# Patient Record
Sex: Female | Born: 1965 | Race: Black or African American | Hispanic: No | Marital: Married | Smoking: Never smoker
Health system: Southern US, Community
[De-identification: ages and names within clinical notes are randomized; demographics above are authoritative.]

## PROBLEM LIST (undated history)

## (undated) DIAGNOSIS — I10 Essential (primary) hypertension: Secondary | ICD-10-CM

## (undated) DIAGNOSIS — Z973 Presence of spectacles and contact lenses: Secondary | ICD-10-CM

## (undated) DIAGNOSIS — Z9289 Personal history of other medical treatment: Secondary | ICD-10-CM

## (undated) DIAGNOSIS — D649 Anemia, unspecified: Secondary | ICD-10-CM

## (undated) DIAGNOSIS — Z860100 Personal history of colon polyps, unspecified: Secondary | ICD-10-CM

## (undated) DIAGNOSIS — G709 Myoneural disorder, unspecified: Secondary | ICD-10-CM

## (undated) DIAGNOSIS — C50912 Malignant neoplasm of unspecified site of left female breast: Secondary | ICD-10-CM

## (undated) DIAGNOSIS — I701 Atherosclerosis of renal artery: Secondary | ICD-10-CM

## (undated) HISTORY — PX: TUBAL LIGATION: SHX77

## (undated) HISTORY — DX: Myoneural disorder, unspecified: G70.9

## (undated) HISTORY — PX: BREAST SURGERY: SHX581

## (undated) HISTORY — PX: COLONOSCOPY: SHX5424

## (undated) HISTORY — PX: COLONOSCOPY: SHX174

---
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
## 1999-06-10 ENCOUNTER — Other Ambulatory Visit: Admission: RE | Admit: 1999-06-10 | Discharge: 1999-06-10 | Payer: Self-pay | Admitting: Gynecology

## 1999-10-21 ENCOUNTER — Inpatient Hospital Stay (HOSPITAL_COMMUNITY): Admission: AD | Admit: 1999-10-21 | Discharge: 1999-10-24 | Payer: Self-pay | Admitting: Gynecology

## 1999-10-21 ENCOUNTER — Encounter (INDEPENDENT_AMBULATORY_CARE_PROVIDER_SITE_OTHER): Payer: Self-pay

## 1999-10-25 ENCOUNTER — Encounter: Admission: RE | Admit: 1999-10-25 | Discharge: 2000-01-23 | Payer: Self-pay | Admitting: Gynecology

## 1999-12-09 ENCOUNTER — Other Ambulatory Visit: Admission: RE | Admit: 1999-12-09 | Discharge: 1999-12-09 | Payer: Self-pay | Admitting: Gynecology

## 2000-01-24 ENCOUNTER — Encounter: Admission: RE | Admit: 2000-01-24 | Discharge: 2000-02-05 | Payer: Self-pay | Admitting: Gynecology

## 2000-05-19 ENCOUNTER — Encounter: Payer: Self-pay | Admitting: Emergency Medicine

## 2000-05-19 ENCOUNTER — Emergency Department (HOSPITAL_COMMUNITY): Admission: EM | Admit: 2000-05-19 | Discharge: 2000-05-19 | Payer: Self-pay | Admitting: Emergency Medicine

## 2000-09-24 ENCOUNTER — Emergency Department (HOSPITAL_COMMUNITY): Admission: EM | Admit: 2000-09-24 | Discharge: 2000-09-24 | Payer: Self-pay | Admitting: Emergency Medicine

## 2000-10-22 ENCOUNTER — Emergency Department (HOSPITAL_COMMUNITY): Admission: EM | Admit: 2000-10-22 | Discharge: 2000-10-22 | Payer: Self-pay | Admitting: Emergency Medicine

## 2000-11-30 ENCOUNTER — Other Ambulatory Visit: Admission: RE | Admit: 2000-11-30 | Discharge: 2000-11-30 | Payer: Self-pay | Admitting: Family Medicine

## 2000-12-22 ENCOUNTER — Emergency Department (HOSPITAL_COMMUNITY): Admission: EM | Admit: 2000-12-22 | Discharge: 2000-12-22 | Payer: Self-pay | Admitting: Emergency Medicine

## 2002-06-20 ENCOUNTER — Encounter: Payer: Self-pay | Admitting: Family Medicine

## 2002-06-20 ENCOUNTER — Encounter: Admission: RE | Admit: 2002-06-20 | Discharge: 2002-06-20 | Payer: Self-pay | Admitting: Family Medicine

## 2002-07-09 ENCOUNTER — Encounter: Payer: Self-pay | Admitting: Family Medicine

## 2002-07-09 ENCOUNTER — Encounter: Admission: RE | Admit: 2002-07-09 | Discharge: 2002-07-09 | Payer: Self-pay | Admitting: Family Medicine

## 2002-07-26 DIAGNOSIS — Z853 Personal history of malignant neoplasm of breast: Secondary | ICD-10-CM

## 2002-07-26 DIAGNOSIS — Z9289 Personal history of other medical treatment: Secondary | ICD-10-CM

## 2002-07-26 HISTORY — PX: MASTECTOMY: SHX3

## 2002-07-26 HISTORY — DX: Personal history of other medical treatment: Z92.89

## 2002-10-05 ENCOUNTER — Encounter (INDEPENDENT_AMBULATORY_CARE_PROVIDER_SITE_OTHER): Payer: Self-pay | Admitting: *Deleted

## 2002-10-05 ENCOUNTER — Ambulatory Visit (HOSPITAL_BASED_OUTPATIENT_CLINIC_OR_DEPARTMENT_OTHER): Admission: RE | Admit: 2002-10-05 | Discharge: 2002-10-05 | Payer: Self-pay | Admitting: Surgery

## 2002-10-17 ENCOUNTER — Encounter (HOSPITAL_COMMUNITY): Admission: RE | Admit: 2002-10-17 | Discharge: 2003-01-15 | Payer: Self-pay | Admitting: Surgery

## 2002-10-17 ENCOUNTER — Encounter: Payer: Self-pay | Admitting: Surgery

## 2002-10-18 ENCOUNTER — Encounter: Payer: Self-pay | Admitting: Surgery

## 2002-11-27 ENCOUNTER — Encounter: Payer: Self-pay | Admitting: Surgery

## 2002-11-30 ENCOUNTER — Encounter (INDEPENDENT_AMBULATORY_CARE_PROVIDER_SITE_OTHER): Payer: Self-pay | Admitting: *Deleted

## 2002-11-30 ENCOUNTER — Encounter: Payer: Self-pay | Admitting: Surgery

## 2002-11-30 ENCOUNTER — Inpatient Hospital Stay (HOSPITAL_COMMUNITY): Admission: RE | Admit: 2002-11-30 | Discharge: 2002-12-04 | Payer: Self-pay | Admitting: Surgery

## 2002-12-04 ENCOUNTER — Encounter: Payer: Self-pay | Admitting: Surgery

## 2002-12-28 ENCOUNTER — Encounter: Payer: Self-pay | Admitting: Oncology

## 2002-12-28 ENCOUNTER — Ambulatory Visit (HOSPITAL_COMMUNITY): Admission: RE | Admit: 2002-12-28 | Discharge: 2002-12-28 | Payer: Self-pay | Admitting: Oncology

## 2003-12-06 ENCOUNTER — Emergency Department (HOSPITAL_COMMUNITY): Admission: EM | Admit: 2003-12-06 | Discharge: 2003-12-06 | Payer: Self-pay | Admitting: Family Medicine

## 2004-02-10 ENCOUNTER — Ambulatory Visit (HOSPITAL_COMMUNITY): Admission: RE | Admit: 2004-02-10 | Discharge: 2004-02-10 | Payer: Self-pay | Admitting: Gastroenterology

## 2004-02-10 ENCOUNTER — Encounter (INDEPENDENT_AMBULATORY_CARE_PROVIDER_SITE_OTHER): Payer: Self-pay | Admitting: *Deleted

## 2004-02-10 ENCOUNTER — Encounter: Payer: Self-pay | Admitting: Internal Medicine

## 2004-05-30 ENCOUNTER — Ambulatory Visit: Payer: Self-pay | Admitting: Oncology

## 2004-10-09 ENCOUNTER — Ambulatory Visit: Payer: Self-pay | Admitting: Oncology

## 2004-12-04 ENCOUNTER — Ambulatory Visit: Payer: Self-pay | Admitting: Oncology

## 2005-04-09 ENCOUNTER — Ambulatory Visit: Payer: Self-pay | Admitting: Oncology

## 2005-11-21 ENCOUNTER — Ambulatory Visit: Payer: Self-pay | Admitting: Oncology

## 2006-01-13 ENCOUNTER — Ambulatory Visit: Payer: Self-pay | Admitting: Oncology

## 2007-08-30 ENCOUNTER — Ambulatory Visit: Payer: Self-pay | Admitting: Oncology

## 2007-08-30 LAB — CBC WITH DIFFERENTIAL/PLATELET
MCH: 29.7 pg (ref 26.0–34.0)
MCV: 90.2 fL (ref 81.0–101.0)
RBC: 4.28 10*6/uL (ref 3.70–5.32)
RDW: 12.4 % (ref 11.3–14.5)
WBC: 5.1 10*3/uL (ref 3.9–10.0)
lymph#: 2.2 10*3/uL (ref 0.9–3.3)

## 2007-08-30 LAB — COMPREHENSIVE METABOLIC PANEL
ALT: 13 U/L (ref 0–35)
AST: 15 U/L (ref 0–37)
BUN: 11 mg/dL (ref 6–23)
CO2: 25 mEq/L (ref 19–32)
Chloride: 106 mEq/L (ref 96–112)
Glucose, Bld: 93 mg/dL (ref 70–99)
Potassium: 3.9 mEq/L (ref 3.5–5.3)
Total Bilirubin: 0.2 mg/dL — ABNORMAL LOW (ref 0.3–1.2)
Total Protein: 7.5 g/dL (ref 6.0–8.3)

## 2007-08-30 LAB — FERRITIN: Ferritin: 19 ng/mL (ref 10–291)

## 2007-08-30 LAB — IRON AND TIBC: TIBC: 424 ug/dL (ref 250–470)

## 2007-08-30 LAB — CANCER ANTIGEN 27.29: CA 27.29: <4 U/mL (ref 0–39)

## 2007-08-30 LAB — LACTATE DEHYDROGENASE: LDH: 169 U/L (ref 94–250)

## 2007-12-11 ENCOUNTER — Ambulatory Visit: Payer: Self-pay | Admitting: Oncology

## 2008-01-08 ENCOUNTER — Encounter: Payer: Self-pay | Admitting: Internal Medicine

## 2008-01-08 ENCOUNTER — Ambulatory Visit: Payer: Self-pay | Admitting: Internal Medicine

## 2008-01-08 ENCOUNTER — Other Ambulatory Visit: Admission: RE | Admit: 2008-01-08 | Discharge: 2008-01-08 | Payer: Self-pay | Admitting: Internal Medicine

## 2008-01-08 LAB — CONVERTED CEMR LAB: Pap Smear: NORMAL

## 2008-01-09 LAB — CONVERTED CEMR LAB
ALT: 17 units/L (ref 0–35)
AST: 18 units/L (ref 0–37)
Alkaline Phosphatase: 85 units/L (ref 39–117)
BUN: 9 mg/dL (ref 6–23)
Basophils Absolute: 0 10*3/uL (ref 0.0–0.1)
Basophils Relative: 0.3 % (ref 0.0–1.0)
Bilirubin, Direct: 0.1 mg/dL (ref 0.0–0.3)
CO2: 29 meq/L (ref 19–32)
Calcium: 9.5 mg/dL (ref 8.4–10.5)
Cholesterol: 245 mg/dL (ref 0–200)
Eosinophils Absolute: 0.1 10*3/uL (ref 0.0–0.7)
Glucose, Bld: 88 mg/dL (ref 70–99)
HDL: 38.5 mg/dL — ABNORMAL LOW (ref >39.0)
Lymphocytes Relative: 28.9 % (ref 12.0–46.0)
MCHC: 34.5 g/dL (ref 30.0–36.0)
Neutro Abs: 4.6 10*3/uL (ref 1.4–7.7)
RDW: 11.9 % (ref 11.5–14.6)
Total Bilirubin: 0.5 mg/dL (ref 0.3–1.2)
Triglycerides: 178 mg/dL — ABNORMAL HIGH (ref 0–149)
VLDL: 36 mg/dL (ref 0–40)

## 2008-01-10 ENCOUNTER — Encounter (INDEPENDENT_AMBULATORY_CARE_PROVIDER_SITE_OTHER): Payer: Self-pay | Admitting: *Deleted

## 2008-01-15 ENCOUNTER — Encounter: Payer: Self-pay | Admitting: Internal Medicine

## 2008-01-16 ENCOUNTER — Encounter (INDEPENDENT_AMBULATORY_CARE_PROVIDER_SITE_OTHER): Payer: Self-pay | Admitting: *Deleted

## 2008-04-09 ENCOUNTER — Ambulatory Visit: Payer: Self-pay | Admitting: Internal Medicine

## 2008-04-09 LAB — CONVERTED CEMR LAB
Ketones, urine, test strip: NEGATIVE
Protein, U semiquant: NEGATIVE
Specific Gravity, Urine: 1.015
WBC Urine, dipstick: NEGATIVE

## 2008-04-10 ENCOUNTER — Encounter: Payer: Self-pay | Admitting: Internal Medicine

## 2008-04-15 ENCOUNTER — Telehealth (INDEPENDENT_AMBULATORY_CARE_PROVIDER_SITE_OTHER): Payer: Self-pay | Admitting: *Deleted

## 2008-05-02 ENCOUNTER — Telehealth (INDEPENDENT_AMBULATORY_CARE_PROVIDER_SITE_OTHER): Payer: Self-pay | Admitting: *Deleted

## 2008-05-02 ENCOUNTER — Ambulatory Visit: Payer: Self-pay | Admitting: Internal Medicine

## 2008-05-02 DIAGNOSIS — T8131XA Disruption of external operation (surgical) wound, not elsewhere classified, initial encounter: Secondary | ICD-10-CM | POA: Insufficient documentation

## 2008-05-07 ENCOUNTER — Encounter (INDEPENDENT_AMBULATORY_CARE_PROVIDER_SITE_OTHER): Payer: Self-pay | Admitting: *Deleted

## 2009-01-01 ENCOUNTER — Ambulatory Visit: Payer: Self-pay | Admitting: Internal Medicine

## 2009-01-01 DIAGNOSIS — K3189 Other diseases of stomach and duodenum: Secondary | ICD-10-CM

## 2009-01-01 DIAGNOSIS — R1013 Epigastric pain: Secondary | ICD-10-CM

## 2009-01-02 LAB — CONVERTED CEMR LAB
ALT: 16 units/L (ref 0–35)
Albumin: 3.7 g/dL (ref 3.5–5.2)
Alkaline Phosphatase: 99 units/L (ref 39–117)
Basophils Absolute: 0 10*3/uL (ref 0.0–0.1)
Basophils Relative: 0.5 % (ref 0.0–3.0)
Bilirubin, Direct: 0 mg/dL (ref 0.0–0.3)
Eosinophils Absolute: 0.1 10*3/uL (ref 0.0–0.7)
Iron: 78 ug/dL (ref 42–145)
Lipase: 11 units/L (ref 11.0–59.0)
Lymphocytes Relative: 31.7 % (ref 12.0–46.0)
Lymphs Abs: 2 10*3/uL (ref 0.7–4.0)
Monocytes Absolute: 0.4 10*3/uL (ref 0.1–1.0)
Neutro Abs: 3.8 10*3/uL (ref 1.4–7.7)
Neutrophils Relative %: 60 % (ref 43.0–77.0)
Saturation Ratios: 17.9 % — ABNORMAL LOW (ref 20.0–50.0)
Total Protein: 7.6 g/dL (ref 6.0–8.3)
Transferrin: 311.6 mg/dL (ref 212.0–360.0)

## 2009-01-06 ENCOUNTER — Telehealth (INDEPENDENT_AMBULATORY_CARE_PROVIDER_SITE_OTHER): Payer: Self-pay | Admitting: *Deleted

## 2009-02-01 ENCOUNTER — Encounter: Payer: Self-pay | Admitting: Internal Medicine

## 2009-02-07 ENCOUNTER — Encounter (INDEPENDENT_AMBULATORY_CARE_PROVIDER_SITE_OTHER): Payer: Self-pay | Admitting: *Deleted

## 2009-03-12 ENCOUNTER — Telehealth (INDEPENDENT_AMBULATORY_CARE_PROVIDER_SITE_OTHER): Payer: Self-pay | Admitting: *Deleted

## 2009-03-12 ENCOUNTER — Ambulatory Visit: Payer: Self-pay | Admitting: Internal Medicine

## 2009-03-12 DIAGNOSIS — K645 Perianal venous thrombosis: Secondary | ICD-10-CM | POA: Insufficient documentation

## 2009-03-21 ENCOUNTER — Encounter: Payer: Self-pay | Admitting: Internal Medicine

## 2009-03-21 ENCOUNTER — Telehealth: Payer: Self-pay | Admitting: Internal Medicine

## 2009-04-02 ENCOUNTER — Encounter (INDEPENDENT_AMBULATORY_CARE_PROVIDER_SITE_OTHER): Payer: Self-pay | Admitting: *Deleted

## 2009-04-23 ENCOUNTER — Ambulatory Visit: Payer: Self-pay | Admitting: Internal Medicine

## 2009-04-23 DIAGNOSIS — Z860100 Personal history of colon polyps, unspecified: Secondary | ICD-10-CM | POA: Insufficient documentation

## 2009-04-23 DIAGNOSIS — K648 Other hemorrhoids: Secondary | ICD-10-CM | POA: Insufficient documentation

## 2009-04-23 DIAGNOSIS — K5900 Constipation, unspecified: Secondary | ICD-10-CM | POA: Insufficient documentation

## 2010-05-28 ENCOUNTER — Telehealth (INDEPENDENT_AMBULATORY_CARE_PROVIDER_SITE_OTHER): Payer: Self-pay | Admitting: *Deleted

## 2010-08-17 ENCOUNTER — Encounter: Payer: Self-pay | Admitting: Internal Medicine

## 2010-08-27 NOTE — Progress Notes (Signed)
Summary: Bone density.CPX overdue  Phone Note Call from Patient Call back at [phone]   Summary of Call: Patient called the office requesting referral for bone density. I made patient aware that she is over due for CPX and patient declined to schedule appt, she would just like to do her bone check. Please advise.  Initial call taken by: Lucious Groves CMA,  May 28, 2010 9:51 AM  Follow-up for Phone Call        unfortunately, I haven't seen the patient for a checkup since 2009. She could ask for gynecologist (if she has one)  to order a bone density test or  schedule a physical with me Follow-up by: Jose E. Paz MD,  May 29, 2010 10:11 AM  Additional Follow-up for Phone Call Additional follow up Details #1::        Spoke w/ patient says she will have to call and set up cpx w/ Dr. Drue Novel when she checks her work schedule.......Marland KitchenDoristine Devoid CMA  May 29, 2010 2:58 PM

## 2010-12-11 NOTE — Op Note (Signed)
Paige Foster, Paige Foster                         ACCOUNT NO.:  [id number]   MEDICAL RECORD NO.:  [id number]                   PATIENT TYPE:  INP   LOCATION:  5742                                 FACILITY:  [facility]   PHYSICIAN:  Currie Paris, M.D.           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   OPERATION PERFORMED:  Left total mastectomy with blue dye injection and  sentinel node biopsy.   SURGEON:  Currie Paris, M.D.   ANESTHESIA:  General.   INDICATIONS FOR PROCEDURE:  The patient is a 45 year old woman with a  recently biopsied (incisional biopsy) large mass involving the upper outer  quadrant and extending over the upper inner quadrant of the left breast.  Mammogram and ultrasound evaluation had been previously thought to be  nonmalignant.  However, pathology showed diffuse DCIS.  After lengthy  discussion with the patient, we elected to proceed with total mastectomy  with reconstruction and sentinel node biopsy.   DESCRIPTION OF PROCEDURE:  The patient was seen in the holding area and had  no further questions.  She had already been marked by Dr. Shon Hough for  reconstruction.  The patient was taken to the operating room and after  satisfactory general anesthesia had been obtained, the marks were reinforced  and then she was prepped and draped with a Foley catheter and PAS hose being  utilized.  I made an elliptical incision trying to spare as much skin as  possible although she has a very large diameter nipple areolar complex.  Once the secure incision was made, I raised a skin flap medially to the edge  of the sternum, superiorly to the  clavicle and out into the axilla.  There  were numerous large vessels that had to be either cauterized extensively,  clamped, tied with suture ligatures or clipped.  Once I had this freed up  into the axilla.  I used the Neoprobe and identified a hot area and with a  little dissection revealed about a 1 cm soft node with a slight bluish tint.  At the very onset of anesthesia I injected 5mL of methylene blue mixture  subareolarly.  This node was excised and had counts up to about 500.  Once  it was gone, axillary counts dropped down to background range.  No other  blue nodes noted nor any palpable adenopathy noted other than this one  sentinel node.  It subsequently returned negative.   While waiting for this to come back, I made the inferior incision, raised  the inferior flap to the rectus then laterally to the latissimus.  We then  removed the breast starting medially superiorly and working laterally and  inferiorly.  Pealing the breast off of the muscle and once it got lateral to  the pectoralis freed it off of the lateral chest wall as well.  Bleeders  again were coagulated, tied or clipped.  Once this was out, we made sure  everything was dry.  The  estimated blood loss was  about .  I placed a moist pack and we then  waited for Dr. Shon Hough to come in to do his planned TRAM flap  reconstruction with left side reduction.   The patient tolerated the procedure well thus far.                                                Currie Paris, M.D.    CJS/MEDQ  D:  11/30/2002  T:  12/03/2002  Job:  [id number]

## 2010-12-11 NOTE — Op Note (Signed)
Paige Foster, Paige Foster                         ACCOUNT NO.:  [id number]   MEDICAL RECORD NO.:  [id number]                   PATIENT TYPE:  INP   LOCATION:  5742                                 FACILITY:  [facility]   PHYSICIAN:  Yaakov Guthrie. Shon Hough, M.D.           DATE OF BIRTH:  07/20/1966   DATE OF PROCEDURE:  11/30/2002  DATE OF DISCHARGE:                                 OPERATIVE REPORT   PROCEDURE PERFORMED:  Right breast reduction with left TRAM reconstruction.   SURGEON:  Yaakov Guthrie. Shon Hough, M.D.   ASSISTANT:  Halford Decamp, C.F.A. and P.A.C.   INDICATIONS FOR PROCEDURE:  The patient with history of left breast cancer,  biopsy proven, now being prepared for a left TRAM reconstruction and she has  severe macromastia of the right breast and is prepared now for a right  breast reduction.  All the procedures in detail have been explained to the  patient preoperatively.  The patient understands and consents to surgery  with no 100% guarantee of results.  All questions were answered  preoperatively.   DESCRIPTION OF PROCEDURE:  The patient, while in the operating room,  underwent left mastectomy by Dr. Jamey Ripa.  Previous prep had been done as  well as outlines for the sterile towels and drapes.  The patient comes to  the office the day before surgery for preoperative drawings, marking the  midline of the abdomen and chest to actually align the inframammary folds as  well as demarcation of the pedicles around the umbilicus.  The  reconstruction for the right breast was also outlined preoperatively for the  reduction using the inferior pedicle technique.  The right breast reduction  was done first.  The edges were injected with 0.25% Xylocaine with  epinephrine 1:400,000 concentration, a total of 150 mL.  The wounds were  scored with #15 blades and the skin over the inferior pedicle was de  epithelialized with #20 blades.  The medial and lateral fatty dermal  pedicles were incised to  underlying fascia.  Hemostasis was maintained again  with the Bovie unit on coagulation.  Next the keyhole area was also debulked  out laterally and more breast tissue was removed including some accessory  breast tissue for symmetry.  After proper hemostasis, the flaps were  transposed and stayed with 3-0 Prolene.  Subcutaneous closure was done with  3-0 Vicryl x2 layers then a running subcuticular stitch of 3-0 Vicryl, 5-0  Vicryl throughout the inverted T.  The wounds were cleansed.  They were  drained with #10 fully fluted Blake drain which was placed in the depths of  the wound and brought out to the lateral most portion of the incision and  secured with 3-0 Prolene.  Steri-Strips were applied.   Next, attention was drawn to the abdomen.  Incision was made above the belly  button in a flap panel.  Dissection was carried down to underlying fascia,  out laterally.  A dissection was carried out just lateral to the pedicles.  Incision was made in the lower part of the pedicle and taken out through the  skin and subcutaneous tissue, Scarpa's fascia to underlying inguinal  vessels.  They were controlled with Bovie unit on coagulation.  Next, the  dissection was carried out to just above the arcuate line and dissection was  then carried down to both rectus abdominal muscles.  Next, dissection was  carried from the area above the umbilicus up to the xiphoid process and  right and left upper coordinates.  Using a Doppler, we outlined the  auscultation of the superior rectus artery and we left fascia over the area  and took approximately one half of the muscle on each side of the bipedicle  flap.  We were able to dissect around the pedicles from the umbilicus down  to the rectus muscles.  They were clamped off, vessels clamped off with the  Bovie unit on coagulation.  We were then able to lift a flap with this blood  supply.  The defect was closed anteriorly and superiorly with a running   locking #1 Prolene.  The lower defect was unable to be closed completely,  therefore, a piece of Prolene mesh was placed over the defect and the sheet  was secured doubly with a running #1 Prolene.  It was soaked in bug juice,  and bug juice irrigated over the area for a proper setting of antibiotic  against infection.  An opening was made into the left inframammary area.  The flap was then brought through the flap gently, being careful not to kink  the vessels and the muscle.  Intraoperative Dopplers had been done prior to  that procedure showing auscultation all the way down through the flap. The  flap was then set and trimmed appropriately.  We de-epithelialized some part  of the flap superiorly and laterally since there was a good amount of skin  still left from the surgery.  This fitted nicely and I left a flap  approximately 25% larger than the right side to warrant for shrinkage.  Subcutaneous closure was done with 3-0 Vicryl x2 layers and then a running  subcuticular stitch of 3-0 Vicryl.  Skin staples have been placed to just  align the flap and we will keep those in about three or four days.  The  wounds were cleansed and the breast was drained with a #10 Blake drain which  was placed in the depths of the wound and brought out to the lateral most  portion of the flap as well.  Flap had excellent coloration and temperature  warmth.   Next, the patient was placed in a jackknife position. The skin flaps were  then brought together with 2-0 Monocryl x2 layers and a running subcuticular  tissue of 3-0 Monocryl. Wounds were drained with a large Hemovac drain which  was placed in the depths of the wound through the right and left lower  quadrant regions.  It was brought out through the pubic area and secured  with 3-0 Prolene.  After Steri-Strips were applied to all the areas, fluffy dressing was also applied, 4 x 4s, ABDs, Hypafix tape, light dressings to  each side.  She withstood the  procedures very well and estimated blood loss  250 mL.  There were no complications.  We will watch the patient carefully  postoperatively.  She presented with anemia of 9.4 preoperatively and may  need  to have blood.  We have discussed that with her preoperatively.  The  patient, after dressing had been placed and abdominal support, was then  taken to recovery in excellent condition.                                               Yaakov Guthrie. Shon Hough, M.D.    [name]  D:  12/03/2002  T:  12/04/2002  Job:  [id number]

## 2010-12-11 NOTE — Discharge Summary (Signed)
[facility]  Patient:    Paige Foster, Paige Foster                      MRN: [id number] Adm. Date:  [id number] Disc. Date: [id number] Attending:  Merrily Pew Dictator:   Antony Contras, N.P.                           Discharge Summary  DISCHARGE DIAGNOSES: 1. Intrauterine pregnancy at term. 2. History of prior cesarean section. 3. History of herpes simplex virus. 4. History of leiomyomata. 5. Delivery of viable infant.  PROCEDURES:  Low cervical transverse cesarean section with bilateral tubal sterilization.  HISTORY OF PRESENT ILLNESS:  The patient is a 45 year old gravida 4, para 1-0-2-1 with an LMP of July 2000, [facility] of October 25, 1999. Her prenatal course was complicated by the following risk factors. She had a previous low cervical transverse cesarean section, a history of HSV, positive GBS of previous pregnancy and leiomyomata.  PRENATAL LABORATORY DATA:  Blood type A-positive.  Rubella immune. RPR, hepatitis B, ______, HIV nonreactive.  MSAFP within normal limits.  GBS positive.  HOSPITAL COURSE:  The patient was admitted on October 22, 1999 for repeat low transverse cervical cesarean section and bilateral tubal sterilization.  This was performed under spinal anesthesia by Dr. Audie Box.  She was delivered of an Apgar 8 and 35 female infant weighing 7 pounds and 10 ounces.  Her postpartum/postoperative course was uncomplicated.  The patient remained afebrile with no difficulty voiding and was able to be discharged on her third postoperative day in satisfactory condition.  Postoperative CBC showed hemoglobin 9.5, hematocrit 28.3, platelets 249, WBC 8.0.  DISPOSITION:  The patient is to follow up at [facility] in six weeks.  She is to continue with her prenatal vitamins and iron.  Motrin and Tylox for pain as needed. DD:  11/24/99 TD:  11/26/99 Job: [id number]

## 2010-12-11 NOTE — Op Note (Signed)
NAME:  Paige Foster, Paige Foster                         ACCOUNT NO.:  [id number]   MEDICAL RECORD NO.:  [id number]                   PATIENT TYPE:  AMB   LOCATION:  ENDO                                 FACILITY:  [facility]   PHYSICIAN:  Anselmo Rod, M.D.               DATE OF BIRTH:  1966/05/07   DATE OF PROCEDURE:  02/10/2004  DATE OF DISCHARGE:                                 OPERATIVE REPORT   PROCEDURE PERFORMED:  Colonoscopy with snare polypectomy times one.   ENDOSCOPIST:  Charna Elizabeth, M.D.   INSTRUMENT USED:  Olympus video colonoscope.   INDICATIONS FOR PROCEDURE:  The patient is a 46 year old African-American  female with a personal history of breast cancer status post left mastectomy  undergoing a colonoscopy for rectal bleeding.  Rule out colonic polyps,  masses, etc.   PREPROCEDURE PREPARATION:  Informed consent was procured from the patient.  The patient was fasted for eight hours prior to the procedure and prepped  with a bottle of magnesium citrate and a gallon of GoLYTELY the night prior  to the procedure.   PREPROCEDURE PHYSICAL:  The patient had stable vital signs.  Neck supple.  Chest clear to auscultation.  S1 and S2 regular.  Abdomen soft with normal  bowel sounds.   DESCRIPTION OF PROCEDURE:  The patient was placed in left lateral decubitus  position and sedated with 90 mg of Demerol and 9 mg of Versed intravenously.  Once the patient was adequately sedated and maintained on low flow oxygen  and continuous cardiac monitoring, the Olympus video colonoscope was  advanced from the rectum to the cecum.  There was a large amount of solid  stool in the right colon.  Therefore visualization was inadequate.  There  was solid stool in the cecum as well and therefore the appendicular orifice  was not visualized.  This stool could not be suctioned.  Therefore no  attempt was made to do so.  A small sessile polyp was snared from the  proximal transverse colon.  There was a  small internal hemorrhoid seen on  retroflexion.  The patient tolerated the procedure well without immediate  complications.   IMPRESSION:  1. Small nonbleeding internal hemorrhoids.  2. Small polyp snared from proximal transverse colon, question lipoma.  3. Incomplete visualization of the right colon because of a large amount of     solid stool.   RECOMMENDATIONS:  1. Await pathology results.  2. Increase fluid and fiber in the diet.  3. Use stool softeners as needed.  4. Outpatient followup in the next two weeks for further recommendations.                                               Anselmo Rod, M.D.    JNM/MEDQ  D:  02/10/2004  T:  02/10/2004  Job:  [id number]   cc:   Gabriel Earing, M.D.  [address]  [place]  [place] [address]  Fax: [phone]   Pierce Crane, M.D.  [address] [facility]  [place]  [place] [address]  Fax: [phone]   Currie Paris, M.D.  [address]  [place]  [place] [address]  Fax: [phone]

## 2010-12-11 NOTE — Op Note (Signed)
[facility]  Patient:    Paige Foster, Paige Foster                      MRN: [id number] Proc. Date: 10/21/99 Adm. Date:  [id number] Attending:  Merrily Pew                           Operative Report  PREOPERATIVE DIAGNOSES:       1. Pregnancy at term.                               2. Prior cesarean section.  Desires repeat cesarean                                  section.                               3. Multiparous.  Desires permanent sterilization.                               4. History of HSV type 2.                               5. Age 45 at delivery.                               6. History of leiomyomata.  POSTOPERATIVE DIAGNOSES:      Same.  PROCEDURE:                    Repeat low transverse cervical cesarean section, bilateral tubal sterilization, modified Pomeroy technique.  ANESTHESIA:                   Spinal.  ESTIMATED BLOOD LOSS:         500 cc.  COMPLICATIONS:                None.  SPECIMEN:                     Samples of cord blood, portions of right and portions of left fallopian tube.  FINDINGS:                     At 12:53 normal female infant.  Apgars 8 and 9. Weight 7 pounds 10 ounces.  Small 2 cm anterior mid intramural myoma noted left undisturbed.  PROCEDURE:                    Patient was taken to the operating room. Underwent spinal anesthesia.  Was placed in the left tilt supine position.  Received an abdominal preparation with Betadine scrub and Betadine solution.  A Foley catheter was placed in a sterile technique and the patient was draped in the usual fashion. After assuring adequate anesthesia, the abdomen was sharply entered through a repeat Pfannenstiel incision achieving adequate hemostasis of all levels.  The bladder flap was then sharply and bluntly developed without difficulty.  The uterus was sharply entered in the lower uterine segment and bluntly extended laterally. The bulging membranes  were ruptured.  The fluid noted to be clear.  The infants  head delivered through the incision with the assistance of the vacuum extractor. The nares were mild suctioned.  The rest of the infant delivered.  The cord doubly clamped and cut and infant handed to pediatrics in attendance.  Samples of cord  blood were then obtained.  The placenta was then spontaneously extruded, noted o be intact.  The uterus was exteriorized and the endometrial cavity was explored  with a sponge to remove all placental and membrane fragments.  The uterus incision was then closed in one layer using 0 Vicryl suture in a running interlocking stitch.  The left fallopian tube was then identified, traced from its insertion to its fimbriated end and a mid tubal segment was doubly ligated with 0 plain suture and sharply excised.  Tubal lumen as well as adequate hemostasis was grossly visualized.  A similar procedure was then carried out on the other side.  The uterus was then returned to the abdomen which was copiously irrigated showing adequate hemostasis.  The right and left tubal sites were inspected showing adequate hemostasis and the anterior fascia was then reapproximated using 0 Vicryl suture in a running stitch.  The subcutaneous tissues were irrigated and hemostasis was achieved using electrocautery.  The incision line copiously irrigated.  The  skin was reapproximated with staples.  A sterile dressing was applied.  Patient was taken to the recovery room in good condition having tolerated procedure well. DD:  10/21/99 TD:  10/21/99 Job: [id number]

## 2010-12-11 NOTE — H&P (Signed)
[facility]  Patient:    Paige Foster                         MRN: [id number] Adm. Date:  [id number] Disc. Date: [id number] Attending:  Merrily Pew                         History and Physical  CHIEF COMPLAINT:              1. Pregnancy at term.                               2. History of prior cesarean section, desires  repeat cesarean section and tubal sterilization.                               3. History of HSV type 2 age 53 at delivery.                               4. History of positive beta strep with prior                                  pregnancy.                               5. History of leiomyomata.  HISTORY OF PRESENT ILLNESS:   A 45 year old G72, P78 female at term gestation with history of prior cesarean section for failure to progress who desires repeat cesarean section, tubal sterilization after counseling for trial of labor.  PAST MEDICAL HISTORY:         Uncomplicated.  PAST SURGICAL HISTORY:        Cesarean section.  ALLERGIES:                    None.  MEDICATIONS:                  Vitamins.  FAMILY HISTORY:               Noncontributory.  SOCIAL HISTORY:               No cigarettes or alcohol use.  REVIEW OF SYSTEMS:            Noncontributory.  PHYSICAL EXAMINATION:  HEENT:                        Normal.  LUNGS:                        Clear.  CARDIAC:                      Regular rate.  No rubs, murmurs, or gallops.  ABDOMEN:                      Benign with gravid vertex presentation.  Positive  fetal heart tones.  PELVIC:                       Deferred.  ASSESSMENT:  A 45 year old G64, P49 female.  History of prior c-section for failure to progress who desires repeat cesarean section, bilateral tubal sterilization.  Risks, benefits, indications, and alternatives were discussed with her to include trial of labor of which the patient declines and wants to proceed with repeat  cesarean section.  What is involved with repeat c-section was discussed and the risks were reviewed to include the risks of wound complications such as infection, hematoma, seromas leading to opening and draining of incisions, and prolonged antibiotics with closure by secondary intention.  Risks of major vessel injury leading to hemorrhage and necessitating transfusion and the risks of transfusion up to and including HIV were all discussed, understood, and accepted. The risks of inadvertent injury to internal organs including bowel, bladder, ureters, vessels, and nerves necessitating major exploratory repairative surgery and future repairative surgeries including ostomy formation was discussed, understood, and accepted.  The permanency as well as the potential for failure or tubal sterilization was discussed with her.  She clearly understands this and wants to proceed with tubal sterilization, accepting the possibility of failure if indeed it occurs.  The patients questions were all answered to her satisfaction and she is ready to proceed with surgery. DD:  10/19/99 TD:  10/19/99 Job: [id number]

## 2010-12-11 NOTE — Discharge Summary (Signed)
   [name], Foster                         ACCOUNT NO.:  [id number]   MEDICAL RECORD NO.:  [id number]                   PATIENT TYPE:  INP   LOCATION:  5742                                 FACILITY:  [facility]   PHYSICIAN:  Yaakov Guthrie. Shon Hough, M.D.           DATE OF BIRTH:  28-Sep-1965   DATE OF ADMISSION:  11/30/2002  DATE OF DISCHARGE:  12/04/2002                                 DISCHARGE SUMMARY   HISTORY OF PRESENT ILLNESS:  The patient is a 45 year old lady admitted to  the hospital for reconstruction of her left breast and also right breast  reduction.  The patient has breast cancer and is now being prepared for  reconstruction and symmetry of balance.   HOSPITAL COURSE:  She underwent right breast reduction as well as left TRAM  flap on Nov 30, 2002, and has done well postoperatively.  The patient should  be discharged from the hospital on Dec 04, 2002.   DISCHARGE DIAGNOSIS:  As above.   CONDITION ON DISCHARGE:  Improved.   COMPLICATIONS:  None.   MEDICATIONS:  1. Lortab one to two tablets by mouth every four hours as needed for pain,     #30.  2. Duricef 500 mg twice a day for five days.  3. Phenergan 25 mg p.o. q.6h. p.r.n. for nausea.   FOLLOW UP:  The patient will follow up in my office in approximately one  week.   DISCHARGE INSTRUCTIONS:  Discharge instructions were given and the patient  will be instructed on how to empty drains and measure that for me on a daily  basis.  All questions were answered postoperatively.  Medications given or  prescriptions.  She is to call for any medical problems.                                               Yaakov Guthrie. Shon Hough, M.D.    [name]  D:  12/03/2002  T:  12/04/2002  Job:  [id number]

## 2010-12-11 NOTE — Op Note (Signed)
Paige Foster, Paige Foster                         ACCOUNT NO.:  [id number]   MEDICAL RECORD NO.:  [id number]                   PATIENT TYPE:  AMB   LOCATION:  [facility]SC                                  FACILITY:  [facility]   PHYSICIAN:  Currie Paris, M.D.           DATE OF BIRTH:  Oct 14, 1965   DATE OF PROCEDURE:  10/05/2002  DATE OF DISCHARGE:                                 OPERATIVE REPORT   MEDICAL RECORD NUMBER:  [id number]   PREOPERATIVE DIAGNOSIS:  Mass, left breast.   POSTOPERATIVE DIAGNOSIS:  Mass, left breast.   OPERATION:  Left breast biopsy.   SURGEON:  Currie Paris, M.D.   ANESTHESIA:  General.   INDICATIONS:  The patient is a 45 year old with an asymmetric thickening  across the entire 12 o'clock to the 3 o'clock position of  her left breast  which was firm, did not feel stony hard. It was nodular with the remaining  of the breast being fairly soft. Mammograms and ultrasound did not show a  mass, although I thought there was some increased density of the left side  compared to the right. However, because of the asymmetric examination, we  elected to proceed to an incisional biopsy. I thought that taking this  whole area out would essentially be a partial mastectomy, and if this was  malignant we would be unlikely to get negative margins.   DESCRIPTION OF PROCEDURE:  The patient was in the holding area and had no  further questions. She was taken to the operating room and the area in  question was identified and marked. She then underwent general anesthesia.  The breast was prepped and draped.   I elected to make a circumareolar incision at the areolar margin because  this area extended down to the areola, although it was all above the areola;  this was the inferior margin of it. I thought that this would reduce  scarring. I raised the skin flap superiorly and could well palpate this area  of very hard breast tissue.   Once we got closer to it, it was clear  that this was very hard, irregular  tissue, and using the cutting current of the cautery I excised it. There  were multiple cysts in it, but it was also very vascular and so the fairly  large blood vessels had to be coagulated.   I took out a several centimeter section which I thought would be adequate  for diagnostic purposes without causing any breast deformity. I used some  Marcaine to help with postoperative analgesia and then closed with some 3-0  Vicryl followed by 4-0 Monocryl  subcuticular and Steri-Strips.   The patient tolerated the procedure well. There were no intraoperative  complications and all counts were correct.  Currie Paris, M.D.    CJS/MEDQ  D:  10/05/2002  T:  10/05/2002  Job:  [id number]   cc:   [facility], M.D[facility]  [address] [place]  [place] [address]  Fax: [phone]

## 2012-05-26 ENCOUNTER — Encounter: Payer: Self-pay | Admitting: Internal Medicine

## 2012-05-26 ENCOUNTER — Ambulatory Visit (INDEPENDENT_AMBULATORY_CARE_PROVIDER_SITE_OTHER): Payer: Managed Care, Other (non HMO) | Admitting: Internal Medicine

## 2012-05-26 ENCOUNTER — Other Ambulatory Visit (HOSPITAL_COMMUNITY)
Admission: RE | Admit: 2012-05-26 | Discharge: 2012-05-26 | Disposition: A | Discharge status: Home / Self Care | Payer: Managed Care, Other (non HMO) | Source: Ambulatory Visit | Attending: Internal Medicine | Admitting: Internal Medicine

## 2012-05-26 VITALS — BP 128/84 | HR 51 | Temp 98.4°F | Ht 64.0 in | Wt 167.0 lb

## 2012-05-26 DIAGNOSIS — Z01419 Encounter for gynecological examination (general) (routine) without abnormal findings: Secondary | ICD-10-CM | POA: Insufficient documentation

## 2012-05-26 DIAGNOSIS — E28319 Asymptomatic premature menopause: Secondary | ICD-10-CM

## 2012-05-26 DIAGNOSIS — Z1231 Encounter for screening mammogram for malignant neoplasm of breast: Secondary | ICD-10-CM

## 2012-05-26 DIAGNOSIS — Z853 Personal history of malignant neoplasm of breast: Secondary | ICD-10-CM

## 2012-05-26 DIAGNOSIS — Z0000 Encounter for general adult medical examination without abnormal findings: Secondary | ICD-10-CM

## 2012-05-26 NOTE — Assessment & Plan Note (Signed)
Diagnosed with breast cancer in 2000, loss oncology followup 3 years ago. Will refer to oncology for evaluation and see if she needs anything else

## 2012-05-26 NOTE — Progress Notes (Signed)
  Subjective:    Patient ID: Paige Foster, female    DOB: Dec 08, 1965, 46 y.o.   MRN: [id number]  HPI CPX, new pt, last visit >  3 years ago   Past Medical History: Breast cancer, hx of (07/26/2002)--s/p surgery , had Tamoxifen up until 2007 per pt (no XRT-Chemo); oncologist  Dr Donnie Coffin G2 P2 Dr Loreta Ave -- incomplete Cscope 2005, hyperplastic polyp x 1  Past Surgical History: Mastectomy (2004) (left) and  a flap procedure (per patient had an abdominoplasty at the same time to do the flap) Right breast reduction tubal ligation  Social History: Married, 2 children Tobacco--no ETOH-- no Occupation-- works at a [employer] Diet-- trying to eat better lately  Exercise-- no frequently, active at work  Family History: CAD - M (CABG age ~ late 93s) HTN - M DM - M stroke - no colon Ca - no Breast Ca - no ovarian/uterine Ca - no M deceased blood clot   Review of Systems Chest pain or shortness of breath No nausea, vomiting, diarrhea. No vaginal discharge or vaginal bleeding. Last menstrual period 5 years ago, she does have some hot flashes.     Objective:   Physical Exam General -- alert, well-developed .   Neck --no thyromegaly Breasts--  No LADs on either axillar area Right breast, status post reduction, no dominant mass. Left breast, status post reconstruction, no masses that  I can tell Lungs -- normal respiratory effort, no intercostal retractions, no accessory muscle use, and normal breath sounds.   Heart-- normal rate, regular rhythm, no murmur, and no gallop.   Abdomen--soft, non-tender, no distention, no masses, no HSM, no guarding, and no rigidity.  Well-healed surgical scar GU-- External examination normal, vagina without discharge or lesions. Cervix seems normal. Bilateral exam without mass or discomfort. Neurologic-- alert & oriented X3 and strength normal in all extremities. Psych-- Cognition and judgment appear intact. Alert and cooperative with  normal attention span and concentration.  not anxious appearing and not depressed appearing.       Assessment & Plan:

## 2012-05-26 NOTE — Assessment & Plan Note (Addendum)
Td 2006 Flu shot--  Declined today Mammogram:last documented B MMG 2010, (-) Breast exam today unremarkable, schedule a mammogram Colonoscopy: 01-2004, hyperplastic polyp, Dr. Loreta Ave; [facility] notes reviewed--they recommended a colonoscopy, referral done Diet and exercise discussed Scheduled DEXA,  Menopausal x 5 years

## 2012-05-26 NOTE — Patient Instructions (Addendum)
Please come back fasting: FLP, CMP, TSH, CBC, vitamin D --- dx V70

## 2012-05-29 ENCOUNTER — Other Ambulatory Visit (INDEPENDENT_AMBULATORY_CARE_PROVIDER_SITE_OTHER): Payer: Managed Care, Other (non HMO)

## 2012-05-29 DIAGNOSIS — Z0000 Encounter for general adult medical examination without abnormal findings: Secondary | ICD-10-CM

## 2012-05-29 LAB — COMPREHENSIVE METABOLIC PANEL
AST: 16 U/L (ref 0–37)
Alkaline Phosphatase: 82 U/L (ref 39–117)
GFR: 120.93 mL/min (ref >60.00)
Potassium: 3.4 mEq/L — ABNORMAL LOW (ref 3.5–5.1)
Total Bilirubin: 0.3 mg/dL (ref 0.3–1.2)

## 2012-05-29 LAB — CBC WITH DIFFERENTIAL/PLATELET
Basophils Absolute: 0 10*3/uL (ref 0.0–0.1)
Eosinophils Absolute: 0 10*3/uL (ref 0.0–0.7)
HCT: 38.9 % (ref 36.0–46.0)
Hemoglobin: 12.9 g/dL (ref 12.0–15.0)
Lymphs Abs: 1.7 10*3/uL (ref 0.7–4.0)
MCHC: 33.2 g/dL (ref 30.0–36.0)
MCV: 92.2 fl (ref 78.0–100.0)
Monocytes Relative: 6.7 % (ref 3.0–12.0)
Neutrophils Relative %: 61.1 % (ref 43.0–77.0)
RBC: 4.22 Mil/uL (ref 3.87–5.11)
RDW: 12.7 % (ref 11.5–14.6)

## 2012-05-29 LAB — LIPID PANEL
Cholesterol: 225 mg/dL — ABNORMAL HIGH (ref 0–200)
Total CHOL/HDL Ratio: 6
Triglycerides: 127 mg/dL (ref 0.0–149.0)

## 2012-05-29 LAB — TSH: TSH: 0.52 u[IU]/mL (ref 0.35–5.50)

## 2012-05-30 ENCOUNTER — Telehealth: Payer: Self-pay

## 2012-05-30 NOTE — Telephone Encounter (Signed)
Pt called left message on triage line stating want results. I called pt back to advise labs haven't been resulted yet, no answer/no machine couldn't leave message.      MW

## 2012-05-31 NOTE — Telephone Encounter (Signed)
Pt called again and left message on triage line asking about lab results. I called pt back again to advise labs have not been resulted yet and we will either call or mail results.  Pt stated understanding.       MW

## 2012-06-01 ENCOUNTER — Encounter: Payer: Self-pay | Admitting: *Deleted

## 2012-06-01 ENCOUNTER — Encounter: Payer: Self-pay | Admitting: Internal Medicine

## 2012-06-02 ENCOUNTER — Other Ambulatory Visit: Payer: Self-pay | Admitting: *Deleted

## 2012-06-02 DIAGNOSIS — Z853 Personal history of malignant neoplasm of breast: Secondary | ICD-10-CM

## 2012-06-03 LAB — VITAMIN D 1,25 DIHYDROXY: Vitamin D2 1, 25 (OH)2: <8 pg/mL

## 2012-06-16 ENCOUNTER — Encounter: Payer: Self-pay | Admitting: Internal Medicine

## 2012-06-23 ENCOUNTER — Telehealth: Payer: Self-pay | Admitting: *Deleted

## 2012-06-23 NOTE — Telephone Encounter (Signed)
Left message for pt to return my call so I can see if she is able to see Dr. Donnie Coffin on 12/4.

## 2012-06-26 ENCOUNTER — Encounter: Payer: Self-pay | Admitting: Internal Medicine

## 2012-06-27 ENCOUNTER — Telehealth: Payer: Self-pay | Admitting: *Deleted

## 2012-06-27 ENCOUNTER — Ambulatory Visit: Payer: Managed Care, Other (non HMO) | Admitting: Oncology

## 2012-06-27 ENCOUNTER — Ambulatory Visit: Payer: Managed Care, Other (non HMO)

## 2012-06-27 ENCOUNTER — Other Ambulatory Visit: Payer: Managed Care, Other (non HMO) | Admitting: Lab

## 2012-06-27 NOTE — Telephone Encounter (Signed)
Patient is off today and I left another message at home for her to please call me so I can reschedule her appt for today.

## 2012-06-27 NOTE — Telephone Encounter (Signed)
Patient returned my call and wants to wait until the 1st of the year due to work and I confirmed 08/17/12 appt w/ pt.  Mailed new appt letter to pt.

## 2012-07-04 ENCOUNTER — Telehealth: Payer: Self-pay | Admitting: Internal Medicine

## 2012-07-04 NOTE — Telephone Encounter (Signed)
In reference to Gastroenterology referral entered on 05/26/12, as of today patient is not scheduled.  [facility], and I have left messages for patient, I also mailed patient a letter.  No response from patient.

## 2012-07-04 NOTE — Telephone Encounter (Signed)
Noted I'll have to discuss this w/ the pt on RTC

## 2012-07-21 ENCOUNTER — Telehealth: Payer: Self-pay | Admitting: *Deleted

## 2012-07-21 NOTE — Telephone Encounter (Signed)
patient confirmed over the phone the appointment has been cancelled

## 2012-08-10 ENCOUNTER — Encounter: Payer: Self-pay | Admitting: *Deleted

## 2012-08-10 NOTE — Progress Notes (Signed)
Spoke to Dr. Drue Novel nurse a  to inform Dr. Drue Novel that we have been unable to reach the pt to inform her that Dr. Donnie Coffin is no longer at the [facility].  Judeth Cornfield stated that she would try to reach the pt.  Tawni Carnes my contact information to give the pt to call me back to r/s appt with different med onc.

## 2012-08-17 ENCOUNTER — Ambulatory Visit: Payer: Managed Care, Other (non HMO) | Admitting: Oncology

## 2012-08-17 ENCOUNTER — Telehealth: Payer: Self-pay | Admitting: *Deleted

## 2012-08-17 ENCOUNTER — Other Ambulatory Visit: Payer: Managed Care, Other (non HMO) | Admitting: Lab

## 2012-08-17 ENCOUNTER — Ambulatory Visit: Payer: Managed Care, Other (non HMO)

## 2012-08-17 NOTE — Telephone Encounter (Signed)
Left message for pt to return my call so I can schedule a Med Onc appt. 

## 2012-09-02 ENCOUNTER — Telehealth: Payer: Self-pay | Admitting: *Deleted

## 2012-09-02 NOTE — Telephone Encounter (Signed)
Per patient reassignment I have contact the patient. I have explained that Dr. Donnie Coffin is no longer with the practice, but reviewed her chart and wants Dr. Welton Flakes to follow her care. Patient stated that she will have to call back when she knows her days off. I have told her to ask for [facility]. JMW

## 2013-01-09 ENCOUNTER — Telehealth: Payer: Self-pay | Admitting: *Deleted

## 2013-01-09 NOTE — Telephone Encounter (Signed)
Left message for pt to return my call so I can get her scheduled w/ a new provider since Dr. Donnie Coffin is no longer here.

## 2013-06-07 ENCOUNTER — Encounter: Payer: Self-pay | Admitting: Nurse Practitioner

## 2013-06-07 ENCOUNTER — Ambulatory Visit (INDEPENDENT_AMBULATORY_CARE_PROVIDER_SITE_OTHER): Payer: Managed Care, Other (non HMO) | Admitting: Nurse Practitioner

## 2013-06-07 VITALS — BP 170/110 | HR 82 | Temp 98.5°F | Ht 64.0 in | Wt 161.2 lb

## 2013-06-07 DIAGNOSIS — R030 Elevated blood-pressure reading, without diagnosis of hypertension: Secondary | ICD-10-CM

## 2013-06-07 DIAGNOSIS — R059 Cough, unspecified: Secondary | ICD-10-CM

## 2013-06-07 DIAGNOSIS — IMO0001 Reserved for inherently not codable concepts without codable children: Secondary | ICD-10-CM

## 2013-06-07 DIAGNOSIS — J019 Acute sinusitis, unspecified: Secondary | ICD-10-CM

## 2013-06-07 DIAGNOSIS — R05 Cough: Secondary | ICD-10-CM

## 2013-06-07 MED ORDER — AMOXICILLIN-POT CLAVULANATE 875-125 MG PO TABS: 1.0000 | ORAL_TABLET | Freq: Two times a day (BID) | ORAL | Status: DC

## 2013-06-07 MED ORDER — BENZONATATE 100 MG PO CAPS: ORAL_CAPSULE | ORAL | Status: DC

## 2013-06-07 MED ORDER — AMOXICILLIN-POT CLAVULANATE 875-125 MG PO TABS
1.0000 | ORAL_TABLET | Freq: Two times a day (BID) | ORAL | Status: DC
Start: 2013-06-07 — End: 2013-06-07

## 2013-06-07 NOTE — Patient Instructions (Addendum)
I am treating you for bacterial sinusitis. Start antibiotic. Eat yogurt daily while taking antibiotic. Rest. Sip fluids throughout the day. Take benzonatate capsules to help you cough less. Also, a spoonful of honey will help you cough less. Start using Neilmed Sinus Rinse daily to clear sinuses-this will help you cough less. Your blood pressure is high today. I think it is due to taking cold medicines. Stop taking cough & cold medicines. Please come back next week to re-evaluate blood pressure.  Sinusitis Sinusitis is redness, soreness, and swelling (inflammation) of the paranasal sinuses. Paranasal sinuses are air pockets within the bones of your face (beneath the eyes, the middle of the forehead, or above the eyes). In healthy paranasal sinuses, mucus is able to drain out, and air is able to circulate through them by way of your nose. However, when your paranasal sinuses are inflamed, mucus and air can become trapped. This can allow bacteria and other germs to grow and cause infection. Sinusitis can develop quickly and last only a short time (acute) or continue over a long period (chronic). Sinusitis that lasts for more than 12 weeks is considered chronic.  CAUSES  Causes of sinusitis include:  Allergies.  Structural abnormalities, such as displacement of the cartilage that separates your nostrils (deviated septum), which can decrease the air flow through your nose and sinuses and affect sinus drainage.  Functional abnormalities, such as when the small hairs (cilia) that line your sinuses and help remove mucus do not work properly or are not present. SYMPTOMS  Symptoms of acute and chronic sinusitis are the same. The primary symptoms are pain and pressure around the affected sinuses. Other symptoms include:  Upper toothache.  Earache.  Headache.  Bad breath.  Decreased sense of smell and taste.  A cough, which worsens when you are lying flat.  Fatigue.  Fever.  Thick drainage from  your nose, which often is green and may contain pus (purulent).  Swelling and warmth over the affected sinuses. DIAGNOSIS  Your caregiver will perform a physical exam. During the exam, your caregiver may:  Look in your nose for signs of abnormal growths in your nostrils (nasal polyps).  Tap over the affected sinus to check for signs of infection.  View the inside of your sinuses (endoscopy) with a special imaging device with a light attached (endoscope), which is inserted into your sinuses. If your caregiver suspects that you have chronic sinusitis, one or more of the following tests may be recommended:  Allergy tests.  Nasal culture A sample of mucus is taken from your nose and sent to a lab and screened for bacteria.  Nasal cytology A sample of mucus is taken from your nose and examined by your caregiver to determine if your sinusitis is related to an allergy. TREATMENT  Most cases of acute sinusitis are related to a viral infection and will resolve on their own within 10 days. Sometimes medicines are prescribed to help relieve symptoms (pain medicine, decongestants, nasal steroid sprays, or saline sprays).  However, for sinusitis related to a bacterial infection, your caregiver will prescribe antibiotic medicines. These are medicines that will help kill the bacteria causing the infection.  Rarely, sinusitis is caused by a fungal infection. In theses cases, your caregiver will prescribe antifungal medicine. For some cases of chronic sinusitis, surgery is needed. Generally, these are cases in which sinusitis recurs more than 3 times per year, despite other treatments. HOME CARE INSTRUCTIONS   Drink plenty of water. Water helps thin the  mucus so your sinuses can drain more easily.  Use a humidifier.  Inhale steam 3 to 4 times a day (for example, sit in the bathroom with the shower running).  Apply a warm, moist washcloth to your face 3 to 4 times a day, or as directed by your  caregiver.  Use saline nasal sprays to help moisten and clean your sinuses.  Take over-the-counter or prescription medicines for pain, discomfort, or fever only as directed by your caregiver. SEEK IMMEDIATE MEDICAL CARE IF:  You have increasing pain or severe headaches.  You have nausea, vomiting, or drowsiness.  You have swelling around your face.  You have vision problems.  You have a stiff neck.  You have difficulty breathing. MAKE SURE YOU:   Understand these instructions.  Will watch your condition.  Will get help right away if you are not doing well or get worse. Document Released: 07/12/2005 Document Revised: 10/04/2011 Document Reviewed: 07/27/2011 [facility] Patient Information 2014 [place], [place].

## 2013-06-07 NOTE — Progress Notes (Signed)
Pre-visit discussion using our clinic review tool. No additional management support is needed unless otherwise documented below in the visit note.  

## 2013-06-07 NOTE — Progress Notes (Signed)
  Subjective:    Patient ID: Paige Foster, female    DOB: [birth year], 47 y.o.   MRN: [id number]  Cough This is a chronic problem. The current episode started 1 to 4 weeks ago (2w). The problem has been gradually worsening. The problem occurs hourly (keeping awake). The cough is productive of sputum. Associated symptoms include ear congestion, ear pain, nasal congestion, postnasal drip and a sore throat. Pertinent negatives include no chest pain, chills, eye redness, fever, headaches, rash, shortness of breath or wheezing. The symptoms are aggravated by lying down. She has tried OTC cough suppressant for the symptoms. The treatment provided no relief. There is no history of asthma, bronchitis or COPD.      Review of Systems  Constitutional: Positive for fatigue. Negative for fever, chills, activity change and appetite change.  HENT: Positive for congestion, ear pain, postnasal drip and sore throat.   Eyes: Negative for redness.  Respiratory: Positive for cough. Negative for shortness of breath and wheezing.   Cardiovascular: Negative for chest pain, palpitations and leg swelling.  Gastrointestinal: Negative for abdominal pain and abdominal distention.  Musculoskeletal: Negative for back pain.  Skin: Negative for color change and rash.  Neurological: Negative for dizziness, numbness and headaches.  Hematological: Negative for adenopathy.       Objective:   Physical Exam  Vitals reviewed. Constitutional: She is oriented to person, place, and time. She appears well-developed and well-nourished. No distress.  Elevated bp X 2 ofc readings. Pt has been taking robitussin DM. 1 prior elevated bp reading in ofc, several yrs ago-diastolic 100. Denies HA, facial tingling/numbness, nose bleeds, LE edema.  HENT:  Head: Normocephalic and atraumatic.  Right Ear: External ear normal. Tympanic membrane is not injected, not retracted and not bulging. A middle ear effusion is present.  Left Ear:  External ear normal. Tympanic membrane is not injected, not retracted and not bulging. A middle ear effusion is present.  Nose: Mucosal edema and rhinorrhea present.    Mouth/Throat: No oropharyngeal exudate.  Eyes: Conjunctivae are normal. Right eye exhibits no discharge. Left eye exhibits no discharge.  Neck: Normal range of motion. Neck supple. No thyromegaly present.  Cardiovascular: Normal rate, regular rhythm and normal heart sounds.   No murmur heard. Pulmonary/Chest: Breath sounds normal. No respiratory distress. She has no wheezes.  Lymphadenopathy:    She has no cervical adenopathy.  Neurological: She is alert and oriented to person, place, and time.  Skin: Skin is warm and dry.  Psychiatric: She has a normal mood and affect. Her behavior is normal.          Assessment & Plan:  1. Sinusitis, acute See pt instructions. - amoxicillin-clavulanate (AUGMENTIN) 875-125 MG per tablet; Take 1 tablet by mouth 2 (two) times daily.  Dispense: 10 tablet; Refill: 0  2. Cough Worse at night. - benzonatate (TESSALON) 100 MG capsule; Take 1-2 capsules po up to 3 times daily PRN cough  Dispense: 60 capsule; Refill: 0  3. Elevated blood pressure Stop OTC cold & cough products. F/u next week for bp eval. Needs CPE.

## 2013-07-10 ENCOUNTER — Encounter: Payer: Self-pay | Admitting: Internal Medicine

## 2013-07-10 ENCOUNTER — Ambulatory Visit (INDEPENDENT_AMBULATORY_CARE_PROVIDER_SITE_OTHER): Payer: Managed Care, Other (non HMO) | Admitting: Internal Medicine

## 2013-07-10 VITALS — BP 186/115 | HR 97 | Temp 98.2°F | Wt 163.0 lb

## 2013-07-10 DIAGNOSIS — R0989 Other specified symptoms and signs involving the circulatory and respiratory systems: Secondary | ICD-10-CM

## 2013-07-10 DIAGNOSIS — Z23 Encounter for immunization: Secondary | ICD-10-CM

## 2013-07-10 DIAGNOSIS — I10 Essential (primary) hypertension: Secondary | ICD-10-CM

## 2013-07-10 LAB — CBC WITH DIFFERENTIAL/PLATELET
Basophils Relative: 0.4 % (ref 0.0–3.0)
Eosinophils Absolute: 0 10*3/uL (ref 0.0–0.7)
Eosinophils Relative: 0.5 % (ref 0.0–5.0)
HCT: 40 % (ref 36.0–46.0)
Hemoglobin: 13.1 g/dL (ref 12.0–15.0)
MCHC: 32.9 g/dL (ref 30.0–36.0)
MCV: 92.6 fl (ref 78.0–100.0)
Monocytes Absolute: 0.4 10*3/uL (ref 0.1–1.0)
Neutro Abs: 2.8 10*3/uL (ref 1.4–7.7)
Neutrophils Relative %: 47.6 % (ref 43.0–77.0)
Platelets: 330 10*3/uL (ref 150.0–400.0)
RBC: 4.32 Mil/uL (ref 3.87–5.11)
WBC: 5.9 10*3/uL (ref 4.5–10.5)

## 2013-07-10 LAB — COMPREHENSIVE METABOLIC PANEL
AST: 23 U/L (ref 0–37)
Albumin: 4.5 g/dL (ref 3.5–5.2)
Alkaline Phosphatase: 83 U/L (ref 39–117)
BUN: 9 mg/dL (ref 6–23)
Calcium: 9.8 mg/dL (ref 8.4–10.5)
Chloride: 104 mEq/L (ref 96–112)
Creatinine, Ser: 0.6 mg/dL (ref 0.4–1.2)
Glucose, Bld: 99 mg/dL (ref 70–99)
Potassium: 3.5 mEq/L (ref 3.5–5.1)
Sodium: 140 mEq/L (ref 135–145)

## 2013-07-10 LAB — TSH: TSH: 0.48 u[IU]/mL (ref 0.35–5.50)

## 2013-07-10 MED ORDER — AMLODIPINE BESYLATE 5 MG PO TABS: 5.0000 mg | ORAL_TABLET | Freq: Every day | ORAL | Status: DC

## 2013-07-10 MED ORDER — LOSARTAN POTASSIUM 50 MG PO TABS: 50.0000 mg | ORAL_TABLET | Freq: Every day | ORAL | Status: DC

## 2013-07-10 NOTE — Progress Notes (Signed)
   Subjective:    Patient ID: Paige Foster, female    DOB: 1965/11/18, 47 y.o.   MRN: [id number]  HPI Here to discuss high blood pressure. Was recently seen for a URI and was noted to have elevated blood pressure. On further questioning, she was at a health fair at work in October and her BP was 164. Today the BP is elevated and this is the third time we have a documented elevated blood pressure. She has a very healthy diet, does not eat excessive salt, is not taking any decongestant. As far as the URI she is doing better.  Past Medical History  Diagnosis Date  . Breast CA     (07/26/2002)--s/p surgery , had Tamoxifen up until 2007 per pt (no XRT-Chemo)   Past Surgical History  Procedure Laterality Date  . Mastectomy Left     Mastectomy (2004) (left) and  a flap procedure   . Abdominoplasty      at time of mastectomy per pt  . Breast reduction surgery Right   . Tubal ligation        Social History: Married, 2 children Tobacco--no ETOH-- no Occupation-- works at a [employer]  Family History: CAD - M (CABG age ~ late 63s) HTN - M (dx age in her 56s) F , sister  DM - M stroke - no colon Ca - no Breast Ca - no ovarian/uterine Ca - no M deceased blood clot   Review of Systems Diet-- healthy for the most part, no excessive salt Exercise-- very little No  CP, SOB, lower extremity edema Denies  nausea, vomiting diarrhea (-) HAs  + stress, lost father, son is starting to drive, job, etc Sleeps ok     Objective:   Physical Exam BP 186/115  Pulse 97  Temp(Src) 98.2 F (36.8 C)  Wt 163 lb (73.936 kg)  SpO2 99% General -- alert, well-developed, NAD.  Neck --no thyromegaly  Lungs -- normal respiratory effort, no intercostal retractions, no accessory muscle use, and normal breath sounds.  Heart-- normal rate, regular rhythm, no murmur.  Abdomen-- Not distended, good bowel sounds,soft, non-tender. Palpable not tender Ao at the epigastrium, ?bruit  extremities-- no pretibial edema bilaterally  Neurologic--  alert & oriented X3. Speech normal, gait normal, strength normal in all extremities.  Psych-- Cognition and judgment appear intact. Cooperative with normal attention span and concentration. No anxious appearing , no depressed appearing.      Assessment & Plan:

## 2013-07-10 NOTE — Assessment & Plan Note (Addendum)
47 year old lady with family history of hypertension with documented elevated BPs the last 2 months, exam is benign except for a nontender palpable aorta and a question of abdominal bruit This is likely primary hypertension however will do abdominal ultrasound to rule out  RAS and AAA Plan: Labs EKG nsr  Amlodipine, losartan. See instructions

## 2013-07-10 NOTE — Progress Notes (Signed)
Pre visit review using our clinic review tool, if applicable. No additional management support is needed unless otherwise documented below in the visit note. 

## 2013-07-10 NOTE — Patient Instructions (Signed)
Get your blood work before you leave  Next visit for a physical exam   fasting, in 3 weeks Please make an appointment    Check the  blood pressure 2 or 3 times a   week be sure it is between 110/60 and 140/85. Ideal blood pressure is 120/80. If it is consistently higher or lower, let me know   To learn more about hypertension, please visit the American Heart Association, it  is a great resource online at:  M[employer]on101.pl

## 2013-07-11 NOTE — Addendum Note (Signed)
Addended by: Eustace Quail on: 07/11/2013 10:53 AM   Modules accepted: Orders

## 2013-07-12 ENCOUNTER — Encounter: Payer: Self-pay | Admitting: *Deleted

## 2013-07-18 ENCOUNTER — Encounter (HOSPITAL_COMMUNITY): Payer: Managed Care, Other (non HMO)

## 2013-08-02 ENCOUNTER — Encounter (HOSPITAL_COMMUNITY): Payer: Managed Care, Other (non HMO)

## 2013-08-09 ENCOUNTER — Ambulatory Visit (HOSPITAL_COMMUNITY): Payer: BC Managed Care – PPO | Attending: Internal Medicine

## 2013-08-09 DIAGNOSIS — R0989 Other specified symptoms and signs involving the circulatory and respiratory systems: Secondary | ICD-10-CM

## 2013-08-09 DIAGNOSIS — I10 Essential (primary) hypertension: Secondary | ICD-10-CM

## 2013-08-09 DIAGNOSIS — E785 Hyperlipidemia, unspecified: Secondary | ICD-10-CM | POA: Insufficient documentation

## 2013-10-10 ENCOUNTER — Other Ambulatory Visit: Payer: Self-pay | Admitting: Internal Medicine

## 2013-10-31 ENCOUNTER — Encounter: Payer: Self-pay | Admitting: Internal Medicine

## 2013-11-14 ENCOUNTER — Telehealth: Payer: Self-pay | Admitting: Internal Medicine

## 2013-11-14 ENCOUNTER — Encounter: Payer: BC Managed Care – PPO | Admitting: Internal Medicine

## 2013-11-14 NOTE — Telephone Encounter (Signed)
Paige Foster from Dr Ronita Hipps called and requested we send recent lab results/pap smear to there office.  Labs/pap smear were faxed after receiving a medical records request via fax.

## 2013-12-16 ENCOUNTER — Other Ambulatory Visit: Payer: Self-pay | Admitting: Internal Medicine

## 2014-01-12 ENCOUNTER — Other Ambulatory Visit: Payer: Self-pay | Admitting: Internal Medicine

## 2014-03-29 ENCOUNTER — Other Ambulatory Visit: Payer: Self-pay | Admitting: Internal Medicine

## 2014-04-05 ENCOUNTER — Other Ambulatory Visit: Payer: Self-pay | Admitting: Internal Medicine

## 2014-07-15 ENCOUNTER — Other Ambulatory Visit: Payer: Self-pay | Admitting: Internal Medicine

## 2014-07-19 ENCOUNTER — Encounter (HOSPITAL_COMMUNITY): Payer: Self-pay | Admitting: Emergency Medicine

## 2014-07-19 ENCOUNTER — Emergency Department (HOSPITAL_COMMUNITY)
Admission: EM | Admit: 2014-07-19 | Discharge: 2014-07-19 | Disposition: A | Discharge status: Home / Self Care | Payer: BC Managed Care – PPO | Attending: Emergency Medicine | Admitting: Emergency Medicine

## 2014-07-19 DIAGNOSIS — Z79899 Other long term (current) drug therapy: Secondary | ICD-10-CM | POA: Insufficient documentation

## 2014-07-19 DIAGNOSIS — X58XXXA Exposure to other specified factors, initial encounter: Secondary | ICD-10-CM | POA: Diagnosis not present

## 2014-07-19 DIAGNOSIS — Y9289 Other specified places as the place of occurrence of the external cause: Secondary | ICD-10-CM | POA: Insufficient documentation

## 2014-07-19 DIAGNOSIS — I10 Essential (primary) hypertension: Secondary | ICD-10-CM | POA: Insufficient documentation

## 2014-07-19 DIAGNOSIS — Y998 Other external cause status: Secondary | ICD-10-CM | POA: Diagnosis not present

## 2014-07-19 DIAGNOSIS — Y9389 Activity, other specified: Secondary | ICD-10-CM | POA: Diagnosis not present

## 2014-07-19 DIAGNOSIS — Z853 Personal history of malignant neoplasm of breast: Secondary | ICD-10-CM | POA: Diagnosis not present

## 2014-07-19 DIAGNOSIS — H5711 Ocular pain, right eye: Secondary | ICD-10-CM | POA: Diagnosis present

## 2014-07-19 DIAGNOSIS — S0501XA Injury of conjunctiva and corneal abrasion without foreign body, right eye, initial encounter: Secondary | ICD-10-CM | POA: Diagnosis not present

## 2014-07-19 HISTORY — DX: Essential (primary) hypertension: I10

## 2014-07-19 MED ORDER — ERYTHROMYCIN 5 MG/GM OP OINT: TOPICAL_OINTMENT | OPHTHALMIC | Status: DC

## 2014-07-19 MED ORDER — FLUORESCEIN SODIUM 1 MG OP STRP
1.0000 | ORAL_STRIP | Freq: Once | OPHTHALMIC | Status: AC
  Administered 2014-07-19: 1 via OPHTHALMIC
  Filled 2014-07-19: qty 1

## 2014-07-19 MED ORDER — TETRACAINE HCL 0.5 % OP SOLN
1.0000 [drp] | Freq: Once | OPHTHALMIC | Status: AC
  Administered 2014-07-19: 1 [drp] via OPHTHALMIC
  Filled 2014-07-19: qty 2

## 2014-07-19 NOTE — ED Notes (Signed)
Pt comfortable with discharge and follow up instructions. Pt declines wheelchair, escorted to waiting area by this RN. Prescriptions x1. 

## 2014-07-19 NOTE — Discharge Instructions (Signed)
Corneal Abrasion °The cornea is the clear covering at the front and center of the eye. When looking at the colored portion of the eye (iris), you are looking through the cornea. This very thin tissue is made up of many layers. The surface layer is a single layer of cells (corneal epithelium) and is one of the most sensitive tissues in the body. If a scratch or injury causes the corneal epithelium to come off, it is called a corneal abrasion. If the injury extends to the tissues below the epithelium, the condition is called a corneal ulcer. °CAUSES  °· Scratches. °· Trauma. °· Foreign body in the eye. °Some people have recurrences of abrasions in the area of the original injury even after it has healed (recurrent erosion syndrome). Recurrent erosion syndrome generally improves and goes away with time. °SYMPTOMS  °· Eye pain. °· Difficulty or inability to keep the injured eye open. °· The eye becomes very sensitive to light. °· Recurrent erosions tend to happen suddenly, first thing in the morning, usually after waking up and opening the eye. °DIAGNOSIS  °Your health care provider can diagnose a corneal abrasion during an eye exam. Dye is usually placed in the eye using a drop or a small paper strip moistened by your tears. When the eye is examined with a special light, the abrasion shows up clearly because of the dye. °TREATMENT  °· Small abrasions may be treated with antibiotic drops or ointment alone. °· A pressure patch may be put over the eye. If this is done, follow your doctor's instructions for when to remove the patch. Do not drive or use machines while the eye patch is on. Judging distances is hard to do with a patch on. °If the abrasion becomes infected and spreads to the deeper tissues of the cornea, a corneal ulcer can result. This is serious because it can cause corneal scarring. Corneal scars interfere with light passing through the cornea and cause a loss of vision in the involved eye. °HOME CARE  INSTRUCTIONS °· Use medicine or ointment as directed. Only take over-the-counter or prescription medicines for pain, discomfort, or fever as directed by your health care provider. °· Do not drive or operate machinery if your eye is patched. Your ability to judge distances is impaired. °· If your health care provider has given you a follow-up appointment, it is very important to keep that appointment. Not keeping the appointment could result in a severe eye infection or permanent loss of vision. If there is any problem keeping the appointment, let your health care provider know. °SEEK MEDICAL CARE IF:  °· You have pain, light sensitivity, and a scratchy feeling in one eye or both eyes. °· Your pressure patch keeps loosening up, and you can blink your eye under the patch after treatment. °· Any kind of discharge develops from the eye after treatment or if the lids stick together in the morning. °· You have the same symptoms in the morning as you did with the original abrasion days, weeks, or months after the abrasion healed. °MAKE SURE YOU:  °· Understand these instructions. °· Will watch your condition. °· Will get help right away if you are not doing well or get worse. °Document Released: 07/09/2000 Document Revised: 07/17/2013 Document Reviewed: 03/19/2013 °Exit[employer]® Patient Information ©2015 [employer]. This information is not intended to replace advice given to you by your health care provider. Make sure you discuss any questions you have with your health care provider. ° °

## 2014-07-19 NOTE — ED Notes (Signed)
Pt reports last night that she may have scratched her right eye. Pt reports that she already had a scratch in her right eye. Pt presents with redness to right eye. Denies change in vision.

## 2014-07-19 NOTE — ED Notes (Signed)
PA at the bedside.

## 2014-07-19 NOTE — ED Notes (Signed)
Equipment at the bedside for PA.

## 2014-07-19 NOTE — ED Provider Notes (Signed)
CSN: [id number]     Arrival date & time 07/19/14  1259 History   First MD Initiated Contact with Patient 07/19/14 1305    This chart was scribed for non-physician practitioner, Margarita Mail, working with No att. providers found by Terressa Koyanagi, ED Scribe. This patient was seen in room TR04C/TR04C and the patient's care was started at 1:43 PM.  Chief Complaint  Patient presents with  . Eye Problem   HPI PCP: Kathlene November, MD HPI Comments: Paige Foster is a 48 y.o. female, with Hx of HTN, breast cancer (07/26/02) and mastectomy (L breast 2004), who presents to the Emergency Department complaining of traumatic, acute redness with associated mild photophobia, watering of eye and eye pain to her right eye onset last night. Pt believes she may have scratched her right eye last night when she accidentally stuck her nail in her eye. Pt reports one prior scratch to her right eye from several years ago, but denies any other recent trauma to her eyes. Pt denies any visual disturbances. Pt reports she has a regular ophthalmologist.  Past Medical History  Diagnosis Date  . Breast CA     (07/26/2002)--s/p surgery , had Tamoxifen up until 2007 per pt (no XRT-Chemo)  . Hypertension    Past Surgical History  Procedure Laterality Date  . Mastectomy Left     Mastectomy (2004) (left) and  a flap procedure   . Abdominoplasty      at time of mastectomy per pt  . Breast reduction surgery Right   . Tubal ligation     No family history on file. History  Substance Use Topics  . Smoking status: Never Smoker   . Smokeless tobacco: Never Used  . Alcohol Use: No   OB History    No data available     Review of Systems  Constitutional: Negative for fever and chills.  Eyes: Positive for photophobia, pain, discharge (watering of eye ) and redness. Negative for visual disturbance.  Psychiatric/Behavioral: Negative for confusion.   Allergies  Review of patient's allergies indicates no known  allergies.  Home Medications   Prior to Admission medications   Medication Sig Start Date End Date Taking? Authorizing Provider  amLODipine (NORVASC) 5 MG tablet Take 1 tablet daily. DUE FOR APPT  WITH DR PAZ. NO FURTHER REFILLS. [phone]. 07/15/14  Yes Colon Branch, MD  losartan (COZAAR) 50 MG tablet Take 1 tablet daily. DUE FOR APPT WITH DR PAZ. NO FURTHER REFILLS. [phone]. 07/15/14  Yes Colon Branch, MD  amoxicillin-clavulanate (AUGMENTIN) 875-125 MG per tablet Take 1 tablet by mouth 2 (two) times daily. Patient not taking: Reported on 07/19/2014 06/07/13   Shiann Pap, NP  benzonatate (TESSALON) 100 MG capsule Take 1-2 capsules po up to 3 times daily PRN cough Patient not taking: Reported on 07/19/2014 06/07/13   Lorieann Pap, NP  erythromycin ophthalmic ointment Place a 1/2 inch ribbon of ointment into the lower eyelid every 4 hours for 5 days 07/19/14   Margarita Mail, PA-C   Triage Vitals: BP 160/90 mmHg  Pulse 77  Temp(Src) 98.1 F (36.7 C) (Oral)  Resp 18  Ht 5\' 4"  (1.626 m)  Wt 160 lb (72.576 kg)  BMI 27.45 kg/m2  SpO2 100% Physical Exam  Constitutional: She is oriented to person, place, and time. She appears well-developed and well-nourished. No distress.  HENT:  Head: Normocephalic and atraumatic.  Eyes: EOM are normal. Right conjunctiva is injected.  Slit lamp exam:  The right eye shows corneal abrasion and fluorescein uptake.  Eye Pressure: Left Eye: 17; Right Eye:15   Neck: Neck supple.  Pulmonary/Chest: Effort normal. No respiratory distress.  Musculoskeletal: Normal range of motion.  Neurological: She is alert and oriented to person, place, and time.  Skin: Skin is warm and dry.  Psychiatric: She has a normal mood and affect. Her behavior is normal.  Nursing note and vitals reviewed.   ED Course  Procedures (including critical care time) DIAGNOSTIC STUDIES: Oxygen Saturation is 100% on RA, nl by my interpretation.    COORDINATION OF CARE: 1:52  PM-Discussed treatment plan which includes meds, f/u with ophthalmologist, with pt at bedside and pt agreed to plan.   Labs Review Labs Reviewed - No data to display  Imaging Review No results found.   EKG Interpretation None      MDM   Final diagnoses:  Corneal abrasion, right, initial encounter    Corneal abrasion  Pt with corneal abrasion on PE. No evidence of FB.  No change in vision, acuity equal bilaterally.  Pt is not a contact lens wearer.  Exam non-concerning for orbital cellulitis, hyphema, corneal ulcers. Patient will be discharged home with erythromycin.   Patient understands to follow up with ophthalmology, & to return to ER if new symptoms develop including change in vision, purulent drainage, or entrapment.    I personally performed the services described in this documentation, which was scribed in my presence. The recorded information has been reviewed and is accurate.     Margarita Mail, PA-C 08/01/14 8325  Pamella Pert, MD 08/01/14 [phone]

## 2014-09-07 ENCOUNTER — Other Ambulatory Visit: Payer: Self-pay | Admitting: Internal Medicine

## 2014-11-11 ENCOUNTER — Encounter: Payer: Self-pay | Admitting: Internal Medicine

## 2014-11-18 ENCOUNTER — Ambulatory Visit: Payer: BLUE CROSS/BLUE SHIELD | Admitting: Family

## 2014-11-18 DIAGNOSIS — Z0289 Encounter for other administrative examinations: Secondary | ICD-10-CM

## 2014-11-22 ENCOUNTER — Telehealth: Payer: Self-pay | Admitting: Internal Medicine

## 2014-11-22 ENCOUNTER — Other Ambulatory Visit: Payer: Self-pay

## 2014-11-22 NOTE — Telephone Encounter (Signed)
Received VM 11/18/14 4:52pm on cancel/reschedule line. Pt states she has to cancel same day appt for 5:15pm. No reason given.

## 2014-11-25 ENCOUNTER — Encounter: Payer: Self-pay | Admitting: Internal Medicine

## 2014-11-25 ENCOUNTER — Ambulatory Visit (INDEPENDENT_AMBULATORY_CARE_PROVIDER_SITE_OTHER): Payer: BLUE CROSS/BLUE SHIELD | Admitting: Internal Medicine

## 2014-11-25 VITALS — BP 128/64 | HR 82 | Temp 98.0°F | Ht 64.0 in | Wt 153.5 lb

## 2014-11-25 DIAGNOSIS — I10 Essential (primary) hypertension: Secondary | ICD-10-CM | POA: Diagnosis not present

## 2014-11-25 DIAGNOSIS — R1013 Epigastric pain: Secondary | ICD-10-CM | POA: Insufficient documentation

## 2014-11-25 MED ORDER — PANTOPRAZOLE SODIUM 40 MG PO TBEC: 40.0000 mg | DELAYED_RELEASE_TABLET | Freq: Every day | ORAL | Status: DC

## 2014-11-25 MED ORDER — LOSARTAN POTASSIUM 50 MG PO TABS: 50.0000 mg | ORAL_TABLET | Freq: Every day | ORAL | Status: DC

## 2014-11-25 MED ORDER — AMLODIPINE BESYLATE 5 MG PO TABS: 5.0000 mg | ORAL_TABLET | Freq: Every day | ORAL | Status: DC

## 2014-11-25 NOTE — Patient Instructions (Addendum)
Get your blood work before you leave   Take Protonix 1 tablet before breakfast every day Call back on your blood pressure medications   Come back to the office in 1 months   for a routine check up

## 2014-11-25 NOTE — Assessment & Plan Note (Signed)
Run out of medication a couple of days ago, BP today is satisfactory, refill medicines.

## 2014-11-25 NOTE — Assessment & Plan Note (Addendum)
2 weeks history of change in her bowel habits, feeling constipated, having abdominal bloating, symptoms somewhat worse after she eats. Denies blood in the stools, abdominal exam is benign, no weight loss. Plan:  Labs trial with Protonix (bloated, PUD?) She already call GI has an appointment to see a nurse and subsequently a colonoscopy.  Depending on the labs she may need EGD or an abdominal ultrasound; will let GI  know that she was seen today.

## 2014-11-25 NOTE — Progress Notes (Signed)
Pre visit review using our clinic review tool, if applicable. No additional management support is needed unless otherwise documented below in the visit note. 

## 2014-11-25 NOTE — Progress Notes (Signed)
Subjective:    Patient ID: Paige Foster, female    DOB: 1965/10/23, 49 y.o.   MRN: [id number]  DOS:  11/25/2014 Type of visit - description : acute Interval history: Symptoms started about 2 weeks ago with constipation and diarrhea. States that her normal is to have a bowel movement twice a day but here lately BMs have been less frequent (how infrequent? Can't tell) and she is feeling bloated. Then she takes a laxative and that is follow-up by diarrhea. Patient has a hard time describing the situation any better. She feels more bloated after she eats. She already called GI and has an appointment to see the nurse and subsequently a colonoscopy.  High blood pressure, run out of medications a couple of days ago, BP today satisfactory   Review of Systems  Denies fever chills, no unexpected weight loss No classic heartburn, dysphagia or odynophagia Not taking Motrin or similar medications No dysuria, gross hematuria difficulty urinating      Family History: CAD - M (CABG age ~ late 75s) HTN - M DM - M stroke - no colon Ca - no Breast Ca - no ovarian/uterine Ca - no M deceased blood clot  Past Medical History  Diagnosis Date  . Breast CA     (07/26/2002)--s/p surgery , had Tamoxifen up until 2007 per pt (no XRT-Chemo)  . Hypertension     Past Surgical History  Procedure Laterality Date  . Mastectomy Left     Mastectomy (2004) (left) and  a flap procedure   . Abdominoplasty      at time of mastectomy per pt  . Breast reduction surgery Right   . Tubal ligation      History   Social History  . Marital Status: Married    Spouse Name: N/A  . Number of Children: 2  . Years of Education: N/A   Occupational History  . works at a [employer], applications    Social History Main Topics  . Smoking status: Never Smoker   . Smokeless tobacco: Never Used  . Alcohol Use: No  . Drug Use: No  . Sexual Activity: Not on file   Other Topics Concern  . Not on file   Social History  Narrative        Medication List       This list is accurate as of: 11/25/14 11:59 PM.  Always use your most recent med list.               amLODipine 5 MG tablet  Commonly known as:  NORVASC  Take 1 tablet (5 mg total) by mouth daily.     losartan 50 MG tablet  Commonly known as:  COZAAR  Take 1 tablet (50 mg total) by mouth daily.     pantoprazole 40 MG tablet  Commonly known as:  PROTONIX  Take 1 tablet (40 mg total) by mouth daily.           Objective:   Physical Exam BP 128/64 mmHg  Pulse 82  Temp(Src) 98 F (36.7 C) (Oral)  Ht 5\' 4"  (1.626 m)  Wt 153 lb 8 oz (69.627 kg)  BMI 26.34 kg/m2  SpO2 97% General:   Well developed, well nourished . NAD.  HEENT:  Normocephalic . Face symmetric, atraumatic Lungs:  CTA B Normal respiratory effort, no intercostal retractions, no accessory muscle use. Heart: RRR,  no murmur.  Abdomen:  Not distended, soft, non-tender. No rebound or rigidity. No mass,organomegaly Muscle skeletal:  no pretibial edema bilaterally  Skin: Not pale. Not jaundice Neurologic:  alert & oriented X3.  Speech normal, gait appropriate for age and unassisted Psych--  Cognition and judgment appear intact.  Cooperative with normal attention span and concentration.  Behavior appropriate. No anxious or depressed appearing.       Assessment & Plan:

## 2014-11-26 LAB — CBC WITH DIFFERENTIAL/PLATELET
Basophils Absolute: 0 10*3/uL (ref 0.0–0.1)
Basophils Relative: 0.6 % (ref 0.0–3.0)
Eosinophils Absolute: 0 10*3/uL (ref 0.0–0.7)
Eosinophils Relative: 0.3 % (ref 0.0–5.0)
HEMATOCRIT: 37.1 % (ref 36.0–46.0)
Hemoglobin: 12.7 g/dL (ref 12.0–15.0)
LYMPHS ABS: 1.9 10*3/uL (ref 0.7–4.0)
Lymphocytes Relative: 31.9 % (ref 12.0–46.0)
MCHC: 34.3 g/dL (ref 30.0–36.0)
MCV: 90.4 fl (ref 78.0–100.0)
MONOS PCT: 6.3 % (ref 3.0–12.0)
Monocytes Absolute: 0.4 10*3/uL (ref 0.1–1.0)
Neutro Abs: 3.7 10*3/uL (ref 1.4–7.7)
Neutrophils Relative %: 60.9 % (ref 43.0–77.0)
Platelets: 269 10*3/uL (ref 150.0–400.0)
RBC: 4.1 Mil/uL (ref 3.87–5.11)
RDW: 12.5 % (ref 11.5–15.5)
WBC: 6.1 10*3/uL (ref 4.0–10.5)

## 2014-11-26 LAB — COMPREHENSIVE METABOLIC PANEL
ALK PHOS: 78 U/L (ref 39–117)
ALT: 13 U/L (ref 0–35)
AST: 17 U/L (ref 0–37)
Albumin: 4 g/dL (ref 3.5–5.2)
BUN: 10 mg/dL (ref 6–23)
CALCIUM: 9.7 mg/dL (ref 8.4–10.5)
CHLORIDE: 104 meq/L (ref 96–112)
CO2: 27 mEq/L (ref 19–32)
Creatinine, Ser: 0.67 mg/dL (ref 0.40–1.20)
GFR: 119.69 mL/min (ref >60.00)
Glucose, Bld: 91 mg/dL (ref 70–99)
POTASSIUM: 3.2 meq/L — AB (ref 3.5–5.1)
SODIUM: 138 meq/L (ref 135–145)
TOTAL PROTEIN: 7.5 g/dL (ref 6.0–8.3)
Total Bilirubin: 0.3 mg/dL (ref 0.2–1.2)

## 2014-11-26 LAB — LIPASE: LIPASE: 9 U/L — AB (ref 11.0–59.0)

## 2014-11-26 LAB — AMYLASE: Amylase: 42 U/L (ref 27–131)

## 2014-11-27 ENCOUNTER — Telehealth: Payer: Self-pay | Admitting: *Deleted

## 2014-11-27 NOTE — Telephone Encounter (Signed)
Spoke with Jenny Reichmann at Dr. Lorie Apley office and patient will have to sign a release here or at their office for her records.

## 2014-11-27 NOTE — Telephone Encounter (Signed)
Paige Foster, this pt was scheduled for direct colon but Dr Danise Mina suggests that we see her in the office first. She has a history with Dr Collene Mares. Thanx DB

## 2014-11-27 NOTE — Telephone Encounter (Signed)
Left a message for patient to call back. 

## 2014-11-27 NOTE — Telephone Encounter (Signed)
Dr Larose Kells patient.       Previous Messages     ----- Message -----   From: Lafayette Dragon, MD   Sent: 11/26/2014  5:22 PM    To: Hulan Saas, RN, Ria Bush, MD  Subject: Lacie Draft, this pt was scheduled for direct colon but Dr Danise Mina suggests that we see her in the office first. She has a history with Dr Collene Mares. Thanx DB

## 2014-11-28 NOTE — Telephone Encounter (Signed)
Left a message for patient to call about appointments.

## 2014-11-29 NOTE — Telephone Encounter (Signed)
Left a message for patient to call about appointments.

## 2014-12-02 NOTE — Telephone Encounter (Signed)
Spoke with patient and scheduled on 12/10/14 at 2:30 PM. Patient will get records from Dr. Lorie Apley office.

## 2014-12-10 ENCOUNTER — Encounter: Payer: Self-pay | Admitting: Internal Medicine

## 2014-12-10 ENCOUNTER — Ambulatory Visit (INDEPENDENT_AMBULATORY_CARE_PROVIDER_SITE_OTHER): Payer: BLUE CROSS/BLUE SHIELD | Admitting: Internal Medicine

## 2014-12-10 VITALS — BP 150/80 | HR 76 | Ht 62.75 in | Wt 160.2 lb

## 2014-12-10 DIAGNOSIS — Z1211 Encounter for screening for malignant neoplasm of colon: Secondary | ICD-10-CM

## 2014-12-10 MED ORDER — NA SULFATE-K SULFATE-MG SULF 17.5-3.13-1.6 GM/177ML PO SOLN: 1.0000 | Freq: Once | ORAL | Status: DC

## 2014-12-10 NOTE — Progress Notes (Signed)
HARLIE BUENING 12-Jul-1966 [id number]  Note: This dictation was prepared with Dragon digital system. Any transcriptional errors that result from this procedure are unintentional.   History of Present Illness: This is a 49 year old African-American female here to discuss screening colonoscopy. She has a history of breast cancer. She underwent  prior colonoscopy in July 2005 forrectal bleeding. She had a hyperplastic polyp removed. Her prep was suboptimal. Several  months ago she developed constipation after she discontinued fiber supplements. The bowel habits normalize  after she reintroduced fiber, she is currently taking fiber pills 3 a day and eating high fiber diet. There is no family history of colon cancer. Iron pills every day because of history of iron deficiency.    Past Medical History  Diagnosis Date  . Breast CA     (07/26/2002)--s/p surgery , had Tamoxifen up until 2007 per pt (no XRT-Chemo)  . Hypertension     Past Surgical History  Procedure Laterality Date  . Mastectomy Left     Mastectomy (2004) (left) and  a flap procedure   . Abdominoplasty      at time of mastectomy per pt  . Breast reduction surgery Right   . Tubal ligation      No Known Allergies  Family history and social history have been reviewed.  Review of Systems:   The remainder of the 10 point ROS is negative except as outlined in the H&P  Physical Exam: General Appearance Well developed, in no distress Eyes  Non icteric  HEENT  Non traumatic, normocephalic  Mouth No lesion, tongue papillated, no cheilosis Neck Supple without adenopathy, thyroid not enlarged, no carotid bruits, no JVD Lungs Clear to auscultation bilaterally COR Normal S1, normal S2, regular rhythm, no murmur, quiet precordium Abdomen status post abdominoplasty. Normoactive bowel sounds. No tenderness no palpable mass. Rectal soft Hemoccult negative stool Extremities  No pedal edema Skin No lesions Neurological Alert and  oriented x 3 Psychological Normal mood and affect  Assessment and Plan:   49 year old African-American female with change in bowel habits likely due to lack of fiber supplements. Her bowel habits have normalized since she has a reintroduced  fiber into her diet. She has a history of iron deficiency and remains on iron supplements on daily basis. She is an appropriate  candidate for screening colonoscopy. Last exam was more than 10 years ago. She will discontinue iron prior to the procedure. We have discussed sedation, prep and the procedure itself.    Delfin Edis 12/10/2014

## 2014-12-10 NOTE — Patient Instructions (Addendum)
Dr Malachy Moan have been scheduled for a colonoscopy. Please follow written instructions given to you at your visit today.  Please pick up your prep supplies at the pharmacy within the next 1-3 days. If you use inhalers (even only as needed), please bring them with you on the day of your procedure.

## 2014-12-11 ENCOUNTER — Telehealth: Payer: Self-pay | Admitting: Internal Medicine

## 2014-12-11 ENCOUNTER — Encounter: Payer: Self-pay | Admitting: Internal Medicine

## 2014-12-11 NOTE — Telephone Encounter (Signed)
Received records from [facility] forwarded to Dr. Olevia Perches

## 2014-12-18 ENCOUNTER — Ambulatory Visit (AMBULATORY_SURGERY_CENTER): Payer: BLUE CROSS/BLUE SHIELD | Admitting: Internal Medicine

## 2014-12-18 ENCOUNTER — Encounter: Payer: Self-pay | Admitting: Internal Medicine

## 2014-12-18 VITALS — BP 123/84 | HR 77 | Temp 98.6°F | Resp 16 | Ht 62.0 in | Wt 160.0 lb

## 2014-12-18 DIAGNOSIS — Z1211 Encounter for screening for malignant neoplasm of colon: Secondary | ICD-10-CM

## 2014-12-18 DIAGNOSIS — D124 Benign neoplasm of descending colon: Secondary | ICD-10-CM | POA: Diagnosis not present

## 2014-12-18 DIAGNOSIS — D122 Benign neoplasm of ascending colon: Secondary | ICD-10-CM

## 2014-12-18 MED ORDER — SODIUM CHLORIDE 0.9 % IV SOLN: 500.0000 mL | INTRAVENOUS | Status: DC

## 2014-12-18 NOTE — Progress Notes (Signed)
To Pacu Pt awake and alert, pleased with MAC. Report to RN

## 2014-12-18 NOTE — Patient Instructions (Signed)
YOU HAD AN ENDOSCOPIC PROCEDURE TODAY AT [facility]:   Refer to the procedure report that was given to you for any specific questions about what was found during the examination.  If the procedure report does not answer your questions, please call your gastroenterologist to clarify.  If you requested that your care partner not be given the details of your procedure findings, then the procedure report has been included in a sealed envelope for you to review at your convenience later.  YOU SHOULD EXPECT: Some feelings of bloating in the abdomen. Passage of more gas than usual.  Walking can help get rid of the air that was put into your GI tract during the procedure and reduce the bloating. If you had a lower endoscopy (such as a colonoscopy or flexible sigmoidoscopy) you may notice spotting of blood in your stool or on the toilet paper. If you underwent a bowel prep for your procedure, you may not have a normal bowel movement for a few days.  Please Note:  You might notice some irritation and congestion in your nose or some drainage.  This is from the oxygen used during your procedure.  There is no need for concern and it should clear up in a day or so.  SYMPTOMS TO REPORT IMMEDIATELY:   Following lower endoscopy (colonoscopy or flexible sigmoidoscopy):  Excessive amounts of blood in the stool  Significant tenderness or worsening of abdominal pains  Swelling of the abdomen that is new, acute  Fever of 100F or higher For urgent or emergent issues, a gastroenterologist can be reached at any hour by calling [phone].  DIET: Your first meal following the procedure should be a small meal and then it is ok to progress to your normal diet. Heavy or fried foods are harder to digest and may make you feel nauseous or bloated.  Likewise, meals heavy in dairy and vegetables can increase bloating.  Drink plenty of fluids but you should avoid alcoholic beverages for 24 hours.  ACTIVITY:   You should plan to take it easy for the rest of today and you should NOT DRIVE or use heavy machinery until tomorrow (because of the sedation medicines used during the test).    FOLLOW UP: Our staff will call the number listed on your records the next business day following your procedure to check on you and address any questions or concerns that you may have regarding the information given to you following your procedure. If we do not reach you, we will leave a message.  However, if you are feeling well and you are not experiencing any problems, there is no need to return our call.  We will assume that you have returned to your regular daily activities without incident.  If any biopsies were taken you will be contacted by phone or by letter within the next 1-3 weeks.  Please call us at [phone] if you have not heard about the biopsies in 3 weeks.    SIGNATURES/CONFIDENTIALITY: You and/or your care partner have signed paperwork which will be entered into your electronic medical record.  These signatures attest to the fact that that the information above on your After Visit Summary has been reviewed and is understood.  Full responsibility of the confidentiality of this discharge information lies with you and/or your care-partner  Await pathology  Continue your normal medications  Please read over handout about polyps

## 2014-12-18 NOTE — Progress Notes (Signed)
Called to room to assist during endoscopic procedure.  Patient ID and intended procedure confirmed with present staff. Received instructions for my participation in the procedure from the performing physician.  

## 2014-12-18 NOTE — Op Note (Signed)
[place]  [address] [place], [id number]   COLONOSCOPY PROCEDURE REPORT  PATIENT: Paige Foster, Paige Foster  MR#: [id number] BIRTHDATE: Jul 15, 1966 , 49  yrs. old GENDER: female ENDOSCOPIST: Lafayette Dragon, MD REFERRED BC:WUGQ Larose Kells, M.D. PROCEDURE DATE:  12/18/2014 PROCEDURE:   Colonoscopy, screening and Colonoscopy with snare polypectomy First Screening Colonoscopy - Avg.  risk and is 50 yrs.  old or older Yes.  Prior Negative Screening - Now for repeat screening. N/A  History of Adenoma - Now for follow-up colonoscopy & has been > or = to 3 yrs.  N/A  Polyps removed today? Yes ASA CLASS:   Class II INDICATIONS:Screening for colonic neoplasia and Colorectal Neoplasm Risk Assessment for this procedure is average risk. MEDICATIONS: Monitored anesthesia care and Propofol 450 mg IV  DESCRIPTION OF PROCEDURE:   After the risks benefits and alternatives of the procedure were thoroughly explained, informed consent was obtained.  The digital rectal exam revealed no abnormalities of the rectum.   The LB [id number]  endoscope was introduced through the anus and advanced to the cecum, which was identified by both the appendix and ileocecal valve. No adverse events experienced.   The quality of the prep was excellent. (MoviPrep was used)  The instrument was then slowly withdrawn as the colon was fully examined. Estimated blood loss is zero unless otherwise noted in this procedure report.      COLON FINDINGS: Two smooth semi-pedunculated polyps measuring 10 mm in size were found in the descending colon and ascending colon.  A polypectomy was performed with a cold snare.  The resection was complete, the polyp tissue was completely retrieved and sent to histology.  A polypectomy was performed using snare cautery.  The resection was complete, the polyp tissue was completely retrieved and sent to histology.   Small internal hemorrhoids were found. Retroflexed views  revealed no abnormalities. The time to cecum = 16.14 Withdrawal time = 6.14   The scope was withdrawn and the procedure completed. COMPLICATIONS: There were no immediate complications.  ENDOSCOPIC IMPRESSION: 1.   Two semi-pedunculated polyps were found in the descending colon and ascending colon; polypectomy was performed with a cold snare Descending colon) ; polypectomy was performed using snare cautery ( in the ascending colon) 2.   Small internal hemorrhoids  RECOMMENDATIONS: 1.  Await pathology results 2.  High fiber diet Recall colonoscopy pending path report  eSigned:  Lafayette Dragon, MD 12/18/2014 11:19 AM   cc:   PATIENT NAME:  Paige Foster, Paige Foster MR#: [id number]

## 2014-12-19 ENCOUNTER — Telehealth: Payer: Self-pay | Admitting: *Deleted

## 2014-12-19 NOTE — Telephone Encounter (Signed)
  Follow up Call-  Call back number 12/18/2014  Post procedure Call Back phone  # [phone]  Permission to leave phone message Yes     No answer at # given.  Left message on VM.

## 2014-12-24 ENCOUNTER — Encounter: Payer: Self-pay | Admitting: Internal Medicine

## 2014-12-31 ENCOUNTER — Ambulatory Visit: Payer: BLUE CROSS/BLUE SHIELD | Admitting: Internal Medicine

## 2015-03-31 ENCOUNTER — Other Ambulatory Visit: Payer: Self-pay | Admitting: Internal Medicine

## 2015-05-03 ENCOUNTER — Other Ambulatory Visit: Payer: Self-pay | Admitting: Internal Medicine

## 2015-12-24 LAB — HM DEXA SCAN

## 2015-12-24 LAB — HM MAMMOGRAPHY

## 2016-01-01 ENCOUNTER — Encounter: Payer: Self-pay | Admitting: Internal Medicine

## 2016-01-09 ENCOUNTER — Telehealth: Payer: Self-pay

## 2016-01-09 NOTE — Telephone Encounter (Signed)
Per Dr. Larose Kells: T-score: -1.6, Dx: osteopenia. Recommend to take OTC calcium and Vitamin D. OV if needed to discuss further. Letter printed and mailed to Pt regarding results.

## 2016-03-02 ENCOUNTER — Ambulatory Visit (INDEPENDENT_AMBULATORY_CARE_PROVIDER_SITE_OTHER): Payer: Managed Care, Other (non HMO) | Admitting: Gastroenterology

## 2016-03-02 ENCOUNTER — Encounter: Payer: Self-pay | Admitting: Gastroenterology

## 2016-03-02 VITALS — BP 130/70 | HR 84 | Ht 64.0 in | Wt 174.0 lb

## 2016-03-02 DIAGNOSIS — R140 Abdominal distension (gaseous): Secondary | ICD-10-CM | POA: Diagnosis not present

## 2016-03-02 DIAGNOSIS — Z86010 Personal history of colonic polyps: Secondary | ICD-10-CM | POA: Diagnosis not present

## 2016-03-02 NOTE — Patient Instructions (Addendum)
Food Guidelines for a sensitive stomach  Many people have difficulty digesting certain foods, causing a variety of distressing and embarrassing symptoms such as abdominal pain, bloating and gas.  These foods may need to be avoided or consumed in small amounts.  Here are some tips that might be helpful for you.  1.   Lactose intolerance is the difficulty or complete inability to digest lactose, the natural sugar in milk and anything made from milk.  This condition is harmless, common, and can begin any time during life.  Some people can digest a modest amount of lactose while others cannot tolerate any.  Also, not all dairy products contain equal amounts of lactose.  For example, hard cheeses such as parmesan have less lactose than soft cheeses such as cheddar.  Yogurt has less lactose than milk or cheese.  Many packaged foods (even many brands of bread) have milk, so read ingredient lists carefully.  It is difficult to test for lactose intolerance, so just try avoiding lactose as much as possible for a week and see what happens with your symptoms.  If you seem to be lactose intolerant, the best plan is to avoid it (but make sure you get calcium from another source).  The next best thing is to use lactase enzyme supplements, available over the counter everywhere.  Just know that many lactose intolerant people need to take several tablets with each serving of dairy to avoid symptoms.  Lastly, a lot of restaurant food is made with milk or butter.  Many are things you might not suspect, such as mashed potatoes, rice and pasta (cooked with butter) and "grilled" items.  If you are lactose intolerant, it never hurts to ask your server what has milk or butter.  2.   Fiber is an important part of your diet, but not all fiber is well-tolerated. Insoluble fiber such as bran is often consumed by normal gut bacteria and converted into gas. Soluble fiber such as oats, squash, carrots and green beans are typically tolerated  better.  3.   Some types of carbohydrates can be poorly digested.  Examples include: fructose (apples, cherries, pears, raisins and other dried fruits), fructans (onions, zucchini, large amounts of wheat), sorbitol/mannitol/xylitol and sucralose/Splenda (common artificial sweeteners), and raffinose (lentils, broccoli, cabbage, asparagus, brussel sprouts, many types of beans).  Do a Development worker, community for [employer] and you will find helpful information. Beano, a dietary supplement, will often help with raffinose-containing foods. As with lactase tablets, you may need several per serving.  4.   Whenever possible, avoid processed food&meats and chemical additives. High fructose corn syrup, a common sweetener, may be difficult to digest.  Eggs and soy (comes from the soybean, and added to many foods now) are the other most common bloating/gassy foods.  If you stop eating milk products , you must get enough calcium and vitamin D.  You can purchase calcium 500 mg tablets that have vitamin D in them. Take 2 every day. They can be purchased over the counter at any pharmacy.  We have also changed your next colonoscopy to 2021 from 2019.  If you are age 21 or older, your body mass index should be between 23-30. Your Body mass index is 29.87 kg/m. If this is out of the aforementioned range listed, please consider follow up with your Primary Care Provider.  If you are age 66 or younger, your body mass index should be between 19-25. Your Body mass index is 29.87 kg/m. If this is  out of the aformentioned range listed, please consider follow up with your Primary Care Provider.   Thank you for choosing [facility]  Dr Wilfrid Lund III

## 2016-03-02 NOTE — Progress Notes (Signed)
[facility] Progress Note  Chief Complaint: Bloating  Subjective  History:  This is a 50 year old woman last seen by Dr. Olevia Perches for screening colonoscopy in May 2016. A subcentimeter tubular adenoma and subsequent meter sessile serrated polyp were removed. Dr. Olevia Perches planned for a 3 year recall. Patient describes abdominal bloating and gas which she attributes to her transverse flap/abdominal wall mesh placement in 2004. She generally has a good bowel movement 1 or 2 times a day, though sometimes it is small pieces. She generally feels completely evacuated after BM. She is always felt that she is intolerant of dairy products, as they cause more bloating and gas. However, she has been reluctant to stop them because she needs calcium to decrease her risk of osteoporosis. Is not clear whether or not she has discussed this with her primary care physician. She denies rectal bleeding early satiety nausea vomiting or weight loss. Again, the symptoms have been going on for since 2004. ROS: Cardiovascular:  no chest pain Respiratory: no dyspnea  The patient's Past Medical, Family and Social History were reviewed and are on file in the EMR.  Objective:  Med list reviewed  Vital signs in last 24 hrs: Vitals:   03/02/16 0833  BP: 130/70  Pulse: 84    Physical Exam   HEENT: sclera anicteric, oral mucosa moist without lesions  Neck: supple, no thyromegaly, JVD or lymphadenopathy  Cardiac: RRR without murmurs, S1S2 heard, no peripheral edema  Pulm: clear to auscultation bilaterally, normal RR and effort noted  Abdomen: soft, No tenderness, with active bowel sounds. No guarding or palpable hepatosplenomegaly. trans-flap scar  Skin; warm and dry, no jaundice or rash    @ASSESSMENTPLANBEGIN @ Assessment: Encounter Diagnoses  Name Primary?  . Abdominal bloating Yes  . PERSONAL HX COLONIC POLYPS    The symptoms sound largely dietary. I gave her some written advice, and also advice on  calcium plus vitamin D supplements. She should follow up with primary care about that. I changed her colonoscopy recall to 5 years since she has no family history of cancer, a reported good bowel preparation on last exam, and 2 small subsegmental meter polyps  Total time 20 minutes, over half spent in counseling and coordination of care.  Nelida Meuse III

## 2016-06-04 ENCOUNTER — Ambulatory Visit (INDEPENDENT_AMBULATORY_CARE_PROVIDER_SITE_OTHER): Payer: Managed Care, Other (non HMO) | Admitting: Physician Assistant

## 2016-06-04 VITALS — BP 120/80 | HR 78 | Temp 98.1°F | Resp 18 | Ht 64.0 in | Wt 173.0 lb

## 2016-06-04 DIAGNOSIS — M25532 Pain in left wrist: Secondary | ICD-10-CM

## 2016-06-04 MED ORDER — MELOXICAM 15 MG PO TABS: 7.5000 mg | ORAL_TABLET | Freq: Every day | ORAL | 0 refills | Status: DC

## 2016-06-04 NOTE — Progress Notes (Signed)
06/07/2016 1:40 PM   DOB: 06-30-1966 / MRN: [id number]  SUBJECTIVE:  Paige Foster is a 50 y.o. female presenting for wrist pain that started about 1 month ago.  Report this is carpal tunnel.  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  Complain of pain that starts in the left thumb and middle finger and goes all the way to the elbow.  This is a new problem for her.  She associates weakness.  She has a history of wrist fracture but this was 20 years ago and made better with physical therapy and time.     She has No Known Allergies.   She  has a past medical history of Breast CA ([place]); Colon polyps; and Hypertension.    She  reports that she has never smoked. She has never used smokeless tobacco. She reports that she drinks alcohol. She reports that she does not use drugs. She  has no sexual activity history on file. The patient  has a past surgical history that includes Mastectomy (Left); Abdominoplasty; Breast reduction surgery (Right); Tubal ligation; Colonoscopy; and Breast surgery.  Her family history includes Diabetes in her mother; Kidney disease in her father.  Review of Systems  Constitutional: Negative for fever.  Respiratory: Negative for cough.   Musculoskeletal: Positive for joint pain. Negative for myalgias.  Neurological: Negative for dizziness.    The problem list and medications were reviewed and updated by myself where necessary and exist elsewhere in the encounter.   OBJECTIVE:  BP 120/80 (BP Location: Right Arm, Patient Position: Sitting, Cuff Size: Small)   Pulse 78   Temp 98.1 F (36.7 C) (Oral)   Resp 18   Ht 5\' 4"  (1.626 m)   Wt 173 lb (78.5 kg)   SpO2 97%   BMI 29.70 kg/m   Physical Exam  Constitutional: She is oriented to person, place, and time. She appears well-nourished. No distress.  Eyes: EOM are normal. Pupils are equal, round, and reactive to light.  Cardiovascular: Normal rate.   Pulmonary/Chest: Effort normal.  Abdominal:  She exhibits no distension.  Musculoskeletal:       Arms: Neurological: She is alert and oriented to person, place, and time. No cranial nerve deficit. Gait normal.  Skin: Skin is dry. She is not diaphoretic.  Psychiatric: She has a normal mood and affect.  Vitals reviewed.   No results found for this or any previous visit (from the past 72 hour(s)).  No results found.  ASSESSMENT AND PLAN  [name] was seen today for wrist pain.  Diagnoses and all orders for this visit:  Acute pain of left wrist: Likely overuse without a specific pathology at this point. Wrist splint and Meloxicam for now.  RTC in 2-3 weeks if not improving and will consider ortho or prep pending eval. Of note she did continue to ask me "do you think I need to be out of work," or "do you think my work caused this problem?"  I have advised that she continue to work however should wear her brace.   -     meloxicam (MOBIC) 15 MG tablet; Take 0.5-1 tablets (7.5-15 mg total) by mouth daily. Do not take with Aleve, Motrin, Aspirin. -     Splint wrist    The patient is advised to call or return to clinic if she does not see an improvement in symptoms, or to seek the care of the closest emergency department if she worsens with the above  plan.   Philis Fendt, MHS, PA-C [facility] 06/07/2016 1:40 PM

## 2016-06-04 NOTE — Progress Notes (Deleted)
wris

## 2016-06-04 NOTE — Patient Instructions (Signed)
     IF you received an x-ray today, you will receive an invoice from [facility]. Please contact [facility] at [phone] with questions or concerns regarding your invoice.   IF you received labwork today, you will receive an invoice from [facility]. Please contact [facility] at [phone] with questions or concerns regarding your invoice.   Our billing staff will not be able to assist you with questions regarding bills from these companies.  You will be contacted with the lab results as soon as they are available. The fastest way to get your results is to activate your My Chart account. Instructions are located on the last page of this paperwork. If you have not heard from us regarding the results in 2 weeks, please contact this office.      

## 2017-09-12 ENCOUNTER — Encounter: Payer: Managed Care, Other (non HMO) | Admitting: Physician Assistant

## 2017-09-15 ENCOUNTER — Other Ambulatory Visit: Payer: Self-pay

## 2017-09-15 ENCOUNTER — Encounter: Payer: Self-pay | Admitting: Physician Assistant

## 2017-09-15 ENCOUNTER — Ambulatory Visit (INDEPENDENT_AMBULATORY_CARE_PROVIDER_SITE_OTHER): Payer: BLUE CROSS/BLUE SHIELD | Admitting: Physician Assistant

## 2017-09-15 VITALS — BP 170/95 | HR 76 | Temp 98.3°F | Resp 16 | Ht 64.0 in | Wt 172.6 lb

## 2017-09-15 DIAGNOSIS — I10 Essential (primary) hypertension: Secondary | ICD-10-CM | POA: Diagnosis not present

## 2017-09-15 MED ORDER — AMLODIPINE BESYLATE 5 MG PO TABS: 5.0000 mg | ORAL_TABLET | Freq: Every day | ORAL | 0 refills | Status: DC

## 2017-09-15 MED ORDER — LOSARTAN POTASSIUM 50 MG PO TABS: 50.0000 mg | ORAL_TABLET | Freq: Every day | ORAL | 0 refills | Status: DC

## 2017-09-15 NOTE — Patient Instructions (Addendum)
DASH Eating Plan DASH stands for "Dietary Approaches to Stop Hypertension." The DASH eating plan is a healthy eating plan that has been shown to reduce high blood pressure (hypertension). It may also reduce your risk for type 2 diabetes, heart disease, and stroke. The DASH eating plan may also help with weight loss. What are tips for following this plan? General guidelines  Avoid eating more than 2,300 mg (milligrams) of salt (sodium) a day. If you have hypertension, you may need to reduce your sodium intake to 1,500 mg a day.  Limit alcohol intake to no more than 1 drink a day for nonpregnant women and 2 drinks a day for men. One drink equals 12 oz of beer, 5 oz of wine, or 1 oz of hard liquor.  Work with your health care provider to maintain a healthy body weight or to lose weight. Ask what an ideal weight is for you.  Get at least 30 minutes of exercise that causes your heart to beat faster (aerobic exercise) most days of the week. Activities may include walking, swimming, or biking.  Work with your health care provider or diet and nutrition specialist (dietitian) to adjust your eating plan to your individual calorie needs. Reading food labels  Check food labels for the amount of sodium per serving. Choose foods with less than 5 percent of the Daily Value of sodium. Generally, foods with less than 300 mg of sodium per serving fit into this eating plan.  To find whole grains, look for the word "whole" as the first word in the ingredient list. Shopping  Buy products labeled as "low-sodium" or "no salt added."  Buy fresh foods. Avoid canned foods and premade or frozen meals. Cooking  Avoid adding salt when cooking. Use salt-free seasonings or herbs instead of table salt or sea salt. Check with your health care provider or pharmacist before using salt substitutes.  Do not fry foods. Cook foods using healthy methods such as baking, boiling, grilling, and broiling instead.  Cook with  heart-healthy oils, such as olive, canola, soybean, or sunflower oil. Meal planning   Eat a balanced diet that includes: ? 5 or more servings of fruits and vegetables each day. At each meal, try to fill half of your plate with fruits and vegetables. ? Up to 6-8 servings of whole grains each day. ? Less than 6 oz of lean meat, poultry, or fish each day. A 3-oz serving of meat is about the same size as a deck of cards. One egg equals 1 oz. ? 2 servings of low-fat dairy each day. ? A serving of nuts, seeds, or beans 5 times each week. ? Heart-healthy fats. Healthy fats called Omega-3 fatty acids are found in foods such as flaxseeds and coldwater fish, like sardines, salmon, and mackerel.  Limit how much you eat of the following: ? Canned or prepackaged foods. ? Food that is high in trans fat, such as fried foods. ? Food that is high in saturated fat, such as fatty meat. ? Sweets, desserts, sugary drinks, and other foods with added sugar. ? Full-fat dairy products.  Do not salt foods before eating.  Try to eat at least 2 vegetarian meals each week.  Eat more home-cooked food and less restaurant, buffet, and fast food.  When eating at a restaurant, ask that your food be prepared with less salt or no salt, if possible. What foods are recommended? The items listed may not be a complete list. Talk with your dietitian about what   dietary choices are best for you. Grains Whole-grain or whole-wheat bread. Whole-grain or whole-wheat pasta. Brown rice. Oatmeal. Quinoa. Bulgur. Whole-grain and low-sodium cereals. Pita bread. Low-fat, low-sodium crackers. Whole-wheat flour tortillas. Vegetables Fresh or frozen vegetables (raw, steamed, roasted, or grilled). Low-sodium or reduced-sodium tomato and vegetable juice. Low-sodium or reduced-sodium tomato sauce and tomato paste. Low-sodium or reduced-sodium canned vegetables. Fruits All fresh, dried, or frozen fruit. Canned fruit in natural juice (without  added sugar). Meat and other protein foods Skinless chicken or turkey. Ground chicken or turkey. Pork with fat trimmed off. Fish and seafood. Egg whites. Dried beans, peas, or lentils. Unsalted nuts, nut butters, and seeds. Unsalted canned beans. Lean cuts of beef with fat trimmed off. Low-sodium, lean deli meat. Dairy Low-fat (1%) or fat-free (skim) milk. Fat-free, low-fat, or reduced-fat cheeses. Nonfat, low-sodium ricotta or cottage cheese. Low-fat or nonfat yogurt. Low-fat, low-sodium cheese. Fats and oils Soft margarine without trans fats. Vegetable oil. Low-fat, reduced-fat, or light mayonnaise and salad dressings (reduced-sodium). Canola, safflower, olive, soybean, and sunflower oils. Avocado. Seasoning and other foods Herbs. Spices. Seasoning mixes without salt. Unsalted popcorn and pretzels. Fat-free sweets. What foods are not recommended? The items listed may not be a complete list. Talk with your dietitian about what dietary choices are best for you. Grains Baked goods made with fat, such as croissants, muffins, or some breads. Dry pasta or rice meal packs. Vegetables Creamed or fried vegetables. Vegetables in a cheese sauce. Regular canned vegetables (not low-sodium or reduced-sodium). Regular canned tomato sauce and paste (not low-sodium or reduced-sodium). Regular tomato and vegetable juice (not low-sodium or reduced-sodium). Pickles. Olives. Fruits Canned fruit in a light or heavy syrup. Fried fruit. Fruit in cream or butter sauce. Meat and other protein foods Fatty cuts of meat. Ribs. Fried meat. Bacon. Sausage. Bologna and other processed lunch meats. Salami. Fatback. Hotdogs. Bratwurst. Salted nuts and seeds. Canned beans with added salt. Canned or smoked fish. Whole eggs or egg yolks. Chicken or turkey with skin. Dairy Whole or 2% milk, cream, and half-and-half. Whole or full-fat cream cheese. Whole-fat or sweetened yogurt. Full-fat cheese. Nondairy creamers. Whipped toppings.  Processed cheese and cheese spreads. Fats and oils Butter. Stick margarine. Lard. Shortening. Ghee. Bacon fat. Tropical oils, such as coconut, palm kernel, or palm oil. Seasoning and other foods Salted popcorn and pretzels. Onion salt, garlic salt, seasoned salt, table salt, and sea salt. Worcestershire sauce. Tartar sauce. Barbecue sauce. Teriyaki sauce. Soy sauce, including reduced-sodium. Steak sauce. Canned and packaged gravies. Fish sauce. Oyster sauce. Cocktail sauce. Horseradish that you find on the shelf. Ketchup. Mustard. Meat flavorings and tenderizers. Bouillon cubes. Hot sauce and Tabasco sauce. Premade or packaged marinades. Premade or packaged taco seasonings. Relishes. Regular salad dressings. Where to find more information:  National Heart, Lung, and Blood Institute: www.nhlbi.nih.gov  American Heart Association: www.heart.org Summary  The DASH eating plan is a healthy eating plan that has been shown to reduce high blood pressure (hypertension). It may also reduce your risk for type 2 diabetes, heart disease, and stroke.  With the DASH eating plan, you should limit salt (sodium) intake to 2,300 mg a day. If you have hypertension, you may need to reduce your sodium intake to 1,500 mg a day.  When on the DASH eating plan, aim to eat more fresh fruits and vegetables, whole grains, lean proteins, low-fat dairy, and heart-healthy fats.  Work with your health care provider or diet and nutrition specialist (dietitian) to adjust your eating plan to your individual   calorie needs. This information is not intended to replace advice given to you by your health care provider. Make sure you discuss any questions you have with your health care provider. Document Released: 07/01/2011 Document Revised: 07/05/2016 Document Reviewed: 07/05/2016 Elsevier Interactive Patient Education  2018 [employer].     IF you received an x-ray today, you will receive an invoice from [facility].  Please contact [facility] at [phone] with questions or concerns regarding your invoice.   IF you received labwork today, you will receive an invoice from [facility]. Please contact [facility] at [phone] with questions or concerns regarding your invoice.   Our billing staff will not be able to assist you with questions regarding bills from these companies.  You will be contacted with the lab results as soon as they are available. The fastest way to get your results is to activate your My Chart account. Instructions are located on the last page of this paperwork. If you have not heard from [place] regarding the results in 2 weeks, please contact this office.

## 2017-09-15 NOTE — Progress Notes (Signed)
PRIMARY CARE AT [facility] [address], [place] [address] [phone]  Date:  09/15/2017   Name:  Paige Foster   DOB:  07-25-1966   MRN:  [id number]  PCP:  Colon Branch, MD    History of Present Illness:  Paige Foster is a 52 y.o. female patient who presents to PCP with  Chief Complaint  Patient presents with  . Medication Refill    need refill on novasc, losartan potassium       Patient has not had any blood pressure medication for over 1 year.   She was at a employee wellness blood pressure check and was advised that this was extremely high,. No chest pains, palpitations, sob, leg swellings, vision changes, dizziness.  She is eating more red meats again, though she had cut back.  Eating more fried foods at this time.   Patient Active Problem List   Diagnosis Date Noted  . Dyspepsia 11/25/2014  . HTN (hypertension) 07/10/2013  . Annual physical exam 05/26/2012  . HEMORRHOIDS-INTERNAL 04/23/2009  . PERSONAL HX COLONIC POLYPS 04/23/2009  . HEMORRHOID, THROMBOSED 03/12/2009  . BREAST CANCER, HX OF 07/26/2002    Past Medical History:  Diagnosis Date  . Breast CA ([place])    (07/26/2002)--s/p surgery , had Tamoxifen up until 2007 per pt (no XRT-Chemo)  . Colon polyps   . Hypertension     Past Surgical History:  Procedure Laterality Date  . ABDOMINOPLASTY     at time of mastectomy per pt  . BREAST REDUCTION SURGERY Right   . BREAST SURGERY    . COLONOSCOPY    . MASTECTOMY Left    Mastectomy (2004) (left) and  a flap procedure   . TUBAL LIGATION      Social History   Tobacco Use  . Smoking status: Never Smoker  . Smokeless tobacco: Never Used  Substance Use Topics  . Alcohol use: Yes    Alcohol/week: 0.0 oz    Comment: Occassionally  . Drug use: No    Family History  Problem Relation Age of Onset  . Diabetes Mother   . Kidney disease Father   . Colon cancer Neg Hx   . Colon polyps Neg Hx   . Gallbladder disease Neg Hx   . Heart disease Neg Hx    . Esophageal cancer Neg Hx     No Known Allergies  Medication list has been reviewed and updated.  Current Outpatient Medications on File Prior to Visit  Medication Sig Dispense Refill  . amLODipine (NORVASC) 5 MG tablet Take 1 tablet (5 mg total) by mouth daily. 30 tablet 0  . losartan (COZAAR) 50 MG tablet Take 1 tablet (50 mg total) by mouth daily. 30 tablet 0  . meloxicam (MOBIC) 15 MG tablet Take 0.5-1 tablets (7.5-15 mg total) by mouth daily. Do not take with Aleve, Motrin, Aspirin. (Patient not taking: Reported on 09/15/2017) 60 tablet 0  . pantoprazole (PROTONIX) 40 MG tablet Take 1 tablet (40 mg total) by mouth daily. (Patient not taking: Reported on 09/15/2017) 30 tablet 3   No current facility-administered medications on file prior to visit.     ROS ROS otherwise unremarkable unless listed above.  Physical Examination: BP (!) 170/95   Pulse 76   Temp 98.3 F (36.8 C)   Resp 16   Ht _0  (1.626 m)   Wt 172 lb 9.6 oz (78.3 kg)   SpO2 99%   BMI 29.63 kg/m  Ideal Body Weight: Weight  in (lb) to have BMI = 25: 145.3  Physical Exam  Constitutional: She is oriented to person, place, and time. She appears well-developed and well-nourished. No distress.  HENT:  Head: Normocephalic and atraumatic.  Right Ear: External ear normal.  Left Ear: External ear normal.  Eyes: Conjunctivae and EOM are normal. Pupils are equal, round, and reactive to light.  Cardiovascular: Normal rate and regular rhythm.  Pulmonary/Chest: Effort normal. No respiratory distress.  Neurological: She is alert and oriented to person, place, and time.  Skin: She is not diaphoretic.  Psychiatric: She has a normal mood and affect. Her behavior is normal.     Assessment and Plan: Paige Foster is a 52 y.o. female who is here today for cc of  Chief Complaint  Patient presents with  . Medication Refill    need refill on novasc, losartan potassium   patient is here today for blood pressure  medication refill.   Advised to follow up for recheck of blood pressure. Dash diet given Advised to follow up in 3 weeks for blood pressure recheck and physical exam.  Essential hypertension - Plan: CMP14+EGFR, amLODipine (NORVASC) 5 MG tablet, losartan (COZAAR) 50 MG tablet  [name], PA-C [facility] 3/1/20194:35 PM

## 2017-09-16 LAB — CMP14+EGFR
A/G RATIO: 1.5 (ref 1.2–2.2)
ALT: 15 IU/L (ref 0–32)
AST: 16 IU/L (ref 0–40)
Albumin: 4.5 g/dL (ref 3.5–5.5)
Alkaline Phosphatase: 109 IU/L (ref 39–117)
BUN/Creatinine Ratio: 22 (ref 9–23)
BUN: 14 mg/dL (ref 6–24)
CHLORIDE: 105 mmol/L (ref 96–106)
CO2: 22 mmol/L (ref 20–29)
Calcium: 9.8 mg/dL (ref 8.7–10.2)
Creatinine, Ser: 0.64 mg/dL (ref 0.57–1.00)
GFR calc Af Amer: 118 mL/min/{1.73_m2} (ref >59)
GFR calc non Af Amer: 102 mL/min/{1.73_m2} (ref >59)
GLOBULIN, TOTAL: 3 g/dL (ref 1.5–4.5)
Glucose: 93 mg/dL (ref 65–99)
POTASSIUM: 3.8 mmol/L (ref 3.5–5.2)
SODIUM: 140 mmol/L (ref 134–144)
Total Protein: 7.5 g/dL (ref 6.0–8.5)

## 2017-09-23 ENCOUNTER — Encounter: Payer: Self-pay | Admitting: Physician Assistant

## 2017-10-06 ENCOUNTER — Encounter: Payer: Self-pay | Admitting: Radiology

## 2017-10-12 ENCOUNTER — Ambulatory Visit (INDEPENDENT_AMBULATORY_CARE_PROVIDER_SITE_OTHER): Payer: BLUE CROSS/BLUE SHIELD | Admitting: Physician Assistant

## 2017-10-12 ENCOUNTER — Encounter: Payer: Self-pay | Admitting: Physician Assistant

## 2017-10-12 VITALS — BP 150/90 | HR 75 | Temp 98.0°F | Resp 16 | Ht 63.0 in | Wt 171.4 lb

## 2017-10-12 DIAGNOSIS — Z1329 Encounter for screening for other suspected endocrine disorder: Secondary | ICD-10-CM

## 2017-10-12 DIAGNOSIS — Z0000 Encounter for general adult medical examination without abnormal findings: Secondary | ICD-10-CM | POA: Diagnosis not present

## 2017-10-12 DIAGNOSIS — Z13220 Encounter for screening for lipoid disorders: Secondary | ICD-10-CM

## 2017-10-12 DIAGNOSIS — Z130 Encounter for screening for diseases of the blood and blood-forming organs and certain disorders involving the immune mechanism: Secondary | ICD-10-CM

## 2017-10-12 DIAGNOSIS — J069 Acute upper respiratory infection, unspecified: Secondary | ICD-10-CM

## 2017-10-12 DIAGNOSIS — Z13228 Encounter for screening for other metabolic disorders: Secondary | ICD-10-CM

## 2017-10-12 MED ORDER — IPRATROPIUM BROMIDE 0.03 % NA SOLN: 2.0000 | Freq: Two times a day (BID) | NASAL | 0 refills | Status: DC

## 2017-10-12 NOTE — Patient Instructions (Addendum)
I will give you some more time to practice proper eating habits such as lowering your sodium intake.  Please refer below.  We will then recheck it, but if it is high, we will make a slight increase to the blood pressure medication.    DASH Eating Plan DASH stands for "Dietary Approaches to Stop Hypertension." The DASH eating plan is a healthy eating plan that has been shown to reduce high blood pressure (hypertension). It may also reduce your risk for type 2 diabetes, heart disease, and stroke. The DASH eating plan may also help with weight loss. What are tips for following this plan? General guidelines  Avoid eating more than 2,300 mg (milligrams) of salt (sodium) a day. If you have hypertension, you may need to reduce your sodium intake to 1,500 mg a day.  Limit alcohol intake to no more than 1 drink a day for nonpregnant women and 2 drinks a day for men. One drink equals 12 oz of beer, 5 oz of wine, or 1 oz of hard liquor.  Work with your health care provider to maintain a healthy body weight or to lose weight. Ask what an ideal weight is for you.  Get at least 30 minutes of exercise that causes your heart to beat faster (aerobic exercise) most days of the week. Activities may include walking, swimming, or biking.  Work with your health care provider or diet and nutrition specialist (dietitian) to adjust your eating plan to your individual calorie needs. Reading food labels  Check food labels for the amount of sodium per serving. Choose foods with less than 5 percent of the Daily Value of sodium. Generally, foods with less than 300 mg of sodium per serving fit into this eating plan.  To find whole grains, look for the word "whole" as the first word in the ingredient list. Shopping  Buy products labeled as "low-sodium" or "no salt added."  Buy fresh foods. Avoid canned foods and premade or frozen meals. Cooking  Avoid adding salt when cooking. Use salt-free seasonings or herbs instead  of table salt or sea salt. Check with your health care provider or pharmacist before using salt substitutes.  Do not fry foods. Cook foods using healthy methods such as baking, boiling, grilling, and broiling instead.  Cook with heart-healthy oils, such as olive, canola, soybean, or sunflower oil. Meal planning   Eat a balanced diet that includes: ? 5 or more servings of fruits and vegetables each day. At each meal, try to fill half of your plate with fruits and vegetables. ? Up to 6-8 servings of whole grains each day. ? Less than 6 oz of lean meat, poultry, or fish each day. A 3-oz serving of meat is about the same size as a deck of cards. One egg equals 1 oz. ? 2 servings of low-fat dairy each day. ? A serving of nuts, seeds, or beans 5 times each week. ? Heart-healthy fats. Healthy fats called Omega-3 fatty acids are found in foods such as flaxseeds and coldwater fish, like sardines, salmon, and mackerel.  Limit how much you eat of the following: ? Canned or prepackaged foods. ? Food that is high in trans fat, such as fried foods. ? Food that is high in saturated fat, such as fatty meat. ? Sweets, desserts, sugary drinks, and other foods with added sugar. ? Full-fat dairy products.  Do not salt foods before eating.  Try to eat at least 2 vegetarian meals each week.  Eat more home-cooked  food and less restaurant, buffet, and fast food.  When eating at a restaurant, ask that your food be prepared with less salt or no salt, if possible. What foods are recommended? The items listed may not be a complete list. Talk with your dietitian about what dietary choices are best for you. Grains Whole-grain or whole-wheat bread. Whole-grain or whole-wheat pasta. Brown rice. Oatmeal. Quinoa. Bulgur. Whole-grain and low-sodium cereals. Pita bread. Low-fat, low-sodium crackers. Whole-wheat flour tortillas. Vegetables Fresh or frozen vegetables (raw, steamed, roasted, or grilled). Low-sodium or  reduced-sodium tomato and vegetable juice. Low-sodium or reduced-sodium tomato sauce and tomato paste. Low-sodium or reduced-sodium canned vegetables. Fruits All fresh, dried, or frozen fruit. Canned fruit in natural juice (without added sugar). Meat and other protein foods Skinless chicken or turkey. Ground chicken or turkey. Pork with fat trimmed off. Fish and seafood. Egg whites. Dried beans, peas, or lentils. Unsalted nuts, nut butters, and seeds. Unsalted canned beans. Lean cuts of beef with fat trimmed off. Low-sodium, lean deli meat. Dairy Low-fat (1%) or fat-free (skim) milk. Fat-free, low-fat, or reduced-fat cheeses. Nonfat, low-sodium ricotta or cottage cheese. Low-fat or nonfat yogurt. Low-fat, low-sodium cheese. Fats and oils Soft margarine without trans fats. Vegetable oil. Low-fat, reduced-fat, or light mayonnaise and salad dressings (reduced-sodium). Canola, safflower, olive, soybean, and sunflower oils. Avocado. Seasoning and other foods Herbs. Spices. Seasoning mixes without salt. Unsalted popcorn and pretzels. Fat-free sweets. What foods are not recommended? The items listed may not be a complete list. Talk with your dietitian about what dietary choices are best for you. Grains Baked goods made with fat, such as croissants, muffins, or some breads. Dry pasta or rice meal packs. Vegetables Creamed or fried vegetables. Vegetables in a cheese sauce. Regular canned vegetables (not low-sodium or reduced-sodium). Regular canned tomato sauce and paste (not low-sodium or reduced-sodium). Regular tomato and vegetable juice (not low-sodium or reduced-sodium). Pickles. Olives. Fruits Canned fruit in a light or heavy syrup. Fried fruit. Fruit in cream or butter sauce. Meat and other protein foods Fatty cuts of meat. Ribs. Fried meat. Bacon. Sausage. Bologna and other processed lunch meats. Salami. Fatback. Hotdogs. Bratwurst. Salted nuts and seeds. Canned beans with added salt. Canned or  smoked fish. Whole eggs or egg yolks. Chicken or turkey with skin. Dairy Whole or 2% milk, cream, and half-and-half. Whole or full-fat cream cheese. Whole-fat or sweetened yogurt. Full-fat cheese. Nondairy creamers. Whipped toppings. Processed cheese and cheese spreads. Fats and oils Butter. Stick margarine. Lard. Shortening. Ghee. Bacon fat. Tropical oils, such as coconut, palm kernel, or palm oil. Seasoning and other foods Salted popcorn and pretzels. Onion salt, garlic salt, seasoned salt, table salt, and sea salt. Worcestershire sauce. Tartar sauce. Barbecue sauce. Teriyaki sauce. Soy sauce, including reduced-sodium. Steak sauce. Canned and packaged gravies. Fish sauce. Oyster sauce. Cocktail sauce. Horseradish that you find on the shelf. Ketchup. Mustard. Meat flavorings and tenderizers. Bouillon cubes. Hot sauce and Tabasco sauce. Premade or packaged marinades. Premade or packaged taco seasonings. Relishes. Regular salad dressings. Where to find more information:  National Heart, Lung, and Blood Institute: www.nhlbi.nih.gov  American Heart Association: www.heart.org Summary  The DASH eating plan is a healthy eating plan that has been shown to reduce high blood pressure (hypertension). It may also reduce your risk for type 2 diabetes, heart disease, and stroke.  With the DASH eating plan, you should limit salt (sodium) intake to 2,300 mg a day. If you have hypertension, you may need to reduce your sodium intake to 1,500 mg   a day.  When on the DASH eating plan, aim to eat more fresh fruits and vegetables, whole grains, lean proteins, low-fat dairy, and heart-healthy fats.  Work with your health care provider or diet and nutrition specialist (dietitian) to adjust your eating plan to your individual calorie needs. This information is not intended to replace advice given to you by your health care provider. Make sure you discuss any questions you have with your health care provider. Document  Released: 07/01/2011 Document Revised: 07/05/2016 Document Reviewed: 07/05/2016 Elsevier Interactive Patient Education  2018 [place] [employer]  Get These Tests  Blood Pressure- Have your blood pressure checked by your healthcare provider at least once a year.  Normal blood pressure is 120/80.  Weight- Have your body mass index (BMI) calculated to screen for obesity.  BMI is a measure of body fat based on height and weight.  You can calculate your own BMI at [employer]velBags.it  Cholesterol- Have your cholesterol checked every year.  Diabetes- Have your blood sugar checked every year if you have high blood pressure, high cholesterol, a family history of diabetes or if you are overweight.  Pap Test - Have a pap test every 1 to 5 years if you have been sexually active.  If you are older than 65 and recent pap tests have been normal you may not need additional pap tests.  In addition, if you have had a hysterectomy  for benign disease additional pap tests are not necessary.  Mammogram-Yearly mammograms are essential for early detection of breast cancer  Screening for Colon Cancer- Colonoscopy starting at age 93. Screening may begin sooner depending on your family history and other health conditions.  Follow up colonoscopy as directed by your Gastroenterologist.  Screening for Osteoporosis- Screening begins at age 42 with bone density scanning, sooner if you are at higher risk for developing Osteoporosis.  Get these medicines  Calcium with Vitamin D- Your body requires 1200-1500 mg of Calcium a day and [phone] IU of Vitamin D a day.  You can only absorb 500 mg of Calcium at a time therefore Calcium must be taken in 2 or 3 separate doses throughout the day.  Hormones- Hormone therapy has been associated with increased risk for certain cancers and heart disease.  Talk to your healthcare provider about if you need relief from menopausal symptoms.  Aspirin- Ask your  healthcare provider about taking Aspirin to prevent Heart Disease and Stroke.  Get these Immuniztions  Flu shot- Every fall  Pneumonia shot- Once after the age of 2; if you are younger ask your healthcare provider if you need a pneumonia shot.  Tetanus- Every ten years.  Zostavax- Once after the age of 41 to prevent shingles.  Take these steps  Don't smoke- Your healthcare provider can help you quit. For tips on how to quit, ask your healthcare provider or go to www.smokefree.gov or call 1-800 QUIT-NOW.  Be physically active- Exercise 5 days a week for a minimum of 30 minutes.  If you are not already physically active, start slow and gradually work up to 30 minutes of moderate physical activity.  Try walking, dancing, bike riding, swimming, etc.  Eat a healthy diet- Eat a variety of healthy foods such as fruits, vegetables, whole grains, low fat milk, low fat cheeses, yogurt, lean meats, chicken, fish, eggs, dried beans, tofu, etc.  For more information go to www.thenutritionsource.org  Dental visit- Brush and floss teeth twice daily; visit your dentist twice a year.  Eye exam- Visit your Optometrist or Ophthalmologist yearly.  Drink alcohol in moderation- Limit alcohol intake to one drink or less a day.  Never drink and drive.  Depression- Your emotional health is as important as your physical health.  If you're feeling down or losing interest in things you normally enjoy, please talk to your healthcare provider.  Seat Belts- can save your life; always wear one  Smoke/Carbon Monoxide detectors- These detectors need to be installed on the appropriate level of your home.  Replace batteries at least once a year.  Violence- If anyone is threatening or hurting you, please tell your healthcare provider.  Living Will/ Health care power of attorney- Discuss with your healthcare provider and family.    IF you received an x-ray today, you will receive an invoice from [facility]  Radiology. Please contact [facility] at [phone] with questions or concerns regarding your invoice.   IF you received labwork today, you will receive an invoice from [facility]. Please contact [facility] at [phone] with questions or concerns regarding your invoice.   Our billing staff will not be able to assist you with questions regarding bills from these companies.  You will be contacted with the lab results as soon as they are available. The fastest way to get your results is to activate your My Chart account. Instructions are located on the last page of this paperwork. If you have not heard from [place] regarding the results in 2 weeks, please contact this office.

## 2017-10-12 NOTE — Progress Notes (Signed)
PRIMARY CARE AT [facility] [address], [place] [address] [phone]  Date:  10/12/2017   Name:  Paige Foster   DOB:  [birth year]   MRN:  [id number]  PCP:  Colon Branch, MD    History of Present Illness:  Paige Foster is a 52 y.o. female patient who presents to PCP with  Chief Complaint  Patient presents with  . Annual Exam     DIET: diet consist of breads, chicken, beef.  No pork.  Eggs, vegetables not daily.  Water intake: 16 oz of water per day.  Soda intake 1 can per day  BM: no constipation or diarrhea.  No black or bloody stool.   URINATION: no dysuria, hematuria, or frequency  SLEEP: ok.  5 hours per day due to 12 hour shifts, and getting straight with home stuff  SOCIAL ACTIVITY: no exercise.  Lives with husband and 2 boys--84/27years old.    MENSES: c section.  Last mammogram 2018 [place].  [place].    She is taking the bp meds in the mroning.  She does not notice any side effects to the medication.  She ahs not been practicing the [place] given to her 4 weeks ago.   Patient has mild complaint of congestion, and runny nose with ROS.   Patient Active Problem List   Diagnosis Date Noted  . Dyspepsia 11/25/2014  . HTN (hypertension) 07/10/2013  . Annual physical exam 05/26/2012  . HEMORRHOIDS-INTERNAL 04/23/2009  . PERSONAL HX COLONIC POLYPS 04/23/2009  . HEMORRHOID, THROMBOSED 03/12/2009  . BREAST CANCER, HX OF 07/26/2002    Past Medical History:  Diagnosis Date  . Breast CA ([place])    (07/26/2002)--s/p surgery , had Tamoxifen up until 2007 per pt (no XRT-Chemo)  . Colon polyps   . Hypertension     Past Surgical History:  Procedure Laterality Date  . ABDOMINOPLASTY     at time of mastectomy per pt  . BREAST REDUCTION SURGERY Right   . BREAST SURGERY    . COLONOSCOPY    . MASTECTOMY Left    Mastectomy (2004) (left) and  a flap procedure   . TUBAL LIGATION      Social History   Tobacco Use  . Smoking status: Never Smoker  . Smokeless  tobacco: Never Used  Substance Use Topics  . Alcohol use: Yes    Alcohol/week: 0.0 oz    Comment: Occassionally  . Drug use: No    Family History  Problem Relation Age of Onset  . Diabetes Mother   . Kidney disease Father   . Colon cancer Neg Hx   . Colon polyps Neg Hx   . Gallbladder disease Neg Hx   . Heart disease Neg Hx   . Esophageal cancer Neg Hx     No Known Allergies  Medication list has been reviewed and updated.  Current Outpatient Medications on File Prior to Visit  Medication Sig Dispense Refill  . amLODipine (NORVASC) 5 MG tablet Take 1 tablet (5 mg total) by mouth daily. 90 tablet 0  . losartan (COZAAR) 50 MG tablet Take 1 tablet (50 mg total) by mouth daily. 90 tablet 0  . meloxicam (MOBIC) 15 MG tablet Take 0.5-1 tablets (7.5-15 mg total) by mouth daily. Do not take with Aleve, Motrin, Aspirin. 60 tablet 0  . pantoprazole (PROTONIX) 40 MG tablet Take 1 tablet (40 mg total) by mouth daily. 30 tablet 3   No current facility-administered medications on file prior to  visit.     Review of Systems  Constitutional: Negative for chills and fever.  HENT: Negative for ear discharge, ear pain and sore throat.   Eyes: Negative for blurred vision and double vision.  Respiratory: Negative for cough, shortness of breath and wheezing.   Cardiovascular: Negative for chest pain, palpitations and leg swelling.  Gastrointestinal: Negative for diarrhea, nausea and vomiting.  Genitourinary: Negative for dysuria, frequency and hematuria.  Skin: Negative for itching and rash.  Neurological: Negative for dizziness and headaches.   ROS otherwise unremarkable unless listed above.  Physical Examination: BP (!) 150/90 (BP Location: Right Arm, Patient Position: Sitting, Cuff Size: Large)   Pulse 75   Temp 98 F (36.7 C) (Oral)   Resp 16   Ht '5\' 3"'$  (1.6 m)   Wt 171 lb 6.4 oz (77.7 kg)   SpO2 98%   BMI 30.36 kg/m  Ideal Body Weight: Weight in (lb) to have BMI = 25:  140.8  Physical Exam  Constitutional: She is oriented to person, place, and time. She appears well-developed and well-nourished. No distress.  HENT:  Head: Normocephalic and atraumatic.  Right Ear: Tympanic membrane, external ear and ear canal normal.  Left Ear: Tympanic membrane, external ear and ear canal normal.  Nose: No mucosal edema or rhinorrhea. Right sinus exhibits no maxillary sinus tenderness and no frontal sinus tenderness. Left sinus exhibits no maxillary sinus tenderness and no frontal sinus tenderness.  Mouth/Throat: Oropharynx is clear and moist. No uvula swelling. No oropharyngeal exudate, posterior oropharyngeal edema or posterior oropharyngeal erythema.  Eyes: Conjunctivae and EOM are normal. Pupils are equal, round, and reactive to light.  Neck: Normal range of motion. Neck supple. No thyromegaly present.  Cardiovascular: Normal rate, regular rhythm, normal heart sounds and intact distal pulses. Exam reveals no gallop, no distant heart sounds and no friction rub.  No murmur heard. Pulmonary/Chest: Effort normal and breath sounds normal. No respiratory distress. She has no decreased breath sounds. She has no wheezes. She has no rhonchi.  Abdominal: Soft. Normal appearance and bowel sounds are normal. She exhibits no distension and no mass. There is no tenderness.  Musculoskeletal: Normal range of motion. She exhibits no edema or tenderness.  Lymphadenopathy:       Head (right side): No submandibular, no tonsillar, no preauricular and no posterior auricular adenopathy present.       Head (left side): No submandibular, no tonsillar, no preauricular and no posterior auricular adenopathy present.    She has no cervical adenopathy.  Neurological: She is alert and oriented to person, place, and time. No cranial nerve deficit. She exhibits normal muscle tone. Coordination normal.  Skin: Skin is warm and dry. She is not diaphoretic.  Psychiatric: She has a normal mood and affect.  Her behavior is normal.     Assessment and Plan: Paige Foster is a 52 y.o. female who is here today for cc of  Chief Complaint  Patient presents with  . Annual Exam   Annual physical exam - Plan: TSH, CBC, CMP14+EGFR, Lipid panel  Screening for thyroid disorder - Plan: TSH  Screening for deficiency anemia - Plan: CBC  Screening for metabolic disorder - Plan: CMP14+EGFR  Screening for lipid disorders - Plan: Lipid panel  Acute upper respiratory infection - Plan: ipratropium (ATROVENT) 0.03 % nasal spray  [name], PA-C [facility] 3/20/20199:02 AM

## 2017-10-13 LAB — CMP14+EGFR
ALK PHOS: 107 IU/L (ref 39–117)
ALT: 17 IU/L (ref 0–32)
AST: 17 IU/L (ref 0–40)
Albumin/Globulin Ratio: 1.3 (ref 1.2–2.2)
Albumin: 4.3 g/dL (ref 3.5–5.5)
BUN/Creatinine Ratio: 17 (ref 9–23)
BUN: 10 mg/dL (ref 6–24)
CHLORIDE: 102 mmol/L (ref 96–106)
CO2: 23 mmol/L (ref 20–29)
CREATININE: 0.58 mg/dL (ref 0.57–1.00)
Calcium: 9.9 mg/dL (ref 8.7–10.2)
GFR calc Af Amer: 122 mL/min/{1.73_m2} (ref >59)
GFR calc non Af Amer: 106 mL/min/{1.73_m2} (ref >59)
GLUCOSE: 96 mg/dL (ref 65–99)
Globulin, Total: 3.2 g/dL (ref 1.5–4.5)
Potassium: 3.7 mmol/L (ref 3.5–5.2)
SODIUM: 140 mmol/L (ref 134–144)
Total Protein: 7.5 g/dL (ref 6.0–8.5)

## 2017-10-13 LAB — CBC
Hematocrit: 40.6 % (ref 34.0–46.6)
Hemoglobin: 13.4 g/dL (ref 11.1–15.9)
MCH: 30.5 pg (ref 26.6–33.0)
MCHC: 33 g/dL (ref 31.5–35.7)
MCV: 93 fL (ref 79–97)
Platelets: 307 10*3/uL (ref 150–379)
RBC: 4.39 x10E6/uL (ref 3.77–5.28)
RDW: 13.4 % (ref 12.3–15.4)
WBC: 5.9 10*3/uL (ref 3.4–10.8)

## 2017-10-13 LAB — LIPID PANEL
Chol/HDL Ratio: 6.1 ratio — ABNORMAL HIGH (ref 0.0–4.4)
Cholesterol, Total: 291 mg/dL — ABNORMAL HIGH (ref 100–199)
HDL: 48 mg/dL (ref >39)
LDL CALC: 206 mg/dL — AB (ref 0–99)
TRIGLYCERIDES: 185 mg/dL — AB (ref 0–149)
VLDL Cholesterol Cal: 37 mg/dL (ref 5–40)

## 2017-10-13 LAB — TSH: TSH: 1.12 u[IU]/mL (ref 0.450–4.500)

## 2017-10-26 ENCOUNTER — Encounter: Payer: Self-pay | Admitting: Physician Assistant

## 2017-11-02 ENCOUNTER — Other Ambulatory Visit: Payer: Self-pay | Admitting: Physician Assistant

## 2017-11-02 DIAGNOSIS — E782 Mixed hyperlipidemia: Secondary | ICD-10-CM

## 2017-11-02 MED ORDER — ATORVASTATIN CALCIUM 40 MG PO TABS: 40.0000 mg | ORAL_TABLET | Freq: Every day | ORAL | 3 refills | Status: DC

## 2017-11-07 ENCOUNTER — Ambulatory Visit: Payer: BLUE CROSS/BLUE SHIELD | Admitting: Physician Assistant

## 2017-12-21 ENCOUNTER — Telehealth: Payer: Self-pay | Admitting: Internal Medicine

## 2017-12-21 NOTE — Telephone Encounter (Signed)
Copied from [place] [id number]. Topic: Quick Communication - Rx Refill/Question >> Dec 21, 2017  6:22 PM Tye [place] wrote: Medication: amLODipine (NORVASC) 5 MG tablet [[id number]]   Has the patient contacted their pharmacy? Yes.   (Agent: If no, request that the patient contact the pharmacy for the refill.) (Agent: If yes, when and what did the pharmacy advise?)  Preferred Pharmacy (with phone number or street name): [facility]  Agent: Please be advised that RX refills may take up to 3 business days. We ask that you follow-up with your pharmacy.

## 2017-12-22 ENCOUNTER — Other Ambulatory Visit: Payer: Self-pay

## 2017-12-22 DIAGNOSIS — I10 Essential (primary) hypertension: Secondary | ICD-10-CM

## 2017-12-22 MED ORDER — AMLODIPINE BESYLATE 5 MG PO TABS: 5.0000 mg | ORAL_TABLET | Freq: Every day | ORAL | 0 refills | Status: DC

## 2018-01-02 ENCOUNTER — Telehealth: Payer: Self-pay | Admitting: Internal Medicine

## 2018-01-02 DIAGNOSIS — R2232 Localized swelling, mass and lump, left upper limb: Secondary | ICD-10-CM

## 2018-01-02 NOTE — Telephone Encounter (Signed)
Paige Foster, please send request to new PCP, she has seen [place] PA.

## 2018-01-02 NOTE — Telephone Encounter (Signed)
Copied from [place] [id number]. Topic: General - Other >> Jan 02, 2018  3:45 PM Keene Breath wrote: Reason for CRM: Tuesday, Patient Svc. Rep. With [facility] called requesting verbal order for diagnostic on left breast with ultra sound due to a lump found under left armpit.  CB#[id number], ext. [phone],  Fax# [phone].

## 2018-01-03 ENCOUNTER — Encounter: Payer: Self-pay | Admitting: Internal Medicine

## 2018-01-03 LAB — HM MAMMOGRAPHY

## 2018-01-03 NOTE — Telephone Encounter (Signed)
I'm okay with a verbal here. Please coordinate with [place] department.

## 2018-01-03 NOTE — Telephone Encounter (Signed)
Phone call to [facility], spoke with Janett Billow. Verbal given. She is also requesting a faxed order. Order generated and faxed.

## 2018-01-06 ENCOUNTER — Encounter: Payer: Self-pay | Admitting: Internal Medicine

## 2018-01-09 ENCOUNTER — Ambulatory Visit: Payer: Self-pay | Admitting: Internal Medicine

## 2018-01-09 DIAGNOSIS — Z0289 Encounter for other administrative examinations: Secondary | ICD-10-CM

## 2018-02-28 ENCOUNTER — Other Ambulatory Visit: Payer: Self-pay | Admitting: Physician Assistant

## 2018-02-28 DIAGNOSIS — I10 Essential (primary) hypertension: Secondary | ICD-10-CM

## 2018-02-28 NOTE — Telephone Encounter (Signed)
Patient had appointment 10/12/17- was supposed to f/u for BP 11/02/17- never did. No future appointment and no follow up at other office- no showed NP appointment. Not sure what patient is doing.  Rx refill request: amlodipine 5 mg           Last filled: 12/22/17  LOV: 10/12/17  PCP: last filled [place]: verified

## 2018-03-14 ENCOUNTER — Telehealth: Payer: Self-pay | Admitting: General Practice

## 2018-03-14 NOTE — Telephone Encounter (Signed)
Please advise 

## 2018-03-14 NOTE — Telephone Encounter (Signed)
Re-schedule a new patient appointment at her convenience, until then , cannot provide medical advice

## 2018-03-14 NOTE — Telephone Encounter (Signed)
Pt walked in a 4:53 wanting to make appt. She said her provider was Dr. Larose Kells. I checked and she no showed on 01/09/18. I explained I would have to get permission to make her an appt or let her know if she would have to see someone else who is accepting new patients. Please advise if Dr. Larose Kells will still accept this new patient. I told her I will call her and let her know his response.

## 2018-03-15 NOTE — Telephone Encounter (Signed)
Called pt and LVM regarding her request to set up another appt to est care with Dr. Larose Kells. Informed pt that Dr. Larose Kells approved the request and advised pt to call back to schedule a new appt.

## 2018-04-06 ENCOUNTER — Telehealth: Payer: Self-pay

## 2018-04-06 NOTE — Telephone Encounter (Signed)
Doctor order faxed to [facility] for procedure scheduled or today. Signed by Dr. Pamella Pert

## 2018-04-07 ENCOUNTER — Telehealth: Payer: Self-pay | Admitting: *Deleted

## 2018-04-07 NOTE — Telephone Encounter (Signed)
Received Physician Orders from [employer]; forwarded to provider/SLS 09/13

## 2018-04-11 NOTE — Telephone Encounter (Signed)
Signed and faxed to [facility] at [phone]. Form sent for scanning.

## 2018-05-22 ENCOUNTER — Encounter: Payer: Self-pay | Admitting: Nurse Practitioner

## 2018-05-22 ENCOUNTER — Ambulatory Visit (INDEPENDENT_AMBULATORY_CARE_PROVIDER_SITE_OTHER): Payer: BLUE CROSS/BLUE SHIELD | Admitting: Nurse Practitioner

## 2018-05-22 VITALS — BP 180/108 | HR 105 | Temp 98.6°F | Ht 63.5 in | Wt 167.6 lb

## 2018-05-22 DIAGNOSIS — I10 Essential (primary) hypertension: Secondary | ICD-10-CM

## 2018-05-22 DIAGNOSIS — E782 Mixed hyperlipidemia: Secondary | ICD-10-CM | POA: Diagnosis not present

## 2018-05-22 MED ORDER — LOSARTAN POTASSIUM 50 MG PO TABS
50.0000 mg | ORAL_TABLET | Freq: Every day | ORAL | 0 refills | Status: DC
Start: 2018-05-22 — End: 2018-08-25

## 2018-05-22 MED ORDER — AMLODIPINE BESYLATE 5 MG PO TABS: 5.0000 mg | ORAL_TABLET | Freq: Every day | ORAL | 0 refills | Status: DC

## 2018-05-22 NOTE — Progress Notes (Signed)
Subjective:  Patient ID: Paige Foster, female    DOB: 11/28/1965  Age: 52 y.o. MRN: [id number]  CC: Establish Care (est care/meds refill : losartan and amlodipine--BP machine is too old at home. get nervous when in the doc office. FYI--pt has a cold--getting better she believed. )  HPI   HTN: Uncontrolled. Out of losartan and amlodipine doe over 68month. Took cold medication with decongestant yesterday. BP Readings from Last 3 Encounters:  05/22/18 (!) 180/108  10/12/17 (!) 150/90  09/15/17 (!) 170/95    Hyperlipidemia: Does not want to take lipitor. Will like to improve numbers through diet and exercise. Lipid Panel     Component Value Date/Time   CHOL 291 (H) 10/12/2017 0950   TRIG 185 (H) 10/12/2017 0950   HDL 48 10/12/2017 0950   CHOLHDL 6.1 (H) 10/12/2017 0950   CHOLHDL 6 05/29/2012 0856   VLDL 25.4 05/29/2012 0856   LDLCALC 206 (H) 10/12/2017 0950   LDLDIRECT 176.0 05/29/2012 0856   Reviewed past Medical, Social and Family history today.  Outpatient Medications Prior to Visit  Medication Sig Dispense Refill  . amLODipine (NORVASC) 5 MG tablet Take 1 tablet (5 mg total) by mouth daily. 30 tablet 0  . losartan (COZAAR) 50 MG tablet Take 1 tablet (50 mg total) by mouth daily. 90 tablet 0  . atorvastatin (LIPITOR) 40 MG tablet Take 1 tablet (40 mg total) by mouth daily. Start half tablet (20mg ) daily for 2 weeks.  Then increase to the full tablet daily. (Patient not taking: Reported on 05/22/2018) 90 tablet 3  . ipratropium (ATROVENT) 0.03 % nasal spray Place 2 sprays into both nostrils 2 (two) times daily. (Patient not taking: Reported on 05/22/2018) 30 mL 0  . meloxicam (MOBIC) 15 MG tablet Take 0.5-1 tablets (7.5-15 mg total) by mouth daily. Do not take with Aleve, Motrin, Aspirin. (Patient not taking: Reported on 05/22/2018) 60 tablet 0  . pantoprazole (PROTONIX) 40 MG tablet Take 1 tablet (40 mg total) by mouth daily. (Patient not taking: Reported on 05/22/2018) 30  tablet 3   No facility-administered medications prior to visit.     ROS See HPI  Objective:  BP (!) 180/108   Pulse (!) 105   Temp 98.6 F (37 C) (Oral)   Ht 5' 3.5" (1.613 m)   Wt 167 lb 9.6 oz (76 kg)   SpO2 97%   BMI 29.22 kg/m   BP Readings from Last 3 Encounters:  05/22/18 (!) 180/108  10/12/17 (!) 150/90  09/15/17 (!) 170/95    Wt Readings from Last 3 Encounters:  05/22/18 167 lb 9.6 oz (76 kg)  10/12/17 171 lb 6.4 oz (77.7 kg)  09/15/17 172 lb 9.6 oz (78.3 kg)    Physical Exam  Constitutional: She is oriented to person, place, and time.  Neck: No JVD present.  Cardiovascular: Normal rate and regular rhythm.  Pulmonary/Chest: Effort normal and breath sounds normal.  Musculoskeletal: She exhibits no edema.  Neurological: She is alert and oriented to person, place, and time.  Skin: Skin is warm and dry.  Psychiatric: She has a normal mood and affect. Her behavior is normal.  Vitals reviewed.   Lab Results  Component Value Date   WBC 5.9 10/12/2017   HGB 13.4 10/12/2017   HCT 40.6 10/12/2017   PLT 307 10/12/2017   GLUCOSE 96 10/12/2017   CHOL 291 (H) 10/12/2017   TRIG 185 (H) 10/12/2017   HDL 48 10/12/2017   LDLDIRECT 176.0 05/29/2012  LDLCALC 206 (H) 10/12/2017   ALT 17 10/12/2017   AST 17 10/12/2017   NA 140 10/12/2017   K 3.7 10/12/2017   CL 102 10/12/2017   CREATININE 0.58 10/12/2017   BUN 10 10/12/2017   CO2 23 10/12/2017   TSH 1.120 10/12/2017    No results found.  Assessment & Plan:   Paige Foster was seen today for establish care.  Diagnoses and all orders for this visit:  Essential hypertension -     Basic metabolic panel; Future -     losartan (COZAAR) 50 MG tablet; Take 1 tablet (50 mg total) by mouth daily. -     amLODipine (NORVASC) 5 MG tablet; Take 1 tablet (5 mg total) by mouth daily.  Mixed hyperlipidemia -     Lipid panel; Future   I am having Paige Foster maintain her pantoprazole, meloxicam, ipratropium,  atorvastatin, losartan, and amLODipine.  Meds ordered this encounter  Medications  . losartan (COZAAR) 50 MG tablet    Sig: Take 1 tablet (50 mg total) by mouth daily.    Dispense:  90 tablet    Refill:  0    Order Specific Question:   Supervising Provider    Answer:   Lucille Passy [[id number]]  . amLODipine (NORVASC) 5 MG tablet    Sig: Take 1 tablet (5 mg total) by mouth daily.    Dispense:  90 tablet    Refill:  0    Order Specific Question:   Supervising Provider    Answer:   Lucille Passy [[id number]]    Follow-up: Return for HTN and , hyperlipidemia.  Wilfred Lacy, NP

## 2018-05-22 NOTE — Patient Instructions (Addendum)
Please check BP at home  And record. Bring BP reading to next office visit in 54months. Return to office sooner if BP persistently greater than 150/90 x 3days.  Please return to lab fasting for blood draw in 20month (ok to drink water, no food 6hrs prior to blood draw)  URI Instructions: Flonase and Afrin use: apply 1spray of afrin in each nare, wait 77mins, then apply 2sprays of flonase in each nare. Use both nasal spray consecutively x 3days, then flonase only for at least 14days.  Encourage adequate oral hydration.   Avoid decongestants if you have high blood pressure. Use" Delsym" or" Robitussin" cough syrup varietis for cough.  You can use plain "Tylenol" or "Advil" for fever, chills and achyness.   "Common cold" symptoms are usually triggered by a virus.  The antibiotics are usually not necessary. On average, a" viral cold" illness would take 4-7 days to resolve. Please, make an appointment if you are not better or if you're worse.

## 2018-07-26 DIAGNOSIS — C50919 Malignant neoplasm of unspecified site of unspecified female breast: Secondary | ICD-10-CM

## 2018-07-26 HISTORY — DX: Malignant neoplasm of unspecified site of unspecified female breast: C50.919

## 2018-08-25 ENCOUNTER — Telehealth: Payer: Self-pay | Admitting: Nurse Practitioner

## 2018-08-25 DIAGNOSIS — I10 Essential (primary) hypertension: Secondary | ICD-10-CM

## 2018-08-25 MED ORDER — LOSARTAN POTASSIUM 50 MG PO TABS: 50.0000 mg | ORAL_TABLET | Freq: Every day | ORAL | 0 refills | Status: DC

## 2018-08-25 MED ORDER — AMLODIPINE BESYLATE 5 MG PO TABS: 5.0000 mg | ORAL_TABLET | Freq: Every day | ORAL | 0 refills | Status: DC

## 2018-08-25 NOTE — Telephone Encounter (Signed)
Ok to send 90tabs of each medication, no refill

## 2018-08-25 NOTE — Telephone Encounter (Signed)
Rx refilled, call pt left VM to call back to inform about rx being sent

## 2018-08-25 NOTE — Telephone Encounter (Signed)
Patient came into office requesting rx refill for losartan and amlodipine. I verified pharmacy is correct in system. Please let patient know when rx has been sent in.

## 2018-08-25 NOTE — Telephone Encounter (Signed)
Paige Foster, please advise. Pt came in to make [place] for 08/30/2018 and asked for refills on losartan 50 mg and  Amlodipine 5 mg tabs. Pt last refilled and last OV 05/22/18.  Both 90 tabs no refills. Is it okay to refill these?

## 2018-08-29 ENCOUNTER — Encounter: Payer: Self-pay | Admitting: Nurse Practitioner

## 2018-08-29 LAB — LIPID PANEL
Cholesterol: 297 — AB (ref 0–200)
HDL: 76 — AB (ref 35–70)
LDL Cholesterol: 190
TRIGLYCERIDES: 157 (ref 40–160)

## 2018-08-29 LAB — HEPATIC FUNCTION PANEL
ALT: 18 (ref 7–35)
AST: 29 (ref 13–35)

## 2018-08-29 LAB — BASIC METABOLIC PANEL: GLUCOSE: 107

## 2018-08-30 ENCOUNTER — Other Ambulatory Visit: Payer: Self-pay | Admitting: Radiology

## 2018-08-30 ENCOUNTER — Encounter: Payer: Self-pay | Admitting: Nurse Practitioner

## 2018-08-30 ENCOUNTER — Ambulatory Visit (INDEPENDENT_AMBULATORY_CARE_PROVIDER_SITE_OTHER): Payer: BLUE CROSS/BLUE SHIELD | Admitting: Nurse Practitioner

## 2018-08-30 VITALS — BP 160/100 | HR 88 | Temp 99.1°F | Ht 63.5 in | Wt 167.0 lb

## 2018-08-30 DIAGNOSIS — I10 Essential (primary) hypertension: Secondary | ICD-10-CM | POA: Diagnosis not present

## 2018-08-30 DIAGNOSIS — J069 Acute upper respiratory infection, unspecified: Secondary | ICD-10-CM | POA: Diagnosis not present

## 2018-08-30 LAB — HM MAMMOGRAPHY

## 2018-08-30 MED ORDER — AZITHROMYCIN 250 MG PO TABS: 250.0000 mg | ORAL_TABLET | Freq: Every day | ORAL | 0 refills | Status: DC

## 2018-08-30 MED ORDER — GUAIFENESIN ER 600 MG PO TB12: 600.0000 mg | ORAL_TABLET | Freq: Two times a day (BID) | ORAL | 0 refills | Status: DC | PRN

## 2018-08-30 MED ORDER — FLUTICASONE PROPIONATE 50 MCG/ACT NA SUSP: 2.0000 | Freq: Every day | NASAL | 0 refills | Status: DC

## 2018-08-30 MED ORDER — AMLODIPINE BESYLATE 10 MG PO TABS: 10.0000 mg | ORAL_TABLET | Freq: Every day | ORAL | 1 refills | Status: DC

## 2018-08-30 MED ORDER — LOSARTAN POTASSIUM 50 MG PO TABS: 50.0000 mg | ORAL_TABLET | Freq: Every morning | ORAL | 1 refills | Status: DC

## 2018-08-30 NOTE — Patient Instructions (Addendum)
Stable lab results with mild decrease in potassium. Consumption of juice and banana will replace potassium. F/up in 4weeks as discussed. Check BP 3times a week Return to office sooner if BP persistently >150/90 x1week.  Increase amlodipine to 10mg  at bedtime. Maintain losartan at 50mg  in morning.  maitain DASH diet and adequate oral hydration.  Only medications you can take for nasal congestion OTC : mucinex or robitussin or delsym or claritin or flonase or zyrtec or allegra.   DASH Eating Plan DASH stands for "Dietary Approaches to Stop Hypertension." The DASH eating plan is a healthy eating plan that has been shown to reduce high blood pressure (hypertension). It may also reduce your risk for type 2 diabetes, heart disease, and stroke. The DASH eating plan may also help with weight loss. What are tips for following this plan?  General guidelines  Avoid eating more than 2,300 mg (milligrams) of salt (sodium) a day. If you have hypertension, you may need to reduce your sodium intake to 1,500 mg a day.  Limit alcohol intake to no more than 1 drink a day for nonpregnant women and 2 drinks a day for men. One drink equals 12 oz of beer, 5 oz of wine, or 1 oz of hard liquor.  Work with your health care provider to maintain a healthy body weight or to lose weight. Ask what an ideal weight is for you.  Get at least 30 minutes of exercise that causes your heart to beat faster (aerobic exercise) most days of the week. Activities may include walking, swimming, or biking.  Work with your health care provider or diet and nutrition specialist (dietitian) to adjust your eating plan to your individual calorie needs. Reading food labels   Check food labels for the amount of sodium per serving. Choose foods with less than 5 percent of the Daily Value of sodium. Generally, foods with less than 300 mg of sodium per serving fit into this eating plan.  To find whole grains, look for the word "whole" as  the first word in the ingredient list. Shopping  Buy products labeled as "low-sodium" or "no salt added."  Buy fresh foods. Avoid canned foods and premade or frozen meals. Cooking  Avoid adding salt when cooking. Use salt-free seasonings or herbs instead of table salt or sea salt. Check with your health care provider or pharmacist before using salt substitutes.  Do not fry foods. Cook foods using healthy methods such as baking, boiling, grilling, and broiling instead.  Cook with heart-healthy oils, such as olive, canola, soybean, or sunflower oil. Meal planning  Eat a balanced diet that includes: ? 5 or more servings of fruits and vegetables each day. At each meal, try to fill half of your plate with fruits and vegetables. ? Up to 6-8 servings of whole grains each day. ? Less than 6 oz of lean meat, poultry, or fish each day. A 3-oz serving of meat is about the same size as a deck of cards. One egg equals 1 oz. ? 2 servings of low-fat dairy each day. ? A serving of nuts, seeds, or beans 5 times each week. ? Heart-healthy fats. Healthy fats called Omega-3 fatty acids are found in foods such as flaxseeds and coldwater fish, like sardines, salmon, and mackerel.  Limit how much you eat of the following: ? Canned or prepackaged foods. ? Food that is high in trans fat, such as fried foods. ? Food that is high in saturated fat, such as fatty meat. ?  Sweets, desserts, sugary drinks, and other foods with added sugar. ? Full-fat dairy products.  Do not salt foods before eating.  Try to eat at least 2 vegetarian meals each week.  Eat more home-cooked food and less restaurant, buffet, and fast food.  When eating at a restaurant, ask that your food be prepared with less salt or no salt, if possible. What foods are recommended? The items listed may not be a complete list. Talk with your dietitian about what dietary choices are best for you. Grains Whole-grain or whole-wheat bread.  Whole-grain or whole-wheat pasta. Brown rice. [place]. Bulgur. Whole-grain and low-sodium cereals. Pita bread. Low-fat, low-sodium crackers. Whole-wheat flour tortillas. Vegetables Fresh or frozen vegetables (raw, steamed, roasted, or grilled). Low-sodium or reduced-sodium tomato and vegetable juice. Low-sodium or reduced-sodium tomato sauce and tomato paste. Low-sodium or reduced-sodium canned vegetables. Fruits All fresh, dried, or frozen fruit. Canned fruit in natural juice (without added sugar). Meat and other protein foods Skinless chicken or [place]. Ground chicken or [place]. Pork with fat trimmed off. Fish and seafood. Egg whites. Dried beans, peas, or lentils. Unsalted nuts, nut butters, and seeds. Unsalted canned beans. Lean cuts of beef with fat trimmed off. Low-sodium, lean deli meat. Dairy Low-fat (1%) or fat-free (skim) milk. Fat-free, low-fat, or reduced-fat cheeses. Nonfat, low-sodium ricotta or cottage cheese. Low-fat or nonfat yogurt. Low-fat, low-sodium cheese. Fats and oils Soft margarine without trans fats. Vegetable oil. Low-fat, reduced-fat, or light mayonnaise and salad dressings (reduced-sodium). Canola, safflower, olive, soybean, and sunflower oils. Avocado. Seasoning and other foods Herbs. Spices. Seasoning mixes without salt. Unsalted popcorn and pretzels. Fat-free sweets. What foods are not recommended? The items listed may not be a complete list. Talk with your dietitian about what dietary choices are best for you. Grains Baked goods made with fat, such as croissants, muffins, or some breads. Dry pasta or rice meal packs. Vegetables Creamed or fried vegetables. Vegetables in a cheese sauce. Regular canned vegetables (not low-sodium or reduced-sodium). Regular canned tomato sauce and paste (not low-sodium or reduced-sodium). Regular tomato and vegetable juice (not low-sodium or reduced-sodium). Angie Fava. Olives. Fruits Canned fruit in a light or heavy syrup.  Fried fruit. Fruit in cream or butter sauce. Meat and other protein foods Fatty cuts of meat. Ribs. Fried meat. Berniece Salines. Sausage. Bologna and other processed lunch meats. Salami. Fatback. Hotdogs. Bratwurst. Salted nuts and seeds. Canned beans with added salt. Canned or smoked fish. Whole eggs or egg yolks. Chicken or [place] with skin. Dairy Whole or 2% milk, cream, and half-and-half. Whole or full-fat cream cheese. Whole-fat or sweetened yogurt. Full-fat cheese. Nondairy creamers. Whipped toppings. Processed cheese and cheese spreads. Fats and oils Butter. Stick margarine. Lard. Shortening. Ghee. Bacon fat. Tropical oils, such as coconut, palm kernel, or palm oil. Seasoning and other foods Salted popcorn and pretzels. Onion salt, garlic salt, seasoned salt, table salt, and sea salt. Worcestershire sauce. Tartar sauce. Barbecue sauce. Teriyaki sauce. Soy sauce, including reduced-sodium. Steak sauce. Canned and packaged gravies. Fish sauce. Oyster sauce. Cocktail sauce. Horseradish that you find on the shelf. Ketchup. Mustard. Meat flavorings and tenderizers. Bouillon cubes. Hot sauce and Tabasco sauce. Premade or packaged marinades. Premade or packaged taco seasonings. Relishes. Regular salad dressings. Where to find more information:  National Heart, Lung, and [place]: https://wilson-eaton.com/  American Heart Association: www.heart.org Summary  The DASH eating plan is a healthy eating plan that has been shown to reduce high blood pressure (hypertension). It may also reduce your risk for type 2 diabetes,  heart disease, and stroke.  With the DASH eating plan, you should limit salt (sodium) intake to 2,300 mg a day. If you have hypertension, you may need to reduce your sodium intake to 1,500 mg a day.  When on the DASH eating plan, aim to eat more fresh fruits and vegetables, whole grains, lean proteins, low-fat dairy, and heart-healthy fats.  Work with your health care provider or diet and  nutrition specialist (dietitian) to adjust your eating plan to your individual calorie needs. This information is not intended to replace advice given to you by your health care provider. Make sure you discuss any questions you have with your health care provider. Document Released: 07/01/2011 Document Revised: 07/05/2016 Document Reviewed: 07/05/2016 Elsevier Interactive Patient Education  2019 [employer].

## 2018-08-30 NOTE — Progress Notes (Signed)
Subjective:  Patient ID: Paige Foster, female    DOB: [birth year]  Age: 53 y.o. MRN: [id number]  CC: Follow-up ( F/U HTN--  BP reading in between 140- 160/ 100)  URI   This is a new problem. The current episode started in the past 7 days. The problem has been unchanged. Associated symptoms include congestion, coughing, ear pain, headaches, rhinorrhea, sinus pain and swollen glands. She has tried decongestant for the symptoms. The treatment provided no relief.   HTN: uncontrolled. No change in diet No exercise. BP Readings from Last 3 Encounters:  08/30/18 (!) 160/100  05/22/18 (!) 180/108  10/12/17 (!) 150/90   Reviewed past Medical, Social and Family history today.  Outpatient Medications Prior to Visit  Medication Sig Dispense Refill  . amLODipine (NORVASC) 5 MG tablet Take 1 tablet (5 mg total) by mouth daily. 90 tablet 0  . losartan (COZAAR) 50 MG tablet Take 1 tablet (50 mg total) by mouth daily. 90 tablet 0  . atorvastatin (LIPITOR) 40 MG tablet Take 1 tablet (40 mg total) by mouth daily. Start half tablet (20mg ) daily for 2 weeks.  Then increase to the full tablet daily. (Patient not taking: Reported on 05/22/2018) 90 tablet 3  . pantoprazole (PROTONIX) 40 MG tablet Take 1 tablet (40 mg total) by mouth daily. (Patient not taking: Reported on 05/22/2018) 30 tablet 3  . ipratropium (ATROVENT) 0.03 % nasal spray Place 2 sprays into both nostrils 2 (two) times daily. (Patient not taking: Reported on 05/22/2018) 30 mL 0  . meloxicam (MOBIC) 15 MG tablet Take 0.5-1 tablets (7.5-15 mg total) by mouth daily. Do not take with Aleve, Motrin, Aspirin. (Patient not taking: Reported on 05/22/2018) 60 tablet 0   No facility-administered medications prior to visit.     ROS See HPI  Objective:  BP (!) 160/100   Pulse 88   Temp 99.1 F (37.3 C) (Oral)   Ht 5' 3.5" (1.613 m)   Wt 167 lb (75.8 kg)   SpO2 98%   BMI 29.12 kg/m   BP Readings from Last 3 Encounters:  08/30/18 (!)  160/100  05/22/18 (!) 180/108  10/12/17 (!) 150/90    Wt Readings from Last 3 Encounters:  08/30/18 167 lb (75.8 kg)  05/22/18 167 lb 9.6 oz (76 kg)  10/12/17 171 lb 6.4 oz (77.7 kg)    Physical Exam HENT:     Right Ear: Tympanic membrane, ear canal and external ear normal.     Left Ear: Tympanic membrane, ear canal and external ear normal.     Nose: Mucosal edema and rhinorrhea present.     Mouth/Throat:     Pharynx: Posterior oropharyngeal erythema present.  Cardiovascular:     Rate and Rhythm: Normal rate and regular rhythm.  Pulmonary:     Effort: Pulmonary effort is normal.     Breath sounds: Normal breath sounds.  Musculoskeletal:     Right lower leg: No edema.     Left lower leg: No edema.  Neurological:     Mental Status: She is alert and oriented to person, place, and time.     Lab Results  Component Value Date   WBC 6.3 08/30/2018   HGB 12.7 08/30/2018   HCT 37.7 08/30/2018   PLT 301.0 08/30/2018   GLUCOSE 90 08/30/2018   CHOL 297 (A) 08/29/2018   TRIG 157 08/29/2018   HDL 76 (A) 08/29/2018   LDLDIRECT 176.0 05/29/2012   LDLCALC 190 08/29/2018   ALT 18 08/29/2018  AST 29 08/29/2018   NA 136 08/30/2018   K 3.4 (L) 08/30/2018   CL 103 08/30/2018   CREATININE 0.65 08/30/2018   BUN 14 08/30/2018   CO2 26 08/30/2018   TSH 0.77 08/30/2018    Assessment & Plan:   Girl was seen today for follow-up.  Diagnoses and all orders for this visit:  Upper respiratory tract infection, unspecified type -     fluticasone (FLONASE) 50 MCG/ACT nasal spray; Place 2 sprays into both nostrils daily. -     guaiFENesin (MUCINEX) 600 MG 12 hr tablet; Take 1 tablet (600 mg total) by mouth 2 (two) times daily as needed for cough or to loosen phlegm. -     azithromycin (ZITHROMAX Z-PAK) 250 MG tablet; Take 1 tablet (250 mg total) by mouth daily. Take 2tabs on first day, then 1tab once a day till complete  Essential hypertension -     Basic metabolic panel -     TSH -      CBC -     amLODipine (NORVASC) 10 MG tablet; Take 1 tablet (10 mg total) by mouth at bedtime. -     losartan (COZAAR) 50 MG tablet; Take 1 tablet (50 mg total) by mouth every morning.   I have discontinued Paige Michael. Foster's meloxicam and ipratropium. I have also changed her amLODipine and losartan. Additionally, I am having her start on fluticasone, guaiFENesin, and azithromycin. Lastly, I am having her maintain her pantoprazole and atorvastatin.  Meds ordered this encounter  Medications  . amLODipine (NORVASC) 10 MG tablet    Sig: Take 1 tablet (10 mg total) by mouth at bedtime.    Dispense:  90 tablet    Refill:  1    Order Specific Question:   Supervising Provider    Answer:   Lucille Passy [[id number]]  . losartan (COZAAR) 50 MG tablet    Sig: Take 1 tablet (50 mg total) by mouth every morning.    Dispense:  90 tablet    Refill:  1    Order Specific Question:   Supervising Provider    Answer:   Lucille Passy [[id number]]  . fluticasone (FLONASE) 50 MCG/ACT nasal spray    Sig: Place 2 sprays into both nostrils daily.    Dispense:  16 g    Refill:  0    Order Specific Question:   Supervising Provider    Answer:   Lucille Passy [[id number]]  . guaiFENesin (MUCINEX) 600 MG 12 hr tablet    Sig: Take 1 tablet (600 mg total) by mouth 2 (two) times daily as needed for cough or to loosen phlegm.    Dispense:  14 tablet    Refill:  0    Order Specific Question:   Supervising Provider    Answer:   Lucille Passy [[id number]]  . azithromycin (ZITHROMAX Z-PAK) 250 MG tablet    Sig: Take 1 tablet (250 mg total) by mouth daily. Take 2tabs on first day, then 1tab once a day till complete    Dispense:  6 tablet    Refill:  0    Order Specific Question:   Supervising Provider    Answer:   Lucille Passy [[id number]]    Problem List Items Addressed This Visit      Cardiovascular and Mediastinum   HTN (hypertension)   Relevant Medications   amLODipine (NORVASC) 10 MG tablet   losartan (COZAAR) 50 MG tablet    Other Relevant Orders  Basic metabolic panel (Completed)   TSH (Completed)   CBC (Completed)    Other Visit Diagnoses    Upper respiratory tract infection, unspecified type    -  Primary   Relevant Medications   fluticasone (FLONASE) 50 MCG/ACT nasal spray   guaiFENesin (MUCINEX) 600 MG 12 hr tablet   azithromycin (ZITHROMAX Z-PAK) 250 MG tablet       Follow-up: Return in about 4 weeks (around 09/27/2018) for CPE (fasting).  Wilfred Lacy, NP

## 2018-08-31 ENCOUNTER — Encounter: Payer: Self-pay | Admitting: Nurse Practitioner

## 2018-08-31 LAB — BASIC METABOLIC PANEL
BUN: 14 mg/dL (ref 6–23)
CALCIUM: 9.5 mg/dL (ref 8.4–10.5)
CO2: 26 mEq/L (ref 19–32)
Chloride: 103 mEq/L (ref 96–112)
Creatinine, Ser: 0.65 mg/dL (ref 0.40–1.20)
GFR: 114.92 mL/min (ref >60.00)
Glucose, Bld: 90 mg/dL (ref 70–99)
Potassium: 3.4 mEq/L — ABNORMAL LOW (ref 3.5–5.1)
Sodium: 136 mEq/L (ref 135–145)

## 2018-08-31 LAB — CBC
HCT: 37.7 % (ref 36.0–46.0)
Hemoglobin: 12.7 g/dL (ref 12.0–15.0)
MCHC: 33.8 g/dL (ref 30.0–36.0)
MCV: 91.6 fl (ref 78.0–100.0)
Platelets: 301 10*3/uL (ref 150.0–400.0)
RBC: 4.12 Mil/uL (ref 3.87–5.11)
RDW: 12.8 % (ref 11.5–15.5)
WBC: 6.3 10*3/uL (ref 4.0–10.5)

## 2018-08-31 LAB — TSH: TSH: 0.77 u[IU]/mL (ref 0.35–4.50)

## 2018-09-06 ENCOUNTER — Telehealth: Payer: Self-pay | Admitting: *Deleted

## 2018-09-06 DIAGNOSIS — C50412 Malignant neoplasm of upper-outer quadrant of left female breast: Secondary | ICD-10-CM

## 2018-09-06 DIAGNOSIS — Z170 Estrogen receptor positive status [ER+]: Principal | ICD-10-CM | POA: Insufficient documentation

## 2018-09-06 NOTE — Progress Notes (Signed)
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 At that time she underwent a left mastectomy for stage 0 (noninvasive) breast cancer, with transverse rectus abdominis (TRAM) flap construction under Dr. Towanda Malkin. She also underwent a right breast reduction. She took tamoxifen for three years.  More recently she underwent bilateral diagnostic mammography with tomography and left breast ultrasonography at [facility] on 01/03/2018 showing: Breast Density Category B. There is an oval fat containing lesion in the left breast upper outer quadrant posterior depth. No other significant masses, calcifications, or other findings are seen in either breast. Sonographically, there is a 1.5 cm lesion in the left breast upper outer quadrant posterior depth. This lesion is of mixed echogenicity. This correlates as palpated and with mammography findings. Follow up was recommended.  Close follow-up was suggested.  She then presented with a non-tender mass in the left reconstructed breast on 08/30/2018. On physical exam, there is a hard palpable lump measuring 2.0 cm in the upper outer left reconstructed breast 10 cm from the expected location of a nipple. Sonography over this area demonstrates a 1.8 cm x 1.7 cm x 1.4 cm mass in the left breast at 2 o'clock posterior depth 10 cm from the nipple. This mass is of mixed echogenicity. This  abnormality is increased in size and correlates as palpated and with prior mammography findings. Color flow imaging demonstrates that there is vascularity present. Elastography imaging assessment is intermediate. No significant abnormalities were seen sonographically in the left axilla.    Accordingly on 08/30/2018 she proceeded to biopsy of the left breast mass in question. The pathology from this procedure showed ([id number]): invasive ductal carcinoma, grade III. Prognostic indicators significant for: estrogen receptor, 100% positive with strong staining intensity and progesterone receptor, 0% negative. Proliferation marker Ki67 at 15%. HER2 positive (3+) by immunohistochemistry.  The patient's subsequent history is as detailed below.   INTERVAL HISTORY: Paige Foster was evaluated in the multidisciplinary breast cancer clinic on 09/07/2018.  Her case was also presented at the multidisciplinary breast cancer conference on 09/06/2018. At that time a preliminary plan was proposed:  chemotherapy with anti-HER-2 treatment, consideration of genetics testing, definitive surgery, adjuvant radiation and antiestrogens  REVIEW OF SYSTEMS: Paige Foster had a cold last week, with some residual symptoms at today's visit; she does not believe that she had a fever. The patient denies unusual headaches, visual changes, nausea, vomiting, stiff neck, dizziness, or gait imbalance. There has been no pleurisy, no chest pain or pressure, and no change in bowel or bladder habits. The patient denies fever, rash, bleeding, unexplained fatigue or unexplained weight loss. A detailed review of systems was otherwise entirely negative.   PAST MEDICAL HISTORY: Past Medical History:  Diagnosis Date  . Breast CA ([place])    (07/26/2002)--s/p surgery , had Tamoxifen up until 2007 per pt (no XRT-Chemo)  . Breast cancer ([place]) 2020  . Colon polyps   . Hypertension    Caesarian    PAST SURGICAL HISTORY: Past  Surgical History:  Procedure  Laterality Date  . ABDOMINOPLASTY     at time of mastectomy per pt  . BREAST REDUCTION SURGERY Right   . BREAST SURGERY    . COLONOSCOPY    . MASTECTOMY Left    Mastectomy (2004) (left) and  a flap procedure   . TUBAL LIGATION       FAMILY HISTORY: Family History  Problem Relation Age of Onset  . Diabetes Mother   . Hypertension Mother   . Kidney disease Father   . Colon cancer Neg Hx   . Colon polyps Neg Hx   . Gallbladder disease Neg Hx   . Heart disease Neg Hx   . Esophageal cancer Neg Hx    Paige Foster's father died from unknown causes in his early 68's. Patients' mother died from diabetes complications at age 80. The patient has 1 sister. Patient denies anyone in her family having breast, ovarian, prostate, or pancreatic cancer.    GYNECOLOGIC HISTORY:  No LMP recorded. (Menstrual status: Other). 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 Her husband, Paige Foster, works at [employer]. Cyanne has two children, Paige Foster and Paige Foster. Paige Foster lives with her, is 59, and it attending [employer] for a computer based degree. Paige Foster lives with her, is 8, and recently graduated from [employer] with a degree in Careers information officer. Paige Foster has no grandchildren. She attends the [employer].    ADVANCED DIRECTIVES: Her husband, Paige Foster, is automatically her healthcare power of attorney     HEALTH MAINTENANCE: Social History   Tobacco Use  . Smoking status: Never Smoker  . Smokeless tobacco: Never Used  Substance Use Topics  . Alcohol use: Not Currently    Alcohol/week: 0.0 standard drinks    Comment: Occassionally  . Drug use: No    Colonoscopy: yes  PAP:   Bone density: yes, 2017; -1.6, osteopenic   No Known Allergies  Current Outpatient Medications  Medication Sig Dispense Refill  . amLODipine  (NORVASC) 10 MG tablet Take 1 tablet (10 mg total) by mouth at bedtime. 90 tablet 1  . losartan (COZAAR) 50 MG tablet Take 1 tablet (50 mg total) by mouth every morning. 90 tablet 1   No current facility-administered medications for this visit.      OBJECTIVE: Middle-aged African-American woman who appears stated age  72:   09/07/18 1537  BP: 112/72  Pulse: (!) 58  Resp: 18  Temp: 97.9 F (36.6 C)  SpO2: 100%     Body mass index is 29.01 kg/m.   Wt Readings from Last 3 Encounters:  09/07/18 166 lb 6.4 oz (75.5 kg)  08/30/18 167 lb (75.8 kg)  05/22/18 167 lb 9.6 oz (76 kg)      ECOG FS:0 - Asymptomatic  Ocular: Sclerae unicteric, pupils round and equal, mild exophthalmos Ear-nose-throat: Oropharynx clear and moist Lymphatic: No cervical or supraclavicular adenopathy Lungs no rales or rhonchi Heart regular rate and rhythm Abd soft, nontender, positive bowel sounds MSK no focal spinal tenderness, no joint edema Neuro: non-focal, well-oriented, appropriate affect Breasts: Right breast is status post reduction mammoplasty.  Is otherwise unremarkable.  The left breast is status post mastectomy with TRAM reconstruction.  In the upper outer quadrant there is a palpable ballotable mass measuring slightly more than a centimeter.  There  is no skin involvement.  The left axilla is benign   LAB RESULTS:  CMP     Component Value Date/Time   NA 140 09/07/2018 1455   NA 140 10/12/2017 0950   K 4.1 09/07/2018 1455   CL 104 09/07/2018 1455   CO2 27 09/07/2018 1455   GLUCOSE 91 09/07/2018 1455   BUN 13 09/07/2018 1455   BUN 10 10/12/2017 0950   CREATININE 0.78 09/07/2018 1455   CALCIUM 9.7 09/07/2018 1455   PROT 7.6 09/07/2018 1455   PROT 7.5 10/12/2017 0950   ALBUMIN 4.1 09/07/2018 1455   ALBUMIN 4.3 10/12/2017 0950   AST 14 (L) 09/07/2018 1455   ALT 13 09/07/2018 1455   ALKPHOS 88 09/07/2018 1455   BILITOT 0.2 (L) 09/07/2018 1455   GFRNONAA >60 09/07/2018 1455   GFRAA  >60 09/07/2018 1455    No results found for: TOTALPROTELP, ALBUMINELP, A1GS, A2GS, BETS, BETA2SER, GAMS, MSPIKE, SPEI  No results found for: KPAFRELGTCHN, LAMBDASER, KAPLAMBRATIO  Lab Results  Component Value Date   WBC 7.7 09/07/2018   NEUTROABS 3.6 09/07/2018   HGB 12.3 09/07/2018   HCT 36.9 09/07/2018   MCV 92.7 09/07/2018   PLT 309 09/07/2018    _0 @  Lab Results  Component Value Date   LABCA2 <4 08/30/2007    No components found for: [id number]  No results for input(s): INR in the last 168 hours.  Lab Results  Component Value Date   LABCA2 <4 08/30/2007    No results found for: HUD149  No results found for: FWY637  No results found for: CHY850  No results found for: CA2729  No components found for: HGQUANT  No results found for: CEA1 / No results found for: CEA1   No results found for: AFPTUMOR  No results found for: [place]  No results found for: PSA1  Appointment on 09/07/2018  Component Date Value Ref Range Status  . Sodium 09/07/2018 140  135 - 145 mmol/L Final  . Potassium 09/07/2018 4.1  3.5 - 5.1 mmol/L Final  . Chloride 09/07/2018 104  98 - 111 mmol/L Final  . CO2 09/07/2018 27  22 - 32 mmol/L Final  . Glucose, Bld 09/07/2018 91  70 - 99 mg/dL Final  . BUN 09/07/2018 13  6 - 20 mg/dL Final  . Creatinine 09/07/2018 0.78  0.44 - 1.00 mg/dL Final  . Calcium 09/07/2018 9.7  8.9 - 10.3 mg/dL Final  . Total Protein 09/07/2018 7.6  6.5 - 8.1 g/dL Final  . Albumin 09/07/2018 4.1  3.5 - 5.0 g/dL Final  . AST 09/07/2018 14* 15 - 41 U/L Final  . ALT 09/07/2018 13  0 - 44 U/L Final  . Alkaline Phosphatase 09/07/2018 88  38 - 126 U/L Final  . Total Bilirubin 09/07/2018 0.2* 0.3 - 1.2 mg/dL Final  . GFR, Est Non Af Am 09/07/2018 >60  >60 mL/min Final  . GFR, Est AFR Am 09/07/2018 >60  >60 mL/min Final  . Anion gap 09/07/2018 9  5 - 15 Final   Performed at [facility], [place] [address], [place], [place] [address]   . WBC Count 09/07/2018 7.7  4.0 - 10.5 K/uL Final  . RBC 09/07/2018 3.98  3.87 - 5.11 MIL/uL Final  . Hemoglobin 09/07/2018 12.3  12.0 - 15.0 g/dL Final  . HCT 09/07/2018 36.9  36.0 - 46.0 % Final  . MCV 09/07/2018 92.7  80.0 - 100.0 fL Final  . MCH 09/07/2018 30.9  26.0 -  34.0 pg Final  . MCHC 09/07/2018 33.3  30.0 - 36.0 g/dL Final  . RDW 09/07/2018 12.2  11.5 - 15.5 % Final  . Platelet Count 09/07/2018 309  150 - 400 K/uL Final  . nRBC 09/07/2018 0.0  0.0 - 0.2 % Final  . Neutrophils Relative % 09/07/2018 47  % Final  . Neutro Abs 09/07/2018 3.6  1.7 - 7.7 K/uL Final  . Lymphocytes Relative 09/07/2018 43  % Final  . Lymphs Abs 09/07/2018 3.3  0.7 - 4.0 K/uL Final  . Monocytes Relative 09/07/2018 9  % Final  . Monocytes Absolute 09/07/2018 0.7  0.1 - 1.0 K/uL Final  . Eosinophils Relative 09/07/2018 1  % Final  . Eosinophils Absolute 09/07/2018 0.1  0.0 - 0.5 K/uL Final  . Basophils Relative 09/07/2018 0  % Final  . Basophils Absolute 09/07/2018 0.0  0.0 - 0.1 K/uL Final  . Immature Granulocytes 09/07/2018 0  % Final  . Abs Immature Granulocytes 09/07/2018 0.01  0.00 - 0.07 K/uL Final   Performed at [facility], [place] [address], [place], [place] [address]    (this displays the last labs from the last 3 days)  No results found for: TOTALPROTELP, ALBUMINELP, A1GS, A2GS, BETS, BETA2SER, GAMS, MSPIKE, SPEI (this displays SPEP labs)  No results found for: KPAFRELGTCHN, LAMBDASER, KAPLAMBRATIO (kappa/lambda light chains)  No results found for: HGBA, HGBA2QUANT, HGBFQUANT, HGBSQUAN (Hemoglobinopathy evaluation)   Lab Results  Component Value Date   LDH 169 08/30/2007    Lab Results  Component Value Date   IRON 78 01/01/2009   TIBC 424 08/30/2007   IRONPCTSAT 17.9 (L) 01/01/2009   (Iron and TIBC)  Lab Results  Component Value Date   FERRITIN 19 08/30/2007    Urinalysis    Component Value Date/Time   LABSPEC 1.015 04/09/2008 1548   PHURINE  7.0 04/09/2008 1548   HGBUR large 04/09/2008 1548   BILIRUBINUR negative 04/09/2008 1548   UROBILINOGEN 0.2 04/09/2008 1548   NITRITE negative 04/09/2008 1548     STUDIES:  Pathology and imaging results discussed with the patient  ELIGIBLE FOR AVAILABLE RESEARCH PROTOCOL:    ASSESSMENT: 53 y.o. [place], [place] woman  (1) history of left-sided ductal carcinoma in situ 2004  (a) s/pleft mastectomy with TRAM reconstruction  (b) status post tamoxifen x3 years  (2) left breast upper outer quadrant biopsy 08/30/2018 shows a clinical T1c N0 invasive ductal carcinoma, grade 3, estrogen receptor positive, progesterone receptor negative, with no HER-2 amplification, and and MIB-1 of 15%.  (3) neoadjuvant chemotherapy will consist of cyclophosphamide, docetaxel, trastuzumab and Pertuzumab starting 09/26/2018, repeated every 21 days x 6.  (4) definitive surgery to follow  (5) adjuvant radiation therapy as appropriate  (6) antiestrogens to follow at the completion of local treatment   PLAN: I spent approximately 60 minutes face to face with Paige Foster with more than 50% of that time spent in counseling and coordination of care. Specifically we reviewed the biology of the patient's diagnosis and the specifics of her situation.  We first reviewed the fact that cancer is not one disease but more than 100 different diseases and that it is important to keep them separate-- otherwise when friends and relatives discuss their own cancer experiences with Paige Foster confusion can result. Similarly we explained that if breast cancer spreads to the bone or liver, the patient would not have bone cancer or liver cancer, but breast cancer in the bone and breast cancer in the liver: one cancer in  three places-- not 3 different cancers which otherwise would have to be treated in 3 different ways.  We discussed the difference between local and systemic therapy. In terms of loco-regional treatment, lumpectomy plus radiation  is equivalent to mastectomy as far as survival is concerned. For this reason, and because the cosmetic results are generally superior, we recommend breast conserving surgery if cosmetically feasible.   We also noted that in terms of sequencing of treatments, whether systemic therapy or surgery is done first does not affect the ultimate outcome.  In her case we are considering neoadjuvant chemotherapy which may make the definitive surgery easier.  We then discussed the rationale for systemic therapy. There is some risk that this cancer may have already spread to other parts of her body. Scans cannot answer the question the patient really would like to know, which is whether she has microscopic disease elsewhere in her body.  But in case of recurrence they are indicated and at least they will reassure her that she does not have stage IV disease at this point  Next we went over the options for systemic therapy which are anti-estrogens, anti-HER-2 immunotherapy, and chemotherapy. Paige Foster meets criteria for all 3.  Specifically we are going to go with cyclophosphamide, docetaxel, trastuzumab and Pertuzumab.  We briefly discussed the possible toxicities side effects and complications of these agents but she will come to "chemotherapy school" for a more in-depth review.  She will need a port, an echocardiogram, a breast MRI, and a CT scan of the chest.  If she qualifies for the upbeat study we will discuss that also at that time.  Tentatively she would like to be treated on Friday's so that she only misses 1 Friday every 3 weeks.  If there is no flexibility in her work then she will need to go on disability for 6 months.  She will let us know next week  Paige Foster has a good understanding of the overall plan. She agrees with it. She knows the goal of treatment in her case is cure. She will call with any problems that may develop before her next visit here.   Magrinat, Virgie Dad, MD  09/07/18 4:13 PM Medical  Oncology and Hematology [facility] [address] [place], [place] [address] Tel. [phone]    Fax. [phone]   I, Jacqualyn Posey am acting as a Education administrator for Chauncey Cruel, MD.   I, Lurline Del MD, have reviewed the above documentation for accuracy and completeness, and I agree with the above.

## 2018-09-06 NOTE — Telephone Encounter (Signed)
Received urgent referral from Dr. Donne Hazel. Gave Dr. Cristal Generous nurse appt date and time of 2/13 at 3:30.

## 2018-09-07 ENCOUNTER — Other Ambulatory Visit: Payer: Self-pay | Admitting: General Surgery

## 2018-09-07 ENCOUNTER — Encounter: Payer: Self-pay | Admitting: Oncology

## 2018-09-07 ENCOUNTER — Inpatient Hospital Stay (HOSPITAL_BASED_OUTPATIENT_CLINIC_OR_DEPARTMENT_OTHER): Payer: BLUE CROSS/BLUE SHIELD | Admitting: Oncology

## 2018-09-07 ENCOUNTER — Inpatient Hospital Stay: Payer: BLUE CROSS/BLUE SHIELD | Attending: Internal Medicine

## 2018-09-07 ENCOUNTER — Telehealth: Payer: Self-pay | Admitting: Oncology

## 2018-09-07 VITALS — BP 112/72 | HR 58 | Temp 97.9°F | Resp 18 | Ht 63.5 in | Wt 166.4 lb

## 2018-09-07 DIAGNOSIS — Z170 Estrogen receptor positive status [ER+]: Secondary | ICD-10-CM | POA: Insufficient documentation

## 2018-09-07 DIAGNOSIS — C50412 Malignant neoplasm of upper-outer quadrant of left female breast: Secondary | ICD-10-CM | POA: Insufficient documentation

## 2018-09-07 DIAGNOSIS — Z9012 Acquired absence of left breast and nipple: Secondary | ICD-10-CM

## 2018-09-07 DIAGNOSIS — Z853 Personal history of malignant neoplasm of breast: Secondary | ICD-10-CM | POA: Diagnosis not present

## 2018-09-07 DIAGNOSIS — Z79899 Other long term (current) drug therapy: Secondary | ICD-10-CM

## 2018-09-07 DIAGNOSIS — C50912 Malignant neoplasm of unspecified site of left female breast: Secondary | ICD-10-CM | POA: Insufficient documentation

## 2018-09-07 LAB — CMP (CANCER CENTER ONLY)
ALT: 13 U/L (ref 0–44)
AST: 14 U/L — ABNORMAL LOW (ref 15–41)
Albumin: 4.1 g/dL (ref 3.5–5.0)
Alkaline Phosphatase: 88 U/L (ref 38–126)
Anion gap: 9 (ref 5–15)
BUN: 13 mg/dL (ref 6–20)
CO2: 27 mmol/L (ref 22–32)
CREATININE: 0.78 mg/dL (ref 0.44–1.00)
Calcium: 9.7 mg/dL (ref 8.9–10.3)
Chloride: 104 mmol/L (ref 98–111)
GFR, Est AFR Am: >60 mL/min (ref >60)
GFR, Estimated: >60 mL/min (ref >60)
Glucose, Bld: 91 mg/dL (ref 70–99)
Potassium: 4.1 mmol/L (ref 3.5–5.1)
Sodium: 140 mmol/L (ref 135–145)
Total Bilirubin: 0.2 mg/dL — ABNORMAL LOW (ref 0.3–1.2)
Total Protein: 7.6 g/dL (ref 6.5–8.1)

## 2018-09-07 LAB — CBC WITH DIFFERENTIAL (CANCER CENTER ONLY)
Abs Immature Granulocytes: 0.01 10*3/uL (ref 0.00–0.07)
Basophils Absolute: 0 10*3/uL (ref 0.0–0.1)
Basophils Relative: 0 %
Eosinophils Absolute: 0.1 10*3/uL (ref 0.0–0.5)
Eosinophils Relative: 1 %
HCT: 36.9 % (ref 36.0–46.0)
Hemoglobin: 12.3 g/dL (ref 12.0–15.0)
Immature Granulocytes: 0 %
Lymphocytes Relative: 43 %
Lymphs Abs: 3.3 10*3/uL (ref 0.7–4.0)
MCH: 30.9 pg (ref 26.0–34.0)
MCHC: 33.3 g/dL (ref 30.0–36.0)
MCV: 92.7 fL (ref 80.0–100.0)
MONO ABS: 0.7 10*3/uL (ref 0.1–1.0)
Monocytes Relative: 9 %
NEUTROS ABS: 3.6 10*3/uL (ref 1.7–7.7)
Neutrophils Relative %: 47 %
Platelet Count: 309 10*3/uL (ref 150–400)
RBC: 3.98 MIL/uL (ref 3.87–5.11)
RDW: 12.2 % (ref 11.5–15.5)
WBC Count: 7.7 10*3/uL (ref 4.0–10.5)
nRBC: 0 % (ref 0.0–0.2)

## 2018-09-07 NOTE — Telephone Encounter (Signed)
Gave avs and calendar ° °

## 2018-09-07 NOTE — Progress Notes (Signed)
START ON PATHWAY REGIMEN - Breast     A cycle is every 21 days:     Pertuzumab      Pertuzumab      Trastuzumab-xxxx      Trastuzumab-xxxx      Carboplatin      Docetaxel   **Always confirm dose/schedule in your pharmacy ordering system**  Patient Characteristics: Preoperative or Nonsurgical Candidate (Clinical Staging), Neoadjuvant Therapy followed by Surgery, Invasive Disease, Chemotherapy, HER2 Positive, ER Positive Therapeutic Status: Preoperative or Nonsurgical Candidate (Clinical Staging) AJCC M Category: cM0 AJCC Grade: G3 Breast Surgical Plan: Neoadjuvant Therapy followed by Surgery ER Status: Positive (+) AJCC 8 Stage Grouping: IA HER2 Status: Positive (+) AJCC T Category: cT1c AJCC N Category: cN0 PR Status: Negative (-) Intent of Therapy: Curative Intent, Discussed with Patient

## 2018-09-11 ENCOUNTER — Telehealth: Payer: Self-pay | Admitting: Oncology

## 2018-09-11 NOTE — Telephone Encounter (Signed)
Patient walk-in to schedule appts, ok with GM. Gave calendar.

## 2018-09-14 ENCOUNTER — Ambulatory Visit (HOSPITAL_COMMUNITY)
Admission: RE | Admit: 2018-09-14 | Discharge: 2018-09-14 | Disposition: A | Discharge status: Home / Self Care | Payer: BLUE CROSS/BLUE SHIELD | Source: Ambulatory Visit | Attending: Oncology | Admitting: Oncology

## 2018-09-14 ENCOUNTER — Other Ambulatory Visit: Payer: Self-pay | Admitting: Oncology

## 2018-09-14 ENCOUNTER — Encounter (HOSPITAL_COMMUNITY): Payer: Self-pay

## 2018-09-14 DIAGNOSIS — C50412 Malignant neoplasm of upper-outer quadrant of left female breast: Secondary | ICD-10-CM | POA: Insufficient documentation

## 2018-09-14 DIAGNOSIS — C50912 Malignant neoplasm of unspecified site of left female breast: Secondary | ICD-10-CM | POA: Diagnosis present

## 2018-09-14 DIAGNOSIS — Z170 Estrogen receptor positive status [ER+]: Secondary | ICD-10-CM | POA: Diagnosis present

## 2018-09-14 MED ORDER — SODIUM CHLORIDE (PF) 0.9 % IJ SOLN
INTRAMUSCULAR | Status: AC
  Filled 2018-09-14: qty 50

## 2018-09-14 MED ORDER — IOHEXOL 300 MG/ML  SOLN
75.0000 mL | Freq: Once | INTRAMUSCULAR | Status: AC | PRN
  Administered 2018-09-14: 75 mL via INTRAVENOUS

## 2018-09-15 NOTE — Progress Notes (Addendum)
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 At that time she underwent a left mastectomy for stage 0 (noninvasive) breast cancer, with transverse rectus abdominis (TRAM) flap construction under Dr. Towanda Malkin. She also underwent a right breast reduction. She took tamoxifen for three years.  More recently she underwent bilateral diagnostic mammography with tomography and left breast ultrasonography at [facility] on 01/03/2018 showing: Breast Density Category B. There is an oval fat containing lesion in the left breast upper outer quadrant posterior depth. No other significant masses, calcifications, or other findings are seen in either breast. Sonographically, there is a 1.5 cm lesion in the left breast upper outer quadrant posterior depth. This lesion is of mixed echogenicity. This correlates as palpated and with mammography findings. Follow up was recommended.  Close follow-up was suggested.  She then presented with a non-tender mass in the left reconstructed breast on 08/30/2018. On physical exam, there is a hard palpable lump measuring 2.0 cm in the upper outer left reconstructed breast 10 cm from the expected location of a nipple. Sonography over this area demonstrates a 1.8 cm x 1.7 cm x 1.4 cm mass in the left breast at 2 o'clock posterior depth 10 cm from the nipple. This  mass is of mixed echogenicity. This abnormality is increased in size and correlates as palpated and with prior mammography findings. Color flow imaging demonstrates that there is vascularity present. Elastography imaging assessment is intermediate. No significant abnormalities were seen sonographically in the left axilla.    Accordingly on 08/30/2018 she proceeded to biopsy of the left breast mass in question. The pathology from this procedure showed ([id number]): invasive ductal carcinoma, grade III. Prognostic indicators significant for: estrogen receptor, 100% positive with strong staining intensity and progesterone receptor, 0% negative. Proliferation marker Ki67 at 15%. HER2 positive (3+) by immunohistochemistry.  The patient's subsequent history is as detailed below.   INTERVAL HISTORY: Paige Foster returns today for follow-up and treatment of her estrogen and HER-2 positive breast cancer.   She will begin neoadjuvant chemotherapy will consist of carboplatin, docetaxel, trastuzumab and Pertuzumab starting 09/26/2018, repeated every 21 days x 6.   Since her last visit here, she underwent a chest CT with contrast on 09/14/2018 showing No evidence for metastatic disease in the thorax. 1.8 cm soft tissue lesion lateral left breast compatible with reported history of newly diagnosed left breast cancer.  She also underwent a bilateral breast MRI on 09/17/2018. Results were not available at the time of the appointment.    REVIEW OF SYSTEMS: Paige Foster wants to see how chemotherapy affects her before she decides whether she will go on disability or not. She says her family is handling her diagnosis generally well. The patient denies unusual headaches, visual changes, nausea, vomiting, or dizziness. There has been no unusual cough, phlegm production, or pleurisy. This been no change in bowel or bladder habits. The patient denies unexplained fatigue or unexplained weight loss, bleeding, rash, or  fever. A detailed  review of systems was otherwise noncontributory.    PAST MEDICAL HISTORY: Past Medical History:  Diagnosis Date  . Breast CA ([place])    (07/26/2002)--s/p surgery , had Tamoxifen up until 2007 per pt (no XRT-Chemo)  . Breast cancer ([place]) 2020  . Colon polyps   . Hypertension    Caesarian    PAST SURGICAL HISTORY: Past Surgical History:  Procedure Laterality Date  . ABDOMINOPLASTY     at time of mastectomy per pt  . BREAST REDUCTION SURGERY Right   . BREAST SURGERY    . COLONOSCOPY    . MASTECTOMY Left    Mastectomy (2004) (left) and  a flap procedure   . TUBAL LIGATION       FAMILY HISTORY: Family History  Problem Relation Age of Onset  . Diabetes Mother   . Hypertension Mother   . Kidney disease Foster   . Colon cancer Neg Hx   . Colon polyps Neg Hx   . Gallbladder disease Neg Hx   . Heart disease Neg Hx   . Esophageal cancer Neg Hx    Paige Foster died from unknown causes in his early 66's. Patients' mother died from diabetes complications at age 5. The patient has 1 sister. Patient denies anyone in her family having breast, ovarian, prostate, or pancreatic cancer.    GYNECOLOGIC HISTORY:  No LMP recorded. (Menstrual status: Other). 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 Her husband, Paige Foster, works at [employer]. Nare has two children, Paige Foster and Paige Foster. Paige Foster lives with her, is 39, and it attending [employer] for a computer based degree. Paige Foster lives with her, is 25, and recently graduated from [employer] with a degree in Careers information officer. Paige Foster has no grandchildren. She attends the [employer].   ADVANCED DIRECTIVES: Her husband, Paige Foster, is automatically her healthcare power of attorney     HEALTH MAINTENANCE: Social History   Tobacco Use  . Smoking status: Never  Smoker  . Smokeless tobacco: Never Used  Substance Use Topics  . Alcohol use: Not Currently    Alcohol/week: 0.0 standard drinks    Comment: Occassionally  . Drug use: No    Colonoscopy: yes  PAP:   Bone density: yes, 2017; -1.6, osteopenic   No Known Allergies  Current Outpatient Medications  Medication Sig Dispense Refill  . amLODipine (NORVASC) 10 MG tablet Take 1 tablet (10 mg total) by mouth at bedtime. 90 tablet 1  . losartan (COZAAR) 50 MG tablet Take 1 tablet (50 mg total) by mouth every morning. 90 tablet 1   No current facility-administered medications for this visit.      OBJECTIVE: Middle-aged African-American woman in no acute distress  Vitals:   09/18/18 1010  BP: (!) 165/68  Pulse: 61  Resp: 18  Temp: 98.5 F (36.9 C)  SpO2: 100%     Body mass index is 29.66 kg/m.   Wt Readings from Last 3 Encounters:  09/18/18 164 lb 12.8 oz (74.8 kg)  09/07/18 166 lb 6.4 oz (75.5 kg)  08/30/18 167 lb (75.8 kg)      ECOG FS:0 - Asymptomatic  Sclerae unicteric, EOMs intact No cervical or supraclavicular adenopathy Lungs no rales or rhonchi Heart regular rate and rhythm Abd soft, nontender, positive bowel sounds MSK no  focal spinal tenderness, no upper extremity lymphedema Neuro: nonfocal, well oriented, appropriate affect Breasts: The right breast is status post reduction mammoplasty.  The left breast is status post mastectomy with TRAM reconstruction.  The mass is in the upper outer quadrant, it is easily palpable.  There is no skin involvement.  Both axillae are benign.  LAB RESULTS:  CMP     Component Value Date/Time   NA 140 09/07/2018 1455   NA 140 10/12/2017 0950   K 4.1 09/07/2018 1455   CL 104 09/07/2018 1455   CO2 27 09/07/2018 1455   GLUCOSE 91 09/07/2018 1455   BUN 13 09/07/2018 1455   BUN 10 10/12/2017 0950   CREATININE 0.78 09/07/2018 1455   CALCIUM 9.7 09/07/2018 1455   PROT 7.6 09/07/2018 1455   PROT 7.5 10/12/2017 0950   ALBUMIN 4.1  09/07/2018 1455   ALBUMIN 4.3 10/12/2017 0950   AST 14 (L) 09/07/2018 1455   ALT 13 09/07/2018 1455   ALKPHOS 88 09/07/2018 1455   BILITOT 0.2 (L) 09/07/2018 1455   GFRNONAA >60 09/07/2018 1455   GFRAA >60 09/07/2018 1455    No results found for: TOTALPROTELP, ALBUMINELP, A1GS, A2GS, BETS, BETA2SER, GAMS, MSPIKE, SPEI  No results found for: KPAFRELGTCHN, LAMBDASER, KAPLAMBRATIO  Lab Results  Component Value Date   WBC 7.7 09/07/2018   NEUTROABS 3.6 09/07/2018   HGB 12.3 09/07/2018   HCT 36.9 09/07/2018   MCV 92.7 09/07/2018   PLT 309 09/07/2018    Lab Results  Component Value Date   LABCA2 <4 08/30/2007    No components found for: [id number]  No results for input(s): INR in the last 168 hours.  Lab Results  Component Value Date   LABCA2 <4 08/30/2007    No results found for: UXN235  No results found for: TDD220  No results found for: URK270  No results found for: CA2729  No components found for: HGQUANT  No results found for: CEA1 / No results found for: CEA1   No results found for: AFPTUMOR  No results found for: [place]  No results found for: PSA1  No visits with results within 3 Day(s) from this visit.  Latest known visit with results is:  Abstract on 09/15/2018  Component Date Value Ref Range Status  . HM Mammogram 08/30/2018 0-4 Bi-Rad  0-4 Bi-Rad, Self Reported Normal Final   Left breast, suspicious  of malignacy. Biopsy recommended    (this displays the last labs from the last 3 days)  No results found for: TOTALPROTELP, ALBUMINELP, A1GS, A2GS, BETS, BETA2SER, GAMS, MSPIKE, SPEI (this displays SPEP labs)  No results found for: KPAFRELGTCHN, LAMBDASER, KAPLAMBRATIO (kappa/lambda light chains)  No results found for: HGBA, HGBA2QUANT, HGBFQUANT, HGBSQUAN (Hemoglobinopathy evaluation)   Lab Results  Component Value Date   LDH 169 08/30/2007    Lab Results  Component Value Date   IRON 78 01/01/2009   TIBC 424 08/30/2007    IRONPCTSAT 17.9 (L) 01/01/2009   (Iron and TIBC)  Lab Results  Component Value Date   FERRITIN 19 08/30/2007    Urinalysis    Component Value Date/Time   LABSPEC 1.015 04/09/2008 1548   PHURINE 7.0 04/09/2008 1548   HGBUR large 04/09/2008 1548   BILIRUBINUR negative 04/09/2008 1548   UROBILINOGEN 0.2 04/09/2008 1548   NITRITE negative 04/09/2008 1548     STUDIES:  Ct Chest W Contrast  Result Date: 09/14/2018 CLINICAL DATA:  New diagnosis of left breast cancer. Left mastectomy and tram flap reconstruction. EXAM: CT  CHEST WITH CONTRAST TECHNIQUE: Multidetector CT imaging of the chest was performed during intravenous contrast administration. CONTRAST:  47m OMNIPAQUE IOHEXOL 300 MG/ML  SOLN COMPARISON:  None. FINDINGS: Cardiovascular: The heart size is normal. No substantial pericardial effusion. Mediastinum/Nodes: No mediastinal lymphadenopathy. There is no hilar lymphadenopathy. The esophagus has normal imaging features. There is no axillary lymphadenopathy. Lungs/Pleura: The central tracheobronchial airways are patent. No suspicious pulmonary nodule or mass. No focal airspace consolidation. No pleural effusion. Upper Abdomen: 11 mm low-density lesion upper pole left kidney incompletely visualize, but 9 mm low-density lesion seen at this location on abdomen CT of 10/15/2015 and imaging features most suggestive of benign etiology. Musculoskeletal: No worrisome lytic or sclerotic osseous abnormality. 1.8 cm soft tissue lesion noted lateral left breast. IMPRESSION: 1. No evidence for metastatic disease in the thorax. 2. 1.8 cm soft tissue lesion lateral left breast compatible with reported history of newly diagnosed left breast cancer. Electronically Signed   By: EMisty StanleyM.D.   On: 09/14/2018 19:59    ELIGIBLE FOR AVAILABLE RESEARCH PROTOCOL:    ASSESSMENT: 53y.o. Paige [place], [place]  (1) history of left-sided ductal carcinoma in situ 2004  (a) s/pleft mastectomy with TRAM  reconstruction  (b) status post tamoxifen x3 years  (2) left breast upper outer quadrant biopsy 08/30/2018 shows a clinical T1c N0 invasive ductal carcinoma, grade 3, estrogen receptor positive, progesterone receptor negative, with no HER-2 amplification, and and MIB-1 of 15%.   (3) neoadjuvant chemotherapy will consist of carboplatin, docetaxel, trastuzumab and Pertuzumab starting 09/26/2018, repeated every 21 days x 6.  (4) definitive surgery to follow  (5) adjuvant radiation therapy as appropriate  (6) antiestrogens to follow at the completion of local treatment   PLAN: IKalieghhas a very complicated schedule right now but everything will sort out by the time she starts chemotherapy.  She would like to be treated on Friday to minimize the impact on her job.  I think she will be able to do this.  As far as other visits, shots etc. she works until 2 so she prefers after 230 appointment.  I showed her the images of her MRI which shows a very well demarcated tumor.  I also gave her a copy of her CT scan which shows no evidence of stage IV disease.  This is very good news.  We again reviewed the possible toxicity side effects and complications of the various agents that she will be receiving and she received a copy of the routing map on how to take her supportive medications.  She will receive her first cycle on 09/29/2018.  She will receive her a Udenyca on Monday.  She will see u[place]approximately a week after her first cycle to make sure she tolerated it well and to troubleshoot side effects before cycle #2.    She knows to call for any other issues that may develop before her next visit     , GVirgie Dad MD  09/18/18 10:35 AM Medical On[facility] [place] [id number]. [phone]   Fax. [phone]  I, AJacqualyn Poseyam acting as a sEducation administratorfor GChauncey Cruel MD.   I, GLurline DelMD, have reviewed the  above documentation for accuracy and completeness, and I agree with the above.

## 2018-09-17 ENCOUNTER — Ambulatory Visit
Admission: RE | Admit: 2018-09-17 | Discharge: 2018-09-17 | Disposition: A | Discharge status: Home / Self Care | Payer: BLUE CROSS/BLUE SHIELD | Source: Ambulatory Visit | Attending: Oncology | Admitting: Oncology

## 2018-09-17 DIAGNOSIS — C50912 Malignant neoplasm of unspecified site of left female breast: Secondary | ICD-10-CM

## 2018-09-17 DIAGNOSIS — Z170 Estrogen receptor positive status [ER+]: Principal | ICD-10-CM

## 2018-09-17 DIAGNOSIS — C50412 Malignant neoplasm of upper-outer quadrant of left female breast: Secondary | ICD-10-CM

## 2018-09-17 MED ORDER — GADOBUTROL 1 MMOL/ML IV SOLN
8.0000 mL | Freq: Once | INTRAVENOUS | Status: AC | PRN
  Administered 2018-09-17: 8 mL via INTRAVENOUS

## 2018-09-18 ENCOUNTER — Telehealth: Payer: Self-pay | Admitting: Oncology

## 2018-09-18 ENCOUNTER — Inpatient Hospital Stay (HOSPITAL_BASED_OUTPATIENT_CLINIC_OR_DEPARTMENT_OTHER): Payer: BLUE CROSS/BLUE SHIELD | Admitting: Oncology

## 2018-09-18 ENCOUNTER — Ambulatory Visit (HOSPITAL_COMMUNITY): Payer: BLUE CROSS/BLUE SHIELD | Attending: Cardiology

## 2018-09-18 ENCOUNTER — Inpatient Hospital Stay: Payer: BLUE CROSS/BLUE SHIELD

## 2018-09-18 VITALS — BP 165/68 | HR 61 | Temp 98.5°F | Resp 18 | Ht 62.5 in | Wt 164.8 lb

## 2018-09-18 DIAGNOSIS — Z170 Estrogen receptor positive status [ER+]: Secondary | ICD-10-CM | POA: Diagnosis present

## 2018-09-18 DIAGNOSIS — Z79899 Other long term (current) drug therapy: Secondary | ICD-10-CM

## 2018-09-18 DIAGNOSIS — C50412 Malignant neoplasm of upper-outer quadrant of left female breast: Secondary | ICD-10-CM | POA: Insufficient documentation

## 2018-09-18 DIAGNOSIS — C50912 Malignant neoplasm of unspecified site of left female breast: Secondary | ICD-10-CM | POA: Diagnosis present

## 2018-09-18 DIAGNOSIS — Z853 Personal history of malignant neoplasm of breast: Secondary | ICD-10-CM | POA: Diagnosis not present

## 2018-09-18 DIAGNOSIS — Z9012 Acquired absence of left breast and nipple: Secondary | ICD-10-CM | POA: Diagnosis not present

## 2018-09-18 LAB — ECHOCARDIOGRAM COMPLETE
HEIGHTINCHES: 62.5 in
Weight: 2636.8 oz

## 2018-09-18 MED ORDER — LIDOCAINE-PRILOCAINE 2.5-2.5 % EX CREA: TOPICAL_CREAM | CUTANEOUS | 3 refills | Status: DC

## 2018-09-18 MED ORDER — DEXAMETHASONE 4 MG PO TABS: 8.0000 mg | ORAL_TABLET | Freq: Two times a day (BID) | ORAL | 1 refills | Status: DC

## 2018-09-18 MED ORDER — PROCHLORPERAZINE MALEATE 10 MG PO TABS: 10.0000 mg | ORAL_TABLET | Freq: Four times a day (QID) | ORAL | 1 refills | Status: DC | PRN

## 2018-09-18 MED ORDER — LORAZEPAM 0.5 MG PO TABS: 0.5000 mg | ORAL_TABLET | Freq: Every evening | ORAL | 0 refills | Status: DC | PRN

## 2018-09-18 NOTE — Patient Instructions (Addendum)
Paige Foster  05-17-1966     Your procedure is scheduled on:  09-25-2018   Report to [facility] Main  Entrance,  Report to admitting at  5:30 AM    Call this number if you have problems the morning of surgery [phone]        Remember: NO SOLID FOOD AFTER MIDNIGHT THE NIGHT PRIOR TO SURGERY.  NOTHING BY MOUTH EXCEPT CLEAR LIQUIDS UNTIL 3 HOURS PRIOR TO SCHEULED SURGERY 4:30 AM.   PLEASE FINISH ENSURE DRINK PER SURGEON ORDER 3 HOURS PRIOR TO SCHEDULED SURGERY TIME WHICH NEEDS TO BE COMPLETED AT  4:30 AM.  NOTHING BY MOUTH AFTER 4:30 AM   INCLUDING NO WATER, CANDY, MINTS.    BRUSH YOUR TEETH MORNING OF SURGERY AND RINSE YOUR MOUTH OUT        Take these medicines the morning of surgery with A SIP OF WATER:   NONE                                  You may not have any metal on your body including hair pins and               piercings  Do not wear jewelry, make-up, lotions, powders or perfumes, deodorant              Do not wear nail polish.  Do not shave  48 hours prior to surgery.                   Do not bring valuables to the hospital. [place].  Contacts, dentures or bridgework may not be worn into surgery.  Leave suitcase in the car. After surgery it may be brought to your room.     Patients discharged the day of surgery will not be allowed to drive home. IF YOU ARE HAVING SURGERY AND GOING HOME THE SAME DAY, YOU MUST HAVE AN ADULT TO DRIVE YOU HOME AND BE WITH YOU FOR 24 HOURS. YOU MAY GO HOME BY TAXI OR UBER OR ORTHERWISE, BUT AN ADULT MUST ACCOMPANY YOU HOME AND STAY WITH YOU FOR 24 HOURS.    Name and phone number of your driver:  Husband-- Catha Ontko  [phone]   _____________________________________________________________________    CLEAR LIQUID DIET   Foods Allowed                                                                     Foods Excluded  Coffee  and tea, regular and decaf                             liquids that you cannot  Plain Jell-O in any flavor  see through such as: No Fruit ices (not with fruit pulp)                                     Milk/ cream products, soups, orange juice  Iced Popsicles                                    All solid food Carbonated beverages, regular and diet                                    Cranberry, grape and apple juices Sports drinks like Gatorade Lightly seasoned clear broth or consume(fat free) Sugar, honey syrup   _____________________________________________________________________            [facility] - Preparing for Surgery Before surgery, you can play an important role.  Because skin is not sterile, your skin needs to be as free of germs as possible.  You can reduce the number of germs on your skin by washing with CHG (chlorahexidine gluconate) soap before surgery.  CHG is an antiseptic cleaner which kills germs and bonds with the skin to continue killing germs even after washing. Please DO NOT use if you have an allergy to CHG or antibacterial soaps.  If your skin becomes reddened/irritated stop using the CHG and inform your nurse when you arrive at Short Stay. Do not shave (including legs and underarms) for at least 48 hours prior to the first CHG shower.  You may shave your face/neck. Please follow these instructions carefully:  1.  Shower with CHG Soap the night before surgery and the  morning of Surgery.  2.  If you choose to wash your hair, wash your hair first as usual with your  normal  shampoo.  3.  After you shampoo, rinse your hair and body thoroughly to remove the  shampoo.                            4.  Use CHG as you would any other liquid soap.  You can apply chg directly  to the skin and wash                       Gently with a scrungie or clean washcloth.  5.  Apply the CHG Soap to your body ONLY FROM THE NECK DOWN.   Do not use  on face/ open                           Wound or open sores. Avoid contact with eyes, ears mouth and genitals (private parts).                       Wash face,  Genitals (private parts) with your normal soap.             6.  Wash thoroughly, paying special attention to the area where your surgery  will be performed.  7.  Thoroughly rinse your body with warm water from the neck down.  8.  DO NOT shower/wash with your normal soap after using and rinsing off  the CHG Soap.  9.  Pat yourself dry with a clean towel.            10.  Wear clean pajamas.            11.  Place clean sheets on your bed the night of your first shower and do not  sleep with pets. Day of Surgery : Do not apply any lotions/deodorants the morning of surgery.  Please wear clean clothes to the hospital/surgery center.  FAILURE TO FOLLOW THESE INSTRUCTIONS MAY RESULT IN THE CANCELLATION OF YOUR SURGERY PATIENT SIGNATURE_________________________________  NURSE SIGNATURE__________________________________  ________________________________________________________________________

## 2018-09-18 NOTE — Telephone Encounter (Signed)
Gave avs and calendar patient kept her 3/9 appointment with genectics

## 2018-09-19 ENCOUNTER — Telehealth: Payer: Self-pay | Admitting: Medical Oncology

## 2018-09-19 ENCOUNTER — Encounter (HOSPITAL_COMMUNITY)
Admission: RE | Admit: 2018-09-19 | Discharge: 2018-09-19 | Disposition: A | Discharge status: Home / Self Care | Payer: BLUE CROSS/BLUE SHIELD | Source: Ambulatory Visit | Attending: General Surgery | Admitting: General Surgery

## 2018-09-19 ENCOUNTER — Encounter (HOSPITAL_COMMUNITY): Payer: Self-pay | Admitting: *Deleted

## 2018-09-19 ENCOUNTER — Other Ambulatory Visit: Payer: Self-pay

## 2018-09-19 DIAGNOSIS — Z01818 Encounter for other preprocedural examination: Secondary | ICD-10-CM | POA: Insufficient documentation

## 2018-09-19 HISTORY — DX: Atherosclerosis of renal artery: I70.1

## 2018-09-19 HISTORY — DX: Presence of spectacles and contact lenses: Z97.3

## 2018-09-19 HISTORY — DX: Personal history of colon polyps, unspecified: Z86.0100

## 2018-09-19 HISTORY — DX: Malignant neoplasm of unspecified site of left female breast: C50.912

## 2018-09-19 HISTORY — DX: Personal history of colonic polyps: Z86.010

## 2018-09-19 LAB — BASIC METABOLIC PANEL
ANION GAP: 7 (ref 5–15)
BUN: 16 mg/dL (ref 6–20)
CO2: 26 mmol/L (ref 22–32)
Calcium: 9.5 mg/dL (ref 8.9–10.3)
Chloride: 106 mmol/L (ref 98–111)
Creatinine, Ser: 0.81 mg/dL (ref 0.44–1.00)
GFR calc non Af Amer: >60 mL/min (ref >60)
Glucose, Bld: 105 mg/dL — ABNORMAL HIGH (ref 70–99)
POTASSIUM: 3.7 mmol/L (ref 3.5–5.1)
Sodium: 139 mmol/L (ref 135–145)

## 2018-09-19 LAB — CBC
HCT: 39.6 % (ref 36.0–46.0)
Hemoglobin: 12.7 g/dL (ref 12.0–15.0)
MCH: 30.8 pg (ref 26.0–34.0)
MCHC: 32.1 g/dL (ref 30.0–36.0)
MCV: 95.9 fL (ref 80.0–100.0)
NRBC: 0 % (ref 0.0–0.2)
Platelets: 273 10*3/uL (ref 150–400)
RBC: 4.13 MIL/uL (ref 3.87–5.11)
RDW: 12.4 % (ref 11.5–15.5)
WBC: 8.3 10*3/uL (ref 4.0–10.5)

## 2018-09-19 MED ORDER — CHLORHEXIDINE GLUCONATE CLOTH 2 % EX PADS
6.0000 | MEDICATED_PAD | Freq: Once | CUTANEOUS | Status: DC
  Filled 2018-09-19: qty 6

## 2018-09-19 MED ORDER — ENSURE PRE-SURGERY PO LIQD
296.0000 mL | Freq: Once | ORAL | Status: DC
  Filled 2018-09-19: qty 296

## 2018-09-19 NOTE — Progress Notes (Addendum)
ECHO dated 09-18-2018 in epic.  ADDENDUM:  Final EKG dated 09-19-2018 in epic.

## 2018-09-19 NOTE — Telephone Encounter (Signed)
UPBEAT Dr. Jana Hakim referred patient to study and asked that I reach out to patient to see if she was interested in participation. I spoke with patient and introduced myself and explained to her that Dr. Jana Hakim had referred her to the study. I explained to patient that he had forgotten to mention the study to her when she was here to see him yesterday and asked that I give her a call regarding study. I gave patient a brief explanation about the study, what's involved with the study, such as the cardiac MRI's the neurocognitive assessments and the physical function assessments as well. I informed the patient that this is completely voluntary and the study pays for the cardiac MRI. I also informed patient that should she be interested and eligible for the study we would need to review the consent together first and that all baseline assessments would need to be completed prior to her starting chemotherapy, which is scheduled for March 6th. Patient stated she did not have time right now to answer a few of my eligibility questions, but asked that I call her back on Thursday to follow-up with her. All of patient's questions answered to her satisfaction and patient encouraged to call me in the meantime. Patient thanked for her time. Maxwell Marion, RN, BSN, [facility] Clinical Research 09/19/2018 3:49 PM

## 2018-09-21 ENCOUNTER — Telehealth: Payer: Self-pay | Admitting: Medical Oncology

## 2018-09-21 NOTE — Telephone Encounter (Signed)
UPBEAT I called patient and left a detailed VM with patient regarding study. I informed patient about the study and what the assessments we would need to complete, piror to her starting treatment on the 6th of March. Patient was informed again, that study participation is completely voluntary and before proceeding with any study activities we would need to review the consent and authorization together. I informed patient that I will be out of the office for a work conference and provided her with research Floyd Patterson's phone number for any questions while I am out. Patient was also provided with my contact information if she wished to leave me a voice message with me while I am out. Patient thanked for her time and consideration of study. Maxwell Marion, RN, BSN, [facility] Clinical Research 09/21/2018 9:31 AM

## 2018-09-22 NOTE — Progress Notes (Signed)
Notified of surgery time change. Instructed patient to report to admitting at [facility] for surgery at 1023. NPO after midnight with exception of pre surgery drink. Instructed to finish by 0715. Paige Foster verbalized understanding.

## 2018-09-25 ENCOUNTER — Encounter (HOSPITAL_COMMUNITY)
Admission: RE | Disposition: A | Discharge status: Home / Self Care | Payer: Self-pay | Source: Home / Self Care | Attending: General Surgery

## 2018-09-25 ENCOUNTER — Ambulatory Visit (HOSPITAL_COMMUNITY): Payer: BLUE CROSS/BLUE SHIELD | Admitting: Certified Registered"

## 2018-09-25 ENCOUNTER — Ambulatory Visit (HOSPITAL_COMMUNITY): Payer: BLUE CROSS/BLUE SHIELD

## 2018-09-25 ENCOUNTER — Telehealth: Payer: Self-pay | Admitting: Medical Oncology

## 2018-09-25 ENCOUNTER — Ambulatory Visit (HOSPITAL_COMMUNITY): Payer: BLUE CROSS/BLUE SHIELD | Admitting: Physician Assistant

## 2018-09-25 ENCOUNTER — Encounter (HOSPITAL_COMMUNITY): Payer: Self-pay | Admitting: *Deleted

## 2018-09-25 ENCOUNTER — Ambulatory Visit (HOSPITAL_COMMUNITY)
Admission: RE | Admit: 2018-09-25 | Discharge: 2018-09-25 | Disposition: A | Discharge status: Home / Self Care | Payer: BLUE CROSS/BLUE SHIELD | Attending: General Surgery | Admitting: General Surgery

## 2018-09-25 DIAGNOSIS — I10 Essential (primary) hypertension: Secondary | ICD-10-CM | POA: Insufficient documentation

## 2018-09-25 DIAGNOSIS — Z8249 Family history of ischemic heart disease and other diseases of the circulatory system: Secondary | ICD-10-CM | POA: Diagnosis not present

## 2018-09-25 DIAGNOSIS — C50912 Malignant neoplasm of unspecified site of left female breast: Secondary | ICD-10-CM | POA: Insufficient documentation

## 2018-09-25 DIAGNOSIS — Z79899 Other long term (current) drug therapy: Secondary | ICD-10-CM | POA: Diagnosis not present

## 2018-09-25 DIAGNOSIS — Z95828 Presence of other vascular implants and grafts: Secondary | ICD-10-CM

## 2018-09-25 DIAGNOSIS — Z833 Family history of diabetes mellitus: Secondary | ICD-10-CM | POA: Insufficient documentation

## 2018-09-25 HISTORY — PX: PORTACATH PLACEMENT: SHX2246

## 2018-09-25 SURGERY — INSERTION, TUNNELED CENTRAL VENOUS DEVICE, WITH PORT
Anesthesia: General

## 2018-09-25 MED ORDER — PROPOFOL 10 MG/ML IV BOLUS
INTRAVENOUS | Status: DC | PRN
  Administered 2018-09-25: 160 ug via INTRAVENOUS

## 2018-09-25 MED ORDER — LACTATED RINGERS IV SOLN: INTRAVENOUS | Status: DC

## 2018-09-25 MED ORDER — FENTANYL CITRATE (PF) 100 MCG/2ML IJ SOLN: 25.0000 ug | INTRAMUSCULAR | Status: DC | PRN

## 2018-09-25 MED ORDER — FENTANYL CITRATE (PF) 100 MCG/2ML IJ SOLN
INTRAMUSCULAR | Status: AC
  Filled 2018-09-25: qty 2

## 2018-09-25 MED ORDER — CEFAZOLIN SODIUM-DEXTROSE 2-4 GM/100ML-% IV SOLN
2.0000 g | INTRAVENOUS | Status: AC
  Administered 2018-09-25: 2 g via INTRAVENOUS
  Filled 2018-09-25: qty 100

## 2018-09-25 MED ORDER — MIDAZOLAM HCL 2 MG/2ML IJ SOLN
INTRAMUSCULAR | Status: DC | PRN
  Administered 2018-09-25: 2 mg via INTRAVENOUS

## 2018-09-25 MED ORDER — HEPARIN SOD (PORK) LOCK FLUSH 100 UNIT/ML IV SOLN
INTRAVENOUS | Status: DC | PRN
  Administered 2018-09-25: 500 [IU] via INTRAVENOUS

## 2018-09-25 MED ORDER — FENTANYL CITRATE (PF) 100 MCG/2ML IJ SOLN
INTRAMUSCULAR | Status: DC | PRN
  Administered 2018-09-25 (×2): 25 ug via INTRAVENOUS
  Administered 2018-09-25: 50 ug via INTRAVENOUS

## 2018-09-25 MED ORDER — BUPIVACAINE HCL (PF) 0.25 % IJ SOLN
INTRAMUSCULAR | Status: AC
  Filled 2018-09-25: qty 30

## 2018-09-25 MED ORDER — EPHEDRINE SULFATE-NACL 50-0.9 MG/10ML-% IV SOSY
PREFILLED_SYRINGE | INTRAVENOUS | Status: DC | PRN
  Administered 2018-09-25 (×2): 5 mg via INTRAVENOUS

## 2018-09-25 MED ORDER — MEPERIDINE HCL 50 MG/ML IJ SOLN: 6.2500 mg | INTRAMUSCULAR | Status: DC | PRN

## 2018-09-25 MED ORDER — TRAMADOL HCL 50 MG PO TABS: 100.0000 mg | ORAL_TABLET | Freq: Four times a day (QID) | ORAL | 0 refills | Status: DC | PRN

## 2018-09-25 MED ORDER — LACTATED RINGERS IV SOLN
INTRAVENOUS | Status: DC | PRN
  Administered 2018-09-25: 09:00:00 via INTRAVENOUS

## 2018-09-25 MED ORDER — HEPARIN SOD (PORK) LOCK FLUSH 100 UNIT/ML IV SOLN
INTRAVENOUS | Status: AC
  Filled 2018-09-25: qty 5

## 2018-09-25 MED ORDER — PROPOFOL 10 MG/ML IV BOLUS
INTRAVENOUS | Status: AC
  Filled 2018-09-25: qty 20

## 2018-09-25 MED ORDER — METOCLOPRAMIDE HCL 5 MG/ML IJ SOLN: 10.0000 mg | Freq: Once | INTRAMUSCULAR | Status: DC | PRN

## 2018-09-25 MED ORDER — SODIUM CHLORIDE 0.9 % IV SOLN
INTRAVENOUS | Status: DC | PRN
  Administered 2018-09-25: 10:00:00

## 2018-09-25 MED ORDER — MIDAZOLAM HCL 2 MG/2ML IJ SOLN
INTRAMUSCULAR | Status: AC
  Filled 2018-09-25: qty 2

## 2018-09-25 MED ORDER — ONDANSETRON HCL 4 MG/2ML IJ SOLN
INTRAMUSCULAR | Status: AC
  Filled 2018-09-25: qty 2

## 2018-09-25 MED ORDER — LIDOCAINE 2% (20 MG/ML) 5 ML SYRINGE
INTRAMUSCULAR | Status: DC | PRN
  Administered 2018-09-25: 60 mg via INTRAVENOUS

## 2018-09-25 MED ORDER — ONDANSETRON HCL 4 MG/2ML IJ SOLN
INTRAMUSCULAR | Status: DC | PRN
  Administered 2018-09-25: 4 mg via INTRAVENOUS

## 2018-09-25 MED ORDER — GABAPENTIN 100 MG PO CAPS
100.0000 mg | ORAL_CAPSULE | ORAL | Status: AC
  Administered 2018-09-25: 100 mg via ORAL
  Filled 2018-09-25: qty 1

## 2018-09-25 MED ORDER — SODIUM CHLORIDE 0.9 % IV SOLN
Freq: Once | INTRAVENOUS | Status: DC
  Filled 2018-09-25: qty 1.2

## 2018-09-25 MED ORDER — ACETAMINOPHEN 500 MG PO TABS
1000.0000 mg | ORAL_TABLET | ORAL | Status: AC
  Administered 2018-09-25: 1000 mg via ORAL
  Filled 2018-09-25: qty 2

## 2018-09-25 MED ORDER — LIDOCAINE 2% (20 MG/ML) 5 ML SYRINGE
INTRAMUSCULAR | Status: AC
  Filled 2018-09-25: qty 5

## 2018-09-25 MED ORDER — BUPIVACAINE-EPINEPHRINE 0.25% -1:200000 IJ SOLN
INTRAMUSCULAR | Status: DC | PRN
  Administered 2018-09-25: 5 mL

## 2018-09-25 MED ORDER — DEXAMETHASONE SODIUM PHOSPHATE 10 MG/ML IJ SOLN
INTRAMUSCULAR | Status: DC | PRN
  Administered 2018-09-25: 8 mg via INTRAVENOUS

## 2018-09-25 MED ORDER — DEXAMETHASONE SODIUM PHOSPHATE 10 MG/ML IJ SOLN
INTRAMUSCULAR | Status: AC
  Filled 2018-09-25: qty 1

## 2018-09-25 SURGICAL SUPPLY — 41 items
ADH SKN CLS APL DERMABOND .7 (GAUZE/BANDAGES/DRESSINGS) ×1
APL SKNCLS STERI-STRIP NONHPOA (GAUZE/BANDAGES/DRESSINGS)
BAG DECANTER FOR FLEXI CONT (MISCELLANEOUS) ×3 IMPLANT
BENZOIN TINCTURE PRP APPL 2/3 (GAUZE/BANDAGES/DRESSINGS) IMPLANT
BLADE SURG 15 STRL LF DISP TIS (BLADE) ×1 IMPLANT
BLADE SURG 15 STRL SS (BLADE) ×3
BLADE SURG SZ11 CARB STEEL (BLADE) ×3 IMPLANT
CHLORAPREP W/TINT 26ML (MISCELLANEOUS) ×3 IMPLANT
COVER SURGICAL LIGHT HANDLE (MISCELLANEOUS) ×3 IMPLANT
COVER WAND RF STERILE (DRAPES) IMPLANT
DECANTER SPIKE VIAL GLASS SM (MISCELLANEOUS) ×3 IMPLANT
DERMABOND ADVANCED (GAUZE/BANDAGES/DRESSINGS) ×2
DERMABOND ADVANCED .7 DNX12 (GAUZE/BANDAGES/DRESSINGS) ×1 IMPLANT
DRAPE C-ARM 42X120 X-RAY (DRAPES) ×3 IMPLANT
DRAPE LAPAROSCOPIC ABDOMINAL (DRAPES) ×3 IMPLANT
DRSG TEGADERM 2-3/8X2-3/4 SM (GAUZE/BANDAGES/DRESSINGS) IMPLANT
DRSG TEGADERM 4X4.75 (GAUZE/BANDAGES/DRESSINGS) IMPLANT
ELECT PENCIL ROCKER SW 15FT (MISCELLANEOUS) ×3 IMPLANT
ELECT REM PT RETURN 15FT ADLT (MISCELLANEOUS) ×3 IMPLANT
GAUZE 4X4 16PLY RFD (DISPOSABLE) ×3 IMPLANT
GAUZE SPONGE 4X4 12PLY STRL (GAUZE/BANDAGES/DRESSINGS) IMPLANT
GLOVE BIO SURGEON STRL SZ7 (GLOVE) ×3 IMPLANT
GLOVE BIOGEL PI IND STRL 7.5 (GLOVE) ×1 IMPLANT
GLOVE BIOGEL PI INDICATOR 7.5 (GLOVE) ×2
GOWN STRL REUS W/TWL LRG LVL3 (GOWN DISPOSABLE) ×3 IMPLANT
GOWN STRL REUS W/TWL XL LVL3 (GOWN DISPOSABLE) ×3 IMPLANT
KIT BASIN OR (CUSTOM PROCEDURE TRAY) ×3 IMPLANT
KIT PORT POWER 8FR ISP CVUE (Port) ×2 IMPLANT
NDL HYPO 25X1 1.5 SAFETY (NEEDLE) ×1 IMPLANT
NEEDLE HYPO 25X1 1.5 SAFETY (NEEDLE) ×3 IMPLANT
PACK BASIC VI WITH GOWN DISP (CUSTOM PROCEDURE TRAY) ×3 IMPLANT
SUT MNCRL AB 4-0 PS2 18 (SUTURE) ×3 IMPLANT
SUT PROLENE 2 0 SH DA (SUTURE) ×3 IMPLANT
SUT SILK 2 0 (SUTURE)
SUT SILK 2-0 30XBRD TIE 12 (SUTURE) IMPLANT
SUT VIC AB 3-0 SH 27 (SUTURE) ×3
SUT VIC AB 3-0 SH 27XBRD (SUTURE) ×1 IMPLANT
SYR 10ML LL (SYRINGE) ×3 IMPLANT
SYR CONTROL 10ML LL (SYRINGE) ×3 IMPLANT
TOWEL OR 17X26 10 PK STRL BLUE (TOWEL DISPOSABLE) ×3 IMPLANT
TOWEL OR NON WOVEN STRL DISP B (DISPOSABLE) ×3 IMPLANT

## 2018-09-25 NOTE — Discharge Instructions (Signed)
    PORT-A-CATH: POST OP INSTRUCTIONS  Always review your discharge instruction sheet given to you by the facility where your surgery was performed.   1. A prescription for pain medication may be given to you upon discharge. Take your pain medication as prescribed, if needed. If narcotic pain medicine is not needed, then you make take acetaminophen (Tylenol) or ibuprofen (Advil) as needed.  2. Take your usually prescribed medications unless otherwise directed. 3. If you need a refill on your pain medication, please contact our office. All narcotic pain medicine now requires a paper prescription.  Phoned in and fax refills are no longer allowed by law.  Prescriptions will not be filled after 5 pm or on weekends.  4. You should follow a light diet for the remainder of the day after your procedure. 5. Most patients will experience some mild swelling and/or bruising in the area of the incision. It may take several days to resolve. 6. It is common to experience some constipation if taking pain medication after surgery. Increasing fluid intake and taking a stool softener (such as Colace) will usually help or prevent this problem from occurring. A mild laxative (Milk of Magnesia or Miralax) should be taken according to package directions if there are no bowel movements after 48 hours.  7. Unless discharge instructions indicate otherwise, you may remove your bandages 48 hours after surgery, and you may shower at that time. You may have steri-strips (small white skin tapes) in place directly over the incision.  These strips should be left on the skin for 7-10 days.  If your surgeon used Dermabond (skin glue) on the incision, you may shower in 24 hours.  The glue will flake off over the next 2-3 weeks.  8. If your port is left accessed at the end of surgery (needle left in port), the dressing cannot get wet and should only by changed by a healthcare professional. When the port is no longer accessed (when the  needle has been removed), follow step 7.   9. ACTIVITIES:  Limit activity involving your arms for the next 72 hours. Do no strenuous exercise or activity for 1 week. You may drive when you are no longer taking prescription pain medication, you can comfortably wear a seatbelt, and you can maneuver your car. 10.You may need to see your doctor in the office for a follow-up appointment.  Please       check with your doctor.  11.When you receive a new Port-a-Cath, you will get a product guide and        ID card.  Please keep them in case you need them.  WHEN TO CALL YOUR DOCTOR ([phone]): 1. Fever over 101.0 2. Chills 3. Continued bleeding from incision 4. Increased redness and tenderness at the site 5. Shortness of breath, difficulty breathing   The clinic staff is available to answer your questions during regular business hours. Please don't hesitate to call and ask to speak to one of the nurses or medical assistants for clinical concerns. If you have a medical emergency, go to the nearest emergency room or call 911.  A surgeon from [facility] is always on call at the hospital.     For further information, please visit [email or web address]      

## 2018-09-25 NOTE — Anesthesia Procedure Notes (Signed)
Procedure Name: LMA Insertion Date/Time: 09/25/2018 9:58 AM Performed by: Eben Burow, CRNA Pre-anesthesia Checklist: Patient identified, Emergency Drugs available, Suction available, Patient being monitored and Timeout performed Patient Re-evaluated:Patient Re-evaluated prior to induction Oxygen Delivery Method: Circle system utilized Preoxygenation: Pre-oxygenation with 100% oxygen Induction Type: IV induction Ventilation: Mask ventilation without difficulty LMA: LMA inserted LMA Size: 4.0 Tube secured with: Tape Dental Injury: Teeth and Oropharynx as per pre-operative assessment

## 2018-09-25 NOTE — Transfer of Care (Signed)
Immediate Anesthesia Transfer of Care Note  Patient: Paige Foster  Procedure(s) Performed: INSERTION PORT-A-CATH WITH ULTRASOUND (N/A )  Patient Location: PACU  Anesthesia Type:General  Level of Consciousness: awake, alert  and oriented  Airway & Oxygen Therapy: Patient Spontanous Breathing and Patient connected to face mask oxygen  Post-op Assessment: Report given to RN and Post -op Vital signs reviewed and stable  Post vital signs: Reviewed and stable  Last Vitals:  Vitals Value Taken Time  BP 151/96 09/25/2018 10:42 AM  Temp    Pulse 91 09/25/2018 10:43 AM  Resp 8 09/25/2018 10:43 AM  SpO2 100 % 09/25/2018 10:43 AM  Vitals shown include unvalidated device data.  Last Pain:  Vitals:   09/25/18 0843  TempSrc:   PainSc: 0-No pain         Complications: No apparent anesthesia complications

## 2018-09-25 NOTE — Interval H&P Note (Signed)
History and Physical Interval Note:  09/25/2018 9:20 AM  Paige Foster  has presented today for surgery, with the diagnosis of breast cancer  The various methods of treatment have been discussed with the patient and family. After consideration of risks, benefits and other options for treatment, the patient has consented to  Procedure(s): INSERTION PORT-A-CATH WITH ULTRASOUND (N/A) as a surgical intervention .  The patient's history has been reviewed, patient examined, no change in status, stable for surgery.  I have reviewed the patient's chart and labs.  Questions were answered to the patient's satisfaction.     Rolm Bookbinder

## 2018-09-25 NOTE — H&P (Signed)
67 yof referred by Dr Luan Pulling for recurrent left breast cancer. she was treated in 2003 by Drs Estill Batten and [place] with left mastectomy/sn/tram flap for a 15 cm area of hg dcis that was margin negative with alh and adh in pathology. she had sn biopsy that did show isolated cytokeratin pos cell in node. she had right sided reduction. this was at least er pos. she then underwent antiestrogen therapy she thinks five years. she does not recall genetics. she has done well until recently she noted a left breast mass in high uoq. she was evaluated in June of 2019 and this was thought to be fat necrosis. she then was recommended three month follow up but wasnt able to do this until recently. she underwent US that shows a 1.8x1.7x1.4 cm mass. there are no visible nodes in her axilla by [place]. 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 she is here to discuss options  Past Surgical History (Tanisha A. Owens Shark, [place]; 09/06/2018 2:10 PM) Breast Biopsy  Left. Breast Mass; Local Excision  Left. Breast Reconstruction  Left. Mastectomy  Left.  Diagnostic Studies History (Tanisha A. Owens Shark, [place]; 09/06/2018 2:10 PM) Colonoscopy  1-5 years ago Mammogram  within last year Pap Smear  1-5 years ago  Allergies (Tanisha A. Owens Shark, [place]; 09/06/2018 2:11 PM) No Known Drug Allergies [09/06/2018]: Allergies Reconciled   Medication History (Tanisha A. Owens Shark, [place]; 09/06/2018 2:12 PM) amLODIPine-Atorvastatin (10-10MG  Tablet, Oral) Active. Losartan Potassium (50MG  Tablet, Oral) Active. Medications Reconciled  Social History (Tanisha A. Owens Shark, [place]; 09/06/2018 2:10 PM) Alcohol use  Occasional alcohol use. Caffeine use  Coffee. No drug use  Tobacco use  Never smoker.  Family History (Tanisha A. Owens Shark, [place]; 09/06/2018 2:10 PM) Diabetes Mellitus  Mother. Heart Disease  Mother. Hypertension  Mother.  Pregnancy / Birth History  (Tanisha A. Owens Shark, [place]; 09/06/2018 2:10 PM) Age at menarche  23 years. Contraceptive History  Oral contraceptives. Maternal age  11-35 Para  2  Other Problems (Tanisha A. Owens Shark, [place]; 09/06/2018 2:10 PM) Breast Cancer  High blood pressure  Lump In Breast   Review of Systems (Tanisha A. Brown RMA; 09/06/2018 2:10 PM) General Not Present- Appetite Loss, Chills, Fatigue, Fever, Night Sweats, Weight Gain and Weight Loss. Skin Not Present- Change in Wart/Mole, Dryness, Hives, Jaundice, New Lesions, Non-Healing Wounds, Rash and Ulcer. HEENT Not Present- Earache, Hearing Loss, Hoarseness, Nose Bleed, Oral Ulcers, Ringing in the Ears, Seasonal Allergies, Sinus Pain, Sore Throat, Visual Disturbances, Wears glasses/contact lenses and Yellow Eyes. Respiratory Not Present- Bloody sputum, Chronic Cough, Difficulty Breathing, Snoring and Wheezing. Breast Not Present- Breast Mass, Breast Pain, Nipple Discharge and Skin Changes. Cardiovascular Not Present- Chest Pain, Difficulty Breathing Lying Down, Leg Cramps, Palpitations, Rapid Heart Rate, Shortness of Breath and Swelling of Extremities. Gastrointestinal Not Present- Abdominal Pain, Bloating, Bloody Stool, Change in Bowel Habits, Chronic diarrhea, Constipation, Difficulty Swallowing, Excessive gas, Gets full quickly at meals, Hemorrhoids, Indigestion, Nausea, Rectal Pain and Vomiting. Female Genitourinary Not Present- Frequency, Nocturia, Painful Urination, Pelvic Pain and Urgency. Musculoskeletal Not Present- Back Pain, Joint Pain, Joint Stiffness, Muscle Pain, Muscle Weakness and Swelling of Extremities. Neurological Not Present- Decreased Memory, Fainting, Headaches, Numbness, Seizures, Tingling, Tremor, Trouble walking and Weakness. Psychiatric Not Present- Anxiety, Bipolar, Change in Sleep Pattern, Depression, Fearful and Frequent crying. Endocrine Not Present- Cold Intolerance, Excessive Hunger, Hair Changes, Heat Intolerance, Hot flashes and New  Diabetes.  Hematology Not Present- Blood Thinners, Easy Bruising, Excessive bleeding, Gland problems, HIV and Persistent Infections.  Vitals (Tanisha A. Brown RMA; 09/06/2018 2:11 PM) 09/06/2018 2:10 PM Weight: 165 lb Height: 64in Body Surface Area: 1.8 m Body Mass Index: 28.32 kg/m  Temp.: 98.49F  Pulse: 92 (Regular)  BP: 146/88 (Sitting, Left Arm, Standard) Physical Exam Rolm Bookbinder MD; 09/06/2018 10:17 PM) General Mental Status-Alert. Head and Neck Trachea-midline. Thyroid Gland Characteristics - normal size and consistency. Eye Sclera/Conjunctiva - Bilateral-No scleral icterus. Chest and Lung Exam Chest and lung exam reveals -quiet, even and easy respiratory effort with no use of accessory muscles and on auscultation, normal breath sounds, no adventitious sounds and normal vocal resonance. Breast Nipples Discharge - Right - None. Note: right breast without masses left breast reconstructed with tram flap, uoq out of flap there is a 2 cm hard mass that is mobile Cardiovascular Cardiovascular examination reveals -normal heart sounds, regular rate and rhythm with no murmurs. Abdomen Note: soft nontender no hepatomegaly s/p tram Neurologic Neurologic evaluation reveals -alert and oriented x 3 with no impairment of recent or remote memory. Lymphatic Head & Neck General Head & Neck Lymphatics: Bilateral - Description - Normal. Axillary General Axillary Region: Bilateral - Description - Normal. Note: no [place] adenopathy   Assessment & Plan Rolm Bookbinder MD; 09/06/2018 10:24 PM) RECURRENT BREAST CANCER, LEFT (C50.912) Story: oncology appt, lumpectomy/radiotherapy, genetics I think this is amenable to excision with lumpectomy folllowed by radiation. we discussed this today. She will also need systemic therapy in form of chemotherapy plus antiher2 therapy. She likely should see genetics as well for panel testing given young age at first cancer and  this recurrence. not unreasonable to do systemic therapy first and we discussed a port today.

## 2018-09-25 NOTE — Interval H&P Note (Signed)
History and Physical Interval Note:  09/25/2018 7:34 AM  Paige Foster  has presented today for surgery, with the diagnosis of breast cancer  The various methods of treatment have been discussed with the patient and family. After consideration of risks, benefits and other options for treatment, the patient has consented to  Procedure(s): INSERTION PORT-A-CATH WITH ULTRASOUND (N/A) as a surgical intervention .  The patient's history has been reviewed, patient examined, no change in status, stable for surgery.  I have reviewed the patient's chart and labs.  Questions were answered to the patient's satisfaction.     Rolm Bookbinder

## 2018-09-25 NOTE — Anesthesia Postprocedure Evaluation (Signed)
Anesthesia Post Note  Patient: Paige Foster  Procedure(s) Performed: INSERTION PORT-A-CATH WITH ULTRASOUND (N/A )     Patient location during evaluation: PACU Anesthesia Type: General Level of consciousness: awake and alert Pain management: pain level controlled Vital Signs Assessment: post-procedure vital signs reviewed and stable Respiratory status: spontaneous breathing, nonlabored ventilation, respiratory function stable and patient connected to nasal cannula oxygen Cardiovascular status: blood pressure returned to baseline and stable Postop Assessment: no apparent nausea or vomiting Anesthetic complications: no    Last Vitals:  Vitals:   09/25/18 1115 09/25/18 1130  BP: (!) 154/82   Pulse: 70   Resp: 13   Temp: 36.4 C 36.6 C  SpO2: 96% 97%    Last Pain:  Vitals:   09/25/18 1130  TempSrc:   PainSc: 0-No pain                 Montez Hageman

## 2018-09-25 NOTE — Op Note (Signed)
Preoperative diagnosis:recurrent left breast cancer Postoperative diagnosis: same as above Procedure: right ij US guided powerport insertion Surgeon: Dr Serita Grammes EBL: minimal Anes: general  Specimensnone Complications none Drains none Sponge count correct Dispo to pacu stable  Indications: This is a21 yof with recurrent left breast cancer.  We discussed all options and elected to proceed with systemic therapy.She is due to begin chemotherapy. We discussed port placement.  Procedure: After informed consent was obtained the patient was taken to the operating room. She was given antibiotics. Sequential compression devices were on her legs. She was then placed under general anesthesia. Then she was prepped and draped in the standard sterile surgical fashion. Surgical timeout was then performed.   Ithenused the ultrasound to identify the right internal jugular vein. I then accessed the vein using the ultrasound.This aspirated blood. I then placed the wire. This was confirmed by fluoroscopy and ultrasound to be in the correct position.I then infiltrated marcaine and made anincision. I created a pocket.I tunneled the line between the 2 sites.I then dilated the tract and placed the dilator assembly with the sheath. This was done under fluoroscopy. I then removed the sheath and dilator. The wire was also removed. The line was then pulled back to be in the venacava. I hooked this up to the port. I sutured this into place with 2-0 Prolene in 2 places. This aspirated blood and flushed easily.This was confirmed with a final fluoroscopy. I then closed this with 2-0 Vicryl and 4-0 Monocryl.This withdrew blood and I placed heparin in it.Dermabond was placed on both the incisions.She tolerated this well and was transferred to the recovery room in stable condition

## 2018-09-25 NOTE — Anesthesia Preprocedure Evaluation (Signed)
Anesthesia Evaluation  Patient identified by MRN, date of birth, ID band Patient awake    Reviewed: Allergy & Precautions, NPO status , Patient's Chart, lab work & pertinent test results  Airway Mallampati: II  TM Distance: >3 FB Neck ROM: Full    Dental no notable dental hx.    Pulmonary neg pulmonary ROS,    Pulmonary exam normal breath sounds clear to auscultation       Cardiovascular hypertension, Pt. on medications Normal cardiovascular exam Rhythm:Regular Rate:Normal     Neuro/Psych negative neurological ROS  negative psych ROS   GI/Hepatic negative GI ROS, Neg liver ROS,   Endo/Other  negative endocrine ROS  Renal/GU negative Renal ROS  negative genitourinary   Musculoskeletal negative musculoskeletal ROS (+)   Abdominal   Peds negative pediatric ROS (+)  Hematology negative hematology ROS (+)   Anesthesia Other Findings   Reproductive/Obstetrics negative OB ROS                             Anesthesia Physical Anesthesia Plan  ASA: II  Anesthesia Plan: General   Post-op Pain Management:    Induction: Intravenous  PONV Risk Score and Plan: 3 and Ondansetron and Treatment may vary due to age or medical condition  Airway Management Planned: LMA  Additional Equipment:   Intra-op Plan:   Post-operative Plan: Extubation in OR  Informed Consent: I have reviewed the patients History and Physical, chart, labs and discussed the procedure including the risks, benefits and alternatives for the proposed anesthesia with the patient or authorized representative who has indicated his/her understanding and acceptance.     Dental advisory given  Plan Discussed with: CRNA  Anesthesia Plan Comments:         Anesthesia Quick Evaluation

## 2018-09-25 NOTE — Telephone Encounter (Signed)
[facility] referral Follow up call to patient regarding study and to see if patient had questions or interest in participating. Patient denies having any questions. Patient states that she does not have time to participate in the study. Patient states that she will continue to work during her treatment and does not have time and feels overwhelmed with new diagnosis. I thanked patient for her time and consideration of study and encouraged her to call Dr. Jana Hakim with any questions or concerns.  Maxwell Marion, RN, BSN, [facility] Clinical Research 09/25/2018 4:26 PM

## 2018-09-26 ENCOUNTER — Ambulatory Visit: Payer: BLUE CROSS/BLUE SHIELD

## 2018-09-26 ENCOUNTER — Encounter (HOSPITAL_COMMUNITY): Payer: Self-pay | Admitting: General Surgery

## 2018-09-27 ENCOUNTER — Telehealth: Payer: Self-pay

## 2018-09-27 NOTE — Telephone Encounter (Signed)
Patient called to inform that she was told there was a confirmed case of the corona virus at her husband's [place] in [place].  Patient is asymptomatic.  Inquiring about upcoming appointment tomorrow.    Nurse reviewed with AD and Infection Control. Pt will wear mask at appointment.  Encourage to isolate from husband X 14 days.  If symptoms were to develop such as fever, aches, respiratory symptoms, to notify the health department.  Pt voiced understanding.  Per review with MD and AD, no current notifications of any + cases locally.

## 2018-09-28 ENCOUNTER — Inpatient Hospital Stay: Payer: BLUE CROSS/BLUE SHIELD

## 2018-09-28 ENCOUNTER — Inpatient Hospital Stay: Payer: BLUE CROSS/BLUE SHIELD | Attending: Oncology

## 2018-09-28 DIAGNOSIS — Z79899 Other long term (current) drug therapy: Secondary | ICD-10-CM | POA: Diagnosis not present

## 2018-09-28 DIAGNOSIS — Z9012 Acquired absence of left breast and nipple: Secondary | ICD-10-CM | POA: Diagnosis not present

## 2018-09-28 DIAGNOSIS — Z5112 Encounter for antineoplastic immunotherapy: Secondary | ICD-10-CM | POA: Diagnosis present

## 2018-09-28 DIAGNOSIS — Z95828 Presence of other vascular implants and grafts: Secondary | ICD-10-CM | POA: Insufficient documentation

## 2018-09-28 DIAGNOSIS — Z5111 Encounter for antineoplastic chemotherapy: Secondary | ICD-10-CM | POA: Diagnosis present

## 2018-09-28 DIAGNOSIS — I10 Essential (primary) hypertension: Secondary | ICD-10-CM | POA: Diagnosis not present

## 2018-09-28 DIAGNOSIS — Z5189 Encounter for other specified aftercare: Secondary | ICD-10-CM | POA: Insufficient documentation

## 2018-09-28 DIAGNOSIS — C50412 Malignant neoplasm of upper-outer quadrant of left female breast: Secondary | ICD-10-CM | POA: Diagnosis not present

## 2018-09-28 DIAGNOSIS — Z170 Estrogen receptor positive status [ER+]: Secondary | ICD-10-CM | POA: Diagnosis not present

## 2018-09-28 DIAGNOSIS — C50912 Malignant neoplasm of unspecified site of left female breast: Secondary | ICD-10-CM

## 2018-09-28 HISTORY — DX: Presence of other vascular implants and grafts: Z95.828

## 2018-09-28 LAB — COMPREHENSIVE METABOLIC PANEL
ALT: 10 U/L (ref 0–44)
ANION GAP: 8 (ref 5–15)
AST: 14 U/L — ABNORMAL LOW (ref 15–41)
Albumin: 4 g/dL (ref 3.5–5.0)
Alkaline Phosphatase: 86 U/L (ref 38–126)
BUN: 10 mg/dL (ref 6–20)
CO2: 25 mmol/L (ref 22–32)
Calcium: 9.6 mg/dL (ref 8.9–10.3)
Chloride: 104 mmol/L (ref 98–111)
Creatinine, Ser: 0.68 mg/dL (ref 0.44–1.00)
GFR calc non Af Amer: >60 mL/min (ref >60)
GLUCOSE: 98 mg/dL (ref 70–99)
Potassium: 3.7 mmol/L (ref 3.5–5.1)
Sodium: 137 mmol/L (ref 135–145)
Total Bilirubin: 0.3 mg/dL (ref 0.3–1.2)
Total Protein: 7.4 g/dL (ref 6.5–8.1)

## 2018-09-28 LAB — CBC WITH DIFFERENTIAL/PLATELET
Abs Immature Granulocytes: 0.01 10*3/uL (ref 0.00–0.07)
Basophils Absolute: 0 10*3/uL (ref 0.0–0.1)
Basophils Relative: 1 %
Eosinophils Absolute: 0.1 10*3/uL (ref 0.0–0.5)
Eosinophils Relative: 1 %
HCT: 36.1 % (ref 36.0–46.0)
Hemoglobin: 12.1 g/dL (ref 12.0–15.0)
Immature Granulocytes: 0 %
Lymphocytes Relative: 43 %
Lymphs Abs: 3.5 10*3/uL (ref 0.7–4.0)
MCH: 30.9 pg (ref 26.0–34.0)
MCHC: 33.5 g/dL (ref 30.0–36.0)
MCV: 92.3 fL (ref 80.0–100.0)
Monocytes Absolute: 0.7 10*3/uL (ref 0.1–1.0)
Monocytes Relative: 8 %
NRBC: 0 % (ref 0.0–0.2)
Neutro Abs: 3.8 10*3/uL (ref 1.7–7.7)
Neutrophils Relative %: 47 %
Platelets: 280 10*3/uL (ref 150–400)
RBC: 3.91 MIL/uL (ref 3.87–5.11)
RDW: 12.5 % (ref 11.5–15.5)
WBC: 8.1 10*3/uL (ref 4.0–10.5)

## 2018-09-28 MED ORDER — SODIUM CHLORIDE 0.9% FLUSH
10.0000 mL | INTRAVENOUS | Status: DC | PRN
  Administered 2018-09-28: 10 mL via INTRAVENOUS
  Filled 2018-09-28: qty 10

## 2018-09-28 MED ORDER — SODIUM CHLORIDE 0.9% FLUSH
10.0000 mL | Freq: Once | INTRAVENOUS | Status: DC
  Filled 2018-09-28: qty 10

## 2018-09-28 MED ORDER — HEPARIN SOD (PORK) LOCK FLUSH 100 UNIT/ML IV SOLN
500.0000 [IU] | Freq: Once | INTRAVENOUS | Status: DC
  Filled 2018-09-28: qty 5

## 2018-09-28 MED ORDER — HEPARIN SOD (PORK) LOCK FLUSH 100 UNIT/ML IV SOLN
500.0000 [IU] | Freq: Once | INTRAVENOUS | Status: AC
  Administered 2018-09-28: 500 [IU] via INTRAVENOUS
  Filled 2018-09-28: qty 5

## 2018-09-29 ENCOUNTER — Encounter: Payer: Self-pay | Admitting: Oncology

## 2018-09-29 ENCOUNTER — Other Ambulatory Visit: Payer: Self-pay | Admitting: Adult Health

## 2018-09-29 ENCOUNTER — Inpatient Hospital Stay: Payer: BLUE CROSS/BLUE SHIELD

## 2018-09-29 ENCOUNTER — Telehealth: Payer: Self-pay | Admitting: Adult Health

## 2018-09-29 VITALS — BP 133/85 | HR 67 | Temp 99.1°F | Resp 18

## 2018-09-29 DIAGNOSIS — Z170 Estrogen receptor positive status [ER+]: Secondary | ICD-10-CM

## 2018-09-29 DIAGNOSIS — C50412 Malignant neoplasm of upper-outer quadrant of left female breast: Secondary | ICD-10-CM | POA: Diagnosis not present

## 2018-09-29 DIAGNOSIS — C50912 Malignant neoplasm of unspecified site of left female breast: Secondary | ICD-10-CM

## 2018-09-29 LAB — THYROID PANEL WITH TSH
Free Thyroxine Index: 2.3 (ref 1.2–4.9)
T3 Uptake Ratio: 28 % (ref 24–39)
T4, Total: 8.3 ug/dL (ref 4.5–12.0)
TSH: 0.558 u[IU]/mL (ref 0.450–4.500)

## 2018-09-29 MED ORDER — SODIUM CHLORIDE 0.9% FLUSH
10.0000 mL | INTRAVENOUS | Status: DC | PRN
  Administered 2018-09-29: 10 mL
  Filled 2018-09-29: qty 10

## 2018-09-29 MED ORDER — ACETAMINOPHEN 325 MG PO TABS
ORAL_TABLET | ORAL | Status: AC
  Filled 2018-09-29: qty 2

## 2018-09-29 MED ORDER — HEPARIN SOD (PORK) LOCK FLUSH 100 UNIT/ML IV SOLN
500.0000 [IU] | Freq: Once | INTRAVENOUS | Status: AC | PRN
  Administered 2018-09-29: 500 [IU]
  Filled 2018-09-29: qty 5

## 2018-09-29 MED ORDER — PALONOSETRON HCL INJECTION 0.25 MG/5ML
0.2500 mg | Freq: Once | INTRAVENOUS | Status: AC
  Administered 2018-09-29: 0.25 mg via INTRAVENOUS

## 2018-09-29 MED ORDER — TRASTUZUMAB CHEMO 150 MG IV SOLR
600.0000 mg | Freq: Once | INTRAVENOUS | Status: AC
  Administered 2018-09-29: 600 mg via INTRAVENOUS
  Filled 2018-09-29: qty 28.57

## 2018-09-29 MED ORDER — SODIUM CHLORIDE 0.9 % IV SOLN
Freq: Once | INTRAVENOUS | Status: AC
  Administered 2018-09-29: 08:00:00 via INTRAVENOUS
  Filled 2018-09-29: qty 250

## 2018-09-29 MED ORDER — SODIUM CHLORIDE 0.9 % IV SOLN
Freq: Once | INTRAVENOUS | Status: AC
  Administered 2018-09-29: 14:00:00 via INTRAVENOUS
  Filled 2018-09-29: qty 5

## 2018-09-29 MED ORDER — SODIUM CHLORIDE 0.9 % IV SOLN
840.0000 mg | Freq: Once | INTRAVENOUS | Status: AC
  Administered 2018-09-29: 840 mg via INTRAVENOUS
  Filled 2018-09-29: qty 28

## 2018-09-29 MED ORDER — PALONOSETRON HCL INJECTION 0.25 MG/5ML
INTRAVENOUS | Status: AC
  Filled 2018-09-29: qty 5

## 2018-09-29 MED ORDER — SODIUM CHLORIDE 0.9 % IV SOLN
75.0000 mg/m2 | Freq: Once | INTRAVENOUS | Status: AC
  Administered 2018-09-29: 140 mg via INTRAVENOUS
  Filled 2018-09-29: qty 14

## 2018-09-29 MED ORDER — DIPHENHYDRAMINE HCL 25 MG PO CAPS
ORAL_CAPSULE | ORAL | Status: AC
  Filled 2018-09-29: qty 1

## 2018-09-29 MED ORDER — ACETAMINOPHEN 325 MG PO TABS
650.0000 mg | ORAL_TABLET | Freq: Once | ORAL | Status: AC
  Administered 2018-09-29: 650 mg via ORAL

## 2018-09-29 MED ORDER — DIPHENHYDRAMINE HCL 25 MG PO CAPS
25.0000 mg | ORAL_CAPSULE | Freq: Once | ORAL | Status: AC
  Administered 2018-09-29: 25 mg via ORAL

## 2018-09-29 MED ORDER — SODIUM CHLORIDE 0.9 % IV SOLN
604.0000 mg | Freq: Once | INTRAVENOUS | Status: AC
  Administered 2018-09-29: 600 mg via INTRAVENOUS
  Filled 2018-09-29: qty 60

## 2018-09-29 NOTE — Progress Notes (Signed)
Went to infusion to introduce myself to patient as Arboriculturist and to offer available resources.  Discussed one-time $1000 Radio broadcast assistant to assist with personal expenses while going through treatment. Advised patient what is needed to apply. She verbalized understanding.  Discussed insurance OOP which patient states she has not met for the year. Advised there is copay assistance available for some of her treatment drugs and asked permission to enroll. She consented.  Enrolled patient in [place] for Herceptin and Perjeta. Patient approved for $25,000 per 12 month eligibility period for Herceptin effective 09/29/18, leaving her with only a $5 copay after insurance pays.   Patient approved for $25,000 per 12 month eligibility period for Perjeta effective 09/29/18, leaving her with only a $5 copay after insurance pays. Provided Lenise with approval letters for billing/copay monitoring.  Enrolled patient in [place] complete copay program for Udenyca. Patient successfully enrolled. Patient will be left with a $0 copay after insurance pays for each injection.  Patient will receive a copy of the approval letters in the mail for her records only.

## 2018-09-29 NOTE — Patient Instructions (Addendum)
[facility] Discharge Instructions for Patients Receiving Chemotherapy  Today you received the following chemotherapy agents  Trastuzumab (Herceptin),Pertuzumab (Perjeta), Docetaxel (Taxotere),  Carboplatin   To help prevent nausea and vomiting after your treatment, we encourage you to take your nausea medication as directed.   If you develop nausea and vomiting that is not controlled by your nausea medication, call the clinic.   BELOW ARE SYMPTOMS THAT SHOULD BE REPORTED IMMEDIATELY:  *FEVER GREATER THAN 100.5 F  *CHILLS WITH OR WITHOUT FEVER  NAUSEA AND VOMITING THAT IS NOT CONTROLLED WITH YOUR NAUSEA MEDICATION  *UNUSUAL SHORTNESS OF BREATH  *UNUSUAL BRUISING OR BLEEDING  TENDERNESS IN MOUTH AND THROAT WITH OR WITHOUT PRESENCE OF ULCERS  *URINARY PROBLEMS  *BOWEL PROBLEMS  UNUSUAL RASH Items with * indicate a potential emergency and should be followed up as soon as possible.  Feel free to call the clinic should you have any questions or concerns. The clinic phone number is [phone].  Please show the CHEMO ALERT CARD at check-in to the Emergency Department and triage nurse.  Trastuzumab injection for infusion (Herceptin) What is this medicine? TRASTUZUMAB (tras TOO zoo mab) is a monoclonal antibody. It is used to treat breast cancer and stomach cancer. This medicine may be used for other purposes; ask your health care provider or pharmacist if you have questions. COMMON BRAND NAME(S): Herceptin, KANJINTI, OGIVRI What should I tell my health care provider before I take this medicine? They need to know if you have any of these conditions: -heart disease -heart failure -lung or breathing disease, like asthma -an unusual or allergic reaction to trastuzumab, benzyl alcohol, or other medications, foods, dyes, or preservatives -pregnant or trying to get pregnant -breast-feeding How should I use this medicine? This drug is given as an infusion into a  vein. It is administered in a hospital or clinic by a specially trained health care professional. Talk to your pediatrician regarding the use of this medicine in children. This medicine is not approved for use in children. Overdosage: If you think you have taken too much of this medicine contact a poison control center or emergency room at once. NOTE: This medicine is only for you. Do not share this medicine with others. What if I miss a dose? It is important not to miss a dose. Call your doctor or health care professional if you are unable to keep an appointment. What may interact with this medicine? This medicine may interact with the following medications: -certain types of chemotherapy, such as daunorubicin, doxorubicin, epirubicin, and idarubicin This list may not describe all possible interactions. Give your health care provider a list of all the medicines, herbs, non-prescription drugs, or dietary supplements you use. Also tell them if you smoke, drink alcohol, or use illegal drugs. Some items may interact with your medicine. What should I watch for while using this medicine? Visit your doctor for checks on your progress. Report any side effects. Continue your course of treatment even though you feel ill unless your doctor tells you to stop. Call your doctor or health care professional for advice if you get a fever, chills or sore throat, or other symptoms of a cold or flu. Do not treat yourself. Try to avoid being around people who are sick. You may experience fever, chills and shaking during your first infusion. These effects are usually mild and can be treated with other medicines. Report any side effects during the infusion to your health care professional. Fever and chills usually   do not happen with later infusions. Do not become pregnant while taking this medicine or for 7 months after stopping it. Women should inform their doctor if they wish to become pregnant or think they might be  pregnant. Women of child-bearing potential will need to have a negative pregnancy test before starting this medicine. There is a potential for serious side effects to an unborn child. Talk to your health care professional or pharmacist for more information. Do not breast-feed an infant while taking this medicine or for 7 months after stopping it. Women must use effective birth control with this medicine. What side effects may I notice from receiving this medicine? Side effects that you should report to your doctor or health care professional as soon as possible: -allergic reactions like skin rash, itching or hives, swelling of the face, lips, or tongue -chest pain or palpitations -cough -dizziness -feeling faint or lightheaded, falls -fever -general ill feeling or flu-like symptoms -signs of worsening heart failure like breathing problems; swelling in your legs and feet -unusually weak or tired Side effects that usually do not require medical attention (report to your doctor or health care professional if they continue or are bothersome): -bone pain -changes in taste -diarrhea -joint pain -nausea/vomiting -weight loss This list may not describe all possible side effects. Call your doctor for medical advice about side effects. You may report side effects to FDA at 1-800-FDA-1088. Where should I keep my medicine? This drug is given in a hospital or clinic and will not be stored at home. NOTE: This sheet is a summary. It may not cover all possible information. If you have questions about this medicine, talk to your doctor, pharmacist, or health care provider.  2019 Elsevier/Gold Standard (2016-07-06 14:37:52)  Pertuzumab injection (Perjeta) What is this medicine? PERTUZUMAB (per TOOZ ue mab) is a monoclonal antibody. It is used to treat breast cancer. This medicine may be used for other purposes; ask your health care provider or pharmacist if you have questions. COMMON BRAND NAME(S):  PERJETA What should I tell my health care provider before I take this medicine? They need to know if you have any of these conditions: -heart disease -heart failure -high blood pressure -history of irregular heart beat -recent or ongoing radiation therapy -an unusual or allergic reaction to pertuzumab, other medicines, foods, dyes, or preservatives -pregnant or trying to get pregnant -breast-feeding How should I use this medicine? This medicine is for infusion into a vein. It is given by a health care professional in a hospital or clinic setting. Talk to your pediatrician regarding the use of this medicine in children. Special care may be needed. Overdosage: If you think you have taken too much of this medicine contact a poison control center or emergency room at once. NOTE: This medicine is only for you. Do not share this medicine with others. What if I miss a dose? It is important not to miss your dose. Call your doctor or health care professional if you are unable to keep an appointment. What may interact with this medicine? Interactions are not expected. Give your health care provider a list of all the medicines, herbs, non-prescription drugs, or dietary supplements you use. Also tell them if you smoke, drink alcohol, or use illegal drugs. Some items may interact with your medicine. This list may not describe all possible interactions. Give your health care provider a list of all the medicines, herbs, non-prescription drugs, or dietary supplements you use. Also tell them if you smoke, drink   alcohol, or use illegal drugs. Some items may interact with your medicine. What should I watch for while using this medicine? Your condition will be monitored carefully while you are receiving this medicine. Report any side effects. Continue your course of treatment even though you feel ill unless your doctor tells you to stop. Do not become pregnant while taking this medicine or for 7 months after  stopping it. Women should inform their doctor if they wish to become pregnant or think they might be pregnant. Women of child-bearing potential will need to have a negative pregnancy test before starting this medicine. There is a potential for serious side effects to an unborn child. Talk to your health care professional or pharmacist for more information. Do not breast-feed an infant while taking this medicine or for 7 months after stopping it. Women must use effective birth control with this medicine. Call your doctor or health care professional for advice if you get a fever, chills or sore throat, or other symptoms of a cold or flu. Do not treat yourself. Try to avoid being around people who are sick. You may experience fever, chills, and headache during the infusion. Report any side effects during the infusion to your health care professional. What side effects may I notice from receiving this medicine? Side effects that you should report to your doctor or health care professional as soon as possible: -breathing problems -chest pain or palpitations -dizziness -feeling faint or lightheaded -fever or chills -skin rash, itching or hives -sore throat -swelling of the face, lips, or tongue -swelling of the legs or ankles -unusually weak or tired Side effects that usually do not require medical attention (report to your doctor or health care professional if they continue or are bothersome): -diarrhea -hair loss -nausea, vomiting -tiredness This list may not describe all possible side effects. Call your doctor for medical advice about side effects. You may report side effects to FDA at 1-800-FDA-1088. Where should I keep my medicine? This drug is given in a hospital or clinic and will not be stored at home. NOTE: This sheet is a summary. It may not cover all possible information. If you have questions about this medicine, talk to your doctor, pharmacist, or health care provider.  2019  Elsevier/Gold Standard (2015-08-14 12:08:50)  Docetaxel injection What is this medicine? DOCETAXEL (doe se TAX el) is a chemotherapy drug. It targets fast dividing cells, like cancer cells, and causes these cells to die. This medicine is used to treat many types of cancers like breast cancer, certain stomach cancers, head and neck cancer, lung cancer, and prostate cancer. This medicine may be used for other purposes; ask your health care provider or pharmacist if you have questions. COMMON BRAND NAME(S): Docefrez, Taxotere What should I tell my health care provider before I take this medicine? They need to know if you have any of these conditions: -infection (especially a virus infection such as chickenpox, cold sores, or herpes) -liver disease -low blood counts, like low white cell, platelet, or red cell counts -an unusual or allergic reaction to docetaxel, polysorbate 80, other chemotherapy agents, other medicines, foods, dyes, or preservatives -pregnant or trying to get pregnant -breast-feeding How should I use this medicine? This drug is given as an infusion into a vein. It is administered in a hospital or clinic by a specially trained health care professional. Talk to your pediatrician regarding the use of this medicine in children. Special care may be needed. Overdosage: If you think you have taken   too much of this medicine contact a poison control center or emergency room at once. NOTE: This medicine is only for you. Do not share this medicine with others. What if I miss a dose? It is important not to miss your dose. Call your doctor or health care professional if you are unable to keep an appointment. What may interact with this medicine? -cyclosporine -erythromycin -ketoconazole -medicines to increase blood counts like filgrastim, pegfilgrastim, sargramostim -vaccines Talk to your doctor or health care professional before taking any of these  medicines: -acetaminophen -aspirin -ibuprofen -ketoprofen -naproxen This list may not describe all possible interactions. Give your health care provider a list of all the medicines, herbs, non-prescription drugs, or dietary supplements you use. Also tell them if you smoke, drink alcohol, or use illegal drugs. Some items may interact with your medicine. What should I watch for while using this medicine? Your condition will be monitored carefully while you are receiving this medicine. You will need important blood work done while you are taking this medicine. This drug may make you feel generally unwell. This is not uncommon, as chemotherapy can affect healthy cells as well as cancer cells. Report any side effects. Continue your course of treatment even though you feel ill unless your doctor tells you to stop. In some cases, you may be given additional medicines to help with side effects. Follow all directions for their use. Call your doctor or health care professional for advice if you get a fever, chills or sore throat, or other symptoms of a cold or flu. Do not treat yourself. This drug decreases your body's ability to fight infections. Try to avoid being around people who are sick. This medicine may increase your risk to bruise or bleed. Call your doctor or health care professional if you notice any unusual bleeding. This medicine may contain alcohol in the product. You may get drowsy or dizzy. Do not drive, use machinery, or do anything that needs mental alertness until you know how this medicine affects you. Do not stand or sit up quickly, especially if you are an older patient. This reduces the risk of dizzy or fainting spells. Avoid alcoholic drinks. Do not become pregnant while taking this medicine or for 6 months after stopping it. Women should inform their doctor if they wish to become pregnant or think they might be pregnant. Men should not father a child while taking this medicine and for 3  months after stopping it. There is a potential for serious side effects to an unborn child. Talk to your health care professional or pharmacist for more information. Do not breast-feed an infant while taking this medicine or for 2 weeks after stopping it. This may interfere with the ability to father a child. You should talk to your doctor or health care professional if you are concerned about your fertility. What side effects may I notice from receiving this medicine? Side effects that you should report to your doctor or health care professional as soon as possible: -allergic reactions like skin rash, itching or hives, swelling of the face, lips, or tongue -low blood counts - This drug may decrease the number of white blood cells, red blood cells and platelets. You may be at increased risk for infections and bleeding. -signs of infection - fever or chills, cough, sore throat, pain or difficulty passing urine -signs of decreased platelets or bleeding - bruising, pinpoint red spots on the skin, black, tarry stools, nosebleeds -signs of decreased red blood cells - unusually   weak or tired, fainting spells, lightheadedness -breathing problems -fast or irregular heartbeat -low blood pressure -mouth sores -nausea and vomiting -pain, swelling, redness or irritation at the injection site -pain, tingling, numbness in the hands or feet -swelling of the ankle, feet, hands -weight gain Side effects that usually do not require medical attention (report to your doctor or health care professional if they continue or are bothersome): -bone pain -complete hair loss including hair on your head, underarms, pubic hair, eyebrows, and eyelashes -diarrhea -excessive tearing -changes in the color of fingernails -loosening of the fingernails -nausea -muscle pain -red flush to skin -sweating -weak or tired This list may not describe all possible side effects. Call your doctor for medical advice about side  effects. You may report side effects to FDA at 1-800-FDA-1088. Where should I keep my medicine? This drug is given in a hospital or clinic and will not be stored at home. NOTE: This sheet is a summary. It may not cover all possible information. If you have questions about this medicine, talk to your doctor, pharmacist, or health care provider.  2019 Elsevier/Gold Standard (2017-08-08 12:07:21)   Carboplatin injection  What is this medicine? CARBOPLATIN (KAR boe pla tin) is a chemotherapy drug. It targets fast dividing cells, like cancer cells, and causes these cells to die. This medicine is used to treat ovarian cancer and many other cancers. This medicine may be used for other purposes; ask your health care provider or pharmacist if you have questions. COMMON BRAND NAME(S): Paraplatin What should I tell my health care provider before I take this medicine? They need to know if you have any of these conditions: -blood disorders -hearing problems -kidney disease -recent or ongoing radiation therapy -an unusual or allergic reaction to carboplatin, cisplatin, other chemotherapy, other medicines, foods, dyes, or preservatives -pregnant or trying to get pregnant -breast-feeding How should I use this medicine? This drug is usually given as an infusion into a vein. It is administered in a hospital or clinic by a specially trained health care professional. Talk to your pediatrician regarding the use of this medicine in children. Special care may be needed. Overdosage: If you think you have taken too much of this medicine contact a poison control center or emergency room at once. NOTE: This medicine is only for you. Do not share this medicine with others. What if I miss a dose? It is important not to miss a dose. Call your doctor or health care professional if you are unable to keep an appointment. What may interact with this medicine? -medicines for seizures -medicines to increase blood counts  like filgrastim, pegfilgrastim, sargramostim -some antibiotics like amikacin, gentamicin, neomycin, streptomycin, tobramycin -vaccines Talk to your doctor or health care professional before taking any of these medicines: -acetaminophen -aspirin -ibuprofen -ketoprofen -naproxen This list may not describe all possible interactions. Give your health care provider a list of all the medicines, herbs, non-prescription drugs, or dietary supplements you use. Also tell them if you smoke, drink alcohol, or use illegal drugs. Some items may interact with your medicine. What should I watch for while using this medicine? Your condition will be monitored carefully while you are receiving this medicine. You will need important blood work done while you are taking this medicine. This drug may make you feel generally unwell. This is not uncommon, as chemotherapy can affect healthy cells as well as cancer cells. Report any side effects. Continue your course of treatment even though you feel ill unless your doctor tells   you to stop. In some cases, you may be given additional medicines to help with side effects. Follow all directions for their use. Call your doctor or health care professional for advice if you get a fever, chills or sore throat, or other symptoms of a cold or flu. Do not treat yourself. This drug decreases your body's ability to fight infections. Try to avoid being around people who are sick. This medicine may increase your risk to bruise or bleed. Call your doctor or health care professional if you notice any unusual bleeding. Be careful brushing and flossing your teeth or using a toothpick because you may get an infection or bleed more easily. If you have any dental work done, tell your dentist you are receiving this medicine. Avoid taking products that contain aspirin, acetaminophen, ibuprofen, naproxen, or ketoprofen unless instructed by your doctor. These medicines may hide a fever. Do not become  pregnant while taking this medicine. Women should inform their doctor if they wish to become pregnant or think they might be pregnant. There is a potential for serious side effects to an unborn child. Talk to your health care professional or pharmacist for more information. Do not breast-feed an infant while taking this medicine. What side effects may I notice from receiving this medicine? Side effects that you should report to your doctor or health care professional as soon as possible: -allergic reactions like skin rash, itching or hives, swelling of the face, lips, or tongue -signs of infection - fever or chills, cough, sore throat, pain or difficulty passing urine -signs of decreased platelets or bleeding - bruising, pinpoint red spots on the skin, black, tarry stools, nosebleeds -signs of decreased red blood cells - unusually weak or tired, fainting spells, lightheadedness -breathing problems -changes in hearing -changes in vision -chest pain -high blood pressure -low blood counts - This drug may decrease the number of white blood cells, red blood cells and platelets. You may be at increased risk for infections and bleeding. -nausea and vomiting -pain, swelling, redness or irritation at the injection site -pain, tingling, numbness in the hands or feet -problems with balance, talking, walking -trouble passing urine or change in the amount of urine Side effects that usually do not require medical attention (report to your doctor or health care professional if they continue or are bothersome): -hair loss -loss of appetite -metallic taste in the mouth or changes in taste This list may not describe all possible side effects. Call your doctor for medical advice about side effects. You may report side effects to FDA at 1-800-FDA-1088. Where should I keep my medicine? This drug is given in a hospital or clinic and will not be stored at home. NOTE: This sheet is a summary. It may not cover all  possible information. If you have questions about this medicine, talk to your doctor, pharmacist, or health care provider.  2019 Elsevier/Gold Standard (2007-10-17 14:38:05)       

## 2018-09-29 NOTE — Progress Notes (Signed)
Pt reports that there was a "false alarm" and her husband has not be exposed to the coronavirus

## 2018-09-29 NOTE — Progress Notes (Signed)
Received approval letter via fax from [employer] for Paige Foster.  Patient approved for up to $15,000 over the next 12 months.  The earliest claim date is 04/02/18.  Copy of approval letter given to [facility] for billing/copay monitoring.  Patient will receive a copy in the mail for her records as well.  She has my card for any additional financial questions or concerns.

## 2018-09-29 NOTE — Telephone Encounter (Signed)
Called and spoke with [facility], followed by Dr. Noreene Filbert about patient Paige Foster.  Was noted that treatment was denied due to the insurance company not knowing whether or not an echocardiogram showed normal heart function.  I let them know that an echocardiogram was done on 09/18/2018 and showed an ejection fraction of 60-65% (normal).  I also let Dr. 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    Treatment with Docetaxel, Carboplatin, Trastuzumab and Pertuzumab approved.  Approval # [id number] from 09/29/2018 through 02/23/2019.  Wilber Bihari, NP  Time spent on phone: 8 minutes

## 2018-10-02 ENCOUNTER — Inpatient Hospital Stay: Payer: BLUE CROSS/BLUE SHIELD

## 2018-10-02 ENCOUNTER — Encounter: Payer: BLUE CROSS/BLUE SHIELD | Admitting: Licensed Clinical Social Worker

## 2018-10-02 ENCOUNTER — Encounter: Payer: Self-pay | Admitting: Oncology

## 2018-10-02 ENCOUNTER — Other Ambulatory Visit: Payer: BLUE CROSS/BLUE SHIELD

## 2018-10-02 DIAGNOSIS — C50412 Malignant neoplasm of upper-outer quadrant of left female breast: Secondary | ICD-10-CM | POA: Diagnosis not present

## 2018-10-02 DIAGNOSIS — C50912 Malignant neoplasm of unspecified site of left female breast: Secondary | ICD-10-CM

## 2018-10-02 DIAGNOSIS — Z170 Estrogen receptor positive status [ER+]: Secondary | ICD-10-CM

## 2018-10-02 MED ORDER — PEGFILGRASTIM-CBQV 6 MG/0.6ML ~~LOC~~ SOSY
6.0000 mg | PREFILLED_SYRINGE | Freq: Once | SUBCUTANEOUS | Status: AC
  Administered 2018-10-02: 6 mg via SUBCUTANEOUS

## 2018-10-02 MED ORDER — PEGFILGRASTIM-CBQV 6 MG/0.6ML ~~LOC~~ SOSY
PREFILLED_SYRINGE | SUBCUTANEOUS | Status: AC
  Filled 2018-10-02: qty 0.6

## 2018-10-02 NOTE — Patient Instructions (Signed)
Pegfilgrastim injection  What is this medicine?  PEGFILGRASTIM (PEG fil gra stim) is a long-acting granulocyte colony-stimulating factor that stimulates the growth of neutrophils, a type of white blood cell important in the body's fight against infection. It is used to reduce the incidence of fever and infection in patients with certain types of cancer who are receiving chemotherapy that affects the bone marrow, and to increase survival after being exposed to high doses of radiation.  This medicine may be used for other purposes; ask your health care provider or pharmacist if you have questions.  COMMON BRAND NAME(S): Fulphila, Neulasta, UDENYCA  What should I tell my health care provider before I take this medicine?  They need to know if you have any of these conditions:  -kidney disease  -latex allergy  -ongoing radiation therapy  -sickle cell disease  -skin reactions to acrylic adhesives (On-Body Injector only)  -an unusual or allergic reaction to pegfilgrastim, filgrastim, other medicines, foods, dyes, or preservatives  -pregnant or trying to get pregnant  -breast-feeding  How should I use this medicine?  This medicine is for injection under the skin. If you get this medicine at home, you will be taught how to prepare and give the pre-filled syringe or how to use the On-body Injector. Refer to the patient Instructions for Use for detailed instructions. Use exactly as directed. Tell your healthcare provider immediately if you suspect that the On-body Injector may not have performed as intended or if you suspect the use of the On-body Injector resulted in a missed or partial dose.  It is important that you put your used needles and syringes in a special sharps container. Do not put them in a trash can. If you do not have a sharps container, call your pharmacist or healthcare provider to get one.  Talk to your pediatrician regarding the use of this medicine in children. While this drug may be prescribed for  selected conditions, precautions do apply.  Overdosage: If you think you have taken too much of this medicine contact a poison control center or emergency room at once.  NOTE: This medicine is only for you. Do not share this medicine with others.  What if I miss a dose?  It is important not to miss your dose. Call your doctor or health care professional if you miss your dose. If you miss a dose due to an On-body Injector failure or leakage, a new dose should be administered as soon as possible using a single prefilled syringe for manual use.  What may interact with this medicine?  Interactions have not been studied.  Give your health care provider a list of all the medicines, herbs, non-prescription drugs, or dietary supplements you use. Also tell them if you smoke, drink alcohol, or use illegal drugs. Some items may interact with your medicine.  This list may not describe all possible interactions. Give your health care provider a list of all the medicines, herbs, non-prescription drugs, or dietary supplements you use. Also tell them if you smoke, drink alcohol, or use illegal drugs. Some items may interact with your medicine.  What should I watch for while using this medicine?  You may need blood work done while you are taking this medicine.  If you are going to need a MRI, CT scan, or other procedure, tell your doctor that you are using this medicine (On-Body Injector only).  What side effects may I notice from receiving this medicine?  Side effects that you should report to   your doctor or health care professional as soon as possible:  -allergic reactions like skin rash, itching or hives, swelling of the face, lips, or tongue  -back pain  -dizziness  -fever  -pain, redness, or irritation at site where injected  -pinpoint red spots on the skin  -red or dark-brown urine  -shortness of breath or breathing problems  -stomach or side pain, or pain at the shoulder  -swelling  -tiredness  -trouble passing urine or  change in the amount of urine  Side effects that usually do not require medical attention (report to your doctor or health care professional if they continue or are bothersome):  -bone pain  -muscle pain  This list may not describe all possible side effects. Call your doctor for medical advice about side effects. You may report side effects to FDA at 1-800-FDA-1088.  Where should I keep my medicine?  Keep out of the reach of children.  If you are using this medicine at home, you will be instructed on how to store it. Throw away any unused medicine after the expiration date on the label.  NOTE: This sheet is a summary. It may not cover all possible information. If you have questions about this medicine, talk to your doctor, pharmacist, or health care provider.   2019 Elsevier/Gold Standard (2017-10-17 16:57:08)

## 2018-10-02 NOTE — Progress Notes (Signed)
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  Patient approved for the grant. She has a copy of the approval letter as well as the expense sheet. Went over expenses in detail and how they are covered.  She has my card for any additional financial questions or concerns.

## 2018-10-06 ENCOUNTER — Inpatient Hospital Stay: Payer: BLUE CROSS/BLUE SHIELD | Admitting: Adult Health

## 2018-10-06 ENCOUNTER — Telehealth: Payer: Self-pay

## 2018-10-06 ENCOUNTER — Inpatient Hospital Stay: Payer: BLUE CROSS/BLUE SHIELD

## 2018-10-06 NOTE — Telephone Encounter (Signed)
Patient scheduled for lab and to see NP.  At this time patient has not shown up for appt.  This nurse called and LVM (phone did not ring went straight to VM)  to check on her and see if she is ok.  Patient had chemo on 09/29/18 and injection on 10/02/18.  Center number LOVM for patient to call back.

## 2018-10-06 NOTE — Progress Notes (Deleted)
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 At that time she underwent a left mastectomy for stage 0 (noninvasive) breast cancer, with transverse rectus abdominis (TRAM) flap construction under Dr. Towanda Foster. She also underwent a right breast reduction. She took tamoxifen for three years.  More recently she underwent bilateral diagnostic mammography with tomography and left breast ultrasonography at [facility] on 01/03/2018 showing: Breast Density Category B. There is an oval fat containing lesion in the left breast upper outer quadrant posterior depth. No other significant masses, calcifications, or other findings are seen in either breast. Sonographically, there is a 1.5 cm lesion in the left breast upper outer quadrant posterior depth. This lesion is of mixed echogenicity. This correlates as palpated and with mammography findings. Follow up was recommended.  Close follow-up was suggested.  She then presented with a non-tender mass in the left reconstructed breast on 08/30/2018. On physical exam, there is a hard palpable lump measuring 2.0 cm in the upper outer left reconstructed breast 10 cm from the expected location of a nipple. Sonography over this area demonstrates a 1.8 cm x 1.7 cm x 1.4 cm mass in the left breast at 2 o'clock posterior depth 10 cm from the nipple. This mass is of mixed echogenicity. This  abnormality is increased in size and correlates as palpated and with prior mammography findings. Color flow imaging demonstrates that there is vascularity present. Elastography imaging assessment is intermediate. No significant abnormalities were seen sonographically in the left axilla.    Accordingly on 08/30/2018 she proceeded to biopsy of the left breast mass in question. The pathology from this procedure showed ([id number]): invasive ductal carcinoma, grade III. Prognostic indicators significant for: estrogen receptor, 100% positive with strong staining intensity and progesterone receptor, 0% negative. Proliferation marker Ki67 at 15%. HER2 positive (3+) by immunohistochemistry.  The patient's subsequent history is as detailed below.   INTERVAL HISTORY: Paige Foster is here today for follow up and evaluation of her estrogen positive, HER-2 positive left sided recurrent breast cancer.  She was started on neoadjuvant chemotherapy with Docetaxel, Carboplatin, Trastuzumab and Pertuzumab on 09/29/2018 which is given on day 1 of a 21 day cycle with Udenyca support given on day 3.    Today is cycle 1, day 8 of treatment.    REVIEW OF SYSTEMS: Paige Foster had    PAST MEDICAL HISTORY: Past Medical History:  Diagnosis Date   History of colon polyps    Hypertension    Recurrent breast cancer, left [facility]) oncologist-- dr Paige Foster    dx 2004, noninvasive Stage 0 ----s/p left mastectomy w/ tram flap construction (and right breast reduction), taken Tamoxifen for 3 yrs;   08-30-2018 recurrent left cancer , Grade III,  cT1c,  ER positive, PR negative, HER-2 positive, invasive ductal carcinoma-- neoadjuvant chemo to start 09-26-2018   Renal artery stenosis (HCC)    mild right external renal artery stenosis per duplex in epic 08-09-2013   Wears glasses    Caesarian  PAST SURGICAL HISTORY: Past Surgical History:  Procedure Laterality Date   COLONOSCOPY     MASTECTOMY Left 2004   w/  TRAM flap  construction and right breast augmentation with abdominoplasy   PORTACATH PLACEMENT N/A 09/25/2018   Procedure: INSERTION PORT-A-CATH WITH ULTRASOUND;  Surgeon: Rolm Bookbinder, MD;  Location: [facility];  Service: General;  Laterality: N/A;   TUBAL LIGATION Bilateral yrs ago     FAMILY HISTORY: Family History  Problem Relation Age of Onset   Diabetes Mother    Hypertension Mother    Kidney disease Father    Colon cancer Neg Hx    Colon polyps Neg Hx    Gallbladder disease Neg Hx    Heart disease Neg Hx    Esophageal cancer Neg Hx    Paige Foster's father died from unknown causes in his early 31's. Patients' mother died from diabetes complications at age 9. The patient has 1 sister. Patient denies anyone in her family having breast, ovarian, prostate, or pancreatic cancer.    GYNECOLOGIC HISTORY:  Patient's last menstrual period was 06/08/2007. 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 Her husband, Paige Foster, works at [employer]. Paige Foster has two children, Paige Foster and Paige Foster. Paige Foster lives with her, is 49, and it attending [employer] for a computer based degree. Paige Foster lives with her, is 74, and recently graduated from [employer] with a degree in Careers information officer. [name] has no grandchildren. She attends the [employer].    ADVANCED DIRECTIVES: Her husband, Paige Foster, is automatically her healthcare power of attorney     HEALTH MAINTENANCE: Social History   Tobacco Use   Smoking status: Never Smoker   Smokeless tobacco: Never Used  Substance Use Topics   Alcohol use: Not Currently    Alcohol/week: 0.0 standard drinks    Comment: Occassionally   Drug use: No    Colonoscopy: yes  PAP:   Bone density: yes, 2017; -1.6, osteopenic   No Known Allergies  Current Outpatient Medications    Medication Sig Dispense Refill   amLODipine (NORVASC) 10 MG tablet Take 1 tablet (10 mg total) by mouth at bedtime. 90 tablet 1   dexamethasone (DECADRON) 4 MG tablet Take 2 tablets (8 mg total) by mouth 2 (two) times daily. Start the day before Taxotere. Take once the day after, then 2 times a day x 2d. 30 tablet 1   Flaxseed, Linseed, (FLAXSEED OIL) 1000 MG CAPS Take 1,000 mg by mouth daily.     lidocaine-prilocaine (EMLA) cream Apply to affected area once 30 g 3   LORazepam (ATIVAN) 0.5 MG tablet Take 1 tablet (0.5 mg total) by mouth at bedtime as needed (Nausea or vomiting). 30 tablet 0   losartan (COZAAR) 50 MG tablet Take 1 tablet (50 mg total) by mouth every morning. 90 tablet 1   Nutritional Supplements (GRAPESEED EXTRACT PO) Take 1 capsule by mouth daily.     prochlorperazine (COMPAZINE) 10 MG tablet Take 1 tablet (10 mg total) by mouth every 6 (six) hours as needed (Nausea or vomiting). 30 tablet 1   Resveratrol 250 MG CAPS Take 250 mg by mouth daily.     traMADol (ULTRAM) 50 MG tablet Take 2 tablets (100 mg total) by mouth every 6 (six) hours as needed. 8 tablet 0   TURMERIC PO Take  1,000 mg by mouth daily.     No current facility-administered medications for this visit.      OBJECTIVE:   There were no vitals filed for this visit.   There is no height or weight on file to calculate BMI.   Wt Readings from Last 3 Encounters:  09/19/18 163 lb 8 oz (74.2 kg)  09/18/18 164 lb 12.8 oz (74.8 kg)  09/07/18 166 lb 6.4 oz (75.5 kg)      ECOG FS:0 - Asymptomatic GENERAL: Patient is a well appearing female in no acute distress HEENT:  Sclerae anicteric.  Oropharynx clear and moist. No ulcerations or evidence of oropharyngeal candidiasis. Neck is supple.  NODES:  No cervical, supraclavicular, or axillary lymphadenopathy palpated.  BREAST EXAM:  Deferred. LUNGS:  Clear to auscultation bilaterally.  No wheezes or rhonchi. HEART:  Regular rate and rhythm. No murmur  appreciated. ABDOMEN:  Soft, nontender.  Positive, normoactive bowel sounds. No organomegaly palpated. MSK:  No focal spinal tenderness to palpation. Full range of motion bilaterally in the upper extremities. EXTREMITIES:  No peripheral edema.   SKIN:  Clear with no obvious rashes or skin changes. No nail dyscrasia. NEURO:  Nonfocal. Well oriented.  Appropriate affect.    LAB RESULTS:  CMP     Component Value Date/Time   NA 137 09/28/2018 1501   NA 140 10/12/2017 0950   K 3.7 09/28/2018 1501   CL 104 09/28/2018 1501   CO2 25 09/28/2018 1501   GLUCOSE 98 09/28/2018 1501   BUN 10 09/28/2018 1501   BUN 10 10/12/2017 0950   CREATININE 0.68 09/28/2018 1501   CREATININE 0.78 09/07/2018 1455   CALCIUM 9.6 09/28/2018 1501   PROT 7.4 09/28/2018 1501   PROT 7.5 10/12/2017 0950   ALBUMIN 4.0 09/28/2018 1501   ALBUMIN 4.3 10/12/2017 0950   AST 14 (L) 09/28/2018 1501   AST 14 (L) 09/07/2018 1455   ALT 10 09/28/2018 1501   ALT 13 09/07/2018 1455   ALKPHOS 86 09/28/2018 1501   BILITOT 0.3 09/28/2018 1501   BILITOT 0.2 (L) 09/07/2018 1455   GFRNONAA >60 09/28/2018 1501   GFRNONAA >60 09/07/2018 1455   GFRAA >60 09/28/2018 1501   GFRAA >60 09/07/2018 1455    No results found for: TOTALPROTELP, ALBUMINELP, A1GS, A2GS, BETS, BETA2SER, GAMS, MSPIKE, SPEI  No results found for: KPAFRELGTCHN, LAMBDASER, KAPLAMBRATIO  Lab Results  Component Value Date   WBC 8.1 09/28/2018   NEUTROABS 3.8 09/28/2018   HGB 12.1 09/28/2018   HCT 36.1 09/28/2018   MCV 92.3 09/28/2018   PLT 280 09/28/2018    '@LASTCHEMISTRY'$ @  Lab Results  Component Value Date   LABCA2 <4 08/30/2007    No components found for: [id number]  No results for input(s): INR in the last 168 hours.  Lab Results  Component Value Date   LABCA2 <4 08/30/2007    No results found for: KVQ259  No results found for: DGL875  No results found for: IEP329  No results found for: CA2729  No components found for:  HGQUANT  No results found for: CEA1 / No results found for: CEA1   No results found for: AFPTUMOR  No results found for: Welch  No results found for: PSA1  No visits with results within 3 Day(s) from this visit.  Latest known visit with results is:  Appointment on 09/28/2018  Component Date Value Ref Range Status   Sodium 09/28/2018 137  135 - 145 mmol/L Final   Potassium 09/28/2018 3.7  3.5 - 5.1 mmol/L Final   Chloride 09/28/2018 104  98 - 111 mmol/L Final   CO2 09/28/2018 25  22 - 32 mmol/L Final   Glucose, Bld 09/28/2018 98  70 - 99 mg/dL Final   BUN 09/28/2018 10  6 - 20 mg/dL Final   Creatinine, Ser 09/28/2018 0.68  0.44 - 1.00 mg/dL Final   Calcium 09/28/2018 9.6  8.9 - 10.3 mg/dL Final   Total Protein 09/28/2018 7.4  6.5 - 8.1 g/dL Final   Albumin 09/28/2018 4.0  3.5 - 5.0 g/dL Final   AST 09/28/2018 14* 15 - 41 U/L Final   ALT 09/28/2018 10  0 - 44 U/L Final   Alkaline Phosphatase 09/28/2018 86  38 - 126 U/L Final   Total Bilirubin 09/28/2018 0.3  0.3 - 1.2 mg/dL Final   GFR calc non Af Amer 09/28/2018 >60  >60 mL/min Final   GFR calc Af Amer 09/28/2018 >60  >60 mL/min Final   Anion gap 09/28/2018 8  5 - 15 Final   Performed at [facility], [place] [address], [place], [place] [address]   WBC 09/28/2018 8.1  4.0 - 10.5 K/uL Final   RBC 09/28/2018 3.91  3.87 - 5.11 MIL/uL Final   Hemoglobin 09/28/2018 12.1  12.0 - 15.0 g/dL Final   HCT 09/28/2018 36.1  36.0 - 46.0 % Final   MCV 09/28/2018 92.3  80.0 - 100.0 fL Final   MCH 09/28/2018 30.9  26.0 - 34.0 pg Final   MCHC 09/28/2018 33.5  30.0 - 36.0 g/dL Final   RDW 09/28/2018 12.5  11.5 - 15.5 % Final   Platelets 09/28/2018 280  150 - 400 K/uL Final   nRBC 09/28/2018 0.0  0.0 - 0.2 % Final   Neutrophils Relative % 09/28/2018 47  % Final   Neutro Abs 09/28/2018 3.8  1.7 - 7.7 K/uL Final   Lymphocytes Relative 09/28/2018 43  % Final   Lymphs Abs 09/28/2018 3.5   0.7 - 4.0 K/uL Final   Monocytes Relative 09/28/2018 8  % Final   Monocytes Absolute 09/28/2018 0.7  0.1 - 1.0 K/uL Final   Eosinophils Relative 09/28/2018 1  % Final   Eosinophils Absolute 09/28/2018 0.1  0.0 - 0.5 K/uL Final   Basophils Relative 09/28/2018 1  % Final   Basophils Absolute 09/28/2018 0.0  0.0 - 0.1 K/uL Final   Immature Granulocytes 09/28/2018 0  % Final   Abs Immature Granulocytes 09/28/2018 0.01  0.00 - 0.07 K/uL Final   Performed at [facility], [place] [address] [place], [place] [address]   TSH 09/28/2018 0.558  0.450 - 4.500 uIU/mL Final   T4, Total 09/28/2018 8.3  4.5 - 12.0 ug/dL Final   T3 Uptake Ratio 09/28/2018 28  24 - 39 % Final   Free Thyroxine Index 09/28/2018 2.3  1.2 - 4.9 Final   Comment: (NOTE) Performed At: [facility] [place], [place] [id number] Rush Farmer MD [phone]     (this displays the last labs from the last 3 days)  No results found for: TOTALPROTELP, ALBUMINELP, A1GS, A2GS, BETS, BETA2SER, GAMS, MSPIKE, SPEI (this displays SPEP labs)  No results found for: KPAFRELGTCHN, LAMBDASER, KAPLAMBRATIO (kappa/lambda light chains)  No results found for: HGBA, HGBA2QUANT, HGBFQUANT, HGBSQUAN (Hemoglobinopathy evaluation)   Lab Results  Component Value Date   LDH 169 08/30/2007    Lab Results  Component Value Date   IRON 78 01/01/2009   TIBC 424  08/30/2007   IRONPCTSAT 17.9 (L) 01/01/2009   (Iron and TIBC)  Lab Results  Component Value Date   FERRITIN 19 08/30/2007    Urinalysis    Component Value Date/Time   LABSPEC 1.015 04/09/2008 1548   PHURINE 7.0 04/09/2008 1548   HGBUR large 04/09/2008 1548   BILIRUBINUR negative 04/09/2008 1548   UROBILINOGEN 0.2 04/09/2008 1548   NITRITE negative 04/09/2008 1548     STUDIES:    ELIGIBLE FOR AVAILABLE RESEARCH PROTOCOL:    ASSESSMENT: 53 y.o. [place], [place] woman  (1) history of left-sided ductal carcinoma  in situ 2004  (a) s/p left mastectomy with TRAM reconstruction  (b) status post tamoxifen x3 years  (2) left breast upper outer quadrant biopsy 08/30/2018 shows a clinical T2 N0 invasive ductal carcinoma, grade 3, estrogen receptor positive, progesterone receptor negative, with + HER-2 amplification, and and MIB-1 of 15%.   (3) neoadjuvant chemotherapy will consist of cyclophosphamide, docetaxel, trastuzumab and Pertuzumab starting 09/26/2018, repeated every 21 days x 6.  (a) echocardiogram on 09/18/2018 demonstrates a LVEF of 60-65%.    (4) definitive surgery to follow  (5) adjuvant radiation therapy as appropriate  (6) antiestrogens to follow at the completion of local treatment   PLAN:  Anaise has a good understanding of the overall plan. She agrees with it. She knows the goal of treatment in her case is cure. She will call with any problems that may develop before her next visit here.   Wilber Bihari, NP  10/06/18 11:11 AM Medical Oncology and Hematology [facility] [address] [place], [place] [address] Tel. [phone]    Fax. [phone]

## 2018-10-06 NOTE — Telephone Encounter (Signed)
FYI

## 2018-10-16 ENCOUNTER — Encounter: Payer: Self-pay | Admitting: Nurse Practitioner

## 2018-10-19 ENCOUNTER — Inpatient Hospital Stay: Payer: BLUE CROSS/BLUE SHIELD

## 2018-10-19 ENCOUNTER — Encounter: Payer: Self-pay | Admitting: Adult Health

## 2018-10-19 ENCOUNTER — Inpatient Hospital Stay (HOSPITAL_BASED_OUTPATIENT_CLINIC_OR_DEPARTMENT_OTHER): Payer: BLUE CROSS/BLUE SHIELD | Admitting: Adult Health

## 2018-10-19 ENCOUNTER — Other Ambulatory Visit: Payer: Self-pay

## 2018-10-19 VITALS — BP 109/85 | HR 87 | Temp 98.6°F | Resp 18 | Ht 62.5 in | Wt 161.1 lb

## 2018-10-19 DIAGNOSIS — Z170 Estrogen receptor positive status [ER+]: Secondary | ICD-10-CM | POA: Diagnosis not present

## 2018-10-19 DIAGNOSIS — Z9012 Acquired absence of left breast and nipple: Secondary | ICD-10-CM

## 2018-10-19 DIAGNOSIS — Z79899 Other long term (current) drug therapy: Secondary | ICD-10-CM

## 2018-10-19 DIAGNOSIS — I10 Essential (primary) hypertension: Secondary | ICD-10-CM

## 2018-10-19 DIAGNOSIS — C50412 Malignant neoplasm of upper-outer quadrant of left female breast: Secondary | ICD-10-CM

## 2018-10-19 DIAGNOSIS — C50912 Malignant neoplasm of unspecified site of left female breast: Secondary | ICD-10-CM

## 2018-10-19 LAB — COMPREHENSIVE METABOLIC PANEL
ALT: 18 U/L (ref 0–44)
AST: 15 U/L (ref 15–41)
Albumin: 4.1 g/dL (ref 3.5–5.0)
Alkaline Phosphatase: 108 U/L (ref 38–126)
Anion gap: 10 (ref 5–15)
BUN: 13 mg/dL (ref 6–20)
CO2: 25 mmol/L (ref 22–32)
Calcium: 9.6 mg/dL (ref 8.9–10.3)
Chloride: 103 mmol/L (ref 98–111)
Creatinine, Ser: 0.69 mg/dL (ref 0.44–1.00)
GFR calc Af Amer: >60 mL/min (ref >60)
GFR calc non Af Amer: >60 mL/min (ref >60)
GLUCOSE: 90 mg/dL (ref 70–99)
Potassium: 3.6 mmol/L (ref 3.5–5.1)
Sodium: 138 mmol/L (ref 135–145)
Total Bilirubin: 0.3 mg/dL (ref 0.3–1.2)
Total Protein: 7.8 g/dL (ref 6.5–8.1)

## 2018-10-19 LAB — CBC WITH DIFFERENTIAL/PLATELET
Abs Immature Granulocytes: 0.02 10*3/uL (ref 0.00–0.07)
Basophils Absolute: 0 10*3/uL (ref 0.0–0.1)
Basophils Relative: 0 %
Eosinophils Absolute: 0 10*3/uL (ref 0.0–0.5)
Eosinophils Relative: 0 %
HCT: 36.8 % (ref 36.0–46.0)
Hemoglobin: 11.8 g/dL — ABNORMAL LOW (ref 12.0–15.0)
Immature Granulocytes: 0 %
Lymphocytes Relative: 28 %
Lymphs Abs: 2 10*3/uL (ref 0.7–4.0)
MCH: 30.3 pg (ref 26.0–34.0)
MCHC: 32.1 g/dL (ref 30.0–36.0)
MCV: 94.6 fL (ref 80.0–100.0)
Monocytes Absolute: 0.7 10*3/uL (ref 0.1–1.0)
Monocytes Relative: 10 %
NRBC: 0 % (ref 0.0–0.2)
Neutro Abs: 4.4 10*3/uL (ref 1.7–7.7)
Neutrophils Relative %: 62 %
Platelets: 339 10*3/uL (ref 150–400)
RBC: 3.89 MIL/uL (ref 3.87–5.11)
RDW: 13 % (ref 11.5–15.5)
WBC: 7.1 10*3/uL (ref 4.0–10.5)

## 2018-10-19 NOTE — Progress Notes (Signed)
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 At that time she underwent a left mastectomy for stage 0 (noninvasive) breast cancer, with transverse rectus abdominis (TRAM) flap construction under Dr. Towanda Foster. She also underwent a right breast reduction. She took tamoxifen for three years.  More recently she underwent bilateral diagnostic mammography with tomography and left breast ultrasonography at [facility] on 01/03/2018 showing: Breast Density Category B. There is an oval fat containing lesion in the left breast upper outer quadrant posterior depth. No other significant masses, calcifications, or other findings are seen in either breast. Sonographically, there is a 1.5 cm lesion in the left breast upper outer quadrant posterior depth. This lesion is of mixed echogenicity. This correlates as palpated and with mammography findings. Follow up was recommended.  Close follow-up was suggested.  She then presented with a non-tender mass in the left reconstructed breast on 08/30/2018. On physical exam, there is a hard palpable lump measuring 2.0 cm in the upper outer left reconstructed breast 10 cm from the expected location of a nipple. Sonography over this area demonstrates a 1.8 cm x 1.7 cm x 1.4 cm mass in the left breast at 2 o'clock posterior depth 10 cm from the nipple. This  mass is of mixed echogenicity. This abnormality is increased in size and correlates as palpated and with prior mammography findings. Color flow imaging demonstrates that there is vascularity present. Elastography imaging assessment is intermediate. No significant abnormalities were seen sonographically in the left axilla.    Accordingly on 08/30/2018 she proceeded to biopsy of the left breast mass in question. The pathology from this procedure showed ([id number]): invasive ductal carcinoma, grade III. Prognostic indicators significant for: estrogen receptor, 100% positive with strong staining intensity and progesterone receptor, 0% negative. Proliferation marker Ki67 at 15%. HER2 positive (3+) by immunohistochemistry.  The patient's subsequent history is as detailed below.   INTERVAL HISTORY: Paige Foster returns today for follow-up and treatment of her estrogen and HER-2 positive breast cancer.   She began neoadjuvant chemotherapy will consist of carboplatin, docetaxel, trastuzumab and Pertuzumab starting 09/26/2018, repeated every 21 days x 6. With Udenyca support on day 3.  Tomorrow is cycle 2 day 1. She missed her nadir check and f/u on 3/13.  She tells me that she was unaware that she had an appointment.    REVIEW OF SYSTEMS: Paige Foster is doing well today.  She does note some taste changes.  She is eating and drinking well.  She says she had no nausea or vomiting from her chemotherapy.  She denies peripheral neuropathy.  She is minimally fatigued.  She had a couple of episodes of diarrhea, but never had to take imodium.  Paige Foster is wearing a mask today to protect herself from Covid-19.  She is grocery shopping when she has to, but wears gloves, sanitizes her hands frequently, and wears a mask.  She is cleaning her house frequently.  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  She says there is no way for her to work from home.    Paige Foster denies fevers, chills, chest pain, palpitations,  cough, shortness of breath, bowel/bladder changes, mucositis, or any other concerns.  A detailed ROS was otherwise non contributory.    PAST MEDICAL HISTORY: Past Medical History:  Diagnosis Date   History of colon polyps    Hypertension    Recurrent breast cancer, left [facility]) oncologist-- dr Paige Foster    dx 2004, noninvasive Stage 0 ----s/p left mastectomy w/ tram flap construction (and right breast reduction), taken Tamoxifen for 3 yrs;   08-30-2018 recurrent left cancer , Grade III,  cT1c,  ER positive, PR negative, HER-2 positive, invasive ductal carcinoma-- neoadjuvant chemo to start 09-26-2018   Renal artery stenosis (HCC)    mild right external renal artery stenosis per duplex in epic 08-09-2013   Wears glasses    Caesarian    PAST SURGICAL HISTORY: Past Surgical History:  Procedure Laterality Date   COLONOSCOPY     MASTECTOMY Left 2004   w/  TRAM flap construction and right breast augmentation with abdominoplasy   PORTACATH PLACEMENT N/A 09/25/2018   Procedure: INSERTION PORT-A-CATH WITH ULTRASOUND;  Surgeon: Paige Bookbinder, MD;  Location: [facility];  Service: General;  Laterality: N/A;   TUBAL LIGATION Bilateral yrs ago     FAMILY HISTORY: Family History  Problem Relation Age of Onset   Diabetes Mother    Hypertension Mother    Kidney disease Father    Colon cancer Neg Hx    Colon polyps Neg Hx    Gallbladder disease Neg Hx    Heart disease Neg Hx    Esophageal cancer Neg Hx    Paige Foster's father died from unknown causes in his early 58's. Patients' mother died from diabetes complications at age 86. The patient has 1 sister. Patient denies anyone in her family having breast, ovarian, prostate, or pancreatic cancer.    GYNECOLOGIC HISTORY:  Patient's last menstrual period was 06/08/2007. 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 Her husband, Paige Foster, works at [employer]. Paige Foster has two children, Vonna Kotyk and Shanon Brow. Vonna Kotyk lives with her, is 72, and it attending [employer] for a computer based degree. Shanon Brow lives with her, is 38, and recently graduated from [employer] with a degree in Careers information officer. Airika has no grandchildren. She attends the [employer].   ADVANCED DIRECTIVES: Her husband, Paige Foster, is automatically her healthcare power of attorney     HEALTH MAINTENANCE: Social History   Tobacco Use   Smoking status: Never Smoker   Smokeless tobacco: Never Used  Substance Use Topics   Alcohol use: Not Currently    Alcohol/week: 0.0 standard drinks    Comment: Occassionally   Drug use: No    Colonoscopy: yes  PAP:   Bone density: yes, 2017; -1.6, osteopenic   No Known Allergies  Current Outpatient Medications  Medication Sig Dispense Refill   amLODipine (NORVASC) 10 MG tablet Take 1 tablet (10 mg total) by mouth at bedtime. 90 tablet 1   dexamethasone (DECADRON) 4 MG tablet Take 2 tablets (8 mg total) by mouth 2 (two) times daily. Start the day before Taxotere. Take once the day after, then 2 times a day x 2d. 30 tablet  1   Flaxseed, Linseed, (FLAXSEED OIL) 1000 MG CAPS Take 1,000 mg by mouth daily.     lidocaine-prilocaine (EMLA) cream Apply to affected area once 30 g 3   LORazepam (ATIVAN) 0.5 MG tablet Take 1 tablet (0.5 mg total) by mouth at bedtime as needed (Nausea or vomiting). 30 tablet 0   losartan (COZAAR) 50 MG tablet Take 1 tablet (50 mg total) by mouth every morning. 90 tablet 1   Nutritional Supplements (GRAPESEED EXTRACT PO) Take 1 capsule by mouth daily.     prochlorperazine (COMPAZINE) 10 MG tablet Take 1 tablet (10 mg total) by mouth every 6 (six) hours as needed (Nausea or vomiting). 30 tablet 1   Resveratrol 250 MG CAPS Take 250 mg by mouth daily.     traMADol (ULTRAM) 50 MG tablet Take 2 tablets (100 mg  total) by mouth every 6 (six) hours as needed. 8 tablet 0   TURMERIC PO Take 1,000 mg by mouth daily.     No current facility-administered medications for this visit.      OBJECTIVE: Middle-aged African-American woman in no acute distress  Vitals:   10/19/18 1330  BP: 109/85  Pulse: 87  Resp: 18  Temp: 98.6 F (37 C)  SpO2: 100%     Body mass index is 29 kg/m.   Wt Readings from Last 3 Encounters:  10/19/18 161 lb 1.6 oz (73.1 kg)  09/19/18 163 lb 8 oz (74.2 kg)  09/18/18 164 lb 12.8 oz (74.8 kg)  ECOG FS:1 GENERAL: Patient is a well appearing female in no acute distress HEENT:  Sclerae anicteric.  Oropharynx clear and moist. No ulcerations or evidence of oropharyngeal candidiasis. Neck is supple.  NODES:  No cervical, supraclavicular, or axillary lymphadenopathy palpated.  BREAST EXAM:  Left lateral breast with 1.5cm nodule noted  LUNGS:  Clear to auscultation bilaterally.  No wheezes or rhonchi. HEART:  Regular rate and rhythm. No murmur appreciated. ABDOMEN:  Soft, nontender.  Positive, normoactive bowel sounds. No organomegaly palpated. MSK:  No focal spinal tenderness to palpation. Full range of motion bilaterally in the upper extremities. EXTREMITIES:  No peripheral edema.   SKIN:  Clear with no obvious rashes or skin changes. No nail dyscrasia. NEURO:  Nonfocal. Well oriented.  Appropriate affect.    LAB RESULTS:  CMP     Component Value Date/Time   NA 137 09/28/2018 1501   NA 140 10/12/2017 0950   K 3.7 09/28/2018 1501   CL 104 09/28/2018 1501   CO2 25 09/28/2018 1501   GLUCOSE 98 09/28/2018 1501   BUN 10 09/28/2018 1501   BUN 10 10/12/2017 0950   CREATININE 0.68 09/28/2018 1501   CREATININE 0.78 09/07/2018 1455   CALCIUM 9.6 09/28/2018 1501   PROT 7.4 09/28/2018 1501   PROT 7.5 10/12/2017 0950   ALBUMIN 4.0 09/28/2018 1501   ALBUMIN 4.3 10/12/2017 0950   AST 14 (L) 09/28/2018 1501   AST 14 (L) 09/07/2018 1455   ALT 10 09/28/2018 1501   ALT 13  09/07/2018 1455   ALKPHOS 86 09/28/2018 1501   BILITOT 0.3 09/28/2018 1501   BILITOT 0.2 (L) 09/07/2018 1455   GFRNONAA >60 09/28/2018 1501   GFRNONAA >60 09/07/2018 1455   GFRAA >60 09/28/2018 1501   GFRAA >60 09/07/2018 1455    No results found for: TOTALPROTELP, ALBUMINELP, A1GS, A2GS, BETS, BETA2SER, GAMS, MSPIKE, SPEI  No results found for: KPAFRELGTCHN, LAMBDASER, KAPLAMBRATIO  Lab Results  Component Value Date   WBC 8.1 09/28/2018  NEUTROABS 3.8 09/28/2018   HGB 12.1 09/28/2018   HCT 36.1 09/28/2018   MCV 92.3 09/28/2018   PLT 280 09/28/2018    Lab Results  Component Value Date   LABCA2 <4 08/30/2007    No components found for: [id number]  No results for input(s): INR in the last 168 hours.  Lab Results  Component Value Date   LABCA2 <4 08/30/2007    No results found for: NOI370  No results found for: WUG891  No results found for: QXI503  No results found for: CA2729  No components found for: HGQUANT  No results found for: CEA1 / No results found for: CEA1   No results found for: AFPTUMOR  No results found for: [place]  No results found for: PSA1  No visits with results within 3 Day(s) from this visit.  Latest known visit with results is:  Appointment on 09/28/2018  Component Date Value Ref Range Status   Sodium 09/28/2018 137  135 - 145 mmol/L Final   Potassium 09/28/2018 3.7  3.5 - 5.1 mmol/L Final   Chloride 09/28/2018 104  98 - 111 mmol/L Final   CO2 09/28/2018 25  22 - 32 mmol/L Final   Glucose, Bld 09/28/2018 98  70 - 99 mg/dL Final   BUN 09/28/2018 10  6 - 20 mg/dL Final   Creatinine, Ser 09/28/2018 0.68  0.44 - 1.00 mg/dL Final   Calcium 09/28/2018 9.6  8.9 - 10.3 mg/dL Final   Total Protein 09/28/2018 7.4  6.5 - 8.1 g/dL Final   Albumin 09/28/2018 4.0  3.5 - 5.0 g/dL Final   AST 09/28/2018 14* 15 - 41 U/L Final   ALT 09/28/2018 10  0 - 44 U/L Final   Alkaline Phosphatase 09/28/2018 86  38 - 126 U/L Final   Total  Bilirubin 09/28/2018 0.3  0.3 - 1.2 mg/dL Final   GFR calc non Af Amer 09/28/2018 >60  >60 mL/min Final   GFR calc Af Amer 09/28/2018 >60  >60 mL/min Final   Anion gap 09/28/2018 8  5 - 15 Final   Performed at [facility], [place] [address] [place], [place] [address]   WBC 09/28/2018 8.1  4.0 - 10.5 K/uL Final   RBC 09/28/2018 3.91  3.87 - 5.11 MIL/uL Final   Hemoglobin 09/28/2018 12.1  12.0 - 15.0 g/dL Final   HCT 09/28/2018 36.1  36.0 - 46.0 % Final   MCV 09/28/2018 92.3  80.0 - 100.0 fL Final   MCH 09/28/2018 30.9  26.0 - 34.0 pg Final   MCHC 09/28/2018 33.5  30.0 - 36.0 g/dL Final   RDW 09/28/2018 12.5  11.5 - 15.5 % Final   Platelets 09/28/2018 280  150 - 400 K/uL Final   nRBC 09/28/2018 0.0  0.0 - 0.2 % Final   Neutrophils Relative % 09/28/2018 47  % Final   Neutro Abs 09/28/2018 3.8  1.7 - 7.7 K/uL Final   Lymphocytes Relative 09/28/2018 43  % Final   Lymphs Abs 09/28/2018 3.5  0.7 - 4.0 K/uL Final   Monocytes Relative 09/28/2018 8  % Final   Monocytes Absolute 09/28/2018 0.7  0.1 - 1.0 K/uL Final   Eosinophils Relative 09/28/2018 1  % Final   Eosinophils Absolute 09/28/2018 0.1  0.0 - 0.5 K/uL Final   Basophils Relative 09/28/2018 1  % Final   Basophils Absolute 09/28/2018 0.0  0.0 - 0.1 K/uL Final   Immature Granulocytes 09/28/2018 0  % Final   Abs Immature  Granulocytes 09/28/2018 0.01  0.00 - 0.07 K/uL Final   Performed at [facility], [place] [address], [place], [place] [address]   TSH 09/28/2018 0.558  0.450 - 4.500 uIU/mL Final   T4, Total 09/28/2018 8.3  4.5 - 12.0 ug/dL Final   T3 Uptake Ratio 09/28/2018 28  24 - 39 % Final   Free Thyroxine Index 09/28/2018 2.3  1.2 - 4.9 Final   Comment: (NOTE) Performed At: [facility] [place], [place] [id number] Rush Farmer MD [phone]     (this displays the last labs from the last 3 days)  No results found for:  TOTALPROTELP, ALBUMINELP, A1GS, A2GS, BETS, BETA2SER, GAMS, MSPIKE, SPEI (this displays SPEP labs)  No results found for: KPAFRELGTCHN, LAMBDASER, KAPLAMBRATIO (kappa/lambda light chains)  No results found for: HGBA, HGBA2QUANT, HGBFQUANT, HGBSQUAN (Hemoglobinopathy evaluation)   Lab Results  Component Value Date   LDH 169 08/30/2007    Lab Results  Component Value Date   IRON 78 01/01/2009   TIBC 424 08/30/2007   IRONPCTSAT 17.9 (L) 01/01/2009   (Iron and TIBC)  Lab Results  Component Value Date   FERRITIN 19 08/30/2007    Urinalysis    Component Value Date/Time   LABSPEC 1.015 04/09/2008 1548   PHURINE 7.0 04/09/2008 1548   HGBUR large 04/09/2008 1548   BILIRUBINUR negative 04/09/2008 1548   UROBILINOGEN 0.2 04/09/2008 1548   NITRITE negative 04/09/2008 1548     STUDIES:  Dg Chest Port 1 View  Result Date: 09/25/2018 CLINICAL DATA:  Port placement. EXAM: PORTABLE CHEST 1 VIEW COMPARISON:  CT chest dated September 14, 2018. FINDINGS: Right chest wall port catheter with tip at the cavoatrial junction. The heart size and mediastinal contours are within normal limits. Normal pulmonary vascularity. No focal consolidation, pleural effusion, or pneumothorax. No acute osseous abnormality. IMPRESSION: 1. Right chest wall port catheter in good position. No pneumothorax. Electronically Signed   By: Titus Dubin M.D.   On: 09/25/2018 11:15   Dg C-arm 1-60 Min-no Report  Result Date: 09/25/2018 Fluoroscopy was utilized by the requesting physician.  No radiographic interpretation.    ELIGIBLE FOR AVAILABLE RESEARCH PROTOCOL:    ASSESSMENT: 53 y.o. [place], [place] woman  (1) history of left-sided ductal carcinoma in situ 2004  (a) s/pleft mastectomy with TRAM reconstruction  (b) status post tamoxifen x3 years  (2) left breast upper outer quadrant biopsy 08/30/2018 shows a clinical T1c N0 invasive ductal carcinoma, grade 3, estrogen receptor positive, progesterone receptor  negative, with no HER-2 amplification, and and MIB-1 of 15%.   (3) neoadjuvant chemotherapy will consist of carboplatin, docetaxel, trastuzumab and Pertuzumab starting 09/26/2018, repeated every 21 days x 6.  (4) definitive surgery to follow  (5) adjuvant radiation therapy as appropriate  (6) antiestrogens to follow at the completion of local treatment   PLAN: Kalley  Is doing well today. It sounds like she tolerating her first cycle of chemotherapy relatively well and had minimal issues with it.  She will have her labs drawn today and proceed with cycle 2 of Docetaxel, carboplatin, Trastuzumab and Pertuzumab tomorrow (so long as labs remain within parameters).    Tiasha and I reviewed the COVID 19 pandemic and the fact that patients who are receiving chemotherapy are at increased risk for medical complications  And poor outcomes due to the pandemic.  She cannot work from home, but I did recommend that she consider letting us write her out of work for the time being.  She is going to think about this.    Paige Foster will return on Monday for her injection and on 4/1 for labs and f/u with Dr. Jana Foster.  She knows to call for any other issues that may develop before her next visit.  A total of (20) minutes of face-to-face time was spent with this patient with greater than 50% of that time in counseling and care-coordination.  Wilber Bihari, NP 10/19/18 1:35 PM Medical Oncology and Hematology [facility] [address] [place], [place] [address] Tel. [phone]    Fax. [phone]

## 2018-10-20 ENCOUNTER — Inpatient Hospital Stay: Payer: BLUE CROSS/BLUE SHIELD

## 2018-10-20 ENCOUNTER — Other Ambulatory Visit: Payer: Self-pay

## 2018-10-20 VITALS — BP 127/89 | HR 85 | Temp 98.0°F | Resp 18

## 2018-10-20 DIAGNOSIS — C50412 Malignant neoplasm of upper-outer quadrant of left female breast: Secondary | ICD-10-CM

## 2018-10-20 DIAGNOSIS — C50912 Malignant neoplasm of unspecified site of left female breast: Secondary | ICD-10-CM

## 2018-10-20 DIAGNOSIS — Z170 Estrogen receptor positive status [ER+]: Secondary | ICD-10-CM

## 2018-10-20 MED ORDER — ACETAMINOPHEN 325 MG PO TABS
650.0000 mg | ORAL_TABLET | Freq: Once | ORAL | Status: AC
  Administered 2018-10-20: 650 mg via ORAL

## 2018-10-20 MED ORDER — TRASTUZUMAB CHEMO 150 MG IV SOLR
450.0000 mg | Freq: Once | INTRAVENOUS | Status: AC
  Administered 2018-10-20: 450 mg via INTRAVENOUS
  Filled 2018-10-20: qty 21.43

## 2018-10-20 MED ORDER — SODIUM CHLORIDE 0.9 % IV SOLN
604.0000 mg | Freq: Once | INTRAVENOUS | Status: AC
  Administered 2018-10-20: 600 mg via INTRAVENOUS
  Filled 2018-10-20: qty 60

## 2018-10-20 MED ORDER — SODIUM CHLORIDE 0.9 % IV SOLN
Freq: Once | INTRAVENOUS | Status: AC
  Administered 2018-10-20: 12:00:00 via INTRAVENOUS
  Filled 2018-10-20: qty 5

## 2018-10-20 MED ORDER — SODIUM CHLORIDE 0.9 % IV SOLN
75.0000 mg/m2 | Freq: Once | INTRAVENOUS | Status: AC
  Administered 2018-10-20: 140 mg via INTRAVENOUS
  Filled 2018-10-20: qty 14

## 2018-10-20 MED ORDER — DIPHENHYDRAMINE HCL 25 MG PO CAPS
25.0000 mg | ORAL_CAPSULE | Freq: Once | ORAL | Status: AC
  Administered 2018-10-20: 25 mg via ORAL

## 2018-10-20 MED ORDER — ACETAMINOPHEN 325 MG PO TABS
ORAL_TABLET | ORAL | Status: AC
  Filled 2018-10-20: qty 2

## 2018-10-20 MED ORDER — PALONOSETRON HCL INJECTION 0.25 MG/5ML
0.2500 mg | Freq: Once | INTRAVENOUS | Status: AC
  Administered 2018-10-20: 0.25 mg via INTRAVENOUS

## 2018-10-20 MED ORDER — SODIUM CHLORIDE 0.9 % IV SOLN
Freq: Once | INTRAVENOUS | Status: AC
  Administered 2018-10-20: 11:00:00 via INTRAVENOUS
  Filled 2018-10-20: qty 250

## 2018-10-20 MED ORDER — SODIUM CHLORIDE 0.9% FLUSH
10.0000 mL | INTRAVENOUS | Status: DC | PRN
  Administered 2018-10-20: 10 mL
  Filled 2018-10-20: qty 10

## 2018-10-20 MED ORDER — HEPARIN SOD (PORK) LOCK FLUSH 100 UNIT/ML IV SOLN
500.0000 [IU] | Freq: Once | INTRAVENOUS | Status: AC | PRN
  Administered 2018-10-20: 500 [IU]
  Filled 2018-10-20: qty 5

## 2018-10-20 MED ORDER — DIPHENHYDRAMINE HCL 25 MG PO CAPS
ORAL_CAPSULE | ORAL | Status: AC
  Filled 2018-10-20: qty 1

## 2018-10-20 MED ORDER — PALONOSETRON HCL INJECTION 0.25 MG/5ML
INTRAVENOUS | Status: AC
  Filled 2018-10-20: qty 5

## 2018-10-20 MED ORDER — SODIUM CHLORIDE 0.9 % IV SOLN
420.0000 mg | Freq: Once | INTRAVENOUS | Status: AC
  Administered 2018-10-20: 420 mg via INTRAVENOUS
  Filled 2018-10-20: qty 14

## 2018-10-20 NOTE — Patient Instructions (Signed)
[facility] Discharge Instructions for Patients Receiving Chemotherapy  Today you received the following chemotherapy agents  Trastuzumab (Herceptin),Pertuzumab (Perjeta), Docetaxel (Taxotere),  Carboplatin   To help prevent nausea and vomiting after your treatment, we encourage you to take your nausea medication as directed.   If you develop nausea and vomiting that is not controlled by your nausea medication, call the clinic.   BELOW ARE SYMPTOMS THAT SHOULD BE REPORTED IMMEDIATELY:  *FEVER GREATER THAN 100.5 F  *CHILLS WITH OR WITHOUT FEVER  NAUSEA AND VOMITING THAT IS NOT CONTROLLED WITH YOUR NAUSEA MEDICATION  *UNUSUAL SHORTNESS OF BREATH  *UNUSUAL BRUISING OR BLEEDING  TENDERNESS IN MOUTH AND THROAT WITH OR WITHOUT PRESENCE OF ULCERS  *URINARY PROBLEMS  *BOWEL PROBLEMS  UNUSUAL RASH Items with * indicate a potential emergency and should be followed up as soon as possible.  Feel free to call the clinic should you have any questions or concerns. The clinic phone number is [phone].  Please show the CHEMO ALERT CARD at check-in to the Emergency Department and triage nurse.  Trastuzumab injection for infusion (Herceptin) What is this medicine? TRASTUZUMAB (tras TOO zoo mab) is a monoclonal antibody. It is used to treat breast cancer and stomach cancer. This medicine may be used for other purposes; ask your health care provider or pharmacist if you have questions. COMMON BRAND NAME(S): Herceptin, KANJINTI, OGIVRI What should I tell my health care provider before I take this medicine? They need to know if you have any of these conditions: -heart disease -heart failure -lung or breathing disease, like asthma -an unusual or allergic reaction to trastuzumab, benzyl alcohol, or other medications, foods, dyes, or preservatives -pregnant or trying to get pregnant -breast-feeding How should I use this medicine? This drug is given as an infusion into a  vein. It is administered in a hospital or clinic by a specially trained health care professional. Talk to your pediatrician regarding the use of this medicine in children. This medicine is not approved for use in children. Overdosage: If you think you have taken too much of this medicine contact a poison control center or emergency room at once. NOTE: This medicine is only for you. Do not share this medicine with others. What if I miss a dose? It is important not to miss a dose. Call your doctor or health care professional if you are unable to keep an appointment. What may interact with this medicine? This medicine may interact with the following medications: -certain types of chemotherapy, such as daunorubicin, doxorubicin, epirubicin, and idarubicin This list may not describe all possible interactions. Give your health care provider a list of all the medicines, herbs, non-prescription drugs, or dietary supplements you use. Also tell them if you smoke, drink alcohol, or use illegal drugs. Some items may interact with your medicine. What should I watch for while using this medicine? Visit your doctor for checks on your progress. Report any side effects. Continue your course of treatment even though you feel ill unless your doctor tells you to stop. Call your doctor or health care professional for advice if you get a fever, chills or sore throat, or other symptoms of a cold or flu. Do not treat yourself. Try to avoid being around people who are sick. You may experience fever, chills and shaking during your first infusion. These effects are usually mild and can be treated with other medicines. Report any side effects during the infusion to your health care professional. Fever and chills usually   do not happen with later infusions. Do not become pregnant while taking this medicine or for 7 months after stopping it. Women should inform their doctor if they wish to become pregnant or think they might be  pregnant. Women of child-bearing potential will need to have a negative pregnancy test before starting this medicine. There is a potential for serious side effects to an unborn child. Talk to your health care professional or pharmacist for more information. Do not breast-feed an infant while taking this medicine or for 7 months after stopping it. Women must use effective birth control with this medicine. What side effects may I notice from receiving this medicine? Side effects that you should report to your doctor or health care professional as soon as possible: -allergic reactions like skin rash, itching or hives, swelling of the face, lips, or tongue -chest pain or palpitations -cough -dizziness -feeling faint or lightheaded, falls -fever -general ill feeling or flu-like symptoms -signs of worsening heart failure like breathing problems; swelling in your legs and feet -unusually weak or tired Side effects that usually do not require medical attention (report to your doctor or health care professional if they continue or are bothersome): -bone pain -changes in taste -diarrhea -joint pain -nausea/vomiting -weight loss This list may not describe all possible side effects. Call your doctor for medical advice about side effects. You may report side effects to FDA at 1-800-FDA-1088. Where should I keep my medicine? This drug is given in a hospital or clinic and will not be stored at home. NOTE: This sheet is a summary. It may not cover all possible information. If you have questions about this medicine, talk to your doctor, pharmacist, or health care provider.  2019 Elsevier/Gold Standard (2016-07-06 14:37:52)  Pertuzumab injection (Perjeta) What is this medicine? PERTUZUMAB (per TOOZ ue mab) is a monoclonal antibody. It is used to treat breast cancer. This medicine may be used for other purposes; ask your health care provider or pharmacist if you have questions. COMMON BRAND NAME(S):  PERJETA What should I tell my health care provider before I take this medicine? They need to know if you have any of these conditions: -heart disease -heart failure -high blood pressure -history of irregular heart beat -recent or ongoing radiation therapy -an unusual or allergic reaction to pertuzumab, other medicines, foods, dyes, or preservatives -pregnant or trying to get pregnant -breast-feeding How should I use this medicine? This medicine is for infusion into a vein. It is given by a health care professional in a hospital or clinic setting. Talk to your pediatrician regarding the use of this medicine in children. Special care may be needed. Overdosage: If you think you have taken too much of this medicine contact a poison control center or emergency room at once. NOTE: This medicine is only for you. Do not share this medicine with others. What if I miss a dose? It is important not to miss your dose. Call your doctor or health care professional if you are unable to keep an appointment. What may interact with this medicine? Interactions are not expected. Give your health care provider a list of all the medicines, herbs, non-prescription drugs, or dietary supplements you use. Also tell them if you smoke, drink alcohol, or use illegal drugs. Some items may interact with your medicine. This list may not describe all possible interactions. Give your health care provider a list of all the medicines, herbs, non-prescription drugs, or dietary supplements you use. Also tell them if you smoke, drink   alcohol, or use illegal drugs. Some items may interact with your medicine. What should I watch for while using this medicine? Your condition will be monitored carefully while you are receiving this medicine. Report any side effects. Continue your course of treatment even though you feel ill unless your doctor tells you to stop. Do not become pregnant while taking this medicine or for 7 months after  stopping it. Women should inform their doctor if they wish to become pregnant or think they might be pregnant. Women of child-bearing potential will need to have a negative pregnancy test before starting this medicine. There is a potential for serious side effects to an unborn child. Talk to your health care professional or pharmacist for more information. Do not breast-feed an infant while taking this medicine or for 7 months after stopping it. Women must use effective birth control with this medicine. Call your doctor or health care professional for advice if you get a fever, chills or sore throat, or other symptoms of a cold or flu. Do not treat yourself. Try to avoid being around people who are sick. You may experience fever, chills, and headache during the infusion. Report any side effects during the infusion to your health care professional. What side effects may I notice from receiving this medicine? Side effects that you should report to your doctor or health care professional as soon as possible: -breathing problems -chest pain or palpitations -dizziness -feeling faint or lightheaded -fever or chills -skin rash, itching or hives -sore throat -swelling of the face, lips, or tongue -swelling of the legs or ankles -unusually weak or tired Side effects that usually do not require medical attention (report to your doctor or health care professional if they continue or are bothersome): -diarrhea -hair loss -nausea, vomiting -tiredness This list may not describe all possible side effects. Call your doctor for medical advice about side effects. You may report side effects to FDA at 1-800-FDA-1088. Where should I keep my medicine? This drug is given in a hospital or clinic and will not be stored at home. NOTE: This sheet is a summary. It may not cover all possible information. If you have questions about this medicine, talk to your doctor, pharmacist, or health care provider.  2019  Elsevier/Gold Standard (2015-08-14 12:08:50)  Docetaxel injection What is this medicine? DOCETAXEL (doe se TAX el) is a chemotherapy drug. It targets fast dividing cells, like cancer cells, and causes these cells to die. This medicine is used to treat many types of cancers like breast cancer, certain stomach cancers, head and neck cancer, lung cancer, and prostate cancer. This medicine may be used for other purposes; ask your health care provider or pharmacist if you have questions. COMMON BRAND NAME(S): Docefrez, Taxotere What should I tell my health care provider before I take this medicine? They need to know if you have any of these conditions: -infection (especially a virus infection such as chickenpox, cold sores, or herpes) -liver disease -low blood counts, like low white cell, platelet, or red cell counts -an unusual or allergic reaction to docetaxel, polysorbate 80, other chemotherapy agents, other medicines, foods, dyes, or preservatives -pregnant or trying to get pregnant -breast-feeding How should I use this medicine? This drug is given as an infusion into a vein. It is administered in a hospital or clinic by a specially trained health care professional. Talk to your pediatrician regarding the use of this medicine in children. Special care may be needed. Overdosage: If you think you have taken   too much of this medicine contact a poison control center or emergency room at once. NOTE: This medicine is only for you. Do not share this medicine with others. What if I miss a dose? It is important not to miss your dose. Call your doctor or health care professional if you are unable to keep an appointment. What may interact with this medicine? -cyclosporine -erythromycin -ketoconazole -medicines to increase blood counts like filgrastim, pegfilgrastim, sargramostim -vaccines Talk to your doctor or health care professional before taking any of these  medicines: -acetaminophen -aspirin -ibuprofen -ketoprofen -naproxen This list may not describe all possible interactions. Give your health care provider a list of all the medicines, herbs, non-prescription drugs, or dietary supplements you use. Also tell them if you smoke, drink alcohol, or use illegal drugs. Some items may interact with your medicine. What should I watch for while using this medicine? Your condition will be monitored carefully while you are receiving this medicine. You will need important blood work done while you are taking this medicine. This drug may make you feel generally unwell. This is not uncommon, as chemotherapy can affect healthy cells as well as cancer cells. Report any side effects. Continue your course of treatment even though you feel ill unless your doctor tells you to stop. In some cases, you may be given additional medicines to help with side effects. Follow all directions for their use. Call your doctor or health care professional for advice if you get a fever, chills or sore throat, or other symptoms of a cold or flu. Do not treat yourself. This drug decreases your body's ability to fight infections. Try to avoid being around people who are sick. This medicine may increase your risk to bruise or bleed. Call your doctor or health care professional if you notice any unusual bleeding. This medicine may contain alcohol in the product. You may get drowsy or dizzy. Do not drive, use machinery, or do anything that needs mental alertness until you know how this medicine affects you. Do not stand or sit up quickly, especially if you are an older patient. This reduces the risk of dizzy or fainting spells. Avoid alcoholic drinks. Do not become pregnant while taking this medicine or for 6 months after stopping it. Women should inform their doctor if they wish to become pregnant or think they might be pregnant. Men should not father a child while taking this medicine and for 3  months after stopping it. There is a potential for serious side effects to an unborn child. Talk to your health care professional or pharmacist for more information. Do not breast-feed an infant while taking this medicine or for 2 weeks after stopping it. This may interfere with the ability to father a child. You should talk to your doctor or health care professional if you are concerned about your fertility. What side effects may I notice from receiving this medicine? Side effects that you should report to your doctor or health care professional as soon as possible: -allergic reactions like skin rash, itching or hives, swelling of the face, lips, or tongue -low blood counts - This drug may decrease the number of white blood cells, red blood cells and platelets. You may be at increased risk for infections and bleeding. -signs of infection - fever or chills, cough, sore throat, pain or difficulty passing urine -signs of decreased platelets or bleeding - bruising, pinpoint red spots on the skin, black, tarry stools, nosebleeds -signs of decreased red blood cells - unusually   weak or tired, fainting spells, lightheadedness -breathing problems -fast or irregular heartbeat -low blood pressure -mouth sores -nausea and vomiting -pain, swelling, redness or irritation at the injection site -pain, tingling, numbness in the hands or feet -swelling of the ankle, feet, hands -weight gain Side effects that usually do not require medical attention (report to your doctor or health care professional if they continue or are bothersome): -bone pain -complete hair loss including hair on your head, underarms, pubic hair, eyebrows, and eyelashes -diarrhea -excessive tearing -changes in the color of fingernails -loosening of the fingernails -nausea -muscle pain -red flush to skin -sweating -weak or tired This list may not describe all possible side effects. Call your doctor for medical advice about side  effects. You may report side effects to FDA at 1-800-FDA-1088. Where should I keep my medicine? This drug is given in a hospital or clinic and will not be stored at home. NOTE: This sheet is a summary. It may not cover all possible information. If you have questions about this medicine, talk to your doctor, pharmacist, or health care provider.  2019 Elsevier/Gold Standard (2017-08-08 12:07:21)   Carboplatin injection  What is this medicine? CARBOPLATIN (KAR boe pla tin) is a chemotherapy drug. It targets fast dividing cells, like cancer cells, and causes these cells to die. This medicine is used to treat ovarian cancer and many other cancers. This medicine may be used for other purposes; ask your health care provider or pharmacist if you have questions. COMMON BRAND NAME(S): Paraplatin What should I tell my health care provider before I take this medicine? They need to know if you have any of these conditions: -blood disorders -hearing problems -kidney disease -recent or ongoing radiation therapy -an unusual or allergic reaction to carboplatin, cisplatin, other chemotherapy, other medicines, foods, dyes, or preservatives -pregnant or trying to get pregnant -breast-feeding How should I use this medicine? This drug is usually given as an infusion into a vein. It is administered in a hospital or clinic by a specially trained health care professional. Talk to your pediatrician regarding the use of this medicine in children. Special care may be needed. Overdosage: If you think you have taken too much of this medicine contact a poison control center or emergency room at once. NOTE: This medicine is only for you. Do not share this medicine with others. What if I miss a dose? It is important not to miss a dose. Call your doctor or health care professional if you are unable to keep an appointment. What may interact with this medicine? -medicines for seizures -medicines to increase blood counts  like filgrastim, pegfilgrastim, sargramostim -some antibiotics like amikacin, gentamicin, neomycin, streptomycin, tobramycin -vaccines Talk to your doctor or health care professional before taking any of these medicines: -acetaminophen -aspirin -ibuprofen -ketoprofen -naproxen This list may not describe all possible interactions. Give your health care provider a list of all the medicines, herbs, non-prescription drugs, or dietary supplements you use. Also tell them if you smoke, drink alcohol, or use illegal drugs. Some items may interact with your medicine. What should I watch for while using this medicine? Your condition will be monitored carefully while you are receiving this medicine. You will need important blood work done while you are taking this medicine. This drug may make you feel generally unwell. This is not uncommon, as chemotherapy can affect healthy cells as well as cancer cells. Report any side effects. Continue your course of treatment even though you feel ill unless your doctor tells   you to stop. In some cases, you may be given additional medicines to help with side effects. Follow all directions for their use. Call your doctor or health care professional for advice if you get a fever, chills or sore throat, or other symptoms of a cold or flu. Do not treat yourself. This drug decreases your body's ability to fight infections. Try to avoid being around people who are sick. This medicine may increase your risk to bruise or bleed. Call your doctor or health care professional if you notice any unusual bleeding. Be careful brushing and flossing your teeth or using a toothpick because you may get an infection or bleed more easily. If you have any dental work done, tell your dentist you are receiving this medicine. Avoid taking products that contain aspirin, acetaminophen, ibuprofen, naproxen, or ketoprofen unless instructed by your doctor. These medicines may hide a fever. Do not become  pregnant while taking this medicine. Women should inform their doctor if they wish to become pregnant or think they might be pregnant. There is a potential for serious side effects to an unborn child. Talk to your health care professional or pharmacist for more information. Do not breast-feed an infant while taking this medicine. What side effects may I notice from receiving this medicine? Side effects that you should report to your doctor or health care professional as soon as possible: -allergic reactions like skin rash, itching or hives, swelling of the face, lips, or tongue -signs of infection - fever or chills, cough, sore throat, pain or difficulty passing urine -signs of decreased platelets or bleeding - bruising, pinpoint red spots on the skin, black, tarry stools, nosebleeds -signs of decreased red blood cells - unusually weak or tired, fainting spells, lightheadedness -breathing problems -changes in hearing -changes in vision -chest pain -high blood pressure -low blood counts - This drug may decrease the number of white blood cells, red blood cells and platelets. You may be at increased risk for infections and bleeding. -nausea and vomiting -pain, swelling, redness or irritation at the injection site -pain, tingling, numbness in the hands or feet -problems with balance, talking, walking -trouble passing urine or change in the amount of urine Side effects that usually do not require medical attention (report to your doctor or health care professional if they continue or are bothersome): -hair loss -loss of appetite -metallic taste in the mouth or changes in taste This list may not describe all possible side effects. Call your doctor for medical advice about side effects. You may report side effects to FDA at 1-800-FDA-1088. Where should I keep my medicine? This drug is given in a hospital or clinic and will not be stored at home. NOTE: This sheet is a summary. It may not cover all  possible information. If you have questions about this medicine, talk to your doctor, pharmacist, or health care provider.  2019 Elsevier/Gold Standard (2007-10-17 14:38:05)       

## 2018-10-23 ENCOUNTER — Encounter: Payer: BLUE CROSS/BLUE SHIELD | Admitting: Nurse Practitioner

## 2018-10-23 ENCOUNTER — Inpatient Hospital Stay: Payer: BLUE CROSS/BLUE SHIELD

## 2018-10-23 ENCOUNTER — Other Ambulatory Visit: Payer: Self-pay

## 2018-10-23 VITALS — BP 131/88 | HR 82 | Resp 18

## 2018-10-23 DIAGNOSIS — C50412 Malignant neoplasm of upper-outer quadrant of left female breast: Secondary | ICD-10-CM

## 2018-10-23 DIAGNOSIS — Z170 Estrogen receptor positive status [ER+]: Secondary | ICD-10-CM

## 2018-10-23 DIAGNOSIS — C50912 Malignant neoplasm of unspecified site of left female breast: Secondary | ICD-10-CM

## 2018-10-23 MED ORDER — PEGFILGRASTIM-CBQV 6 MG/0.6ML ~~LOC~~ SOSY
6.0000 mg | PREFILLED_SYRINGE | Freq: Once | SUBCUTANEOUS | Status: AC
  Administered 2018-10-23: 6 mg via SUBCUTANEOUS

## 2018-10-23 MED ORDER — PEGFILGRASTIM-CBQV 6 MG/0.6ML ~~LOC~~ SOSY
PREFILLED_SYRINGE | SUBCUTANEOUS | Status: AC
  Filled 2018-10-23: qty 0.6

## 2018-10-23 NOTE — Patient Instructions (Signed)
Pegfilgrastim injection  What is this medicine?  PEGFILGRASTIM (PEG fil gra stim) is a long-acting granulocyte colony-stimulating factor that stimulates the growth of neutrophils, a type of white blood cell important in the body's fight against infection. It is used to reduce the incidence of fever and infection in patients with certain types of cancer who are receiving chemotherapy that affects the bone marrow, and to increase survival after being exposed to high doses of radiation.  This medicine may be used for other purposes; ask your health care provider or pharmacist if you have questions.  COMMON BRAND NAME(S): Fulphila, Neulasta, UDENYCA  What should I tell my health care provider before I take this medicine?  They need to know if you have any of these conditions:  -kidney disease  -latex allergy  -ongoing radiation therapy  -sickle cell disease  -skin reactions to acrylic adhesives (On-Body Injector only)  -an unusual or allergic reaction to pegfilgrastim, filgrastim, other medicines, foods, dyes, or preservatives  -pregnant or trying to get pregnant  -breast-feeding  How should I use this medicine?  This medicine is for injection under the skin. If you get this medicine at home, you will be taught how to prepare and give the pre-filled syringe or how to use the On-body Injector. Refer to the patient Instructions for Use for detailed instructions. Use exactly as directed. Tell your healthcare provider immediately if you suspect that the On-body Injector may not have performed as intended or if you suspect the use of the On-body Injector resulted in a missed or partial dose.  It is important that you put your used needles and syringes in a special sharps container. Do not put them in a trash can. If you do not have a sharps container, call your pharmacist or healthcare provider to get one.  Talk to your pediatrician regarding the use of this medicine in children. While this drug may be prescribed for  selected conditions, precautions do apply.  Overdosage: If you think you have taken too much of this medicine contact a poison control center or emergency room at once.  NOTE: This medicine is only for you. Do not share this medicine with others.  What if I miss a dose?  It is important not to miss your dose. Call your doctor or health care professional if you miss your dose. If you miss a dose due to an On-body Injector failure or leakage, a new dose should be administered as soon as possible using a single prefilled syringe for manual use.  What may interact with this medicine?  Interactions have not been studied.  Give your health care provider a list of all the medicines, herbs, non-prescription drugs, or dietary supplements you use. Also tell them if you smoke, drink alcohol, or use illegal drugs. Some items may interact with your medicine.  This list may not describe all possible interactions. Give your health care provider a list of all the medicines, herbs, non-prescription drugs, or dietary supplements you use. Also tell them if you smoke, drink alcohol, or use illegal drugs. Some items may interact with your medicine.  What should I watch for while using this medicine?  You may need blood work done while you are taking this medicine.  If you are going to need a MRI, CT scan, or other procedure, tell your doctor that you are using this medicine (On-Body Injector only).  What side effects may I notice from receiving this medicine?  Side effects that you should report to   your doctor or health care professional as soon as possible:  -allergic reactions like skin rash, itching or hives, swelling of the face, lips, or tongue  -back pain  -dizziness  -fever  -pain, redness, or irritation at site where injected  -pinpoint red spots on the skin  -red or dark-brown urine  -shortness of breath or breathing problems  -stomach or side pain, or pain at the shoulder  -swelling  -tiredness  -trouble passing urine or  change in the amount of urine  Side effects that usually do not require medical attention (report to your doctor or health care professional if they continue or are bothersome):  -bone pain  -muscle pain  This list may not describe all possible side effects. Call your doctor for medical advice about side effects. You may report side effects to FDA at 1-800-FDA-1088.  Where should I keep my medicine?  Keep out of the reach of children.  If you are using this medicine at home, you will be instructed on how to store it. Throw away any unused medicine after the expiration date on the label.  NOTE: This sheet is a summary. It may not cover all possible information. If you have questions about this medicine, talk to your doctor, pharmacist, or health care provider.   2019 Elsevier/Gold Standard (2017-10-17 16:57:08)

## 2018-10-26 ENCOUNTER — Other Ambulatory Visit: Payer: Self-pay

## 2018-10-26 ENCOUNTER — Inpatient Hospital Stay: Payer: BLUE CROSS/BLUE SHIELD | Attending: Oncology

## 2018-10-26 ENCOUNTER — Encounter: Payer: Self-pay | Admitting: Adult Health

## 2018-10-26 ENCOUNTER — Inpatient Hospital Stay (HOSPITAL_BASED_OUTPATIENT_CLINIC_OR_DEPARTMENT_OTHER): Payer: BLUE CROSS/BLUE SHIELD | Admitting: Adult Health

## 2018-10-26 VITALS — BP 114/82 | HR 89 | Temp 98.6°F | Resp 18 | Ht 62.5 in | Wt 160.3 lb

## 2018-10-26 DIAGNOSIS — Z5111 Encounter for antineoplastic chemotherapy: Secondary | ICD-10-CM | POA: Diagnosis not present

## 2018-10-26 DIAGNOSIS — Z853 Personal history of malignant neoplasm of breast: Secondary | ICD-10-CM | POA: Diagnosis not present

## 2018-10-26 DIAGNOSIS — Z7689 Persons encountering health services in other specified circumstances: Secondary | ICD-10-CM | POA: Diagnosis not present

## 2018-10-26 DIAGNOSIS — Z170 Estrogen receptor positive status [ER+]: Secondary | ICD-10-CM | POA: Diagnosis not present

## 2018-10-26 DIAGNOSIS — Z79899 Other long term (current) drug therapy: Secondary | ICD-10-CM

## 2018-10-26 DIAGNOSIS — C50912 Malignant neoplasm of unspecified site of left female breast: Secondary | ICD-10-CM

## 2018-10-26 DIAGNOSIS — Z9012 Acquired absence of left breast and nipple: Secondary | ICD-10-CM

## 2018-10-26 DIAGNOSIS — C50412 Malignant neoplasm of upper-outer quadrant of left female breast: Secondary | ICD-10-CM

## 2018-10-26 LAB — CBC WITH DIFFERENTIAL/PLATELET
Abs Immature Granulocytes: 0.07 10*3/uL (ref 0.00–0.07)
Basophils Absolute: 0 10*3/uL (ref 0.0–0.1)
Basophils Relative: 1 %
Eosinophils Absolute: 0.3 10*3/uL (ref 0.0–0.5)
Eosinophils Relative: 9 %
HCT: 31.5 % — ABNORMAL LOW (ref 36.0–46.0)
Hemoglobin: 10.3 g/dL — ABNORMAL LOW (ref 12.0–15.0)
Immature Granulocytes: 2 %
Lymphocytes Relative: 56 %
Lymphs Abs: 2.1 10*3/uL (ref 0.7–4.0)
MCH: 31.2 pg (ref 26.0–34.0)
MCHC: 32.7 g/dL (ref 30.0–36.0)
MCV: 95.5 fL (ref 80.0–100.0)
Monocytes Absolute: 0.1 10*3/uL (ref 0.1–1.0)
Monocytes Relative: 3 %
Neutro Abs: 1.1 10*3/uL — ABNORMAL LOW (ref 1.7–7.7)
Neutrophils Relative %: 29 %
Platelets: 196 10*3/uL (ref 150–400)
RBC: 3.3 MIL/uL — ABNORMAL LOW (ref 3.87–5.11)
RDW: 12.8 % (ref 11.5–15.5)
WBC: 3.7 10*3/uL — ABNORMAL LOW (ref 4.0–10.5)
nRBC: 0 % (ref 0.0–0.2)

## 2018-10-26 LAB — COMPREHENSIVE METABOLIC PANEL
ALT: 17 U/L (ref 0–44)
AST: 15 U/L (ref 15–41)
Albumin: 3.9 g/dL (ref 3.5–5.0)
Alkaline Phosphatase: 93 U/L (ref 38–126)
Anion gap: 9 (ref 5–15)
BUN: 10 mg/dL (ref 6–20)
CO2: 27 mmol/L (ref 22–32)
Calcium: 8.6 mg/dL — ABNORMAL LOW (ref 8.9–10.3)
Chloride: 100 mmol/L (ref 98–111)
Creatinine, Ser: 0.64 mg/dL (ref 0.44–1.00)
GFR calc Af Amer: >60 mL/min (ref >60)
GFR calc non Af Amer: >60 mL/min (ref >60)
Glucose, Bld: 89 mg/dL (ref 70–99)
Potassium: 3.7 mmol/L (ref 3.5–5.1)
Sodium: 136 mmol/L (ref 135–145)
Total Bilirubin: 0.5 mg/dL (ref 0.3–1.2)
Total Protein: 7 g/dL (ref 6.5–8.1)

## 2018-10-26 NOTE — Progress Notes (Signed)
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 At that time she underwent a left mastectomy for stage 0 (noninvasive) breast cancer, with transverse rectus abdominis (TRAM) flap construction under Dr. Towanda Malkin. She also underwent a right breast reduction. She took tamoxifen for three years.  More recently she underwent bilateral diagnostic mammography with tomography and left breast ultrasonography at [facility] on 01/03/2018 showing: Breast Density Category B. There is an oval fat containing lesion in the left breast upper outer quadrant posterior depth. No other significant masses, calcifications, or other findings are seen in either breast. Sonographically, there is a 1.5 cm lesion in the left breast upper outer quadrant posterior depth. This lesion is of mixed echogenicity. This correlates as palpated and with mammography findings. Follow up was recommended.  Close follow-up was suggested.  She then presented with a non-tender mass in the left reconstructed breast on 08/30/2018. On physical exam, there is a hard palpable lump measuring 2.0 cm in the upper outer left reconstructed breast 10 cm from the expected location of a nipple. Sonography over this area demonstrates a 1.8 cm x 1.7 cm x 1.4 cm mass in the left breast at 2 o'clock posterior depth 10 cm from the nipple. This  mass is of mixed echogenicity. This abnormality is increased in size and correlates as palpated and with prior mammography findings. Color flow imaging demonstrates that there is vascularity present. Elastography imaging assessment is intermediate. No significant abnormalities were seen sonographically in the left axilla.    Accordingly on 08/30/2018 she proceeded to biopsy of the left breast mass in question. The pathology from this procedure showed ([id number]): invasive ductal carcinoma, grade III. Prognostic indicators significant for: estrogen receptor, 100% positive with strong staining intensity and progesterone receptor, 0% negative. Proliferation marker Ki67 at 15%. HER2 positive (3+) by immunohistochemistry.  The patient's subsequent history is as detailed below.   INTERVAL HISTORY: Paige Foster returns today for follow-up and treatment of her estrogen and HER-2 positive breast cancer.   She began neoadjuvant chemotherapy will consist of carboplatin, docetaxel, trastuzumab and Pertuzumab starting 09/26/2018, repeated every 21 days x 6. With Udenyca support on day 3.  Today is cycle 2 day 7 of treatment.    REVIEW OF SYSTEMS: Paige Foster is feeling well today.  She is fatigued.  She continues to work, and notes that she will not be taking a leave of absence due to chemotherapy or the coronavirus.  She says other than feeling tired, she tolerated chemotherapy well.  She denies fevers, chills, cough, shortness of breath, chest pain or palpitations.  She denies peripheral neuropathy.  She hasn't had any diarrhea, dysuria, nausea, vomiting.  A detailed ROS was otherwise non contributory.    PAST MEDICAL HISTORY: Past Medical History:  Diagnosis Date   History of colon polyps    Hypertension    Recurrent breast cancer, left [facility]) oncologist-- dr Jana Hakim    dx 2004, noninvasive Stage 0 ----s/p  left mastectomy w/ tram flap construction (and right breast reduction), taken Tamoxifen for 3 yrs;    08-30-2018 recurrent left cancer , Grade III,  cT1c,  ER positive, PR negative, HER-2 positive, invasive ductal carcinoma-- neoadjuvant chemo to start 09-26-2018   Renal artery stenosis (HCC)    mild right external renal artery stenosis per duplex in epic 08-09-2013   Wears glasses    Caesarian    PAST SURGICAL HISTORY: Past Surgical History:  Procedure Laterality Date   COLONOSCOPY     MASTECTOMY Left 2004   w/  TRAM flap construction and right breast augmentation with abdominoplasy   PORTACATH PLACEMENT N/A 09/25/2018   Procedure: INSERTION PORT-A-CATH WITH ULTRASOUND;  Surgeon: Rolm Bookbinder, MD;  Location: [facility];  Service: General;  Laterality: N/A;   TUBAL LIGATION Bilateral yrs ago     FAMILY HISTORY: Family History  Problem Relation Age of Onset   Diabetes Mother    Hypertension Mother    Kidney disease Father    Colon cancer Neg Hx    Colon polyps Neg Hx    Gallbladder disease Neg Hx    Heart disease Neg Hx    Esophageal cancer Neg Hx    Paige Foster's father died from unknown causes in his early 28's. Patients' mother died from diabetes complications at age 10. The patient has 1 sister. Patient denies anyone in her family having breast, ovarian, prostate, or pancreatic cancer.    GYNECOLOGIC HISTORY:  Patient's last menstrual period was 06/08/2007. 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 Her husband, Paige Foster, works at [employer]. Yaret has two children, Paige Foster and Paige Foster. Paige Foster lives with her, is 55, and it attending [employer] for a computer based degree. Paige Foster lives with her, is 5, and recently graduated from [employer] with a degree in Careers information officer. Tamalyn has no grandchildren. She attends the [employer].   ADVANCED DIRECTIVES: Her husband, Paige Foster, is  automatically her healthcare power of attorney     HEALTH MAINTENANCE: Social History   Tobacco Use   Smoking status: Never Smoker   Smokeless tobacco: Never Used  Substance Use Topics   Alcohol use: Not Currently    Alcohol/week: 0.0 standard drinks    Comment: Occassionally   Drug use: No    Colonoscopy: yes  PAP:   Bone density: yes, 2017; -1.6, osteopenic   No Known Allergies  Current Outpatient Medications  Medication Sig Dispense Refill   amLODipine (NORVASC) 10 MG tablet Take 1 tablet (10 mg total) by mouth at bedtime. 90 tablet 1   dexamethasone (DECADRON) 4 MG tablet Take 2 tablets (8 mg total) by mouth 2 (two) times daily. Start the day before Taxotere. Take once the day after, then 2 times a day x 2d. 30 tablet 1   Flaxseed, Linseed, (FLAXSEED OIL) 1000 MG CAPS Take 1,000 mg by mouth daily.     lidocaine-prilocaine (EMLA) cream Apply to affected area once 30 g 3   LORazepam (ATIVAN) 0.5 MG tablet Take 1 tablet (0.5 mg total) by mouth at bedtime as needed (Nausea or vomiting). 30 tablet 0   losartan (COZAAR) 50 MG tablet Take 1 tablet (50 mg total) by mouth every morning. 90 tablet 1   Nutritional Supplements (GRAPESEED EXTRACT PO) Take 1 capsule by mouth daily.  prochlorperazine (COMPAZINE) 10 MG tablet Take 1 tablet (10 mg total) by mouth every 6 (six) hours as needed (Nausea or vomiting). 30 tablet 1   Resveratrol 250 MG CAPS Take 250 mg by mouth daily.     traMADol (ULTRAM) 50 MG tablet Take 2 tablets (100 mg total) by mouth every 6 (six) hours as needed. 8 tablet 0   TURMERIC PO Take 1,000 mg by mouth daily.     No current facility-administered medications for this visit.      OBJECTIVE:   Vitals:   10/26/18 1511  BP: 114/82  Pulse: 89  Resp: 18  Temp: 98.6 F (37 C)  SpO2: 100%     Body mass index is 28.85 kg/m.   Wt Readings from Last 3 Encounters:  10/26/18 160 lb 4.8 oz (72.7 kg)  10/19/18 161 lb 1.6 oz (73.1 kg)  09/19/18 163  lb 8 oz (74.2 kg)  ECOG FS:1 GENERAL: Patient is a well appearing female in no acute distress HEENT:  Sclerae anicteric.  Oropharynx clear and moist. No ulcerations or evidence of oropharyngeal candidiasis. Neck is supple.  NODES:  No cervical, supraclavicular, or axillary lymphadenopathy palpated.  BREAST EXAM:  deferred LUNGS:  Clear to auscultation bilaterally.  No wheezes or rhonchi. HEART:  Regular rate and rhythm. No murmur appreciated. ABDOMEN:  Soft, nontender.  Positive, normoactive bowel sounds. No organomegaly palpated. MSK:  No focal spinal tenderness to palpation. Full range of motion bilaterally in the upper extremities. EXTREMITIES:  No peripheral edema.   SKIN:  Clear with no obvious rashes or skin changes. No nail dyscrasia. NEURO:  Nonfocal. Well oriented.  Appropriate affect.    LAB RESULTS:  CMP     Component Value Date/Time   NA 138 10/19/2018 1359   NA 140 10/12/2017 0950   K 3.6 10/19/2018 1359   CL 103 10/19/2018 1359   CO2 25 10/19/2018 1359   GLUCOSE 90 10/19/2018 1359   BUN 13 10/19/2018 1359   BUN 10 10/12/2017 0950   CREATININE 0.69 10/19/2018 1359   CREATININE 0.78 09/07/2018 1455   CALCIUM 9.6 10/19/2018 1359   PROT 7.8 10/19/2018 1359   PROT 7.5 10/12/2017 0950   ALBUMIN 4.1 10/19/2018 1359   ALBUMIN 4.3 10/12/2017 0950   AST 15 10/19/2018 1359   AST 14 (L) 09/07/2018 1455   ALT 18 10/19/2018 1359   ALT 13 09/07/2018 1455   ALKPHOS 108 10/19/2018 1359   BILITOT 0.3 10/19/2018 1359   BILITOT 0.2 (L) 09/07/2018 1455   GFRNONAA >60 10/19/2018 1359   GFRNONAA >60 09/07/2018 1455   GFRAA >60 10/19/2018 1359   GFRAA >60 09/07/2018 1455    No results found for: TOTALPROTELP, ALBUMINELP, A1GS, A2GS, BETS, BETA2SER, GAMS, MSPIKE, SPEI  No results found for: KPAFRELGTCHN, LAMBDASER, KAPLAMBRATIO  Lab Results  Component Value Date   WBC 3.7 (L) 10/26/2018   NEUTROABS PENDING 10/26/2018   HGB 10.3 (L) 10/26/2018   HCT 31.5 (L) 10/26/2018     MCV 95.5 10/26/2018   PLT 196 10/26/2018    Lab Results  Component Value Date   LABCA2 <4 08/30/2007    No components found for: UTMLYY503  No results for input(s): INR in the last 168 hours.  Lab Results  Component Value Date   LABCA2 <4 08/30/2007    No results found for: TWS568  No results found for: LEX517  No results found for: GYF749  No results found for: CA2729  No components found for: HGQUANT  No  results found for: CEA1 / No results found for: CEA1   No results found for: AFPTUMOR  No results found for: [place]  No results found for: PSA1  Appointment on 10/26/2018  Component Date Value Ref Range Status   WBC 10/26/2018 3.7* 4.0 - 10.5 K/uL Final   RBC 10/26/2018 3.30* 3.87 - 5.11 MIL/uL Final   Hemoglobin 10/26/2018 10.3* 12.0 - 15.0 g/dL Final   HCT 10/26/2018 31.5* 36.0 - 46.0 % Final   MCV 10/26/2018 95.5  80.0 - 100.0 fL Final   MCH 10/26/2018 31.2  26.0 - 34.0 pg Final   MCHC 10/26/2018 32.7  30.0 - 36.0 g/dL Final   RDW 10/26/2018 12.8  11.5 - 15.5 % Final   Platelets 10/26/2018 196  150 - 400 K/uL Final   nRBC 10/26/2018 0.0  0.0 - 0.2 % Final   Performed at [facility], [place] [address] [place], [place] [address]   Neutrophils Relative % 10/26/2018 PENDING  % Incomplete   Neutro Abs 10/26/2018 PENDING  1.7 - 7.7 K/uL Incomplete   Band Neutrophils 10/26/2018 PENDING  % Incomplete   Lymphocytes Relative 10/26/2018 PENDING  % Incomplete   Lymphs Abs 10/26/2018 PENDING  0.7 - 4.0 K/uL Incomplete   Monocytes Relative 10/26/2018 PENDING  % Incomplete   Monocytes Absolute 10/26/2018 PENDING  0.1 - 1.0 K/uL Incomplete   Eosinophils Relative 10/26/2018 PENDING  % Incomplete   Eosinophils Absolute 10/26/2018 PENDING  0.0 - 0.5 K/uL Incomplete   Basophils Relative 10/26/2018 PENDING  % Incomplete   Basophils Absolute 10/26/2018 PENDING  0.0 - 0.1 K/uL Incomplete   WBC Morphology 10/26/2018 PENDING    Incomplete   RBC Morphology 10/26/2018 PENDING   Incomplete   Smear Review 10/26/2018 PENDING   Incomplete   Other 10/26/2018 PENDING  % Incomplete   nRBC 10/26/2018 PENDING  0 /100 WBC Incomplete   Metamyelocytes Relative 10/26/2018 PENDING  % Incomplete   Myelocytes 10/26/2018 PENDING  % Incomplete   Promyelocytes Relative 10/26/2018 PENDING  % Incomplete   Blasts 10/26/2018 PENDING  % Incomplete    (this displays the last labs from the last 3 days)  No results found for: TOTALPROTELP, ALBUMINELP, A1GS, A2GS, BETS, BETA2SER, GAMS, MSPIKE, SPEI (this displays SPEP labs)  No results found for: KPAFRELGTCHN, LAMBDASER, KAPLAMBRATIO (kappa/lambda light chains)  No results found for: HGBA, HGBA2QUANT, HGBFQUANT, HGBSQUAN (Hemoglobinopathy evaluation)   Lab Results  Component Value Date   LDH 169 08/30/2007    Lab Results  Component Value Date   IRON 78 01/01/2009   TIBC 424 08/30/2007   IRONPCTSAT 17.9 (L) 01/01/2009   (Iron and TIBC)  Lab Results  Component Value Date   FERRITIN 19 08/30/2007    Urinalysis    Component Value Date/Time   LABSPEC 1.015 04/09/2008 1548   PHURINE 7.0 04/09/2008 1548   HGBUR large 04/09/2008 1548   BILIRUBINUR negative 04/09/2008 1548   UROBILINOGEN 0.2 04/09/2008 1548   NITRITE negative 04/09/2008 1548     STUDIES:  No results found.  ELIGIBLE FOR AVAILABLE RESEARCH PROTOCOL:    ASSESSMENT: 53 y.o. [place], [place] woman  (1) history of left-sided ductal carcinoma in situ 2004  (a) s/pleft mastectomy with TRAM reconstruction  (b) status post tamoxifen x3 years  (2) left breast upper outer quadrant biopsy 08/30/2018 shows a clinical T1c N0 invasive ductal carcinoma, grade 3, estrogen receptor positive, progesterone receptor negative, with no HER-2 amplification, and and MIB-1 of 15%.   (3) neoadjuvant chemotherapy will consist  of carboplatin, docetaxel, trastuzumab and Pertuzumab starting 09/26/2018, repeated every 21  days x 6.  (4) definitive surgery to follow  (5) adjuvant radiation therapy as appropriate  (6) antiestrogens to follow at the completion of local treatment   PLAN: Fredricka is doing moderately well today.  Her CBC though not completely back notes her wbc is 3.7.  It is unlikely that she will be neutropenic.  She tolerated her chemotherapy well.    I reviewed her fatigue and recommended activity conservation alternating with short periods of exercise.  I again recommended that she not work due to the [place] pandemic and the fact that she is undergoing chemotherapy and cannot really social distance at work.  She understands our recommendations and understands that she is at increased risk for complications should she contract the virus.    Brisha will return in 2 weeks for labs, f/u and cycle 3 of Docetaxel, carboplatin, trastuzumab, pertuzumab.  She knows to call for any questions or concerns prior to her next appointment with [place].    A total of (20) minutes of face-to-ny other issues that may develop before her next visit.face time was spent with this patient with greater than 50% of that time in counseling and care-coordination.  Wilber Bihari, NP 10/26/18 3:20 PM Medical Oncology and Hematology [facility] [address] [place], [place] [address] Tel. [phone]    Fax. [phone]

## 2018-10-30 ENCOUNTER — Telehealth: Payer: Self-pay | Admitting: Adult Health

## 2018-10-30 NOTE — Telephone Encounter (Signed)
Tried to call regarding 4/16 the mailbox was full

## 2018-11-08 ENCOUNTER — Encounter: Payer: Self-pay | Admitting: General Practice

## 2018-11-08 NOTE — Progress Notes (Signed)
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 At that time she underwent a left mastectomy for stage 0 (noninvasive) breast cancer, with transverse rectus abdominis (TRAM) flap construction under Dr. Towanda Malkin. She also underwent a right breast reduction. She took tamoxifen for three years.  More recently she underwent bilateral diagnostic mammography with tomography and left breast ultrasonography at [facility] on 01/03/2018 showing: Breast Density Category B. There is an oval fat containing lesion in the left breast upper outer quadrant posterior depth. No other significant masses, calcifications, or other findings are seen in either breast. Sonographically, there is a 1.5 cm lesion in the left breast upper outer quadrant posterior depth. This lesion is of mixed echogenicity. This correlates as palpated and with mammography findings. Follow up was recommended.  Close follow-up was suggested.  She then presented with a non-tender mass in the left reconstructed breast on 08/30/2018. On physical exam, there is a hard palpable lump measuring 2.0 cm in the upper outer left reconstructed breast 10 cm from the expected location of a nipple. Sonography over this area demonstrates a 1.8 cm x 1.7 cm x 1.4 cm mass in the left breast at 2 o'clock posterior depth 10 cm from the nipple. This  mass is of mixed echogenicity. This abnormality is increased in size and correlates as palpated and with prior mammography findings. Color flow imaging demonstrates that there is vascularity present. Elastography imaging assessment is intermediate. No significant abnormalities were seen sonographically in the left axilla.    Accordingly on 08/30/2018 she proceeded to biopsy of the left breast mass in question. The pathology from this procedure showed ([id number]): invasive ductal carcinoma, grade III. Prognostic indicators significant for: estrogen receptor, 100% positive with strong staining intensity and progesterone receptor, 0% negative. Proliferation marker Ki67 at 15%. HER2 positive (3+) by immunohistochemistry.  The patient's subsequent history is as detailed below.   INTERVAL HISTORY: Paige Foster returns today for follow-up and treatment of her estrogen and HER-2 positive breast cancer.   She began neoadjuvant chemotherapy will consist of carboplatin, docetaxel, trastuzumab and Pertuzumab starting 09/26/2018, repeated every 21 days x 6. With Udenyca support on day 3.  Today is cycle 3 day 1 of treatment.    REVIEW OF SYSTEMS: Paige Foster is feeling well today.  She has continued to work throughout her treatment.  She is doing well with this.  She is wearing a mask and frequently handwashing.  She notes she is unable to work from home with her job, and that she cannot stay out of work because she needs the money to be able to pay her bills.  She was unhappy that her appointment was rescheduled yesterday for her to receive chemotherapy today instead of tomorrow.  She requested I reach out to the person over this to see what we can do to make sure this doesn't happen again.    Paige Foster denies any fever, chills, bowel/bladder changes.  She is without  peripheral neuropathy.  She hasn't had any nausea, vomiting, chest pain, cough, or shortness of breath.  A detailed ROS Was otherwise non contributory.    PAST  MEDICAL HISTORY: Past Medical History:  Diagnosis Date  . History of colon polyps   . Hypertension   . Recurrent breast cancer, left [facility]) oncologist-- dr Jana Hakim    dx 2004, noninvasive Stage 0 ----s/p left mastectomy w/ tram flap construction (and right breast reduction), taken Tamoxifen for 3 yrs;   08-30-2018 recurrent left cancer , Grade III,  cT1c,  ER positive, PR negative, HER-2 positive, invasive ductal carcinoma-- neoadjuvant chemo to start 09-26-2018  . Renal artery stenosis (HCC)    mild right external renal artery stenosis per duplex in epic 08-09-2013  . Wears glasses    Caesarian    PAST SURGICAL HISTORY: Past Surgical History:  Procedure Laterality Date  . COLONOSCOPY    . MASTECTOMY Left 2004   w/  TRAM flap construction and right breast augmentation with abdominoplasy  . PORTACATH PLACEMENT N/A 09/25/2018   Procedure: INSERTION PORT-A-CATH WITH ULTRASOUND;  Surgeon: Paige Bookbinder, Foster;  Location: [facility];  Service: General;  Laterality: N/A;  . TUBAL LIGATION Bilateral yrs ago     FAMILY HISTORY: Family History  Problem Relation Age of Onset  . Diabetes Mother   . Hypertension Mother   . Kidney disease Father   . Colon cancer Neg Hx   . Colon polyps Neg Hx   . Gallbladder disease Neg Hx   . Heart disease Neg Hx   . Esophageal cancer Neg Hx    Paige Foster's father died from unknown causes in his early 59's. Patients' mother died from diabetes complications at age 57. The patient has 1 sister. Patient denies anyone in her family having breast, ovarian, prostate, or pancreatic cancer.    GYNECOLOGIC HISTORY:  Patient's last menstrual period was 06/08/2007. 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 Her husband, Paige Foster, works at [employer]. Paige Foster has two children,  Paige Foster and Paige Foster. Paige Foster lives with her, is 10, and it attending [employer] for a computer based degree. Paige Foster lives with her, is 61, and recently graduated from [employer] with a degree in Careers information officer. Shunte has no grandchildren. She attends the [employer].   ADVANCED DIRECTIVES: Her husband, Paige Foster, is automatically her healthcare power of attorney     HEALTH MAINTENANCE: Social History   Tobacco Use  . Smoking status: Never Smoker  . Smokeless tobacco: Never Used  Substance Use Topics  . Alcohol use: Not Currently    Alcohol/week: 0.0 standard drinks    Comment: Occassionally  . Drug use: No    Colonoscopy: yes  PAP:   Bone density: yes, 2017; -1.6, osteopenic   No Known Allergies  Current Outpatient Medications  Medication Sig Dispense Refill  . amLODipine (NORVASC) 10 MG tablet Take 1 tablet (10 mg total) by mouth at bedtime. 90 tablet 1  . dexamethasone (DECADRON) 4 MG tablet Take 2 tablets (8 mg total) by mouth 2 (two) times daily. Start the day before Taxotere. Take once the day after, then 2 times a day x 2d. 30 tablet 1  . Flaxseed, Linseed, (FLAXSEED OIL) 1000 MG CAPS Take 1,000 mg by mouth daily.    Marland Kitchen lidocaine-prilocaine (EMLA) cream Apply to  affected area once 30 g 3  . LORazepam (ATIVAN) 0.5 MG tablet Take 1 tablet (0.5 mg total) by mouth at bedtime as needed (Nausea or vomiting). 30 tablet 0  . losartan (COZAAR) 50 MG tablet Take 1 tablet (50 mg total) by mouth every morning. 90 tablet 1  . Nutritional Supplements (GRAPESEED EXTRACT PO) Take 1 capsule by mouth daily.    . prochlorperazine (COMPAZINE) 10 MG tablet Take 1 tablet (10 mg total) by mouth every 6 (six) hours as needed (Nausea or vomiting). 30 tablet 1  . Resveratrol 250 MG CAPS Take 250 mg by mouth daily.    . traMADol (ULTRAM) 50 MG tablet Take 2 tablets (100 mg total) by mouth every 6 (six) hours as needed. 8 tablet 0  . TURMERIC PO Take 1,000 mg by mouth daily.     No current facility-administered  medications for this visit.      OBJECTIVE:   Vitals:   11/09/18 0914  BP: 109/69  Pulse: 68  Resp: 18  Temp: 99.1 F (37.3 C)  SpO2: 100%     Body mass index is 28.55 kg/m.   Wt Readings from Last 3 Encounters:  11/09/18 158 lb 9.6 oz (71.9 kg)  10/26/18 160 lb 4.8 oz (72.7 kg)  10/19/18 161 lb 1.6 oz (73.1 kg)  ECOG FS:1 GENERAL: Patient is a well appearing female in no acute distress HEENT:  Sclerae anicteric.  Oropharynx clear and moist. No ulcerations or evidence of oropharyngeal candidiasis. Neck is supple.  NODES:  No cervical, supraclavicular, or axillary lymphadenopathy palpated.  BREAST EXAM:  1-2cm nodule in left lateral breast palpated LUNGS:  Clear to auscultation bilaterally.  No wheezes or rhonchi. HEART:  Regular rate and rhythm. No murmur appreciated. ABDOMEN:  Soft, nontender.  Positive, normoactive bowel sounds. No organomegaly palpated. MSK:  No focal spinal tenderness to palpation. Full range of motion bilaterally in the upper extremities. EXTREMITIES:  No peripheral edema.   SKIN:  Clear with no obvious rashes or skin changes. No nail dyscrasia. NEURO:  Nonfocal. Well oriented.  Appropriate affect.    LAB RESULTS:  CMP     Component Value Date/Time   NA 136 10/26/2018 1503   NA 140 10/12/2017 0950   K 3.7 10/26/2018 1503   CL 100 10/26/2018 1503   CO2 27 10/26/2018 1503   GLUCOSE 89 10/26/2018 1503   BUN 10 10/26/2018 1503   BUN 10 10/12/2017 0950   CREATININE 0.64 10/26/2018 1503   CREATININE 0.78 09/07/2018 1455   CALCIUM 8.6 (L) 10/26/2018 1503   PROT 7.0 10/26/2018 1503   PROT 7.5 10/12/2017 0950   ALBUMIN 3.9 10/26/2018 1503   ALBUMIN 4.3 10/12/2017 0950   AST 15 10/26/2018 1503   AST 14 (L) 09/07/2018 1455   ALT 17 10/26/2018 1503   ALT 13 09/07/2018 1455   ALKPHOS 93 10/26/2018 1503   BILITOT 0.5 10/26/2018 1503   BILITOT 0.2 (L) 09/07/2018 1455   GFRNONAA >60 10/26/2018 1503   GFRNONAA >60 09/07/2018 1455   GFRAA >60  10/26/2018 1503   GFRAA >60 09/07/2018 1455    No results found for: TOTALPROTELP, ALBUMINELP, A1GS, A2GS, BETS, BETA2SER, GAMS, MSPIKE, SPEI  No results found for: KPAFRELGTCHN, LAMBDASER, KAPLAMBRATIO  Lab Results  Component Value Date   WBC 3.7 (L) 10/26/2018   NEUTROABS 1.1 (L) 10/26/2018   HGB 10.3 (L) 10/26/2018   HCT 31.5 (L) 10/26/2018   MCV 95.5 10/26/2018   PLT 196 10/26/2018    Lab  Results  Component Value Date   LABCA2 <4 08/30/2007    No components found for: [id number]  No results for input(s): INR in the last 168 hours.  Lab Results  Component Value Date   LABCA2 <4 08/30/2007    No results found for: [id number]  No results found for: GNF621  No results found for: HYQ657  No results found for: CA2729  No components found for: HGQUANT  No results found for: CEA1 / No results found for: CEA1   No results found for: AFPTUMOR  No results found for: [place]  No results found for: PSA1  No visits with results within 3 Day(s) from this visit.  Latest known visit with results is:  Appointment on 10/26/2018  Component Date Value Ref Range Status  . Sodium 10/26/2018 136  135 - 145 mmol/L Final  . Potassium 10/26/2018 3.7  3.5 - 5.1 mmol/L Final  . Chloride 10/26/2018 100  98 - 111 mmol/L Final  . CO2 10/26/2018 27  22 - 32 mmol/L Final  . Glucose, Bld 10/26/2018 89  70 - 99 mg/dL Final  . BUN 10/26/2018 10  6 - 20 mg/dL Final  . Creatinine, Ser 10/26/2018 0.64  0.44 - 1.00 mg/dL Final  . Calcium 10/26/2018 8.6* 8.9 - 10.3 mg/dL Final  . Total Protein 10/26/2018 7.0  6.5 - 8.1 g/dL Final  . Albumin 10/26/2018 3.9  3.5 - 5.0 g/dL Final  . AST 10/26/2018 15  15 - 41 U/L Final  . ALT 10/26/2018 17  0 - 44 U/L Final  . Alkaline Phosphatase 10/26/2018 93  38 - 126 U/L Final  . Total Bilirubin 10/26/2018 0.5  0.3 - 1.2 mg/dL Final  . GFR calc non Af Amer 10/26/2018 >60  >60 mL/min Final  . GFR calc Af Amer 10/26/2018 >60  >60 mL/min Final  . Anion  gap 10/26/2018 9  5 - 15 Final   Performed at [facility], [place] [address] [place], [place] [address]  . WBC 10/26/2018 3.7* 4.0 - 10.5 K/uL Final  . RBC 10/26/2018 3.30* 3.87 - 5.11 MIL/uL Final  . Hemoglobin 10/26/2018 10.3* 12.0 - 15.0 g/dL Final  . HCT 10/26/2018 31.5* 36.0 - 46.0 % Final  . MCV 10/26/2018 95.5  80.0 - 100.0 fL Final  . MCH 10/26/2018 31.2  26.0 - 34.0 pg Final  . MCHC 10/26/2018 32.7  30.0 - 36.0 g/dL Final  . RDW 10/26/2018 12.8  11.5 - 15.5 % Final  . Platelets 10/26/2018 196  150 - 400 K/uL Final  . nRBC 10/26/2018 0.0  0.0 - 0.2 % Final  . Neutrophils Relative % 10/26/2018 29  % Final  . Neutro Abs 10/26/2018 1.1* 1.7 - 7.7 K/uL Final  . Lymphocytes Relative 10/26/2018 56  % Final  . Lymphs Abs 10/26/2018 2.1  0.7 - 4.0 K/uL Final  . Monocytes Relative 10/26/2018 3  % Final  . Monocytes Absolute 10/26/2018 0.1  0.1 - 1.0 K/uL Final  . Eosinophils Relative 10/26/2018 9  % Final  . Eosinophils Absolute 10/26/2018 0.3  0.0 - 0.5 K/uL Final  . Basophils Relative 10/26/2018 1  % Final  . Basophils Absolute 10/26/2018 0.0  0.0 - 0.1 K/uL Final  . WBC Morphology 10/26/2018 ATYPICAL LYMPHOCYTES PRESENT   Final  . Immature Granulocytes 10/26/2018 2  % Final  . Abs Immature Granulocytes 10/26/2018 0.07  0.00 - 0.07 K/uL Final  . Smudge Cells 10/26/2018 PRESENT   Final  . Tear  Drop Cells 10/26/2018 PRESENT   Final  . Target Cells 10/26/2018 PRESENT   Final   Performed at [facility], [place] [address], [place], [place] [address]    (this displays the last labs from the last 3 days)  No results found for: TOTALPROTELP, ALBUMINELP, A1GS, A2GS, BETS, BETA2SER, GAMS, MSPIKE, SPEI (this displays SPEP labs)  No results found for: KPAFRELGTCHN, LAMBDASER, KAPLAMBRATIO (kappa/lambda light chains)  No results found for: HGBA, HGBA2QUANT, HGBFQUANT, HGBSQUAN (Hemoglobinopathy evaluation)   Lab Results  Component Value  Date   LDH 169 08/30/2007    Lab Results  Component Value Date   IRON 78 01/01/2009   TIBC 424 08/30/2007   IRONPCTSAT 17.9 (L) 01/01/2009   (Iron and TIBC)  Lab Results  Component Value Date   FERRITIN 19 08/30/2007    Urinalysis    Component Value Date/Time   LABSPEC 1.015 04/09/2008 1548   PHURINE 7.0 04/09/2008 1548   HGBUR large 04/09/2008 1548   BILIRUBINUR negative 04/09/2008 1548   UROBILINOGEN 0.2 04/09/2008 1548   NITRITE negative 04/09/2008 1548     STUDIES:  No results found.  ELIGIBLE FOR AVAILABLE RESEARCH PROTOCOL:    ASSESSMENT: 53 y.o. [place], [place] woman  (1) history of left-sided ductal carcinoma in situ 2004  (a) s/pleft mastectomy with TRAM reconstruction  (b) status post tamoxifen x3 years  (2) left breast upper outer quadrant biopsy 08/30/2018 shows a clinical T1c N0 invasive ductal carcinoma, grade 3, estrogen receptor positive, progesterone receptor negative, with no HER-2 amplification, and and MIB-1 of 15%.   (3) neoadjuvant chemotherapy will consist of carboplatin, docetaxel, trastuzumab and Pertuzumab starting 09/26/2018, repeated every 21 days x 6.  (4) definitive surgery to follow  (5) adjuvant radiation therapy as appropriate  (6) antiestrogens to follow at the completion of local treatment   PLAN: Lanai is doing well today.  Her CBC is stable.  She is tolerating her neoadjuvant chemotherapy well and will continue this.  She will proceed with her third cycle today.  I apologized to Chellsie about the last minute schedule change.  I have sent a message to our nurse managers who oversee the infusion room and scheduling/along with Dr. Jana Hakim so we can work together to avoid this from happening again.    As Camiah is tolerating chemotherapy well, we will see her on the day of or prior to her chemotherapy treatments.  She will not return the week after for lab and f/u unless she of course needs [place]. She knows to call for any questions  or concerns prior to her next appointment with [place].    A total of (20) minutes of face-to-ny other issues that may develop before her next visit.face time was spent with this patient with greater than 50% of that time in counseling and care-coordination.  Wilber Bihari, NP 11/09/18 9:15 AM Medical Oncology and Hematology [facility] [address] [place], [place] [address] Tel. [phone]    Fax. [phone]

## 2018-11-08 NOTE — Progress Notes (Signed)
[facility] Support Services   [facility] Support Services Team contacted patient to assess for food insecurity and other psychosocial needs during current COVID19 pandemic.    Patient/family expressed no needs at this time.  Support Team member encouraged patient to call if changes occur or they have any other questions/concerns.    Anne C Cunningham, LCSW  [facility]        

## 2018-11-09 ENCOUNTER — Encounter: Payer: Self-pay | Admitting: Adult Health

## 2018-11-09 ENCOUNTER — Other Ambulatory Visit: Payer: Self-pay | Admitting: Oncology

## 2018-11-09 ENCOUNTER — Telehealth: Payer: Self-pay | Admitting: Nurse Practitioner

## 2018-11-09 ENCOUNTER — Other Ambulatory Visit: Payer: BLUE CROSS/BLUE SHIELD

## 2018-11-09 ENCOUNTER — Inpatient Hospital Stay: Payer: BLUE CROSS/BLUE SHIELD

## 2018-11-09 ENCOUNTER — Inpatient Hospital Stay (HOSPITAL_BASED_OUTPATIENT_CLINIC_OR_DEPARTMENT_OTHER): Payer: BLUE CROSS/BLUE SHIELD | Admitting: Adult Health

## 2018-11-09 ENCOUNTER — Other Ambulatory Visit: Payer: Self-pay

## 2018-11-09 VITALS — BP 109/69 | HR 68 | Temp 99.1°F | Resp 18 | Ht 62.5 in | Wt 158.6 lb

## 2018-11-09 DIAGNOSIS — Z170 Estrogen receptor positive status [ER+]: Secondary | ICD-10-CM

## 2018-11-09 DIAGNOSIS — C50912 Malignant neoplasm of unspecified site of left female breast: Secondary | ICD-10-CM

## 2018-11-09 DIAGNOSIS — Z9012 Acquired absence of left breast and nipple: Secondary | ICD-10-CM

## 2018-11-09 DIAGNOSIS — C50412 Malignant neoplasm of upper-outer quadrant of left female breast: Secondary | ICD-10-CM | POA: Diagnosis not present

## 2018-11-09 DIAGNOSIS — Z853 Personal history of malignant neoplasm of breast: Secondary | ICD-10-CM

## 2018-11-09 DIAGNOSIS — Z95828 Presence of other vascular implants and grafts: Secondary | ICD-10-CM

## 2018-11-09 DIAGNOSIS — Z79899 Other long term (current) drug therapy: Secondary | ICD-10-CM

## 2018-11-09 LAB — CBC WITH DIFFERENTIAL/PLATELET
Abs Immature Granulocytes: 0.02 10*3/uL (ref 0.00–0.07)
Basophils Absolute: 0 10*3/uL (ref 0.0–0.1)
Basophils Relative: 0 %
Eosinophils Absolute: 0 10*3/uL (ref 0.0–0.5)
Eosinophils Relative: 0 %
HCT: 33 % — ABNORMAL LOW (ref 36.0–46.0)
Hemoglobin: 10.8 g/dL — ABNORMAL LOW (ref 12.0–15.0)
Immature Granulocytes: 0 %
Lymphocytes Relative: 10 %
Lymphs Abs: 0.6 10*3/uL — ABNORMAL LOW (ref 0.7–4.0)
MCH: 31.4 pg (ref 26.0–34.0)
MCHC: 32.7 g/dL (ref 30.0–36.0)
MCV: 95.9 fL (ref 80.0–100.0)
Monocytes Absolute: 0.1 10*3/uL (ref 0.1–1.0)
Monocytes Relative: 1 %
Neutro Abs: 4.8 10*3/uL (ref 1.7–7.7)
Neutrophils Relative %: 89 %
Platelets: 313 10*3/uL (ref 150–400)
RBC: 3.44 MIL/uL — ABNORMAL LOW (ref 3.87–5.11)
RDW: 14.6 % (ref 11.5–15.5)
WBC: 5.4 10*3/uL (ref 4.0–10.5)
nRBC: 0 % (ref 0.0–0.2)

## 2018-11-09 LAB — COMPREHENSIVE METABOLIC PANEL
ALT: 15 U/L (ref 0–44)
AST: 13 U/L — ABNORMAL LOW (ref 15–41)
Albumin: 3.6 g/dL (ref 3.5–5.0)
Alkaline Phosphatase: 94 U/L (ref 38–126)
Anion gap: 9 (ref 5–15)
BUN: 10 mg/dL (ref 6–20)
CO2: 23 mmol/L (ref 22–32)
Calcium: 9.2 mg/dL (ref 8.9–10.3)
Chloride: 108 mmol/L (ref 98–111)
Creatinine, Ser: 0.64 mg/dL (ref 0.44–1.00)
GFR calc Af Amer: >60 mL/min (ref >60)
GFR calc non Af Amer: >60 mL/min (ref >60)
Glucose, Bld: 117 mg/dL — ABNORMAL HIGH (ref 70–99)
Potassium: 4.1 mmol/L (ref 3.5–5.1)
Sodium: 140 mmol/L (ref 135–145)
Total Bilirubin: <0.2 mg/dL — ABNORMAL LOW (ref 0.3–1.2)
Total Protein: 7 g/dL (ref 6.5–8.1)

## 2018-11-09 MED ORDER — PALONOSETRON HCL INJECTION 0.25 MG/5ML
0.2500 mg | Freq: Once | INTRAVENOUS | Status: AC
  Administered 2018-11-09: 13:00:00 0.25 mg via INTRAVENOUS

## 2018-11-09 MED ORDER — DIPHENHYDRAMINE HCL 25 MG PO CAPS
25.0000 mg | ORAL_CAPSULE | Freq: Once | ORAL | Status: AC
  Administered 2018-11-09: 25 mg via ORAL

## 2018-11-09 MED ORDER — SODIUM CHLORIDE 0.9% FLUSH
10.0000 mL | Freq: Once | INTRAVENOUS | Status: AC
  Administered 2018-11-09: 10 mL
  Filled 2018-11-09: qty 10

## 2018-11-09 MED ORDER — SODIUM CHLORIDE 0.9 % IV SOLN
Freq: Once | INTRAVENOUS | Status: AC
  Administered 2018-11-09: 13:00:00 via INTRAVENOUS
  Filled 2018-11-09: qty 5

## 2018-11-09 MED ORDER — ACETAMINOPHEN 325 MG PO TABS
ORAL_TABLET | ORAL | Status: AC
  Filled 2018-11-09: qty 2

## 2018-11-09 MED ORDER — SODIUM CHLORIDE 0.9% FLUSH
10.0000 mL | INTRAVENOUS | Status: DC | PRN
  Administered 2018-11-09: 16:00:00 10 mL
  Filled 2018-11-09: qty 10

## 2018-11-09 MED ORDER — SODIUM CHLORIDE 0.9 % IV SOLN
420.0000 mg | Freq: Once | INTRAVENOUS | Status: AC
  Administered 2018-11-09: 12:00:00 420 mg via INTRAVENOUS
  Filled 2018-11-09: qty 14

## 2018-11-09 MED ORDER — PALONOSETRON HCL INJECTION 0.25 MG/5ML
INTRAVENOUS | Status: AC
  Filled 2018-11-09: qty 5

## 2018-11-09 MED ORDER — TRASTUZUMAB CHEMO 150 MG IV SOLR
450.0000 mg | Freq: Once | INTRAVENOUS | Status: AC
  Administered 2018-11-09: 450 mg via INTRAVENOUS
  Filled 2018-11-09: qty 21.43

## 2018-11-09 MED ORDER — ACETAMINOPHEN 325 MG PO TABS
650.0000 mg | ORAL_TABLET | Freq: Once | ORAL | Status: AC
  Administered 2018-11-09: 11:00:00 650 mg via ORAL

## 2018-11-09 MED ORDER — DIPHENHYDRAMINE HCL 25 MG PO CAPS
ORAL_CAPSULE | ORAL | Status: AC
  Filled 2018-11-09: qty 1

## 2018-11-09 MED ORDER — SODIUM CHLORIDE 0.9 % IV SOLN
75.0000 mg/m2 | Freq: Once | INTRAVENOUS | Status: AC
  Administered 2018-11-09: 140 mg via INTRAVENOUS
  Filled 2018-11-09: qty 14

## 2018-11-09 MED ORDER — HEPARIN SOD (PORK) LOCK FLUSH 100 UNIT/ML IV SOLN
500.0000 [IU] | Freq: Once | INTRAVENOUS | Status: AC | PRN
  Administered 2018-11-09: 16:00:00 500 [IU]
  Filled 2018-11-09: qty 5

## 2018-11-09 MED ORDER — SODIUM CHLORIDE 0.9 % IV SOLN
Freq: Once | INTRAVENOUS | Status: AC
  Administered 2018-11-09: 10:00:00 via INTRAVENOUS
  Filled 2018-11-09: qty 250

## 2018-11-09 MED ORDER — SODIUM CHLORIDE 0.9 % IV SOLN
604.0000 mg | Freq: Once | INTRAVENOUS | Status: AC
  Administered 2018-11-09: 15:00:00 600 mg via INTRAVENOUS
  Filled 2018-11-09: qty 60

## 2018-11-09 NOTE — Patient Instructions (Signed)
[facility] Discharge Instructions for Patients Receiving Chemotherapy  Today you received the following chemotherapy agents  Trastuzumab (Herceptin),Pertuzumab (Perjeta), Docetaxel (Taxotere),  Carboplatin   To help prevent nausea and vomiting after your treatment, we encourage you to take your nausea medication as directed.   If you develop nausea and vomiting that is not controlled by your nausea medication, call the clinic.   BELOW ARE SYMPTOMS THAT SHOULD BE REPORTED IMMEDIATELY:  *FEVER GREATER THAN 100.5 F  *CHILLS WITH OR WITHOUT FEVER  NAUSEA AND VOMITING THAT IS NOT CONTROLLED WITH YOUR NAUSEA MEDICATION  *UNUSUAL SHORTNESS OF BREATH  *UNUSUAL BRUISING OR BLEEDING  TENDERNESS IN MOUTH AND THROAT WITH OR WITHOUT PRESENCE OF ULCERS  *URINARY PROBLEMS  *BOWEL PROBLEMS  UNUSUAL RASH Items with * indicate a potential emergency and should be followed up as soon as possible.  Feel free to call the clinic should you have any questions or concerns. The clinic phone number is [phone].  Please show the [place] at check-in to the Emergency Department and triage nurse.  Trastuzumab injection for infusion (Herceptin) What is this medicine? TRASTUZUMAB (tras TOO zoo mab) is a monoclonal antibody. It is used to treat breast cancer and stomach cancer. This medicine may be used for other purposes; ask your health care provider or pharmacist if you have questions. COMMON BRAND NAME(S): Herceptin, Calla Kicks, OGIVRI What should I tell my health care provider before I take this medicine? They need to know if you have any of these conditions: -heart disease -heart failure -lung or breathing disease, like asthma -an unusual or allergic reaction to trastuzumab, benzyl alcohol, or other medications, foods, dyes, or preservatives -pregnant or trying to get pregnant -breast-feeding How should I use this medicine? This drug is given as an infusion into a  vein. It is administered in a hospital or clinic by a specially trained health care professional. Talk to your pediatrician regarding the use of this medicine in children. This medicine is not approved for use in children. Overdosage: If you think you have taken too much of this medicine contact a poison control center or emergency room at once. NOTE: This medicine is only for you. Do not share this medicine with others. What if I miss a dose? It is important not to miss a dose. Call your doctor or health care professional if you are unable to keep an appointment. What may interact with this medicine? This medicine may interact with the following medications: -certain types of chemotherapy, such as daunorubicin, doxorubicin, epirubicin, and idarubicin This list may not describe all possible interactions. Give your health care provider a list of all the medicines, herbs, non-prescription drugs, or dietary supplements you use. Also tell them if you smoke, drink alcohol, or use illegal drugs. Some items may interact with your medicine. What should I watch for while using this medicine? Visit your doctor for checks on your progress. Report any side effects. Continue your course of treatment even though you feel ill unless your doctor tells you to stop. Call your doctor or health care professional for advice if you get a fever, chills or sore throat, or other symptoms of a cold or flu. Do not treat yourself. Try to avoid being around people who are sick. You may experience fever, chills and shaking during your first infusion. These effects are usually mild and can be treated with other medicines. Report any side effects during the infusion to your health care professional. Fever and chills usually  do not happen with later infusions. Do not become pregnant while taking this medicine or for 7 months after stopping it. Women should inform their doctor if they wish to become pregnant or think they might be  pregnant. Women of child-bearing potential will need to have a negative pregnancy test before starting this medicine. There is a potential for serious side effects to an unborn child. Talk to your health care professional or pharmacist for more information. Do not breast-feed an infant while taking this medicine or for 7 months after stopping it. Women must use effective birth control with this medicine. What side effects may I notice from receiving this medicine? Side effects that you should report to your doctor or health care professional as soon as possible: -allergic reactions like skin rash, itching or hives, swelling of the face, lips, or tongue -chest pain or palpitations -cough -dizziness -feeling faint or lightheaded, falls -fever -general ill feeling or flu-like symptoms -signs of worsening heart failure like breathing problems; swelling in your legs and feet -unusually weak or tired Side effects that usually do not require medical attention (report to your doctor or health care professional if they continue or are bothersome): -bone pain -changes in taste -diarrhea -joint pain -nausea/vomiting -weight loss This list may not describe all possible side effects. Call your doctor for medical advice about side effects. You may report side effects to FDA at 1-800-FDA-1088. Where should I keep my medicine? This drug is given in a hospital or clinic and will not be stored at home. NOTE: This sheet is a summary. It may not cover all possible information. If you have questions about this medicine, talk to your doctor, pharmacist, or health care provider.  2019 Elsevier/Gold Standard (2016-07-06 14:37:52)  Pertuzumab injection (Perjeta) What is this medicine? PERTUZUMAB (per TOOZ ue mab) is a monoclonal antibody. It is used to treat breast cancer. This medicine may be used for other purposes; ask your health care provider or pharmacist if you have questions. COMMON BRAND NAME(S):  PERJETA What should I tell my health care provider before I take this medicine? They need to know if you have any of these conditions: -heart disease -heart failure -high blood pressure -history of irregular heart beat -recent or ongoing radiation therapy -an unusual or allergic reaction to pertuzumab, other medicines, foods, dyes, or preservatives -pregnant or trying to get pregnant -breast-feeding How should I use this medicine? This medicine is for infusion into a vein. It is given by a health care professional in a hospital or clinic setting. Talk to your pediatrician regarding the use of this medicine in children. Special care may be needed. Overdosage: If you think you have taken too much of this medicine contact a poison control center or emergency room at once. NOTE: This medicine is only for you. Do not share this medicine with others. What if I miss a dose? It is important not to miss your dose. Call your doctor or health care professional if you are unable to keep an appointment. What may interact with this medicine? Interactions are not expected. Give your health care provider a list of all the medicines, herbs, non-prescription drugs, or dietary supplements you use. Also tell them if you smoke, drink alcohol, or use illegal drugs. Some items may interact with your medicine. This list may not describe all possible interactions. Give your health care provider a list of all the medicines, herbs, non-prescription drugs, or dietary supplements you use. Also tell them if you smoke, drink  alcohol, or use illegal drugs. Some items may interact with your medicine. What should I watch for while using this medicine? Your condition will be monitored carefully while you are receiving this medicine. Report any side effects. Continue your course of treatment even though you feel ill unless your doctor tells you to stop. Do not become pregnant while taking this medicine or for 7 months after  stopping it. Women should inform their doctor if they wish to become pregnant or think they might be pregnant. Women of child-bearing potential will need to have a negative pregnancy test before starting this medicine. There is a potential for serious side effects to an unborn child. Talk to your health care professional or pharmacist for more information. Do not breast-feed an infant while taking this medicine or for 7 months after stopping it. Women must use effective birth control with this medicine. Call your doctor or health care professional for advice if you get a fever, chills or sore throat, or other symptoms of a cold or flu. Do not treat yourself. Try to avoid being around people who are sick. You may experience fever, chills, and headache during the infusion. Report any side effects during the infusion to your health care professional. What side effects may I notice from receiving this medicine? Side effects that you should report to your doctor or health care professional as soon as possible: -breathing problems -chest pain or palpitations -dizziness -feeling faint or lightheaded -fever or chills -skin rash, itching or hives -sore throat -swelling of the face, lips, or tongue -swelling of the legs or ankles -unusually weak or tired Side effects that usually do not require medical attention (report to your doctor or health care professional if they continue or are bothersome): -diarrhea -hair loss -nausea, vomiting -tiredness This list may not describe all possible side effects. Call your doctor for medical advice about side effects. You may report side effects to FDA at 1-800-FDA-1088. Where should I keep my medicine? This drug is given in a hospital or clinic and will not be stored at home. NOTE: This sheet is a summary. It may not cover all possible information. If you have questions about this medicine, talk to your doctor, pharmacist, or health care provider.  2019  Elsevier/Gold Standard (2015-08-14 12:08:50)  Docetaxel injection What is this medicine? DOCETAXEL (doe se TAX el) is a chemotherapy drug. It targets fast dividing cells, like cancer cells, and causes these cells to die. This medicine is used to treat many types of cancers like breast cancer, certain stomach cancers, head and neck cancer, lung cancer, and prostate cancer. This medicine may be used for other purposes; ask your health care provider or pharmacist if you have questions. COMMON BRAND NAME(S): Docefrez, Taxotere What should I tell my health care provider before I take this medicine? They need to know if you have any of these conditions: -infection (especially a virus infection such as chickenpox, cold sores, or herpes) -liver disease -low blood counts, like low white cell, platelet, or red cell counts -an unusual or allergic reaction to docetaxel, polysorbate 80, other chemotherapy agents, other medicines, foods, dyes, or preservatives -pregnant or trying to get pregnant -breast-feeding How should I use this medicine? This drug is given as an infusion into a vein. It is administered in a hospital or clinic by a specially trained health care professional. Talk to your pediatrician regarding the use of this medicine in children. Special care may be needed. Overdosage: If you think you have taken  too much of this medicine contact a poison control center or emergency room at once. NOTE: This medicine is only for you. Do not share this medicine with others. What if I miss a dose? It is important not to miss your dose. Call your doctor or health care professional if you are unable to keep an appointment. What may interact with this medicine? -cyclosporine -erythromycin -ketoconazole -medicines to increase blood counts like filgrastim, pegfilgrastim, sargramostim -vaccines Talk to your doctor or health care professional before taking any of these  medicines: -acetaminophen -aspirin -ibuprofen -ketoprofen -naproxen This list may not describe all possible interactions. Give your health care provider a list of all the medicines, herbs, non-prescription drugs, or dietary supplements you use. Also tell them if you smoke, drink alcohol, or use illegal drugs. Some items may interact with your medicine. What should I watch for while using this medicine? Your condition will be monitored carefully while you are receiving this medicine. You will need important blood work done while you are taking this medicine. This drug may make you feel generally unwell. This is not uncommon, as chemotherapy can affect healthy cells as well as cancer cells. Report any side effects. Continue your course of treatment even though you feel ill unless your doctor tells you to stop. In some cases, you may be given additional medicines to help with side effects. Follow all directions for their use. Call your doctor or health care professional for advice if you get a fever, chills or sore throat, or other symptoms of a cold or flu. Do not treat yourself. This drug decreases your body's ability to fight infections. Try to avoid being around people who are sick. This medicine may increase your risk to bruise or bleed. Call your doctor or health care professional if you notice any unusual bleeding. This medicine may contain alcohol in the product. You may get drowsy or dizzy. Do not drive, use machinery, or do anything that needs mental alertness until you know how this medicine affects you. Do not stand or sit up quickly, especially if you are an older patient. This reduces the risk of dizzy or fainting spells. Avoid alcoholic drinks. Do not become pregnant while taking this medicine or for 6 months after stopping it. Women should inform their doctor if they wish to become pregnant or think they might be pregnant. Men should not father a child while taking this medicine and for 3  months after stopping it. There is a potential for serious side effects to an unborn child. Talk to your health care professional or pharmacist for more information. Do not breast-feed an infant while taking this medicine or for 2 weeks after stopping it. This may interfere with the ability to father a child. You should talk to your doctor or health care professional if you are concerned about your fertility. What side effects may I notice from receiving this medicine? Side effects that you should report to your doctor or health care professional as soon as possible: -allergic reactions like skin rash, itching or hives, swelling of the face, lips, or tongue -low blood counts - This drug may decrease the number of white blood cells, red blood cells and platelets. You may be at increased risk for infections and bleeding. -signs of infection - fever or chills, cough, sore throat, pain or difficulty passing urine -signs of decreased platelets or bleeding - bruising, pinpoint red spots on the skin, black, tarry stools, nosebleeds -signs of decreased red blood cells - unusually  weak or tired, fainting spells, lightheadedness -breathing problems -fast or irregular heartbeat -low blood pressure -mouth sores -nausea and vomiting -pain, swelling, redness or irritation at the injection site -pain, tingling, numbness in the hands or feet -swelling of the ankle, feet, hands -weight gain Side effects that usually do not require medical attention (report to your doctor or health care professional if they continue or are bothersome): -bone pain -complete hair loss including hair on your head, underarms, pubic hair, eyebrows, and eyelashes -diarrhea -excessive tearing -changes in the color of fingernails -loosening of the fingernails -nausea -muscle pain -red flush to skin -sweating -weak or tired This list may not describe all possible side effects. Call your doctor for medical advice about side  effects. You may report side effects to FDA at 1-800-FDA-1088. Where should I keep my medicine? This drug is given in a hospital or clinic and will not be stored at home. NOTE: This sheet is a summary. It may not cover all possible information. If you have questions about this medicine, talk to your doctor, pharmacist, or health care provider.  2019 Elsevier/Gold Standard (2017-08-08 12:07:21)   Carboplatin injection  What is this medicine? CARBOPLATIN (KAR boe pla tin) is a chemotherapy drug. It targets fast dividing cells, like cancer cells, and causes these cells to die. This medicine is used to treat ovarian cancer and many other cancers. This medicine may be used for other purposes; ask your health care provider or pharmacist if you have questions. COMMON BRAND NAME(S): Paraplatin What should I tell my health care provider before I take this medicine? They need to know if you have any of these conditions: -blood disorders -hearing problems -kidney disease -recent or ongoing radiation therapy -an unusual or allergic reaction to carboplatin, cisplatin, other chemotherapy, other medicines, foods, dyes, or preservatives -pregnant or trying to get pregnant -breast-feeding How should I use this medicine? This drug is usually given as an infusion into a vein. It is administered in a hospital or clinic by a specially trained health care professional. Talk to your pediatrician regarding the use of this medicine in children. Special care may be needed. Overdosage: If you think you have taken too much of this medicine contact a poison control center or emergency room at once. NOTE: This medicine is only for you. Do not share this medicine with others. What if I miss a dose? It is important not to miss a dose. Call your doctor or health care professional if you are unable to keep an appointment. What may interact with this medicine? -medicines for seizures -medicines to increase blood counts  like filgrastim, pegfilgrastim, sargramostim -some antibiotics like amikacin, gentamicin, neomycin, streptomycin, tobramycin -vaccines Talk to your doctor or health care professional before taking any of these medicines: -acetaminophen -aspirin -ibuprofen -ketoprofen -naproxen This list may not describe all possible interactions. Give your health care provider a list of all the medicines, herbs, non-prescription drugs, or dietary supplements you use. Also tell them if you smoke, drink alcohol, or use illegal drugs. Some items may interact with your medicine. What should I watch for while using this medicine? Your condition will be monitored carefully while you are receiving this medicine. You will need important blood work done while you are taking this medicine. This drug may make you feel generally unwell. This is not uncommon, as chemotherapy can affect healthy cells as well as cancer cells. Report any side effects. Continue your course of treatment even though you feel ill unless your doctor tells  you to stop. In some cases, you may be given additional medicines to help with side effects. Follow all directions for their use. Call your doctor or health care professional for advice if you get a fever, chills or sore throat, or other symptoms of a cold or flu. Do not treat yourself. This drug decreases your body's ability to fight infections. Try to avoid being around people who are sick. This medicine may increase your risk to bruise or bleed. Call your doctor or health care professional if you notice any unusual bleeding. Be careful brushing and flossing your teeth or using a toothpick because you may get an infection or bleed more easily. If you have any dental work done, tell your dentist you are receiving this medicine. Avoid taking products that contain aspirin, acetaminophen, ibuprofen, naproxen, or ketoprofen unless instructed by your doctor. These medicines may hide a fever. Do not become  pregnant while taking this medicine. Women should inform their doctor if they wish to become pregnant or think they might be pregnant. There is a potential for serious side effects to an unborn child. Talk to your health care professional or pharmacist for more information. Do not breast-feed an infant while taking this medicine. What side effects may I notice from receiving this medicine? Side effects that you should report to your doctor or health care professional as soon as possible: -allergic reactions like skin rash, itching or hives, swelling of the face, lips, or tongue -signs of infection - fever or chills, cough, sore throat, pain or difficulty passing urine -signs of decreased platelets or bleeding - bruising, pinpoint red spots on the skin, black, tarry stools, nosebleeds -signs of decreased red blood cells - unusually weak or tired, fainting spells, lightheadedness -breathing problems -changes in hearing -changes in vision -chest pain -high blood pressure -low blood counts - This drug may decrease the number of white blood cells, red blood cells and platelets. You may be at increased risk for infections and bleeding. -nausea and vomiting -pain, swelling, redness or irritation at the injection site -pain, tingling, numbness in the hands or feet -problems with balance, talking, walking -trouble passing urine or change in the amount of urine Side effects that usually do not require medical attention (report to your doctor or health care professional if they continue or are bothersome): -hair loss -loss of appetite -metallic taste in the mouth or changes in taste This list may not describe all possible side effects. Call your doctor for medical advice about side effects. You may report side effects to FDA at 1-800-FDA-1088. Where should I keep my medicine? This drug is given in a hospital or clinic and will not be stored at home. NOTE: This sheet is a summary. It may not cover all  possible information. If you have questions about this medicine, talk to your doctor, pharmacist, or health care provider.  2019 Elsevier/Gold Standard (2007-10-17 14:38:05)

## 2018-11-09 NOTE — Telephone Encounter (Signed)
Called and talked with patient. Calling to schedule virtual visit with Paige Foster. Patient states that she does not want to schedule visit at this time

## 2018-11-09 NOTE — Patient Instructions (Signed)

## 2018-11-10 ENCOUNTER — Ambulatory Visit: Payer: BLUE CROSS/BLUE SHIELD | Admitting: Adult Health

## 2018-11-10 ENCOUNTER — Other Ambulatory Visit: Payer: Self-pay

## 2018-11-10 ENCOUNTER — Inpatient Hospital Stay: Payer: BLUE CROSS/BLUE SHIELD

## 2018-11-10 ENCOUNTER — Ambulatory Visit: Payer: BLUE CROSS/BLUE SHIELD

## 2018-11-10 VITALS — BP 120/72 | HR 88 | Temp 98.1°F | Resp 18

## 2018-11-10 DIAGNOSIS — C50412 Malignant neoplasm of upper-outer quadrant of left female breast: Secondary | ICD-10-CM | POA: Diagnosis not present

## 2018-11-10 DIAGNOSIS — C50912 Malignant neoplasm of unspecified site of left female breast: Secondary | ICD-10-CM

## 2018-11-10 DIAGNOSIS — Z170 Estrogen receptor positive status [ER+]: Secondary | ICD-10-CM

## 2018-11-10 MED ORDER — PEGFILGRASTIM-CBQV 6 MG/0.6ML ~~LOC~~ SOSY
6.0000 mg | PREFILLED_SYRINGE | Freq: Once | SUBCUTANEOUS | Status: AC
  Administered 2018-11-10: 6 mg via SUBCUTANEOUS

## 2018-11-10 MED ORDER — PEGFILGRASTIM-CBQV 6 MG/0.6ML ~~LOC~~ SOSY
PREFILLED_SYRINGE | SUBCUTANEOUS | Status: AC
  Filled 2018-11-10: qty 0.6

## 2018-11-11 ENCOUNTER — Ambulatory Visit: Payer: BLUE CROSS/BLUE SHIELD

## 2018-11-23 ENCOUNTER — Other Ambulatory Visit: Payer: Self-pay | Admitting: *Deleted

## 2018-11-23 DIAGNOSIS — Z170 Estrogen receptor positive status [ER+]: Secondary | ICD-10-CM

## 2018-11-23 DIAGNOSIS — C50412 Malignant neoplasm of upper-outer quadrant of left female breast: Secondary | ICD-10-CM

## 2018-11-23 DIAGNOSIS — C50912 Malignant neoplasm of unspecified site of left female breast: Secondary | ICD-10-CM

## 2018-11-23 MED ORDER — LORAZEPAM 0.5 MG PO TABS: 0.5000 mg | ORAL_TABLET | Freq: Every evening | ORAL | 0 refills | Status: DC | PRN

## 2018-11-29 ENCOUNTER — Other Ambulatory Visit: Payer: Self-pay | Admitting: Oncology

## 2018-11-29 DIAGNOSIS — Z170 Estrogen receptor positive status [ER+]: Secondary | ICD-10-CM

## 2018-11-29 DIAGNOSIS — C50912 Malignant neoplasm of unspecified site of left female breast: Secondary | ICD-10-CM

## 2018-11-29 DIAGNOSIS — C50412 Malignant neoplasm of upper-outer quadrant of left female breast: Secondary | ICD-10-CM

## 2018-11-30 ENCOUNTER — Inpatient Hospital Stay: Payer: BC Managed Care – PPO | Attending: Oncology

## 2018-11-30 ENCOUNTER — Inpatient Hospital Stay: Payer: BC Managed Care – PPO

## 2018-11-30 ENCOUNTER — Other Ambulatory Visit: Payer: Self-pay | Admitting: Oncology

## 2018-11-30 ENCOUNTER — Other Ambulatory Visit: Payer: Self-pay

## 2018-11-30 DIAGNOSIS — Z7689 Persons encountering health services in other specified circumstances: Secondary | ICD-10-CM | POA: Diagnosis not present

## 2018-11-30 DIAGNOSIS — Z5111 Encounter for antineoplastic chemotherapy: Secondary | ICD-10-CM | POA: Diagnosis not present

## 2018-11-30 DIAGNOSIS — C50912 Malignant neoplasm of unspecified site of left female breast: Secondary | ICD-10-CM

## 2018-11-30 DIAGNOSIS — Z9012 Acquired absence of left breast and nipple: Secondary | ICD-10-CM | POA: Insufficient documentation

## 2018-11-30 DIAGNOSIS — Z170 Estrogen receptor positive status [ER+]: Secondary | ICD-10-CM

## 2018-11-30 DIAGNOSIS — Z79899 Other long term (current) drug therapy: Secondary | ICD-10-CM | POA: Insufficient documentation

## 2018-11-30 DIAGNOSIS — G629 Polyneuropathy, unspecified: Secondary | ICD-10-CM | POA: Insufficient documentation

## 2018-11-30 DIAGNOSIS — C50412 Malignant neoplasm of upper-outer quadrant of left female breast: Secondary | ICD-10-CM | POA: Insufficient documentation

## 2018-11-30 DIAGNOSIS — H04559 Acquired stenosis of unspecified nasolacrimal duct: Secondary | ICD-10-CM | POA: Insufficient documentation

## 2018-11-30 DIAGNOSIS — Z95828 Presence of other vascular implants and grafts: Secondary | ICD-10-CM

## 2018-11-30 LAB — CBC WITH DIFFERENTIAL/PLATELET
Abs Immature Granulocytes: 0.02 10*3/uL (ref 0.00–0.07)
Basophils Absolute: 0 10*3/uL (ref 0.0–0.1)
Basophils Relative: 0 %
Eosinophils Absolute: 0.1 10*3/uL (ref 0.0–0.5)
Eosinophils Relative: 2 %
HCT: 30.7 % — ABNORMAL LOW (ref 36.0–46.0)
Hemoglobin: 10.1 g/dL — ABNORMAL LOW (ref 12.0–15.0)
Immature Granulocytes: 0 %
Lymphocytes Relative: 28 %
Lymphs Abs: 1.9 10*3/uL (ref 0.7–4.0)
MCH: 32 pg (ref 26.0–34.0)
MCHC: 32.9 g/dL (ref 30.0–36.0)
MCV: 97.2 fL (ref 80.0–100.0)
Monocytes Absolute: 0.6 10*3/uL (ref 0.1–1.0)
Monocytes Relative: 8 %
Neutro Abs: 4.2 10*3/uL (ref 1.7–7.7)
Neutrophils Relative %: 62 %
Platelets: 225 10*3/uL (ref 150–400)
RBC: 3.16 MIL/uL — ABNORMAL LOW (ref 3.87–5.11)
RDW: 15.9 % — ABNORMAL HIGH (ref 11.5–15.5)
WBC: 6.8 10*3/uL (ref 4.0–10.5)
nRBC: 0 % (ref 0.0–0.2)

## 2018-11-30 LAB — COMPREHENSIVE METABOLIC PANEL
ALT: 17 U/L (ref 0–44)
AST: 18 U/L (ref 15–41)
Albumin: 3.7 g/dL (ref 3.5–5.0)
Alkaline Phosphatase: 76 U/L (ref 38–126)
Anion gap: 7 (ref 5–15)
BUN: 8 mg/dL (ref 6–20)
CO2: 25 mmol/L (ref 22–32)
Calcium: 8.7 mg/dL — ABNORMAL LOW (ref 8.9–10.3)
Chloride: 106 mmol/L (ref 98–111)
Creatinine, Ser: 0.67 mg/dL (ref 0.44–1.00)
GFR calc Af Amer: >60 mL/min (ref >60)
GFR calc non Af Amer: >60 mL/min (ref >60)
Glucose, Bld: 86 mg/dL (ref 70–99)
Potassium: 3.7 mmol/L (ref 3.5–5.1)
Sodium: 138 mmol/L (ref 135–145)
Total Bilirubin: 0.2 mg/dL — ABNORMAL LOW (ref 0.3–1.2)
Total Protein: 6.7 g/dL (ref 6.5–8.1)

## 2018-11-30 MED ORDER — HEPARIN SOD (PORK) LOCK FLUSH 100 UNIT/ML IV SOLN
500.0000 [IU] | Freq: Once | INTRAVENOUS | Status: AC
  Administered 2018-11-30: 15:00:00 500 [IU]
  Filled 2018-11-30: qty 5

## 2018-11-30 MED ORDER — SODIUM CHLORIDE 0.9% FLUSH
10.0000 mL | Freq: Once | INTRAVENOUS | Status: AC
  Administered 2018-11-30: 10 mL
  Filled 2018-11-30: qty 10

## 2018-11-30 NOTE — Progress Notes (Addendum)
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 At that time she underwent a left mastectomy for stage 0 (noninvasive) breast cancer, with transverse rectus abdominis (TRAM) flap construction under Dr. Towanda Malkin. She also underwent a right breast reduction. She took tamoxifen for three years.  More recently she underwent bilateral diagnostic mammography with tomography and left breast ultrasonography at [facility] on 01/03/2018 showing: Breast Density Category B. There is an oval fat containing lesion in the left breast upper outer quadrant posterior depth. No other significant masses, calcifications, or other findings are seen in either breast. Sonographically, there is a 1.5 cm lesion in the left breast upper outer quadrant posterior depth. This lesion is of mixed echogenicity. This correlates as palpated and with mammography findings. Follow up was recommended.  Close follow-up was suggested.  She then presented with a non-tender mass in the left reconstructed breast on 08/30/2018. On physical exam, there is a hard palpable lump measuring 2.0 cm in the upper outer left reconstructed breast 10 cm from the expected location of a nipple. Sonography over this area demonstrates a 1.8 cm x 1.7 cm x 1.4 cm mass in the left breast at 2 o'clock posterior depth 10 cm from the nipple. This  mass is of mixed echogenicity. This abnormality is increased in size and correlates as palpated and with prior mammography findings. Color flow imaging demonstrates that there is vascularity present. Elastography imaging assessment is intermediate. No significant abnormalities were seen sonographically in the left axilla.    Accordingly on 08/30/2018 she proceeded to biopsy of the left breast mass in question. The pathology from this procedure showed ([id number]): invasive ductal carcinoma, grade III. Prognostic indicators significant for: estrogen receptor, 100% positive with strong staining intensity and progesterone receptor, 0% negative. Proliferation marker Ki67 at 15%. HER2 positive (3+) by immunohistochemistry.  The patient's subsequent history is as detailed below.   INTERVAL HISTORY: Tilla returns today for follow-up and treatment of her estrogen and HER-2 positive breast cancer.   She began neoadjuvant chemotherapy will consist of carboplatin, docetaxel, trastuzumab and Pertuzumab starting 09/26/2018, repeated every 21 days x 6. With Udenyca support on day 3.  Today is cycle 4 day 1 of treatment.    REVIEW OF SYSTEMS: Waneda is doing moderately well today.  She notes that she is having increased watering in her eyes.  She is taking allergy medication and notes she hasn't seen any improvement.  She also has numbness and tingling in the fingertips of her left hand.  There are no motor deficits noted.  She has also developed mucositis in the past week.  She has ulceration she says are worse on her gums, and she also has some mild pain when swallowing due to this.    Zayna denies any other issues such as fever or chills.  She hasn't had significant diarrhea or nausea.   She is without any constipation, bladder concerns, indigestion, stomach pain.  Murray Hodgkins  denies any skin changes, cough, shortness of breath, chest pain, or palpitations.  A detailed ROS was otherwise non contributory.    PAST  MEDICAL HISTORY: Past Medical History:  Diagnosis Date   History of colon polyps    Hypertension    Recurrent breast cancer, left [facility]) oncologist-- dr Jana Hakim    dx 2004, noninvasive Stage 0 ----s/p left mastectomy w/ tram flap construction (and right breast reduction), taken Tamoxifen for 3 yrs;   08-30-2018 recurrent left cancer , Grade III,  cT1c,  ER positive, PR negative, HER-2 positive, invasive ductal carcinoma-- neoadjuvant chemo to start 09-26-2018   Renal artery stenosis (HCC)    mild right external renal artery stenosis per duplex in epic 08-09-2013   Wears glasses     PAST SURGICAL HISTORY: Past Surgical History:  Procedure Laterality Date   COLONOSCOPY     MASTECTOMY Left 2004   w/  TRAM flap construction and right breast augmentation with abdominoplasy   PORTACATH PLACEMENT N/A 09/25/2018   Procedure: INSERTION PORT-A-CATH WITH ULTRASOUND;  Surgeon: Rolm Bookbinder, MD;  Location: [facility];  Service: General;  Laterality: N/A;   TUBAL LIGATION Bilateral yrs ago     FAMILY HISTORY: Family History  Problem Relation Age of Onset   Diabetes Mother    Hypertension Mother    Kidney disease Father    Colon cancer Neg Hx    Colon polyps Neg Hx    Gallbladder disease Neg Hx    Heart disease Neg Hx    Esophageal cancer Neg Hx    Lind's father died from unknown causes in his early 7's. Patients' mother died from diabetes complications at age 110. The patient has 1 sister. Patient denies anyone in her family having breast, ovarian, prostate, or pancreatic cancer.    GYNECOLOGIC HISTORY:  Patient's last menstrual period was 06/08/2007. 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 Her husband, Elwin Mocha, works at [employer]. Lailah has two children, Vonna Kotyk and  Shanon Brow. Vonna Kotyk lives with her, is 51, and it attending [employer] for a computer based degree. Shanon Brow lives with her, is 2, and recently graduated from [employer] with a degree in Careers information officer. Sherise has no grandchildren. She attends the [employer].   ADVANCED DIRECTIVES: Her husband, Elwin Mocha, is automatically her healthcare power of attorney     HEALTH MAINTENANCE: Social History   Tobacco Use   Smoking status: Never Smoker   Smokeless tobacco: Never Used  Substance Use Topics   Alcohol use: Not Currently    Alcohol/week: 0.0 standard drinks    Comment: Occassionally   Drug use: No    Colonoscopy: yes  PAP:   Bone density: yes, 2017; -1.6, osteopenic   No Known Allergies  Current Outpatient Medications  Medication Sig Dispense Refill   amLODipine (NORVASC) 10 MG tablet Take 1 tablet (10 mg total) by mouth at bedtime. 90 tablet 1   cycloSPORINE (RESTASIS) 0.05 % ophthalmic emulsion Place 1 drop into both eyes 2 (two) times daily. 0.4 mL 0   dexamethasone (DECADRON) 4 MG tablet Take 2 tablets (8 mg total) by mouth 2 (two) times daily. Start the day before Taxotere. Take once the day after, then 2 times a day x 2d. 30 tablet 1   Flaxseed, Linseed, (FLAXSEED OIL) 1000 MG CAPS Take  1,000 mg by mouth daily.     lidocaine-prilocaine (EMLA) cream Apply to affected area once 30 g 3   LORazepam (ATIVAN) 0.5 MG tablet TAKE 1 TABLET(0.5 MG) BY MOUTH AT BEDTIME AS NEEDED FOR NAUSEA OR VOMITING 30 tablet 0   losartan (COZAAR) 50 MG tablet Take 1 tablet (50 mg total) by mouth every morning. 90 tablet 1   magic mouthwash SOLN Take 5 mLs by mouth 3 (three) times daily. 240 mL 0   Nutritional Supplements (GRAPESEED EXTRACT PO) Take 1 capsule by mouth daily.     prochlorperazine (COMPAZINE) 10 MG tablet Take 1 tablet (10 mg total) by mouth every 6 (six) hours as needed (Nausea or vomiting). 30 tablet 1   Resveratrol 250 MG CAPS Take 250 mg by mouth daily.     traMADol (ULTRAM) 50 MG  tablet Take 2 tablets (100 mg total) by mouth every 6 (six) hours as needed. 8 tablet 0   TURMERIC PO Take 1,000 mg by mouth daily.     valACYclovir (VALTREX) 500 MG tablet Take 1 tablet (500 mg total) by mouth 2 (two) times daily. 60 tablet 0   No current facility-administered medications for this visit.    Facility-Administered Medications Ordered in Other Visits  Medication Dose Route Frequency Provider Last Rate Last Dose   acetaminophen (TYLENOL) tablet 650 mg  650 mg Oral Once Magrinat, Virgie Dad, MD       CARBOplatin (PARAPLATIN) 600 mg in sodium chloride 0.9 % 250 mL chemo infusion  600 mg Intravenous Once Magrinat, Virgie Dad, MD       diphenhydrAMINE (BENADRYL) capsule 25 mg  25 mg Oral Once Magrinat, Virgie Dad, MD       fosaprepitant (EMEND) 150 mg, dexamethasone (DECADRON) 12 mg in sodium chloride 0.9 % 145 mL IVPB   Intravenous Once Magrinat, Virgie Dad, MD       gemcitabine (GEMZAR) 1,482 mg in sodium chloride 0.9 % 100 mL chemo infusion  800 mg/m2 (Treatment Plan Recorded) Intravenous Once Magrinat, Virgie Dad, MD       heparin lock flush 100 unit/mL  500 Units Intracatheter Once PRN Magrinat, Virgie Dad, MD       palonosetron (ALOXI) injection 0.25 mg  0.25 mg Intravenous Once Magrinat, Virgie Dad, MD       pertuzumab (PERJETA) 420 mg in sodium chloride 0.9 % 250 mL chemo infusion  420 mg Intravenous Once Magrinat, Virgie Dad, MD       sodium chloride flush (NS) 0.9 % injection 10 mL  10 mL Intracatheter PRN Magrinat, Virgie Dad, MD       trastuzumab (HERCEPTIN) 462 mg in sodium chloride 0.9 % 250 mL chemo infusion  6 mg/kg (Treatment Plan Recorded) Intravenous Once Magrinat, Virgie Dad, MD         OBJECTIVE:   Vitals:   12/01/18 1118  BP: 139/85  Pulse: (!) 102  Resp: 18  Temp: 98.3 F (36.8 C)  SpO2: 100%     Body mass index is 28.37 kg/m.   Wt Readings from Last 3 Encounters:  12/01/18 157 lb 9.6 oz (71.5 kg)  11/09/18 158 lb 9.6 oz (71.9 kg)  10/26/18 160 lb 4.8 oz  (72.7 kg)  ECOG FS:1 GENERAL: Patient is a well appearing female in no acute distress HEENT:  Sclerae anicteric, but injected, and watering, no purulent drainage noted.  3 small ulcerations noted on left lower gum line and inner buccal mucosa, no candida noted.  Neck is supple.  NODES:  No cervical, supraclavicular, or axillary lymphadenopathy palpated.  BREAST EXAM:  Breast cancer is smaller per Dr. Jana Hakim exam LUNGS:  Clear to auscultation bilaterally.  No wheezes or rhonchi. HEART:  Regular rate and rhythm. No murmur appreciated. ABDOMEN:  Soft, nontender.  Positive, normoactive bowel sounds. No organomegaly palpated. MSK:  No focal spinal tenderness to palpation. Full range of motion bilaterally in the upper extremities. EXTREMITIES:  No peripheral edema.   SKIN:  Clear with no obvious rashes or skin changes. No nail dyscrasia. NEURO:  Nonfocal. Well oriented.  Appropriate affect.    LAB RESULTS:  CMP     Component Value Date/Time   NA 138 11/30/2018 1453   NA 140 10/12/2017 0950   K 3.7 11/30/2018 1453   CL 106 11/30/2018 1453   CO2 25 11/30/2018 1453   GLUCOSE 86 11/30/2018 1453   BUN 8 11/30/2018 1453   BUN 10 10/12/2017 0950   CREATININE 0.67 11/30/2018 1453   CREATININE 0.78 09/07/2018 1455   CALCIUM 8.7 (L) 11/30/2018 1453   PROT 6.7 11/30/2018 1453   PROT 7.5 10/12/2017 0950   ALBUMIN 3.7 11/30/2018 1453   ALBUMIN 4.3 10/12/2017 0950   AST 18 11/30/2018 1453   AST 14 (L) 09/07/2018 1455   ALT 17 11/30/2018 1453   ALT 13 09/07/2018 1455   ALKPHOS 76 11/30/2018 1453   BILITOT 0.2 (L) 11/30/2018 1453   BILITOT 0.2 (L) 09/07/2018 1455   GFRNONAA >60 11/30/2018 1453   GFRNONAA >60 09/07/2018 1455   GFRAA >60 11/30/2018 1453   GFRAA >60 09/07/2018 1455    No results found for: TOTALPROTELP, ALBUMINELP, A1GS, A2GS, BETS, BETA2SER, GAMS, MSPIKE, SPEI  No results found for: KPAFRELGTCHN, LAMBDASER, KAPLAMBRATIO  Lab Results  Component Value Date   WBC 6.8  11/30/2018   NEUTROABS 4.2 11/30/2018   HGB 10.1 (L) 11/30/2018   HCT 30.7 (L) 11/30/2018   MCV 97.2 11/30/2018   PLT 225 11/30/2018    Lab Results  Component Value Date   LABCA2 <4 08/30/2007    No components found for: [id number]  No results for input(s): INR in the last 168 hours.  Lab Results  Component Value Date   LABCA2 <4 08/30/2007    No results found for: PHX505  No results found for: WPV948  No results found for: AXK553  No results found for: CA2729  No components found for: HGQUANT  No results found for: CEA1 / No results found for: CEA1   No results found for: AFPTUMOR  No results found for: CHROMOGRNA  No results found for: PSA1  Appointment on 11/30/2018  Component Date Value Ref Range Status   Sodium 11/30/2018 138  135 - 145 mmol/L Final   Potassium 11/30/2018 3.7  3.5 - 5.1 mmol/L Final   Chloride 11/30/2018 106  98 - 111 mmol/L Final   CO2 11/30/2018 25  22 - 32 mmol/L Final   Glucose, Bld 11/30/2018 86  70 - 99 mg/dL Final   BUN 11/30/2018 8  6 - 20 mg/dL Final   Creatinine, Ser 11/30/2018 0.67  0.44 - 1.00 mg/dL Final   Calcium 11/30/2018 8.7* 8.9 - 10.3 mg/dL Final   Total Protein 11/30/2018 6.7  6.5 - 8.1 g/dL Final   Albumin 11/30/2018 3.7  3.5 - 5.0 g/dL Final   AST 11/30/2018 18  15 - 41 U/L Final   ALT 11/30/2018 17  0 - 44 U/L Final   Alkaline Phosphatase 11/30/2018 76  38 - 126 U/L Final  Total Bilirubin 11/30/2018 0.2* 0.3 - 1.2 mg/dL Final   GFR calc non Af Amer 11/30/2018 >60  >60 mL/min Final   GFR calc Af Amer 11/30/2018 >60  >60 mL/min Final   Anion gap 11/30/2018 7  5 - 15 Final   Performed at [facility], [place] [address], [place], [place] [address]   WBC 11/30/2018 6.8  4.0 - 10.5 K/uL Final   RBC 11/30/2018 3.16* 3.87 - 5.11 MIL/uL Final   Hemoglobin 11/30/2018 10.1* 12.0 - 15.0 g/dL Final   HCT 11/30/2018 30.7* 36.0 - 46.0 % Final   MCV 11/30/2018 97.2  80.0 - 100.0 fL  Final   MCH 11/30/2018 32.0  26.0 - 34.0 pg Final   MCHC 11/30/2018 32.9  30.0 - 36.0 g/dL Final   RDW 11/30/2018 15.9* 11.5 - 15.5 % Final   Platelets 11/30/2018 225  150 - 400 K/uL Final   nRBC 11/30/2018 0.0  0.0 - 0.2 % Final   Neutrophils Relative % 11/30/2018 62  % Final   Neutro Abs 11/30/2018 4.2  1.7 - 7.7 K/uL Final   Lymphocytes Relative 11/30/2018 28  % Final   Lymphs Abs 11/30/2018 1.9  0.7 - 4.0 K/uL Final   Monocytes Relative 11/30/2018 8  % Final   Monocytes Absolute 11/30/2018 0.6  0.1 - 1.0 K/uL Final   Eosinophils Relative 11/30/2018 2  % Final   Eosinophils Absolute 11/30/2018 0.1  0.0 - 0.5 K/uL Final   Basophils Relative 11/30/2018 0  % Final   Basophils Absolute 11/30/2018 0.0  0.0 - 0.1 K/uL Final   Immature Granulocytes 11/30/2018 0  % Final   Abs Immature Granulocytes 11/30/2018 0.02  0.00 - 0.07 K/uL Final   Performed at [facility], [place] [address], [place], [place] [address]    (this displays the last labs from the last 3 days)  No results found for: TOTALPROTELP, ALBUMINELP, A1GS, A2GS, BETS, BETA2SER, GAMS, MSPIKE, SPEI (this displays SPEP labs)  No results found for: KPAFRELGTCHN, LAMBDASER, KAPLAMBRATIO (kappa/lambda light chains)  No results found for: HGBA, HGBA2QUANT, HGBFQUANT, HGBSQUAN (Hemoglobinopathy evaluation)   Lab Results  Component Value Date   LDH 169 08/30/2007    Lab Results  Component Value Date   IRON 78 01/01/2009   TIBC 424 08/30/2007   IRONPCTSAT 17.9 (L) 01/01/2009   (Iron and TIBC)  Lab Results  Component Value Date   FERRITIN 19 08/30/2007    Urinalysis    Component Value Date/Time   LABSPEC 1.015 04/09/2008 1548   PHURINE 7.0 04/09/2008 1548   HGBUR large 04/09/2008 1548   BILIRUBINUR negative 04/09/2008 1548   UROBILINOGEN 0.2 04/09/2008 1548   NITRITE negative 04/09/2008 1548     STUDIES:  No results found.  ELIGIBLE FOR AVAILABLE RESEARCH PROTOCOL:     ASSESSMENT: 53 y.o. [place], [place] woman  (1) history of left-sided ductal carcinoma in situ 2004  (a) s/pleft mastectomy with TRAM reconstruction  (b) status post tamoxifen x3 years  (2) left breast upper outer quadrant biopsy 08/30/2018 shows a clinical T1c N0 invasive ductal carcinoma, grade 3, estrogen receptor positive, progesterone receptor negative, with HER-2 amplification, and and MIB-1 of 15%.   (3) neoadjuvant chemotherapy will consist of carboplatin, docetaxel, trastuzumab and Pertuzumab starting 09/26/2018, repeated every 21 days x 6.  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    (4) definitive surgery to follow  (5) adjuvant radiation therapy as  appropriate  (6) antiestrogens to follow at the completion of local treatment   PLAN: Virtie is doing moderately well.  She has started to have some side effects from her neoadjuvant chemotherapy.  Her labs are stable today and I reviewed those with her in detail.  She has developed numbness and tingling in her fingertips in her left hand.  She has a previous injury to this wrist.  She was recommended to wear a brace.  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    Shantea is also dealing with mucositis.  I prescribed Valtrex BID and Magic Mouthwash.    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  She was prescribed Restasis.  I also recommended she get systane eye gtts to use at least 1-2 hours apart from the restasis if needed.   Murray Hodgkins met with Dr. Jana Hakim.  He examined her breast and is delighted she is responding the the treatments, and her cancer is smaller.  He is however concerned about the numbness/tinging and dry eyes.  He recommended changing her chemotherapy from Docetaxel to gemcitabine for her final three treatments. He recommended that she receive Onpro due to covid 19 instead of returning for [place]. I spoke with Maggie in pharmacy who is going to  place those orders in the treatment plan.    We will see Tinnie back in 3 weeks for labs and f/u, along with her fifth cycle of neoadjuvant chemotherapy.  She knows to call for any questions or concerns prior to her next visit with [place].     Wilber Bihari, NP 12/01/18 12:15 PM Medical Oncology and Hematology [facility] [address] [place], [place] [address] Tel. [phone]    Fax. [phone]    ADDENDUM:  Dewayne is halfway through her neoadjuvant chemotherapy treatments.  She has had some response but there has not been resolution of the left breast mass, which is still palpable, although smaller.  It is still quite firm.  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  She has a good understanding of the possible toxicities side effects and complications of this agent.  She knows also that she does not need to take steroids a day 0.  We will try to get her OnPro so that she does not have to come here for an additional visit every time she receives chemo, given the current pandemic.  She knows to call for any other issue that may develop before the next visit.  I personally saw this patient and performed a substantive portion of this encounter with the listed APP documented above.   Chauncey Cruel, MD Medical Oncology and Hematology [facility] [address] [place], [place] [address] Tel. [phone]    Fax. [phone]

## 2018-12-01 ENCOUNTER — Inpatient Hospital Stay (HOSPITAL_BASED_OUTPATIENT_CLINIC_OR_DEPARTMENT_OTHER): Payer: BC Managed Care – PPO | Admitting: Adult Health

## 2018-12-01 ENCOUNTER — Encounter: Payer: Self-pay | Admitting: Adult Health

## 2018-12-01 ENCOUNTER — Encounter: Payer: Self-pay | Admitting: *Deleted

## 2018-12-01 ENCOUNTER — Inpatient Hospital Stay: Payer: BC Managed Care – PPO

## 2018-12-01 ENCOUNTER — Other Ambulatory Visit: Payer: Self-pay

## 2018-12-01 VITALS — HR 82

## 2018-12-01 VITALS — BP 139/85 | HR 102 | Temp 98.3°F | Resp 18 | Ht 62.5 in | Wt 157.6 lb

## 2018-12-01 DIAGNOSIS — C50412 Malignant neoplasm of upper-outer quadrant of left female breast: Secondary | ICD-10-CM

## 2018-12-01 DIAGNOSIS — G629 Polyneuropathy, unspecified: Secondary | ICD-10-CM

## 2018-12-01 DIAGNOSIS — H04559 Acquired stenosis of unspecified nasolacrimal duct: Secondary | ICD-10-CM | POA: Diagnosis not present

## 2018-12-01 DIAGNOSIS — Z170 Estrogen receptor positive status [ER+]: Secondary | ICD-10-CM | POA: Diagnosis not present

## 2018-12-01 DIAGNOSIS — C50912 Malignant neoplasm of unspecified site of left female breast: Secondary | ICD-10-CM

## 2018-12-01 DIAGNOSIS — Z79899 Other long term (current) drug therapy: Secondary | ICD-10-CM

## 2018-12-01 DIAGNOSIS — Z9012 Acquired absence of left breast and nipple: Secondary | ICD-10-CM

## 2018-12-01 MED ORDER — HEPARIN SOD (PORK) LOCK FLUSH 100 UNIT/ML IV SOLN
500.0000 [IU] | Freq: Once | INTRAVENOUS | Status: AC | PRN
  Administered 2018-12-01: 16:00:00 500 [IU]
  Filled 2018-12-01: qty 5

## 2018-12-01 MED ORDER — SODIUM CHLORIDE 0.9 % IV SOLN
800.0000 mg/m2 | Freq: Once | INTRAVENOUS | Status: AC
  Administered 2018-12-01: 15:00:00 1482 mg via INTRAVENOUS
  Filled 2018-12-01: qty 38.98

## 2018-12-01 MED ORDER — CYCLOSPORINE 0.05 % OP EMUL: 1.0000 [drp] | Freq: Two times a day (BID) | OPHTHALMIC | 0 refills | Status: DC

## 2018-12-01 MED ORDER — ACETAMINOPHEN 325 MG PO TABS
ORAL_TABLET | ORAL | Status: AC
  Filled 2018-12-01: qty 2

## 2018-12-01 MED ORDER — VALACYCLOVIR HCL 500 MG PO TABS: 500.0000 mg | ORAL_TABLET | Freq: Two times a day (BID) | ORAL | 0 refills | Status: DC

## 2018-12-01 MED ORDER — SODIUM CHLORIDE 0.9 % IV SOLN
420.0000 mg | Freq: Once | INTRAVENOUS | Status: AC
  Administered 2018-12-01: 14:00:00 420 mg via INTRAVENOUS
  Filled 2018-12-01: qty 14

## 2018-12-01 MED ORDER — DIPHENHYDRAMINE HCL 25 MG PO CAPS
25.0000 mg | ORAL_CAPSULE | Freq: Once | ORAL | Status: AC
  Administered 2018-12-01: 25 mg via ORAL

## 2018-12-01 MED ORDER — SODIUM CHLORIDE 0.9 % IV SOLN
604.0000 mg | Freq: Once | INTRAVENOUS | Status: AC
  Administered 2018-12-01: 16:00:00 600 mg via INTRAVENOUS
  Filled 2018-12-01: qty 60

## 2018-12-01 MED ORDER — MAGIC MOUTHWASH: 5.0000 mL | Freq: Three times a day (TID) | ORAL | 0 refills | Status: DC

## 2018-12-01 MED ORDER — PALONOSETRON HCL INJECTION 0.25 MG/5ML
INTRAVENOUS | Status: AC
  Filled 2018-12-01: qty 5

## 2018-12-01 MED ORDER — TRASTUZUMAB CHEMO 150 MG IV SOLR
450.0000 mg | Freq: Once | INTRAVENOUS | Status: AC
  Administered 2018-12-01: 14:00:00 450 mg via INTRAVENOUS
  Filled 2018-12-01: qty 21.43

## 2018-12-01 MED ORDER — SODIUM CHLORIDE 0.9 % IV SOLN
Freq: Once | INTRAVENOUS | Status: AC
  Administered 2018-12-01: 12:00:00 via INTRAVENOUS
  Filled 2018-12-01: qty 250

## 2018-12-01 MED ORDER — ACETAMINOPHEN 325 MG PO TABS
650.0000 mg | ORAL_TABLET | Freq: Once | ORAL | Status: AC
  Administered 2018-12-01: 650 mg via ORAL

## 2018-12-01 MED ORDER — SODIUM CHLORIDE 0.9% FLUSH
10.0000 mL | INTRAVENOUS | Status: DC | PRN
  Administered 2018-12-01: 10 mL
  Filled 2018-12-01: qty 10

## 2018-12-01 MED ORDER — PALONOSETRON HCL INJECTION 0.25 MG/5ML
0.2500 mg | Freq: Once | INTRAVENOUS | Status: AC
  Administered 2018-12-01: 0.25 mg via INTRAVENOUS

## 2018-12-01 MED ORDER — SODIUM CHLORIDE 0.9 % IV SOLN
Freq: Once | INTRAVENOUS | Status: AC
  Administered 2018-12-01: 12:00:00 via INTRAVENOUS
  Filled 2018-12-01: qty 5

## 2018-12-01 MED ORDER — DIPHENHYDRAMINE HCL 25 MG PO CAPS
ORAL_CAPSULE | ORAL | Status: AC
  Filled 2018-12-01: qty 1

## 2018-12-01 NOTE — Patient Instructions (Addendum)
You can use Systane eye drops for your eyes if needed as much as four times a day.  I would use these at least 1-2 hours before or after using the prescription Restasis we sent in for you.     Oral Mucositis Oral mucositis is a mouth condition that may develop as a result of treatments for cancer. Sores may appear on your lips, gums, tongue, throat, and the top (roof) or bottom (floor) of your mouth. What are the causes? This condition can happen to anyone who is being treated with cancer therapies, including:  Cancer medicines (chemotherapy).  Radiation therapy.  Bone marrow transplants and stem cell transplants. Cancer treatments can damage the lining of the mouth, which causes this condition. Oral mucositis is not caused by infection. However, the sores can become infected after they form. Infection can make oral mucositis worse. What increases the risk? The following factors may make you more likely to develop this condition:  Having poor oral hygiene.  Having dental problems or oral diseases.  Using products that contain nicotine or tobacco, such as cigarettes, chewing tobacco, and e-cigarettes.  Drinking alcohol.  Having other medical conditions, such as diabetes, HIV, AIDS, or kidney disease.  Not drinking enough clear fluids.  Wearing dentures that do not fit correctly.  Having cancers that primarily affect the blood.  Having cancers of the head and neck.  Receiving radiation therapy to the head and neck region. What are the signs or symptoms? Symptoms of this condition can vary from mild to severe. Symptoms are usually seen 7-10 days after cancer treatment has started. They include:  Mouth sores. These sores may bleed.  Color changes inside the mouth. Red, shiny areas may appear.  White patches or pus in the mouth.  Pain in the mouth and throat. This can make it painful to speak and swallow.  Dryness and a burning feeling in the mouth.  Saliva that is dry and  thick.  Trouble eating, drinking, and swallowing. This can lead to weight loss. How is this diagnosed? This condition can be diagnosed with a physical exam. In some cases, lab tests or cultures may be done to check for an associated infection. How is this treated? Treatment depends on the severity of the condition. Oral mucositis often heals on its own. Sometimes, changes in the cancer treatment can help. Treatment may include medicines, such as:  An antibiotic medicine to fight infection, if present.  Medicine to help the cells in your mouth heal more quickly. Medicine may also be given to help control pain. This may include:  Pain relievers that are swished around in the mouth. These make the mouth numb to ease the pain (topical anesthetics).  Mouth rinses.  Prescribed, medicated gels. The gel coats the mouth. This protects nerve endings and lessens the pain.  Pain medicines. Follow these instructions at home: Medicines  Take or apply over-the-counter and prescription medicines only as told by your health care provider.  If you were prescribed an antibiotic medicine, take or apply it as told by your health care provider. Do not stop using the antibiotic even if you start to feel better.  Do not use products that contain benzocaine (including numbing gels) to treat mouth pain in children who are younger than 2 years. These products may cause a rare but serious blood condition. Eating and drinking   Talk to a diet and nutrition specialist (dietitian) about what you should eat and drink if you have mucositis.  Drink high-nutrition  and high-calorie shakes or supplements.  Eat bland and soft foods that are easy to eat.  Drink enough fluid to keep your urine pale yellow.  Do not eat foods that are hot, spicy, citrus, or hard to swallow.  Do not drink alcohol. Lifestyle      Keep your mouth clean and germ-free. To maintain good oral hygiene: ? Brush your teeth carefully  with a soft toothbrush at least two times each day. Use a gentle toothpaste. Ask your health care provider to recommend the right toothpaste for you. ? Use a soft sponge (oral swab) to clean your mouth and teeth instead of a toothbrush if mouth sores are severe. ? Floss your teeth every day. ? Have your teeth cleaned regularly as recommended by your dentist. ? Rinse your mouth after every meal or as directed by your health care provider. Do not use mouthwash that contains alcohol. Ask your health care provider for a mouthwash or mouth rinse recommendation.  Do not use any products that contain nicotine or tobacco, such as cigarettes and e-cigarettes. If you need help quitting, ask your health care provider. General instructions  Follow instructions from your health care provider about: ? Cleaning mouth sores. ? Taking out your dentures. ? Changing your diet or finding other ways to get nutrients. This is important if you are losing weight.  If your lips are dry or cracked, apply a water-based moisturizer to your lips as needed.  Try sucking on ice chips or sugar-free frozen pops. This may help with pain. This also keeps your mouth moist.  Keep all follow-up visits as told by your health care provider. This is important. Contact a health care provider if:  You have mouth pain or throat pain.  You are having more trouble swallowing.  Your symptoms get worse.  You have new symptoms.  Your pain is not controlled with medicine.  You have trouble speaking. Get help right away if you:  Have a fever.  Cannot swallow solid food or liquids.  Have a lot of bleeding in your mouth.  Develop new, open, or draining sores in your mouth. Summary  Oral mucositis is a mouth condition that may develop as a result of treatments for cancer. Sores may appear on your lips, gums, tongue, throat, and the top (roof) or bottom (floor) of your mouth.  Cancer treatments can damage the lining of the  mouth, which causes this condition.  Treatment depends on how severe the condition is. It may include medicine to fight infection, medicine to ease pain, or medicine to help the cells in your mouth heal more quickly. This information is not intended to replace advice given to you by your health care provider. Make sure you discuss any questions you have with your health care provider. Document Released: 02/26/2011 Document Revised: 07/28/2017 Document Reviewed: 07/28/2017 Elsevier Interactive Patient Education  2019 [employer]. Valacyclovir caplets What is this medicine? VALACYCLOVIR (val ay SYE kloe veer) is an antiviral medicine. It is used to treat or prevent infections caused by certain kinds of viruses. Examples of these infections include herpes and shingles. This medicine will not cure herpes. This medicine may be used for other purposes; ask your health care provider or pharmacist if you have questions. COMMON BRAND NAME(S): Valtrex What should I tell my health care provider before I take this medicine? They need to know if you have any of these conditions: -acquired immunodeficiency syndrome (AIDS) -any other condition that may weaken the immune system -bone  marrow or kidney transplant -kidney disease -an unusual or allergic reaction to valacyclovir, acyclovir, ganciclovir, valganciclovir, other medicines, foods, dyes, or preservatives -pregnant or trying to get pregnant -breast-feeding How should I use this medicine? Take this medicine by mouth with a glass of water. Follow the directions on the prescription label. You can take this medicine with or without food. Take your doses at regular intervals. Do not take your medicine more often than directed. Finish the full course prescribed by your doctor or health care professional even if you think your condition is better. Do not stop taking except on the advice of your doctor or health care professional. Talk to your pediatrician  regarding the use of this medicine in children. While this drug may be prescribed for children as young as 2 years for selected conditions, precautions do apply. Overdosage: If you think you have taken too much of this medicine contact a poison control center or emergency room at once. NOTE: This medicine is only for you. Do not share this medicine with others. What if I miss a dose? If you miss a dose, take it as soon as you can. If it is almost time for your next dose, take only that dose. Do not take double or extra doses. What may interact with this medicine? -cimetidine -probenecid This list may not describe all possible interactions. Give your health care provider a list of all the medicines, herbs, non-prescription drugs, or dietary supplements you use. Also tell them if you smoke, drink alcohol, or use illegal drugs. Some items may interact with your medicine. What should I watch for while using this medicine? Tell your doctor or health care professional if your symptoms do not start to get better after 1 week. This medicine works best when taken early in the course of an infection, within the first 6 hours. Begin treatment as soon as possible after the first signs of infection like tingling, itching, or pain in the affected area. It is possible that genital herpes may still be spread even when you are not having symptoms. Always use safer sex practices like condoms made of latex or polyurethane whenever you have sexual contact. You should stay well hydrated while taking this medicine. Drink plenty of fluids. What side effects may I notice from receiving this medicine? Side effects that you should report to your doctor or health care professional as soon as possible: -allergic reactions like skin rash, itching or hives, swelling of the face, lips, or tongue -aggressive behavior -confusion -hallucinations -problems with balance, talking, walking -stomach pain -tremor -trouble passing  urine or change in the amount of urine Side effects that usually do not require medical attention (report to your doctor or health care professional if they continue or are bothersome): -dizziness -headache -nausea, vomiting This list may not describe all possible side effects. Call your doctor for medical advice about side effects. You may report side effects to FDA at 1-800-FDA-1088. Where should I keep my medicine? Keep out of the reach of children. Store at room temperature between 15 and 25 degrees C (59 and 77 degrees F). Keep container tightly closed. Throw away any unused medicine after the expiration date. NOTE: This sheet is a summary. It may not cover all possible information. If you have questions about this medicine, talk to your doctor, pharmacist, or health care provider.  2019 Elsevier/Gold Standard (2012-06-27 16:34:05)

## 2018-12-01 NOTE — Patient Instructions (Signed)
[facility] Discharge Instructions for Patients Receiving Chemotherapy  Today you received the following chemotherapy agents:  Herceptin (trastuzumab), Perjeta (pertuzumab), Gemzar (gemcitabine), Carboplatin   To help prevent nausea and vomiting after your treatment, we encourage you to take your nausea medication as prescribed.   If you develop nausea and vomiting that is not controlled by your nausea medication, call the clinic.   BELOW ARE SYMPTOMS THAT SHOULD BE REPORTED IMMEDIATELY:  *FEVER GREATER THAN 100.5 F  *CHILLS WITH OR WITHOUT FEVER  NAUSEA AND VOMITING THAT IS NOT CONTROLLED WITH YOUR NAUSEA MEDICATION  *UNUSUAL SHORTNESS OF BREATH  *UNUSUAL BRUISING OR BLEEDING  TENDERNESS IN MOUTH AND THROAT WITH OR WITHOUT PRESENCE OF ULCERS  *URINARY PROBLEMS  *BOWEL PROBLEMS  UNUSUAL RASH Items with * indicate a potential emergency and should be followed up as soon as possible.  Feel free to call the clinic should you have any questions or concerns. The clinic phone number is [phone].  Please show the [facility] at check-in to the Emergency Department and triage nurse.   Coronavirus (COVID-19) Are you at risk?  Are you at risk for the Coronavirus (COVID-19)?  To be considered HIGH RISK for Coronavirus (COVID-19), you have to meet the following criteria:  . Traveled to [place], [place], [place], [place] or [place]; or in the [place] to [place], [place], [place], or [place]; and have fever, cough, and shortness of breath within the last 2 weeks of travel OR . Been in close contact with a person diagnosed with COVID-19 within the last 2 weeks and have fever, cough, and shortness of breath . IF YOU DO NOT MEET THESE CRITERIA, YOU ARE CONSIDERED LOW RISK FOR COVID-19.  What to do if you are HIGH RISK for COVID-19?  Marland Kitchen If you are having a medical emergency, call 911. . Seek medical care right away. Before you go to a doctor's office,  urgent care or emergency department, call ahead and tell them about your recent travel, contact with someone diagnosed with COVID-19, and your symptoms. You should receive instructions from your physician's office regarding next steps of care.  . When you arrive at healthcare provider, tell the healthcare staff immediately you have returned from visiting [place], [place], [place], [place] or [place]; or traveled in the [place] to [place], [place], [place], or [place]; in the last two weeks or you have been in close contact with a person diagnosed with COVID-19 in the last 2 weeks.   . Tell the health care staff about your symptoms: fever, cough and shortness of breath. . After you have been seen by a medical provider, you will be either: o Tested for (COVID-19) and discharged home on quarantine except to seek medical care if symptoms worsen, and asked to  - Stay home and avoid contact with others until you get your results (4-5 days)  - Avoid travel on public transportation if possible (such as bus, train, or airplane) or o Sent to the Emergency Department by EMS for evaluation, COVID-19 testing, and possible admission depending on your condition and test results.  What to do if you are LOW RISK for COVID-19?  Reduce your risk of any infection by using the same precautions used for avoiding the common cold or flu:  Marland Kitchen Wash your hands often with soap and warm water for at least 20 seconds.  If soap and water are not readily available, use an alcohol-based hand sanitizer with at  least 60% alcohol.  . If coughing or sneezing, cover your mouth and nose by coughing or sneezing into the elbow areas of your shirt or coat, into a tissue or into your sleeve (not your hands). . Avoid shaking hands with others and consider head nods or verbal greetings only. . Avoid touching your eyes, nose, or mouth with unwashed hands.  . Avoid close contact with people who are sick. . Avoid places or events with  large numbers of people in one location, like concerts or sporting events. . Carefully consider travel plans you have or are making. . If you are planning any travel outside or inside the [place], visit the CDC's Travelers' Health webpage for the latest health notices. . If you have some symptoms but not all symptoms, continue to monitor at home and seek medical attention if your symptoms worsen. . If you are having a medical emergency, call 911.   Point / e-Visit: [email or web address]         [facility]: [facility]: [phone]                   [facility]: [phone]

## 2018-12-04 ENCOUNTER — Inpatient Hospital Stay: Payer: BC Managed Care – PPO

## 2018-12-04 ENCOUNTER — Other Ambulatory Visit: Payer: Self-pay

## 2018-12-04 VITALS — BP 140/91 | HR 80 | Temp 98.0°F | Resp 18

## 2018-12-04 DIAGNOSIS — C50912 Malignant neoplasm of unspecified site of left female breast: Secondary | ICD-10-CM

## 2018-12-04 DIAGNOSIS — C50412 Malignant neoplasm of upper-outer quadrant of left female breast: Secondary | ICD-10-CM | POA: Diagnosis not present

## 2018-12-04 MED ORDER — PEGFILGRASTIM-CBQV 6 MG/0.6ML ~~LOC~~ SOSY
6.0000 mg | PREFILLED_SYRINGE | Freq: Once | SUBCUTANEOUS | Status: AC
  Administered 2018-12-04: 15:00:00 6 mg via SUBCUTANEOUS

## 2018-12-04 MED ORDER — PEGFILGRASTIM-CBQV 6 MG/0.6ML ~~LOC~~ SOSY
PREFILLED_SYRINGE | SUBCUTANEOUS | Status: AC
  Filled 2018-12-04: qty 0.6

## 2018-12-04 NOTE — Patient Instructions (Signed)
Pegfilgrastim injection  What is this medicine?  PEGFILGRASTIM (PEG fil gra stim) is a long-acting granulocyte colony-stimulating factor that stimulates the growth of neutrophils, a type of white blood cell important in the body's fight against infection. It is used to reduce the incidence of fever and infection in patients with certain types of cancer who are receiving chemotherapy that affects the bone marrow, and to increase survival after being exposed to high doses of radiation.  This medicine may be used for other purposes; ask your health care provider or pharmacist if you have questions.  COMMON BRAND NAME(S): Fulphila, Neulasta, UDENYCA  What should I tell my health care provider before I take this medicine?  They need to know if you have any of these conditions:  -kidney disease  -latex allergy  -ongoing radiation therapy  -sickle cell disease  -skin reactions to acrylic adhesives (On-Body Injector only)  -an unusual or allergic reaction to pegfilgrastim, filgrastim, other medicines, foods, dyes, or preservatives  -pregnant or trying to get pregnant  -breast-feeding  How should I use this medicine?  This medicine is for injection under the skin. If you get this medicine at home, you will be taught how to prepare and give the pre-filled syringe or how to use the On-body Injector. Refer to the patient Instructions for Use for detailed instructions. Use exactly as directed. Tell your healthcare provider immediately if you suspect that the On-body Injector may not have performed as intended or if you suspect the use of the On-body Injector resulted in a missed or partial dose.  It is important that you put your used needles and syringes in a special sharps container. Do not put them in a trash can. If you do not have a sharps container, call your pharmacist or healthcare provider to get one.  Talk to your pediatrician regarding the use of this medicine in children. While this drug may be prescribed for  selected conditions, precautions do apply.  Overdosage: If you think you have taken too much of this medicine contact a poison control center or emergency room at once.  NOTE: This medicine is only for you. Do not share this medicine with others.  What if I miss a dose?  It is important not to miss your dose. Call your doctor or health care professional if you miss your dose. If you miss a dose due to an On-body Injector failure or leakage, a new dose should be administered as soon as possible using a single prefilled syringe for manual use.  What may interact with this medicine?  Interactions have not been studied.  Give your health care provider a list of all the medicines, herbs, non-prescription drugs, or dietary supplements you use. Also tell them if you smoke, drink alcohol, or use illegal drugs. Some items may interact with your medicine.  This list may not describe all possible interactions. Give your health care provider a list of all the medicines, herbs, non-prescription drugs, or dietary supplements you use. Also tell them if you smoke, drink alcohol, or use illegal drugs. Some items may interact with your medicine.  What should I watch for while using this medicine?  You may need blood work done while you are taking this medicine.  If you are going to need a MRI, CT scan, or other procedure, tell your doctor that you are using this medicine (On-Body Injector only).  What side effects may I notice from receiving this medicine?  Side effects that you should report to   your doctor or health care professional as soon as possible:  -allergic reactions like skin rash, itching or hives, swelling of the face, lips, or tongue  -back pain  -dizziness  -fever  -pain, redness, or irritation at site where injected  -pinpoint red spots on the skin  -red or dark-brown urine  -shortness of breath or breathing problems  -stomach or side pain, or pain at the shoulder  -swelling  -tiredness  -trouble passing urine or  change in the amount of urine  Side effects that usually do not require medical attention (report to your doctor or health care professional if they continue or are bothersome):  -bone pain  -muscle pain  This list may not describe all possible side effects. Call your doctor for medical advice about side effects. You may report side effects to FDA at 1-800-FDA-1088.  Where should I keep my medicine?  Keep out of the reach of children.  If you are using this medicine at home, you will be instructed on how to store it. Throw away any unused medicine after the expiration date on the label.  NOTE: This sheet is a summary. It may not cover all possible information. If you have questions about this medicine, talk to your doctor, pharmacist, or health care provider.   2019 Elsevier/Gold Standard (2017-10-17 16:57:08)

## 2018-12-06 ENCOUNTER — Telehealth: Payer: Self-pay | Admitting: Oncology

## 2018-12-06 NOTE — Telephone Encounter (Signed)
[facility] off day 5/29. Treatment patient moved to [facility]. Confirmed time change with patient. Other appointments remain the same.

## 2018-12-08 ENCOUNTER — Telehealth: Payer: Self-pay | Admitting: Oncology

## 2018-12-08 NOTE — Telephone Encounter (Signed)
Scheduled [facility]ex genetics appointment per 5/13 schedule message. Genetics appointments are being performed via webex at this time.   Left message for patient. Other appointments remain the same. Confirmed next appointment 5/28.

## 2018-12-10 ENCOUNTER — Other Ambulatory Visit: Payer: Self-pay | Admitting: Nurse Practitioner

## 2018-12-10 DIAGNOSIS — I10 Essential (primary) hypertension: Secondary | ICD-10-CM

## 2018-12-11 ENCOUNTER — Telehealth: Payer: Self-pay | Admitting: Licensed Clinical Social Worker

## 2018-12-11 ENCOUNTER — Telehealth: Payer: Self-pay

## 2018-12-11 ENCOUNTER — Other Ambulatory Visit: Payer: Self-pay | Admitting: Adult Health

## 2018-12-11 DIAGNOSIS — H04553 Acquired stenosis of bilateral nasolacrimal duct: Secondary | ICD-10-CM

## 2018-12-11 MED ORDER — LIFITEGRAST 5 % OP SOLN: 1.0000 [drp] | Freq: Two times a day (BID) | OPHTHALMIC | 0 refills | Status: DC

## 2018-12-11 NOTE — Progress Notes (Signed)
Received denial for restasis eye drops from [place], our financial navigator.  Was recommended to place order for Xiidra eye drops instead.  Order placed after reviewing information in up to date, on administration details.    Wilber Bihari, NP

## 2018-12-11 NOTE — Telephone Encounter (Signed)
LVM for patient informing that restasis was denied by insurance and that NP sent in xiidra as a replacement. Informed patient to continue using systane.  If any questions or concerns left center number for call back.

## 2018-12-17 ENCOUNTER — Other Ambulatory Visit: Payer: Self-pay

## 2018-12-17 ENCOUNTER — Encounter (HOSPITAL_COMMUNITY): Payer: Self-pay

## 2018-12-17 ENCOUNTER — Ambulatory Visit (HOSPITAL_COMMUNITY)
Admission: EM | Admit: 2018-12-17 | Discharge: 2018-12-18 | Disposition: A | Discharge status: Home / Self Care | Payer: BLUE CROSS/BLUE SHIELD | Attending: Emergency Medicine | Admitting: Emergency Medicine

## 2018-12-17 DIAGNOSIS — I10 Essential (primary) hypertension: Secondary | ICD-10-CM | POA: Insufficient documentation

## 2018-12-17 DIAGNOSIS — D649 Anemia, unspecified: Secondary | ICD-10-CM | POA: Insufficient documentation

## 2018-12-17 DIAGNOSIS — Z1159 Encounter for screening for other viral diseases: Secondary | ICD-10-CM | POA: Diagnosis not present

## 2018-12-17 DIAGNOSIS — H4089 Other specified glaucoma: Secondary | ICD-10-CM | POA: Diagnosis not present

## 2018-12-17 DIAGNOSIS — Z9012 Acquired absence of left breast and nipple: Secondary | ICD-10-CM | POA: Diagnosis not present

## 2018-12-17 DIAGNOSIS — H40052 Ocular hypertension, left eye: Secondary | ICD-10-CM

## 2018-12-17 DIAGNOSIS — H5712 Ocular pain, left eye: Secondary | ICD-10-CM

## 2018-12-17 DIAGNOSIS — Z79899 Other long term (current) drug therapy: Secondary | ICD-10-CM | POA: Diagnosis not present

## 2018-12-17 DIAGNOSIS — Z86010 Personal history of colonic polyps: Secondary | ICD-10-CM | POA: Insufficient documentation

## 2018-12-17 DIAGNOSIS — H40832 Aqueous misdirection, left eye: Secondary | ICD-10-CM | POA: Diagnosis not present

## 2018-12-17 DIAGNOSIS — Z853 Personal history of malignant neoplasm of breast: Secondary | ICD-10-CM | POA: Insufficient documentation

## 2018-12-17 MED ORDER — BRIMONIDINE TARTRATE 0.15 % OP SOLN
1.0000 [drp] | Freq: Once | OPHTHALMIC | Status: AC
  Administered 2018-12-18 (×2): 1 [drp] via OPHTHALMIC
  Filled 2018-12-17: qty 5

## 2018-12-17 MED ORDER — PILOCARPINE HCL 2 % OP SOLN
1.0000 [drp] | Freq: Once | OPHTHALMIC | Status: AC
  Administered 2018-12-17: 1 [drp] via OPHTHALMIC
  Filled 2018-12-17: qty 15

## 2018-12-17 MED ORDER — MANNITOL 25 % IV SOLN
1.0000 g/kg | Freq: Once | INTRAVENOUS | Status: AC
  Administered 2018-12-18 (×2): 12.5 g via INTRAVENOUS
  Filled 2018-12-17: qty 281.2

## 2018-12-17 MED ORDER — TETRACAINE HCL 0.5 % OP SOLN
2.0000 [drp] | Freq: Once | OPHTHALMIC | Status: AC
  Administered 2018-12-17: 21:00:00 2 [drp] via OPHTHALMIC
  Filled 2018-12-17: qty 4

## 2018-12-17 MED ORDER — FLUORESCEIN SODIUM 1 MG OP STRP
1.0000 | ORAL_STRIP | Freq: Once | OPHTHALMIC | Status: AC
  Administered 2018-12-17: 21:00:00 1 via OPHTHALMIC
  Filled 2018-12-17: qty 1

## 2018-12-17 MED ORDER — DORZOLAMIDE HCL-TIMOLOL MAL 2-0.5 % OP SOLN
1.0000 [drp] | Freq: Two times a day (BID) | OPHTHALMIC | Status: DC
  Administered 2018-12-18 (×2): 1 [drp] via OPHTHALMIC
  Filled 2018-12-17: qty 10

## 2018-12-17 NOTE — ED Triage Notes (Addendum)
Pt reports that her L eye has been hurting since she woke up. Her L eye is red and swollen. She states that she scratched it a long time ago and it occasionally flairs up and requires prescription ointment. A&Ox4. Denies purulent drainage, fever, or other symptoms. Vision is blurred in the affected eye.

## 2018-12-17 NOTE — ED Provider Notes (Signed)
[facility] DEPT Provider Note   CSN: [id number] Arrival date & time: 12/17/18  2033    History   Chief Complaint Chief Complaint  Patient presents with  . Eye Pain    R    HPI Paige Foster is a 53 y.o. female past with history of hypertension, recurrent breast cancer, colon polyps who presents for evaluation of left eye pain, redness, blurry vision that began today.  Patient states that "I a while back I got a scratch in the eye and sometimes it gives me issues and I think it is causing the trouble."  She denies any new scratch, trauma to the eye.  She states she has a cataract noted in that eye and states that because of her breast cancer and chemo treatments, they have opted not to treat the cataract at this time.  Patient states that because of the cataract, she has some blurry vision in that eye all the time.  She states that it got worse today.  She has also noticed some erythema of the eye.  She states that the eye has had some drainage.  She reports she is seen [facility] previously.  She was given some Restasis eyedrops to help with symptoms previously and was recently switched to [place] She has not noted any fevers, nausea/vomiting.  She wears glasses but not contacts.     The history is provided by the patient.    Past Medical History:  Diagnosis Date  . History of colon polyps   . Hypertension   . Recurrent breast cancer, left [facility]) oncologist-- dr Jana Hakim    dx 2004, noninvasive Stage 0 ----s/p left mastectomy w/ tram flap construction (and right breast reduction), taken Tamoxifen for 3 yrs;   08-30-2018 recurrent left cancer , Grade III,  cT1c,  ER positive, PR negative, HER-2 positive, invasive ductal carcinoma-- neoadjuvant chemo to start 09-26-2018  . Renal artery stenosis (HCC)    mild right external renal artery stenosis per duplex in epic 08-09-2013  . Wears glasses     Patient Active Problem List   Diagnosis  Date Noted  . Port-A-Cath in place 09/28/2018  . Recurrent breast cancer, left ([place]) 09/07/2018  . Malignant neoplasm of upper-outer quadrant of left breast in female, estrogen receptor positive ([place]) 09/06/2018  . Dyspepsia 11/25/2014  . HTN (hypertension) 07/10/2013  . Annual physical exam 05/26/2012  . HEMORRHOIDS-INTERNAL 04/23/2009  . PERSONAL HX COLONIC POLYPS 04/23/2009  . HEMORRHOID, THROMBOSED 03/12/2009    Past Surgical History:  Procedure Laterality Date  . COLONOSCOPY    . MASTECTOMY Left 2004   w/  TRAM flap construction and right breast augmentation with abdominoplasy  . PORTACATH PLACEMENT N/A 09/25/2018   Procedure: INSERTION PORT-A-CATH WITH ULTRASOUND;  Surgeon: Rolm Bookbinder, MD;  Location: [facility] ORS;  Service: General;  Laterality: N/A;  . TUBAL LIGATION Bilateral yrs ago     OB History   No obstetric history on file.      Home Medications    Prior to Admission medications   Medication Sig Start Date End Date Taking? Authorizing Provider  amLODipine (NORVASC) 10 MG tablet Take 1 tablet (10 mg total) by mouth at bedtime. 08/30/18   Nche, Charlene Brooke, NP  dexamethasone (DECADRON) 4 MG tablet Take 2 tablets (8 mg total) by mouth 2 (two) times daily. Start the day before Taxotere. Take once the day after, then 2 times a day x 2d. 09/18/18   Magrinat, Virgie Dad, MD  Flaxseed, Linseed, (FLAXSEED OIL) 1000 MG CAPS Take 1,000 mg by mouth daily.    [provider]  lidocaine-prilocaine (EMLA) cream Apply to affected area once 09/18/18   Magrinat, Virgie Dad, MD  Lifitegrast Shirley Friar) 5 % SOLN Apply 1 drop to eye every 12 (twelve) hours. 12/11/18   Gardenia Phlegm, NP  LORazepam (ATIVAN) 0.5 MG tablet TAKE 1 TABLET(0.5 MG) BY MOUTH AT BEDTIME AS NEEDED FOR NAUSEA OR VOMITING 11/30/18   Magrinat, Virgie Dad, MD  losartan (COZAAR) 50 MG tablet TAKE 1 TABLET(50 MG) BY MOUTH DAILY 12/10/18   Nche, Charlene Brooke, NP  magic mouthwash SOLN Take 5 mLs by mouth 3 (three)  times daily. 12/01/18   Gardenia Phlegm, NP  Nutritional Supplements (GRAPESEED EXTRACT PO) Take 1 capsule by mouth daily.    [provider]  prochlorperazine (COMPAZINE) 10 MG tablet Take 1 tablet (10 mg total) by mouth every 6 (six) hours as needed (Nausea or vomiting). 09/18/18   Magrinat, Virgie Dad, MD  Resveratrol 250 MG CAPS Take 250 mg by mouth daily.    [provider]  traMADol (ULTRAM) 50 MG tablet Take 2 tablets (100 mg total) by mouth every 6 (six) hours as needed. 09/25/18   Rolm Bookbinder, MD  TURMERIC PO Take 1,000 mg by mouth daily.    [provider]  valACYclovir (VALTREX) 500 MG tablet Take 1 tablet (500 mg total) by mouth 2 (two) times daily. 12/01/18   Gardenia Phlegm, NP    Family History Family History  Problem Relation Age of Onset  . Diabetes Mother   . Hypertension Mother   . Kidney disease Father   . Colon cancer Neg Hx   . Colon polyps Neg Hx   . Gallbladder disease Neg Hx   . Heart disease Neg Hx   . Esophageal cancer Neg Hx     Social History Social History   Tobacco Use  . Smoking status: Never Smoker  . Smokeless tobacco: Never Used  Substance Use Topics  . Alcohol use: Not Currently    Alcohol/week: 0.0 standard drinks    Comment: Occassionally  . Drug use: No     Allergies   Patient has no known allergies.   Review of Systems Review of Systems  Constitutional: Negative for fever.  Eyes: Positive for photophobia, pain, discharge, redness and visual disturbance.  Gastrointestinal: Negative for nausea and vomiting.  All other systems reviewed and are negative.    Physical Exam Updated Vital Signs BP (!) 142/91   Pulse 85   Temp 99.2 F (37.3 C) (Oral)   Resp (!) 25   Ht '5\' 4"'$  (1.626 m)   Wt 70.3 kg   LMP 06/08/2007 Comment: Just stopped having period  SpO2 96%   BMI 26.61 kg/m   Physical Exam Vitals signs and nursing note reviewed.  Constitutional:      Appearance: She is  well-developed.  HENT:     Head: Normocephalic and atraumatic.  Eyes:     General: No scleral icterus.       Right eye: No discharge.        Left eye: No discharge.     Conjunctiva/sclera:     Left eye: Left conjunctiva is injected.     Comments: Right pupil is reactive to light.  Left pupil is fixed and dilated at about 4 mm.  She also has a cloudy appearance of the cornea.  Conjunctival injection with watery discharge noted to the left eye.  She has some slight erythema/edema noted to the upper eyelid on the left.  No periorbital warmth, erythema, tenderness.  EOMs intact without any difficulty.  Pulmonary:     Effort: Pulmonary effort is normal.  Skin:    General: Skin is warm and dry.  Neurological:     Mental Status: She is alert.  Psychiatric:        Speech: Speech normal.        Behavior: Behavior normal.      ED Treatments / Results  Labs (all labs ordered are listed, but only abnormal results are displayed) Labs Reviewed  COMPREHENSIVE METABOLIC PANEL - Abnormal; Notable for the following components:      Result Value   Potassium 3.1 (*)    Glucose, Bld 160 (*)    BUN 5 (*)    Total Bilirubin <0.1 (*)    All other components within normal limits  CBC WITH DIFFERENTIAL/PLATELET - Abnormal; Notable for the following components:   RBC 3.08 (*)    Hemoglobin 10.2 (*)    HCT 30.9 (*)    MCV 100.3 (*)    RDW 16.0 (*)    Neutro Abs 8.5 (*)    Abs Immature Granulocytes 0.14 (*)    All other components within normal limits    EKG None  Radiology No results found.  Procedures Procedures (including critical care time)  Medications Ordered in ED Medications  pilocarpine (PILOCAR) 2 % ophthalmic solution 1 drop (has no administration in time range)  dorzolamide-timolol (COSOPT) 22.3-6.8 MG/ML ophthalmic solution 1 drop (1 drop Left Eye Given 12/18/18 0120)  tetracaine (PONTOCAINE) 0.5 % ophthalmic solution 2 drop (2 drops Left Eye Given 12/17/18 2120)  fluorescein  ophthalmic strip 1 strip (1 strip Left Eye Given 12/17/18 2120)  mannitol 25 % injection 70.3 g (0 g/kg  70.3 kg Intravenous Stopped 12/18/18 0233)  brimonidine (ALPHAGAN) 0.15 % ophthalmic solution 1 drop (1 drop Left Eye Given 12/18/18 0120)  fluorescein ophthalmic strip 1 strip (1 strip Left Eye Given 12/18/18 0129)     Initial Impression / Assessment and Plan / ED Course  I have reviewed the triage vital signs and the nursing notes.  Pertinent labs & imaging results that were available during my care of the patient were reviewed by me and considered in my medical decision making (see chart for details).        53 year old female who presents for evaluation of left eye pain, redness, blurry vision that is been ongoing since this afternoon.  She reports she has had a cataract since March but states that because she is on chemo, they said they would not be able to do anything about it right now.  Patient states that this afternoon, I became significantly more painful, red, irritated as well as having some watery discharge.  She states she sees Forensic scientist.  She states that she called them and they had switched her Restasis eyedrops to [place]  Patient denies any trauma, injury to eye.  She denies any fevers. Patient is afebrile, non-toxic appearing, sitting comfortably on examination table. Vital signs reviewed and stable.  On exam, left eye is injected, erythematous.  Pupil is 4 mm and nonreactive. PaIn for visual acuity, evaluation with Woods lamp, evaluation of intraocular pressures.   Evaluation with Sherral Hammers lamp shows no evidence of fluorescein uptake, corneal abrasion.    Visual Acuity  Right Eye Distance: 20/25 Left Eye Distance: (Pt unable to read any letters on chart) Bilateral Distance: 20/20  Right Eye Near:   Left Eye Near:    Bilateral Near:      Intraocular pressure as documented below:  Left IOP: 56, 48, 55 Right IOP: 20, 22, 25  Will consult patient's  eye doctor.  [facility] [employer].  On-call ophthalmologist, Dr. Posey Pronto was accidentally paged.  He suggests attempting to call her eye doctor and if unable to reach him, call him back.  Asked Secretary to call [employer].  Have not heard back from [facility].   Discussed with Dr. Posey Pronto.  He recommends giving pilocarpine here in the ED.  Additionally, he wants to give IV mannitol.  He will come and evaluate patient in the ED.  I discussed with Dr. Posey Pronto after evaluation in the ED.  We will proceed with 1 bottle of mannitol and reevaluate her pressures.  Additionally, he would like to do Alphagan and timolol drops 1 drop every 15 min x4 doses.  He would like patient's pressures rechecked after mannitol to see if there is any improvement and to be contacted.  If no improvement after mannitol, patient may need to go to the OR for cataract surgery.  Patient signed out to Quincy Carnes, PA-C with reevaluation after mannitol and repeat pressures pending.  Portions of this note were generated with Lobbyist. Dictation errors may occur despite best attempts at proofreading.    Final Clinical Impressions(s) / ED Diagnoses   Final diagnoses:  Pain of left eye  Raised intraocular pressure of left eye    ED Discharge Orders    None       Volanda Napoleon, PA-C 12/18/18 0244    Daleen Bo, MD 12/18/18 1020

## 2018-12-17 NOTE — ED Notes (Signed)
Pt asked to complete the visual acuity screening and explained the importance of the test; pt refused the visual acuity screening and stated "I know what's wrong with my eye, I don't need that test".

## 2018-12-17 NOTE — ED Notes (Signed)
Drops and mannitol at bedside per PA request

## 2018-12-17 NOTE — ED Provider Notes (Signed)
  Face-to-face evaluation   History: This for evaluation of pain in her left eye, with somewhat decreased vision from baseline, since this morning.  He states it feels like her eye is scratched.  She has a cataract in left eye which is not currently being addressed because of active chemotherapy treatment for breast cancer.  Recently started on a medication for high watering, by her oncologist.  Physical exam: Alert, calm, cooperative.  Left eye is diffusely erythematous and tender to touch.  Visible cataract, and a slightly dilated pupil.  External ocular muscles are intact.  Mild upper lower lid swelling.  Clear left eye drainage.  Medical screening examination/treatment/procedure(s) were conducted as a shared visit with non-physician practitioner(s) and myself.  I personally evaluated the patient during the encounter    Daleen Bo, MD 12/18/18 1024

## 2018-12-18 ENCOUNTER — Emergency Department (HOSPITAL_COMMUNITY): Payer: BLUE CROSS/BLUE SHIELD | Admitting: Anesthesiology

## 2018-12-18 ENCOUNTER — Encounter (HOSPITAL_COMMUNITY)
Admission: EM | Disposition: A | Discharge status: Home / Self Care | Payer: Self-pay | Source: Home / Self Care | Attending: Emergency Medicine

## 2018-12-18 ENCOUNTER — Other Ambulatory Visit: Payer: Self-pay

## 2018-12-18 ENCOUNTER — Encounter (HOSPITAL_COMMUNITY): Payer: Self-pay | Admitting: Anesthesiology

## 2018-12-18 DIAGNOSIS — H40052 Ocular hypertension, left eye: Secondary | ICD-10-CM | POA: Diagnosis present

## 2018-12-18 DIAGNOSIS — H5712 Ocular pain, left eye: Secondary | ICD-10-CM | POA: Diagnosis present

## 2018-12-18 DIAGNOSIS — Z1159 Encounter for screening for other viral diseases: Secondary | ICD-10-CM | POA: Diagnosis not present

## 2018-12-18 DIAGNOSIS — Z853 Personal history of malignant neoplasm of breast: Secondary | ICD-10-CM | POA: Diagnosis not present

## 2018-12-18 DIAGNOSIS — H40832 Aqueous misdirection, left eye: Secondary | ICD-10-CM | POA: Diagnosis not present

## 2018-12-18 DIAGNOSIS — Z86010 Personal history of colonic polyps: Secondary | ICD-10-CM | POA: Diagnosis not present

## 2018-12-18 DIAGNOSIS — Z9012 Acquired absence of left breast and nipple: Secondary | ICD-10-CM | POA: Diagnosis not present

## 2018-12-18 DIAGNOSIS — I10 Essential (primary) hypertension: Secondary | ICD-10-CM | POA: Diagnosis not present

## 2018-12-18 DIAGNOSIS — D649 Anemia, unspecified: Secondary | ICD-10-CM | POA: Diagnosis not present

## 2018-12-18 DIAGNOSIS — Z79899 Other long term (current) drug therapy: Secondary | ICD-10-CM | POA: Diagnosis not present

## 2018-12-18 DIAGNOSIS — H4089 Other specified glaucoma: Secondary | ICD-10-CM | POA: Diagnosis not present

## 2018-12-18 HISTORY — PX: PARS PLANA VITRECTOMY: SHX2166

## 2018-12-18 LAB — COMPREHENSIVE METABOLIC PANEL
ALT: 15 U/L (ref 0–44)
AST: 16 U/L (ref 15–41)
Albumin: 3.9 g/dL (ref 3.5–5.0)
Alkaline Phosphatase: 110 U/L (ref 38–126)
Anion gap: 11 (ref 5–15)
BUN: 5 mg/dL — ABNORMAL LOW (ref 6–20)
CO2: 22 mmol/L (ref 22–32)
Calcium: 9.2 mg/dL (ref 8.9–10.3)
Chloride: 104 mmol/L (ref 98–111)
Creatinine, Ser: 0.52 mg/dL (ref 0.44–1.00)
GFR calc Af Amer: >60 mL/min (ref >60)
GFR calc non Af Amer: >60 mL/min (ref >60)
Glucose, Bld: 160 mg/dL — ABNORMAL HIGH (ref 70–99)
Potassium: 3.1 mmol/L — ABNORMAL LOW (ref 3.5–5.1)
Sodium: 137 mmol/L (ref 135–145)
Total Bilirubin: <0.1 mg/dL — ABNORMAL LOW (ref 0.3–1.2)
Total Protein: 7.2 g/dL (ref 6.5–8.1)

## 2018-12-18 LAB — CBC WITH DIFFERENTIAL/PLATELET
Abs Immature Granulocytes: 0.14 10*3/uL — ABNORMAL HIGH (ref 0.00–0.07)
Basophils Absolute: 0 10*3/uL (ref 0.0–0.1)
Basophils Relative: 0 %
Eosinophils Absolute: 0 10*3/uL (ref 0.0–0.5)
Eosinophils Relative: 0 %
HCT: 30.9 % — ABNORMAL LOW (ref 36.0–46.0)
Hemoglobin: 10.2 g/dL — ABNORMAL LOW (ref 12.0–15.0)
Immature Granulocytes: 1 %
Lymphocytes Relative: 14 %
Lymphs Abs: 1.5 10*3/uL (ref 0.7–4.0)
MCH: 33.1 pg (ref 26.0–34.0)
MCHC: 33 g/dL (ref 30.0–36.0)
MCV: 100.3 fL — ABNORMAL HIGH (ref 80.0–100.0)
Monocytes Absolute: 0.4 10*3/uL (ref 0.1–1.0)
Monocytes Relative: 3 %
Neutro Abs: 8.5 10*3/uL — ABNORMAL HIGH (ref 1.7–7.7)
Neutrophils Relative %: 82 %
Platelets: 290 10*3/uL (ref 150–400)
RBC: 3.08 MIL/uL — ABNORMAL LOW (ref 3.87–5.11)
RDW: 16 % — ABNORMAL HIGH (ref 11.5–15.5)
WBC: 10.5 10*3/uL (ref 4.0–10.5)
nRBC: 0 % (ref 0.0–0.2)

## 2018-12-18 LAB — SARS CORONAVIRUS 2 BY RT PCR (HOSPITAL ORDER, PERFORMED IN ~~LOC~~ HOSPITAL LAB): SARS Coronavirus 2: NEGATIVE

## 2018-12-18 SURGERY — PARS PLANA VITRECTOMY WITH 25 GAUGE
Anesthesia: General | Site: Eye | Laterality: Left

## 2018-12-18 MED ORDER — ATROPINE SULFATE 1 % OP SOLN
OPHTHALMIC | Status: AC
  Filled 2018-12-18: qty 5

## 2018-12-18 MED ORDER — OXYCODONE HCL 5 MG PO TABS
ORAL_TABLET | ORAL | Status: AC
  Filled 2018-12-18: qty 1

## 2018-12-18 MED ORDER — CYCLOPENTOLATE HCL 1 % OP SOLN
1.0000 [drp] | Freq: Once | OPHTHALMIC | Status: DC
  Filled 2018-12-18: qty 2

## 2018-12-18 MED ORDER — LIDOCAINE 2% (20 MG/ML) 5 ML SYRINGE
INTRAMUSCULAR | Status: DC | PRN
  Administered 2018-12-18: 20 mg via INTRAVENOUS

## 2018-12-18 MED ORDER — INDOCYANINE GREEN 25 MG IV SOLR
INTRAVENOUS | Status: AC
  Filled 2018-12-18: qty 25

## 2018-12-18 MED ORDER — FENTANYL CITRATE (PF) 100 MCG/2ML IJ SOLN: 25.0000 ug | INTRAMUSCULAR | Status: DC | PRN

## 2018-12-18 MED ORDER — EPINEPHRINE PF 1 MG/ML IJ SOLN
INTRAMUSCULAR | Status: AC
  Filled 2018-12-18: qty 1

## 2018-12-18 MED ORDER — CARBACHOL 0.01 % IO SOLN
INTRAOCULAR | Status: AC
  Filled 2018-12-18: qty 1.5

## 2018-12-18 MED ORDER — TOBRAMYCIN-DEXAMETHASONE 0.3-0.1 % OP OINT
TOPICAL_OINTMENT | OPHTHALMIC | Status: AC
  Filled 2018-12-18: qty 3.5

## 2018-12-18 MED ORDER — MORPHINE SULFATE (PF) 4 MG/ML IV SOLN
4.0000 mg | Freq: Once | INTRAVENOUS | Status: AC
  Administered 2018-12-18: 04:00:00 4 mg via INTRAVENOUS
  Filled 2018-12-18: qty 1

## 2018-12-18 MED ORDER — FLUORESCEIN SODIUM 1 MG OP STRP
1.0000 | ORAL_STRIP | Freq: Once | OPHTHALMIC | Status: AC
  Administered 2018-12-18: 01:00:00 1 via OPHTHALMIC
  Filled 2018-12-18: qty 1

## 2018-12-18 MED ORDER — BUPIVACAINE HCL (PF) 0.75 % IJ SOLN
INTRAMUSCULAR | Status: AC
  Filled 2018-12-18: qty 10

## 2018-12-18 MED ORDER — BSS IO SOLN
INTRAOCULAR | Status: AC
  Filled 2018-12-18: qty 30

## 2018-12-18 MED ORDER — TOBRAMYCIN-DEXAMETHASONE 0.3-0.1 % OP OINT
TOPICAL_OINTMENT | OPHTHALMIC | Status: DC | PRN
  Administered 2018-12-18: 1 via OPHTHALMIC

## 2018-12-18 MED ORDER — PROPOFOL 10 MG/ML IV BOLUS
INTRAVENOUS | Status: DC | PRN
  Administered 2018-12-18: 140 mg via INTRAVENOUS

## 2018-12-18 MED ORDER — ACETAMINOPHEN 10 MG/ML IV SOLN: 1000.0000 mg | Freq: Once | INTRAVENOUS | Status: DC | PRN

## 2018-12-18 MED ORDER — PROVISC 10 MG/ML IO SOLN
INTRAOCULAR | Status: DC | PRN
  Administered 2018-12-18: .85 mL via INTRAOCULAR

## 2018-12-18 MED ORDER — TETRACAINE HCL 0.5 % OP SOLN
OPHTHALMIC | Status: AC
  Filled 2018-12-18: qty 4

## 2018-12-18 MED ORDER — DEXAMETHASONE SODIUM PHOSPHATE 10 MG/ML IJ SOLN
INTRAMUSCULAR | Status: DC | PRN
  Administered 2018-12-18: 1 mL via INTRAVENOUS

## 2018-12-18 MED ORDER — EPINEPHRINE PF 1 MG/ML IJ SOLN
INTRAOCULAR | Status: DC | PRN
  Administered 2018-12-18: 08:00:00 15 mL

## 2018-12-18 MED ORDER — FENTANYL CITRATE (PF) 100 MCG/2ML IJ SOLN
INTRAMUSCULAR | Status: DC | PRN
  Administered 2018-12-18 (×2): 25 ug via INTRAVENOUS
  Administered 2018-12-18: 100 ug via INTRAVENOUS

## 2018-12-18 MED ORDER — FENTANYL CITRATE (PF) 250 MCG/5ML IJ SOLN
INTRAMUSCULAR | Status: AC
  Filled 2018-12-18: qty 5

## 2018-12-18 MED ORDER — ROCURONIUM BROMIDE 50 MG/5ML IV SOSY
PREFILLED_SYRINGE | INTRAVENOUS | Status: DC | PRN
  Administered 2018-12-18: 30 mg via INTRAVENOUS

## 2018-12-18 MED ORDER — ACETAMINOPHEN 160 MG/5ML PO SOLN: 1000.0000 mg | Freq: Once | ORAL | Status: AC | PRN

## 2018-12-18 MED ORDER — OXYCODONE HCL 5 MG/5ML PO SOLN: 5.0000 mg | Freq: Once | ORAL | Status: AC | PRN

## 2018-12-18 MED ORDER — PHENYLEPHRINE HCL 10 % OP SOLN
1.0000 [drp] | Freq: Once | OPHTHALMIC | Status: DC
  Filled 2018-12-18: qty 5

## 2018-12-18 MED ORDER — LIDOCAINE HCL 2 % IJ SOLN
INTRAMUSCULAR | Status: AC
  Filled 2018-12-18: qty 20

## 2018-12-18 MED ORDER — BSS IO SOLN
INTRAOCULAR | Status: DC | PRN
  Administered 2018-12-18 (×2): 15 mL via INTRAOCULAR

## 2018-12-18 MED ORDER — SODIUM CHLORIDE 0.9 % IV SOLN
INTRAVENOUS | Status: DC | PRN
  Administered 2018-12-18: 25 ug/min via INTRAVENOUS

## 2018-12-18 MED ORDER — ACETAMINOPHEN 500 MG PO TABS
1000.0000 mg | ORAL_TABLET | Freq: Once | ORAL | Status: AC | PRN
  Administered 2018-12-18: 09:00:00 1000 mg via ORAL

## 2018-12-18 MED ORDER — PHENYLEPHRINE HCL 2.5 % OP SOLN
1.0000 [drp] | Freq: Once | OPHTHALMIC | Status: DC
  Filled 2018-12-18: qty 2

## 2018-12-18 MED ORDER — CEFAZOLIN SUBCONJUNCTIVAL INJECTION 100 MG/0.5 ML
100.0000 mg | INJECTION | SUBCONJUNCTIVAL | Status: DC
  Filled 2018-12-18: qty 5

## 2018-12-18 MED ORDER — LACTATED RINGERS IV SOLN
INTRAVENOUS | Status: DC | PRN
  Administered 2018-12-18: 06:00:00 via INTRAVENOUS

## 2018-12-18 MED ORDER — MIDAZOLAM HCL 5 MG/5ML IJ SOLN
INTRAMUSCULAR | Status: DC | PRN
  Administered 2018-12-18: 1 mg via INTRAVENOUS

## 2018-12-18 MED ORDER — SUGAMMADEX SODIUM 200 MG/2ML IV SOLN
INTRAVENOUS | Status: DC | PRN
  Administered 2018-12-18: 140.6 mg via INTRAVENOUS

## 2018-12-18 MED ORDER — SODIUM HYALURONATE 10 MG/ML IO SOLN
INTRAOCULAR | Status: AC
  Filled 2018-12-18: qty 0.85

## 2018-12-18 MED ORDER — ATROPINE SULFATE 1 % OP SOLN: OPHTHALMIC | Status: DC | PRN

## 2018-12-18 MED ORDER — HYALURONIDASE HUMAN 150 UNIT/ML IJ SOLN
INTRAMUSCULAR | Status: AC
  Filled 2018-12-18: qty 1

## 2018-12-18 MED ORDER — HYPROMELLOSE (GONIOSCOPIC) 2.5 % OP SOLN
OPHTHALMIC | Status: AC
  Filled 2018-12-18: qty 15

## 2018-12-18 MED ORDER — DEXAMETHASONE SODIUM PHOSPHATE 10 MG/ML IJ SOLN
INTRAMUSCULAR | Status: AC
  Filled 2018-12-18: qty 1

## 2018-12-18 MED ORDER — HYPROMELLOSE (GONIOSCOPIC) 2.5 % OP SOLN
OPHTHALMIC | Status: DC | PRN
  Administered 2018-12-18: 1 [drp] via OPHTHALMIC

## 2018-12-18 MED ORDER — TRIAMCINOLONE ACETONIDE 40 MG/ML IJ SUSP
INTRAMUSCULAR | Status: DC | PRN
  Administered 2018-12-18: .2 mL

## 2018-12-18 MED ORDER — NA CHONDROIT SULF-NA HYALURON 40-30 MG/ML IO SOLN
INTRAOCULAR | Status: AC
  Filled 2018-12-18: qty 0.5

## 2018-12-18 MED ORDER — BSS IO SOLN
INTRAOCULAR | Status: AC
  Filled 2018-12-18: qty 15

## 2018-12-18 MED ORDER — ONDANSETRON HCL 4 MG/2ML IJ SOLN
INTRAMUSCULAR | Status: DC | PRN
  Administered 2018-12-18: 4 mg via INTRAVENOUS

## 2018-12-18 MED ORDER — CARBACHOL 0.01 % IO SOLN
INTRAOCULAR | Status: DC | PRN
  Administered 2018-12-18: 0.5 mL via INTRAOCULAR

## 2018-12-18 MED ORDER — NA CHONDROIT SULF-NA HYALURON 40-30 MG/ML IO SOLN
INTRAOCULAR | Status: DC | PRN
  Administered 2018-12-18: 0.5 mL via INTRAOCULAR

## 2018-12-18 MED ORDER — ACETAMINOPHEN 500 MG PO TABS
ORAL_TABLET | ORAL | Status: AC
  Filled 2018-12-18: qty 2

## 2018-12-18 MED ORDER — CEFAZOLIN 200 MG/ML FOR DIALYSIS: INJECTION | INTRAOCULAR | Status: DC | PRN

## 2018-12-18 MED ORDER — BSS PLUS IO SOLN
INTRAOCULAR | Status: AC
  Filled 2018-12-18: qty 500

## 2018-12-18 MED ORDER — DEXAMETHASONE SODIUM PHOSPHATE 10 MG/ML IJ SOLN
INTRAMUSCULAR | Status: DC | PRN
  Administered 2018-12-18: 5 mg via INTRAVENOUS

## 2018-12-18 MED ORDER — TRIAMCINOLONE ACETONIDE 40 MG/ML IJ SUSP
INTRAMUSCULAR | Status: AC
  Filled 2018-12-18: qty 5

## 2018-12-18 MED ORDER — LIDOCAINE HCL 2 % IJ SOLN
INTRAMUSCULAR | Status: DC | PRN
  Administered 2018-12-18: 08:00:00 via RETROBULBAR

## 2018-12-18 MED ORDER — SUCCINYLCHOLINE CHLORIDE 200 MG/10ML IV SOSY
PREFILLED_SYRINGE | INTRAVENOUS | Status: DC | PRN
  Administered 2018-12-18: 80 mg via INTRAVENOUS

## 2018-12-18 MED ORDER — CEFAZOLIN SUBCONJUNCTIVAL INJECTION 100 MG/0.5 ML
INJECTION | SUBCONJUNCTIVAL | Status: DC | PRN
  Administered 2018-12-18: 100 mg via SUBCONJUNCTIVAL

## 2018-12-18 MED ORDER — OXYCODONE HCL 5 MG PO TABS
5.0000 mg | ORAL_TABLET | Freq: Once | ORAL | Status: AC | PRN
  Administered 2018-12-18: 09:00:00 5 mg via ORAL

## 2018-12-18 MED ORDER — HYALURONIDASE HUMAN NICU 150 UNIT/ML INJECTION
INTRAMUSCULAR | Status: DC | PRN
  Administered 2018-12-18: 150 [IU] via SUBCUTANEOUS

## 2018-12-18 MED ORDER — PROPOFOL 10 MG/ML IV BOLUS
INTRAVENOUS | Status: AC
  Filled 2018-12-18: qty 20

## 2018-12-18 MED ORDER — MIDAZOLAM HCL 2 MG/2ML IJ SOLN
INTRAMUSCULAR | Status: AC
  Filled 2018-12-18: qty 2

## 2018-12-18 SURGICAL SUPPLY — 70 items
APL SWBSTK 6 STRL LF DISP (MISCELLANEOUS) ×1
APPLICATOR COTTON TIP 6 STRL (MISCELLANEOUS) ×1 IMPLANT
APPLICATOR COTTON TIP 6IN STRL (MISCELLANEOUS) ×3
BLADE MVR KNIFE 20G (BLADE) ×2 IMPLANT
BLADE STAB KNIFE 15DEG (BLADE) ×2 IMPLANT
CANNULA ANT CHAM MAIN (OPHTHALMIC RELATED) IMPLANT
CANNULA DUAL BORE 23G (CANNULA) IMPLANT
CANNULA DUALBORE 25G (CANNULA) IMPLANT
CANNULA VLV SOFT TIP 25G (OPHTHALMIC) ×1 IMPLANT
CANNULA VLV SOFT TIP 25GA (OPHTHALMIC) ×3 IMPLANT
CAUTERY EYE LOW TEMP 1300F FIN (OPHTHALMIC RELATED) IMPLANT
CLOSURE STERI-STRIP 1/2X4 (GAUZE/BANDAGES/DRESSINGS) ×1
CLSR STERI-STRIP ANTIMIC 1/2X4 (GAUZE/BANDAGES/DRESSINGS) ×2 IMPLANT
COVER MAYO STAND STRL (DRAPES) IMPLANT
DRAPE HALF SHEET 40X57 (DRAPES) ×3 IMPLANT
DRAPE INCISE 51X51 W/FILM STRL (DRAPES) IMPLANT
DRAPE RETRACTOR (MISCELLANEOUS) ×3 IMPLANT
ERASER HMR WETFIELD 23G BP (MISCELLANEOUS) IMPLANT
FORCEPS ECKARDT ILM 25G SERR (OPHTHALMIC RELATED) IMPLANT
FORCEPS GRIESHABER ILM 25G A (INSTRUMENTS) IMPLANT
GAS AUTO FILL CONSTEL (OPHTHALMIC)
GAS AUTO FILL CONSTELLATION (OPHTHALMIC) IMPLANT
GLOVE BIOGEL PI IND STRL 7.0 (GLOVE) IMPLANT
GLOVE BIOGEL PI INDICATOR 7.0 (GLOVE) ×2
GLOVE ECLIPSE 7.5 STRL STRAW (GLOVE) ×5 IMPLANT
GLOVE SURG SS PI 6.0 STRL IVOR (GLOVE) ×2 IMPLANT
GLOVE SURG SS PI 7.0 STRL IVOR (GLOVE) ×2 IMPLANT
GOWN STRL REUS W/ TWL LRG LVL3 (GOWN DISPOSABLE) ×1 IMPLANT
GOWN STRL REUS W/TWL LRG LVL3 (GOWN DISPOSABLE) ×3
KIT BASIN OR (CUSTOM PROCEDURE TRAY) ×3 IMPLANT
KIT TURNOVER KIT B (KITS) ×3 IMPLANT
LENS BIOM SUPER VIEW SET DISP (OPHTHALMIC RELATED) ×3 IMPLANT
MICROPICK 25G (MISCELLANEOUS)
NDL 18GX1X1/2 (RX/OR ONLY) (NEEDLE) ×1 IMPLANT
NDL 25GX 5/8IN NON SAFETY (NEEDLE) ×1 IMPLANT
NDL FILTER BLUNT 18X1 1/2 (NEEDLE) ×1 IMPLANT
NDL HYPO 25GX1X1/2 BEV (NEEDLE) IMPLANT
NDL HYPO 30X.5 LL (NEEDLE) ×2 IMPLANT
NDL RETROBULBAR 25GX1.5 (NEEDLE) ×1 IMPLANT
NEEDLE 18GX1X1/2 (RX/OR ONLY) (NEEDLE) ×9 IMPLANT
NEEDLE 25GX 5/8IN NON SAFETY (NEEDLE) ×3 IMPLANT
NEEDLE FILTER BLUNT 18X 1/2SAF (NEEDLE) ×2
NEEDLE FILTER BLUNT 18X1 1/2 (NEEDLE) ×1 IMPLANT
NEEDLE HYPO 25GX1X1/2 BEV (NEEDLE) IMPLANT
NEEDLE HYPO 30X.5 LL (NEEDLE) ×6 IMPLANT
NEEDLE RETROBULBAR 25GX1.5 (NEEDLE) ×3 IMPLANT
NS IRRIG 1000ML POUR BTL (IV SOLUTION) ×3 IMPLANT
PACK FRAGMATOME (OPHTHALMIC) ×2 IMPLANT
PACK VITRECTOMY CUSTOM (CUSTOM PROCEDURE TRAY) ×3 IMPLANT
PAD ARMBOARD 7.5X6 YLW CONV (MISCELLANEOUS) ×6 IMPLANT
PAK PIK VITRECTOMY CVS 25GA (OPHTHALMIC) ×3 IMPLANT
PAK VITRECTOMY PIK 25 GA (OPHTHALMIC RELATED) ×2 IMPLANT
PENCIL BIPOLAR 25GA STR DISP (OPHTHALMIC RELATED) IMPLANT
PICK MICROPICK 25G (MISCELLANEOUS) IMPLANT
PROBE LASER ILLUM FLEX CVD 23G (OPHTHALMIC) ×2 IMPLANT
PROBE LASER ILLUM FLEX CVD 25G (OPHTHALMIC) ×2 IMPLANT
ROLLS DENTAL (MISCELLANEOUS) IMPLANT
SCRAPER DIAMOND 25GA (OPHTHALMIC RELATED) IMPLANT
SOLUTION ANTI FOG 6CC (MISCELLANEOUS) ×3 IMPLANT
STOPCOCK 4 WAY LG BORE MALE ST (IV SETS) IMPLANT
SUT VICRYL 7 0 TG140 8 (SUTURE) ×3 IMPLANT
SUT VICRYL 8 0 TG140 8 (SUTURE) ×2 IMPLANT
SYR 10ML LL (SYRINGE) IMPLANT
SYR 20CC LL (SYRINGE) ×3 IMPLANT
SYR 5ML LL (SYRINGE) ×2 IMPLANT
SYR TB 1ML LUER SLIP (SYRINGE) IMPLANT
SYRINGE 20CC LL (MISCELLANEOUS) ×1 IMPLANT
TIP ABS 45DEG FLARED 0.9MM (TIP) ×2 IMPLANT
WATER STERILE IRR 1000ML POUR (IV SOLUTION) ×3 IMPLANT
WIPE INSTRUMENT VISIWIPE 73X73 (MISCELLANEOUS) IMPLANT

## 2018-12-18 NOTE — ED Notes (Signed)
Bed: MA00 Expected date:  Expected time:  Means of arrival:  Comments: Room 7

## 2018-12-18 NOTE — H&P (Signed)
  Date of examination:  12/18/18  Indication for surgery: Aqueous misdirection, phacomorphic glaucoma left eye  Pertinent past medical history:  Past Medical History:  Diagnosis Date  . History of colon polyps   . Hypertension   . Recurrent breast cancer, left [facility]) oncologist-- dr Jana Hakim    dx 2004, noninvasive Stage 0 ----s/p left mastectomy w/ tram flap construction (and right breast reduction), taken Tamoxifen for 3 yrs;   08-30-2018 recurrent left cancer , Grade III,  cT1c,  ER positive, PR negative, HER-2 positive, invasive ductal carcinoma-- neoadjuvant chemo to start 09-26-2018  . Renal artery stenosis (HCC)    mild right external renal artery stenosis per duplex in epic 08-09-2013  . Wears glasses     Pertinent ocular history:  Left eye hypermature cataract  Pertinent family history:  Family History  Problem Relation Age of Onset  . Diabetes Mother   . Hypertension Mother   . Kidney disease Father   . Colon cancer Neg Hx   . Colon polyps Neg Hx   . Gallbladder disease Neg Hx   . Heart disease Neg Hx   . Esophageal cancer Neg Hx     General:  Healthy appearing patient with left eye pain  Eyes:    Acuity  OS HM  External: periorbital edema left eye  Anterior segment: shallow OS  Fundus: No view due to cataract      Impression: Aqueous misdirection, phacomorphic glaucoma left eye  Plan: Pars plana vitrectomy with endolaser and pars plana lensectomy left eye  Royston Cowper

## 2018-12-18 NOTE — ED Notes (Signed)
[facility] dispatch notified for need of transport.  

## 2018-12-18 NOTE — Discharge Instructions (Signed)
DO NOT SLEEP ON BACK, THE EYE PRESSURE CAN GO UP AND CAUSE VISION LOSS   SLEEP ON SIDE WITH NOSE TO PILLOW  DURING DAY KEEP UPRIGHT 

## 2018-12-18 NOTE — Brief Op Note (Signed)
12/18/2018  8:40 AM  PATIENT:  Paige Foster  53 y.o. 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   Delay start of Pharmacological VTE agent (>24hrs) due to surgical blood loss or risk of bleeding: not applicable

## 2018-12-18 NOTE — ED Notes (Signed)
2nd round of drops given in left eye

## 2018-12-18 NOTE — ED Notes (Signed)
Eyedrops given every 15 minutes per PA order

## 2018-12-18 NOTE — Transfer of Care (Signed)
Immediate Anesthesia Transfer of Care Note  Patient: Paige Foster  Procedure(s) Performed: PARS PLANA VITRECTOMY WITH 25 GAUGE, ENDOLASER (Left Eye)  Patient Location: PACU  Anesthesia Type:General  Level of Consciousness: awake, alert  and sedated  Airway & Oxygen Therapy: Patient connected to face mask oxygen  Post-op Assessment: Post -op Vital signs reviewed and stable  Post vital signs: stable  Last Vitals:  Vitals Value Taken Time  BP 128/79 12/18/2018  8:51 AM  Temp    Pulse 91 12/18/2018  8:52 AM  Resp 23 12/18/2018  8:52 AM  SpO2 99 % 12/18/2018  8:52 AM  Vitals shown include unvalidated device data.  Last Pain:  Vitals:   12/18/18 0453  TempSrc:   PainSc: 9          Complications: No apparent anesthesia complications

## 2018-12-18 NOTE — Anesthesia Preprocedure Evaluation (Addendum)
Anesthesia Evaluation  Patient identified by MRN, date of birth, ID band Patient awake    Reviewed: Allergy & Precautions, NPO status , Patient's Chart, lab work & pertinent test results  History of Anesthesia Complications Negative for: history of anesthetic complications  Airway Mallampati: II  TM Distance: >3 FB Neck ROM: Full    Dental  (+) Teeth Intact, Dental Advisory Given   Pulmonary neg pulmonary ROS,    breath sounds clear to auscultation       Cardiovascular hypertension,  Rhythm:Regular     Neuro/Psych negative neurological ROS  negative psych ROS   GI/Hepatic negative GI ROS, Neg liver ROS,   Endo/Other  negative endocrine ROS  Renal/GU Renal diseaseRenal artery stenosis      Musculoskeletal negative musculoskeletal ROS (+)   Abdominal   Peds  Hematology  (+) anemia ,   Anesthesia Other Findings Recent diagnosis of breast cancer and chemotherapy   Reproductive/Obstetrics                            Anesthesia Physical Anesthesia Plan  ASA: II  Anesthesia Plan: General   Post-op Pain Management:    Induction: Intravenous  PONV Risk Score and Plan: 3 and Ondansetron and Dexamethasone  Airway Management Planned: LMA and Oral ETT  Additional Equipment: None  Intra-op Plan:   Post-operative Plan: Extubation in OR  Informed Consent: I have reviewed the patients History and Physical, chart, labs and discussed the procedure including the risks, benefits and alternatives for the proposed anesthesia with the patient or authorized representative who has indicated his/her understanding and acceptance.     Dental advisory given  Plan Discussed with: CRNA and Surgeon  Anesthesia Plan Comments:         Anesthesia Quick Evaluation

## 2018-12-18 NOTE — ED Notes (Signed)
4th round of eye drops administered into Left eye

## 2018-12-18 NOTE — Anesthesia Procedure Notes (Signed)
Procedure Name: Intubation Date/Time: 12/18/2018 6:50 AM Performed by: Lavell Luster, CRNA Pre-anesthesia Checklist: Patient identified, Emergency Drugs available, Suction available, Patient being monitored and Timeout performed Patient Re-evaluated:Patient Re-evaluated prior to induction Oxygen Delivery Method: Circle system utilized Preoxygenation: Pre-oxygenation with 100% oxygen Induction Type: IV induction and Rapid sequence Laryngoscope Size: Mac and 3 Grade View: Grade I Tube type: Oral Tube size: 7.0 mm Number of attempts: 1 Airway Equipment and Method: Stylet Placement Confirmation: ETT inserted through vocal cords under direct vision,  positive ETCO2 and breath sounds checked- equal and bilateral Secured at: 21 cm Tube secured with: Tape Dental Injury: Teeth and Oropharynx as per pre-operative assessment

## 2018-12-18 NOTE — Anesthesia Postprocedure Evaluation (Signed)
Anesthesia Post Note  Patient: Paige Foster  Procedure(s) Performed: PARS PLANA VITRECTOMY WITH 25 GAUGE, ENDOLASER (Left Eye)     Patient location during evaluation: PACU Anesthesia Type: General Level of consciousness: sedated Pain management: pain level controlled Vital Signs Assessment: post-procedure vital signs reviewed and stable Respiratory status: spontaneous breathing and respiratory function stable Cardiovascular status: stable Postop Assessment: no apparent nausea or vomiting Anesthetic complications: no    Last Vitals:  Vitals:   12/18/18 0900 12/18/18 0906  BP: 126/76 127/84  Pulse: 88   Resp: (!) 25   Temp:    SpO2: 95%                   Helena Sardo DANIEL

## 2018-12-18 NOTE — Progress Notes (Signed)
Pt voided in pre-op, pt's husband updated via phone by pt, questions answered by C. Samanvi Cuccia, Therapist, sports. Pt's husband will need approx 20 minutes to return to [facility] once pt is discharged. Two belonging bags placed in PACU. Pt kept pants on.

## 2018-12-18 NOTE — ED Provider Notes (Signed)
Assumed care from PA Layden at shift change.  See prior notes for full H&P.  Briefly, 53 y.o. F here with left eye pain. She is followed by [facility].  Has had cataract since march, can't do anything about it until after finish chemo.  Has been having some blurred vision in left eye, but worsening today.  Eye red and painful, cloudy vision, mildly swollen.    Dr. Posey Pronto with optho has evaluated in ED-- getting IV mannitol and pilocarpine, Alphagan, and Cosopt drops.  Check IOP after each bottle.  Goal IOP 30.  Pupils around 44mm right now, wants smaller.  Monitor pain levels. Cell [phone]  2:34 AM Patient has had all drops ordered and first bottle of mannitol.  Pupils still about 58mm, reports pain is about the same.  IOP on recheck is 41.  Labs reassuring.  2:55 AM Discussed with Dr. Posey Pronto-- recommends another round of drops, another bottle of mannitol.  Re-check pressure afterwards.  May need OR in the morning for permanent fix.  COVID screen sent.  0400-- Difficulties with IV mannitol as patient as filter tubing getting clogged frequently and requiring re-start.  Feeling mildly better after some IV pain meds.  Will continue to monitor, pressure re-check shortly.  4:48 AM Repeat IOP 45.  Patient states pain is back to how it was earlier, morphine provided only minimally relief.  Pupil size is unchanged.  Discussed with Dr. Posey Pronto-- he will take to OR this morning at [facility], goal time of 6:30am.  Informed patient of need for intervention this morning, she is relived and would prefer [place] to arrange transport.  Charge RN and ED attending, Dr. Leonette Monarch made aware of transfer.  [facility] contacted.  COVID screen is negative.  CRITICAL CARE Performed by: Larene Pickett   Total critical care time: 40 minutes  Critical care time was exclusive of separately billable procedures and treating other patients.  Critical care was necessary to treat or prevent imminent or  life-threatening deterioration.  Critical care was time spent personally by me on the following activities: development of treatment plan with patient and/or surrogate as well as nursing, discussions with consultants, evaluation of patient's response to treatment, examination of patient, obtaining history from patient or surrogate, ordering and performing treatments and interventions, ordering and review of laboratory studies, ordering and review of radiographic studies, pulse oximetry and re-evaluation of patient's condition.    Larene Pickett, PA-C 12/18/18 0557    Merrily Pew, MD 12/18/18 [phone]

## 2018-12-18 NOTE — Op Note (Signed)
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  A lid speculum was placed.  Infusion line and trocar was placed at the 4 o'clock position approximately 3.5 mm from the surgical limbus.   The infusion line was allowed to run and then clamped when placed at the cannula opening. The line was inserted and secured to the drape with an adhesive strip.   Active trocars/cannula were placed at the 10 and 2 o'clock positions approximately 3.5 mm from the surgical limbus.   The lens was opaque and tumescent.  The anterior chamber was deepened with viscoelastic.  A 20 gauge MVR blade was used to make a sclerotomy at 11 o'clock after local peritomy was performed.  The MVR was passed through the lens to allow the fragmatome access.  The fragmatome was used to debulk and remove the majority of the lens.  The light pipe and vitreous cutter were inserted into the vitreous cavity and a core vitrectomy was performed.  The vitrector was used to remove remaining cortical material.  A posterior vitreous detachment was induced.  Care taken to remove the vitreous up to the vitreous base for 360 degrees.   Endolaser was applied 360 degrees to the periphery.  A partial air-fluid exchange was performed.  Miochol was placed in the eye to constrict the pupil.  The superior cannulas were sequentially removed with concommitant tamponade using a cotton tipped applicator and noted to be air tight.  The infusion line and trocar were removed and the sclerotomy was noted to be air tight with normal intraocular pressure by digital palpapation.  Subconjunctival injections of  Ancef and  Dexamethasone 4mg /68ml were placed in the infero-medial quadrant.   The speculum and drapes were removed and the eye was patched with Polymixin/Bacitracin ophthalmic ointment. An eye shield was placed and the patient was transferred alert and conversant with stable vital signs to the post operative recovery area.  The patient tolerated the procedure well and no complications were noted.  Royston Cowper MD

## 2018-12-18 NOTE — ED Notes (Signed)
3rd round of eye drops administered in Left eye

## 2018-12-19 ENCOUNTER — Telehealth: Payer: Self-pay | Admitting: *Deleted

## 2018-12-19 ENCOUNTER — Encounter (HOSPITAL_COMMUNITY): Payer: Self-pay | Admitting: Ophthalmology

## 2018-12-19 ENCOUNTER — Other Ambulatory Visit: Payer: Self-pay | Admitting: Oncology

## 2018-12-19 ENCOUNTER — Telehealth: Payer: Self-pay | Admitting: Oncology

## 2018-12-19 NOTE — Telephone Encounter (Signed)
Cancelled 5/29 appt per sch msg. Called and left msg

## 2018-12-19 NOTE — Telephone Encounter (Signed)
This RN received VM from pt requesting a return call regarding visit to the ER over the weekend due to ongoing eye discomfort " and they removed a cataract and I do not know if I can get chemo ".  ER notes including OP note per eye surgeon in [place].  This RN returned call- obtained identified VM- message left informing pt above information noted as well as she is scheduled to see the MD before her next planned treatment- situation will be reviewed and discussed including possible treatment changes.

## 2018-12-20 ENCOUNTER — Other Ambulatory Visit: Payer: Self-pay | Admitting: Licensed Clinical Social Worker

## 2018-12-20 DIAGNOSIS — C50912 Malignant neoplasm of unspecified site of left female breast: Secondary | ICD-10-CM

## 2018-12-21 ENCOUNTER — Inpatient Hospital Stay: Payer: BC Managed Care – PPO

## 2018-12-21 ENCOUNTER — Telehealth: Payer: Self-pay | Admitting: *Deleted

## 2018-12-21 ENCOUNTER — Inpatient Hospital Stay: Payer: BC Managed Care – PPO | Admitting: Oncology

## 2018-12-21 ENCOUNTER — Other Ambulatory Visit: Payer: Self-pay | Admitting: Oncology

## 2018-12-21 DIAGNOSIS — Z170 Estrogen receptor positive status [ER+]: Secondary | ICD-10-CM

## 2018-12-21 DIAGNOSIS — C50912 Malignant neoplasm of unspecified site of left female breast: Secondary | ICD-10-CM

## 2018-12-21 DIAGNOSIS — C50412 Malignant neoplasm of upper-outer quadrant of left female breast: Secondary | ICD-10-CM

## 2018-12-21 NOTE — Telephone Encounter (Signed)
This RN received VM from pt stating " I am at [facility] getting ready to have emergency eye surgery for the eye that I was having all that tearing from "  " I will not be able to see the doctor today "  Note pt was scheduled for lab only today- appointment for MD and chemo scheduled for tomorrow was canceled on 12/19/2018- " per provider ( no provider name attached ) " Appointment for today has been canceled this am by scheduler per pt's call to them.  Per MD review- request appointment for visit to be rescheduled to next week to discuss treatment plan.  Request will be sent to scheduling per above.

## 2018-12-21 NOTE — Progress Notes (Signed)
Paige Foster called to cancel the visit and treatment today because she was having emergency surgery.  We are scheduling her again for next week

## 2018-12-22 ENCOUNTER — Ambulatory Visit: Payer: BLUE CROSS/BLUE SHIELD

## 2018-12-22 ENCOUNTER — Telehealth: Payer: Self-pay | Admitting: Oncology

## 2018-12-22 ENCOUNTER — Ambulatory Visit: Payer: BLUE CROSS/BLUE SHIELD | Admitting: Oncology

## 2018-12-22 NOTE — Telephone Encounter (Signed)
Called patient regarding schedule

## 2018-12-25 ENCOUNTER — Telehealth: Payer: Self-pay | Admitting: *Deleted

## 2018-12-25 ENCOUNTER — Other Ambulatory Visit: Payer: Self-pay | Admitting: Oncology

## 2018-12-25 NOTE — Telephone Encounter (Signed)
This RN spoke with pt per follow up from last week and pt having to have emergency eye surgery on 5/28.  Paige Foster states she is recovering " but do not want to get more chemo because I can't lose my vision in that eye "  Paige Foster is unsure what is exactly wrong with her eye or what type of surgery she had- she did state " they are watching me closely because I could lose my vision in that eye "  Murray Hodgkins did inform this RN the eye surgeon name of Dr Julian Reil with [facility] with office number of [phone].  This RN informed Paige Foster this office will try to obtain surgery information and follow up with her regarding when we need to see her.  This RN attempted to call  [facility] x 2 with being put on hold - no answer - call d/ced.

## 2018-12-27 ENCOUNTER — Other Ambulatory Visit: Payer: BLUE CROSS/BLUE SHIELD

## 2018-12-29 ENCOUNTER — Inpatient Hospital Stay: Payer: BC Managed Care – PPO

## 2018-12-29 ENCOUNTER — Inpatient Hospital Stay: Payer: BC Managed Care – PPO | Admitting: Adult Health

## 2018-12-29 ENCOUNTER — Encounter: Payer: Self-pay | Admitting: Adult Health

## 2019-01-01 ENCOUNTER — Ambulatory Visit: Payer: BC Managed Care – PPO

## 2019-01-01 ENCOUNTER — Telehealth: Payer: Self-pay

## 2019-01-01 ENCOUNTER — Other Ambulatory Visit: Payer: Self-pay | Admitting: Oncology

## 2019-01-01 NOTE — Telephone Encounter (Signed)
RN placed call to [facility] to request medical records.  Pt prefers Dr. Jana Hakim to review medical notes from recent eye surgery prior to next chemotherapy.    Denisha at [facility] will place call to patient to obtain a medical release form.  Once signed, will fax orders to [phone].    Pt aware, voiced understanding.

## 2019-01-01 NOTE — Telephone Encounter (Signed)
RN returned call to patient.  Voicemail left for return call.

## 2019-01-03 ENCOUNTER — Telehealth: Payer: Self-pay

## 2019-01-03 NOTE — Telephone Encounter (Signed)
RN placed call to [facility] to follow up with medical records request.    Pt signed release form on 6/9 - awaiting medical records to fax request to office.

## 2019-01-04 ENCOUNTER — Telehealth: Payer: Self-pay | Admitting: Oncology

## 2019-01-04 NOTE — Telephone Encounter (Signed)
Returned call to [facility] per Mateo Flow (nurse) request through staff messages regarding medical records request @ [phone].  Could not through to talk to someone regarding Case #[id number]

## 2019-01-08 ENCOUNTER — Other Ambulatory Visit: Payer: Self-pay | Admitting: *Deleted

## 2019-01-09 ENCOUNTER — Telehealth: Payer: Self-pay | Admitting: *Deleted

## 2019-01-09 NOTE — Telephone Encounter (Signed)
Made 3 attempts to reach patient regarding moving genetics appointment, unable to reach her.

## 2019-01-09 NOTE — Telephone Encounter (Signed)
This RN returned VM left by pt inquiring if " did you get the records from the eye doctor and is it ok for me to have the surgery ".  Records received today per MD mail.  This RN returned call to pt and obtained identified answering machine.  Message left stating records received, though unsure what surgery she is referring to.  This RN also noted and stated appointments for this week including chemo- and that MD does want her to come in for visit but not chemo.  Pt has lab on 6/18 and then visit on 6/19- this RN left inquiry if pt wanted to change lab to 6/19 ?  This RN's name given for return call.

## 2019-01-11 ENCOUNTER — Inpatient Hospital Stay: Payer: BC Managed Care – PPO

## 2019-01-11 ENCOUNTER — Inpatient Hospital Stay: Payer: BC Managed Care – PPO | Attending: Oncology | Admitting: Oncology

## 2019-01-11 ENCOUNTER — Other Ambulatory Visit: Payer: Self-pay

## 2019-01-11 ENCOUNTER — Inpatient Hospital Stay: Payer: BC Managed Care – PPO | Admitting: Oncology

## 2019-01-11 ENCOUNTER — Other Ambulatory Visit: Payer: Self-pay | Admitting: *Deleted

## 2019-01-11 ENCOUNTER — Other Ambulatory Visit: Payer: BLUE CROSS/BLUE SHIELD

## 2019-01-11 ENCOUNTER — Telehealth: Payer: Self-pay | Admitting: Oncology

## 2019-01-11 VITALS — BP 120/76 | HR 65 | Temp 98.2°F | Resp 17 | Ht 64.0 in | Wt 144.6 lb

## 2019-01-11 DIAGNOSIS — C50912 Malignant neoplasm of unspecified site of left female breast: Secondary | ICD-10-CM

## 2019-01-11 DIAGNOSIS — H04559 Acquired stenosis of unspecified nasolacrimal duct: Secondary | ICD-10-CM | POA: Insufficient documentation

## 2019-01-11 DIAGNOSIS — Z86000 Personal history of in-situ neoplasm of breast: Secondary | ICD-10-CM | POA: Insufficient documentation

## 2019-01-11 DIAGNOSIS — C50412 Malignant neoplasm of upper-outer quadrant of left female breast: Secondary | ICD-10-CM | POA: Diagnosis present

## 2019-01-11 DIAGNOSIS — Z9012 Acquired absence of left breast and nipple: Secondary | ICD-10-CM | POA: Insufficient documentation

## 2019-01-11 DIAGNOSIS — Z170 Estrogen receptor positive status [ER+]: Secondary | ICD-10-CM

## 2019-01-11 DIAGNOSIS — Z5112 Encounter for antineoplastic immunotherapy: Secondary | ICD-10-CM | POA: Diagnosis present

## 2019-01-11 NOTE — Addendum Note (Signed)
Addended by: Chauncey Cruel on: 01/11/2019 09:09 AM   Modules accepted: Orders

## 2019-01-11 NOTE — Telephone Encounter (Signed)
Left message re June/July appointments. Patient to get updated schedule at visit tomorrow.

## 2019-01-11 NOTE — Progress Notes (Signed)
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 At that time she underwent a left mastectomy for stage 0 (noninvasive) breast cancer, with transverse rectus abdominis (TRAM) flap construction under [facility]. She also underwent a right breast reduction. She took tamoxifen for three years.  More recently she underwent bilateral diagnostic mammography with tomography and left breast ultrasonography at [facility] on 01/03/2018 showing: Breast Density Category B. There is an oval fat containing lesion in the left breast upper outer quadrant posterior depth. No Paige significant masses, calcifications, or Paige findings are seen in either breast. Sonographically, there is a 1.5 cm lesion in the left breast upper outer quadrant posterior depth. This lesion is of mixed echogenicity. This correlates as palpated and with mammography findings. Follow up was recommended.  Close follow-up was suggested.  She then presented with a non-tender mass in the left reconstructed breast on 08/30/2018. On physical exam, there is a hard palpable lump measuring 2.0 cm in the upper outer left reconstructed breast 10 cm from the expected location of a nipple. Sonography over this area demonstrates a 1.8 cm x 1.7 cm x 1.4 cm mass in the left breast at 2 o'clock posterior depth 10 cm from the nipple. This  mass is of mixed echogenicity. This abnormality is increased in size and correlates as palpated and with prior mammography findings. Color flow imaging demonstrates that there is vascularity present. Elastography imaging assessment is intermediate. No significant abnormalities were seen sonographically in the left axilla.    Accordingly on 08/30/2018 she proceeded to biopsy of the left breast mass in question. The pathology from this procedure showed ([id number]): invasive ductal carcinoma, grade III. Prognostic indicators significant for: estrogen receptor, 100% positive with strong staining intensity and progesterone receptor, 0% negative. Proliferation marker Ki67 at 15%. HER2 positive (3+) by immunohistochemistry.  The patient's subsequent history is as detailed below.   INTERVAL HISTORY: Paige Foster returns today after missing some visits and treatments because of eye surgery.  She was concerned that the chemotherapy caused her eye problems or might interfere with the results of the eye surgery.  Her last treatment was 12/01/2018.  She underwent a pars plana lensectomy 12/18/2018 under Dr. Posey Foster.  He is currently being treated for glaucoma through Dr. Loel Foster at the [facility]  Her last echocardiogram was 09/18/2018.   REVIEW OF SYSTEMS: Paige Foster is very concerned about losing vision in her left eye.  She says she does have vision there but it is blurry.  She is worried about the possibility of infection because chemotherapy lowers the immune system.  She is also worried about not receiving the full benefit of chemotherapy since she only received 4 of 6 planned cycles.  Of course that she is also receiving immunotherapy which is a separate question  She is keeping appropriate pandemic precautions.  Detailed review of systems today was otherwise stable  PAST MEDICAL HISTORY: Past Medical History:  Diagnosis  Date  . History of colon polyps   . Hypertension   . Recurrent breast  cancer, left [facility]) oncologist-- dr Paige Foster    dx 2004, noninvasive Stage 0 ----s/p left mastectomy w/ tram flap construction (and right breast reduction), taken Tamoxifen for 3 yrs;   08-30-2018 recurrent left cancer , Grade III,  cT1c,  ER positive, PR negative, HER-2 positive, invasive ductal carcinoma-- neoadjuvant chemo to start 09-26-2018  . Renal artery stenosis (HCC)    mild right external renal artery stenosis per duplex in epic 08-09-2013  . Wears glasses     PAST SURGICAL HISTORY: Past Surgical History:  Procedure Laterality Date  . COLONOSCOPY    . MASTECTOMY Left 2004   w/  TRAM flap construction and right breast augmentation with abdominoplasy  . PARS PLANA VITRECTOMY Left 12/18/2018   Procedure: PARS PLANA VITRECTOMY WITH 25 GAUGE, ENDOLASER;  Surgeon: Paige Mullet, Foster;  Location: [place];  Service: Ophthalmology;  Laterality: Left;  . PORTACATH PLACEMENT N/A 09/25/2018   Procedure: INSERTION PORT-A-CATH WITH ULTRASOUND;  Surgeon: Paige Bookbinder, Foster;  Location: [facility];  Service: General;  Laterality: N/A;  . TUBAL LIGATION Bilateral yrs ago     FAMILY HISTORY: Family History  Problem Relation Age of Onset  . Diabetes Mother   . Hypertension Mother   . Kidney disease Father   . Colon cancer Neg Hx   . Colon polyps Neg Hx   . Gallbladder disease Neg Hx   . Heart disease Neg Hx   . Esophageal cancer Neg Hx    Paige Foster's father died from unknown causes in his early 67's. Patients' mother died from diabetes complications at age 8. The patient has 1 sister. Patient denies anyone in her family having breast, ovarian, prostate, or pancreatic cancer.    GYNECOLOGIC HISTORY:  Patient's last menstrual period was 06/08/2007. 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 Her  husband, Paige Foster, works at [employer]. Paige Foster has two children, Paige Foster and Paige Foster. Paige Foster lives with her, is 59, and it attending [employer] for a computer based degree. Paige Foster lives with her, is 1, and recently graduated from [employer] with a degree in Careers information officer. Aliegha has no grandchildren. She attends the [employer].   ADVANCED DIRECTIVES: Her husband, Paige Foster, is automatically her Foster power of attorney     HEALTH MAINTENANCE: Social History   Tobacco Use  . Smoking status: Never Smoker  . Smokeless tobacco: Never Used  Substance Use Topics  . Alcohol use: Not Currently    Alcohol/week: 0.0 standard drinks    Comment: Occassionally  . Drug use: No    Colonoscopy: yes  PAP:   Bone density: yes, 2017; -1.6, osteopenic   No Known Allergies  Current Outpatient Medications  Medication Sig Dispense Refill  . amLODipine (NORVASC) 10 MG tablet Take 1 tablet (10 mg total) by mouth at bedtime. 90 tablet 1  . dexamethasone (DECADRON) 4 MG tablet Take 2 tablets (8 mg total) by mouth 2 (two) times daily. Start the day before Taxotere. Take once the day after, then 2 times a day x 2d. 30 tablet 1  . Flaxseed, Linseed, (FLAXSEED OIL) 1000 MG CAPS Take 1,000 mg by mouth daily.    Marland Kitchen lidocaine-prilocaine (EMLA) cream Apply to affected area once 30 g 3  .  Lifitegrast (XIIDRA) 5 % SOLN Apply 1 drop to eye every 12 (twelve) hours. 1 each 0  . LORazepam (ATIVAN) 0.5 MG tablet TAKE 1 TABLET(0.5 MG) BY MOUTH AT BEDTIME AS NEEDED FOR NAUSEA OR VOMITING 30 tablet 0  . losartan (COZAAR) 50 MG tablet TAKE 1 TABLET(50 MG) BY MOUTH DAILY 90 tablet 1  . magic mouthwash SOLN Take 5 mLs by mouth 3 (three) times daily. 240 mL 0  . Nutritional Supplements (GRAPESEED EXTRACT PO) Take 1 capsule by mouth daily.    . prochlorperazine (COMPAZINE) 10 MG tablet Take 1 tablet (10 mg total) by mouth every 6 (six) hours as needed (Nausea or vomiting). 30 tablet 1  . Resveratrol 250 MG CAPS Take 250 mg by mouth daily.     . traMADol (ULTRAM) 50 MG tablet Take 2 tablets (100 mg total) by mouth every 6 (six) hours as needed. 8 tablet 0  . TURMERIC PO Take 1,000 mg by mouth daily.    . valACYclovir (VALTREX) 500 MG tablet Take 1 tablet (500 mg total) by mouth 2 (two) times daily. 60 tablet 0   No current facility-administered medications for this visit.      OBJECTIVE: Middle-aged African-American woman in no acute distress  Vitals:   01/11/19 0823  BP: 120/76  Pulse: 65  Resp: 17  Temp: 98.2 F (36.8 C)  SpO2: 100%     Body mass index is 24.82 kg/m.   Wt Readings from Last 3 Encounters:  01/11/19 144 lb 9.6 oz (65.6 kg)  12/17/18 155 lb (70.3 kg)  12/01/18 157 lb 9.6 oz (71.5 kg)  ECOG FS:1  Sclerae unicteric, EOMs intact, left eye patch in place No cervical or supraclavicular adenopathy Lungs no rales or rhonchi Heart regular rate and rhythm Abd soft, nontender, positive bowel sounds MSK no focal spinal tenderness, no upper extremity lymphedema Neuro: nonfocal, well oriented, appropriate affect Breasts: The left breast is status post mastectomy and reconstruction.  The tumor is still palpable in the lateral aspect, measuring approximately 2 cm, movable, without associated erythema, and with a negative axilla    LAB RESULTS:  CMP     Component Value Date/Time   NA 137 12/18/2018 0122   NA 140 10/12/2017 0950   K 3.1 (L) 12/18/2018 0122   CL 104 12/18/2018 0122   CO2 22 12/18/2018 0122   GLUCOSE 160 (H) 12/18/2018 0122   BUN 5 (L) 12/18/2018 0122   BUN 10 10/12/2017 0950   CREATININE 0.52 12/18/2018 0122   CREATININE 0.78 09/07/2018 1455   CALCIUM 9.2 12/18/2018 0122   PROT 7.2 12/18/2018 0122   PROT 7.5 10/12/2017 0950   ALBUMIN 3.9 12/18/2018 0122   ALBUMIN 4.3 10/12/2017 0950   AST 16 12/18/2018 0122   AST 14 (L) 09/07/2018 1455   ALT 15 12/18/2018 0122   ALT 13 09/07/2018 1455   ALKPHOS 110 12/18/2018 0122   BILITOT <0.1 (L) 12/18/2018 0122   BILITOT 0.2 (L)  09/07/2018 1455   GFRNONAA >60 12/18/2018 0122   GFRNONAA >60 09/07/2018 1455   GFRAA >60 12/18/2018 0122   GFRAA >60 09/07/2018 1455    No results found for: TOTALPROTELP, ALBUMINELP, A1GS, A2GS, BETS, BETA2SER, GAMS, MSPIKE, SPEI  No results found for: KPAFRELGTCHN, LAMBDASER, KAPLAMBRATIO  Lab Results  Component Value Date   WBC 10.5 12/18/2018   NEUTROABS 8.5 (H) 12/18/2018   HGB 10.2 (L) 12/18/2018   HCT 30.9 (L) 12/18/2018   MCV 100.3 (H) 12/18/2018   PLT 290 12/18/2018  Lab Results  Component Value Date   LABCA2 <4 08/30/2007    No components found for: [id number]  No results for input(s): INR in the last 168 hours.  Lab Results  Component Value Date   LABCA2 <4 08/30/2007    No results found for: MWN027  No results found for: OZD664  No results found for: QIH474  No results found for: CA2729  No components found for: HGQUANT  No results found for: CEA1 / No results found for: CEA1   No results found for: AFPTUMOR  No results found for: [place]  No results found for: PSA1  No visits with results within 3 Day(s) from this visit.  Latest known visit with results is:  Admission on 12/17/2018, Discharged on 12/18/2018  Component Date Value Ref Range Status  . Sodium 12/18/2018 137  135 - 145 mmol/L Final  . Potassium 12/18/2018 3.1* 3.5 - 5.1 mmol/L Final  . Chloride 12/18/2018 104  98 - 111 mmol/L Final  . CO2 12/18/2018 22  22 - 32 mmol/L Final  . Glucose, Bld 12/18/2018 160* 70 - 99 mg/dL Final  . BUN 12/18/2018 5* 6 - 20 mg/dL Final  . Creatinine, Ser 12/18/2018 0.52  0.44 - 1.00 mg/dL Final  . Calcium 12/18/2018 9.2  8.9 - 10.3 mg/dL Final  . Total Protein 12/18/2018 7.2  6.5 - 8.1 g/dL Final  . Albumin 12/18/2018 3.9  3.5 - 5.0 g/dL Final  . AST 12/18/2018 16  15 - 41 U/L Final  . ALT 12/18/2018 15  0 - 44 U/L Final  . Alkaline Phosphatase 12/18/2018 110  38 - 126 U/L Final  . Total Bilirubin 12/18/2018 <0.1* 0.3 - 1.2 mg/dL Final    REPEATED TO VERIFY  . GFR calc non Af Amer 12/18/2018 >60  >60 mL/min Final  . GFR calc Af Amer 12/18/2018 >60  >60 mL/min Final  . Anion gap 12/18/2018 11  5 - 15 Final   Performed at [facility], [place] [address], [place], [place] [address]  . WBC 12/18/2018 10.5  4.0 - 10.5 K/uL Final  . RBC 12/18/2018 3.08* 3.87 - 5.11 MIL/uL Final  . Hemoglobin 12/18/2018 10.2* 12.0 - 15.0 g/dL Final  . HCT 12/18/2018 30.9* 36.0 - 46.0 % Final  . MCV 12/18/2018 100.3* 80.0 - 100.0 fL Final  . MCH 12/18/2018 33.1  26.0 - 34.0 pg Final  . MCHC 12/18/2018 33.0  30.0 - 36.0 g/dL Final  . RDW 12/18/2018 16.0* 11.5 - 15.5 % Final  . Platelets 12/18/2018 290  150 - 400 K/uL Final  . nRBC 12/18/2018 0.0  0.0 - 0.2 % Final  . Neutrophils Relative % 12/18/2018 82  % Final  . Neutro Abs 12/18/2018 8.5* 1.7 - 7.7 K/uL Final  . Lymphocytes Relative 12/18/2018 14  % Final  . Lymphs Abs 12/18/2018 1.5  0.7 - 4.0 K/uL Final  . Monocytes Relative 12/18/2018 3  % Final  . Monocytes Absolute 12/18/2018 0.4  0.1 - 1.0 K/uL Final  . Eosinophils Relative 12/18/2018 0  % Final  . Eosinophils Absolute 12/18/2018 0.0  0.0 - 0.5 K/uL Final  . Basophils Relative 12/18/2018 0  % Final  . Basophils Absolute 12/18/2018 0.0  0.0 - 0.1 K/uL Final  . Immature Granulocytes 12/18/2018 1  % Final  . Abs Immature Granulocytes 12/18/2018 0.14* 0.00 - 0.07 K/uL Final   Performed at [facility], [place] [address] [place], [place] [address]  . SARS Coronavirus 2  12/18/2018 NEGATIVE  NEGATIVE Final   Comment: (NOTE) If result is NEGATIVE SARS-CoV-2 target nucleic acids are NOT DETECTED. The SARS-CoV-2 RNA is generally detectable in upper and lower  respiratory specimens during the acute phase of infection. The lowest  concentration of SARS-CoV-2 viral copies this assay can detect is 250  copies / mL. A negative result does not preclude SARS-CoV-2 infection  and should not be used as the sole basis  for treatment or Paige  patient management decisions.  A negative result may occur with  improper specimen collection / handling, submission of specimen Paige  than nasopharyngeal swab, presence of viral mutation(s) within the  areas targeted by this assay, and inadequate number of viral copies  (<250 copies / mL). A negative result must be combined with clinical  observations, patient history, and epidemiological information. If result is POSITIVE SARS-CoV-2 target nucleic acids are DETECTED. The SARS-CoV-2 RNA is generally detectable in upper and lower  respiratory specimens dur                          ing the acute phase of infection.  Positive  results are indicative of active infection with SARS-CoV-2.  Clinical  correlation with patient history and Paige diagnostic information is  necessary to determine patient infection status.  Positive results do  not rule out bacterial infection or co-infection with Paige viruses. If result is PRESUMPTIVE POSTIVE SARS-CoV-2 nucleic acids MAY BE PRESENT.   A presumptive positive result was obtained on the submitted specimen  and confirmed on repeat testing.  While 2019 novel coronavirus  (SARS-CoV-2) nucleic acids may be present in the submitted sample  additional confirmatory testing may be necessary for epidemiological  and / or clinical management purposes  to differentiate between  SARS-CoV-2 and Paige Sarbecovirus currently known to infect humans.  If clinically indicated additional testing with an alternate test  methodology [id number]) is advised. The SARS-CoV-2 RNA is generally  detectable in upper and lower respiratory sp                          ecimens during the acute  phase of infection. The expected result is Negative. Fact Sheet for Patients:  StrictlyIdeas.no Fact Sheet for Foster Providers: BankingDealers.co.za This test is not yet approved or cleared by the [place] FDA  and has been authorized for detection and/or diagnosis of SARS-CoV-2 by FDA under an Emergency Use Authorization (EUA).  This EUA will remain in effect (meaning this test can be used) for the duration of the COVID-19 declaration under Section 564(b)(1) of the Act, 21 U.S.C. section 360bbb-3(b)(1), unless the authorization is terminated or revoked sooner. Performed at [facility], [place] [address] [place], [place] [address]     (this displays the last labs from the last 3 days)  No results found for: TOTALPROTELP, ALBUMINELP, A1GS, A2GS, BETS, BETA2SER, GAMS, MSPIKE, SPEI (this displays SPEP labs)  No results found for: KPAFRELGTCHN, LAMBDASER, KAPLAMBRATIO (kappa/lambda light chains)  No results found for: HGBA, HGBA2QUANT, HGBFQUANT, HGBSQUAN (Hemoglobinopathy evaluation)   Lab Results  Component Value Date   LDH 169 08/30/2007    Lab Results  Component Value Date   IRON 78 01/01/2009   TIBC 424 08/30/2007   IRONPCTSAT 17.9 (L) 01/01/2009   (Iron and TIBC)  Lab Results  Component Value Date   FERRITIN 19 08/30/2007    Urinalysis    Component Value Date/Time  LABSPEC 1.015 04/09/2008 1548   PHURINE 7.0 04/09/2008 1548   HGBUR large 04/09/2008 1548   BILIRUBINUR negative 04/09/2008 1548   UROBILINOGEN 0.2 04/09/2008 1548   NITRITE negative 04/09/2008 1548     STUDIES:  No results found.  ELIGIBLE FOR AVAILABLE RESEARCH PROTOCOL:    ASSESSMENT: 53 y.o. Paige Foster, [place] woman  (1) history of left-sided ductal carcinoma in situ 2004  (a) s/pleft mastectomy with TRAM reconstruction  (b) status post tamoxifen x3 years  (2) left breast upper outer quadrant biopsy 08/30/2018 shows a clinical T1c N0 invasive ductal carcinoma, grade 3, estrogen receptor positive, progesterone receptor negative, with HER-2 amplification, and and MIB-1 of 15%.   (3) neoadjuvant chemotherapy will consist of carboplatin, docetaxel, trastuzumab and Pertuzumab  starting 09/26/2018, repeated every 21 days x 6.  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    (b) chemotherapy discontinued after 4 cycles because of intercurrent eye surgery  (c) continuing trastuzumab and Pertuzumab to complete a year  (4) definitive surgery to follow  (5) adjuvant radiation therapy as appropriate  (6) antiestrogens to follow at the completion of local treatment   PLAN: Andreea is very concerned that she might lose vision in her left eye.  We reviewed the fact that docetaxel, which was discontinued after cycle 3, can cause tearing, but does not cause glaucoma or cataracts.  We also reviewed the fact that the standard of care is 6 cycles of the chemotherapy she was receiving, with the anti-HER-2 immunotherapy continuing to complete a year.  She wanted to know how much risk she was incurring by omitting the last 2 cycles of chemotherapy.  I do not have data regarding that unfortunately.  My recommendation was that she complete her 6 cycles of chemotherapy.  However she is still uncomfortable about her eye problem and so worried about it that she really does not want to do that.  She is agreeable to continuing the anti-HER-2 treatment and she will receive her next dose of trastuzumab and Pertuzumab tomorrow.  Since she is not receiving any further chemo she will be sent back to her surgeon Dr. Donne Hazel.  I will arrange for an MRI hopefully in the next few days so we can plan the surgery.  The anti-HER-2 treatment can be continued right through since it does not affect clotting or blood counts in any way  She will be due for an echocardiogram sometime this month  She also gave me some papers to complete for disability.  She should be able to go back to work she believes in early August.  She knows to call for any Paige issue that may develop before her return visit here.   Chauncey Cruel, [facility]  Oncology and Hematology [facility] [address] [place], [place] [address] Tel. [phone]    Fax. [phone]

## 2019-01-12 ENCOUNTER — Encounter (HOSPITAL_COMMUNITY): Payer: Self-pay | Admitting: Oncology

## 2019-01-12 ENCOUNTER — Inpatient Hospital Stay: Payer: BC Managed Care – PPO

## 2019-01-12 ENCOUNTER — Inpatient Hospital Stay: Payer: BC Managed Care – PPO | Admitting: Adult Health

## 2019-01-12 ENCOUNTER — Other Ambulatory Visit: Payer: Self-pay

## 2019-01-12 VITALS — BP 125/78 | HR 59 | Temp 98.3°F | Resp 16

## 2019-01-12 DIAGNOSIS — Z95828 Presence of other vascular implants and grafts: Secondary | ICD-10-CM

## 2019-01-12 DIAGNOSIS — C50912 Malignant neoplasm of unspecified site of left female breast: Secondary | ICD-10-CM

## 2019-01-12 DIAGNOSIS — Z170 Estrogen receptor positive status [ER+]: Secondary | ICD-10-CM

## 2019-01-12 DIAGNOSIS — C50412 Malignant neoplasm of upper-outer quadrant of left female breast: Secondary | ICD-10-CM | POA: Diagnosis not present

## 2019-01-12 LAB — COMPREHENSIVE METABOLIC PANEL
ALT: 10 U/L (ref 0–44)
AST: 12 U/L — ABNORMAL LOW (ref 15–41)
Albumin: 3.8 g/dL (ref 3.5–5.0)
Alkaline Phosphatase: 83 U/L (ref 38–126)
Anion gap: 7 (ref 5–15)
BUN: 5 mg/dL — ABNORMAL LOW (ref 6–20)
CO2: 26 mmol/L (ref 22–32)
Calcium: 9.1 mg/dL (ref 8.9–10.3)
Chloride: 107 mmol/L (ref 98–111)
Creatinine, Ser: 0.71 mg/dL (ref 0.44–1.00)
GFR calc Af Amer: >60 mL/min (ref >60)
GFR calc non Af Amer: >60 mL/min (ref >60)
Glucose, Bld: 92 mg/dL (ref 70–99)
Potassium: 3.9 mmol/L (ref 3.5–5.1)
Sodium: 140 mmol/L (ref 135–145)
Total Bilirubin: 0.2 mg/dL — ABNORMAL LOW (ref 0.3–1.2)
Total Protein: 7 g/dL (ref 6.5–8.1)

## 2019-01-12 LAB — CBC WITH DIFFERENTIAL/PLATELET
Abs Immature Granulocytes: 0.01 10*3/uL (ref 0.00–0.07)
Basophils Absolute: 0 10*3/uL (ref 0.0–0.1)
Basophils Relative: 0 %
Eosinophils Absolute: 0 10*3/uL (ref 0.0–0.5)
Eosinophils Relative: 0 %
HCT: 32.8 % — ABNORMAL LOW (ref 36.0–46.0)
Hemoglobin: 10.9 g/dL — ABNORMAL LOW (ref 12.0–15.0)
Immature Granulocytes: 0 %
Lymphocytes Relative: 32 %
Lymphs Abs: 2.4 10*3/uL (ref 0.7–4.0)
MCH: 33.2 pg (ref 26.0–34.0)
MCHC: 33.2 g/dL (ref 30.0–36.0)
MCV: 100 fL (ref 80.0–100.0)
Monocytes Absolute: 0.7 10*3/uL (ref 0.1–1.0)
Monocytes Relative: 9 %
Neutro Abs: 4.3 10*3/uL (ref 1.7–7.7)
Neutrophils Relative %: 59 %
Platelets: 249 10*3/uL (ref 150–400)
RBC: 3.28 MIL/uL — ABNORMAL LOW (ref 3.87–5.11)
RDW: 14.6 % (ref 11.5–15.5)
WBC: 7.4 10*3/uL (ref 4.0–10.5)
nRBC: 0 % (ref 0.0–0.2)

## 2019-01-12 MED ORDER — SODIUM CHLORIDE 0.9 % IV SOLN
Freq: Once | INTRAVENOUS | Status: AC
  Administered 2019-01-12: 11:00:00 via INTRAVENOUS
  Filled 2019-01-12: qty 250

## 2019-01-12 MED ORDER — SODIUM CHLORIDE 0.9% FLUSH
10.0000 mL | Freq: Once | INTRAVENOUS | Status: AC
  Administered 2019-01-12: 10 mL
  Filled 2019-01-12: qty 10

## 2019-01-12 MED ORDER — HEPARIN SOD (PORK) LOCK FLUSH 100 UNIT/ML IV SOLN
500.0000 [IU] | Freq: Once | INTRAVENOUS | Status: AC | PRN
  Administered 2019-01-12: 500 [IU]
  Filled 2019-01-12: qty 5

## 2019-01-12 MED ORDER — ACETAMINOPHEN 325 MG PO TABS
ORAL_TABLET | ORAL | Status: AC
  Filled 2019-01-12: qty 2

## 2019-01-12 MED ORDER — DIPHENHYDRAMINE HCL 25 MG PO CAPS
ORAL_CAPSULE | ORAL | Status: AC
  Filled 2019-01-12: qty 1

## 2019-01-12 MED ORDER — SODIUM CHLORIDE 0.9 % IV SOLN
420.0000 mg | Freq: Once | INTRAVENOUS | Status: AC
  Administered 2019-01-12: 12:00:00 420 mg via INTRAVENOUS
  Filled 2019-01-12: qty 14

## 2019-01-12 MED ORDER — DIPHENHYDRAMINE HCL 25 MG PO CAPS
25.0000 mg | ORAL_CAPSULE | Freq: Once | ORAL | Status: AC
  Administered 2019-01-12: 11:00:00 25 mg via ORAL

## 2019-01-12 MED ORDER — ACETAMINOPHEN 325 MG PO TABS
650.0000 mg | ORAL_TABLET | Freq: Once | ORAL | Status: AC
  Administered 2019-01-12: 650 mg via ORAL

## 2019-01-12 MED ORDER — SODIUM CHLORIDE 0.9% FLUSH
10.0000 mL | INTRAVENOUS | Status: DC | PRN
  Administered 2019-01-12: 10 mL
  Filled 2019-01-12: qty 10

## 2019-01-12 MED ORDER — TRASTUZUMAB CHEMO 150 MG IV SOLR
8.0000 mg/kg | Freq: Once | INTRAVENOUS | Status: DC
  Filled 2019-01-12: qty 25

## 2019-01-12 MED ORDER — TRASTUZUMAB CHEMO 150 MG IV SOLR
8.0000 mg/kg | Freq: Once | INTRAVENOUS | Status: AC
  Administered 2019-01-12: 12:00:00 525 mg via INTRAVENOUS
  Filled 2019-01-12: qty 25

## 2019-01-12 NOTE — Patient Instructions (Signed)
[facility] Discharge Instructions for Patients Receiving Chemotherapy  Today you received the following chemotherapy agents Herceptin and Perjeta.   To help prevent nausea and vomiting after your treatment, we encourage you to take your nausea medication as directed.   If you develop nausea and vomiting that is not controlled by your nausea medication, call the clinic.   BELOW ARE SYMPTOMS THAT SHOULD BE REPORTED IMMEDIATELY:  *FEVER GREATER THAN 100.5 F  *CHILLS WITH OR WITHOUT FEVER  NAUSEA AND VOMITING THAT IS NOT CONTROLLED WITH YOUR NAUSEA MEDICATION  *UNUSUAL SHORTNESS OF BREATH  *UNUSUAL BRUISING OR BLEEDING  TENDERNESS IN MOUTH AND THROAT WITH OR WITHOUT PRESENCE OF ULCERS  *URINARY PROBLEMS  *BOWEL PROBLEMS  UNUSUAL RASH Items with * indicate a potential emergency and should be followed up as soon as possible.  Feel free to call the clinic should you have any questions or concerns. The clinic phone number is [phone].  Please show the CHEMO ALERT CARD at check-in to the Emergency Department and triage nurse.   

## 2019-01-12 NOTE — Progress Notes (Signed)
Pt has not had ECHO since Feb. 2020. MD note from visit on 6/18 states that ECHO will be scheduled for some time this month.

## 2019-01-15 ENCOUNTER — Other Ambulatory Visit: Payer: Self-pay | Admitting: Oncology

## 2019-01-15 DIAGNOSIS — C50912 Malignant neoplasm of unspecified site of left female breast: Secondary | ICD-10-CM

## 2019-01-15 DIAGNOSIS — Z170 Estrogen receptor positive status [ER+]: Secondary | ICD-10-CM

## 2019-01-15 DIAGNOSIS — C50412 Malignant neoplasm of upper-outer quadrant of left female breast: Secondary | ICD-10-CM

## 2019-01-16 ENCOUNTER — Encounter: Payer: Self-pay | Admitting: *Deleted

## 2019-01-17 ENCOUNTER — Other Ambulatory Visit: Payer: Self-pay

## 2019-01-17 ENCOUNTER — Other Ambulatory Visit: Payer: Self-pay | Admitting: General Surgery

## 2019-01-17 DIAGNOSIS — C50912 Malignant neoplasm of unspecified site of left female breast: Secondary | ICD-10-CM

## 2019-01-17 MED ORDER — LORAZEPAM 0.5 MG PO TABS: 0.5000 mg | ORAL_TABLET | Freq: Three times a day (TID) | ORAL | 0 refills | Status: DC

## 2019-01-19 ENCOUNTER — Telehealth: Payer: Self-pay | Admitting: Licensed Clinical Social Worker

## 2019-01-19 NOTE — Telephone Encounter (Signed)
Called patient regarding upcoming Webex appointment, patient would prefer this to be a walk-in visit. °

## 2019-01-22 ENCOUNTER — Telehealth: Payer: Self-pay | Admitting: *Deleted

## 2019-01-22 ENCOUNTER — Inpatient Hospital Stay (HOSPITAL_BASED_OUTPATIENT_CLINIC_OR_DEPARTMENT_OTHER): Payer: BC Managed Care – PPO | Admitting: Licensed Clinical Social Worker

## 2019-01-22 ENCOUNTER — Inpatient Hospital Stay: Payer: BC Managed Care – PPO

## 2019-01-22 ENCOUNTER — Other Ambulatory Visit: Payer: Self-pay

## 2019-01-22 ENCOUNTER — Telehealth (HOSPITAL_COMMUNITY): Payer: Self-pay

## 2019-01-22 DIAGNOSIS — C50912 Malignant neoplasm of unspecified site of left female breast: Secondary | ICD-10-CM | POA: Diagnosis not present

## 2019-01-22 DIAGNOSIS — Z170 Estrogen receptor positive status [ER+]: Secondary | ICD-10-CM

## 2019-01-22 DIAGNOSIS — C50412 Malignant neoplasm of upper-outer quadrant of left female breast: Secondary | ICD-10-CM | POA: Diagnosis not present

## 2019-01-22 NOTE — Telephone Encounter (Signed)
Records faxed to [facility]; Release [id number]

## 2019-01-22 NOTE — Progress Notes (Signed)
REFERRING PROVIDER: Chauncey Cruel, MD [address] [place],  [place] [address]  PRIMARY PROVIDER:  Nche, Charlene Brooke, NP  PRIMARY REASON FOR VISIT:  1. Malignant neoplasm of upper-outer quadrant of left breast in female, estrogen receptor positive ([place])   2. Recurrent breast cancer, left ([place])      HISTORY OF PRESENT ILLNESS:   Ms. Fugett, a 53 y.o. female, was seen for a [place] cancer genetics consultation at the request of Dr. Jana Hakim due to a personal history of breast cancer.  Ms. Defoor presents to clinic today to discuss the possibility of a hereditary predisposition to cancer, genetic testing, and to further clarify her future cancer risks, as well as potential cancer risks for family members.   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 This was treated with mastectomy and she took tamoxifen for 3 years. In 2020, at the age of 23, she was diagnosed with IDC of the left breast, ER+, PR-, Her2+. The treatment plan includes neoadjuvant chemotherapy, surgery, radiation as appropriate and antiestrogens.   CANCER HISTORY:  Oncology History  Malignant neoplasm of upper-outer quadrant of left breast in female, estrogen receptor positive ([place])  09/06/2018 Initial Diagnosis   Malignant neoplasm of upper-outer quadrant of left breast in female, estrogen receptor positive ([place])   09/29/2018 - 12/28/2018 Chemotherapy   The patient had palonosetron (ALOXI) injection 0.25 mg, 0.25 mg, Intravenous,  Once, 4 of 6 cycles Administration: 0.25 mg (09/29/2018), 0.25 mg (10/20/2018), 0.25 mg (11/09/2018), 0.25 mg (12/01/2018) pegfilgrastim-cbqv (UDENYCA) injection 6 mg, 6 mg, Subcutaneous, Once, 4 of 6 cycles Administration: 6 mg (10/02/2018), 6 mg (10/23/2018), 6 mg (11/10/2018), 6 mg (12/04/2018) trastuzumab (HERCEPTIN) 600 mg in sodium chloride 0.9 % 250 mL chemo infusion, 609 mg, Intravenous,  Once, 4 of 6 cycles Administration: 600 mg (09/29/2018),  450 mg (10/20/2018), 450 mg (11/09/2018), 450 mg (12/01/2018) CARBOplatin (PARAPLATIN) 600 mg in sodium chloride 0.9 % 250 mL chemo infusion, 600 mg (100 % of original dose 604 mg), Intravenous,  Once, 4 of 6 cycles Dose modification:   (original dose 604 mg, Cycle 1) Administration: 600 mg (09/29/2018), 600 mg (10/20/2018), 600 mg (11/09/2018), 600 mg (12/01/2018) DOCEtaxel (TAXOTERE) 140 mg in sodium chloride 0.9 % 250 mL chemo infusion, 75 mg/m2 = 140 mg, Intravenous,  Once, 3 of 3 cycles Administration: 140 mg (09/29/2018), 140 mg (10/20/2018), 140 mg (11/09/2018) gemcitabine (GEMZAR) 1,482 mg in sodium chloride 0.9 % 250 mL chemo infusion, 800 mg/m2 = 1,482 mg (100 % of original dose 800 mg/m2), Intravenous,  Once, 1 of 3 cycles Dose modification: 800 mg/m2 (original dose 800 mg/m2, Cycle 4, Reason: Provider Judgment) Administration: 1,482 mg (12/01/2018) pertuzumab (PERJETA) 840 mg in sodium chloride 0.9 % 250 mL chemo infusion, 840 mg, Intravenous, Once, 4 of 6 cycles Administration: 840 mg (09/29/2018), 420 mg (10/20/2018), 420 mg (11/09/2018), 420 mg (12/01/2018) fosaprepitant (EMEND) 150 mg, dexamethasone (DECADRON) 12 mg in sodium chloride 0.9 % 145 mL IVPB, , Intravenous,  Once, 4 of 6 cycles Administration:  (09/29/2018),  (10/20/2018),  (11/09/2018),  (12/01/2018)  for chemotherapy treatment.    01/12/2019 -  Chemotherapy   The patient had trastuzumab (HERCEPTIN) 525 mg in sodium chloride 0.9 % 250 mL chemo infusion, 8 mg/kg = 525 mg, Intravenous,  Once, 1 of 5 cycles Administration: 525 mg (01/12/2019) pertuzumab (PERJETA) 420 mg in sodium chloride 0.9 % 250 mL chemo infusion, 420 mg (100 % of original dose 420  mg), Intravenous, Once, 1 of 5 cycles Dose modification: 420 mg (original dose 420 mg, Cycle 1, Reason: Provider Judgment) Administration: 420 mg (01/12/2019)  for chemotherapy treatment.    Recurrent breast cancer, left ([place])  09/07/2018 Initial Diagnosis   Recurrent breast cancer, left ([place])    09/29/2018 - 12/28/2018 Chemotherapy   The patient had palonosetron (ALOXI) injection 0.25 mg, 0.25 mg, Intravenous,  Once, 4 of 6 cycles Administration: 0.25 mg (09/29/2018), 0.25 mg (10/20/2018), 0.25 mg (11/09/2018), 0.25 mg (12/01/2018) pegfilgrastim-cbqv (UDENYCA) injection 6 mg, 6 mg, Subcutaneous, Once, 4 of 6 cycles Administration: 6 mg (10/02/2018), 6 mg (10/23/2018), 6 mg (11/10/2018), 6 mg (12/04/2018) trastuzumab (HERCEPTIN) 600 mg in sodium chloride 0.9 % 250 mL chemo infusion, 609 mg, Intravenous,  Once, 4 of 6 cycles Administration: 600 mg (09/29/2018), 450 mg (10/20/2018), 450 mg (11/09/2018), 450 mg (12/01/2018) CARBOplatin (PARAPLATIN) 600 mg in sodium chloride 0.9 % 250 mL chemo infusion, 600 mg (100 % of original dose 604 mg), Intravenous,  Once, 4 of 6 cycles Dose modification:   (original dose 604 mg, Cycle 1) Administration: 600 mg (09/29/2018), 600 mg (10/20/2018), 600 mg (11/09/2018), 600 mg (12/01/2018) DOCEtaxel (TAXOTERE) 140 mg in sodium chloride 0.9 % 250 mL chemo infusion, 75 mg/m2 = 140 mg, Intravenous,  Once, 3 of 3 cycles Administration: 140 mg (09/29/2018), 140 mg (10/20/2018), 140 mg (11/09/2018) gemcitabine (GEMZAR) 1,482 mg in sodium chloride 0.9 % 250 mL chemo infusion, 800 mg/m2 = 1,482 mg (100 % of original dose 800 mg/m2), Intravenous,  Once, 1 of 3 cycles Dose modification: 800 mg/m2 (original dose 800 mg/m2, Cycle 4, Reason: Provider Judgment) Administration: 1,482 mg (12/01/2018) pertuzumab (PERJETA) 840 mg in sodium chloride 0.9 % 250 mL chemo infusion, 840 mg, Intravenous, Once, 4 of 6 cycles Administration: 840 mg (09/29/2018), 420 mg (10/20/2018), 420 mg (11/09/2018), 420 mg (12/01/2018) fosaprepitant (EMEND) 150 mg, dexamethasone (DECADRON) 12 mg in sodium chloride 0.9 % 145 mL IVPB, , Intravenous,  Once, 4 of 6 cycles Administration:  (09/29/2018),  (10/20/2018),  (11/09/2018),  (12/01/2018)  for chemotherapy treatment.       RISK FACTORS:  Menarche was at age 1.  First live birth  at age 85.  OCP use for approximately 5 years.  Ovaries intact: yes.  Hysterectomy: no.  Menopausal status: postmenopausal.  HRT use: 0 years. Colonoscopy: yes; history of polyps. Mammogram within the last year: yes.  Past Medical History:  Diagnosis Date  . History of colon polyps   . Hypertension   . Recurrent breast cancer, left [facility]) oncologist-- dr Jana Hakim    dx 2004, noninvasive Stage 0 ----s/p left mastectomy w/ tram flap construction (and right breast reduction), taken Tamoxifen for 3 yrs;   08-30-2018 recurrent left cancer , Grade III,  cT1c,  ER positive, PR negative, HER-2 positive, invasive ductal carcinoma-- neoadjuvant chemo to start 09-26-2018  . Renal artery stenosis (HCC)    mild right external renal artery stenosis per duplex in epic 08-09-2013  . Wears glasses     Past Surgical History:  Procedure Laterality Date  . COLONOSCOPY    . MASTECTOMY Left 2004   w/  TRAM flap construction and right breast augmentation with abdominoplasy  . PARS PLANA VITRECTOMY Left 12/18/2018   Procedure: PARS PLANA VITRECTOMY WITH 25 GAUGE, ENDOLASER;  Surgeon: Jalene Mullet, MD;  Location: [facility];  Service: Ophthalmology;  Laterality: Left;  . PORTACATH PLACEMENT N/A 09/25/2018   Procedure: INSERTION PORT-A-CATH WITH ULTRASOUND;  Surgeon: Rolm Bookbinder, MD;  Location: [facility];  Service: General;  Laterality: N/A;  . TUBAL LIGATION Bilateral yrs ago    Social History   Socioeconomic History  . Marital status: Married    Spouse name: Not on file  . Number of children: 2  . Years of education: Not on file  . Highest education level: Not on file  Occupational History  . Occupation: [employer]  Social Needs  . Financial resource strain: Not on file  . Food insecurity    Worry: Not on file    Inability: Not on file  . Transportation needs    Medical: Not on file    Non-medical: Not on file  Tobacco Use  . Smoking status: Never Smoker  . Smokeless tobacco: Never Used   Substance and Sexual Activity  . Alcohol use: Not Currently    Alcohol/week: 0.0 standard drinks    Comment: Occassionally  . Drug use: No  . Sexual activity: Not on file  Lifestyle  . Physical activity    Days per week: Not on file    Minutes per session: Not on file  . Stress: Not on file  Relationships  . Social Herbalist on phone: Not on file    Gets together: Not on file    Attends religious service: Not on file    Active member of club or organization: Not on file    Attends meetings of clubs or organizations: Not on file    Relationship status: Not on file  Other Topics Concern  . Not on file  Social History Narrative  . Not on file     FAMILY HISTORY:  We obtained a detailed, 4-generation family history.  Significant diagnoses are listed below: Family History  Problem Relation Age of Onset  . Diabetes Mother   . Hypertension Mother   . Kidney disease Father   . Colon cancer Neg Hx   . Colon polyps Neg Hx   . Gallbladder disease Neg Hx   . Heart disease Neg Hx   . Esophageal cancer Neg Hx    Ms. Minahan has 2 sons, ages 8 and 83, no history of cancer. She has one sister, age 76, who has no history of cancer. She has 3 nieces.   Ms. Crockett's mother died at 84, no history of cancer. The patient had 6 maternal aunts, 2 maternal uncles, she is unaware of cancer history for any of them. No known cancers in her maternal cousins. Her maternal grandmother died at 68, maternal grandfather died in his 61s.  Ms. Haymaker's father died in his 44s, no history of cancer. The patient had 1 maternal uncle who died in his 25s, no history of cancer. She is not aware of cancers in paternal cousins. She does not know how old her paternal grandparents were when they died or whether they had cancer.  Ms. Gin is unaware of previous family history of genetic testing for hereditary cancer risks.. There is no reported Ashkenazi Jewish ancestry. There is no known  consanguinity.  GENETIC COUNSELING ASSESSMENT: Ms. Tseng is a 53 y.o. female with a personal history of breast cancer at 32 which is somewhat suggestive of a hereditary cancer syndrome and predisposition to cancer. We, therefore, discussed and recommended the following at today's visit.   DISCUSSION: We discussed that 5 - 10% of breast cancer is hereditary, with most cases associated with the BRCA1/BRCA2 genes.  There are other genes that can be associated with hereditary breast cancer syndromes.  These include PALB2, CHEK2,  ATM.    We reviewed the characteristics, features and inheritance patterns of hereditary cancer syndromes. We also discussed genetic testing, including the appropriate family members to test, the process of testing, insurance coverage and turn-around-time for results. We discussed the implications of a negative, positive and/or variant of uncertain significant result. We recommended Ms. Hoefer pursue genetic testing for the Common Hereditary Cancers gene panel.   The Common Hereditary Cancers Panel offered by [facility] includes sequencing and/or deletion duplication testing of the following 48 genes: APC, ATM, AXIN2, BARD1, BMPR1A, BRCA1, BRCA2, BRIP1, CDH1, CDKN2A (p14ARF), CDKN2A (p16INK4a), CKD4, CHEK2, CTNNA1, DICER1, EPCAM (Deletion/duplication testing only), GREM1 (promoter region deletion/duplication testing only), KIT, MEN1, MLH1, MSH2, MSH3, MSH6, MUTYH, NBN, NF1, NHTL1, PALB2, PDGFRA, PMS2, POLD1, POLE, PTEN, RAD50, RAD51C, RAD51D, RNF43, SDHB, SDHC, SDHD, SMAD4, SMARCA4. STK11, TP53, TSC1, TSC2, and VHL.  The following genes were evaluated for sequence changes only: SDHA and HOXB13 c.251G>A variant only.  Based on Ms. Vanbuskirk's personal history of cancer, she meets medical criteria for genetic testing. Despite that she meets criteria, she may still have an out of pocket cost.   PLAN: After considering the risks, benefits, and limitations, Ms. Ruffner provided informed  consent to pursue genetic testing and the blood sample was sent to [facility] for analysis of the Common Hereditary Cancers Panel. Results should be available within approximately 2-3 weeks' time, at which point they will be disclosed by telephone to Ms. Crevier, as will any additional recommendations warranted by these results. Ms. Beeck will receive a summary of her genetic counseling visit and a copy of her results once available. This information will also be available in Epic.   Lastly, we encouraged Ms. Len to remain in contact with cancer genetics annually so that we can continuously update the family history and inform her of any changes in cancer genetics and testing that may be of benefit for this family.   Ms. Tomasello's questions were answered to her satisfaction today. Our contact information was provided should additional questions or concerns arise. Thank you for the referral and allowing [place] to share in the care of your patient.   Faith Rogue, MS Genetic Counselor [email or web address] Phone: [phone]  The patient was seen for a total of 25 minutes in face-to-face genetic counseling.  Drs. Magrinat, Lindi Adie and/or Burr Medico were available for discussion regarding this case.

## 2019-01-22 NOTE — Telephone Encounter (Signed)
LMTCB COVID prescreening for echo. Left echo appt details per DPR. 

## 2019-01-23 ENCOUNTER — Ambulatory Visit (HOSPITAL_COMMUNITY): Payer: BC Managed Care – PPO | Attending: Cardiology

## 2019-01-23 DIAGNOSIS — C50912 Malignant neoplasm of unspecified site of left female breast: Secondary | ICD-10-CM | POA: Insufficient documentation

## 2019-01-23 DIAGNOSIS — Z170 Estrogen receptor positive status [ER+]: Secondary | ICD-10-CM | POA: Insufficient documentation

## 2019-01-23 DIAGNOSIS — C50412 Malignant neoplasm of upper-outer quadrant of left female breast: Secondary | ICD-10-CM | POA: Diagnosis present

## 2019-01-26 ENCOUNTER — Ambulatory Visit
Admission: RE | Admit: 2019-01-26 | Discharge: 2019-01-26 | Disposition: A | Discharge status: Home / Self Care | Payer: BC Managed Care – PPO | Source: Ambulatory Visit | Attending: Oncology | Admitting: Oncology

## 2019-01-26 ENCOUNTER — Other Ambulatory Visit: Payer: Self-pay

## 2019-01-26 DIAGNOSIS — Z170 Estrogen receptor positive status [ER+]: Secondary | ICD-10-CM

## 2019-01-26 DIAGNOSIS — C50412 Malignant neoplasm of upper-outer quadrant of left female breast: Secondary | ICD-10-CM

## 2019-01-26 DIAGNOSIS — C50912 Malignant neoplasm of unspecified site of left female breast: Secondary | ICD-10-CM

## 2019-01-26 MED ORDER — GADOBUTROL 1 MMOL/ML IV SOLN
7.0000 mL | Freq: Once | INTRAVENOUS | Status: AC | PRN
  Administered 2019-01-26: 7 mL via INTRAVENOUS

## 2019-01-31 ENCOUNTER — Inpatient Hospital Stay: Payer: BC Managed Care – PPO | Attending: Oncology

## 2019-01-31 ENCOUNTER — Encounter: Payer: Self-pay | Admitting: Oncology

## 2019-01-31 ENCOUNTER — Encounter: Payer: Self-pay | Admitting: Adult Health

## 2019-01-31 ENCOUNTER — Inpatient Hospital Stay: Payer: BC Managed Care – PPO

## 2019-01-31 ENCOUNTER — Telehealth: Payer: Self-pay | Admitting: Adult Health

## 2019-01-31 ENCOUNTER — Inpatient Hospital Stay (HOSPITAL_BASED_OUTPATIENT_CLINIC_OR_DEPARTMENT_OTHER): Payer: BC Managed Care – PPO | Admitting: Adult Health

## 2019-01-31 ENCOUNTER — Other Ambulatory Visit: Payer: Self-pay

## 2019-01-31 VITALS — BP 118/66 | HR 63 | Temp 98.2°F | Resp 18 | Ht 64.0 in | Wt 139.3 lb

## 2019-01-31 VITALS — BP 103/65

## 2019-01-31 DIAGNOSIS — C50412 Malignant neoplasm of upper-outer quadrant of left female breast: Secondary | ICD-10-CM | POA: Insufficient documentation

## 2019-01-31 DIAGNOSIS — Z79899 Other long term (current) drug therapy: Secondary | ICD-10-CM | POA: Diagnosis not present

## 2019-01-31 DIAGNOSIS — C50912 Malignant neoplasm of unspecified site of left female breast: Secondary | ICD-10-CM

## 2019-01-31 DIAGNOSIS — Z170 Estrogen receptor positive status [ER+]: Secondary | ICD-10-CM

## 2019-01-31 DIAGNOSIS — Z9012 Acquired absence of left breast and nipple: Secondary | ICD-10-CM | POA: Insufficient documentation

## 2019-01-31 DIAGNOSIS — Z5112 Encounter for antineoplastic immunotherapy: Secondary | ICD-10-CM | POA: Diagnosis present

## 2019-01-31 DIAGNOSIS — I10 Essential (primary) hypertension: Secondary | ICD-10-CM

## 2019-01-31 DIAGNOSIS — Z95828 Presence of other vascular implants and grafts: Secondary | ICD-10-CM

## 2019-01-31 LAB — CBC WITH DIFFERENTIAL/PLATELET
Abs Immature Granulocytes: 0.01 10*3/uL (ref 0.00–0.07)
Basophils Absolute: 0 10*3/uL (ref 0.0–0.1)
Basophils Relative: 0 %
Eosinophils Absolute: 0.1 10*3/uL (ref 0.0–0.5)
Eosinophils Relative: 1 %
HCT: 34.7 % — ABNORMAL LOW (ref 36.0–46.0)
Hemoglobin: 11.5 g/dL — ABNORMAL LOW (ref 12.0–15.0)
Immature Granulocytes: 0 %
Lymphocytes Relative: 39 %
Lymphs Abs: 2.5 10*3/uL (ref 0.7–4.0)
MCH: 33.2 pg (ref 26.0–34.0)
MCHC: 33.1 g/dL (ref 30.0–36.0)
MCV: 100.3 fL — ABNORMAL HIGH (ref 80.0–100.0)
Monocytes Absolute: 0.5 10*3/uL (ref 0.1–1.0)
Monocytes Relative: 8 %
Neutro Abs: 3.3 10*3/uL (ref 1.7–7.7)
Neutrophils Relative %: 52 %
Platelets: 252 10*3/uL (ref 150–400)
RBC: 3.46 MIL/uL — ABNORMAL LOW (ref 3.87–5.11)
RDW: 12.6 % (ref 11.5–15.5)
WBC: 6.3 10*3/uL (ref 4.0–10.5)
nRBC: 0 % (ref 0.0–0.2)

## 2019-01-31 LAB — COMPREHENSIVE METABOLIC PANEL
ALT: 10 U/L (ref 0–44)
AST: 14 U/L — ABNORMAL LOW (ref 15–41)
Albumin: 3.8 g/dL (ref 3.5–5.0)
Alkaline Phosphatase: 73 U/L (ref 38–126)
Anion gap: 9 (ref 5–15)
BUN: 11 mg/dL (ref 6–20)
CO2: 23 mmol/L (ref 22–32)
Calcium: 9.1 mg/dL (ref 8.9–10.3)
Chloride: 107 mmol/L (ref 98–111)
Creatinine, Ser: 0.75 mg/dL (ref 0.44–1.00)
GFR calc Af Amer: >60 mL/min (ref >60)
GFR calc non Af Amer: >60 mL/min (ref >60)
Glucose, Bld: 107 mg/dL — ABNORMAL HIGH (ref 70–99)
Potassium: 4 mmol/L (ref 3.5–5.1)
Sodium: 139 mmol/L (ref 135–145)
Total Bilirubin: 0.2 mg/dL — ABNORMAL LOW (ref 0.3–1.2)
Total Protein: 7 g/dL (ref 6.5–8.1)

## 2019-01-31 MED ORDER — ACETAMINOPHEN 325 MG PO TABS
650.0000 mg | ORAL_TABLET | Freq: Once | ORAL | Status: AC
  Administered 2019-01-31: 10:00:00 650 mg via ORAL

## 2019-01-31 MED ORDER — ACETAMINOPHEN 325 MG PO TABS
ORAL_TABLET | ORAL | Status: AC
  Filled 2019-01-31: qty 2

## 2019-01-31 MED ORDER — HEPARIN SOD (PORK) LOCK FLUSH 100 UNIT/ML IV SOLN
500.0000 [IU] | Freq: Once | INTRAVENOUS | Status: AC | PRN
  Administered 2019-01-31: 500 [IU]
  Filled 2019-01-31: qty 5

## 2019-01-31 MED ORDER — SODIUM CHLORIDE 0.9% FLUSH
10.0000 mL | INTRAVENOUS | Status: DC | PRN
  Administered 2019-01-31: 10 mL
  Filled 2019-01-31: qty 10

## 2019-01-31 MED ORDER — SODIUM CHLORIDE 0.9 % IV SOLN
420.0000 mg | Freq: Once | INTRAVENOUS | Status: AC
  Administered 2019-01-31: 420 mg via INTRAVENOUS
  Filled 2019-01-31: qty 14

## 2019-01-31 MED ORDER — SODIUM CHLORIDE 0.9% FLUSH
10.0000 mL | Freq: Once | INTRAVENOUS | Status: AC
  Administered 2019-01-31: 10 mL
  Filled 2019-01-31: qty 10

## 2019-01-31 MED ORDER — SODIUM CHLORIDE 0.9 % IV SOLN
Freq: Once | INTRAVENOUS | Status: AC
  Administered 2019-01-31: 10:00:00 via INTRAVENOUS
  Filled 2019-01-31: qty 250

## 2019-01-31 MED ORDER — DIPHENHYDRAMINE HCL 25 MG PO CAPS
ORAL_CAPSULE | ORAL | Status: AC
  Filled 2019-01-31: qty 1

## 2019-01-31 MED ORDER — DIPHENHYDRAMINE HCL 25 MG PO CAPS
25.0000 mg | ORAL_CAPSULE | Freq: Once | ORAL | Status: AC
  Administered 2019-01-31: 25 mg via ORAL

## 2019-01-31 MED ORDER — TRASTUZUMAB CHEMO 150 MG IV SOLR
6.0000 mg/kg | Freq: Once | INTRAVENOUS | Status: AC
  Administered 2019-01-31: 399 mg via INTRAVENOUS
  Filled 2019-01-31: qty 19

## 2019-01-31 NOTE — Telephone Encounter (Signed)
I talk with patient regarding schedule  

## 2019-01-31 NOTE — Patient Instructions (Signed)

## 2019-01-31 NOTE — Patient Instructions (Signed)
[facility] Discharge Instructions for Patients Receiving Chemotherapy  Today you received the following Immunotherapy agents: Herceptin, Perjeta  To help prevent nausea and vomiting after your treatment, we encourage you to take your nausea medication as directed by your MD.   If you develop nausea and vomiting that is not controlled by your nausea medication, call the clinic.   BELOW ARE SYMPTOMS THAT SHOULD BE REPORTED IMMEDIATELY:  *FEVER GREATER THAN 100.5 F  *CHILLS WITH OR WITHOUT FEVER  NAUSEA AND VOMITING THAT IS NOT CONTROLLED WITH YOUR NAUSEA MEDICATION  *UNUSUAL SHORTNESS OF BREATH  *UNUSUAL BRUISING OR BLEEDING  TENDERNESS IN MOUTH AND THROAT WITH OR WITHOUT PRESENCE OF ULCERS  *URINARY PROBLEMS  *BOWEL PROBLEMS  UNUSUAL RASH Items with * indicate a potential emergency and should be followed up as soon as possible.  Feel free to call the clinic should you have any questions or concerns. The clinic phone number is [phone].  Please show the [facility] at check-in to the Emergency Department and triage nurse.  Coronavirus (COVID-19) Are you at risk?  Are you at risk for the Coronavirus (COVID-19)?  To be considered HIGH RISK for Coronavirus (COVID-19), you have to meet the following criteria:  . Traveled to [place], [place], [place], [place] or [place]; or in the [place] to [place], [place], [place], or [place]; and have fever, cough, and shortness of breath within the last 2 weeks of travel OR . Been in close contact with a person diagnosed with COVID-19 within the last 2 weeks and have fever, cough, and shortness of breath . IF YOU DO NOT MEET THESE CRITERIA, YOU ARE CONSIDERED LOW RISK FOR COVID-19.  What to do if you are HIGH RISK for COVID-19?  Marland Kitchen If you are having a medical emergency, call 911. . Seek medical care right away. Before you go to a doctor's office, urgent care or emergency department, call ahead and tell  them about your recent travel, contact with someone diagnosed with COVID-19, and your symptoms. You should receive instructions from your physician's office regarding next steps of care.  . When you arrive at healthcare provider, tell the healthcare staff immediately you have returned from visiting [place], [place], [place], [place] or [place]; or traveled in the [place] to [place], [place], [place], or [place]; in the last two weeks or you have been in close contact with a person diagnosed with COVID-19 in the last 2 weeks.   . Tell the health care staff about your symptoms: fever, cough and shortness of breath. . After you have been seen by a medical provider, you will be either: o Tested for (COVID-19) and discharged home on quarantine except to seek medical care if symptoms worsen, and asked to  - Stay home and avoid contact with others until you get your results (4-5 days)  - Avoid travel on public transportation if possible (such as bus, train, or airplane) or o Sent to the Emergency Department by EMS for evaluation, COVID-19 testing, and possible admission depending on your condition and test results.  What to do if you are LOW RISK for COVID-19?  Reduce your risk of any infection by using the same precautions used for avoiding the common cold or flu:  Marland Kitchen Wash your hands often with soap and warm water for at least 20 seconds.  If soap and water are not readily available, use an alcohol-based hand sanitizer with at least 60% alcohol.  Marland Kitchen  If coughing or sneezing, cover your mouth and nose by coughing or sneezing into the elbow areas of your shirt or coat, into a tissue or into your sleeve (not your hands). . Avoid shaking hands with others and consider head nods or verbal greetings only. . Avoid touching your eyes, nose, or mouth with unwashed hands.  . Avoid close contact with people who are sick. . Avoid places or events with large numbers of people in one location, like concerts or  sporting events. . Carefully consider travel plans you have or are making. . If you are planning any travel outside or inside the [place], visit the CDC's Travelers' Health webpage for the latest health notices. . If you have some symptoms but not all symptoms, continue to monitor at home and seek medical attention if your symptoms worsen. . If you are having a medical emergency, call 911.   [place] / e-Visit: [email or web address]         [facility]: [facility]: [phone]                   [facility]: [phone]

## 2019-01-31 NOTE — Progress Notes (Signed)
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 At that time she underwent a left mastectomy for stage 0 (noninvasive) breast cancer, with transverse rectus abdominis (TRAM) flap construction under Dr. Towanda Malkin. She also underwent a right breast reduction. She took tamoxifen for three years.  More recently she underwent bilateral diagnostic mammography with tomography and left breast ultrasonography at [facility] on 01/03/2018 showing: Breast Density Category B. There is an oval fat containing lesion in the left breast upper outer quadrant posterior depth. No other significant masses, calcifications, or other findings are seen in either breast. Sonographically, there is a 1.5 cm lesion in the left breast upper outer quadrant posterior depth. This lesion is of mixed echogenicity. This correlates as palpated and with mammography findings. Follow up was recommended.  Close follow-up was suggested.  She then presented with a non-tender mass in the left reconstructed breast on 08/30/2018. On physical exam, there is a hard palpable lump measuring 2.0 cm in the upper outer left reconstructed breast 10 cm from the expected location of a nipple. Sonography over this  area demonstrates a 1.8 cm x 1.7 cm x 1.4 cm mass in the left breast at 2 o'clock posterior depth 10 cm from the nipple. This mass is of mixed echogenicity. This abnormality is increased in size and correlates as palpated and with prior mammography findings. Color flow imaging demonstrates that there is vascularity present. Elastography imaging assessment is intermediate. No significant abnormalities were seen sonographically in the left axilla.    Accordingly on 08/30/2018 she proceeded to biopsy of the left breast mass in question. The pathology from this procedure showed ([id number]): invasive ductal carcinoma, grade III. Prognostic indicators significant for: estrogen receptor, 100% positive with strong staining intensity and progesterone receptor, 0% negative. Proliferation marker Ki67 at 15%. HER2 positive (3+) by immunohistochemistry.  The patient's subsequent history is as detailed below.   INTERVAL HISTORY: Paige Foster returns today for follow up of her breast cancer and evaluation prior to receiving Trastuzumab and Pertuzumab.  She underwent a pars plana lensectomy 12/18/2018 under Dr. Posey Pronto.  He is currently being treated for glaucoma through Dr. Loel Dubonnet at the [facility].  She says her vision is slowly improving and she is following closely with her eye doctor.  Her last echocardiogram was on 01/23/2019 and showed an EF of 60-65%.  She opted to forego her last two cycles of neoadjuvant chemotherapy due to her eye condition.  She underwent post neoadjuvant breast MRI on 01/26/2019 that showed no change in her breast mass.     REVIEW OF SYSTEMS: Akaylah is doing well today.  She says that she is feeling well.  She says that a couple of days after her immunotherapy she will feel achy, but otherwise she is tolerating it quite well.  She met with Dr. Donne Hazel on 01/29/2019 and reviewed the  MRI and upcoming breast cancer surgery.  She tells me that she will undergo lumpectomy on  02/20/2019.  She denies any questions about this plan.    Sabrine denies any fever, chills, chest pain, cough, shortness of breath, palpitations, lower extremity swelling, nausea, vomiting, bowel/bladder changes.  She is eating and drinking moderately well.  Delainie lives at home with her sons and husband.  She gets some help from them when she needs it.  She is independent in her ADLs and is able to drive.  Her vision is improving, which is promising.  She is still wearing sunglasses when she leaves the house.    PAST MEDICAL HISTORY: Past Medical History:  Diagnosis Date   History of colon polyps    Hypertension    Recurrent breast cancer, left [facility]) oncologist-- dr Jana Hakim    dx 2004, noninvasive Stage 0 ----s/p left mastectomy w/ tram flap construction (and right breast reduction), taken Tamoxifen for 3 yrs;   08-30-2018 recurrent left cancer , Grade III,  cT1c,  ER positive, PR negative, HER-2 positive, invasive ductal carcinoma-- neoadjuvant chemo to start 09-26-2018   Renal artery stenosis (HCC)    mild right external renal artery stenosis per duplex in epic 08-09-2013   Wears glasses     PAST SURGICAL HISTORY: Past Surgical History:  Procedure Laterality Date   COLONOSCOPY     MASTECTOMY Left 2004   w/  TRAM flap construction and right breast augmentation with abdominoplasy   PARS PLANA VITRECTOMY Left 12/18/2018   Procedure: PARS PLANA VITRECTOMY WITH 25 GAUGE, ENDOLASER;  Surgeon: Jalene Mullet, MD;  Location: [facility];  Service: Ophthalmology;  Laterality: Left;   PORTACATH PLACEMENT N/A 09/25/2018   Procedure: INSERTION PORT-A-CATH WITH ULTRASOUND;  Surgeon: Rolm Bookbinder, MD;  Location: [facility];  Service: General;  Laterality: N/A;   TUBAL LIGATION Bilateral yrs ago     FAMILY HISTORY: Family History  Problem Relation Age of Onset   Diabetes Mother    Hypertension Mother    Kidney disease Father    Colon cancer Neg Hx    Colon polyps Neg Hx     Gallbladder disease Neg Hx    Heart disease Neg Hx    Esophageal cancer Neg Hx    Curley's father died from unknown causes in his early 70's. Patients' mother died from diabetes complications at age 32. The patient has 1 sister. Patient denies anyone in her family having breast, ovarian, prostate, or pancreatic cancer.    GYNECOLOGIC HISTORY:  Patient's last menstrual period was 06/08/2007. 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 Her husband, Elwin Mocha, works at [employer]. Finlay has two children, Vonna Kotyk and Shanon Brow. Vonna Kotyk lives with her, is 48, and it attending [employer] for a computer based degree. Shanon Brow lives with her, is 15, and recently graduated from [employer] with a degree in Careers information officer. Nikeria has no grandchildren. She attends the [employer].   ADVANCED DIRECTIVES: Her husband, Elwin Mocha, is automatically her healthcare power of attorney     HEALTH MAINTENANCE: Social History   Tobacco Use   Smoking status: Never Smoker   Smokeless tobacco: Never Used  Substance Use Topics   Alcohol use: Not Currently    Alcohol/week: 0.0 standard drinks    Comment: Occassionally   Drug use: No  Colonoscopy: yes  PAP:   Bone density: yes, 2017; -1.6, osteopenic   No Known Allergies  Current Outpatient Medications  Medication Sig Dispense Refill   amLODipine (NORVASC) 10 MG tablet Take 1 tablet (10 mg total) by mouth at bedtime. 90 tablet 1   Flaxseed, Linseed, (FLAXSEED OIL) 1000 MG CAPS Take 1,000 mg by mouth daily.     Lifitegrast (XIIDRA) 5 % SOLN Apply 1 drop to eye every 12 (twelve) hours. 1 each 0   LORazepam (ATIVAN) 0.5 MG tablet Take 1 tablet (0.5 mg total) by mouth every 8 (eight) hours. 30 tablet 0   losartan (COZAAR) 50 MG tablet TAKE 1 TABLET(50 MG) BY MOUTH DAILY 90  tablet 1   Nutritional Supplements (GRAPESEED EXTRACT PO) Take 1 capsule by mouth daily.     Resveratrol 250 MG CAPS Take 250 mg by mouth daily.     TURMERIC PO Take 1,000 mg by mouth daily.     valACYclovir (VALTREX) 500 MG tablet Take 1 tablet (500 mg total) by mouth 2 (two) times daily. 60 tablet 0   No current facility-administered medications for this visit.      OBJECTIVE:   Vitals:   01/31/19 0835  BP: 118/66  Pulse: 63  Resp: 18  Temp: 98.2 F (36.8 C)  SpO2: 100%     Body mass index is 23.91 kg/m.   Wt Readings from Last 3 Encounters:  01/31/19 139 lb 4.8 oz (63.2 kg)  01/11/19 144 lb 9.6 oz (65.6 kg)  12/17/18 155 lb (70.3 kg)  ECOG FS:1 GENERAL: Patient is a well appearing female in no acute distress HEENT:  Eyes not examined, patient wearing sunglasses.  Oropharynx clear and moist. No ulcerations or evidence of oropharyngeal candidiasis. Neck is supple.  NODES:  No cervical, supraclavicular, or axillary lymphadenopathy palpated.  BREAST EXAM:  2cm mass in left upper outer breast noted, otherwise benign LUNGS:  Clear to auscultation bilaterally.  No wheezes or rhonchi. HEART:  Regular rate and rhythm. No murmur appreciated. ABDOMEN:  Soft, nontender.  Positive, normoactive bowel sounds. No organomegaly palpated. MSK:  No focal spinal tenderness to palpation. Full range of motion bilaterally in the upper extremities. EXTREMITIES:  No peripheral edema.   SKIN:  Clear with no obvious rashes or skin changes. No nail dyscrasia. NEURO:  Nonfocal. Well oriented.  Appropriate affect.      LAB RESULTS:  CMP     Component Value Date/Time   NA 140 01/12/2019 1020   NA 140 10/12/2017 0950   K 3.9 01/12/2019 1020   CL 107 01/12/2019 1020   CO2 26 01/12/2019 1020   GLUCOSE 92 01/12/2019 1020   BUN 5 (L) 01/12/2019 1020   BUN 10 10/12/2017 0950   CREATININE 0.71 01/12/2019 1020   CREATININE 0.78 09/07/2018 1455   CALCIUM 9.1 01/12/2019 1020   PROT 7.0  01/12/2019 1020   PROT 7.5 10/12/2017 0950   ALBUMIN 3.8 01/12/2019 1020   ALBUMIN 4.3 10/12/2017 0950   AST 12 (L) 01/12/2019 1020   AST 14 (L) 09/07/2018 1455   ALT 10 01/12/2019 1020   ALT 13 09/07/2018 1455   ALKPHOS 83 01/12/2019 1020   BILITOT 0.2 (L) 01/12/2019 1020   BILITOT 0.2 (L) 09/07/2018 1455   GFRNONAA >60 01/12/2019 1020   GFRNONAA >60 09/07/2018 1455   GFRAA >60 01/12/2019 1020   GFRAA >60 09/07/2018 1455    No results found for: TOTALPROTELP, ALBUMINELP, A1GS, A2GS, BETS, BETA2SER, GAMS, MSPIKE, SPEI  No results  found for: Nils Pyle, [facility]  Lab Results  Component Value Date   WBC 6.3 01/31/2019   NEUTROABS 3.3 01/31/2019   HGB 11.5 (L) 01/31/2019   HCT 34.7 (L) 01/31/2019   MCV 100.3 (H) 01/31/2019   PLT 252 01/31/2019    Lab Results  Component Value Date   LABCA2 <4 08/30/2007    No components found for: [id number]  No results for input(s): INR in the last 168 hours.  Lab Results  Component Value Date   LABCA2 <4 08/30/2007    No results found for: KGY185  No results found for: UDJ497  No results found for: WYO378  No results found for: CA2729  No components found for: HGQUANT  No results found for: CEA1 / No results found for: CEA1   No results found for: AFPTUMOR  No results found for: CHROMOGRNA  No results found for: PSA1  Appointment on 01/31/2019  Component Date Value Ref Range Status   WBC 01/31/2019 6.3  4.0 - 10.5 K/uL Final   RBC 01/31/2019 3.46* 3.87 - 5.11 MIL/uL Final   Hemoglobin 01/31/2019 11.5* 12.0 - 15.0 g/dL Final   HCT 01/31/2019 34.7* 36.0 - 46.0 % Final   MCV 01/31/2019 100.3* 80.0 - 100.0 fL Final   MCH 01/31/2019 33.2  26.0 - 34.0 pg Final   MCHC 01/31/2019 33.1  30.0 - 36.0 g/dL Final   RDW 01/31/2019 12.6  11.5 - 15.5 % Final   Platelets 01/31/2019 252  150 - 400 K/uL Final   nRBC 01/31/2019 0.0  0.0 - 0.2 % Final   Neutrophils Relative % 01/31/2019 52  % Final    Neutro Abs 01/31/2019 3.3  1.7 - 7.7 K/uL Final   Lymphocytes Relative 01/31/2019 39  % Final   Lymphs Abs 01/31/2019 2.5  0.7 - 4.0 K/uL Final   Monocytes Relative 01/31/2019 8  % Final   Monocytes Absolute 01/31/2019 0.5  0.1 - 1.0 K/uL Final   Eosinophils Relative 01/31/2019 1  % Final   Eosinophils Absolute 01/31/2019 0.1  0.0 - 0.5 K/uL Final   Basophils Relative 01/31/2019 0  % Final   Basophils Absolute 01/31/2019 0.0  0.0 - 0.1 K/uL Final   Immature Granulocytes 01/31/2019 0  % Final   Abs Immature Granulocytes 01/31/2019 0.01  0.00 - 0.07 K/uL Final   Performed at [facility], [place] [address], [place], [place] [address]    (this displays the last labs from the last 3 days)  No results found for: TOTALPROTELP, ALBUMINELP, A1GS, A2GS, BETS, BETA2SER, GAMS, MSPIKE, SPEI (this displays SPEP labs)  No results found for: KPAFRELGTCHN, LAMBDASER, KAPLAMBRATIO (kappa/lambda light chains)  No results found for: HGBA, HGBA2QUANT, HGBFQUANT, HGBSQUAN (Hemoglobinopathy evaluation)   Lab Results  Component Value Date   LDH 169 08/30/2007    Lab Results  Component Value Date   IRON 78 01/01/2009   TIBC 424 08/30/2007   IRONPCTSAT 17.9 (L) 01/01/2009   (Iron and TIBC)  Lab Results  Component Value Date   FERRITIN 19 08/30/2007    Urinalysis    Component Value Date/Time   LABSPEC 1.015 04/09/2008 1548   PHURINE 7.0 04/09/2008 1548   HGBUR large 04/09/2008 1548   BILIRUBINUR negative 04/09/2008 1548   UROBILINOGEN 0.2 04/09/2008 1548   NITRITE negative 04/09/2008 1548     STUDIES:  Mr Breast Bilateral W Wo Contrast Inc Cad  Result Date: 01/27/2019 CLINICAL DATA:  Patient with history of recurrent left breast cancer diagnosed 08/2018.  Patient has history of left mastectomy and tram flap reconstruction. Evaluate response to neoadjuvant chemotherapy. EXAM: BILATERAL BREAST MRI WITH AND WITHOUT CONTRAST TECHNIQUE: Multiplanar,  multisequence MR images of both breasts were obtained prior to and following the intravenous administration of 7 ml of Gadavist Three-dimensional MR images were rendered by post-processing of the original MR data on an independent workstation. The three-dimensional MR images were interpreted, and findings are reported in the following complete MRI report for this study. Three dimensional images were evaluated at the independent DynaCad workstation COMPARISON:  Previous exam(s).  MRI breast 09/17/2018 FINDINGS: Breast composition: b. Scattered fibroglandular tissue. Background parenchymal enhancement: Minimal Right breast: No mass or abnormal enhancement. Left breast: Patient status post left mastectomy and tram flap reconstruction. Mass within the upper-outer reconstructed left breast is similar when compared to prior exam measuring 2.0 x 2.0 x 1.9 cm. No additional suspicious areas of enhancement identified within the left breast. Lymph nodes: No abnormal appearing lymph nodes. Ancillary findings:  None. IMPRESSION: Grossly similar-appearing mass within the upper-outer reconstructed left breast measuring approximately 2.0 cm. No evidence for additional disease within the reconstructed left breast or right breast. RECOMMENDATION: Treatment plan for known left breast malignancy. BI-RADS CATEGORY  6: Known biopsy-proven malignancy. Electronically Signed   By: Lovey Newcomer M.D.   On: 01/27/2019 13:14    ELIGIBLE FOR AVAILABLE RESEARCH PROTOCOL:    ASSESSMENT: 53 y.o. [place], [place] woman  (1) history of left-sided ductal carcinoma in situ 2004  (a) s/pleft mastectomy with TRAM reconstruction  (b) status post tamoxifen x3 years  (2) left breast upper outer quadrant biopsy 08/30/2018 shows a clinical T1c N0 invasive ductal carcinoma, grade 3, estrogen receptor positive, progesterone receptor negative, with HER-2 amplification, and and MIB-1 of 15%.   (3) neoadjuvant chemotherapy will consist of carboplatin,  docetaxel, trastuzumab and Pertuzumab starting 09/26/2018, repeated every 21 days x 6.  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    (b) chemotherapy discontinued after 4 cycles because of intercurrent eye surgery  (c) continuing trastuzumab and Pertuzumab to complete a year  (d) echocardiogram on 01/23/2019 that shows well preserved EF of 60-65%  (4) definitive surgery to follow on 02/20/2019  (5) adjuvant radiation therapy as appropriate  (6) antiestrogens to follow at the completion of local treatment   PLAN: Mylynn is doing much better than she has previously been.  Her labs are improving.  She has been scheduled for her upcoming lumpectomy on 02/20/2019.  She continues on  Trastuzumab/Pertuzumab with good tolerance.  I reviewed with Mistina the difference between chemotherapy and immunotherapy in detail.  I talked to her about the role of Trastuzumab/Perjtuzumab and the indication of it with her breast cancer.  I reviewed the role that echocardiograms will play in monitoring for any decrease in her EF.  She understands this.  Lounell's vision is improving and she is happy about this.  We reviewed her risk of complications from [id number] and I recommended that she continue to follow appropriate pandemic precautions.   Velena will return on 03/01/2019 for labs, f/u with myself or Dr. Jana Hakim, and her next Trastuzumab/Pertuzumab.  We are delaying this by one week so that she can heal from her surgery that is scheduled on 02/20/2019.  She knows to call for any other issue that may develop before her return visit here.  A total of (30) minutes of face-to-face time was spent with this patient with greater than 50% of  that time in counseling and care-coordination.    Scot Dock, NP Medical Oncology and Hematology [facility] [address] [place], [place] [address] Tel. [phone]    Fax. [phone]

## 2019-02-01 ENCOUNTER — Ambulatory Visit: Payer: BC Managed Care – PPO

## 2019-02-01 ENCOUNTER — Ambulatory Visit: Payer: BC Managed Care – PPO | Admitting: Adult Health

## 2019-02-01 ENCOUNTER — Other Ambulatory Visit: Payer: BC Managed Care – PPO

## 2019-02-05 ENCOUNTER — Ambulatory Visit: Payer: Self-pay | Admitting: Licensed Clinical Social Worker

## 2019-02-05 ENCOUNTER — Encounter: Payer: Self-pay | Admitting: Licensed Clinical Social Worker

## 2019-02-05 ENCOUNTER — Telehealth: Payer: Self-pay | Admitting: Licensed Clinical Social Worker

## 2019-02-05 DIAGNOSIS — C50412 Malignant neoplasm of upper-outer quadrant of left female breast: Secondary | ICD-10-CM

## 2019-02-05 DIAGNOSIS — Z1379 Encounter for other screening for genetic and chromosomal anomalies: Secondary | ICD-10-CM

## 2019-02-05 DIAGNOSIS — C50912 Malignant neoplasm of unspecified site of left female breast: Secondary | ICD-10-CM

## 2019-02-05 NOTE — Telephone Encounter (Signed)
Revealed negative genetic testing.  This normal result is reassuring and indicates that it is unlikely Paige Foster's cancer is due to a hereditary cause.  It is unlikely that there is an increased risk of another cancer due to a mutation in one of these genes.  However, genetic testing is not perfect, and cannot definitively rule out a hereditary cause.  It will be important for her to keep in contact with genetics to learn if any additional testing may be needed in the future.

## 2019-02-05 NOTE — Progress Notes (Signed)
HPI:  Paige Foster was previously seen in the [place] clinic due to a personal history of breast cancer and concerns regarding a hereditary predisposition to cancer. Please refer to our prior cancer genetics clinic note for more information regarding our discussion, assessment and recommendations, at the time. Ms. Callas's recent genetic test results were disclosed to her, as were recommendations warranted by these results. These results and recommendations are discussed in more detail below.  CANCER HISTORY:  Oncology History  Malignant neoplasm of upper-outer quadrant of left breast in female, estrogen receptor positive ([place])  08/30/2018 Cancer Staging   Staging form: Breast, AJCC 8th Edition - Clinical stage from 08/30/2018: Stage IA (cT1c, cN0, cM0, G3, ER+, PR-, HER2+) - Signed by Gardenia Phlegm, NP on 01/31/2019   09/06/2018 Initial Diagnosis   Malignant neoplasm of upper-outer quadrant of left breast in female, estrogen receptor positive ([place])   09/29/2018 - 12/28/2018 Chemotherapy   The patient had palonosetron (ALOXI) injection 0.25 mg, 0.25 mg, Intravenous,  Once, 4 of 6 cycles Administration: 0.25 mg (09/29/2018), 0.25 mg (10/20/2018), 0.25 mg (11/09/2018), 0.25 mg (12/01/2018) pegfilgrastim-cbqv (UDENYCA) injection 6 mg, 6 mg, Subcutaneous, Once, 4 of 6 cycles Administration: 6 mg (10/02/2018), 6 mg (10/23/2018), 6 mg (11/10/2018), 6 mg (12/04/2018) trastuzumab (HERCEPTIN) 600 mg in sodium chloride 0.9 % 250 mL chemo infusion, 609 mg, Intravenous,  Once, 4 of 6 cycles Administration: 600 mg (09/29/2018), 450 mg (10/20/2018), 450 mg (11/09/2018), 450 mg (12/01/2018) CARBOplatin (PARAPLATIN) 600 mg in sodium chloride 0.9 % 250 mL chemo infusion, 600 mg (100 % of original dose 604 mg), Intravenous,  Once, 4 of 6 cycles Dose modification:   (original dose 604 mg, Cycle 1) Administration: 600 mg (09/29/2018), 600 mg (10/20/2018), 600 mg (11/09/2018), 600 mg (12/01/2018) DOCEtaxel (TAXOTERE)  140 mg in sodium chloride 0.9 % 250 mL chemo infusion, 75 mg/m2 = 140 mg, Intravenous,  Once, 3 of 3 cycles Administration: 140 mg (09/29/2018), 140 mg (10/20/2018), 140 mg (11/09/2018) gemcitabine (GEMZAR) 1,482 mg in sodium chloride 0.9 % 250 mL chemo infusion, 800 mg/m2 = 1,482 mg (100 % of original dose 800 mg/m2), Intravenous,  Once, 1 of 3 cycles Dose modification: 800 mg/m2 (original dose 800 mg/m2, Cycle 4, Reason: Provider Judgment) Administration: 1,482 mg (12/01/2018) pertuzumab (PERJETA) 840 mg in sodium chloride 0.9 % 250 mL chemo infusion, 840 mg, Intravenous, Once, 4 of 6 cycles Administration: 840 mg (09/29/2018), 420 mg (10/20/2018), 420 mg (11/09/2018), 420 mg (12/01/2018) fosaprepitant (EMEND) 150 mg, dexamethasone (DECADRON) 12 mg in sodium chloride 0.9 % 145 mL IVPB, , Intravenous,  Once, 4 of 6 cycles Administration:  (09/29/2018),  (10/20/2018),  (11/09/2018),  (12/01/2018)  for chemotherapy treatment.    01/12/2019 -  Chemotherapy   The patient had trastuzumab (HERCEPTIN) 525 mg in sodium chloride 0.9 % 250 mL chemo infusion, 8 mg/kg = 525 mg, Intravenous,  Once, 2 of 5 cycles Administration: 525 mg (01/12/2019), 399 mg (01/31/2019) pertuzumab (PERJETA) 420 mg in sodium chloride 0.9 % 250 mL chemo infusion, 420 mg (100 % of original dose 420 mg), Intravenous, Once, 2 of 5 cycles Dose modification: 420 mg (original dose 420 mg, Cycle 1, Reason: Provider Judgment) Administration: 420 mg (01/12/2019), 420 mg (01/31/2019)  for chemotherapy treatment.     Genetic Testing   Negative testing. No pathogenic variants identified on the Common Hereditary Cancers Panel. The Common Hereditary Cancers Panel offered by [facility] includes sequencing and/or deletion duplication testing of the following 48 genes: APC, ATM, AXIN2,  BARD1, BMPR1A, BRCA1, BRCA2, BRIP1, CDH1, CDKN2A (p14ARF), CDKN2A (p16INK4a), CKD4, CHEK2, CTNNA1, DICER1, EPCAM (Deletion/duplication testing only), GREM1 (promoter region  deletion/duplication testing only), KIT, MEN1, MLH1, MSH2, MSH3, MSH6, MUTYH, NBN, NF1, NHTL1, PALB2, PDGFRA, PMS2, POLD1, POLE, PTEN, RAD50, RAD51C, RAD51D, RNF43, SDHB, SDHC, SDHD, SMAD4, SMARCA4. STK11, TP53, TSC1, TSC2, and VHL.  The following genes were evaluated for sequence changes only: SDHA and HOXB13 c.251G>A variant only. The report date is 02/03/2019.   Recurrent breast cancer, left ([place])  09/07/2018 Initial Diagnosis   Recurrent breast cancer, left ([place])   09/29/2018 - 12/28/2018 Chemotherapy   The patient had palonosetron (ALOXI) injection 0.25 mg, 0.25 mg, Intravenous,  Once, 4 of 6 cycles Administration: 0.25 mg (09/29/2018), 0.25 mg (10/20/2018), 0.25 mg (11/09/2018), 0.25 mg (12/01/2018) pegfilgrastim-cbqv (UDENYCA) injection 6 mg, 6 mg, Subcutaneous, Once, 4 of 6 cycles Administration: 6 mg (10/02/2018), 6 mg (10/23/2018), 6 mg (11/10/2018), 6 mg (12/04/2018) trastuzumab (HERCEPTIN) 600 mg in sodium chloride 0.9 % 250 mL chemo infusion, 609 mg, Intravenous,  Once, 4 of 6 cycles Administration: 600 mg (09/29/2018), 450 mg (10/20/2018), 450 mg (11/09/2018), 450 mg (12/01/2018) CARBOplatin (PARAPLATIN) 600 mg in sodium chloride 0.9 % 250 mL chemo infusion, 600 mg (100 % of original dose 604 mg), Intravenous,  Once, 4 of 6 cycles Dose modification:   (original dose 604 mg, Cycle 1) Administration: 600 mg (09/29/2018), 600 mg (10/20/2018), 600 mg (11/09/2018), 600 mg (12/01/2018) DOCEtaxel (TAXOTERE) 140 mg in sodium chloride 0.9 % 250 mL chemo infusion, 75 mg/m2 = 140 mg, Intravenous,  Once, 3 of 3 cycles Administration: 140 mg (09/29/2018), 140 mg (10/20/2018), 140 mg (11/09/2018) gemcitabine (GEMZAR) 1,482 mg in sodium chloride 0.9 % 250 mL chemo infusion, 800 mg/m2 = 1,482 mg (100 % of original dose 800 mg/m2), Intravenous,  Once, 1 of 3 cycles Dose modification: 800 mg/m2 (original dose 800 mg/m2, Cycle 4, Reason: Provider Judgment) Administration: 1,482 mg (12/01/2018) pertuzumab (PERJETA) 840 mg in sodium  chloride 0.9 % 250 mL chemo infusion, 840 mg, Intravenous, Once, 4 of 6 cycles Administration: 840 mg (09/29/2018), 420 mg (10/20/2018), 420 mg (11/09/2018), 420 mg (12/01/2018) fosaprepitant (EMEND) 150 mg, dexamethasone (DECADRON) 12 mg in sodium chloride 0.9 % 145 mL IVPB, , Intravenous,  Once, 4 of 6 cycles Administration:  (09/29/2018),  (10/20/2018),  (11/09/2018),  (12/01/2018)  for chemotherapy treatment.      FAMILY HISTORY:  We obtained a detailed, 4-generation family history.  Significant diagnoses are listed below: Family History  Problem Relation Age of Onset  . Diabetes Mother   . Hypertension Mother   . Kidney disease Father   . Colon cancer Neg Hx   . Colon polyps Neg Hx   . Gallbladder disease Neg Hx   . Heart disease Neg Hx   . Esophageal cancer Neg Hx     Ms. Whitebread has 2 sons, ages 55 and 66, no history of cancer. She has one sister, age 65, who has no history of cancer. She has 3 nieces.   Ms. Marquard's mother died at 73, no history of cancer. The patient had 6 maternal aunts, 2 maternal uncles, she is unaware of cancer history for any of them. No known cancers in her maternal cousins. Her maternal grandmother died at 70, maternal grandfather died in his 74s.  Ms. Reitan's father died in his 4s, no history of cancer. The patient had 1 maternal uncle who died in his 46s, no history of cancer. She is not aware of cancers in paternal cousins.  She does not know how old her paternal grandparents were when they died or whether they had cancer.  Ms. Rosekrans is unaware of previous family history of genetic testing for hereditary cancer risks.. There is no reported Ashkenazi Jewish ancestry. There is no known consanguinity.  GENETIC TEST RESULTS: Genetic testing reported out on 02/03/2019 through the Common Hereditary cancer panel found no pathogenic mutations.  The Common Hereditary Cancers Panel offered by [facility] includes sequencing and/or deletion duplication testing of the  following 48 genes: APC, ATM, AXIN2, BARD1, BMPR1A, BRCA1, BRCA2, BRIP1, CDH1, CDKN2A (p14ARF), CDKN2A (p16INK4a), CKD4, CHEK2, CTNNA1, DICER1, EPCAM (Deletion/duplication testing only), GREM1 (promoter region deletion/duplication testing only), KIT, MEN1, MLH1, MSH2, MSH3, MSH6, MUTYH, NBN, NF1, NHTL1, PALB2, PDGFRA, PMS2, POLD1, POLE, PTEN, RAD50, RAD51C, RAD51D, RNF43, SDHB, SDHC, SDHD, SMAD4, SMARCA4. STK11, TP53, TSC1, TSC2, and VHL.  The following genes were evaluated for sequence changes only: SDHA and HOXB13 c.251G>A variant only.  The test report has been scanned into EPIC and is located under the Molecular Pathology section of the Results Review tab.  A portion of the result report is included below for reference.    We discussed with Ms. Janus that because current genetic testing is not perfect, it is possible there may be a gene mutation in one of these genes that current testing cannot detect, but that chance is small.  We also discussed, that there could be another gene that has not yet been discovered, or that we have not yet tested, that is responsible for the cancer diagnoses in the family. It is also possible there is a hereditary cause for the cancer in the family that Ms. Teicher did not inherit and therefore was not identified in her testing.  Therefore, it is important to remain in touch with cancer genetics in the future so that we can continue to offer Ms. Bryk the most up to date genetic testing.   ADDITIONAL GENETIC TESTING: We discussed with Ms. Parma that her genetic testing was fairly extensive.  If there are genes identified to increase cancer risk that can be analyzed in the future, we would be happy to discuss and coordinate this testing at that time.    CANCER SCREENING RECOMMENDATIONS: Ms. Cornette's test result is considered negative (normal).  This means that we have not identified a hereditary cause for her  Personal history of cancer at this time. Most cancers  happen by chance and this negative test suggests that her cancer may fall into this category.    While reassuring, this does not definitively rule out a hereditary predisposition to cancer. It is still possible that there could be genetic mutations that are undetectable by current technology. There could be genetic mutations in genes that have not been tested or identified to increase cancer risk.  Therefore, it is recommended she continue to follow the cancer management and screening guidelines provided by her oncology and primary healthcare provider.   An individual's cancer risk and medical management are not determined by genetic test results alone. Overall cancer risk assessment incorporates additional factors, including personal medical history, family history, and any available genetic information that may result in a personalized plan for cancer prevention and surveillance  RECOMMENDATIONS FOR FAMILY MEMBERS:  Relatives in this family might be at some increased risk of developing cancer, over the general population risk, simply due to the family history of cancer.  We recommended female relatives in this family have a yearly mammogram beginning at age 14, or 9 years  younger than the earliest onset of cancer, an annual clinical breast exam, and perform monthly breast self-exams. Female relatives in this family should also have a gynecological exam as recommended by their primary provider. All family members should have a colonoscopy by age 68, or as directed by their physicians.  FOLLOW-UP: Lastly, we discussed with Ms. Crothers that cancer genetics is a rapidly advancing field and it is possible that new genetic tests will be appropriate for her and/or her family members in the future. We encouraged her to remain in contact with cancer genetics on an annual basis so we can update her personal and family histories and let her know of advances in cancer genetics that may benefit this family.   Our  contact number was provided. Ms. Amato's questions were answered to her satisfaction, and she knows she is welcome to call us at anytime with additional questions or concerns.   Faith Rogue, MS, [facility] [facility] Counselor [email or web address] Phone: [phone]

## 2019-02-12 NOTE — Progress Notes (Signed)
[id number] [address], [address] AT [facility] OF [address] [place] [address] Phone: [phone] Fax: [phone]      Your procedure is scheduled on July 28  Report to [facility] Main Entrance "A" at 1200 P.M., and check in at the Admitting office.  Call this number if you have problems the morning of surgery:  [phone]  Call [phone] if you have any questions prior to your surgery date Monday-Friday 8am-4pm    Remember:  Do not eat or drink after midnight the night before your surgery    Take these medicines the morning of surgery with A SIP OF WATER  Eye drops if needed  7 days prior to surgery STOP taking any Aspirin (unless otherwise instructed by your surgeon), Aleve, Naproxen, Ibuprofen, Motrin, Advil, Goody's, BC's, all herbal medications, fish oil, and all vitamins.  [place]- Preparing For Surgery  Before surgery, you can play an important role. Because skin is not sterile, your skin needs to be as free of germs as possible. You can reduce the number of germs on your skin by washing with CHG (chlorahexidine gluconate) Soap before surgery.  CHG is an antiseptic cleaner which kills germs and bonds with the skin to continue killing germs even after washing.    Oral Hygiene is also important to reduce your risk of infection.  Remember - BRUSH YOUR TEETH THE MORNING OF SURGERY WITH YOUR REGULAR TOOTHPASTE  Please do not use if you have an allergy to CHG or antibacterial soaps. If your skin becomes reddened/irritated stop using the CHG.  Do not shave (including legs and underarms) for at least 48 hours prior to first CHG shower. It is OK to shave your face.  Please follow these instructions carefully.   1. Shower the NIGHT BEFORE SURGERY and the MORNING OF SURGERY with CHG Soap.   2. If you chose to wash your hair, wash your hair first as usual with your normal shampoo.  3. After you shampoo, rinse  your hair and body thoroughly to remove the shampoo.  4. Use CHG as you would any other liquid soap. You can apply CHG directly to the skin and wash gently with a scrungie or a clean washcloth.   5. Apply the CHG Soap to your body ONLY FROM THE NECK DOWN.  Do not use on open wounds or open sores. Avoid contact with your eyes, ears, mouth and genitals (private parts). Wash Face and genitals (private parts)  with your normal soap.   6. Wash thoroughly, paying special attention to the area where your surgery will be performed.  7. Thoroughly rinse your body with warm water from the neck down.  8. DO NOT shower/wash with your normal soap after using and rinsing off the CHG Soap.  9. Pat yourself dry with a CLEAN TOWEL.  10. Wear CLEAN PAJAMAS to bed the night before surgery, wear comfortable clothes the morning of surgery  11. Place CLEAN SHEETS on your bed the night of your first shower and DO NOT SLEEP WITH PETS.    Day of Surgery:  Do not apply any deodorants/lotions. Please shower the morning of surgery with the CHG soap  Please wear clean clothes to the hospital/surgery center.   Remember to brush your teeth WITH YOUR REGULAR TOOTHPASTE.    The Morning of Surgery  Do not wear jewelry, make-up or nail polish.  Do not wear lotions, powders, or perfumes/colognes,  or deodorant  Do not shave 48 hours prior to surgery.  Men may shave face and neck.  Do not bring valuables to the hospital.  [facility] is not responsible for any belongings or valuables.  If you are a smoker, DO NOT Smoke 24 hours prior to surgery IF you wear a CPAP at night please bring your mask, tubing, and machine the morning of surgery   Remember that you must have someone to transport you home after your surgery, and remain with you for 24 hours if you are discharged the same day.   Contacts, glasses, hearing aids, dentures or bridgework may not be worn into surgery.    Leave your suitcase in the car.   After surgery it may be brought to your room.  For patients admitted to the hospital, discharge time will be determined by your treatment team.  Patients discharged the day of surgery will not be allowed to drive home.    Special instructions:   Please read over the following fact sheets that you were given.

## 2019-02-12 NOTE — Progress Notes (Addendum)
Your procedure is scheduled on July 28  Report to [facility] Main Entrance "A" at 1200 P.M., and check in at the Admitting office.             Your surgery or procedure is scheduled for 2:00 PM Call this number if you have problems the morning of surgery:  [phone]  Call [phone] if you have any questions prior to your surgery date Monday-Friday 8am-4pm    Remember:  Do not eat  after midnight the night before your surgery  You may drink clear liquids until 11:00 AM  Clear liquids consist of:  Carbonated Beverages, Water, Clear Tea, Black Coffee only, Juice (non-citric and without pulp), Gatorade, Plain Jell-O  only, Plain Popsicles only. Drink Pre- Surgery Ensure between 10:30 and 11:00 am.  Take these medicines the morning of surgery with A SIP OF WATER: Amlodipine Eye drops  Lorazepam if needed  7 days prior to surgery STOP taking any Aspirin (unless otherwise instructed by your surgeon), Aleve, Naproxen, Ibuprofen, Motrin, Advil, Goody's, BC's, all herbal medications, fish oil, and all vitamins.  [place]- Preparing For Surgery  Before surgery, you can play an important role. Because skin is not sterile, your skin needs to be as free of germs as possible. You can reduce the number of germs on your skin by washing with CHG (chlorahexidine gluconate) Soap before surgery.  CHG is an antiseptic cleaner which kills germs and bonds with the skin to continue killing germs even after washing.    Oral Hygiene is also important to reduce your risk of infection.  Remember - BRUSH YOUR TEETH THE MORNING OF SURGERY WITH YOUR REGULAR TOOTHPASTE  Please do not use if you have an allergy to CHG or antibacterial soaps. If your skin becomes reddened/irritated stop using the CHG.  Do not shave (including legs and underarms) for at least 48 hours prior to first CHG shower. It is OK to shave your face.  Please follow these instructions carefully.   1. Shower the NIGHT BEFORE SURGERY  and the MORNING OF SURGERY with CHG Soap.   2. If you chose to wash your hair, wash your hair first as usual with your normal shampoo.  3. After you shampoo, . washrinse your hair and body thoroughly to remove the shampoo.  4. Use CHG as you would any other liquid soap. You can apply CHG directly to the skin and wash gently with a scrungie or a clean washcloth.   5. Apply the CHG Soap to your body ONLY FROM THE NECK DOWN.  Do not use on open wounds or open sores. Avoid contact with your eyes, ears, mouth and genitals (private parts). Wash Face and genitals (private parts)  with your normal soap.   6. Wash thoroughly, paying special attention to the area where your surgery will be performed.  7. Thoroughly rinse your body with warm water from the neck down.  8. DO NOT shower/wash with your normal soap after using and rinsing off the CHG Soap.  9. Pat yourself dry with a CLEAN TOWEL.  10. Wear CLEAN PAJAMAS to bed the night before surgery, wear comfortable clothes the morning of surgery  11. Place CLEAN SHEETS on your bed the night of your first shower and DO NOT SLEEP WITH PETS.    Day of Surgery:  Do not apply any deodorants/lotions. Please shower the morning of surgery with the CHG soap  Please wear clean clothes to the hospital/surgery center.   Remember  to brush your teeth WITH YOUR REGULAR TOOTHPASTE.    The Morning of Surgery  Do not wear jewelry, make-up or nail polish.  Do not wear lotions, powders, or perfumes/colognes, or deodorant  Do not shave 48 hours prior to surgery.  Men may shave face and neck.  Do not bring valuables to the hospital.  [facility] is not responsible for any belongings or valuables.  If you are a smoker, DO NOT Smoke 24 hours prior to surgery IF you wear a CPAP at night please bring your mask, tubing, and machine the morning of surgery   Remember that you must have someone to transport you home after your surgery, and remain with you for 24  hours if you are discharged the same day.   Contacts, glasses, hearing aids, dentures or bridgework may not be worn into surgery.    Leave your suitcase in the car.  After surgery it may be brought to your room.  For patients admitted to the hospital, discharge time will be determined by your treatment team.  Patients discharged the day of surgery will not be allowed to drive home.    Special instructions:   Please read over the following fact sheets that you were given.

## 2019-02-13 ENCOUNTER — Encounter (HOSPITAL_COMMUNITY)
Admission: RE | Admit: 2019-02-13 | Discharge: 2019-02-13 | Disposition: A | Discharge status: Home / Self Care | Payer: BC Managed Care – PPO | Source: Ambulatory Visit | Attending: General Surgery | Admitting: General Surgery

## 2019-02-13 ENCOUNTER — Encounter (HOSPITAL_COMMUNITY): Payer: Self-pay

## 2019-02-13 ENCOUNTER — Other Ambulatory Visit: Payer: Self-pay

## 2019-02-13 DIAGNOSIS — Z01812 Encounter for preprocedural laboratory examination: Secondary | ICD-10-CM | POA: Insufficient documentation

## 2019-02-13 HISTORY — DX: Anemia, unspecified: D64.9

## 2019-02-13 LAB — CBC
HCT: 37.3 % (ref 36.0–46.0)
Hemoglobin: 11.8 g/dL — ABNORMAL LOW (ref 12.0–15.0)
MCH: 32.4 pg (ref 26.0–34.0)
MCHC: 31.6 g/dL (ref 30.0–36.0)
MCV: 102.5 fL — ABNORMAL HIGH (ref 80.0–100.0)
Platelets: 239 10*3/uL (ref 150–400)
RBC: 3.64 MIL/uL — ABNORMAL LOW (ref 3.87–5.11)
RDW: 11.8 % (ref 11.5–15.5)
WBC: 6 10*3/uL (ref 4.0–10.5)
nRBC: 0 % (ref 0.0–0.2)

## 2019-02-13 LAB — BASIC METABOLIC PANEL
Anion gap: 9 (ref 5–15)
BUN: 10 mg/dL (ref 6–20)
CO2: 25 mmol/L (ref 22–32)
Calcium: 9.5 mg/dL (ref 8.9–10.3)
Chloride: 108 mmol/L (ref 98–111)
Creatinine, Ser: 0.72 mg/dL (ref 0.44–1.00)
GFR calc Af Amer: >60 mL/min (ref >60)
GFR calc non Af Amer: >60 mL/min (ref >60)
Glucose, Bld: 106 mg/dL — ABNORMAL HIGH (ref 70–99)
Potassium: 3.7 mmol/L (ref 3.5–5.1)
Sodium: 142 mmol/L (ref 135–145)

## 2019-02-13 NOTE — Progress Notes (Signed)
PCP - Wilfred Lacy, NP  Cardiologist - no  Chest x-ray - no  EKG - 12/18/2018  Stress Test - no  ECHO - 12/2018  Cardiac Cath - no  Sleep Study -no  CPAP - no  LABS-CBC, BMP  ASA-no  ERAS- yes, Pre- SUrgery Drink  HA1C-na Fasting Blood Sugar - na Checks Blood Sugar __na___ times a day  Anesthesia-  Pt denies having chest pain, sob, or fever at this time. All instructions explained to the pt, with a verbal understanding of the material. Pt agrees to go over the instructions while at home for a better understanding. Pt also instructed to self quarantine after being tested for COVID-19. The opportunity to ask questions was provided.

## 2019-02-16 ENCOUNTER — Other Ambulatory Visit (HOSPITAL_COMMUNITY)
Admission: RE | Admit: 2019-02-16 | Discharge: 2019-02-16 | Disposition: A | Discharge status: Home / Self Care | Payer: BC Managed Care – PPO | Source: Ambulatory Visit | Attending: General Surgery | Admitting: General Surgery

## 2019-02-16 DIAGNOSIS — Z1159 Encounter for screening for other viral diseases: Secondary | ICD-10-CM | POA: Diagnosis present

## 2019-02-17 LAB — SARS CORONAVIRUS 2 (TAT 6-24 HRS): SARS Coronavirus 2: NEGATIVE

## 2019-02-20 ENCOUNTER — Encounter (HOSPITAL_COMMUNITY): Payer: Self-pay

## 2019-02-20 ENCOUNTER — Ambulatory Visit (HOSPITAL_COMMUNITY)
Admission: RE | Admit: 2019-02-20 | Discharge: 2019-02-20 | Disposition: A | Discharge status: Home / Self Care | Payer: BC Managed Care – PPO | Attending: General Surgery | Admitting: General Surgery

## 2019-02-20 ENCOUNTER — Encounter (HOSPITAL_COMMUNITY)
Admission: RE | Disposition: A | Discharge status: Home / Self Care | Payer: Self-pay | Source: Home / Self Care | Attending: General Surgery

## 2019-02-20 ENCOUNTER — Ambulatory Visit (HOSPITAL_COMMUNITY): Payer: BC Managed Care – PPO | Admitting: Certified Registered"

## 2019-02-20 ENCOUNTER — Other Ambulatory Visit: Payer: Self-pay

## 2019-02-20 DIAGNOSIS — Z170 Estrogen receptor positive status [ER+]: Secondary | ICD-10-CM | POA: Diagnosis not present

## 2019-02-20 DIAGNOSIS — Z853 Personal history of malignant neoplasm of breast: Secondary | ICD-10-CM | POA: Diagnosis not present

## 2019-02-20 DIAGNOSIS — C50412 Malignant neoplasm of upper-outer quadrant of left female breast: Secondary | ICD-10-CM | POA: Diagnosis not present

## 2019-02-20 DIAGNOSIS — I10 Essential (primary) hypertension: Secondary | ICD-10-CM | POA: Insufficient documentation

## 2019-02-20 DIAGNOSIS — Z9012 Acquired absence of left breast and nipple: Secondary | ICD-10-CM | POA: Diagnosis not present

## 2019-02-20 DIAGNOSIS — Z79899 Other long term (current) drug therapy: Secondary | ICD-10-CM | POA: Diagnosis not present

## 2019-02-20 DIAGNOSIS — Z9221 Personal history of antineoplastic chemotherapy: Secondary | ICD-10-CM | POA: Insufficient documentation

## 2019-02-20 HISTORY — PX: BREAST LUMPECTOMY WITH RADIOACTIVE SEED LOCALIZATION: SHX6424

## 2019-02-20 SURGERY — BREAST LUMPECTOMY WITH RADIOACTIVE SEED LOCALIZATION
Anesthesia: General | Site: Breast | Laterality: Left

## 2019-02-20 MED ORDER — LACTATED RINGERS IV SOLN
INTRAVENOUS | Status: DC | PRN
  Administered 2019-02-20: 13:00:00 via INTRAVENOUS

## 2019-02-20 MED ORDER — ACETAMINOPHEN 325 MG PO TABS: 650.0000 mg | ORAL_TABLET | ORAL | Status: DC | PRN

## 2019-02-20 MED ORDER — DEXMEDETOMIDINE HCL IN NACL 200 MCG/50ML IV SOLN
INTRAVENOUS | Status: AC
  Filled 2019-02-20: qty 50

## 2019-02-20 MED ORDER — DEXAMETHASONE SODIUM PHOSPHATE 10 MG/ML IJ SOLN
INTRAMUSCULAR | Status: DC | PRN
  Administered 2019-02-20: 10 mg via INTRAVENOUS

## 2019-02-20 MED ORDER — OXYCODONE HCL 5 MG PO TABS: 5.0000 mg | ORAL_TABLET | ORAL | Status: DC | PRN

## 2019-02-20 MED ORDER — OXYCODONE HCL 5 MG PO TABS: 5.0000 mg | ORAL_TABLET | Freq: Four times a day (QID) | ORAL | 0 refills | Status: DC | PRN

## 2019-02-20 MED ORDER — EPHEDRINE 5 MG/ML INJ
INTRAVENOUS | Status: AC
  Filled 2019-02-20: qty 30

## 2019-02-20 MED ORDER — ACETAMINOPHEN 650 MG RE SUPP: 650.0000 mg | RECTAL | Status: DC | PRN

## 2019-02-20 MED ORDER — ENSURE PRE-SURGERY PO LIQD
296.0000 mL | Freq: Once | ORAL | Status: DC
  Filled 2019-02-20: qty 296

## 2019-02-20 MED ORDER — 0.9 % SODIUM CHLORIDE (POUR BTL) OPTIME
TOPICAL | Status: DC | PRN
  Administered 2019-02-20: 1000 mL

## 2019-02-20 MED ORDER — CEFAZOLIN SODIUM-DEXTROSE 2-4 GM/100ML-% IV SOLN
2.0000 g | INTRAVENOUS | Status: AC
  Administered 2019-02-20: 2 g via INTRAVENOUS

## 2019-02-20 MED ORDER — GABAPENTIN 300 MG PO CAPS
ORAL_CAPSULE | ORAL | Status: AC
  Administered 2019-02-20: 100 mg via ORAL
  Filled 2019-02-20: qty 1

## 2019-02-20 MED ORDER — PHENYLEPHRINE 40 MCG/ML (10ML) SYRINGE FOR IV PUSH (FOR BLOOD PRESSURE SUPPORT)
PREFILLED_SYRINGE | INTRAVENOUS | Status: AC
  Filled 2019-02-20: qty 10

## 2019-02-20 MED ORDER — MIDAZOLAM HCL 5 MG/5ML IJ SOLN
INTRAMUSCULAR | Status: DC | PRN
  Administered 2019-02-20 (×2): 1 mg via INTRAVENOUS

## 2019-02-20 MED ORDER — ACETAMINOPHEN 500 MG PO TABS
1000.0000 mg | ORAL_TABLET | ORAL | Status: AC
  Administered 2019-02-20: 13:00:00 1000 mg via ORAL

## 2019-02-20 MED ORDER — GABAPENTIN 100 MG PO CAPS
100.0000 mg | ORAL_CAPSULE | ORAL | Status: AC
  Administered 2019-02-20: 100 mg via ORAL

## 2019-02-20 MED ORDER — FENTANYL CITRATE (PF) 250 MCG/5ML IJ SOLN
INTRAMUSCULAR | Status: DC | PRN
  Administered 2019-02-20 (×4): 25 ug via INTRAVENOUS

## 2019-02-20 MED ORDER — LIDOCAINE 2% (20 MG/ML) 5 ML SYRINGE
INTRAMUSCULAR | Status: DC | PRN
  Administered 2019-02-20: 60 mg via INTRAVENOUS

## 2019-02-20 MED ORDER — SUCCINYLCHOLINE CHLORIDE 200 MG/10ML IV SOSY
PREFILLED_SYRINGE | INTRAVENOUS | Status: AC
  Filled 2019-02-20: qty 10

## 2019-02-20 MED ORDER — PROPOFOL 10 MG/ML IV BOLUS
INTRAVENOUS | Status: DC | PRN
  Administered 2019-02-20: 150 mg via INTRAVENOUS

## 2019-02-20 MED ORDER — LIDOCAINE 2% (20 MG/ML) 5 ML SYRINGE
INTRAMUSCULAR | Status: AC
  Filled 2019-02-20: qty 5

## 2019-02-20 MED ORDER — MIDAZOLAM HCL 2 MG/2ML IJ SOLN
INTRAMUSCULAR | Status: AC
  Filled 2019-02-20: qty 2

## 2019-02-20 MED ORDER — ONDANSETRON HCL 4 MG/2ML IJ SOLN
INTRAMUSCULAR | Status: DC | PRN
  Administered 2019-02-20: 4 mg via INTRAVENOUS

## 2019-02-20 MED ORDER — STERILE WATER FOR IRRIGATION IR SOLN
Status: DC | PRN
  Administered 2019-02-20: 1000 mL

## 2019-02-20 MED ORDER — GLYCOPYRROLATE PF 0.2 MG/ML IJ SOSY
PREFILLED_SYRINGE | INTRAMUSCULAR | Status: AC
  Filled 2019-02-20: qty 1

## 2019-02-20 MED ORDER — CEFAZOLIN SODIUM-DEXTROSE 2-4 GM/100ML-% IV SOLN
INTRAVENOUS | Status: AC
  Filled 2019-02-20: qty 100

## 2019-02-20 MED ORDER — MORPHINE SULFATE (PF) 2 MG/ML IV SOLN: 1.0000 mg | INTRAVENOUS | Status: DC | PRN

## 2019-02-20 MED ORDER — FENTANYL CITRATE (PF) 100 MCG/2ML IJ SOLN: 25.0000 ug | INTRAMUSCULAR | Status: DC | PRN

## 2019-02-20 MED ORDER — ONDANSETRON HCL 4 MG/2ML IJ SOLN
INTRAMUSCULAR | Status: AC
  Filled 2019-02-20: qty 2

## 2019-02-20 MED ORDER — PROMETHAZINE HCL 25 MG/ML IJ SOLN: 6.2500 mg | INTRAMUSCULAR | Status: DC | PRN

## 2019-02-20 MED ORDER — SCOPOLAMINE 1 MG/3DAYS TD PT72
1.0000 | MEDICATED_PATCH | TRANSDERMAL | Status: DC
  Administered 2019-02-20: 1.5 mg via TRANSDERMAL

## 2019-02-20 MED ORDER — PROPOFOL 10 MG/ML IV BOLUS
INTRAVENOUS | Status: AC
  Filled 2019-02-20: qty 20

## 2019-02-20 MED ORDER — SODIUM CHLORIDE 0.9% FLUSH: 3.0000 mL | INTRAVENOUS | Status: DC | PRN

## 2019-02-20 MED ORDER — ACETAMINOPHEN 500 MG PO TABS
ORAL_TABLET | ORAL | Status: AC
  Administered 2019-02-20: 1000 mg via ORAL
  Filled 2019-02-20: qty 2

## 2019-02-20 MED ORDER — SODIUM CHLORIDE 0.9 % IV SOLN: INTRAVENOUS | Status: DC

## 2019-02-20 MED ORDER — SODIUM CHLORIDE 0.9 % IV SOLN: 250.0000 mL | INTRAVENOUS | Status: DC | PRN

## 2019-02-20 MED ORDER — BUPIVACAINE-EPINEPHRINE 0.25% -1:200000 IJ SOLN
INTRAMUSCULAR | Status: DC | PRN
  Administered 2019-02-20: 8 mL

## 2019-02-20 MED ORDER — FENTANYL CITRATE (PF) 250 MCG/5ML IJ SOLN
INTRAMUSCULAR | Status: AC
  Filled 2019-02-20: qty 5

## 2019-02-20 MED ORDER — ACETAMINOPHEN 500 MG PO TABS: 1000.0000 mg | ORAL_TABLET | Freq: Once | ORAL | Status: DC

## 2019-02-20 MED ORDER — GABAPENTIN 100 MG PO CAPS
ORAL_CAPSULE | ORAL | Status: AC
  Filled 2019-02-20: qty 1

## 2019-02-20 MED ORDER — FENTANYL CITRATE (PF) 100 MCG/2ML IJ SOLN
INTRAMUSCULAR | Status: AC
  Filled 2019-02-20: qty 2

## 2019-02-20 MED ORDER — SODIUM CHLORIDE 0.9 % IV SOLN
INTRAVENOUS | Status: DC | PRN
  Administered 2019-02-20: 30 ug/min via INTRAVENOUS

## 2019-02-20 MED ORDER — SCOPOLAMINE 1 MG/3DAYS TD PT72
MEDICATED_PATCH | TRANSDERMAL | Status: AC
  Administered 2019-02-20: 13:00:00 1.5 mg via TRANSDERMAL
  Filled 2019-02-20: qty 1

## 2019-02-20 MED ORDER — DEXAMETHASONE SODIUM PHOSPHATE 10 MG/ML IJ SOLN
INTRAMUSCULAR | Status: AC
  Filled 2019-02-20: qty 1

## 2019-02-20 MED ORDER — BUPIVACAINE-EPINEPHRINE (PF) 0.25% -1:200000 IJ SOLN
INTRAMUSCULAR | Status: AC
  Filled 2019-02-20: qty 30

## 2019-02-20 SURGICAL SUPPLY — 44 items
ADH SKN CLS APL DERMABOND .7 (GAUZE/BANDAGES/DRESSINGS) ×1
APL PRP STRL LF DISP 70% ISPRP (MISCELLANEOUS) ×1
APPLIER CLIP 9.375 MED OPEN (MISCELLANEOUS)
APR CLP MED 9.3 20 MLT OPN (MISCELLANEOUS)
CANISTER SUCT 3000ML PPV (MISCELLANEOUS) ×3 IMPLANT
CHLORAPREP W/TINT 26 (MISCELLANEOUS) ×3 IMPLANT
CLIP APPLIE 9.375 MED OPEN (MISCELLANEOUS) IMPLANT
CLIP VESOCCLUDE MED 6/CT (CLIP) ×3 IMPLANT
CLOSURE WOUND 1/2 X4 (GAUZE/BANDAGES/DRESSINGS) ×1
COVER PROBE W GEL 5X96 (DRAPES) ×3 IMPLANT
COVER SURGICAL LIGHT HANDLE (MISCELLANEOUS) ×3 IMPLANT
COVER WAND RF STERILE (DRAPES) ×3 IMPLANT
DERMABOND ADVANCED (GAUZE/BANDAGES/DRESSINGS) ×2
DERMABOND ADVANCED .7 DNX12 (GAUZE/BANDAGES/DRESSINGS) ×1 IMPLANT
DEVICE DUBIN SPECIMEN MAMMOGRA (MISCELLANEOUS) ×3 IMPLANT
DRAPE CHEST BREAST 15X10 FENES (DRAPES) ×3 IMPLANT
ELECT COATED BLADE 2.86 ST (ELECTRODE) ×3 IMPLANT
ELECT REM PT RETURN 9FT ADLT (ELECTROSURGICAL) ×3
ELECTRODE REM PT RTRN 9FT ADLT (ELECTROSURGICAL) ×1 IMPLANT
GLOVE BIO SURGEON STRL SZ7 (GLOVE) ×6 IMPLANT
GLOVE BIOGEL PI IND STRL 7.5 (GLOVE) ×1 IMPLANT
GLOVE BIOGEL PI INDICATOR 7.5 (GLOVE) ×2
GOWN STRL REUS W/ TWL LRG LVL3 (GOWN DISPOSABLE) ×2 IMPLANT
GOWN STRL REUS W/TWL LRG LVL3 (GOWN DISPOSABLE) ×6
KIT BASIN OR (CUSTOM PROCEDURE TRAY) ×3 IMPLANT
KIT MARKER MARGIN INK (KITS) ×3 IMPLANT
LIGHT WAVEGUIDE WIDE FLAT (MISCELLANEOUS) IMPLANT
NDL HYPO 25GX1X1/2 BEV (NEEDLE) ×1 IMPLANT
NEEDLE HYPO 25GX1X1/2 BEV (NEEDLE) ×3 IMPLANT
NS IRRIG 1000ML POUR BTL (IV SOLUTION) ×3 IMPLANT
PACK GENERAL/GYN (CUSTOM PROCEDURE TRAY) ×3 IMPLANT
STRIP CLOSURE SKIN 1/2X4 (GAUZE/BANDAGES/DRESSINGS) ×2 IMPLANT
SUT MNCRL AB 4-0 PS2 18 (SUTURE) ×3 IMPLANT
SUT MON AB 5-0 PS2 18 (SUTURE) IMPLANT
SUT SILK 2 0 SH (SUTURE) IMPLANT
SUT SILK 3 0 SH 30 (SUTURE) ×2 IMPLANT
SUT VIC AB 2-0 SH 27 (SUTURE) ×6
SUT VIC AB 2-0 SH 27X BRD (SUTURE) IMPLANT
SUT VIC AB 2-0 SH 27XBRD (SUTURE) ×1 IMPLANT
SUT VIC AB 3-0 SH 27 (SUTURE) ×3
SUT VIC AB 3-0 SH 27X BRD (SUTURE) ×1 IMPLANT
SYR CONTROL 10ML LL (SYRINGE) ×3 IMPLANT
TOWEL GREEN STERILE (TOWEL DISPOSABLE) ×3 IMPLANT
TOWEL GREEN STERILE FF (TOWEL DISPOSABLE) ×3 IMPLANT

## 2019-02-20 NOTE — Op Note (Signed)
Preoperative diagnosis: Recurrent left breast cancer status post left mastectomy, sentinel node biopsy, TRAM flap Postoperative diagnosis: Same as above Procedure: Left breast seed guided lumpectomy Surgeon: Dr. Serita Grammes Anesthesia: General Estimated blood loss: Minimal Complications: None Drains: None Specimens: 1.  Left breast tissue marked with paint containing radioactive seed and clip 2.  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  She underwent antiestrogen therapy following that.  She recently noted a left breast mass in the high upper outer quadrant.  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  Initial MRI showed this to be a 2.3 cm mass.  She underwent primary chemo with anti-HER-2 therapy.  MRI shows this to be 2 cm we discussed re-excising this with a lumpectomy.  Procedure: After informed consent was obtained the patient was taken to the operating room.  She had a seed placed and I had these mammograms available for my review.  She then underwent general anesthesia without complication.  SCDs were in place.  Antibiotics were given.  She was prepped and draped in the standard sterile surgical fashion.  A surgical timeout was then performed.  Identified the seed in the high upper outer quadrant.  It was close to the skin as expected.  I made an elliptical incision after filtrated Marcaine.  I then remove the mass with a rim of normal tissue around it and attempt to get a clear margin.  I did remove some of the skin  overlying it as well.  The specimen was painted.  Mammogram confirmed removal of the clip and the seed.  I did take 3 additional margins as above.  There is no more tissue to take posteriorly.  I placed clips in the cavity.  I closed the cavity with 2-0 Vicryl, 3-0 Vicryl.  The skin was closed with 4-0 Monocryl and glue.  She tolerated as well as extubated and transferred recovery stable.

## 2019-02-20 NOTE — Transfer of Care (Signed)
Immediate Anesthesia Transfer of Care Note  Patient: Paige Foster  Procedure(s) Performed: LEFT BREAST LUMPECTOMY WITH RADIOACTIVE SEED LOCALIZATION (Left Breast)  Patient Location: PACU  Anesthesia Type:General  Level of Consciousness: awake, alert , oriented and patient cooperative  Airway & Oxygen Therapy: Patient Spontanous Breathing and Patient connected to face mask oxygen  Post-op Assessment: Report given to RN and Post -op Vital signs reviewed and stable  Post vital signs: Reviewed and stable  Last Vitals:  Vitals Value Taken Time  BP 126/82 02/20/19 1429  Temp    Pulse 97 02/20/19 1432  Resp 28 02/20/19 1432  SpO2 100 % 02/20/19 1432  Vitals shown include unvalidated device data.  Last Pain:  Vitals:   02/20/19 1251  TempSrc:   PainSc: 0-No pain         Complications: No apparent anesthesia complications

## 2019-02-20 NOTE — Anesthesia Preprocedure Evaluation (Addendum)
Anesthesia Evaluation  Patient identified by MRN, date of birth, ID band Patient awake    Reviewed: Allergy & Precautions, NPO status , Patient's Chart, lab work & pertinent test results  History of Anesthesia Complications Negative for: history of anesthetic complications  Airway Mallampati: II  TM Distance: >3 FB Neck ROM: Full    Dental  (+) Dental Advisory Given   Pulmonary neg pulmonary ROS,    breath sounds clear to auscultation       Cardiovascular hypertension, Pt. on medications  Rhythm:Regular     Neuro/Psych negative neurological ROS  negative psych ROS   GI/Hepatic negative GI ROS, Neg liver ROS,   Endo/Other  negative endocrine ROS  Renal/GU Renal diseaseRenal artery stenosis      Musculoskeletal negative musculoskeletal ROS (+)   Abdominal   Peds  Hematology  (+) anemia ,   Anesthesia Other Findings Recent diagnosis of breast cancer and chemotherapy   Reproductive/Obstetrics                           Anesthesia Physical  Anesthesia Plan  ASA: II  Anesthesia Plan: General   Post-op Pain Management:    Induction: Intravenous  PONV Risk Score and Plan: 3 and Ondansetron, Dexamethasone and Scopolamine patch - Pre-op  Airway Management Planned: LMA  Additional Equipment: None  Intra-op Plan:   Post-operative Plan: Extubation in OR  Informed Consent: I have reviewed the patients History and Physical, chart, labs and discussed the procedure including the risks, benefits and alternatives for the proposed anesthesia with the patient or authorized representative who has indicated his/her understanding and acceptance.     Dental advisory given  Plan Discussed with: CRNA, Anesthesiologist and Surgeon  Anesthesia Plan Comments:        Anesthesia Quick Evaluation

## 2019-02-20 NOTE — Anesthesia Procedure Notes (Signed)
Procedure Name: LMA Insertion Date/Time: 02/20/2019 1:37 PM Performed by: Cleda Daub, CRNA Pre-anesthesia Checklist: Patient identified, Emergency Drugs available, Suction available and Patient being monitored Patient Re-evaluated:Patient Re-evaluated prior to induction Oxygen Delivery Method: Circle system utilized Preoxygenation: Pre-oxygenation with 100% oxygen Induction Type: IV induction LMA: LMA inserted LMA Size: 4.0 Number of attempts: 1 Placement Confirmation: positive ETCO2 Tube secured with: Tape Dental Injury: Teeth and Oropharynx as per pre-operative assessment

## 2019-02-20 NOTE — Discharge Instructions (Signed)
[facility] °Office Phone Number [phone] ° °POST OP INSTRUCTIONS °Take 400 mg of ibuprofen every 8 hours or 650 mg tylenol every 6 hours for next 72 hours then as needed. Use ice several times daily also. °Always review your discharge instruction sheet given to you by the facility where your surgery was performed. ° °IF YOU HAVE DISABILITY OR FAMILY LEAVE FORMS, YOU MUST BRING THEM TO THE OFFICE FOR PROCESSING.  DO NOT GIVE THEM TO YOUR DOCTOR. ° °1. A prescription for pain medication may be given to you upon discharge.  Take your pain medication as prescribed, if needed.  If narcotic pain medicine is not needed, then you may take acetaminophen (Tylenol), naprosyn (Alleve) or ibuprofen (Advil) as needed. °2. Take your usually prescribed medications unless otherwise directed °3. If you need a refill on your pain medication, please contact your pharmacy.  They will contact our office to request authorization.  Prescriptions will not be filled after 5pm or on week-ends. °4. You should eat very light the first 24 hours after surgery, such as soup, crackers, pudding, etc.  Resume your normal diet the day after surgery. °5. Most patients will experience some swelling and bruising in the breast.  Ice packs and a good support bra will help.  Wear the breast binder provided or a sports bra for 72 hours day and night.  After that wear a sports bra during the day until you return to the office. Swelling and bruising can take several days to resolve.  °6. It is common to experience some constipation if taking pain medication after surgery.  Increasing fluid intake and taking a stool softener will usually help or prevent this problem from occurring.  A mild laxative (Milk of Magnesia or Miralax) should be taken according to package directions if there are no bowel movements after 48 hours. °7. Unless discharge instructions indicate otherwise, you may remove your bandages 48 hours after surgery and you may  shower at that time.  You may have steri-strips (small skin tapes) in place directly over the incision.  These strips should be left on the skin for 7-10 days and will come off on their own.  If your surgeon used skin glue on the incision, you may shower in 24 hours.  The glue will flake off over the next 2-3 weeks.  Any sutures or staples will be removed at the office during your follow-up visit. °8. ACTIVITIES:  You may resume regular daily activities (gradually increasing) beginning the next day.  Wearing a good support bra or sports bra minimizes pain and swelling.  You may have sexual intercourse when it is comfortable. °a. You may drive when you no longer are taking prescription pain medication, you can comfortably wear a seatbelt, and you can safely maneuver your car and apply brakes. °b. RETURN TO WORK:  ______________________________________________________________________________________ °9. You should see your doctor in the office for a follow-up appointment approximately two weeks after your surgery.  Your doctor’s nurse will typically make your follow-up appointment when she calls you with your pathology report.  Expect your pathology report 3-4 business days after your surgery.  You may call to check if you do not hear from us after three days. °10. OTHER INSTRUCTIONS: _______________________________________________________________________________________________ _____________________________________________________________________________________________________________________________________ °_____________________________________________________________________________________________________________________________________ °_____________________________________________________________________________________________________________________________________ ° °WHEN TO CALL DR Emileigh Kellett: °1. Fever over 101.0 °2. Nausea and/or vomiting. °3. Extreme swelling or bruising. °4. Continued bleeding from  incision. °5. Increased pain, redness, or drainage from the incision. ° °The clinic staff is available to   answer your questions during regular business hours.  Please don’t hesitate to call and ask to speak to one of the nurses for clinical concerns.  If you have a medical emergency, go to the nearest emergency room or call 911.  A surgeon from [facility] is always on call at the hospital. ° °For further questions, please visit [email or web address] ° °

## 2019-02-20 NOTE — H&P (Signed)
  51 yof I saw in February for recurrent left breast cancer. she was treated in 2003 by Drs Estill Batten and [place] with left mastectomy/sn/tram flap for a 15 cm area of hg dcis that was margin negative with alh and adh in pathology. she had sn biopsy that did show isolated cytokeratin pos cell in node. she had right sided reduction. this was at least er pos. she then underwent antiestrogen therapy she thinks five years. she does not recall genetics. she has done well until recently she noted a left breast mass in high uoq. she was evaluated in June of 2019 and this was thought to be fat necrosis. she then was recommended three month follow up but wasnt able to do this until recently. she underwent US that shows a 1.8x1.7x1.4 cm mass. there are no visible nodes in her axilla by [place]. 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 she had initial mri that showed a 2.3 cm mass in uo left breast. no abnl nodes, right negative. she elected to undergo primary chemotherapy with antiher2 therapy. she feels this has gotten smaller. a repeat mri shows this at 2 cm. she is here to discuss surgery   Allergies (Tanisha A. Owens Shark, [place]; 01/29/2019 11:33 AM) No Known Drug Allergies  [09/06/2018]: Allergies Reconciled   Medication History (Tanisha A. Owens Shark, [place]; 01/29/2019 11:33 AM) amLODIPine-Atorvastatin (10-10MG  Tablet, Oral) Active. Losartan Potassium (50MG  Tablet, Oral) Active. LORazepam (0.5MG  Tablet, Oral) Active. Brimonidine Tartrate (0.2% Solution, Ophthalmic) Active. Medications Reconciled  ROS negative  Vitals (Tanisha A. Brown RMA; 01/29/2019 11:32 AM) 01/29/2019 11:32 AM Weight: 139.2 lb Height: 64in Body Surface Area: 1.68 m Body Mass Index: 23.89 kg/m  Temp.: 98.86F  Pulse: 106 (Regular)  BP: 136/84(Sitting, Left Arm, Standard) Physical Exam Rolm Bookbinder MD; 01/29/2019 12:48 PM) General Mental  Status-Alert. Orientation-Oriented X3. Head and Neck Trachea-midline. Eye Sclera/Conjunctiva - Bilateral-No scleral icterus. Chest and Lung Exam Chest and lung exam reveals -quiet, even and easy respiratory effort with no use of accessory muscles. Breast Nipples Discharge - Right - None - Right. Lymphatic Head & Neck General Head & Neck Lymphatics: Bilateral - Description - Normal. Axillary General Axillary Region: Bilateral - Description - Normal.   Assessment & Plan Rolm Bookbinder MD; 01/29/2019 12:51 PM)  RECURRENT BREAST CANCER, LEFT (C50.912) Story: Left breast seed lumpectomy she has a very modest response to chemotherapy. discussed again today how recurrences occur after mastectomy. I think lumpectomy of this area is choice. we discussed nodal surgery today. she already has sn. I think validity of sn biopsy even if inject uoq is certainly in question. nodes are clinically and radiologically negative and she will receive radiotherapy postop already. we both agreed to omit this due to higher chance of lymphedema and wont really change therapy at this point. we discussed risk, recovery after surgery

## 2019-02-20 NOTE — Interval H&P Note (Signed)
History and Physical Interval Note:  02/20/2019 1:03 PM  Paige Foster  has presented today for surgery, with the diagnosis of RECURRENT LEFT BREAST CANCER.  The various methods of treatment have been discussed with the patient and family. After consideration of risks, benefits and other options for treatment, the patient has consented to  Procedure(s): LEFT BREAST LUMPECTOMY WITH RADIOACTIVE SEED LOCALIZATION (Left) as a surgical intervention.  The patient's history has been reviewed, patient examined, no change in status, stable for surgery.  I have reviewed the patient's chart and labs.  Questions were answered to the patient's satisfaction.     Rolm Bookbinder

## 2019-02-21 ENCOUNTER — Encounter (HOSPITAL_COMMUNITY): Payer: Self-pay | Admitting: General Surgery

## 2019-02-21 ENCOUNTER — Telehealth: Payer: Self-pay | Admitting: Adult Health

## 2019-02-21 NOTE — Telephone Encounter (Signed)
Called pt per 7/29 sch message - no answer . Left message for patient to call back to reschedule appt

## 2019-02-21 NOTE — Anesthesia Postprocedure Evaluation (Signed)
Anesthesia Post Note  Patient: CHANEL MCADAMS  Procedure(s) Performed: LEFT BREAST LUMPECTOMY WITH RADIOACTIVE SEED LOCALIZATION (Left Breast)     Patient location during evaluation: PACU Anesthesia Type: General Level of consciousness: awake and alert Pain management: pain level controlled Vital Signs Assessment: post-procedure vital signs reviewed and stable Respiratory status: spontaneous breathing, nonlabored ventilation, respiratory function stable and patient connected to nasal cannula oxygen Cardiovascular status: blood pressure returned to baseline and stable Postop Assessment: no apparent nausea or vomiting Anesthetic complications: no    Last Vitals:  Vitals:   02/20/19 1430 02/20/19 1445  BP: 126/82 123/79  Pulse: (!) 101 82  Resp: 14 (!) 21  Temp: (!) 36.2 C 36.7 C  SpO2: 100% 99%    Last Pain:  Vitals:   02/20/19 1430  TempSrc:   PainSc: 0-No pain                 Glorianne Proctor

## 2019-02-22 ENCOUNTER — Encounter: Payer: Self-pay | Admitting: Oncology

## 2019-02-22 ENCOUNTER — Ambulatory Visit: Payer: BC Managed Care – PPO

## 2019-02-22 ENCOUNTER — Other Ambulatory Visit: Payer: BC Managed Care – PPO

## 2019-02-28 ENCOUNTER — Other Ambulatory Visit: Payer: Self-pay | Admitting: *Deleted

## 2019-02-28 DIAGNOSIS — C50412 Malignant neoplasm of upper-outer quadrant of left female breast: Secondary | ICD-10-CM

## 2019-02-28 DIAGNOSIS — Z170 Estrogen receptor positive status [ER+]: Secondary | ICD-10-CM

## 2019-03-01 ENCOUNTER — Ambulatory Visit: Payer: BC Managed Care – PPO | Admitting: Adult Health

## 2019-03-01 ENCOUNTER — Telehealth: Payer: Self-pay | Admitting: Radiation Oncology

## 2019-03-01 ENCOUNTER — Other Ambulatory Visit: Payer: BC Managed Care – PPO

## 2019-03-01 ENCOUNTER — Ambulatory Visit: Payer: BC Managed Care – PPO

## 2019-03-01 NOTE — Telephone Encounter (Signed)
New Message: ° ° °LVM for patient to return call to schedule appt from referral received. °

## 2019-03-02 ENCOUNTER — Inpatient Hospital Stay (HOSPITAL_BASED_OUTPATIENT_CLINIC_OR_DEPARTMENT_OTHER): Payer: BC Managed Care – PPO | Admitting: Adult Health

## 2019-03-02 ENCOUNTER — Encounter: Payer: Self-pay | Admitting: Adult Health

## 2019-03-02 ENCOUNTER — Encounter: Payer: Self-pay | Admitting: Oncology

## 2019-03-02 ENCOUNTER — Inpatient Hospital Stay: Payer: BC Managed Care – PPO

## 2019-03-02 ENCOUNTER — Other Ambulatory Visit: Payer: Self-pay

## 2019-03-02 ENCOUNTER — Inpatient Hospital Stay: Payer: BC Managed Care – PPO | Attending: Adult Health

## 2019-03-02 VITALS — BP 131/83 | HR 88

## 2019-03-02 VITALS — BP 139/86 | HR 102 | Temp 98.5°F | Resp 16 | Ht 64.0 in | Wt 134.3 lb

## 2019-03-02 DIAGNOSIS — Z170 Estrogen receptor positive status [ER+]: Secondary | ICD-10-CM

## 2019-03-02 DIAGNOSIS — Z79899 Other long term (current) drug therapy: Secondary | ICD-10-CM | POA: Insufficient documentation

## 2019-03-02 DIAGNOSIS — E559 Vitamin D deficiency, unspecified: Secondary | ICD-10-CM

## 2019-03-02 DIAGNOSIS — Z9012 Acquired absence of left breast and nipple: Secondary | ICD-10-CM | POA: Diagnosis not present

## 2019-03-02 DIAGNOSIS — Z5112 Encounter for antineoplastic immunotherapy: Secondary | ICD-10-CM | POA: Diagnosis not present

## 2019-03-02 DIAGNOSIS — C50912 Malignant neoplasm of unspecified site of left female breast: Secondary | ICD-10-CM

## 2019-03-02 DIAGNOSIS — C50412 Malignant neoplasm of upper-outer quadrant of left female breast: Secondary | ICD-10-CM | POA: Diagnosis not present

## 2019-03-02 DIAGNOSIS — Z95828 Presence of other vascular implants and grafts: Secondary | ICD-10-CM

## 2019-03-02 LAB — CBC WITH DIFFERENTIAL/PLATELET
Abs Immature Granulocytes: 0.01 10*3/uL (ref 0.00–0.07)
Basophils Absolute: 0 10*3/uL (ref 0.0–0.1)
Basophils Relative: 0 %
Eosinophils Absolute: 0 10*3/uL (ref 0.0–0.5)
Eosinophils Relative: 0 %
HCT: 36.8 % (ref 36.0–46.0)
Hemoglobin: 12.2 g/dL (ref 12.0–15.0)
Immature Granulocytes: 0 %
Lymphocytes Relative: 25 %
Lymphs Abs: 1.4 10*3/uL (ref 0.7–4.0)
MCH: 32.4 pg (ref 26.0–34.0)
MCHC: 33.2 g/dL (ref 30.0–36.0)
MCV: 97.9 fL (ref 80.0–100.0)
Monocytes Absolute: 0.5 10*3/uL (ref 0.1–1.0)
Monocytes Relative: 9 %
Neutro Abs: 3.7 10*3/uL (ref 1.7–7.7)
Neutrophils Relative %: 66 %
Platelets: 277 10*3/uL (ref 150–400)
RBC: 3.76 MIL/uL — ABNORMAL LOW (ref 3.87–5.11)
RDW: 11.7 % (ref 11.5–15.5)
WBC: 5.7 10*3/uL (ref 4.0–10.5)
nRBC: 0 % (ref 0.0–0.2)

## 2019-03-02 LAB — COMPREHENSIVE METABOLIC PANEL
ALT: 9 U/L (ref 0–44)
AST: 14 U/L — ABNORMAL LOW (ref 15–41)
Albumin: 4.1 g/dL (ref 3.5–5.0)
Alkaline Phosphatase: 94 U/L (ref 38–126)
Anion gap: 14 (ref 5–15)
BUN: 9 mg/dL (ref 6–20)
CO2: 23 mmol/L (ref 22–32)
Calcium: 9.9 mg/dL (ref 8.9–10.3)
Chloride: 105 mmol/L (ref 98–111)
Creatinine, Ser: 0.68 mg/dL (ref 0.44–1.00)
GFR calc Af Amer: >60 mL/min (ref >60)
GFR calc non Af Amer: >60 mL/min (ref >60)
Glucose, Bld: 111 mg/dL — ABNORMAL HIGH (ref 70–99)
Potassium: 3.4 mmol/L — ABNORMAL LOW (ref 3.5–5.1)
Sodium: 142 mmol/L (ref 135–145)
Total Bilirubin: <0.2 mg/dL — ABNORMAL LOW (ref 0.3–1.2)
Total Protein: 7.4 g/dL (ref 6.5–8.1)

## 2019-03-02 MED ORDER — SODIUM CHLORIDE 0.9 % IV SOLN
Freq: Once | INTRAVENOUS | Status: AC
  Administered 2019-03-02: 14:00:00 via INTRAVENOUS
  Filled 2019-03-02: qty 250

## 2019-03-02 MED ORDER — DIPHENHYDRAMINE HCL 25 MG PO CAPS
ORAL_CAPSULE | ORAL | Status: AC
  Filled 2019-03-02: qty 1

## 2019-03-02 MED ORDER — ACETAMINOPHEN 325 MG PO TABS
650.0000 mg | ORAL_TABLET | Freq: Once | ORAL | Status: AC
  Administered 2019-03-02: 650 mg via ORAL

## 2019-03-02 MED ORDER — ACETAMINOPHEN 325 MG PO TABS
ORAL_TABLET | ORAL | Status: AC
  Filled 2019-03-02: qty 2

## 2019-03-02 MED ORDER — DIPHENHYDRAMINE HCL 25 MG PO CAPS
25.0000 mg | ORAL_CAPSULE | Freq: Once | ORAL | Status: AC
  Administered 2019-03-02: 25 mg via ORAL

## 2019-03-02 MED ORDER — TRASTUZUMAB CHEMO 150 MG IV SOLR
6.0000 mg/kg | Freq: Once | INTRAVENOUS | Status: AC
  Administered 2019-03-02: 399 mg via INTRAVENOUS
  Filled 2019-03-02: qty 19

## 2019-03-02 MED ORDER — SODIUM CHLORIDE 0.9% FLUSH
10.0000 mL | Freq: Once | INTRAVENOUS | Status: AC
  Administered 2019-03-02: 10 mL
  Filled 2019-03-02: qty 10

## 2019-03-02 MED ORDER — HEPARIN SOD (PORK) LOCK FLUSH 100 UNIT/ML IV SOLN
500.0000 [IU] | Freq: Once | INTRAVENOUS | Status: AC | PRN
  Administered 2019-03-02: 500 [IU]
  Filled 2019-03-02: qty 5

## 2019-03-02 MED ORDER — SODIUM CHLORIDE 0.9 % IV SOLN
420.0000 mg | Freq: Once | INTRAVENOUS | Status: AC
  Administered 2019-03-02: 420 mg via INTRAVENOUS
  Filled 2019-03-02: qty 14

## 2019-03-02 MED ORDER — SODIUM CHLORIDE 0.9% FLUSH
10.0000 mL | INTRAVENOUS | Status: DC | PRN
  Administered 2019-03-02: 10 mL
  Filled 2019-03-02: qty 10

## 2019-03-02 NOTE — Patient Instructions (Signed)
[facility] Discharge Instructions for Patients Receiving Chemotherapy  Today you received the following Immunotherapy agents: Herceptin, Perjeta  To help prevent nausea and vomiting after your treatment, we encourage you to take your nausea medication as directed by your MD.   If you develop nausea and vomiting that is not controlled by your nausea medication, call the clinic.   BELOW ARE SYMPTOMS THAT SHOULD BE REPORTED IMMEDIATELY:  *FEVER GREATER THAN 100.5 F  *CHILLS WITH OR WITHOUT FEVER  NAUSEA AND VOMITING THAT IS NOT CONTROLLED WITH YOUR NAUSEA MEDICATION  *UNUSUAL SHORTNESS OF BREATH  *UNUSUAL BRUISING OR BLEEDING  TENDERNESS IN MOUTH AND THROAT WITH OR WITHOUT PRESENCE OF ULCERS  *URINARY PROBLEMS  *BOWEL PROBLEMS  UNUSUAL RASH Items with * indicate a potential emergency and should be followed up as soon as possible.  Feel free to call the clinic should you have any questions or concerns. The clinic phone number is [phone].  Please show the [place] at check-in to the Emergency Department and triage nurse.  Coronavirus (COVID-19) Are you at risk?  Are you at risk for the Coronavirus (COVID-19)?  To be considered HIGH RISK for Coronavirus (COVID-19), you have to meet the following criteria:  . Traveled to [place], [place], [place], [place] or [place]; or in the [place] to [place], [place], [place], or [place]; and have fever, cough, and shortness of breath within the last 2 weeks of travel OR . Been in close contact with a person diagnosed with COVID-19 within the last 2 weeks and have fever, cough, and shortness of breath . IF YOU DO NOT MEET THESE CRITERIA, YOU ARE CONSIDERED LOW RISK FOR COVID-19.  What to do if you are HIGH RISK for COVID-19?  Marland Kitchen If you are having a medical emergency, call 911. . Seek medical care right away. Before you go to a doctor's office, urgent care or emergency department, call ahead and tell  them about your recent travel, contact with someone diagnosed with COVID-19, and your symptoms. You should receive instructions from your physician's office regarding next steps of care.  . When you arrive at healthcare provider, tell the healthcare staff immediately you have returned from visiting [place], [place], [place], [place] or [place]; or traveled in the [place] to [place], [place], [place], or [place]; in the last two weeks or you have been in close contact with a person diagnosed with COVID-19 in the last 2 weeks.   . Tell the health care staff about your symptoms: fever, cough and shortness of breath. . After you have been seen by a medical provider, you will be either: o Tested for (COVID-19) and discharged home on quarantine except to seek medical care if symptoms worsen, and asked to  - Stay home and avoid contact with others until you get your results (4-5 days)  - Avoid travel on public transportation if possible (such as bus, train, or airplane) or o Sent to the Emergency Department by EMS for evaluation, COVID-19 testing, and possible admission depending on your condition and test results.  What to do if you are LOW RISK for COVID-19?  Reduce your risk of any infection by using the same precautions used for avoiding the common cold or flu:  Marland Kitchen Wash your hands often with soap and warm water for at least 20 seconds.  If soap and water are not readily available, use an alcohol-based hand sanitizer with at least 60% alcohol.  Marland Kitchen  If coughing or sneezing, cover your mouth and nose by coughing or sneezing into the elbow areas of your shirt or coat, into a tissue or into your sleeve (not your hands). . Avoid shaking hands with others and consider head nods or verbal greetings only. . Avoid touching your eyes, nose, or mouth with unwashed hands.  . Avoid close contact with people who are sick. . Avoid places or events with large numbers of people in one location, like concerts or  sporting events. . Carefully consider travel plans you have or are making. . If you are planning any travel outside or inside the [place], visit the CDC's Travelers' Health webpage for the latest health notices. . If you have some symptoms but not all symptoms, continue to monitor at home and seek medical attention if your symptoms worsen. . If you are having a medical emergency, call 911.   [place] / e-Visit: [email or web address]         [facility]: [facility]: [phone]                   [facility]: [phone]

## 2019-03-02 NOTE — Progress Notes (Signed)
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 At that time she underwent a left mastectomy for stage 0 (noninvasive) breast cancer, with transverse rectus abdominis (TRAM) flap construction under Dr. Towanda Malkin. She also underwent a right breast reduction. She took tamoxifen for three years.  More recently she underwent bilateral diagnostic mammography with tomography and left breast ultrasonography at [facility] on 01/03/2018 showing: Breast Density Category B. There is an oval fat containing lesion in the left breast upper outer quadrant posterior depth. No other significant masses, calcifications, or other findings are seen in either breast. Sonographically, there is a 1.5 cm lesion in the left breast upper outer quadrant posterior depth. This lesion is of mixed echogenicity. This correlates as palpated and with mammography findings. Follow up was recommended.  Close follow-up was suggested.  She then presented with a non-tender mass in the left reconstructed breast on 08/30/2018. On physical exam, there is a hard palpable lump measuring 2.0 cm in the upper outer left reconstructed breast 10 cm from the expected location of a nipple. Sonography over this  area demonstrates a 1.8 cm x 1.7 cm x 1.4 cm mass in the left breast at 2 o'clock posterior depth 10 cm from the nipple. This mass is of mixed echogenicity. This abnormality is increased in size and correlates as palpated and with prior mammography findings. Color flow imaging demonstrates that there is vascularity present. Elastography imaging assessment is intermediate. No significant abnormalities were seen sonographically in the left axilla.    Accordingly on 08/30/2018 she proceeded to biopsy of the left breast mass in question. The pathology from this procedure showed ([id number]): invasive ductal carcinoma, grade III. Prognostic indicators significant for: estrogen receptor, 100% positive with strong staining intensity and progesterone receptor, 0% negative. Proliferation marker Ki67 at 15%. HER2 positive (3+) by immunohistochemistry.  The patient's subsequent history is as detailed below.   INTERVAL HISTORY: Paige Foster returns today for follow up of her breast cancer and evaluation prior to receiving Trastuzumab and Pertuzumab.  She underwent a pars plana lensectomy 12/18/2018 under Dr. Posey Pronto.  He is currently being treated for glaucoma through Dr. Loel Dubonnet at the [facility].  She says her vision is slowly improving and she is following closely with her eye doctor.  She has repeat appointment on 03/12/2019.  Her last echocardiogram was on 01/23/2019 and showed an EF of 60-65%.  She opted to forego her last two cycles of neoadjuvant chemotherapy due to her eye condition.  She underwent post neoadjuvant breast MRI on 01/26/2019 that showed no change in her breast mass.     Since her last visit she underwent left breast lumpectomy on 02/20/2019 that showed invasive ductal carcinoma, grade 2, with negative margins.     REVIEW OF SYSTEMS: Paige Foster is doing well today.  She says that she fell in our lobby as  the nursing tech was getting her back because of her shoes, which are coming apart.   She did not hit her head, and she says that she is not injured.  She wants to know if I can prescribe her high doses of vitamin d due to her h/o osteoporosis.  She said that her surgery went very well.  She had minimal pain.  She notes some constipation from time to time.  She is doing well otherwise.  She denies any fever, chills, chest pain, palpitations.  She has no cough, shortness of breath, nausea, vomiting, or bladder concerns.  A detailed ROS was otherwise non contributory.    Paige Foster says that her employer just noted they never received her FMLA and just told her.  She says that she has been talking with Val about this. She wants an update.  PAST MEDICAL HISTORY: Past Medical History:  Diagnosis Date  . Anemia   . History of blood transfusion 2004  . History of colon polyps   . Hypertension   . Recurrent breast cancer, left [facility]) oncologist-- dr Jana Hakim    dx 2004, noninvasive Stage 0 ----s/p left mastectomy w/ tram flap construction (and right breast reduction), taken Tamoxifen for 3 yrs;   08-30-2018 recurrent left cancer , Grade III,  cT1c,  ER positive, PR negative, HER-2 positive, invasive ductal carcinoma-- neoadjuvant chemo to start 09-26-2018  . Renal artery stenosis (HCC)    mild right external renal artery stenosis per duplex in epic 08-09-2013  . Wears glasses     PAST SURGICAL HISTORY: Past Surgical History:  Procedure Laterality Date  . BREAST LUMPECTOMY WITH RADIOACTIVE SEED LOCALIZATION Left 02/20/2019   Procedure: LEFT BREAST LUMPECTOMY WITH RADIOACTIVE SEED LOCALIZATION;  Surgeon: Rolm Bookbinder, MD;  Location: [facility];  Service: General;  Laterality: Left;  . BREAST SURGERY Left    Transflap  . COLONOSCOPY    . MASTECTOMY Left 2004   w/  TRAM flap construction and right breast augmentation with abdominoplasy  . PARS PLANA VITRECTOMY Left 12/18/2018   Procedure: PARS PLANA VITRECTOMY WITH 25 GAUGE, ENDOLASER;  Surgeon: Jalene Mullet, MD;  Location: [facility];   Service: Ophthalmology;  Laterality: Left;  . PORTACATH PLACEMENT N/A 09/25/2018   Procedure: INSERTION PORT-A-CATH WITH ULTRASOUND;  Surgeon: Rolm Bookbinder, MD;  Location: [facility];  Service: General;  Laterality: N/A;  . TUBAL LIGATION Bilateral yrs ago     FAMILY HISTORY: Family History  Problem Relation Age of Onset  . Diabetes Mother   . Hypertension Mother   . Kidney disease Father   . Colon cancer Neg Hx   . Colon polyps Neg Hx   . Gallbladder disease Neg Hx   . Heart disease Neg Hx   . Esophageal cancer Neg Hx    Paige Foster's father died from unknown causes in his early 81's. Patients' mother died from diabetes complications at age 50. The patient has 1 sister. Patient denies anyone in her family having breast, ovarian, prostate, or pancreatic cancer.    GYNECOLOGIC HISTORY:  Patient's last menstrual period was 06/08/2007. 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 Her husband, Paige Foster, works at [employer]. Pieper has two children, Paige Foster and Paige Foster. Paige Foster lives with her, is 49, and it attending [employer] for a computer based degree. Paige Foster lives  with her, is 29, and recently graduated from [employer] with a degree in [employer]. Shaunice has no grandchildren. She attends the [employer].   ADVANCED DIRECTIVES: Her husband, Paige Foster, is automatically her healthcare power of attorney     HEALTH MAINTENANCE: Social History   Tobacco Use  . Smoking status: Never Smoker  . Smokeless tobacco: Never Used  Substance Use Topics  . Alcohol use: Not Currently    Alcohol/week: 0.0 standard drinks    Comment: Occassionally  . Drug use: No    Colonoscopy: yes  PAP:   Bone density: yes, 2017; -1.6, osteopenic   No Known Allergies  Current Outpatient Medications  Medication Sig Dispense  Refill  . amLODipine (NORVASC) 10 MG tablet Take 1 tablet (10 mg total) by mouth at bedtime. 90 tablet 1  . Ascorbic Acid (VITAMIN C) 1000 MG tablet Take 1,000 mg by mouth daily.    . Flaxseed, Linseed, (FLAXSEED OIL) 1000 MG CAPS Take 1,000 mg by mouth daily.    Marland Kitchen LORazepam (ATIVAN) 0.5 MG tablet Take 1 tablet (0.5 mg total) by mouth every 8 (eight) hours. (Patient taking differently: Take 0.5 mg by mouth every 8 (eight) hours as needed for anxiety or sleep. ) 30 tablet 0  . losartan (COZAAR) 50 MG tablet TAKE 1 TABLET(50 MG) BY MOUTH DAILY (Patient taking differently: Take 50 mg by mouth daily. ) 90 tablet 1  . oxyCODONE (OXY IR/ROXICODONE) 5 MG immediate release tablet Take 1 tablet (5 mg total) by mouth every 6 (six) hours as needed for moderate pain, severe pain or breakthrough pain. 5 tablet 0  . Polyethyl Glycol-Propyl Glycol (SYSTANE) 0.4-0.3 % SOLN Place 1 drop into both eyes 4 (four) times daily as needed (for dry eyes).    . prednisoLONE acetate (PRED FORTE) 1 % ophthalmic suspension Place 1 drop into the left eye 4 (four) times daily.     No current facility-administered medications for this visit.      OBJECTIVE:   Vitals:   03/02/19 1311  BP: 139/86  Pulse: (!) 102  Resp: 16  Temp: 98.5 F (36.9 C)  SpO2: 100%     Body mass index is 23.05 kg/m.   Wt Readings from Last 3 Encounters:  03/02/19 134 lb 5 oz (60.9 kg)  02/20/19 130 lb (59 kg)  02/13/19 137 lb 9.6 oz (62.4 kg)  ECOG FS:1 GENERAL: Patient is a well appearing female in no acute distress HEENT:  Eyes not examined, patient wearing sunglasses.  Oropharynx clear and moist. No ulcerations or evidence of oropharyngeal candidiasis. Neck is supple.  NODES:  No cervical, supraclavicular, or axillary lymphadenopathy palpated.  BREAST EXAM:  Left breast lumpectomy with steri strips covering, mild swelling underneath.  Healing well.  LUNGS:  Clear to auscultation bilaterally.  No wheezes or rhonchi. HEART:  Regular rate  and rhythm. No murmur appreciated. ABDOMEN:  Soft, nontender.  Positive, normoactive bowel sounds. No organomegaly palpated. MSK:  No focal spinal tenderness to palpation. Full range of motion bilaterally in the upper extremities. EXTREMITIES:  No peripheral edema.   SKIN:  Clear with no obvious rashes or skin changes. No nail dyscrasia. NEURO:  Nonfocal. Well oriented.  Appropriate affect.      LAB RESULTS:  CMP     Component Value Date/Time   NA 142 03/02/2019 1057   NA 140 10/12/2017 0950   K 3.4 (L) 03/02/2019 1057   CL 105 03/02/2019 1057   CO2 23 03/02/2019 1057   GLUCOSE  111 (H) 03/02/2019 1057   BUN 9 03/02/2019 1057   BUN 10 10/12/2017 0950   CREATININE 0.68 03/02/2019 1057   CREATININE 0.78 09/07/2018 1455   CALCIUM 9.9 03/02/2019 1057   PROT 7.4 03/02/2019 1057   PROT 7.5 10/12/2017 0950   ALBUMIN 4.1 03/02/2019 1057   ALBUMIN 4.3 10/12/2017 0950   AST 14 (L) 03/02/2019 1057   AST 14 (L) 09/07/2018 1455   ALT 9 03/02/2019 1057   ALT 13 09/07/2018 1455   ALKPHOS 94 03/02/2019 1057   BILITOT <0.2 (L) 03/02/2019 1057   BILITOT 0.2 (L) 09/07/2018 1455   GFRNONAA >60 03/02/2019 1057   GFRNONAA >60 09/07/2018 1455   GFRAA >60 03/02/2019 1057   GFRAA >60 09/07/2018 1455    No results found for: TOTALPROTELP, ALBUMINELP, A1GS, A2GS, BETS, BETA2SER, GAMS, MSPIKE, SPEI  No results found for: KPAFRELGTCHN, LAMBDASER, KAPLAMBRATIO  Lab Results  Component Value Date   WBC 5.7 03/02/2019   NEUTROABS 3.7 03/02/2019   HGB 12.2 03/02/2019   HCT 36.8 03/02/2019   MCV 97.9 03/02/2019   PLT 277 03/02/2019    Lab Results  Component Value Date   LABCA2 <4 08/30/2007    No components found for: [id number]  No results for input(s): INR in the last 168 hours.  Lab Results  Component Value Date   LABCA2 <4 08/30/2007    No results found for: MHD622  No results found for: WLN989  No results found for: QJJ941  No results found for: CA2729  No components  found for: HGQUANT  No results found for: CEA1 / No results found for: CEA1   No results found for: AFPTUMOR  No results found for: [place]  No results found for: PSA1  Appointment on 03/02/2019  Component Date Value Ref Range Status  . Sodium 03/02/2019 142  135 - 145 mmol/L Final  . Potassium 03/02/2019 3.4* 3.5 - 5.1 mmol/L Final  . Chloride 03/02/2019 105  98 - 111 mmol/L Final  . CO2 03/02/2019 23  22 - 32 mmol/L Final  . Glucose, Bld 03/02/2019 111* 70 - 99 mg/dL Final  . BUN 03/02/2019 9  6 - 20 mg/dL Final  . Creatinine, Ser 03/02/2019 0.68  0.44 - 1.00 mg/dL Final  . Calcium 03/02/2019 9.9  8.9 - 10.3 mg/dL Final  . Total Protein 03/02/2019 7.4  6.5 - 8.1 g/dL Final  . Albumin 03/02/2019 4.1  3.5 - 5.0 g/dL Final  . AST 03/02/2019 14* 15 - 41 U/L Final  . ALT 03/02/2019 9  0 - 44 U/L Final  . Alkaline Phosphatase 03/02/2019 94  38 - 126 U/L Final  . Total Bilirubin 03/02/2019 <0.2* 0.3 - 1.2 mg/dL Final  . GFR calc non Af Amer 03/02/2019 >60  >60 mL/min Final  . GFR calc Af Amer 03/02/2019 >60  >60 mL/min Final  . Anion gap 03/02/2019 14  5 - 15 Final   Performed at [facility], [place] [address] [place],  [address]  . WBC 03/02/2019 5.7  4.0 - 10.5 K/uL Final  . RBC 03/02/2019 3.76* 3.87 - 5.11 MIL/uL Final  . Hemoglobin 03/02/2019 12.2  12.0 - 15.0 g/dL Final  . HCT 03/02/2019 36.8  36.0 - 46.0 % Final  . MCV 03/02/2019 97.9  80.0 - 100.0 fL Final  . MCH 03/02/2019 32.4  26.0 - 34.0 pg Final  . MCHC 03/02/2019 33.2  30.0 - 36.0 g/dL Final  . RDW 03/02/2019 11.7  11.5 -  15.5 % Final  . Platelets 03/02/2019 277  150 - 400 K/uL Final  . nRBC 03/02/2019 0.0  0.0 - 0.2 % Final  . Neutrophils Relative % 03/02/2019 66  % Final  . Neutro Abs 03/02/2019 3.7  1.7 - 7.7 K/uL Final  . Lymphocytes Relative 03/02/2019 25  % Final  . Lymphs Abs 03/02/2019 1.4  0.7 - 4.0 K/uL Final  . Monocytes Relative 03/02/2019 9  % Final  . Monocytes  Absolute 03/02/2019 0.5  0.1 - 1.0 K/uL Final  . Eosinophils Relative 03/02/2019 0  % Final  . Eosinophils Absolute 03/02/2019 0.0  0.0 - 0.5 K/uL Final  . Basophils Relative 03/02/2019 0  % Final  . Basophils Absolute 03/02/2019 0.0  0.0 - 0.1 K/uL Final  . Immature Granulocytes 03/02/2019 0  % Final  . Abs Immature Granulocytes 03/02/2019 0.01  0.00 - 0.07 K/uL Final   Performed at [facility], [place] [address] [place], [place] [address]    (this displays the last labs from the last 3 days)  No results found for: TOTALPROTELP, ALBUMINELP, A1GS, A2GS, BETS, BETA2SER, GAMS, MSPIKE, SPEI (this displays SPEP labs)  No results found for: KPAFRELGTCHN, LAMBDASER, KAPLAMBRATIO (kappa/lambda light chains)  No results found for: HGBA, HGBA2QUANT, HGBFQUANT, HGBSQUAN (Hemoglobinopathy evaluation)   Lab Results  Component Value Date   LDH 169 08/30/2007    Lab Results  Component Value Date   IRON 78 01/01/2009   TIBC 424 08/30/2007   IRONPCTSAT 17.9 (L) 01/01/2009   (Iron and TIBC)  Lab Results  Component Value Date   FERRITIN 19 08/30/2007    Urinalysis    Component Value Date/Time   LABSPEC 1.015 04/09/2008 1548   PHURINE 7.0 04/09/2008 1548   HGBUR large 04/09/2008 1548   BILIRUBINUR negative 04/09/2008 1548   UROBILINOGEN 0.2 04/09/2008 1548   NITRITE negative 04/09/2008 1548     STUDIES:  No results found.  ELIGIBLE FOR AVAILABLE RESEARCH PROTOCOL:    ASSESSMENT: 53 y.o. [place], [place] woman  (1) history of left-sided ductal carcinoma in situ 2004  (a) s/pleft mastectomy with TRAM reconstruction  (b) status post tamoxifen x3 years  (2) left breast upper outer quadrant biopsy 08/30/2018 shows a clinical T1c N0 invasive ductal carcinoma, grade 3, estrogen receptor positive, progesterone receptor negative, with HER-2 amplification, and and MIB-1 of 15%.   (3) neoadjuvant chemotherapy will consist of carboplatin, docetaxel, trastuzumab  and Pertuzumab starting 09/26/2018, repeated every 21 days x 6.  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    (b) chemotherapy discontinued after 4 cycles because of intercurrent eye surgery  (c) continuing trastuzumab and Pertuzumab to complete a year  (d) echocardiogram on 01/23/2019 that shows well preserved EF of 60-65%  (4) definitive surgery on 02/20/2019 showed invasive ductal carcinoma, 1.7 cm, grade 2 with negative margins.    (5) adjuvant radiation therapy as appropriate  (6) antiestrogens to follow at the completion of local treatment  PLAN: Zula is doing well today.  We reviewed her pathology.  She is going to continue on Trastuzumab and Pertuzumab every three weeks.  She is tolerating this well and will proceed with this today.  Her next echocardiogram is due in 03/2019.    Murray Hodgkins and I reviewed her pathology results her margins are negative.  I reviewed that it is time for radiation oncology evaluation.  She was contacted yesterday to set up her initial consultation.  She  did not answer.  I encouraged her to call the radiation oncology department back.  We reviewed radiation, and its role in reducing the risk of local recurrence of her breast cancer.  She is willing to go for the consultation.    We reviewed that after she completes radiation she will start anti estrogen therapy in the form of a pill.  I let her know that we will talk about that further when it is time to start this therapy.    Anaissa will return in 3 weeks for labs, f/u, and her next Trastuzumab/Pertuzumab.  She was recommended to continue with the appropriate pandemic precautions. She knows to call for any other issue that may develop before her return visit here.  A total of (30) minutes of face-to-face time was spent with this patient with greater than 50% of that time in counseling and care-coordination.    Scot Dock, NP Medical Oncology  and Hematology [facility] [address] [place], [place] [address] Tel. [phone]    Fax. [phone]

## 2019-03-03 LAB — VITAMIN D 25 HYDROXY (VIT D DEFICIENCY, FRACTURES): Vit D, 25-Hydroxy: 33.9 ng/mL (ref 30.0–100.0)

## 2019-03-04 NOTE — Progress Notes (Signed)
The following biosimilar Ogivri (trastuzumab-dkst) has been selected for use in this patient.  Kennith Center, Pharm.D., CPP 03/04/2019@10 :04 AM

## 2019-03-05 ENCOUNTER — Telehealth: Payer: Self-pay | Admitting: Radiation Oncology

## 2019-03-05 ENCOUNTER — Telehealth: Payer: Self-pay | Admitting: Adult Health

## 2019-03-05 ENCOUNTER — Telehealth: Payer: Self-pay

## 2019-03-05 NOTE — Telephone Encounter (Signed)
I left a message regarding schedule  

## 2019-03-05 NOTE — Telephone Encounter (Signed)
Pt called an lvm on nurse direct line sometime over the weekend stating "I got chemo yesterday and today my heart is just beating real fast..."  "not sure if you are there on the weekends..."  "I am just stayin in bed today..."  Returned call to pt on number provided.  Received vm and left msg for pt to return call.

## 2019-03-05 NOTE — Telephone Encounter (Signed)
New message: ° ° °LVM for patient to return call to schedule from referral received. °

## 2019-03-07 ENCOUNTER — Telehealth: Payer: Self-pay

## 2019-03-07 NOTE — Telephone Encounter (Signed)
RN returned call.  Voicemail left for return call.  

## 2019-03-08 ENCOUNTER — Telehealth: Payer: Self-pay

## 2019-03-08 NOTE — Telephone Encounter (Signed)
-----   Message from Gardenia Phlegm, NP sent at 03/06/2019  2:54 PM EDT ----- Vitamin D level is normal, but low normal.  I recommend that she take vitamin d daily.  Is she taking any vitamin d? ----- Message ----- From: Interface, Lab In [place] Sent: 03/03/2019   6:36 AM EDT To: Gardenia Phlegm, NP

## 2019-03-08 NOTE — Telephone Encounter (Signed)
LVM for patient about vitamin D level and instructed that if she is not currently taking any Vitamin D that NP recommends to start.  Daily amount is 1000 UI.  Concluded that patient should call back if she has any questions or needs.

## 2019-03-15 ENCOUNTER — Ambulatory Visit: Payer: BC Managed Care – PPO

## 2019-03-15 ENCOUNTER — Other Ambulatory Visit: Payer: BC Managed Care – PPO

## 2019-03-22 ENCOUNTER — Inpatient Hospital Stay: Payer: BC Managed Care – PPO

## 2019-03-22 ENCOUNTER — Inpatient Hospital Stay (HOSPITAL_BASED_OUTPATIENT_CLINIC_OR_DEPARTMENT_OTHER): Payer: BC Managed Care – PPO | Admitting: Nurse Practitioner

## 2019-03-22 ENCOUNTER — Encounter: Payer: Self-pay | Admitting: Nurse Practitioner

## 2019-03-22 ENCOUNTER — Other Ambulatory Visit: Payer: Self-pay | Admitting: Oncology

## 2019-03-22 ENCOUNTER — Other Ambulatory Visit: Payer: Self-pay

## 2019-03-22 VITALS — BP 122/71 | HR 98 | Temp 98.3°F | Resp 18 | Ht 64.0 in | Wt 132.5 lb

## 2019-03-22 VITALS — BP 105/62 | HR 64 | Temp 98.0°F | Resp 16

## 2019-03-22 DIAGNOSIS — C50412 Malignant neoplasm of upper-outer quadrant of left female breast: Secondary | ICD-10-CM

## 2019-03-22 DIAGNOSIS — Z170 Estrogen receptor positive status [ER+]: Secondary | ICD-10-CM

## 2019-03-22 DIAGNOSIS — C50912 Malignant neoplasm of unspecified site of left female breast: Secondary | ICD-10-CM

## 2019-03-22 DIAGNOSIS — Z95828 Presence of other vascular implants and grafts: Secondary | ICD-10-CM

## 2019-03-22 LAB — CBC WITH DIFFERENTIAL/PLATELET
Abs Immature Granulocytes: 0.01 10*3/uL (ref 0.00–0.07)
Basophils Absolute: 0 10*3/uL (ref 0.0–0.1)
Basophils Relative: 0 %
Eosinophils Absolute: 0 10*3/uL (ref 0.0–0.5)
Eosinophils Relative: 0 %
HCT: 34.6 % — ABNORMAL LOW (ref 36.0–46.0)
Hemoglobin: 11.4 g/dL — ABNORMAL LOW (ref 12.0–15.0)
Immature Granulocytes: 0 %
Lymphocytes Relative: 31 %
Lymphs Abs: 1.7 10*3/uL (ref 0.7–4.0)
MCH: 32 pg (ref 26.0–34.0)
MCHC: 32.9 g/dL (ref 30.0–36.0)
MCV: 97.2 fL (ref 80.0–100.0)
Monocytes Absolute: 0.4 10*3/uL (ref 0.1–1.0)
Monocytes Relative: 6 %
Neutro Abs: 3.5 10*3/uL (ref 1.7–7.7)
Neutrophils Relative %: 63 %
Platelets: 263 10*3/uL (ref 150–400)
RBC: 3.56 MIL/uL — ABNORMAL LOW (ref 3.87–5.11)
RDW: 11.8 % (ref 11.5–15.5)
WBC: 5.6 10*3/uL (ref 4.0–10.5)
nRBC: 0 % (ref 0.0–0.2)

## 2019-03-22 LAB — COMPREHENSIVE METABOLIC PANEL
ALT: 12 U/L (ref 0–44)
AST: 12 U/L — ABNORMAL LOW (ref 15–41)
Albumin: 3.8 g/dL (ref 3.5–5.0)
Alkaline Phosphatase: 82 U/L (ref 38–126)
Anion gap: 6 (ref 5–15)
BUN: 9 mg/dL (ref 6–20)
CO2: 26 mmol/L (ref 22–32)
Calcium: 9.4 mg/dL (ref 8.9–10.3)
Chloride: 107 mmol/L (ref 98–111)
Creatinine, Ser: 0.72 mg/dL (ref 0.44–1.00)
GFR calc Af Amer: >60 mL/min (ref >60)
GFR calc non Af Amer: >60 mL/min (ref >60)
Glucose, Bld: 109 mg/dL — ABNORMAL HIGH (ref 70–99)
Potassium: 3.8 mmol/L (ref 3.5–5.1)
Sodium: 139 mmol/L (ref 135–145)
Total Bilirubin: 0.2 mg/dL — ABNORMAL LOW (ref 0.3–1.2)
Total Protein: 6.8 g/dL (ref 6.5–8.1)

## 2019-03-22 MED ORDER — SODIUM CHLORIDE 0.9% FLUSH
10.0000 mL | Freq: Once | INTRAVENOUS | Status: AC
  Administered 2019-03-22: 09:00:00 10 mL
  Filled 2019-03-22: qty 10

## 2019-03-22 MED ORDER — SODIUM CHLORIDE 0.9 % IV SOLN
420.0000 mg | Freq: Once | INTRAVENOUS | Status: AC
  Administered 2019-03-22: 420 mg via INTRAVENOUS
  Filled 2019-03-22: qty 14

## 2019-03-22 MED ORDER — LORAZEPAM 0.5 MG PO TABS: 0.5000 mg | ORAL_TABLET | Freq: Three times a day (TID) | ORAL | 0 refills | Status: DC | PRN

## 2019-03-22 MED ORDER — ACETAMINOPHEN 325 MG PO TABS
650.0000 mg | ORAL_TABLET | Freq: Once | ORAL | Status: AC
  Administered 2019-03-22: 650 mg via ORAL

## 2019-03-22 MED ORDER — DIPHENHYDRAMINE HCL 25 MG PO CAPS
ORAL_CAPSULE | ORAL | Status: AC
  Filled 2019-03-22: qty 1

## 2019-03-22 MED ORDER — TRASTUZUMAB-DKST CHEMO 150 MG IV SOLR
6.0000 mg/kg | Freq: Once | INTRAVENOUS | Status: AC
  Administered 2019-03-22: 12:00:00 399 mg via INTRAVENOUS
  Filled 2019-03-22: qty 19

## 2019-03-22 MED ORDER — SODIUM CHLORIDE 0.9% FLUSH
10.0000 mL | INTRAVENOUS | Status: DC | PRN
  Administered 2019-03-22: 10 mL
  Filled 2019-03-22: qty 10

## 2019-03-22 MED ORDER — SODIUM CHLORIDE 0.9 % IV SOLN
Freq: Once | INTRAVENOUS | Status: AC
  Administered 2019-03-22: 10:00:00 via INTRAVENOUS
  Filled 2019-03-22: qty 250

## 2019-03-22 MED ORDER — ACETAMINOPHEN 325 MG PO TABS
ORAL_TABLET | ORAL | Status: AC
  Filled 2019-03-22: qty 2

## 2019-03-22 MED ORDER — HEPARIN SOD (PORK) LOCK FLUSH 100 UNIT/ML IV SOLN
500.0000 [IU] | Freq: Once | INTRAVENOUS | Status: AC | PRN
  Administered 2019-03-22: 13:00:00 500 [IU]
  Filled 2019-03-22: qty 5

## 2019-03-22 MED ORDER — DIPHENHYDRAMINE HCL 25 MG PO CAPS
25.0000 mg | ORAL_CAPSULE | Freq: Once | ORAL | Status: AC
  Administered 2019-03-22: 10:00:00 25 mg via ORAL

## 2019-03-22 NOTE — Patient Instructions (Signed)
[facility] Discharge Instructions for Patients Receiving Chemotherapy  Today you received the following chemotherapy agents: trastuzumab, perjeta.  To help prevent nausea and vomiting after your treatment, we encourage you to take your nausea medication as prescribed.   If you develop nausea and vomiting that is not controlled by your nausea medication, call the clinic.   BELOW ARE SYMPTOMS THAT SHOULD BE REPORTED IMMEDIATELY:  *FEVER GREATER THAN 100.5 F  *CHILLS WITH OR WITHOUT FEVER  NAUSEA AND VOMITING THAT IS NOT CONTROLLED WITH YOUR NAUSEA MEDICATION  *UNUSUAL SHORTNESS OF BREATH  *UNUSUAL BRUISING OR BLEEDING  TENDERNESS IN MOUTH AND THROAT WITH OR WITHOUT PRESENCE OF ULCERS  *URINARY PROBLEMS  *BOWEL PROBLEMS  UNUSUAL RASH Items with * indicate a potential emergency and should be followed up as soon as possible.  Feel free to call the clinic should you have any questions or concerns. The clinic phone number is [phone].  Please show the [facility] at check-in to the Emergency Department and triage nurse.

## 2019-03-22 NOTE — Progress Notes (Signed)
Location of Breast Cancer: Left Breast  Histology per Pathology Report:  08/30/18 Diagnosis Breast, left, needle core biopsy, reconstructed (TRAM FLAP) breast mass at 2 o'clock, 10 cm fn - INVASIVE DUCTAL CARCINOMA. - DUCTAL CARCINOMA IN SITU.  Receptor Status: ER(100%), PR (NEG), Her2-neu (POS), Ki-(15%)  02/20/19 Diagnosis 1. Breast, lumpectomy, Left w/seed - INVASIVE DUCTAL CARCINOMA, 1.7 CM, NOTTINGHAM GRADE 2 OF 3. - MARGINS OF RESECTION ARE NOT INVOLVED (CLOSEST MARGIN: LESS THAN 1 MM, ANTERIOR/INFERIOR). - DUCTAL CARCINOMA IN SITU. - BIOPSY SITE CHANGES. - SEE ONCOLOGY TABLE. 2. Breast, excision, Left Medial - FIBROFATTY TISSUE, NEGATIVE FOR CARCINOMA. 3. Breast, excision, Left Inferior - FIBROFATTY TISSUE, NEGATIVE FOR CARCINOMA. 4. Breast, excision, Left Posterior - FIBROFATTY TISSUE, NEGATIVE FOR CARCINOMA.  Did patient present with symptoms or was this found on screening mammography?: She noted a upper outer quadrant left breast mass in June 2019. It was thought to be fat necrosis, and was recommended to follow up in 3 months. She did follow up recently and underwent U/S in early February.   Past/Anticipated interventions by surgeon, if any: 02/20/19 Procedure: Left breast seed guided lumpectomy Surgeon: Dr. Serita Grammes  Past/Anticipated interventions by medical oncology, if any: 03/22/19 Dr. Magrinat/ Wilber Bihari NP PLAN: [name] is doing well today.  We reviewed her pathology.  She is going to continue on Trastuzumab and Pertuzumab every three weeks.  She is tolerating this well and will proceed with this today.  Her next echocardiogram is due in 03/2019.    Murray Hodgkins and I reviewed her pathology results her margins are negative.  I reviewed that it is time for radiation oncology evaluation.  She was contacted yesterday to set up her initial consultation.  She did not answer.  I encouraged her to call the radiation oncology department back.  We reviewed radiation, and  its role in reducing the risk of local recurrence of her breast cancer.  She is willing to go for the consultation.    We reviewed that after she completes radiation she will start anti estrogen therapy in the form of a pill.  I let her know that we will talk about that further when it is time to start this therapy.    Ossie will return in 3 weeks for labs, f/u, and her next Trastuzumab/Pertuzumab.  She was recommended to continue with the appropriate pandemic precautions. She knows to call for any other issue that may develop before her return visit here.   Lymphedema issues, if any:  She denies.   Pain issues, if any:  No  SAFETY ISSUES:  Prior radiation? No  Pacemaker/ICD? No  Possible current pregnancy? No  Is the patient on methotrexate? No  Current Complaints / other details:   Paige Foster has a prior history of left breast cancer, dating back to 2004. At that time she underwent a left mastectomy for stage 0 (noninvasive) breast cancer, with transverse rectus abdominis (TRAM) flap construction under Dr. Towanda Malkin. She also underwent a right breast reduction. She took tamoxifen for three years.    Paige Foster, Stephani Police, RN 03/22/2019,9:00 AM

## 2019-03-22 NOTE — Progress Notes (Signed)
[place]   [phone] Fax:(336) [phone]   Clinic Follow up Note   Patient Care Team: Nche, Charlene Brooke, NP as PCP - General (Internal Medicine) Magrinat, Virgie Dad, MD as Consulting Physician (Oncology) Rolm Bookbinder, MD as Consulting Physician (General Surgery) Nche, Charlene Brooke, NP as Nurse Practitioner (Internal Medicine) Jalene Mullet, MD as Consulting Physician (Ophthalmology) Larey Dresser, MD as Consulting Physician (Cardiology) 03/22/2019  CHIEF COMPLAINT: Follow-up ER/HER-2 positive breast cancer  CURRENT THERAPY: Maintenance Herceptin/Perjeta  INTERVAL HISTORY: [name] returns for follow-up and treatment as scheduled. She feels well. Denies changes in her health since last visit. She feels "something running through my veins" during perjeta. Ativan helps her calm this "jittery" sensation. Appetite is normal. Denies n/v. Manages constipation with milk of mag. Denies bleeding. Denies fever, chills, cough, chest pain, dyspnea, skin rash. She notes scar tissue on her breast and is applying vitamin D per her surgeon. She is asking should she get flu vaccine.   MEDICAL HISTORY:  Past Medical History:  Diagnosis Date  . Anemia   . History of blood transfusion 2004  . History of colon polyps   . Hypertension   . Recurrent breast cancer, left [facility]) oncologist-- dr Jana Hakim    dx 2004, noninvasive Stage 0 ----s/p left mastectomy w/ tram flap construction (and right breast reduction), taken Tamoxifen for 3 yrs;   08-30-2018 recurrent left cancer , Grade III,  cT1c,  ER positive, PR negative, HER-2 positive, invasive ductal carcinoma-- neoadjuvant chemo to start 09-26-2018  . Renal artery stenosis (HCC)    mild right external renal artery stenosis per duplex in epic 08-09-2013  . Wears glasses     SURGICAL HISTORY: Past Surgical History:  Procedure Laterality Date  . BREAST LUMPECTOMY WITH RADIOACTIVE SEED LOCALIZATION Left 02/20/2019    Procedure: LEFT BREAST LUMPECTOMY WITH RADIOACTIVE SEED LOCALIZATION;  Surgeon: Rolm Bookbinder, MD;  Location: [facility];  Service: General;  Laterality: Left;  . BREAST SURGERY Left    Transflap  . COLONOSCOPY    . MASTECTOMY Left 2004   w/  TRAM flap construction and right breast augmentation with abdominoplasy  . PARS PLANA VITRECTOMY Left 12/18/2018   Procedure: PARS PLANA VITRECTOMY WITH 25 GAUGE, ENDOLASER;  Surgeon: Jalene Mullet, MD;  Location: [place];  Service: Ophthalmology;  Laterality: Left;  . PORTACATH PLACEMENT N/A 09/25/2018   Procedure: INSERTION PORT-A-CATH WITH ULTRASOUND;  Surgeon: Rolm Bookbinder, MD;  Location[facility];  Service: General;  Laterality: N/A;  . TUBAL LIGATION Bilateral yrs ago    I have reviewed the social history and family history with the patient and they are unchanged from previous note.  ALLERGIES:  has No Known Allergies.  MEDICATIONS:  Current Outpatient Medications  Medication Sig Dispense Refill  . amLODipine (NORVASC) 10 MG tablet Take 1 tablet (10 mg total) by mouth at bedtime. 90 tablet 1  . Ascorbic Acid (VITAMIN C) 1000 MG tablet Take 1,000 mg by mouth daily.    . Flaxseed, Linseed, (FLAXSEED OIL) 1000 MG CAPS Take 1,000 mg by mouth daily.    Marland Kitchen LORazepam (ATIVAN) 0.5 MG tablet Take 1 tablet (0.5 mg total) by mouth every 8 (eight) hours. (Patient taking differently: Take 0.5 mg by mouth every 8 (eight) hours as needed for anxiety or sleep. ) 30 tablet 0  . losartan (COZAAR) 50 MG tablet TAKE 1 TABLET(50 MG) BY MOUTH DAILY (Patient taking differently: Take 50 mg by mouth daily. ) 90 tablet 1  . Polyethyl Glycol-Propyl  Glycol (SYSTANE) 0.4-0.3 % SOLN Place 1 drop into both eyes 4 (four) times daily as needed (for dry eyes).    . prednisoLONE acetate (PRED FORTE) 1 % ophthalmic suspension Place 1 drop into the left eye 4 (four) times daily.    . oxyCODONE (OXY IR/ROXICODONE) 5 MG immediate release tablet Take 1 tablet (5 mg total) by mouth  every 6 (six) hours as needed for moderate pain, severe pain or breakthrough pain. 5 tablet 0   No current facility-administered medications for this visit.     PHYSICAL EXAMINATION: ECOG PERFORMANCE STATUS: 0 - Asymptomatic  Vitals:   03/22/19 0907  BP: 122/71  Pulse: 98  Resp: 18  Temp: 98.3 F (36.8 C)  SpO2: 100%   Filed Weights   03/22/19 0907  Weight: 132 lb 8 oz (60.1 kg)    GENERAL:alert, no distress and comfortable SKIN: no rash  EYES:  sclera clear LUNGS: clear to auscultation with normal breathing effort HEART: regular rate & rhythm edema NEURO: alert & oriented x 3 with fluent speech PAC without erythema  Breast: incisions well healed, no erythema or drainage. Complete breast exam deferred Limited exam for covid19 outbreak   LABORATORY DATA:  I have reviewed the data as listed CBC Latest Ref Rng & Units 03/22/2019 03/02/2019 02/13/2019  WBC 4.0 - 10.5 K/uL 5.6 5.7 6.0  Hemoglobin 12.0 - 15.0 g/dL 11.4(L) 12.2 11.8(L)  Hematocrit 36.0 - 46.0 % 34.6(L) 36.8 37.3  Platelets 150 - 400 K/uL 263 277 239     CMP Latest Ref Rng & Units 03/22/2019 03/02/2019 02/13/2019  Glucose 70 - 99 mg/dL 109(H) 111(H) 106(H)  BUN 6 - 20 mg/dL 9 9 10  Creatinine 0.44 - 1.00 mg/dL 0.72 0.68 0.72  Sodium 135 - 145 mmol/L 139 142 142  Potassium 3.5 - 5.1 mmol/L 3.8 3.4(L) 3.7  Chloride 98 - 111 mmol/L 107 105 108  CO2 22 - 32 mmol/L 26 23 25  Calcium 8.9 - 10.3 mg/dL 9.4 9.9 9.5  Total Protein 6.5 - 8.1 g/dL 6.8 7.4 -  Total Bilirubin 0.3 - 1.2 mg/dL 0.2(L) <0.2(L) -  Alkaline Phos 38 - 126 U/L 82 94 -  AST 15 - 41 U/L 12(L) 14(L) -  ALT 0 - 44 U/L 12 9 -      RADIOGRAPHIC STUDIES: I have personally reviewed the radiological images as listed and agreed with the findings in the report. No results found.   ASSESSMENT & PLAN per historical note: 53 y.o. Post, [place] woman  (1) history of left-sided ductal carcinoma in situ 2004             (a) s/pleft mastectomy with TRAM  reconstruction             (b) status post tamoxifen x3 years  (2) left breast upper outer quadrant biopsy 08/30/2018 shows a clinical T1c N0 invasive ductal carcinoma, grade 3, estrogen receptor positive, progesterone receptor negative, with HER-2 amplification, and and MIB-1 of 15%.   (3) neoadjuvant chemotherapy will consist of carboplatin, docetaxel, trastuzumab and Pertuzumab starting 09/26/2018, repeated every 21 days x 6.             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               (b) chemotherapy discontinued after 4 cycles because of intercurrent eye surgery             (c) continuing   trastuzumab and Pertuzumab to complete a year             (d) echocardiogram on 01/23/2019 that shows well preserved EF of 60-65%  (4) definitive surgery on 02/20/2019 showed invasive ductal carcinoma, 1.7 cm, grade 2 with negative margins.    Dispo: Ms. Koziel appears well. She completed another cycle of maintenance herceptin and perjeta. She tolerates treatment well overall. Labs reviewed, adequate for treatment. She will proceed with another cycle today, and continue q3 weeks to complete 1 year. She has rad onc consult with Dr. Isidore Moos on 9/2.   I recommend the flu vaccine when it becomes available. I refilled Ativan today. She will return for f/u and next cycle in 3 weeks.   All questions were answered. The patient knows to call the clinic with any problems, questions or concerns. No barriers to learning was detected.    Alla Feeling, NP 03/22/19

## 2019-03-23 ENCOUNTER — Telehealth: Payer: Self-pay | Admitting: Nurse Practitioner

## 2019-03-23 ENCOUNTER — Telehealth: Payer: Self-pay | Admitting: Oncology

## 2019-03-23 NOTE — Telephone Encounter (Signed)
I left a message on patients voicemail about schedule

## 2019-03-23 NOTE — Telephone Encounter (Signed)
Scheduled appt per 8/27 los.  Left a voice message of the appt date and time.

## 2019-03-26 ENCOUNTER — Telehealth: Payer: Self-pay | Admitting: *Deleted

## 2019-03-26 NOTE — Telephone Encounter (Signed)
This RN sent request per pt's vm stating need to have revision in schedule to lab and MD only and then following day chemo.

## 2019-03-28 ENCOUNTER — Ambulatory Visit
Admission: RE | Admit: 2019-03-28 | Discharge: 2019-03-28 | Disposition: A | Discharge status: Home / Self Care | Payer: BC Managed Care – PPO | Source: Ambulatory Visit | Attending: Radiation Oncology | Admitting: Radiation Oncology

## 2019-03-28 ENCOUNTER — Other Ambulatory Visit: Payer: Self-pay

## 2019-03-28 DIAGNOSIS — Z170 Estrogen receptor positive status [ER+]: Secondary | ICD-10-CM

## 2019-03-28 DIAGNOSIS — C50412 Malignant neoplasm of upper-outer quadrant of left female breast: Secondary | ICD-10-CM

## 2019-03-28 NOTE — Progress Notes (Signed)
Radiation Oncology         [phone] ________________________________  Initial outpatient Telephone Consultation due to pandemic precautions and patient was unable to access WebEx  Name: Paige Foster MRN: [id number]  Date: 03/28/2019  DOB: December 27, 1965  [id number], Charlene Brooke, NP  Magrinat, Virgie Dad, MD   REFERRING PHYSICIAN: Magrinat, Virgie Dad, MD  DIAGNOSIS:    ICD-10-CM   1. Malignant neoplasm of upper-outer quadrant of left breast in female, estrogen receptor positive ([place])  C50.412    Z17.0    Cancer Staging Malignant neoplasm of upper-outer quadrant of left breast in female, estrogen receptor positive ([place]) Staging form: Breast, AJCC 8th Edition - Clinical stage from 08/30/2018: Stage IA (cT1c, cN0, cM0, G3, ER+, PR-, HER2+) - Signed by Gardenia Phlegm, NP on 01/31/2019  Recurrent breast cancer, left ([place]) Staging form: Breast, AJCC 8th Edition - Clinical: Stage IIA (rcT2, cN0, cM0, G3, ER+, PR-, HER2+) - Unsigned  ypT1c, pNX  CHIEF COMPLAINT: Here to discuss management of recurrent left breast cancer  HISTORY OF PRESENT ILLNESS::Paige Foster is a 53 y.o. female who has a history of left breast DCIS in 2004. At that time, she underwent left mastectomy with TRAM reconstruction and right breast reduction, and she took tamoxifen for 3 years.   She presented with palpable non-tender mass in the upper-outer left breast.  Ultrasound of breast on 01/03/2018 revealed a mixed echogenicity 1.5 cm lesion in the upper-outer left breast. Close follow up was recommended. Repeat scan on 08/30/2018 showed an increase in size of the mass.   Biopsy on date of 08/30/2018 showed invasive ductal carcinoma.  ER status: 100%, positive; PR status 0%, negative, Her2 status positive; Grade 3.  MR breasts revealed 2.3 cm upper outer quadrant left breast mass in the reconstructed breast.  No suspicious lymph nodes appreciated. CT chest revealed no evidence of metastatic disease.  These 2 studies  were performed in February 2020  She was treated with neoadjuvant chemotherapy consisting of carboplatin, docetaxel, trastuzumab and Pertuzumab x4 cycles from 09/26/2018 to 12/01/2018 under Dr. Jana Hakim. She was planned for 6 cycles but discontinued after 4 due to intercurrent eye surgery.  She underwent genetics testing on 01/22/2019, results of which were negative.  She opted to proceed with left breast (TRAM) lumpectomy on date of 02/20/2019 with pathology report revealing: tumor size of 1.7 cm; histology of invasive ductal carcinoma; margin status to invasive disease of less than 1 mm and margin status to in situ disease of less than 1 mm; Grade 2. Prognostic panel not repeated.  She is currently on maintenance Trastuzumab and Pertuzumab under Dr. Jana Hakim.    PREVIOUS RADIATION THERAPY: No  PAST MEDICAL HISTORY:  has a past medical history of Anemia, History of blood transfusion (2004), History of colon polyps, Hypertension, Recurrent breast cancer, left [facility]) (oncologist-- dr Jana Hakim ), Renal artery stenosis [facility]), and Wears glasses.    PAST SURGICAL HISTORY: Past Surgical History:  Procedure Laterality Date  . BREAST LUMPECTOMY WITH RADIOACTIVE SEED LOCALIZATION Left 02/20/2019   Procedure: LEFT BREAST LUMPECTOMY WITH RADIOACTIVE SEED LOCALIZATION;  Surgeon: Rolm Bookbinder, MD;  Location: [place];  Service: General;  Laterality: Left;  . BREAST SURGERY Left    Transflap  . COLONOSCOPY    . MASTECTOMY Left 2004   w/  TRAM flap construction and right breast augmentation with abdominoplasy  . PARS PLANA VITRECTOMY Left 12/18/2018   Procedure: PARS PLANA VITRECTOMY WITH 25 GAUGE, ENDOLASER;  Surgeon: Jalene Mullet, MD;  Location:  [facility];  Service: Ophthalmology;  Laterality: Left;  . PORTACATH PLACEMENT N/A 09/25/2018   Procedure: INSERTION PORT-A-CATH WITH ULTRASOUND;  Surgeon: Rolm Bookbinder, MD;  [facility];  Service: General;  Laterality: N/A;  . TUBAL LIGATION Bilateral yrs  ago    FAMILY HISTORY: family history includes Diabetes in her mother; Hypertension in her mother; Kidney disease in her father.  SOCIAL HISTORY:  reports that she has never smoked. She has never used smokeless tobacco. She reports previous alcohol use. She reports that she does not use drugs.  ALLERGIES: Patient has no known allergies.  MEDICATIONS:  Current Outpatient Medications  Medication Sig Dispense Refill  . Ascorbic Acid (VITAMIN C) 1000 MG tablet Take 1,000 mg by mouth daily.    . Flaxseed, Linseed, (FLAXSEED OIL) 1000 MG CAPS Take 1,000 mg by mouth daily.    Marland Kitchen LORazepam (ATIVAN) 0.5 MG tablet Take 1 tablet (0.5 mg total) by mouth every 8 (eight) hours as needed for anxiety or sleep. 30 tablet 0  . losartan (COZAAR) 50 MG tablet TAKE 1 TABLET(50 MG) BY MOUTH DAILY (Patient taking differently: Take 50 mg by mouth daily. ) 90 tablet 1  . oxyCODONE (OXY IR/ROXICODONE) 5 MG immediate release tablet Take 1 tablet (5 mg total) by mouth every 6 (six) hours as needed for moderate pain, severe pain or breakthrough pain. 5 tablet 0  . Polyethyl Glycol-Propyl Glycol (SYSTANE) 0.4-0.3 % SOLN Place 1 drop into both eyes 4 (four) times daily as needed (for dry eyes).    . prednisoLONE acetate (PRED FORTE) 1 % ophthalmic suspension Place 1 drop into the left eye 4 (four) times daily.    Marland Kitchen amLODipine (NORVASC) 10 MG tablet Take 1 tablet (10 mg total) by mouth daily. Need office visit for additional refills 90 tablet 3   No current facility-administered medications for this encounter.     REVIEW OF SYSTEMS:  as above   PHYSICAL EXAM:  vitals were not taken for this visit.   General: Alert and oriented, in no acute distress    LABORATORY DATA:  Lab Results  Component Value Date   WBC 5.6 03/22/2019   HGB 11.4 (L) 03/22/2019   HCT 34.6 (L) 03/22/2019   MCV 97.2 03/22/2019   PLT 263 03/22/2019   CMP     Component Value Date/Time   NA 139 03/22/2019 0900   NA 140 10/12/2017 0950   K  3.8 03/22/2019 0900   CL 107 03/22/2019 0900   CO2 26 03/22/2019 0900   GLUCOSE 109 (H) 03/22/2019 0900   BUN 9 03/22/2019 0900   BUN 10 10/12/2017 0950   CREATININE 0.72 03/22/2019 0900   CREATININE 0.78 09/07/2018 1455   CALCIUM 9.4 03/22/2019 0900   PROT 6.8 03/22/2019 0900   PROT 7.5 10/12/2017 0950   ALBUMIN 3.8 03/22/2019 0900   ALBUMIN 4.3 10/12/2017 0950   AST 12 (L) 03/22/2019 0900   AST 14 (L) 09/07/2018 1455   ALT 12 03/22/2019 0900   ALT 13 09/07/2018 1455   ALKPHOS 82 03/22/2019 0900   BILITOT 0.2 (L) 03/22/2019 0900   BILITOT 0.2 (L) 09/07/2018 1455   GFRNONAA >60 03/22/2019 0900   GFRNONAA >60 09/07/2018 1455   GFRAA >60 03/22/2019 0900   GFRAA >60 09/07/2018 1455         RADIOGRAPHY:  As above     IMPRESSION/PLAN: Recurrent Left Breast Cancer   It was a pleasure meeting the patient today. We discussed the risks, benefits, and side  effects of radiotherapy. I recommend radiotherapy to the left TRAM reconstructed breast and regional nodes to reduce her risk of locoregional recurrence by 2/3.  We discussed that radiation would take approximately 6.5 weeks to complete and that her treatment planning could take place next week. We spoke about acute effects including skin irritation and fatigue as well as much less common late effects including internal organ injury or irritation. We spoke about the latest technology that is used to minimize the risk of late effects for patients undergoing radiotherapy to the breast or chest wall. No guarantees of treatment were given. The patient is enthusiastic about proceeding with treatment. I look forward to participating in the patient's care.   Simulation has been scheduled for next week and we will accommodate her work schedule by giving her afternoon appointments.  This encounter was provided by telemedicine platform telephone as patient was unable to access Webex.  The patient has given verbal consent for this type of  encounter and has been advised to only accept a meeting of this type in a secure network environment. The time spent during this encounter was 25 minutes. The attendants for this meeting include Eppie Gibson  and Charlott Holler.  During the encounter, Eppie Gibson was located at [facility] Radiation Oncology Department.  YILIA SACCA was located at home.   __________________________________________   Eppie Gibson, MD   This document serves as a record of services personally performed by Eppie Gibson, MD. It was created on her behalf by Wilburn Mylar, a trained medical scribe. The creation of this record is based on the scribe's personal observations and the provider's statements to them. This document has been checked and approved by the attending provider.

## 2019-03-29 ENCOUNTER — Telehealth: Payer: Self-pay | Admitting: Oncology

## 2019-03-29 NOTE — Telephone Encounter (Signed)
Called pt per 8/31 sch message - no answer - left message for patient to call back to reschedule appt .

## 2019-04-03 ENCOUNTER — Encounter: Payer: Self-pay | Admitting: Radiation Oncology

## 2019-04-03 ENCOUNTER — Other Ambulatory Visit: Payer: Self-pay | Admitting: Nurse Practitioner

## 2019-04-03 ENCOUNTER — Encounter: Payer: Self-pay | Admitting: *Deleted

## 2019-04-03 DIAGNOSIS — I10 Essential (primary) hypertension: Secondary | ICD-10-CM

## 2019-04-04 ENCOUNTER — Ambulatory Visit
Admission: RE | Admit: 2019-04-04 | Discharge: 2019-04-04 | Disposition: A | Discharge status: Home / Self Care | Payer: BC Managed Care – PPO | Source: Ambulatory Visit | Attending: Radiation Oncology | Admitting: Radiation Oncology

## 2019-04-04 ENCOUNTER — Other Ambulatory Visit: Payer: Self-pay

## 2019-04-04 DIAGNOSIS — C50412 Malignant neoplasm of upper-outer quadrant of left female breast: Secondary | ICD-10-CM | POA: Diagnosis present

## 2019-04-04 DIAGNOSIS — Z170 Estrogen receptor positive status [ER+]: Secondary | ICD-10-CM | POA: Diagnosis not present

## 2019-04-04 DIAGNOSIS — Z510 Encounter for antineoplastic radiation therapy: Secondary | ICD-10-CM | POA: Diagnosis not present

## 2019-04-04 NOTE — Progress Notes (Signed)
Radiation Oncology         [phone] ________________________________  Name: Paige Foster MRN: [id number]  Date: 04/04/2019  DOB: 07-17-1966  SIMULATION AND TREATMENT PLANNING NOTE // Special treatment procedure  Outpatient  DIAGNOSIS:     ICD-10-CM   1. Malignant neoplasm of upper-outer quadrant of left breast in female, estrogen receptor positive ([place])  C50.412    Z17.0     NARRATIVE:  The patient was brought to the [facility].  Identity was confirmed.  All relevant records and images related to the planned course of therapy were reviewed.  The patient freely provided informed written consent to proceed with treatment after reviewing the details related to the planned course of therapy. The consent form was witnessed and verified by the simulation staff.    Then, the patient was set-up in a stable reproducible supine position for radiation therapy with her ipsilateral arm over her head, and her upper body secured in a custom-made Vac-lok device.  CT images were obtained.  Surface markings were placed.  The CT images were loaded into the planning software.    Special treatment procedure:  Special treatment procedure was performed today due to the extra time and effort required by myself to plan and prepare this patient for deep inspiration breath hold technique.  I have determined cardiac sparing to be of benefit to this patient to prevent long term cardiac damage due to radiation of the heart.  Bellows were placed on the patient's abdomen. To facilitate cardiac sparing, the patient was coached by the radiation therapists on breath hold techniques and breathing practice was performed. Practice waveforms were obtained. The patient was then scanned while maintaining breath hold in the treatment position.  This image was then transferred over to the imaging specialist. The imaging specialist then created a fusion of the free breathing and breath hold scans using the chest  wall as the stable structure. I personally reviewed the fusion in axial, coronal and sagittal image planes.  Excellent cardiac sparing was obtained.  I felt the patient is an appropriate candidate for breath hold and the patient will be treated as such.  The image fusion was then reviewed with the patient to reinforce the necessity of reproducible breath hold.  TREATMENT PLANNING NOTE: Treatment planning then occurred.  The radiation prescription was entered and confirmed.     A total of 5 medically necessary complex treatment devices were fabricated and supervised by me: 4 fields with MLCs for custom blocks to protect heart, and lungs;  and, a Vac-lok. MORE COMPLEX DEVICES MAY BE MADE IN DOSIMETRY FOR FIELD IN FIELD BEAMS FOR DOSE HOMOGENEITY.  I have requested : 3D Simulation which is medically necessary to give adequate dose to at risk tissues while sparing lungs and heart.  I have requested a DVH of the following structures: lungs, heart, esophagus, cord.    The patient will receive 50.4 Gy in 28 fractions to the left chest wall/TRAM flap and regional nodes with 4 fields.  This will be followed by a boost.  Optical Surface Tracking Plan:  Since intensity modulated radiotherapy (IMRT) and 3D conformal radiation treatment methods are predicated on accurate and precise positioning for treatment, intrafraction motion monitoring is medically necessary to ensure accurate and safe treatment delivery. The ability to quantify intrafraction motion without excessive ionizing radiation dose can only be performed with optical surface tracking. Accordingly, surface imaging offers the opportunity to obtain 3D measurements of patient position throughout IMRT and 3D  treatments without excessive radiation exposure. I am ordering optical surface tracking for this patient's upcoming course of radiotherapy.  ________________________________   Reference:  Ursula Alert, J, et al. Surface imaging-based  analysis of intrafraction motion for breast radiotherapy patients.Journal of [place], n. 6, nov. 2014. ISSN [id number].  Available at: <http://www.jacmp.org/index.php/jacmp/article/view/4957>.    -----------------------------------  Eppie Gibson, MD

## 2019-04-06 DIAGNOSIS — C50412 Malignant neoplasm of upper-outer quadrant of left female breast: Secondary | ICD-10-CM | POA: Diagnosis not present

## 2019-04-08 NOTE — Progress Notes (Signed)
Impression: ultrasound guided biopsy  Ultrasound guided biopsy of the 1.8 cm solid, palpable mass I th left TRAM reconstructed breast at 2 o'clock posterior depth 10 cm from the nipple was successful.

## 2019-04-10 NOTE — Progress Notes (Signed)
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  She started her radiation treatments yesterday.  So far she is doing well with that.  Her last echocardiogram was on 01/23/2019 and showed an EF of 60-65%.  I do not see one scheduled for later this month yet.  She has gone back to work and it is very difficult for her to take any time off so she is trying to do her best to juggle her treatments here without losing more work time  She is tolerating the trastuzumab and Pertuzumab with no side effects that she is aware of although sometimes she feels there is "something running through my veins" which is not pain not itching and not a rash, and which resolves without intervention  REVIEW OF SYSTEMS: Abbygayle lives with her husband and 2 sons.  Everybody works outside the home.  They are doing their best to not bring the virus home to everybody else.  She has not had any unusual headaches visual changes cough phlegm production pleurisy shortness of breath or change in bowel or bladder habits.   HISTORY OF CURRENT ILLNESS: From the original intake note:  TRAVONNA SWINDLE has a prior history of left breast cancer, dating back to 2004. At that  time she underwent a left mastectomy for stage 0 (noninvasive) breast cancer, with transverse rectus abdominis (TRAM) flap construction under Dr. Towanda Malkin. She also underwent a right breast reduction. She took tamoxifen for three years.  More recently she underwent bilateral diagnostic mammography with tomography and left breast ultrasonography at [facility] on 01/03/2018 showing: Breast Density Category B. There is an oval fat containing lesion in the left breast upper outer quadrant posterior depth. No other significant masses, calcifications, or other findings are seen in either breast. Sonographically, there is a 1.5 cm lesion in the left breast upper outer quadrant posterior depth. This lesion is of mixed echogenicity. This correlates as palpated and with mammography findings. Follow up was recommended.  Close follow-up was suggested.  She then presented with a non-tender mass in the left reconstructed breast on 08/30/2018. On physical exam, there is a hard palpable lump measuring 2.0 cm in the upper outer left reconstructed breast 10 cm from the expected location of a nipple. Sonography over this area demonstrates a 1.8 cm x 1.7 cm x 1.4 cm mass in the left breast at 2 o'clock posterior depth 10 cm from the nipple. This mass is of mixed echogenicity. This abnormality is increased in size and correlates as palpated and with prior mammography findings. Color flow imaging demonstrates that there is vascularity present. Elastography imaging assessment is intermediate. No significant abnormalities were seen sonographically in the left axilla.    Accordingly on 08/30/2018 she proceeded to biopsy of the left breast mass in question. The pathology from this procedure showed ([id number]): invasive ductal carcinoma, grade III. Prognostic indicators  significant for: estrogen receptor, 100% positive with strong staining intensity and progesterone receptor, 0% negative. Proliferation marker Ki67 at 15%. HER2 positive  (3+) by immunohistochemistry.  The patient's subsequent history is as detailed below.   PAST MEDICAL HISTORY: Past Medical History:  Diagnosis Date   Anemia    History of blood transfusion 2004   History of colon polyps    Hypertension    Recurrent breast cancer, left [facility]) oncologist-- dr Jana Hakim    dx 2004, noninvasive Stage 0 ----s/p left mastectomy w/ tram flap construction (and right breast reduction), taken Tamoxifen for 3 yrs;   08-30-2018 recurrent left cancer , Grade III,  cT1c,  ER positive, PR negative, HER-2 positive, invasive ductal carcinoma-- neoadjuvant chemo to start 09-26-2018   Renal artery stenosis (HCC)    mild right external renal artery stenosis per duplex in epic 08-09-2013   Wears glasses     PAST SURGICAL HISTORY: Past Surgical History:  Procedure Laterality Date   BREAST LUMPECTOMY WITH RADIOACTIVE SEED LOCALIZATION Left 02/20/2019   Procedure: LEFT BREAST LUMPECTOMY WITH RADIOACTIVE SEED LOCALIZATION;  Surgeon: Rolm Bookbinder, MD;  Location: [facility];  Service: General;  Laterality: Left;   BREAST SURGERY Left    Transflap   COLONOSCOPY     MASTECTOMY Left 2004   w/  TRAM flap construction and right breast augmentation with abdominoplasy   PARS PLANA VITRECTOMY Left 12/18/2018   Procedure: PARS PLANA VITRECTOMY WITH 25 GAUGE, ENDOLASER;  Surgeon: Jalene Mullet, MD;  Location: [facility];  Service: Ophthalmology;  Laterality: Left;   PORTACATH PLACEMENT N/A 09/25/2018   Procedure: INSERTION PORT-A-CATH WITH ULTRASOUND;  Surgeon: Rolm Bookbinder, MD;  Location: [facility];  Service: General;  Laterality: N/A;   TUBAL LIGATION Bilateral yrs ago     FAMILY HISTORY: Family History  Problem Relation Age of Onset   Diabetes Mother    Hypertension Mother    Kidney disease Father    Colon cancer Neg Hx    Colon polyps Neg Hx    Gallbladder disease Neg Hx    Heart disease Neg Hx    Esophageal cancer Neg Hx    Derya's father died from  unknown causes in his early 29's. Patients' mother died from diabetes complications at age 51. The patient has 1 sister. Patient denies anyone in her family having breast, ovarian, prostate, or pancreatic cancer.    GYNECOLOGIC HISTORY:  Patient's last menstrual period was 06/08/2007. 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 Her husband, Elwin Mocha, works at [employer]. Maren has two children, Vonna Kotyk and Shanon Brow. Vonna Kotyk lives with her, is 71, and it attending [employer] for a computer based degree. Shanon Brow lives with her, is 42, and recently graduated from [employer] with a degree in Careers information officer. Syvilla has no grandchildren. She attends the [employer].   ADVANCED DIRECTIVES: Her husband, Elwin Mocha, is automatically her healthcare power of attorney     HEALTH MAINTENANCE: Social History   Tobacco Use   Smoking status: Never Smoker   Smokeless tobacco: Never Used  Substance Use Topics   Alcohol use: Not Currently    Alcohol/week: 0.0 standard drinks    Comment: Occassionally   Drug use: No    Colonoscopy: yes  PAP:   Bone density: yes, 2017; -1.6, osteopenic   No Known  Allergies  Current Outpatient Medications  Medication Sig Dispense Refill   amLODipine (NORVASC) 10 MG tablet Take 1 tablet (10 mg total) by mouth daily. Need office visit for additional refills 90 tablet 3   Ascorbic Acid (VITAMIN C) 1000 MG tablet Take 1,000 mg by mouth daily.     Flaxseed, Linseed, (FLAXSEED OIL) 1000 MG CAPS Take 1,000 mg by mouth daily.     LORazepam (ATIVAN) 0.5 MG tablet Take 1 tablet (0.5 mg total) by mouth every 8 (eight) hours as needed for anxiety or sleep. 30 tablet 0   losartan (COZAAR) 50 MG tablet TAKE 1 TABLET(50 MG) BY MOUTH DAILY (Patient taking differently: Take 50 mg by mouth daily. ) 90  tablet 1   oxyCODONE (OXY IR/ROXICODONE) 5 MG immediate release tablet Take 1 tablet (5 mg total) by mouth every 6 (six) hours as needed for moderate pain, severe pain or breakthrough pain. 5 tablet 0   Polyethyl Glycol-Propyl Glycol (SYSTANE) 0.4-0.3 % SOLN Place 1 drop into both eyes 4 (four) times daily as needed (for dry eyes).     prednisoLONE acetate (PRED FORTE) 1 % ophthalmic suspension Place 1 drop into the left eye 4 (four) times daily.     No current facility-administered medications for this visit.      OBJECTIVE: Middle-aged African-American woman in no acute distress  Vitals:   04/12/19 0838  BP: (!) 151/95  Pulse: 78  Resp: 18  Temp: 98 F (36.7 C)  SpO2: 100%     Body mass index is 22.5 kg/m.   Wt Readings from Last 3 Encounters:  04/12/19 131 lb 1.6 oz (59.5 kg)  03/22/19 132 lb 8 oz (60.1 kg)  03/02/19 134 lb 5 oz (60.9 kg)  ECOG FS:1  Sclerae unicteric, EOMs intact Wearing a mask No cervical or supraclavicular adenopathy Lungs no rales or rhonchi Heart regular rate and rhythm Abd soft, nontender, positive bowel sounds MSK no focal spinal tenderness, no upper extremity lymphedema Neuro: nonfocal, well oriented, appropriate affect Breasts: The right breast is benign.  The left breast is status post mastectomy with TRAM flap reconstruction.  Both axillae are benign   LAB RESULTS:  CMP     Component Value Date/Time   NA 139 03/22/2019 0900   NA 140 10/12/2017 0950   K 3.8 03/22/2019 0900   CL 107 03/22/2019 0900   CO2 26 03/22/2019 0900   GLUCOSE 109 (H) 03/22/2019 0900   BUN 9 03/22/2019 0900   BUN 10 10/12/2017 0950   CREATININE 0.72 03/22/2019 0900   CREATININE 0.78 09/07/2018 1455   CALCIUM 9.4 03/22/2019 0900   PROT 6.8 03/22/2019 0900   PROT 7.5 10/12/2017 0950   ALBUMIN 3.8 03/22/2019 0900   ALBUMIN 4.3 10/12/2017 0950   AST 12 (L) 03/22/2019 0900   AST 14 (L) 09/07/2018 1455   ALT 12 03/22/2019 0900   ALT 13 09/07/2018 1455    ALKPHOS 82 03/22/2019 0900   BILITOT 0.2 (L) 03/22/2019 0900   BILITOT 0.2 (L) 09/07/2018 1455   GFRNONAA >60 03/22/2019 0900   GFRNONAA >60 09/07/2018 1455   GFRAA >60 03/22/2019 0900   GFRAA >60 09/07/2018 1455    No results found for: TOTALPROTELP, ALBUMINELP, A1GS, A2GS, BETS, BETA2SER, GAMS, MSPIKE, SPEI  No results found for: KPAFRELGTCHN, LAMBDASER, KAPLAMBRATIO  Lab Results  Component Value Date   WBC 5.4 04/12/2019   NEUTROABS 2.9 04/12/2019   HGB 11.8 (L) 04/12/2019   HCT 36.0 04/12/2019   MCV 96.3 04/12/2019  PLT 266 04/12/2019    Lab Results  Component Value Date   LABCA2 <4 08/30/2007    No components found for: [id number]  No results for input(s): INR in the last 168 hours.  Lab Results  Component Value Date   LABCA2 <4 08/30/2007    No results found for: GYF749  No results found for: SWH675  No results found for: FFM384  No results found for: CA2729  No components found for: HGQUANT  No results found for: CEA1 / No results found for: CEA1   No results found for: AFPTUMOR  No results found for: [place]  No results found for: PSA1  Appointment on 04/12/2019  Component Date Value Ref Range Status   WBC 04/12/2019 5.4  4.0 - 10.5 K/uL Final   RBC 04/12/2019 3.74* 3.87 - 5.11 MIL/uL Final   Hemoglobin 04/12/2019 11.8* 12.0 - 15.0 g/dL Final   HCT 04/12/2019 36.0  36.0 - 46.0 % Final   MCV 04/12/2019 96.3  80.0 - 100.0 fL Final   MCH 04/12/2019 31.6  26.0 - 34.0 pg Final   MCHC 04/12/2019 32.8  30.0 - 36.0 g/dL Final   RDW 04/12/2019 12.3  11.5 - 15.5 % Final   Platelets 04/12/2019 266  150 - 400 K/uL Final   nRBC 04/12/2019 0.0  0.0 - 0.2 % Final   Neutrophils Relative % 04/12/2019 53  % Final   Neutro Abs 04/12/2019 2.9  1.7 - 7.7 K/uL Final   Lymphocytes Relative 04/12/2019 37  % Final   Lymphs Abs 04/12/2019 2.0  0.7 - 4.0 K/uL Final   Monocytes Relative 04/12/2019 8  % Final   Monocytes Absolute 04/12/2019 0.5   0.1 - 1.0 K/uL Final   Eosinophils Relative 04/12/2019 1  % Final   Eosinophils Absolute 04/12/2019 0.0  0.0 - 0.5 K/uL Final   Basophils Relative 04/12/2019 1  % Final   Basophils Absolute 04/12/2019 0.0  0.0 - 0.1 K/uL Final   Immature Granulocytes 04/12/2019 0  % Final   Abs Immature Granulocytes 04/12/2019 0.01  0.00 - 0.07 K/uL Final   Performed at [facility], [place] [address], [place], [place] [address]    (this displays the last labs from the last 3 days)  No results found for: TOTALPROTELP, ALBUMINELP, A1GS, A2GS, BETS, BETA2SER, GAMS, MSPIKE, SPEI (this displays SPEP labs)  No results found for: KPAFRELGTCHN, LAMBDASER, KAPLAMBRATIO (kappa/lambda light chains)  No results found for: HGBA, HGBA2QUANT, HGBFQUANT, HGBSQUAN (Hemoglobinopathy evaluation)   Lab Results  Component Value Date   LDH 169 08/30/2007    Lab Results  Component Value Date   IRON 78 01/01/2009   TIBC 424 08/30/2007   IRONPCTSAT 17.9 (L) 01/01/2009   (Iron and TIBC)  Lab Results  Component Value Date   FERRITIN 19 08/30/2007    Urinalysis    Component Value Date/Time   LABSPEC 1.015 04/09/2008 1548   PHURINE 7.0 04/09/2008 1548   HGBUR large 04/09/2008 1548   BILIRUBINUR negative 04/09/2008 1548   UROBILINOGEN 0.2 04/09/2008 1548   NITRITE negative 04/09/2008 1548     STUDIES:  No results found.  ELIGIBLE FOR AVAILABLE RESEARCH PROTOCOL:    ASSESSMENT: 53 y.o. [place], [place] woman  (1) history of left-sided ductal carcinoma in situ 2004  (a) s/p left mastectomy with TRAM reconstruction  (b) status post tamoxifen x3 years  (2) left breast upper outer quadrant biopsy 08/30/2018 shows a clinical T1c N0 invasive ductal carcinoma, grade 3, estrogen receptor positive,  progesterone receptor negative, with HER-2 amplification, and and MIB-1 of 15%.   (3) neoadjuvant chemotherapy consisting of carboplatin, docetaxel, trastuzumab and Pertuzumab starting  09/26/2018, repeated every 21 days x 6.  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    (b) chemotherapy discontinued after 4 cycles because of intercurrent eye surgery  (c) continuing trastuzumab and Pertuzumab to complete a year  (d) echocardiogram on 01/23/2019 that shows well preserved EF of 60-65%  (4) left lumpectomy 02/20/2019 showed a residual  ypT1c NX invasive ductal carcinoma, grade 2 with negative margins.    (5) adjuvant radiation started 04/10/2019  (6) antiestrogens to follow at the completion of local treatment  PLAN: Atarah has recovered well from her chemotherapy.  She has thick here, salt-and-pepper, under her way good.  She is back to work full-time.  She has very little free time at this point because she used up all her vacation and family leave time and she would like to schedule her chemotherapy treatments so that they correlate as well as possible with her radiation treatments.  We will do what we can to accomplish this.  She needs a repeat echo sometime this month and that order has been placed.  She will see me again late October.  She knows to call for any other issue that may develop before that visit.  Chauncey Cruel, MD Medical Oncology and Hematology [facility] [place], [place] [address] Tel. [phone]    Fax. [phone]   I, Wilburn Mylar, am acting as scribe for Dr. Virgie Dad. Leita Lindbloom.  I, Lurline Del MD, have reviewed the above documentation for accuracy and completeness, and I agree with the above.

## 2019-04-11 ENCOUNTER — Other Ambulatory Visit: Payer: Self-pay

## 2019-04-11 ENCOUNTER — Ambulatory Visit
Admission: RE | Admit: 2019-04-11 | Discharge: 2019-04-11 | Disposition: A | Discharge status: Home / Self Care | Payer: BC Managed Care – PPO | Source: Ambulatory Visit | Attending: Radiation Oncology | Admitting: Radiation Oncology

## 2019-04-11 DIAGNOSIS — C50412 Malignant neoplasm of upper-outer quadrant of left female breast: Secondary | ICD-10-CM | POA: Diagnosis not present

## 2019-04-11 DIAGNOSIS — Z170 Estrogen receptor positive status [ER+]: Secondary | ICD-10-CM

## 2019-04-11 MED ORDER — RADIAPLEXRX EX GEL
Freq: Once | CUTANEOUS | Status: AC
  Administered 2019-04-11: 16:00:00 via TOPICAL

## 2019-04-11 MED ORDER — ALRA NON-METALLIC DEODORANT (RAD-ONC)
1.0000 "application " | Freq: Once | TOPICAL | Status: AC
  Administered 2019-04-11: 1 via TOPICAL

## 2019-04-11 NOTE — Progress Notes (Signed)

## 2019-04-12 ENCOUNTER — Inpatient Hospital Stay: Payer: BC Managed Care – PPO

## 2019-04-12 ENCOUNTER — Inpatient Hospital Stay: Payer: BC Managed Care – PPO | Attending: Oncology

## 2019-04-12 ENCOUNTER — Inpatient Hospital Stay (HOSPITAL_BASED_OUTPATIENT_CLINIC_OR_DEPARTMENT_OTHER): Payer: BC Managed Care – PPO | Admitting: Oncology

## 2019-04-12 ENCOUNTER — Other Ambulatory Visit: Payer: Self-pay

## 2019-04-12 ENCOUNTER — Ambulatory Visit
Admission: RE | Admit: 2019-04-12 | Discharge: 2019-04-12 | Disposition: A | Discharge status: Home / Self Care | Payer: BC Managed Care – PPO | Source: Ambulatory Visit | Attending: Radiation Oncology | Admitting: Radiation Oncology

## 2019-04-12 VITALS — BP 140/83 | HR 68 | Temp 98.1°F | Resp 18

## 2019-04-12 VITALS — BP 151/95 | HR 78 | Temp 98.0°F | Resp 18 | Ht 64.0 in | Wt 131.1 lb

## 2019-04-12 DIAGNOSIS — Z170 Estrogen receptor positive status [ER+]: Secondary | ICD-10-CM

## 2019-04-12 DIAGNOSIS — C50912 Malignant neoplasm of unspecified site of left female breast: Secondary | ICD-10-CM

## 2019-04-12 DIAGNOSIS — C50412 Malignant neoplasm of upper-outer quadrant of left female breast: Secondary | ICD-10-CM

## 2019-04-12 DIAGNOSIS — Z79899 Other long term (current) drug therapy: Secondary | ICD-10-CM | POA: Insufficient documentation

## 2019-04-12 DIAGNOSIS — Z5112 Encounter for antineoplastic immunotherapy: Secondary | ICD-10-CM | POA: Diagnosis not present

## 2019-04-12 DIAGNOSIS — Z23 Encounter for immunization: Secondary | ICD-10-CM

## 2019-04-12 DIAGNOSIS — Z95828 Presence of other vascular implants and grafts: Secondary | ICD-10-CM

## 2019-04-12 DIAGNOSIS — Z9012 Acquired absence of left breast and nipple: Secondary | ICD-10-CM | POA: Diagnosis not present

## 2019-04-12 LAB — CBC WITH DIFFERENTIAL/PLATELET
Abs Immature Granulocytes: 0.01 10*3/uL (ref 0.00–0.07)
Basophils Absolute: 0 10*3/uL (ref 0.0–0.1)
Basophils Relative: 1 %
Eosinophils Absolute: 0 10*3/uL (ref 0.0–0.5)
Eosinophils Relative: 1 %
HCT: 36 % (ref 36.0–46.0)
Hemoglobin: 11.8 g/dL — ABNORMAL LOW (ref 12.0–15.0)
Immature Granulocytes: 0 %
Lymphocytes Relative: 37 %
Lymphs Abs: 2 10*3/uL (ref 0.7–4.0)
MCH: 31.6 pg (ref 26.0–34.0)
MCHC: 32.8 g/dL (ref 30.0–36.0)
MCV: 96.3 fL (ref 80.0–100.0)
Monocytes Absolute: 0.5 10*3/uL (ref 0.1–1.0)
Monocytes Relative: 8 %
Neutro Abs: 2.9 10*3/uL (ref 1.7–7.7)
Neutrophils Relative %: 53 %
Platelets: 266 10*3/uL (ref 150–400)
RBC: 3.74 MIL/uL — ABNORMAL LOW (ref 3.87–5.11)
RDW: 12.3 % (ref 11.5–15.5)
WBC: 5.4 10*3/uL (ref 4.0–10.5)
nRBC: 0 % (ref 0.0–0.2)

## 2019-04-12 LAB — COMPREHENSIVE METABOLIC PANEL
ALT: 10 U/L (ref 0–44)
AST: 14 U/L — ABNORMAL LOW (ref 15–41)
Albumin: 4.1 g/dL (ref 3.5–5.0)
Alkaline Phosphatase: 82 U/L (ref 38–126)
Anion gap: 10 (ref 5–15)
BUN: 9 mg/dL (ref 6–20)
CO2: 25 mmol/L (ref 22–32)
Calcium: 9.5 mg/dL (ref 8.9–10.3)
Chloride: 107 mmol/L (ref 98–111)
Creatinine, Ser: 0.68 mg/dL (ref 0.44–1.00)
GFR calc Af Amer: >60 mL/min (ref >60)
GFR calc non Af Amer: >60 mL/min (ref >60)
Glucose, Bld: 104 mg/dL — ABNORMAL HIGH (ref 70–99)
Potassium: 3.9 mmol/L (ref 3.5–5.1)
Sodium: 142 mmol/L (ref 135–145)
Total Bilirubin: <0.2 mg/dL — ABNORMAL LOW (ref 0.3–1.2)
Total Protein: 7 g/dL (ref 6.5–8.1)

## 2019-04-12 MED ORDER — LORAZEPAM 0.5 MG PO TABS: 0.5000 mg | ORAL_TABLET | Freq: Three times a day (TID) | ORAL | 0 refills | Status: DC | PRN

## 2019-04-12 MED ORDER — SODIUM CHLORIDE 0.9 % IV SOLN
Freq: Once | INTRAVENOUS | Status: AC
  Administered 2019-04-12: 09:00:00 via INTRAVENOUS
  Filled 2019-04-12: qty 250

## 2019-04-12 MED ORDER — SODIUM CHLORIDE 0.9% FLUSH
10.0000 mL | Freq: Once | INTRAVENOUS | Status: AC
  Administered 2019-04-12: 08:00:00 10 mL
  Filled 2019-04-12: qty 10

## 2019-04-12 MED ORDER — SODIUM CHLORIDE 0.9% FLUSH
10.0000 mL | INTRAVENOUS | Status: DC | PRN
  Administered 2019-04-12: 12:00:00 10 mL
  Filled 2019-04-12: qty 10

## 2019-04-12 MED ORDER — HEPARIN SOD (PORK) LOCK FLUSH 100 UNIT/ML IV SOLN
500.0000 [IU] | Freq: Once | INTRAVENOUS | Status: AC | PRN
  Administered 2019-04-12: 12:00:00 500 [IU]
  Filled 2019-04-12: qty 5

## 2019-04-12 MED ORDER — ACETAMINOPHEN 325 MG PO TABS
ORAL_TABLET | ORAL | Status: AC
  Filled 2019-04-12: qty 2

## 2019-04-12 MED ORDER — DIPHENHYDRAMINE HCL 25 MG PO CAPS
25.0000 mg | ORAL_CAPSULE | Freq: Once | ORAL | Status: AC
  Administered 2019-04-12: 25 mg via ORAL

## 2019-04-12 MED ORDER — TRASTUZUMAB-DKST CHEMO 150 MG IV SOLR
6.0000 mg/kg | Freq: Once | INTRAVENOUS | Status: AC
  Administered 2019-04-12: 10:00:00 399 mg via INTRAVENOUS
  Filled 2019-04-12: qty 19

## 2019-04-12 MED ORDER — SODIUM CHLORIDE 0.9 % IV SOLN
420.0000 mg | Freq: Once | INTRAVENOUS | Status: AC
  Administered 2019-04-12: 11:00:00 420 mg via INTRAVENOUS
  Filled 2019-04-12: qty 14

## 2019-04-12 MED ORDER — INFLUENZA VAC SPLIT QUAD 0.5 ML IM SUSY
0.5000 mL | PREFILLED_SYRINGE | Freq: Once | INTRAMUSCULAR | Status: AC
  Administered 2019-04-12: 10:00:00 0.5 mL via INTRAMUSCULAR

## 2019-04-12 MED ORDER — DIPHENHYDRAMINE HCL 25 MG PO CAPS
ORAL_CAPSULE | ORAL | Status: AC
  Filled 2019-04-12: qty 1

## 2019-04-12 MED ORDER — ACETAMINOPHEN 325 MG PO TABS
650.0000 mg | ORAL_TABLET | Freq: Once | ORAL | Status: AC
  Administered 2019-04-12: 09:00:00 650 mg via ORAL

## 2019-04-12 MED ORDER — INFLUENZA VAC SPLIT QUAD 0.5 ML IM SUSY
PREFILLED_SYRINGE | INTRAMUSCULAR | Status: AC
  Filled 2019-04-12: qty 0.5

## 2019-04-12 NOTE — Patient Instructions (Signed)
[facility] Discharge Instructions for Patients Receiving Chemotherapy  Today you received the following chemotherapy agents Trastuzumab (OGIVRI) & Pertuzumab (PERJETA).  To help prevent nausea and vomiting after your treatment, we encourage you to take your nausea medication as prescribed.   If you develop nausea and vomiting that is not controlled by your nausea medication, call the clinic.   BELOW ARE SYMPTOMS THAT SHOULD BE REPORTED IMMEDIATELY:  *FEVER GREATER THAN 100.5 F  *CHILLS WITH OR WITHOUT FEVER  NAUSEA AND VOMITING THAT IS NOT CONTROLLED WITH YOUR NAUSEA MEDICATION  *UNUSUAL SHORTNESS OF BREATH  *UNUSUAL BRUISING OR BLEEDING  TENDERNESS IN MOUTH AND THROAT WITH OR WITHOUT PRESENCE OF ULCERS  *URINARY PROBLEMS  *BOWEL PROBLEMS  UNUSUAL RASH Items with * indicate a potential emergency and should be followed up as soon as possible.  Feel free to call the clinic should you have any questions or concerns. The clinic phone number is [phone].  Please show the [facility] at check-in to the Emergency Department and triage nurse.  Influenza Virus Vaccine injection What is this medicine? INFLUENZA VIRUS VACCINE (in floo EN zuh VAHY ruhs vak SEEN) helps to reduce the risk of getting influenza also known as the flu. The vaccine only helps protect you against some strains of the flu. This medicine may be used for other purposes; ask your health care provider or pharmacist if you have questions. COMMON BRAND NAME(S): Afluria, Afluria Quadrivalent, Agriflu, Alfuria, FLUAD, Fluarix, Fluarix Quadrivalent, Flublok, Flublok Quadrivalent, FLUCELVAX, Flulaval, Fluvirin, Fluzone, Fluzone High-Dose, Fluzone Intradermal What should I tell my health care provider before I take this medicine? They need to know if you have any of these conditions:  bleeding disorder like hemophilia  fever or infection  Guillain-Barre syndrome or other neurological  problems  immune system problems  infection with the human immunodeficiency virus (HIV) or AIDS  low blood platelet counts  multiple sclerosis  an unusual or allergic reaction to influenza virus vaccine, latex, other medicines, foods, dyes, or preservatives. Different brands of vaccines contain different allergens. Some may contain latex or eggs. Talk to your doctor about your allergies to make sure that you get the right vaccine.  pregnant or trying to get pregnant  breast-feeding How should I use this medicine? This vaccine is for injection into a muscle or under the skin. It is given by a health care professional. A copy of Vaccine Information Statements will be given before each vaccination. Read this sheet carefully each time. The sheet may change frequently. Talk to your healthcare provider to see which vaccines are right for you. Some vaccines should not be used in all age groups. Overdosage: If you think you have taken too much of this medicine contact a poison control center or emergency room at once. NOTE: This medicine is only for you. Do not share this medicine with others. What if I miss a dose? This does not apply. What may interact with this medicine?  chemotherapy or radiation therapy  medicines that lower your immune system like etanercept, anakinra, infliximab, and adalimumab  medicines that treat or prevent blood clots like warfarin  phenytoin  steroid medicines like prednisone or cortisone  theophylline  vaccines This list may not describe all possible interactions. Give your health care provider a list of all the medicines, herbs, non-prescription drugs, or dietary supplements you use. Also tell them if you smoke, drink alcohol, or use illegal drugs. Some items may interact with your medicine. What should I watch  for while using this medicine? Report any side effects that do not go away within 3 days to your doctor or health care professional. Call your  health care provider if any unusual symptoms occur within 6 weeks of receiving this vaccine. You may still catch the flu, but the illness is not usually as bad. You cannot get the flu from the vaccine. The vaccine will not protect against colds or other illnesses that may cause fever. The vaccine is needed every year. What side effects may I notice from receiving this medicine? Side effects that you should report to your doctor or health care professional as soon as possible:  allergic reactions like skin rash, itching or hives, swelling of the face, lips, or tongue Side effects that usually do not require medical attention (report to your doctor or health care professional if they continue or are bothersome):  fever  headache  muscle aches and pains  pain, tenderness, redness, or swelling at the injection site  tiredness This list may not describe all possible side effects. Call your doctor for medical advice about side effects. You may report side effects to FDA at 1-800-FDA-1088. Where should I keep my medicine? The vaccine will be given by a health care professional in a clinic, pharmacy, doctor's office, or other health care setting. You will not be given vaccine doses to store at home. NOTE: This sheet is a summary. It may not cover all possible information. If you have questions about this medicine, talk to your doctor, pharmacist, or health care provider.  2020 Elsevier/Gold Standard (2018-06-06 08:45:43)  Coronavirus (COVID-19) Are you at risk?  Are you at risk for the Coronavirus (COVID-19)?  To be considered HIGH RISK for Coronavirus (COVID-19), you have to meet the following criteria:  . Traveled to [place], [place], [place], [place] or [place]; or in the [place] to [place], [place], [place], or [place]; and have fever, cough, and shortness of breath within the last 2 weeks of travel OR . Been in close contact with a person diagnosed with COVID-19 within the  last 2 weeks and have fever, cough, and shortness of breath . IF YOU DO NOT MEET THESE CRITERIA, YOU ARE CONSIDERED LOW RISK FOR COVID-19.  What to do if you are HIGH RISK for COVID-19?  Marland Kitchen If you are having a medical emergency, call 911. . Seek medical care right away. Before you go to a doctor's office, urgent care or emergency department, call ahead and tell them about your recent travel, contact with someone diagnosed with COVID-19, and your symptoms. You should receive instructions from your physician's office regarding next steps of care.  . When you arrive at healthcare provider, tell the healthcare staff immediately you have returned from visiting [place], [place], [place], [place] or [place]; or traveled in the [place] to [place], [place], [place], or [place]; in the last two weeks or you have been in close contact with a person diagnosed with COVID-19 in the last 2 weeks.   . Tell the health care staff about your symptoms: fever, cough and shortness of breath. . After you have been seen by a medical provider, you will be either: o Tested for (COVID-19) and discharged home on quarantine except to seek medical care if symptoms worsen, and asked to  - Stay home and avoid contact with others until you get your results (4-5 days)  - Avoid travel on public transportation if possible (such as bus, train, or airplane)  or o Sent to the Emergency Department by EMS for evaluation, COVID-19 testing, and possible admission depending on your condition and test results.  What to do if you are LOW RISK for COVID-19?  Reduce your risk of any infection by using the same precautions used for avoiding the common cold or flu:  Marland Kitchen Wash your hands often with soap and warm water for at least 20 seconds.  If soap and water are not readily available, use an alcohol-based hand sanitizer with at least 60% alcohol.  . If coughing or sneezing, cover your mouth and nose by coughing or sneezing into the elbow  areas of your shirt or coat, into a tissue or into your sleeve (not your hands). . Avoid shaking hands with others and consider head nods or verbal greetings only. . Avoid touching your eyes, nose, or mouth with unwashed hands.  . Avoid close contact with people who are sick. . Avoid places or events with large numbers of people in one location, like concerts or sporting events. . Carefully consider travel plans you have or are making. . If you are planning any travel outside or inside the [place], visit the CDC's Travelers' Health webpage for the latest health notices. . If you have some symptoms but not all symptoms, continue to monitor at home and seek medical attention if your symptoms worsen. . If you are having a medical emergency, call 911.   [place] [place] / e-Visit: [email or web address]         [facility]: [facility]: [id number]                   [facility]: [phone]

## 2019-04-13 ENCOUNTER — Other Ambulatory Visit: Payer: Self-pay

## 2019-04-13 ENCOUNTER — Ambulatory Visit
Admission: RE | Admit: 2019-04-13 | Discharge: 2019-04-13 | Disposition: A | Discharge status: Home / Self Care | Payer: BC Managed Care – PPO | Source: Ambulatory Visit | Attending: Radiation Oncology | Admitting: Radiation Oncology

## 2019-04-13 DIAGNOSIS — C50412 Malignant neoplasm of upper-outer quadrant of left female breast: Secondary | ICD-10-CM | POA: Diagnosis not present

## 2019-04-16 ENCOUNTER — Ambulatory Visit
Admission: RE | Admit: 2019-04-16 | Discharge: 2019-04-16 | Disposition: A | Discharge status: Home / Self Care | Payer: BC Managed Care – PPO | Source: Ambulatory Visit | Attending: Radiation Oncology | Admitting: Radiation Oncology

## 2019-04-16 ENCOUNTER — Other Ambulatory Visit: Payer: Self-pay

## 2019-04-16 DIAGNOSIS — C50412 Malignant neoplasm of upper-outer quadrant of left female breast: Secondary | ICD-10-CM | POA: Diagnosis not present

## 2019-04-17 ENCOUNTER — Other Ambulatory Visit: Payer: Self-pay

## 2019-04-17 ENCOUNTER — Ambulatory Visit
Admission: RE | Admit: 2019-04-17 | Discharge: 2019-04-17 | Disposition: A | Discharge status: Home / Self Care | Payer: BC Managed Care – PPO | Source: Ambulatory Visit | Attending: Radiation Oncology | Admitting: Radiation Oncology

## 2019-04-17 DIAGNOSIS — C50412 Malignant neoplasm of upper-outer quadrant of left female breast: Secondary | ICD-10-CM | POA: Diagnosis not present

## 2019-04-18 ENCOUNTER — Ambulatory Visit
Admission: RE | Admit: 2019-04-18 | Discharge: 2019-04-18 | Disposition: A | Discharge status: Home / Self Care | Payer: BC Managed Care – PPO | Source: Ambulatory Visit | Attending: Radiation Oncology | Admitting: Radiation Oncology

## 2019-04-18 ENCOUNTER — Other Ambulatory Visit: Payer: Self-pay

## 2019-04-18 DIAGNOSIS — C50412 Malignant neoplasm of upper-outer quadrant of left female breast: Secondary | ICD-10-CM | POA: Diagnosis not present

## 2019-04-19 ENCOUNTER — Other Ambulatory Visit: Payer: Self-pay

## 2019-04-19 ENCOUNTER — Ambulatory Visit
Admission: RE | Admit: 2019-04-19 | Discharge: 2019-04-19 | Disposition: A | Discharge status: Home / Self Care | Payer: BC Managed Care – PPO | Source: Ambulatory Visit | Attending: Radiation Oncology | Admitting: Radiation Oncology

## 2019-04-19 ENCOUNTER — Other Ambulatory Visit (HOSPITAL_COMMUNITY): Payer: BC Managed Care – PPO

## 2019-04-19 DIAGNOSIS — C50412 Malignant neoplasm of upper-outer quadrant of left female breast: Secondary | ICD-10-CM | POA: Diagnosis not present

## 2019-04-20 ENCOUNTER — Ambulatory Visit
Admission: RE | Admit: 2019-04-20 | Discharge: 2019-04-20 | Disposition: A | Discharge status: Home / Self Care | Payer: BC Managed Care – PPO | Source: Ambulatory Visit | Attending: Radiation Oncology | Admitting: Radiation Oncology

## 2019-04-20 ENCOUNTER — Other Ambulatory Visit: Payer: Self-pay

## 2019-04-20 DIAGNOSIS — C50412 Malignant neoplasm of upper-outer quadrant of left female breast: Secondary | ICD-10-CM | POA: Diagnosis not present

## 2019-04-23 ENCOUNTER — Other Ambulatory Visit: Payer: Self-pay

## 2019-04-23 ENCOUNTER — Ambulatory Visit
Admission: RE | Admit: 2019-04-23 | Discharge: 2019-04-23 | Disposition: A | Discharge status: Home / Self Care | Payer: BC Managed Care – PPO | Source: Ambulatory Visit | Attending: Radiation Oncology | Admitting: Radiation Oncology

## 2019-04-23 ENCOUNTER — Other Ambulatory Visit (HOSPITAL_COMMUNITY): Payer: BC Managed Care – PPO

## 2019-04-23 DIAGNOSIS — C50412 Malignant neoplasm of upper-outer quadrant of left female breast: Secondary | ICD-10-CM | POA: Diagnosis not present

## 2019-04-24 ENCOUNTER — Ambulatory Visit
Admission: RE | Admit: 2019-04-24 | Discharge: 2019-04-24 | Disposition: A | Discharge status: Home / Self Care | Payer: BC Managed Care – PPO | Source: Ambulatory Visit | Attending: Radiation Oncology | Admitting: Radiation Oncology

## 2019-04-24 ENCOUNTER — Ambulatory Visit (HOSPITAL_COMMUNITY): Payer: BC Managed Care – PPO | Attending: Cardiology

## 2019-04-24 DIAGNOSIS — Z170 Estrogen receptor positive status [ER+]: Secondary | ICD-10-CM | POA: Insufficient documentation

## 2019-04-24 DIAGNOSIS — C50912 Malignant neoplasm of unspecified site of left female breast: Secondary | ICD-10-CM | POA: Diagnosis present

## 2019-04-24 DIAGNOSIS — C50412 Malignant neoplasm of upper-outer quadrant of left female breast: Secondary | ICD-10-CM | POA: Insufficient documentation

## 2019-04-25 ENCOUNTER — Other Ambulatory Visit: Payer: Self-pay

## 2019-04-25 ENCOUNTER — Ambulatory Visit
Admission: RE | Admit: 2019-04-25 | Discharge: 2019-04-25 | Disposition: A | Discharge status: Home / Self Care | Payer: BC Managed Care – PPO | Source: Ambulatory Visit | Attending: Radiation Oncology | Admitting: Radiation Oncology

## 2019-04-25 DIAGNOSIS — C50412 Malignant neoplasm of upper-outer quadrant of left female breast: Secondary | ICD-10-CM | POA: Diagnosis not present

## 2019-04-25 NOTE — Progress Notes (Signed)
Insurance is requiring patient to change to [facility] for trastuzumab product. Orders updated to reflect this requirement.   Demetrius Charity, PharmD, [facility] Pharmacist Pharmacy Phone: [phone] 04/25/2019

## 2019-04-26 ENCOUNTER — Ambulatory Visit
Admission: RE | Admit: 2019-04-26 | Discharge: 2019-04-26 | Disposition: A | Discharge status: Home / Self Care | Payer: BC Managed Care – PPO | Source: Ambulatory Visit | Attending: Radiation Oncology | Admitting: Radiation Oncology

## 2019-04-26 ENCOUNTER — Other Ambulatory Visit: Payer: Self-pay

## 2019-04-26 ENCOUNTER — Telehealth: Payer: Self-pay | Admitting: Oncology

## 2019-04-26 DIAGNOSIS — Z510 Encounter for antineoplastic radiation therapy: Secondary | ICD-10-CM | POA: Diagnosis not present

## 2019-04-26 DIAGNOSIS — Z170 Estrogen receptor positive status [ER+]: Secondary | ICD-10-CM | POA: Diagnosis not present

## 2019-04-26 DIAGNOSIS — C50412 Malignant neoplasm of upper-outer quadrant of left female breast: Secondary | ICD-10-CM | POA: Insufficient documentation

## 2019-04-26 NOTE — Telephone Encounter (Signed)
[facility] 10/19 f/u moved from [facility] to [facility]. Other appointments remain the same. Left message for patient. Patient to get updated schedule at 10/7 visit.

## 2019-04-27 ENCOUNTER — Other Ambulatory Visit: Payer: Self-pay

## 2019-04-27 ENCOUNTER — Ambulatory Visit
Admission: RE | Admit: 2019-04-27 | Discharge: 2019-04-27 | Disposition: A | Discharge status: Home / Self Care | Payer: BC Managed Care – PPO | Source: Ambulatory Visit | Attending: Radiation Oncology | Admitting: Radiation Oncology

## 2019-04-27 DIAGNOSIS — C50412 Malignant neoplasm of upper-outer quadrant of left female breast: Secondary | ICD-10-CM | POA: Diagnosis not present

## 2019-04-30 ENCOUNTER — Other Ambulatory Visit: Payer: Self-pay

## 2019-04-30 ENCOUNTER — Ambulatory Visit
Admission: RE | Admit: 2019-04-30 | Discharge: 2019-04-30 | Disposition: A | Discharge status: Home / Self Care | Payer: BC Managed Care – PPO | Source: Ambulatory Visit | Attending: Radiation Oncology | Admitting: Radiation Oncology

## 2019-04-30 DIAGNOSIS — C50412 Malignant neoplasm of upper-outer quadrant of left female breast: Secondary | ICD-10-CM | POA: Diagnosis not present

## 2019-05-01 ENCOUNTER — Other Ambulatory Visit: Payer: Self-pay

## 2019-05-01 ENCOUNTER — Ambulatory Visit
Admission: RE | Admit: 2019-05-01 | Discharge: 2019-05-01 | Disposition: A | Discharge status: Home / Self Care | Payer: BC Managed Care – PPO | Source: Ambulatory Visit | Attending: Radiation Oncology | Admitting: Radiation Oncology

## 2019-05-01 ENCOUNTER — Encounter: Payer: Self-pay | Admitting: *Deleted

## 2019-05-01 DIAGNOSIS — C50412 Malignant neoplasm of upper-outer quadrant of left female breast: Secondary | ICD-10-CM | POA: Diagnosis not present

## 2019-05-02 ENCOUNTER — Other Ambulatory Visit: Payer: Self-pay

## 2019-05-02 ENCOUNTER — Inpatient Hospital Stay: Payer: BC Managed Care – PPO | Attending: Oncology

## 2019-05-02 ENCOUNTER — Inpatient Hospital Stay: Payer: BC Managed Care – PPO

## 2019-05-02 ENCOUNTER — Ambulatory Visit
Admission: RE | Admit: 2019-05-02 | Discharge: 2019-05-02 | Disposition: A | Discharge status: Home / Self Care | Payer: BC Managed Care – PPO | Source: Ambulatory Visit | Attending: Radiation Oncology | Admitting: Radiation Oncology

## 2019-05-02 VITALS — BP 123/78 | HR 67 | Temp 98.3°F | Resp 17

## 2019-05-02 DIAGNOSIS — C50412 Malignant neoplasm of upper-outer quadrant of left female breast: Secondary | ICD-10-CM | POA: Diagnosis not present

## 2019-05-02 DIAGNOSIS — Z79899 Other long term (current) drug therapy: Secondary | ICD-10-CM | POA: Diagnosis not present

## 2019-05-02 DIAGNOSIS — M8580 Other specified disorders of bone density and structure, unspecified site: Secondary | ICD-10-CM | POA: Diagnosis not present

## 2019-05-02 DIAGNOSIS — Z170 Estrogen receptor positive status [ER+]: Secondary | ICD-10-CM | POA: Insufficient documentation

## 2019-05-02 DIAGNOSIS — Z9012 Acquired absence of left breast and nipple: Secondary | ICD-10-CM | POA: Diagnosis not present

## 2019-05-02 DIAGNOSIS — Z5112 Encounter for antineoplastic immunotherapy: Secondary | ICD-10-CM | POA: Insufficient documentation

## 2019-05-02 DIAGNOSIS — Z95828 Presence of other vascular implants and grafts: Secondary | ICD-10-CM

## 2019-05-02 DIAGNOSIS — C50912 Malignant neoplasm of unspecified site of left female breast: Secondary | ICD-10-CM

## 2019-05-02 LAB — COMPREHENSIVE METABOLIC PANEL
ALT: 11 U/L (ref 0–44)
AST: 13 U/L — ABNORMAL LOW (ref 15–41)
Albumin: 3.7 g/dL (ref 3.5–5.0)
Alkaline Phosphatase: 81 U/L (ref 38–126)
Anion gap: 6 (ref 5–15)
BUN: 13 mg/dL (ref 6–20)
CO2: 27 mmol/L (ref 22–32)
Calcium: 9.2 mg/dL (ref 8.9–10.3)
Chloride: 106 mmol/L (ref 98–111)
Creatinine, Ser: 0.68 mg/dL (ref 0.44–1.00)
GFR calc Af Amer: >60 mL/min (ref >60)
GFR calc non Af Amer: >60 mL/min (ref >60)
Glucose, Bld: 110 mg/dL — ABNORMAL HIGH (ref 70–99)
Potassium: 3.7 mmol/L (ref 3.5–5.1)
Sodium: 139 mmol/L (ref 135–145)
Total Bilirubin: <0.2 mg/dL — ABNORMAL LOW (ref 0.3–1.2)
Total Protein: 6.8 g/dL (ref 6.5–8.1)

## 2019-05-02 LAB — CBC WITH DIFFERENTIAL/PLATELET
Abs Immature Granulocytes: 0.01 10*3/uL (ref 0.00–0.07)
Basophils Absolute: 0 10*3/uL (ref 0.0–0.1)
Basophils Relative: 0 %
Eosinophils Absolute: 0 10*3/uL (ref 0.0–0.5)
Eosinophils Relative: 1 %
HCT: 33.4 % — ABNORMAL LOW (ref 36.0–46.0)
Hemoglobin: 11.2 g/dL — ABNORMAL LOW (ref 12.0–15.0)
Immature Granulocytes: 0 %
Lymphocytes Relative: 23 %
Lymphs Abs: 1 10*3/uL (ref 0.7–4.0)
MCH: 31.6 pg (ref 26.0–34.0)
MCHC: 33.5 g/dL (ref 30.0–36.0)
MCV: 94.4 fL (ref 80.0–100.0)
Monocytes Absolute: 0.4 10*3/uL (ref 0.1–1.0)
Monocytes Relative: 8 %
Neutro Abs: 3 10*3/uL (ref 1.7–7.7)
Neutrophils Relative %: 68 %
Platelets: 220 10*3/uL (ref 150–400)
RBC: 3.54 MIL/uL — ABNORMAL LOW (ref 3.87–5.11)
RDW: 13 % (ref 11.5–15.5)
WBC: 4.4 10*3/uL (ref 4.0–10.5)
nRBC: 0 % (ref 0.0–0.2)

## 2019-05-02 MED ORDER — DIPHENHYDRAMINE HCL 25 MG PO CAPS
ORAL_CAPSULE | ORAL | Status: AC
  Filled 2019-05-02: qty 1

## 2019-05-02 MED ORDER — HEPARIN SOD (PORK) LOCK FLUSH 100 UNIT/ML IV SOLN
500.0000 [IU] | Freq: Once | INTRAVENOUS | Status: AC | PRN
  Administered 2019-05-02: 500 [IU]
  Filled 2019-05-02: qty 5

## 2019-05-02 MED ORDER — ACETAMINOPHEN 325 MG PO TABS
ORAL_TABLET | ORAL | Status: AC
  Filled 2019-05-02: qty 2

## 2019-05-02 MED ORDER — SODIUM CHLORIDE 0.9 % IV SOLN
420.0000 mg | Freq: Once | INTRAVENOUS | Status: AC
  Administered 2019-05-02: 420 mg via INTRAVENOUS
  Filled 2019-05-02: qty 14

## 2019-05-02 MED ORDER — SODIUM CHLORIDE 0.9% FLUSH
10.0000 mL | Freq: Once | INTRAVENOUS | Status: AC
  Administered 2019-05-02: 10 mL
  Filled 2019-05-02: qty 10

## 2019-05-02 MED ORDER — TRASTUZUMAB-ANNS CHEMO 150 MG IV SOLR
6.0000 mg/kg | Freq: Once | INTRAVENOUS | Status: AC
  Administered 2019-05-02: 14:00:00 399 mg via INTRAVENOUS
  Filled 2019-05-02: qty 19

## 2019-05-02 MED ORDER — SODIUM CHLORIDE 0.9 % IV SOLN
Freq: Once | INTRAVENOUS | Status: AC
  Administered 2019-05-02: 14:00:00 via INTRAVENOUS
  Filled 2019-05-02: qty 250

## 2019-05-02 MED ORDER — DIPHENHYDRAMINE HCL 25 MG PO CAPS
25.0000 mg | ORAL_CAPSULE | Freq: Once | ORAL | Status: AC
  Administered 2019-05-02: 14:00:00 25 mg via ORAL

## 2019-05-02 MED ORDER — ACETAMINOPHEN 325 MG PO TABS
650.0000 mg | ORAL_TABLET | Freq: Once | ORAL | Status: AC
  Administered 2019-05-02: 14:00:00 650 mg via ORAL

## 2019-05-02 MED ORDER — SODIUM CHLORIDE 0.9% FLUSH
10.0000 mL | INTRAVENOUS | Status: DC | PRN
  Administered 2019-05-02: 10 mL
  Filled 2019-05-02: qty 10

## 2019-05-02 NOTE — Patient Instructions (Signed)
[facility] Discharge Instructions for Patients Receiving Chemotherapy  Today you received the following chemotherapy agents: trastuzumab, perjeta.  To help prevent nausea and vomiting after your treatment, we encourage you to take your nausea medication as prescribed.   If you develop nausea and vomiting that is not controlled by your nausea medication, call the clinic.   BELOW ARE SYMPTOMS THAT SHOULD BE REPORTED IMMEDIATELY:  *FEVER GREATER THAN 100.5 F  *CHILLS WITH OR WITHOUT FEVER  NAUSEA AND VOMITING THAT IS NOT CONTROLLED WITH YOUR NAUSEA MEDICATION  *UNUSUAL SHORTNESS OF BREATH  *UNUSUAL BRUISING OR BLEEDING  TENDERNESS IN MOUTH AND THROAT WITH OR WITHOUT PRESENCE OF ULCERS  *URINARY PROBLEMS  *BOWEL PROBLEMS  UNUSUAL RASH Items with * indicate a potential emergency and should be followed up as soon as possible.  Feel free to call the clinic should you have any questions or concerns. The clinic phone number is [phone].  Please show the [facility] at check-in to the Emergency Department and triage nurse.

## 2019-05-03 ENCOUNTER — Ambulatory Visit: Payer: BC Managed Care – PPO

## 2019-05-03 ENCOUNTER — Ambulatory Visit
Admission: RE | Admit: 2019-05-03 | Discharge: 2019-05-03 | Disposition: A | Discharge status: Home / Self Care | Payer: BC Managed Care – PPO | Source: Ambulatory Visit | Attending: Radiation Oncology | Admitting: Radiation Oncology

## 2019-05-03 ENCOUNTER — Other Ambulatory Visit: Payer: BC Managed Care – PPO

## 2019-05-03 DIAGNOSIS — C50412 Malignant neoplasm of upper-outer quadrant of left female breast: Secondary | ICD-10-CM | POA: Diagnosis not present

## 2019-05-04 ENCOUNTER — Ambulatory Visit
Admission: RE | Admit: 2019-05-04 | Discharge: 2019-05-04 | Disposition: A | Discharge status: Home / Self Care | Payer: BC Managed Care – PPO | Source: Ambulatory Visit | Attending: Radiation Oncology | Admitting: Radiation Oncology

## 2019-05-04 DIAGNOSIS — C50412 Malignant neoplasm of upper-outer quadrant of left female breast: Secondary | ICD-10-CM | POA: Diagnosis not present

## 2019-05-07 ENCOUNTER — Other Ambulatory Visit: Payer: Self-pay

## 2019-05-07 ENCOUNTER — Ambulatory Visit
Admission: RE | Admit: 2019-05-07 | Discharge: 2019-05-07 | Disposition: A | Discharge status: Home / Self Care | Payer: BC Managed Care – PPO | Source: Ambulatory Visit | Attending: Radiation Oncology | Admitting: Radiation Oncology

## 2019-05-07 DIAGNOSIS — C50412 Malignant neoplasm of upper-outer quadrant of left female breast: Secondary | ICD-10-CM | POA: Diagnosis not present

## 2019-05-08 ENCOUNTER — Ambulatory Visit
Admission: RE | Admit: 2019-05-08 | Discharge: 2019-05-08 | Disposition: A | Discharge status: Home / Self Care | Payer: BC Managed Care – PPO | Source: Ambulatory Visit | Attending: Radiation Oncology | Admitting: Radiation Oncology

## 2019-05-08 ENCOUNTER — Other Ambulatory Visit: Payer: Self-pay

## 2019-05-08 DIAGNOSIS — C50412 Malignant neoplasm of upper-outer quadrant of left female breast: Secondary | ICD-10-CM | POA: Diagnosis not present

## 2019-05-09 ENCOUNTER — Other Ambulatory Visit: Payer: Self-pay

## 2019-05-09 ENCOUNTER — Ambulatory Visit
Admission: RE | Admit: 2019-05-09 | Discharge: 2019-05-09 | Disposition: A | Discharge status: Home / Self Care | Payer: BC Managed Care – PPO | Source: Ambulatory Visit | Attending: Radiation Oncology | Admitting: Radiation Oncology

## 2019-05-09 DIAGNOSIS — C50412 Malignant neoplasm of upper-outer quadrant of left female breast: Secondary | ICD-10-CM | POA: Diagnosis not present

## 2019-05-10 ENCOUNTER — Other Ambulatory Visit: Payer: Self-pay

## 2019-05-10 ENCOUNTER — Ambulatory Visit
Admission: RE | Admit: 2019-05-10 | Discharge: 2019-05-10 | Disposition: A | Discharge status: Home / Self Care | Payer: BC Managed Care – PPO | Source: Ambulatory Visit | Attending: Radiation Oncology | Admitting: Radiation Oncology

## 2019-05-10 DIAGNOSIS — C50412 Malignant neoplasm of upper-outer quadrant of left female breast: Secondary | ICD-10-CM | POA: Diagnosis not present

## 2019-05-11 ENCOUNTER — Other Ambulatory Visit: Payer: Self-pay

## 2019-05-11 ENCOUNTER — Ambulatory Visit
Admission: RE | Admit: 2019-05-11 | Discharge: 2019-05-11 | Disposition: A | Discharge status: Home / Self Care | Payer: BC Managed Care – PPO | Source: Ambulatory Visit | Attending: Radiation Oncology | Admitting: Radiation Oncology

## 2019-05-11 DIAGNOSIS — C50412 Malignant neoplasm of upper-outer quadrant of left female breast: Secondary | ICD-10-CM | POA: Diagnosis not present

## 2019-05-14 ENCOUNTER — Ambulatory Visit
Admission: RE | Admit: 2019-05-14 | Discharge: 2019-05-14 | Disposition: A | Discharge status: Home / Self Care | Payer: BC Managed Care – PPO | Source: Ambulatory Visit | Attending: Radiation Oncology | Admitting: Radiation Oncology

## 2019-05-14 ENCOUNTER — Ambulatory Visit: Payer: BC Managed Care – PPO | Admitting: Radiation Oncology

## 2019-05-14 DIAGNOSIS — C50412 Malignant neoplasm of upper-outer quadrant of left female breast: Secondary | ICD-10-CM | POA: Diagnosis not present

## 2019-05-15 ENCOUNTER — Other Ambulatory Visit: Payer: Self-pay

## 2019-05-15 ENCOUNTER — Ambulatory Visit
Admission: RE | Admit: 2019-05-15 | Discharge: 2019-05-15 | Disposition: A | Discharge status: Home / Self Care | Payer: BC Managed Care – PPO | Source: Ambulatory Visit | Attending: Radiation Oncology | Admitting: Radiation Oncology

## 2019-05-15 DIAGNOSIS — C50412 Malignant neoplasm of upper-outer quadrant of left female breast: Secondary | ICD-10-CM | POA: Diagnosis not present

## 2019-05-16 ENCOUNTER — Other Ambulatory Visit: Payer: Self-pay

## 2019-05-16 ENCOUNTER — Ambulatory Visit
Admission: RE | Admit: 2019-05-16 | Discharge: 2019-05-16 | Disposition: A | Discharge status: Home / Self Care | Payer: BC Managed Care – PPO | Source: Ambulatory Visit | Attending: Radiation Oncology | Admitting: Radiation Oncology

## 2019-05-16 DIAGNOSIS — C50412 Malignant neoplasm of upper-outer quadrant of left female breast: Secondary | ICD-10-CM | POA: Diagnosis not present

## 2019-05-17 ENCOUNTER — Ambulatory Visit
Admission: RE | Admit: 2019-05-17 | Discharge: 2019-05-17 | Disposition: A | Discharge status: Home / Self Care | Payer: BC Managed Care – PPO | Source: Ambulatory Visit | Attending: Radiation Oncology | Admitting: Radiation Oncology

## 2019-05-17 ENCOUNTER — Telehealth: Payer: Self-pay | Admitting: Oncology

## 2019-05-17 ENCOUNTER — Other Ambulatory Visit: Payer: Self-pay

## 2019-05-17 DIAGNOSIS — C50412 Malignant neoplasm of upper-outer quadrant of left female breast: Secondary | ICD-10-CM | POA: Diagnosis not present

## 2019-05-17 NOTE — Telephone Encounter (Signed)
Returned patient's phone call regarding rescheduling an appointment, left a voicemail. 

## 2019-05-18 ENCOUNTER — Other Ambulatory Visit: Payer: Self-pay

## 2019-05-18 ENCOUNTER — Ambulatory Visit
Admission: RE | Admit: 2019-05-18 | Discharge: 2019-05-18 | Disposition: A | Discharge status: Home / Self Care | Payer: BC Managed Care – PPO | Source: Ambulatory Visit | Attending: Radiation Oncology | Admitting: Radiation Oncology

## 2019-05-18 DIAGNOSIS — C50412 Malignant neoplasm of upper-outer quadrant of left female breast: Secondary | ICD-10-CM | POA: Diagnosis not present

## 2019-05-21 ENCOUNTER — Telehealth: Payer: Self-pay | Admitting: Oncology

## 2019-05-21 ENCOUNTER — Ambulatory Visit
Admission: RE | Admit: 2019-05-21 | Discharge: 2019-05-21 | Disposition: A | Discharge status: Home / Self Care | Payer: BC Managed Care – PPO | Source: Ambulatory Visit | Attending: Radiation Oncology | Admitting: Radiation Oncology

## 2019-05-21 ENCOUNTER — Other Ambulatory Visit: Payer: Self-pay

## 2019-05-21 ENCOUNTER — Ambulatory Visit: Payer: BC Managed Care – PPO | Admitting: Radiation Oncology

## 2019-05-21 DIAGNOSIS — C50412 Malignant neoplasm of upper-outer quadrant of left female breast: Secondary | ICD-10-CM | POA: Diagnosis not present

## 2019-05-21 NOTE — Telephone Encounter (Signed)
Returned patient's phone call regarding rescheduling an appointment, left a voicemail. 

## 2019-05-22 ENCOUNTER — Other Ambulatory Visit: Payer: Self-pay

## 2019-05-22 ENCOUNTER — Ambulatory Visit
Admission: RE | Admit: 2019-05-22 | Discharge: 2019-05-22 | Disposition: A | Discharge status: Home / Self Care | Payer: BC Managed Care – PPO | Source: Ambulatory Visit | Attending: Radiation Oncology | Admitting: Radiation Oncology

## 2019-05-22 ENCOUNTER — Other Ambulatory Visit: Payer: Self-pay | Admitting: Nurse Practitioner

## 2019-05-22 DIAGNOSIS — I10 Essential (primary) hypertension: Secondary | ICD-10-CM

## 2019-05-22 DIAGNOSIS — C50412 Malignant neoplasm of upper-outer quadrant of left female breast: Secondary | ICD-10-CM | POA: Diagnosis not present

## 2019-05-22 NOTE — Telephone Encounter (Signed)
LVM for the pt to call back, need to offer CPE with [facility],--last ov was 08/2018.

## 2019-05-23 ENCOUNTER — Other Ambulatory Visit: Payer: Self-pay

## 2019-05-23 ENCOUNTER — Ambulatory Visit
Admission: RE | Admit: 2019-05-23 | Discharge: 2019-05-23 | Disposition: A | Discharge status: Home / Self Care | Payer: BC Managed Care – PPO | Source: Ambulatory Visit | Attending: Radiation Oncology | Admitting: Radiation Oncology

## 2019-05-23 DIAGNOSIS — C50412 Malignant neoplasm of upper-outer quadrant of left female breast: Secondary | ICD-10-CM | POA: Diagnosis not present

## 2019-05-24 ENCOUNTER — Encounter: Payer: Self-pay | Admitting: Hematology and Oncology

## 2019-05-24 ENCOUNTER — Encounter: Payer: Self-pay | Admitting: Adult Health

## 2019-05-24 ENCOUNTER — Other Ambulatory Visit: Payer: Self-pay

## 2019-05-24 ENCOUNTER — Inpatient Hospital Stay: Payer: BC Managed Care – PPO

## 2019-05-24 ENCOUNTER — Inpatient Hospital Stay (HOSPITAL_BASED_OUTPATIENT_CLINIC_OR_DEPARTMENT_OTHER): Payer: BC Managed Care – PPO | Admitting: Adult Health

## 2019-05-24 ENCOUNTER — Ambulatory Visit
Admission: RE | Admit: 2019-05-24 | Discharge: 2019-05-24 | Disposition: A | Discharge status: Home / Self Care | Payer: BC Managed Care – PPO | Source: Ambulatory Visit | Attending: Radiation Oncology | Admitting: Radiation Oncology

## 2019-05-24 ENCOUNTER — Telehealth: Payer: Self-pay | Admitting: Adult Health

## 2019-05-24 VITALS — BP 122/78 | HR 65 | Temp 98.7°F | Resp 18 | Ht 64.0 in | Wt 131.5 lb

## 2019-05-24 VITALS — BP 107/79 | HR 64 | Temp 98.5°F | Resp 18

## 2019-05-24 DIAGNOSIS — C50412 Malignant neoplasm of upper-outer quadrant of left female breast: Secondary | ICD-10-CM

## 2019-05-24 DIAGNOSIS — Z170 Estrogen receptor positive status [ER+]: Secondary | ICD-10-CM

## 2019-05-24 DIAGNOSIS — Z95828 Presence of other vascular implants and grafts: Secondary | ICD-10-CM

## 2019-05-24 DIAGNOSIS — Z5112 Encounter for antineoplastic immunotherapy: Secondary | ICD-10-CM | POA: Diagnosis not present

## 2019-05-24 DIAGNOSIS — C50912 Malignant neoplasm of unspecified site of left female breast: Secondary | ICD-10-CM

## 2019-05-24 LAB — CBC WITH DIFFERENTIAL/PLATELET
Abs Immature Granulocytes: 0.01 10*3/uL (ref 0.00–0.07)
Basophils Absolute: 0 10*3/uL (ref 0.0–0.1)
Basophils Relative: 0 %
Eosinophils Absolute: 0 10*3/uL (ref 0.0–0.5)
Eosinophils Relative: 1 %
HCT: 34 % — ABNORMAL LOW (ref 36.0–46.0)
Hemoglobin: 11.6 g/dL — ABNORMAL LOW (ref 12.0–15.0)
Immature Granulocytes: 0 %
Lymphocytes Relative: 10 %
Lymphs Abs: 0.5 10*3/uL — ABNORMAL LOW (ref 0.7–4.0)
MCH: 31.7 pg (ref 26.0–34.0)
MCHC: 34.1 g/dL (ref 30.0–36.0)
MCV: 92.9 fL (ref 80.0–100.0)
Monocytes Absolute: 0.4 10*3/uL (ref 0.1–1.0)
Monocytes Relative: 8 %
Neutro Abs: 3.7 10*3/uL (ref 1.7–7.7)
Neutrophils Relative %: 81 %
Platelets: 220 10*3/uL (ref 150–400)
RBC: 3.66 MIL/uL — ABNORMAL LOW (ref 3.87–5.11)
RDW: 12.9 % (ref 11.5–15.5)
WBC: 4.6 10*3/uL (ref 4.0–10.5)
nRBC: 0 % (ref 0.0–0.2)

## 2019-05-24 LAB — COMPREHENSIVE METABOLIC PANEL
ALT: 11 U/L (ref 0–44)
AST: 14 U/L — ABNORMAL LOW (ref 15–41)
Albumin: 3.8 g/dL (ref 3.5–5.0)
Alkaline Phosphatase: 77 U/L (ref 38–126)
Anion gap: 8 (ref 5–15)
BUN: 9 mg/dL (ref 6–20)
CO2: 25 mmol/L (ref 22–32)
Calcium: 9.3 mg/dL (ref 8.9–10.3)
Chloride: 106 mmol/L (ref 98–111)
Creatinine, Ser: 0.72 mg/dL (ref 0.44–1.00)
GFR calc Af Amer: >60 mL/min (ref >60)
GFR calc non Af Amer: >60 mL/min (ref >60)
Glucose, Bld: 105 mg/dL — ABNORMAL HIGH (ref 70–99)
Potassium: 4 mmol/L (ref 3.5–5.1)
Sodium: 139 mmol/L (ref 135–145)
Total Bilirubin: <0.2 mg/dL — ABNORMAL LOW (ref 0.3–1.2)
Total Protein: 6.9 g/dL (ref 6.5–8.1)

## 2019-05-24 MED ORDER — ACETAMINOPHEN 325 MG PO TABS
ORAL_TABLET | ORAL | Status: AC
  Filled 2019-05-24: qty 2

## 2019-05-24 MED ORDER — TRASTUZUMAB-ANNS CHEMO 150 MG IV SOLR: 6.0000 mg/kg | Freq: Once | INTRAVENOUS | Status: DC

## 2019-05-24 MED ORDER — DIPHENHYDRAMINE HCL 25 MG PO CAPS
25.0000 mg | ORAL_CAPSULE | Freq: Once | ORAL | Status: AC
  Administered 2019-05-24: 25 mg via ORAL

## 2019-05-24 MED ORDER — SODIUM CHLORIDE 0.9 % IV SOLN
Freq: Once | INTRAVENOUS | Status: AC
  Administered 2019-05-24: 10:00:00 via INTRAVENOUS
  Filled 2019-05-24: qty 250

## 2019-05-24 MED ORDER — SODIUM CHLORIDE 0.9% FLUSH
10.0000 mL | Freq: Once | INTRAVENOUS | Status: AC
  Administered 2019-05-24: 10 mL
  Filled 2019-05-24: qty 10

## 2019-05-24 MED ORDER — SODIUM CHLORIDE 0.9 % IV SOLN
420.0000 mg | Freq: Once | INTRAVENOUS | Status: AC
  Administered 2019-05-24: 420 mg via INTRAVENOUS
  Filled 2019-05-24: qty 14

## 2019-05-24 MED ORDER — ACETAMINOPHEN 325 MG PO TABS
650.0000 mg | ORAL_TABLET | Freq: Once | ORAL | Status: AC
  Administered 2019-05-24: 650 mg via ORAL

## 2019-05-24 MED ORDER — TRASTUZUMAB-ANNS CHEMO 150 MG IV SOLR
6.0000 mg/kg | Freq: Once | INTRAVENOUS | Status: AC
  Administered 2019-05-24: 357 mg via INTRAVENOUS
  Filled 2019-05-24: qty 17

## 2019-05-24 MED ORDER — RADIAPLEXRX EX GEL
Freq: Once | CUTANEOUS | Status: AC
  Administered 2019-05-24: 10:00:00 via TOPICAL

## 2019-05-24 MED ORDER — SODIUM CHLORIDE 0.9% FLUSH
10.0000 mL | INTRAVENOUS | Status: DC | PRN
  Administered 2019-05-24: 10 mL
  Filled 2019-05-24: qty 10

## 2019-05-24 MED ORDER — DIPHENHYDRAMINE HCL 25 MG PO CAPS
ORAL_CAPSULE | ORAL | Status: AC
  Filled 2019-05-24: qty 2

## 2019-05-24 MED ORDER — HEPARIN SOD (PORK) LOCK FLUSH 100 UNIT/ML IV SOLN
500.0000 [IU] | Freq: Once | INTRAVENOUS | Status: AC | PRN
  Administered 2019-05-24: 500 [IU]
  Filled 2019-05-24: qty 5

## 2019-05-24 NOTE — Patient Instructions (Signed)
Anastrozole tablets What is this medicine? ANASTROZOLE (an AS troe zole) is used to treat breast cancer in women who have gone through menopause. Some types of breast cancer depend on estrogen to grow, and this medicine can stop tumor growth by blocking estrogen production. This medicine may be used for other purposes; ask your health care provider or pharmacist if you have questions. COMMON BRAND NAME(S): Arimidex What should I tell my health care provider before I take this medicine? They need to know if you have any of these conditions:  bone problems  heart disease  high cholesterol  an unusual or allergic reaction to anastrozole, other medicines, foods, dyes, or preservatives  pregnant or trying to get pregnant  breast-feeding How should I use this medicine? Take this medicine by mouth with a glass of water. Follow the directions on the prescription label. You can take it with or without food. If it upsets your stomach, take it with food. Take your medicine at regular intervals. Do not take it more often than directed. Do not stop taking except on your doctor's advice. Talk to your pediatrician regarding the use of this medicine in children. Special care may be needed. Overdosage: If you think you have taken too much of this medicine contact a poison control center or emergency room at once. NOTE: This medicine is only for you. Do not share this medicine with others. What if I miss a dose? If you miss a dose, take it as soon as you can. If it is almost time for your next dose, take only that dose. Do not take double or extra doses. What may interact with this medicine? This medicine may interact with the following medications:  female hormones, like estrogens or progestins and birth control pills, patches, rings, or injections  tamoxifen This list may not describe all possible interactions. Give your health care provider a list of all the medicines, herbs, non-prescription drugs,  or dietary supplements you use. Also tell them if you smoke, drink alcohol, or use illegal drugs. Some items may interact with your medicine. What should I watch for while using this medicine? Visit your doctor or health care professional for regular checks on your progress. Let your doctor or health care professional know about any unusual vaginal bleeding. Do not become pregnant while taking this medicine or for at least 3 weeks after stopping it. Women should inform their doctor if they wish to become pregnant or think they might be pregnant. There is a potential for serious side effects to an unborn child. Talk to your health care professional or pharmacist for more information. Do not breast-feed an infant while taking this medicine or for 2 weeks after stopping it. This medicine may interfere with the ability to have a child. Talk with your doctor or health care professional if you are concerned about your fertility. Using this medicine for a long time may increase your risk of low bone mass. Talk to your doctor about bone health. You should make sure that you get enough calcium and vitamin D while you are taking this medicine. Discuss the foods you eat and the vitamins you take with your health care professional. What side effects may I notice from receiving this medicine? Side effects that you should report to your doctor or health care professional as soon as possible:  allergic reactions like skin rash, itching or hives, swelling of the face, lips, or tongue  signs and symptoms of a blood clot such as breathing problems;   changes in vision; chest pain; sudden headache; pain, swelling, warmth in the leg; trouble speaking; sudden numbness or weakness of the face, arm, or leg  signs and symptoms of infection like fever or chills; cough; sore throat; pain or trouble passing urine Side effects that usually do not require medical attention (report to your doctor or health care professional if they  continue or are bothersome):  bone pain  dizziness  hair loss  headache  hot flashes  joint pain  muscle pain  signs of decreased red blood cells - unusually weak or tired, feeling faint or lightheaded, falls  vaginal discharge, itching, or odor in women This list may not describe all possible side effects. Call your doctor for medical advice about side effects. You may report side effects to FDA at 1-800-FDA-1088. Where should I keep my medicine? Keep out of the reach of children. Store at room temperature between 20 and 25 degrees C (68 and 77 degrees F). Throw away any unused medicine after the expiration date. NOTE: This sheet is a summary. It may not cover all possible information. If you have questions about this medicine, talk to your doctor, pharmacist, or health care provider.  2020 Elsevier/Gold Standard (2017-07-25 14:56:51) Tamoxifen oral tablet What is this medicine? TAMOXIFEN (ta MOX i fen) blocks the effects of estrogen. It is commonly used to treat breast cancer. It is also used to decrease the chance of breast cancer coming back in women who have received treatment for the disease. It may also help prevent breast cancer in women who have a high risk of developing breast cancer. This medicine may be used for other purposes; ask your health care provider or pharmacist if you have questions. COMMON BRAND NAME(S): Nolvadex What should I tell my health care provider before I take this medicine? They need to know if you have any of these conditions:  blood clots  blood disease  cataracts or impaired eyesight  endometriosis  high calcium levels  high cholesterol  irregular menstrual cycles  liver disease  stroke  uterine fibroids  an unusual reaction to tamoxifen, other medicines, foods, dyes, or preservatives  pregnant or trying to get pregnant  breast-feeding How should I use this medicine? Take this medicine by mouth with a glass of water.  Follow the directions on the prescription label. You can take it with or without food. Take your medicine at regular intervals. Do not take your medicine more often than directed. Do not stop taking except on your doctor's advice. A special MedGuide will be given to you by the pharmacist with each prescription and refill. Be sure to read this information carefully each time. Talk to your pediatrician regarding the use of this medicine in children. While this drug may be prescribed for selected conditions, precautions do apply. Overdosage: If you think you have taken too much of this medicine contact a poison control center or emergency room at once. NOTE: This medicine is only for you. Do not share this medicine with others. What if I miss a dose? If you miss a dose, take it as soon as you can. If it is almost time for your next dose, take only that dose. Do not take double or extra doses. What may interact with this medicine? Do not take this medicine with any of the following medications:  cisapride  certain medicines for irregular heart beat like dronedarone, quinidine  certain medicines for fungal infection like fluconazole, posaconazole  pimozide  saquinavir  thioridazine This medicine  may also interact with the following medications:  aminoglutethimide  anastrozole  bromocriptine  chemotherapy drugs  dofetilide  female hormones, like estrogens and birth control pills  letrozole  medroxyprogesterone  phenobarbital  rifampin  warfarin This list may not describe all possible interactions. Give your health care provider a list of all the medicines, herbs, non-prescription drugs, or dietary supplements you use. Also tell them if you smoke, drink alcohol, or use illegal drugs. Some items may interact with your medicine. What should I watch for while using this medicine? Visit your doctor or health care professional for regular checks on your progress. You will need  regular pelvic exams, breast exams, and mammograms. If you are taking this medicine to reduce your risk of getting breast cancer, you should know that this medicine does not prevent all types of breast cancer. If breast cancer or other problems occur, there is no guarantee that it will be found at an early stage. Do not become pregnant while taking this medicine or for 2 months after stopping it. Women should inform their doctor if they wish to become pregnant or think they might be pregnant. There is a potential for serious side effects to an unborn child. Talk to your health care professional or pharmacist for more information. Do not breast-feed an infant while taking this medicine or for 3 months after stopping it. This medicine may interfere with the ability to have a child. Talk with your doctor or health care professional if you are concerned about your fertility. What side effects may I notice from receiving this medicine? Side effects that you should report to your doctor or health care professional as soon as possible:  allergic reactions like skin rash, itching or hives, swelling of the face, lips, or tongue  changes in vision  changes in your menstrual cycle  difficulty walking or talking  new breast lumps  numbness  pelvic pain or pressure  redness, blistering, peeling or loosening of the skin, including inside the mouth  signs and symptoms of a dangerous change in heartbeat or heart rhythm like chest pain, dizziness, fast or irregular heartbeat, palpitations, feeling faint or lightheaded, falls, breathing problems  sudden chest pain  swelling, pain or tenderness in your calf or leg  unusual bruising or bleeding  vaginal discharge that is bloody, brown, or rust  weakness  yellowing of the whites of the eyes or skin Side effects that usually do not require medical attention (report to your doctor or health care professional if they continue or are  bothersome):  fatigue  hair loss, although uncommon and is usually mild  headache  hot flashes  impotence (in men)  nausea, vomiting (mild)  vaginal discharge (white or clear) This list may not describe all possible side effects. Call your doctor for medical advice about side effects. You may report side effects to FDA at 1-800-FDA-1088. Where should I keep my medicine? Keep out of the reach of children. Store at room temperature between 20 and 25 degrees C (68 and 77 degrees F). Protect from light. Keep container tightly closed. Throw away any unused medicine after the expiration date. NOTE: This sheet is a summary. It may not cover all possible information. If you have questions about this medicine, talk to your doctor, pharmacist, or health care provider.  2020 Elsevier/Gold Standard (2018-07-04 11:15:31)

## 2019-05-24 NOTE — Telephone Encounter (Signed)
Scheduled appt per  10/29 los - pt aware of appt date and time

## 2019-05-24 NOTE — Progress Notes (Signed)
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  She continues on radiation therapy and completes this on 05/28/2019 (Monday).    Her last bone density was in 11/2015 and showed osteopenia with a  t score of -1.6.  She receives Trastuzumab/Pertuzumab every 3 weeks and tolerates this well.  She says she has no diarrhea, and perhaps has some mild constipation.    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  She is working 8 hours per day 5-6 days per week.  She is very tired after work, but she is pushing through it.  She says she stopped having menstrual cycles a long time ago--about 10 years.  She says that she has only had a tubal ligation, and has not undergone any other uterine ablation, hysterectomy or other GYN procedure.     [name] has no fever or chills.  She is without cough, shortness of breath, chest pain, or palpitations.  She has no nausea, vomiting, bowel/bladder changes.  Her LBM was today.  A detailed ROS was otherwise non contributory.     HISTORY OF CURRENT  ILLNESS: From the original intake note:  Paige Foster has a prior history of left breast cancer, dating back to 2004. At that time she underwent a left mastectomy for stage 0 (noninvasive) breast cancer, with transverse rectus abdominis (TRAM) flap construction under Dr. Towanda Malkin. She also underwent a right breast reduction. She took tamoxifen for three years.  More recently she underwent bilateral diagnostic mammography with tomography and left breast ultrasonography at [facility] on 01/03/2018 showing: Breast Density Category B. There is an oval fat containing lesion in the left breast upper outer quadrant posterior depth. No other significant masses, calcifications, or other findings are seen in either breast. Sonographically, there is a 1.5 cm lesion in the left breast upper outer quadrant posterior depth. This lesion is of mixed echogenicity. This correlates as palpated and with mammography findings. Follow up was recommended.  Close follow-up was suggested.  She then presented with a non-tender mass in the left reconstructed breast on 08/30/2018. On physical exam, there is a hard palpable lump measuring 2.0 cm in the upper outer left reconstructed breast 10 cm from the expected location of a nipple. Sonography over this area demonstrates a 1.8 cm x 1.7 cm x 1.4 cm mass in the left breast at 2 o'clock posterior depth 10 cm from the nipple. This mass is of mixed echogenicity. This abnormality is increased in size and correlates as palpated and with prior mammography findings. Color flow imaging demonstrates that there is vascularity present. Elastography imaging assessment is intermediate. No significant abnormalities were seen sonographically in the left axilla.    Accordingly on 08/30/2018 she proceeded  to biopsy of the left breast mass in question. The pathology from this procedure showed ([id number]): invasive ductal carcinoma, grade III. Prognostic indicators significant for: estrogen receptor, 100%  positive with strong staining intensity and progesterone receptor, 0% negative. Proliferation marker Ki67 at 15%. HER2 positive (3+) by immunohistochemistry.  The patient's subsequent history is as detailed below.   PAST MEDICAL HISTORY: Past Medical History:  Diagnosis Date   Anemia    History of blood transfusion 2004   History of colon polyps    Hypertension    Recurrent breast cancer, left [facility]) oncologist-- dr Jana Hakim    dx 2004, noninvasive Stage 0 ----s/p left mastectomy w/ tram flap construction (and right breast reduction), taken Tamoxifen for 3 yrs;   08-30-2018 recurrent left cancer , Grade III,  cT1c,  ER positive, PR negative, HER-2 positive, invasive ductal carcinoma-- neoadjuvant chemo to start 09-26-2018   Renal artery stenosis (HCC)    mild right external renal artery stenosis per duplex in epic 08-09-2013   Wears glasses     PAST SURGICAL HISTORY: Past Surgical History:  Procedure Laterality Date   BREAST LUMPECTOMY WITH RADIOACTIVE SEED LOCALIZATION Left 02/20/2019   Procedure: LEFT BREAST LUMPECTOMY WITH RADIOACTIVE SEED LOCALIZATION;  Surgeon: Rolm Bookbinder, MD;  Location: [facility];  Service: General;  Laterality: Left;   BREAST SURGERY Left    Transflap   COLONOSCOPY     MASTECTOMY Left 2004   w/  TRAM flap construction and right breast augmentation with abdominoplasy   PARS PLANA VITRECTOMY Left 12/18/2018   Procedure: PARS PLANA VITRECTOMY WITH 25 GAUGE, ENDOLASER;  Surgeon: Jalene Mullet, MD;  Location: [facility];  Service: Ophthalmology;  Laterality: Left;   PORTACATH PLACEMENT N/A 09/25/2018   Procedure: INSERTION PORT-A-CATH WITH ULTRASOUND;  Surgeon: Rolm Bookbinder, MD;  Location: [facility];  Service: General;  Laterality: N/A;   TUBAL LIGATION Bilateral yrs ago     FAMILY HISTORY: Family History  Problem Relation Age of Onset   Diabetes Mother    Hypertension Mother    Kidney disease Father    Colon cancer Neg Hx    Colon  polyps Neg Hx    Gallbladder disease Neg Hx    Heart disease Neg Hx    Esophageal cancer Neg Hx    Paige Foster's father died from unknown causes in his early 52's. Patients' mother died from diabetes complications at age 92. The patient has 1 sister. Patient denies anyone in her family having breast, ovarian, prostate, or pancreatic cancer.    GYNECOLOGIC HISTORY:  Patient's last menstrual period was 06/08/2007. 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 Her husband, Elwin Mocha, works at [employer]. Manda has two children, Vonna Kotyk and Shanon Brow. Vonna Kotyk lives with her, is 17, and it attending [employer] for a computer based degree. Shanon Brow lives with her, is 68, and recently graduated from [employer] with a degree in Careers information officer. Julianne has no grandchildren. She attends the [employer].   ADVANCED DIRECTIVES: Her husband, Elwin Mocha, is automatically her healthcare power of attorney     HEALTH MAINTENANCE: Social History   Tobacco Use   Smoking status: Never Smoker   Smokeless tobacco: Never Used  Substance Use Topics   Alcohol use: Not Currently    Alcohol/week: 0.0 standard drinks    Comment: Occassionally  Drug use: No    Colonoscopy: yes  PAP:   Bone density: yes, 2017; -1.6, osteopenic   No Known Allergies  Current Outpatient Medications  Medication Sig Dispense Refill   amLODipine (NORVASC) 10 MG tablet Take 1 tablet (10 mg total) by mouth daily. Need office visit for additional refills 90 tablet 3   Ascorbic Acid (VITAMIN C) 1000 MG tablet Take 1,000 mg by mouth daily.     Flaxseed, Linseed, (FLAXSEED OIL) 1000 MG CAPS Take 1,000 mg by mouth daily.     LORazepam (ATIVAN) 0.5 MG tablet Take 1 tablet (0.5 mg total) by mouth every 8 (eight) hours as needed for anxiety or sleep. 30 tablet 0    losartan (COZAAR) 50 MG tablet Take 1 tablet (50 mg total) by mouth daily. Need office visit for additional refills 90 tablet 1   Polyethyl Glycol-Propyl Glycol (SYSTANE) 0.4-0.3 % SOLN Place 1 drop into both eyes 4 (four) times daily as needed (for dry eyes).     prednisoLONE acetate (PRED FORTE) 1 % ophthalmic suspension Place 1 drop into the left eye 4 (four) times daily.     No current facility-administered medications for this visit.      OBJECTIVE: Middle-aged African-American woman in no acute distress  Vitals:   05/24/19 0924  BP: 122/78  Pulse: 65  Resp: 18  Temp: 98.7 F (37.1 C)  SpO2: 100%     Body mass index is 22.57 kg/m.   Wt Readings from Last 3 Encounters:  05/24/19 131 lb 8 oz (59.6 kg)  04/12/19 131 lb 1.6 oz (59.5 kg)  03/22/19 132 lb 8 oz (60.1 kg)  ECOG FS:1 GENERAL: Patient is a well appearing female in no acute distress HEENT:  Sclerae anicteric.  Oropharynx clear and moist. No ulcerations or evidence of oropharyngeal candidiasis. Neck is supple.  NODES:  No cervical, supraclavicular, or axillary lymphadenopathy palpated.  BREAST EXAM:  Inspected only, left breast s/p lumpectomy, undergoing adjuvant radiation with dark hyperpigmentation and dry desquamation present LUNGS:  Clear to auscultation bilaterally.  No wheezes or rhonchi. HEART:  Regular rate and rhythm. No murmur appreciated. ABDOMEN:  Soft, nontender.  Positive, normoactive bowel sounds. No organomegaly palpated. MSK:  No focal spinal tenderness to palpation.  EXTREMITIES:  No peripheral edema.   SKIN:  Clear with no obvious rashes or skin changes. No nail dyscrasia. NEURO:  Nonfocal. Well oriented.  Appropriate affect.     LAB RESULTS:  CMP     Component Value Date/Time   NA 139 05/24/2019 0845   NA 140 10/12/2017 0950   K 4.0 05/24/2019 0845   CL 106 05/24/2019 0845   CO2 25 05/24/2019 0845   GLUCOSE 105 (H) 05/24/2019 0845   BUN 9 05/24/2019 0845   BUN 10 10/12/2017 0950    CREATININE 0.72 05/24/2019 0845   CREATININE 0.78 09/07/2018 1455   CALCIUM 9.3 05/24/2019 0845   PROT 6.9 05/24/2019 0845   PROT 7.5 10/12/2017 0950   ALBUMIN 3.8 05/24/2019 0845   ALBUMIN 4.3 10/12/2017 0950   AST 14 (L) 05/24/2019 0845   AST 14 (L) 09/07/2018 1455   ALT 11 05/24/2019 0845   ALT 13 09/07/2018 1455   ALKPHOS 77 05/24/2019 0845   BILITOT <0.2 (L) 05/24/2019 0845   BILITOT 0.2 (L) 09/07/2018 1455   GFRNONAA >60 05/24/2019 0845   GFRNONAA >60 09/07/2018 1455   GFRAA >60 05/24/2019 0845   GFRAA >60 09/07/2018 1455    No results found for: Ronnald Ramp,  A1GS, A2GS, BETS, BETA2SER, GAMS, MSPIKE, SPEI  No results found for: KPAFRELGTCHN, LAMBDASER, KAPLAMBRATIO  Lab Results  Component Value Date   WBC 4.6 05/24/2019   NEUTROABS 3.7 05/24/2019   HGB 11.6 (L) 05/24/2019   HCT 34.0 (L) 05/24/2019   MCV 92.9 05/24/2019   PLT 220 05/24/2019    Lab Results  Component Value Date   LABCA2 <4 08/30/2007    No components found for: [id number]  No results for input(s): INR in the last 168 hours.  Lab Results  Component Value Date   LABCA2 <4 08/30/2007    No results found for: GMW102  No results found for: VOZ366  No results found for: YQI347  No results found for: CA2729  No components found for: HGQUANT  No results found for: CEA1 / No results found for: CEA1   No results found for: AFPTUMOR  No results found for: [place]  No results found for: PSA1  Appointment on 05/24/2019  Component Date Value Ref Range Status   Sodium 05/24/2019 139  135 - 145 mmol/L Final   Potassium 05/24/2019 4.0  3.5 - 5.1 mmol/L Final   Chloride 05/24/2019 106  98 - 111 mmol/L Final   CO2 05/24/2019 25  22 - 32 mmol/L Final   Glucose, Bld 05/24/2019 105* 70 - 99 mg/dL Final   BUN 05/24/2019 9  6 - 20 mg/dL Final   Creatinine, Ser 05/24/2019 0.72  0.44 - 1.00 mg/dL Final   Calcium 05/24/2019 9.3  8.9 - 10.3 mg/dL Final   Total Protein  05/24/2019 6.9  6.5 - 8.1 g/dL Final   Albumin 05/24/2019 3.8  3.5 - 5.0 g/dL Final   AST 05/24/2019 14* 15 - 41 U/L Final   ALT 05/24/2019 11  0 - 44 U/L Final   Alkaline Phosphatase 05/24/2019 77  38 - 126 U/L Final   Total Bilirubin 05/24/2019 <0.2* 0.3 - 1.2 mg/dL Final   GFR calc non Af Amer 05/24/2019 >60  >60 mL/min Final   GFR calc Af Amer 05/24/2019 >60  >60 mL/min Final   Anion gap 05/24/2019 8  5 - 15 Final   Performed at [facility], [place] [address] [place], [place] [address]   WBC 05/24/2019 4.6  4.0 - 10.5 K/uL Final   RBC 05/24/2019 3.66* 3.87 - 5.11 MIL/uL Final   Hemoglobin 05/24/2019 11.6* 12.0 - 15.0 g/dL Final   HCT 05/24/2019 34.0* 36.0 - 46.0 % Final   MCV 05/24/2019 92.9  80.0 - 100.0 fL Final   MCH 05/24/2019 31.7  26.0 - 34.0 pg Final   MCHC 05/24/2019 34.1  30.0 - 36.0 g/dL Final   RDW 05/24/2019 12.9  11.5 - 15.5 % Final   Platelets 05/24/2019 220  150 - 400 K/uL Final   nRBC 05/24/2019 0.0  0.0 - 0.2 % Final   Neutrophils Relative % 05/24/2019 81  % Final   Neutro Abs 05/24/2019 3.7  1.7 - 7.7 K/uL Final   Lymphocytes Relative 05/24/2019 10  % Final   Lymphs Abs 05/24/2019 0.5* 0.7 - 4.0 K/uL Final   Monocytes Relative 05/24/2019 8  % Final   Monocytes Absolute 05/24/2019 0.4  0.1 - 1.0 K/uL Final   Eosinophils Relative 05/24/2019 1  % Final   Eosinophils Absolute 05/24/2019 0.0  0.0 - 0.5 K/uL Final   Basophils Relative 05/24/2019 0  % Final   Basophils Absolute 05/24/2019 0.0  0.0 - 0.1 K/uL Final   Immature Granulocytes 05/24/2019 0  %  Final   Abs Immature Granulocytes 05/24/2019 0.01  0.00 - 0.07 K/uL Final   Performed at [facility], [place] [address], [place], [place] [address]    (this displays the last labs from the last 3 days)  No results found for: TOTALPROTELP, ALBUMINELP, A1GS, A2GS, BETS, BETA2SER, GAMS, MSPIKE, SPEI (this displays SPEP labs)  No results  found for: KPAFRELGTCHN, LAMBDASER, KAPLAMBRATIO (kappa/lambda light chains)  No results found for: HGBA, HGBA2QUANT, HGBFQUANT, HGBSQUAN (Hemoglobinopathy evaluation)   Lab Results  Component Value Date   LDH 169 08/30/2007    Lab Results  Component Value Date   IRON 78 01/01/2009   TIBC 424 08/30/2007   IRONPCTSAT 17.9 (L) 01/01/2009   (Iron and TIBC)  Lab Results  Component Value Date   FERRITIN 19 08/30/2007    Urinalysis    Component Value Date/Time   LABSPEC 1.015 04/09/2008 1548   PHURINE 7.0 04/09/2008 1548   HGBUR large 04/09/2008 1548   BILIRUBINUR negative 04/09/2008 1548   UROBILINOGEN 0.2 04/09/2008 1548   NITRITE negative 04/09/2008 1548     STUDIES:  No results found.  ELIGIBLE FOR AVAILABLE RESEARCH PROTOCOL:    ASSESSMENT: 53 y.o. [place], [place] woman  (1) history of left-sided ductal carcinoma in situ 2004  (a) s/p left mastectomy with TRAM reconstruction  (b) status post tamoxifen x3 years  (2) left breast upper outer quadrant biopsy 08/30/2018 shows a clinical T1c N0 invasive ductal carcinoma, grade 3, estrogen receptor positive, progesterone receptor negative, with HER-2 amplification, and and MIB-1 of 15%.   (3) neoadjuvant chemotherapy consisting of carboplatin, docetaxel, trastuzumab and Pertuzumab starting 09/26/2018, repeated every 21 days x 6.  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    (b) chemotherapy discontinued after 4 cycles because of intercurrent eye surgery  (c) continuing trastuzumab and Pertuzumab to complete a year  (d) echocardiogram on 01/23/2019 that shows well preserved EF of 60-65%  (e) echocardiogram on 04/23/2019 shows EF of 60-65%  (4) left lumpectomy 02/20/2019 showed a residual  ypT1c NX invasive ductal carcinoma, grade 2 with negative margins.    (5) adjuvant radiation 04/10/2019-05/28/2019  (6) antiestrogens to follow at the completion of local  treatment  PLAN: Collen is doing well today.  She is nearing the completion of her adjuvant radiation and I recommended that she apply the cream provided to her breast as instructed.  Her labs thus far are stable and she is tolerating the Trastuzumab/Pertuzumab every 3 weeks well.  She will continue this.    Bellamarie's activity level is good.  She is working and doing pretty well for this.  I explained that some fatigue with getting back in the swing of things while still undergoing radiation is normal.    Murray Hodgkins and I discussed her anti estrogen therapy as she is nearing the completion of her radiation.  She has tolerated Tamoxifen before, however is hesitant to take because she previously had cataracts, and then had the issue with her eyes and chemotherapy.  I gave her information on both Anastrozole and Tamoxifen.  She will return in 3 weeks prior to her next treatment and talk further with Dr. Jana Hakim.    We discussed her bone density in 2017 that showed osteopenia.  We talked about bone health including calcium, vitamin d and weight bearing exercise.   She was recommended to continue with the appropriate pandemic precautions. She knows to call for any other issue that  may develop before that visit.  A total of (30) minutes of face-to-face time was spent with this patient with greater than 50% of that time in counseling and care-coordination.  Scot Dock, NP Medical Oncology and Hematology [facility] [place], [place] [address] Tel. [phone]    Fax. [phone]

## 2019-05-24 NOTE — Progress Notes (Signed)
Patient's weight has decreased from 65.6 kg (used in treatment plan for dosing) to 59.5 kg today. This puts her previous trastuzumab-anns dose of 399 mg more than 10% of dose based on current weight (357 mg). Per Wilber Bihari, NP OK to update treatment plan with weight of 59.6 kg. Orders updated to reflect change.  Leron Croak, PharmD PGY2 Hematology/Oncology Pharmacy Resident 05/24/2019 10:35 AM

## 2019-05-24 NOTE — Patient Instructions (Signed)
[facility] Discharge Instructions for Patients Receiving Chemotherapy  Today you received the following chemotherapy agents: trastuzumab, perjeta.  To help prevent nausea and vomiting after your treatment, we encourage you to take your nausea medication as prescribed.   If you develop nausea and vomiting that is not controlled by your nausea medication, call the clinic.   BELOW ARE SYMPTOMS THAT SHOULD BE REPORTED IMMEDIATELY:  *FEVER GREATER THAN 100.5 F  *CHILLS WITH OR WITHOUT FEVER  NAUSEA AND VOMITING THAT IS NOT CONTROLLED WITH YOUR NAUSEA MEDICATION  *UNUSUAL SHORTNESS OF BREATH  *UNUSUAL BRUISING OR BLEEDING  TENDERNESS IN MOUTH AND THROAT WITH OR WITHOUT PRESENCE OF ULCERS  *URINARY PROBLEMS  *BOWEL PROBLEMS  UNUSUAL RASH Items with * indicate a potential emergency and should be followed up as soon as possible.  Feel free to call the clinic should you have any questions or concerns. The clinic phone number is [phone].  Please show the [place] at check-in to the Emergency Department and triage nurse.

## 2019-05-25 ENCOUNTER — Other Ambulatory Visit: Payer: Self-pay

## 2019-05-25 ENCOUNTER — Telehealth: Payer: Self-pay | Admitting: Oncology

## 2019-05-25 ENCOUNTER — Ambulatory Visit
Admission: RE | Admit: 2019-05-25 | Discharge: 2019-05-25 | Disposition: A | Discharge status: Home / Self Care | Payer: BC Managed Care – PPO | Source: Ambulatory Visit | Attending: Radiation Oncology | Admitting: Radiation Oncology

## 2019-05-25 DIAGNOSIS — C50412 Malignant neoplasm of upper-outer quadrant of left female breast: Secondary | ICD-10-CM | POA: Diagnosis not present

## 2019-05-25 NOTE — Telephone Encounter (Signed)
Returned patient's phone call regarding rescheduling an appointment, left a voicemail. 

## 2019-05-28 ENCOUNTER — Other Ambulatory Visit: Payer: Self-pay

## 2019-05-28 ENCOUNTER — Encounter: Payer: Self-pay | Admitting: *Deleted

## 2019-05-28 ENCOUNTER — Ambulatory Visit
Admission: RE | Admit: 2019-05-28 | Discharge: 2019-05-28 | Disposition: A | Discharge status: Home / Self Care | Payer: BC Managed Care – PPO | Source: Ambulatory Visit | Attending: Radiation Oncology | Admitting: Radiation Oncology

## 2019-05-28 DIAGNOSIS — C50412 Malignant neoplasm of upper-outer quadrant of left female breast: Secondary | ICD-10-CM | POA: Insufficient documentation

## 2019-05-28 DIAGNOSIS — Z510 Encounter for antineoplastic radiation therapy: Secondary | ICD-10-CM | POA: Diagnosis not present

## 2019-05-28 DIAGNOSIS — Z170 Estrogen receptor positive status [ER+]: Secondary | ICD-10-CM | POA: Insufficient documentation

## 2019-05-30 ENCOUNTER — Encounter: Payer: Self-pay | Admitting: *Deleted

## 2019-05-30 NOTE — Progress Notes (Signed)
[place] Work  Clinical Social Work received referral from financial advocate for additional resources.  CSW contacted patient at home to offer support and assess for needs.  CSW left patient a voicemail offering support and encouraging patient to return call.  Johnnye Lana, MSW, LCSW, OSW-C Clinical Social Worker [facility] [phone]

## 2019-05-31 ENCOUNTER — Telehealth: Payer: Self-pay | Admitting: *Deleted

## 2019-05-31 NOTE — Telephone Encounter (Signed)
This RN received VM from pt stating " why am I still getting chemo, I can't afford it ".  Return call number given as [phone].  This RN returned call and obtained pt's VM. Message left regarding reason of continued therapy but also that she is scheduled to see MD prior to next treatment and her questions can be further discussed.  Noted pt has a referral to our social work dept for further resources.  This note will be forwarded to MD for review of pt's concern for upcoming visit on 06/14/2019.

## 2019-06-04 ENCOUNTER — Encounter: Payer: Self-pay | Admitting: *Deleted

## 2019-06-04 ENCOUNTER — Encounter: Payer: Self-pay | Admitting: Radiation Oncology

## 2019-06-04 NOTE — Progress Notes (Signed)
[place] Work  [facility] representative made second attempt to contact patient to offer support regarding financial concerns and assess for needs.  CSW left patient a message with contact information encouraging patient to return call.   Johnnye Lana, MSW, LCSW, OSW-C Clinical Social Worker [facility] [phone]

## 2019-06-06 ENCOUNTER — Telehealth: Payer: Self-pay | Admitting: General Practice

## 2019-06-06 NOTE — Telephone Encounter (Signed)
[place] CSW Progress Notes  Call from patient, returning CSW El[place]'s call.  Wants information on resources for financial assistance.  CSW called, left VM w my contact information for patient.  Can email her information on these resources as well as awaiting her return call.  Asked her to confirm her email address.    Paige Shell, LCSW Clinical Social Worker Phone:  [phone]

## 2019-06-08 ENCOUNTER — Encounter: Payer: Self-pay | Admitting: General Practice

## 2019-06-08 NOTE — Progress Notes (Signed)
[place] CSW Progress Notes  Call from patient, she will review [employer] [place] and [employer].  Asked CSW Wallis Bamberg to meet w her in infusion on 11/19.  Patient advised to bring required paperwork to this appointment so her applications can be submitted at that time.  Edwyna Shell, LCSW Clinical Social Worker Phone:  [phone]

## 2019-06-13 NOTE — Progress Notes (Signed)
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   She receives Trastuzumab/Pertuzumab every 21 days.  She has been tolerating this with no side effects that she is aware of.  Her last echocardiogram was in September 2020.  It showed a well-preserved ejection fraction  Since her last visit, she completed radiation therapy on 05/28/2019.  She had some discomfort and quite a bit of hyperpigmentation.  She is very dissatisfied with the way of the breast and skin looks right now  Her most recent bone density testing performed in 11/2015 at [facility] showed a T-score of -1.6.   REVIEW OF SYSTEMS: Paige Foster is taking appropriate pandemic precautions at the same time that she is working full-time.  Her 2 children aged 39 119 also work outside the home.  She is still fatigued from her radiation.  But then she gets home all she can do is go to sleep.  Much.  She is not otherwise exercising but her job is very physical.  Aside from these concerns detailed review of systems today was stable   HISTORY OF CURRENT ILLNESS: From the original intake note:  Paige Foster has a prior history of left breast cancer, dating back to 2004. At that time she underwent a left  mastectomy for stage 0 (noninvasive) breast cancer, with transverse rectus abdominis (TRAM) flap construction under Dr. Towanda Malkin. She also underwent a right breast reduction. She took tamoxifen for three years.  More recently she underwent bilateral diagnostic mammography with tomography and left breast ultrasonography at [facility] on 01/03/2018 showing: Breast Density Category B. There is an oval fat containing lesion in the left breast upper outer quadrant posterior depth. No other significant masses, calcifications, or other findings are seen in either breast. Sonographically, there is a 1.5 cm lesion in the left breast upper outer quadrant posterior depth. This lesion is of mixed echogenicity. This correlates as palpated and with mammography findings. Follow up was recommended.  Close follow-up was suggested.  She then presented with a non-tender mass in the left reconstructed breast on 08/30/2018. On physical exam, there is a hard palpable lump measuring 2.0 cm in the upper outer left reconstructed breast 10 cm from the expected location of a nipple. Sonography over this area demonstrates a 1.8 cm x 1.7 cm x 1.4 cm mass in the left breast at 2 o'clock posterior depth 10 cm from the nipple. This mass is of mixed echogenicity. This abnormality is increased in size and correlates as palpated and with prior mammography findings. Color flow imaging demonstrates that there is vascularity present. Elastography imaging assessment is intermediate. No significant abnormalities were seen sonographically in the left axilla.    Accordingly on 08/30/2018 she proceeded to biopsy of the left breast mass in question. The pathology from this procedure showed ([id number]): invasive ductal carcinoma, grade III. Prognostic indicators significant for: estrogen receptor, 100% positive with  strong staining intensity and progesterone receptor, 0% negative. Proliferation marker Ki67 at 15%. HER2 positive (3+) by  immunohistochemistry.  The patient's subsequent history is as detailed below.   PAST MEDICAL HISTORY: Past Medical History:  Diagnosis Date   Anemia    History of blood transfusion 2004   History of colon polyps    Hypertension    Recurrent breast cancer, left [facility]) oncologist-- dr Jana Hakim    dx 2004, noninvasive Stage 0 ----s/p left mastectomy w/ tram flap construction (and right breast reduction), taken Tamoxifen for 3 yrs;   08-30-2018 recurrent left cancer , Grade III,  cT1c,  ER positive, PR negative, HER-2 positive, invasive ductal carcinoma-- neoadjuvant chemo to start 09-26-2018   Renal artery stenosis (HCC)    mild right external renal artery stenosis per duplex in epic 08-09-2013   Wears glasses     PAST SURGICAL HISTORY: Past Surgical History:  Procedure Laterality Date   BREAST LUMPECTOMY WITH RADIOACTIVE SEED LOCALIZATION Left 02/20/2019   Procedure: LEFT BREAST LUMPECTOMY WITH RADIOACTIVE SEED LOCALIZATION;  Surgeon: Rolm Bookbinder, MD;  Location: [place];  Service: General;  Laterality: Left;   BREAST SURGERY Left    Transflap   COLONOSCOPY     MASTECTOMY Left 2004   w/  TRAM flap construction and right breast augmentation with abdominoplasy   PARS PLANA VITRECTOMY Left 12/18/2018   Procedure: PARS PLANA VITRECTOMY WITH 25 GAUGE, ENDOLASER;  Surgeon: Jalene Mullet, MD;  Location: [facility];  Service: Ophthalmology;  Laterality: Left;   PORTACATH PLACEMENT N/A 09/25/2018   Procedure: INSERTION PORT-A-CATH WITH ULTRASOUND;  Surgeon: Rolm Bookbinder, MD;  Location: [facility];  Service: General;  Laterality: N/A;   TUBAL LIGATION Bilateral yrs ago     FAMILY HISTORY: Family History  Problem Relation Age of Onset   Diabetes Mother    Hypertension Mother    Kidney disease Father    Colon cancer Neg Hx    Colon polyps Neg Hx    Gallbladder disease Neg Hx    Heart disease Neg Hx    Esophageal cancer Neg Hx    Analysia's father died from unknown  causes in his early 24's. Patients' mother died from diabetes complications at age 110. The patient has 1 sister. Patient denies anyone in her family having breast, ovarian, prostate, or pancreatic cancer.    GYNECOLOGIC HISTORY:  Patient's last menstrual period was 06/08/2007. 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 Her husband, Elwin Mocha, works at [employer]. Paige Foster has two children, Paige Foster and Shanon Brow. Paige Foster lives with her, is 65, and it attending [employer] for a computer based degree. Shanon Brow lives with her, is 25, and recently graduated from [employer] with a degree in Careers information officer.  He works for [employer] has no grandchildren. She attends the [employer].   ADVANCED DIRECTIVES: In the absence of any documents to the contrary her husband, Elwin Mocha, is automatically her healthcare power of attorney     HEALTH MAINTENANCE: Social History   Tobacco Use   Smoking status: Never Smoker   Smokeless tobacco: Never Used  Substance Use Topics   Alcohol use: Not Currently    Alcohol/week: 0.0 standard drinks    Comment: Occassionally   Drug use: No    Colonoscopy: yes  PAP:   Bone density: yes, 2017; -1.6,  osteopenic   No Known Allergies  Current Outpatient Medications  Medication Sig Dispense Refill   amLODipine (NORVASC) 10 MG tablet Take 1 tablet (10 mg total) by mouth daily. Need office visit for additional refills 90 tablet 3   Ascorbic Acid (VITAMIN C) 1000 MG tablet Take 1,000 mg by mouth daily.     Flaxseed, Linseed, (FLAXSEED OIL) 1000 MG CAPS Take 1,000 mg by mouth daily.     LORazepam (ATIVAN) 0.5 MG tablet Take 1 tablet (0.5 mg total) by mouth every 8 (eight) hours as needed for anxiety or sleep. 30 tablet 0   losartan (COZAAR) 50 MG tablet Take 1 tablet (50 mg total) by mouth  daily. Need office visit for additional refills 90 tablet 1   Polyethyl Glycol-Propyl Glycol (SYSTANE) 0.4-0.3 % SOLN Place 1 drop into both eyes 4 (four) times daily as needed (for dry eyes).     prednisoLONE acetate (PRED FORTE) 1 % ophthalmic suspension Place 1 drop into the left eye 4 (four) times daily.     No current facility-administered medications for this visit.      OBJECTIVE: Middle-aged African-American woman who appears stated age  53:   06/14/19 1315  BP: 112/65  Pulse: (!) 54  Resp: (!) 2  Temp: 98.5 F (36.9 C)  SpO2: 100%     Body mass index is 22.88 kg/m.   Wt Readings from Last 3 Encounters:  06/14/19 133 lb 4.8 oz (60.5 kg)  05/24/19 131 lb 8 oz (59.6 kg)  04/12/19 131 lb 1.6 oz (59.5 kg)  ECOG FS:1  Sclerae unicteric, EOMs intact Wearing a mask No cervical or supraclavicular adenopathy Lungs no rales or rhonchi Heart regular rate and rhythm Abd soft, nontender, positive bowel sounds MSK no focal spinal tenderness, no upper extremity lymphedema Neuro: nonfocal, well oriented, appropriate affect Breasts: The left breast is status post mastectomy with TRAM reconstruction and radiation.  There is significant hyperpigmentation.  There is no evidence of local recurrence.  The right breast is benign.  Both axillae are benign.   LAB RESULTS:  CMP     Component Value Date/Time   NA 141 06/14/2019 1251   NA 140 10/12/2017 0950   K 3.9 06/14/2019 1251   CL 108 06/14/2019 1251   CO2 26 06/14/2019 1251   GLUCOSE 92 06/14/2019 1251   BUN 10 06/14/2019 1251   BUN 10 10/12/2017 0950   CREATININE 0.69 06/14/2019 1251   CREATININE 0.78 09/07/2018 1455   CALCIUM 9.2 06/14/2019 1251   PROT 6.8 06/14/2019 1251   PROT 7.5 10/12/2017 0950   ALBUMIN 3.9 06/14/2019 1251   ALBUMIN 4.3 10/12/2017 0950   AST 15 06/14/2019 1251   AST 14 (L) 09/07/2018 1455   ALT 14 06/14/2019 1251   ALT 13 09/07/2018 1455   ALKPHOS 74 06/14/2019 1251   BILITOT <0.2 (L)  06/14/2019 1251   BILITOT 0.2 (L) 09/07/2018 1455   GFRNONAA >60 06/14/2019 1251   GFRNONAA >60 09/07/2018 1455   GFRAA >60 06/14/2019 1251   GFRAA >60 09/07/2018 1455    No results found for: TOTALPROTELP, ALBUMINELP, A1GS, A2GS, BETS, BETA2SER, GAMS, MSPIKE, SPEI  No results found for: KPAFRELGTCHN, LAMBDASER, KAPLAMBRATIO  Lab Results  Component Value Date   WBC 3.6 (L) 06/14/2019   NEUTROABS 2.4 06/14/2019   HGB 10.8 (L) 06/14/2019   HCT 32.6 (L) 06/14/2019   MCV 96.7 06/14/2019   PLT 217 06/14/2019    Lab Results  Component Value Date   LABCA2 <  4 08/30/2007    No components found for: IHKVQQ595  No results for input(s): INR in the last 168 hours.  Lab Results  Component Value Date   LABCA2 <4 08/30/2007    No results found for: GLO756  No results found for: EPP295  No results found for: JOA416  No results found for: CA2729  No components found for: HGQUANT  No results found for: CEA1 / No results found for: CEA1   No results found for: AFPTUMOR  No results found for: [place]  No results found for: PSA1  Appointment on 06/14/2019  Component Date Value Ref Range Status   Sodium 06/14/2019 141  135 - 145 mmol/L Final   Potassium 06/14/2019 3.9  3.5 - 5.1 mmol/L Final   Chloride 06/14/2019 108  98 - 111 mmol/L Final   CO2 06/14/2019 26  22 - 32 mmol/L Final   Glucose, Bld 06/14/2019 92  70 - 99 mg/dL Final   BUN 06/14/2019 10  6 - 20 mg/dL Final   Creatinine, Ser 06/14/2019 0.69  0.44 - 1.00 mg/dL Final   Calcium 06/14/2019 9.2  8.9 - 10.3 mg/dL Final   Total Protein 06/14/2019 6.8  6.5 - 8.1 g/dL Final   Albumin 06/14/2019 3.9  3.5 - 5.0 g/dL Final   AST 06/14/2019 15  15 - 41 U/L Final   ALT 06/14/2019 14  0 - 44 U/L Final   Alkaline Phosphatase 06/14/2019 74  38 - 126 U/L Final   Total Bilirubin 06/14/2019 <0.2* 0.3 - 1.2 mg/dL Final   GFR calc non Af Amer 06/14/2019 >60  >60 mL/min Final   GFR calc Af Amer 06/14/2019 >60   >60 mL/min Final   Anion gap 06/14/2019 7  5 - 15 Final   Performed at [facility], [place] [address] [place], [place] [address]   WBC 06/14/2019 3.6* 4.0 - 10.5 K/uL Final   RBC 06/14/2019 3.37* 3.87 - 5.11 MIL/uL Final   Hemoglobin 06/14/2019 10.8* 12.0 - 15.0 g/dL Final   HCT 06/14/2019 32.6* 36.0 - 46.0 % Final   MCV 06/14/2019 96.7  80.0 - 100.0 fL Final   MCH 06/14/2019 32.0  26.0 - 34.0 pg Final   MCHC 06/14/2019 33.1  30.0 - 36.0 g/dL Final   RDW 06/14/2019 12.9  11.5 - 15.5 % Final   Platelets 06/14/2019 217  150 - 400 K/uL Final   nRBC 06/14/2019 0.0  0.0 - 0.2 % Final   Neutrophils Relative % 06/14/2019 65  % Final   Neutro Abs 06/14/2019 2.4  1.7 - 7.7 K/uL Final   Lymphocytes Relative 06/14/2019 23  % Final   Lymphs Abs 06/14/2019 0.8  0.7 - 4.0 K/uL Final   Monocytes Relative 06/14/2019 12  % Final   Monocytes Absolute 06/14/2019 0.4  0.1 - 1.0 K/uL Final   Eosinophils Relative 06/14/2019 0  % Final   Eosinophils Absolute 06/14/2019 0.0  0.0 - 0.5 K/uL Final   Basophils Relative 06/14/2019 0  % Final   Basophils Absolute 06/14/2019 0.0  0.0 - 0.1 K/uL Final   Immature Granulocytes 06/14/2019 0  % Final   Abs Immature Granulocytes 06/14/2019 0.01  0.00 - 0.07 K/uL Final   Performed at [facility], [place] [address], [place], [place] [address]    (this displays the last labs from the last 3 days)  No results found for: TOTALPROTELP, ALBUMINELP, A1GS, A2GS, BETS, BETA2SER, GAMS, MSPIKE, SPEI (this displays SPEP labs)  No results found for: KPAFRELGTCHN, LAMBDASER, KAPLAMBRATIO (kappa/lambda light chains)  No results found for: HGBA, HGBA2QUANT, HGBFQUANT, HGBSQUAN (Hemoglobinopathy evaluation)   Lab Results  Component Value Date   LDH 169 08/30/2007    Lab Results  Component Value Date   IRON 78 01/01/2009   TIBC 424 08/30/2007   IRONPCTSAT 17.9 (L) 01/01/2009   (Iron and TIBC)  Lab  Results  Component Value Date   FERRITIN 19 08/30/2007    Urinalysis    Component Value Date/Time   LABSPEC 1.015 04/09/2008 1548   PHURINE 7.0 04/09/2008 1548   HGBUR large 04/09/2008 1548   BILIRUBINUR negative 04/09/2008 1548   UROBILINOGEN 0.2 04/09/2008 1548   NITRITE negative 04/09/2008 1548     STUDIES:  No results found.  ELIGIBLE FOR AVAILABLE RESEARCH PROTOCOL:    ASSESSMENT: 53 y.o. [place], [place] woman  (1) history of left-sided ductal carcinoma in situ 2004  (a) s/p left mastectomy with TRAM reconstruction  (b) status post tamoxifen x3 years  (2) left breast upper outer quadrant biopsy 08/30/2018 shows a clinical T1c N0 invasive ductal carcinoma, grade 3, estrogen receptor positive, progesterone receptor negative, with HER-2 amplification, and and MIB-1 of 15%.   (3) neoadjuvant chemotherapy consisting of carboplatin, docetaxel, trastuzumab and Pertuzumab starting 09/26/2018, repeated every 21 days x 6.  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    (b) chemotherapy discontinued after 4 cycles because of intercurrent eye surgery  (c) continuing trastuzumab and Pertuzumab to complete a year (through February 2021)  (d) echocardiogram on 01/23/2019 that shows well preserved EF of 60-65%  (e) echocardiogram on 04/23/2019 shows EF of 60-65%  (4) left lumpectomy 02/20/2019 showed a residual  ypT1c NX invasive ductal carcinoma, grade 2 with negative margins.    (5) adjuvant radiation 04/10/2019-05/28/2019  (6) anastrozole started 06/26/2019  (a) DEXA scan at [facility] 12/24/2015 found a T score of -1.6  (7) genetics testing 02/03/2019 through the Common Hereditary Cancers Panel offered by [facility] found no deleterious mutations in APC, ATM, AXIN2, BARD1, BMPR1A, BRCA1, BRCA2, BRIP1, CDH1, CDKN2A (p14ARF), CDKN2A (p16INK4a), CKD4, CHEK2, CTNNA1, DICER1, EPCAM (Deletion/duplication testing only), GREM1 (promoter  region deletion/duplication testing only), KIT, MEN1, MLH1, MSH2, MSH3, MSH6, MUTYH, NBN, NF1, NHTL1, PALB2, PDGFRA, PMS2, POLD1, POLE, PTEN, RAD50, RAD51C, RAD51D, RNF43, SDHB, SDHC, SDHD, SMAD4, SMARCA4. STK11, TP53, TSC1, TSC2, and VHL.  The following genes were evaluated for sequence changes only: SDHA and HOXB13 c.251G>A variant only.  PLAN: Daleisa is done with local treatment for her recurrent breast cancer.  She did very well with surgery and radiation although she still has significant hyperpigmentation and fatigue.  Nevertheless she is working full-time.  She and her family are taking appropriate pandemic precautions  She continues on trastuzumab and Pertuzumab today, with 4 more doses to go.  Her last dose will be mid February and after that she can have her port removed.  She understands the drugs help her immune system fight the cancer.  She is not ready to start antiestrogens.  She is very concerned about tamoxifen because of eye issues.  We have not been able to reassure her regarding that.  Accordingly she will start anastrozole.  She has a good understanding of the possible toxicities side effects and complications of this agent.  She will start 06/26/2019.  She will resume mammography in April.  She tells me she wants to do this at the breast center.  She will have a  bone density at the same time.  She will then see me and we will assess her tolerance of anastrozole then as well as began long-term follow-up  She knows to call us for any other issue that may develop before the next visit.  Chauncey Cruel, MD Medical Oncology and Hematology [facility] [place], [place] [address] Tel. [phone]    Fax. [phone]   I, Wilburn Mylar, am acting as scribe for Dr. Virgie Dad. Savannah Erbe.  I, Lurline Del MD, have reviewed the above documentation for accuracy and completeness, and I agree with the above.

## 2019-06-14 ENCOUNTER — Encounter: Payer: Self-pay | Admitting: General Practice

## 2019-06-14 ENCOUNTER — Inpatient Hospital Stay: Payer: BC Managed Care – PPO

## 2019-06-14 ENCOUNTER — Other Ambulatory Visit: Payer: Self-pay

## 2019-06-14 ENCOUNTER — Inpatient Hospital Stay: Payer: BC Managed Care – PPO | Attending: Oncology

## 2019-06-14 ENCOUNTER — Inpatient Hospital Stay: Payer: BC Managed Care – PPO | Admitting: General Practice

## 2019-06-14 ENCOUNTER — Inpatient Hospital Stay (HOSPITAL_BASED_OUTPATIENT_CLINIC_OR_DEPARTMENT_OTHER): Payer: BC Managed Care – PPO | Admitting: Oncology

## 2019-06-14 VITALS — BP 112/65 | HR 54 | Temp 98.5°F | Resp 2 | Ht 64.0 in | Wt 133.3 lb

## 2019-06-14 VITALS — BP 117/79 | HR 54 | Temp 98.4°F | Resp 18

## 2019-06-14 DIAGNOSIS — C50912 Malignant neoplasm of unspecified site of left female breast: Secondary | ICD-10-CM | POA: Diagnosis not present

## 2019-06-14 DIAGNOSIS — Z170 Estrogen receptor positive status [ER+]: Secondary | ICD-10-CM | POA: Diagnosis not present

## 2019-06-14 DIAGNOSIS — Z79811 Long term (current) use of aromatase inhibitors: Secondary | ICD-10-CM | POA: Diagnosis not present

## 2019-06-14 DIAGNOSIS — Z923 Personal history of irradiation: Secondary | ICD-10-CM | POA: Insufficient documentation

## 2019-06-14 DIAGNOSIS — M8580 Other specified disorders of bone density and structure, unspecified site: Secondary | ICD-10-CM | POA: Insufficient documentation

## 2019-06-14 DIAGNOSIS — C50412 Malignant neoplasm of upper-outer quadrant of left female breast: Secondary | ICD-10-CM

## 2019-06-14 DIAGNOSIS — Z5112 Encounter for antineoplastic immunotherapy: Secondary | ICD-10-CM | POA: Diagnosis not present

## 2019-06-14 DIAGNOSIS — Z9012 Acquired absence of left breast and nipple: Secondary | ICD-10-CM | POA: Insufficient documentation

## 2019-06-14 DIAGNOSIS — Z79899 Other long term (current) drug therapy: Secondary | ICD-10-CM | POA: Diagnosis not present

## 2019-06-14 LAB — COMPREHENSIVE METABOLIC PANEL
ALT: 14 U/L (ref 0–44)
AST: 15 U/L (ref 15–41)
Albumin: 3.9 g/dL (ref 3.5–5.0)
Alkaline Phosphatase: 74 U/L (ref 38–126)
Anion gap: 7 (ref 5–15)
BUN: 10 mg/dL (ref 6–20)
CO2: 26 mmol/L (ref 22–32)
Calcium: 9.2 mg/dL (ref 8.9–10.3)
Chloride: 108 mmol/L (ref 98–111)
Creatinine, Ser: 0.69 mg/dL (ref 0.44–1.00)
GFR calc Af Amer: >60 mL/min (ref >60)
GFR calc non Af Amer: >60 mL/min (ref >60)
Glucose, Bld: 92 mg/dL (ref 70–99)
Potassium: 3.9 mmol/L (ref 3.5–5.1)
Sodium: 141 mmol/L (ref 135–145)
Total Bilirubin: <0.2 mg/dL — ABNORMAL LOW (ref 0.3–1.2)
Total Protein: 6.8 g/dL (ref 6.5–8.1)

## 2019-06-14 LAB — CBC WITH DIFFERENTIAL/PLATELET
Abs Immature Granulocytes: 0.01 10*3/uL (ref 0.00–0.07)
Basophils Absolute: 0 10*3/uL (ref 0.0–0.1)
Basophils Relative: 0 %
Eosinophils Absolute: 0 10*3/uL (ref 0.0–0.5)
Eosinophils Relative: 0 %
HCT: 32.6 % — ABNORMAL LOW (ref 36.0–46.0)
Hemoglobin: 10.8 g/dL — ABNORMAL LOW (ref 12.0–15.0)
Immature Granulocytes: 0 %
Lymphocytes Relative: 23 %
Lymphs Abs: 0.8 10*3/uL (ref 0.7–4.0)
MCH: 32 pg (ref 26.0–34.0)
MCHC: 33.1 g/dL (ref 30.0–36.0)
MCV: 96.7 fL (ref 80.0–100.0)
Monocytes Absolute: 0.4 10*3/uL (ref 0.1–1.0)
Monocytes Relative: 12 %
Neutro Abs: 2.4 10*3/uL (ref 1.7–7.7)
Neutrophils Relative %: 65 %
Platelets: 217 10*3/uL (ref 150–400)
RBC: 3.37 MIL/uL — ABNORMAL LOW (ref 3.87–5.11)
RDW: 12.9 % (ref 11.5–15.5)
WBC: 3.6 10*3/uL — ABNORMAL LOW (ref 4.0–10.5)
nRBC: 0 % (ref 0.0–0.2)

## 2019-06-14 MED ORDER — ACETAMINOPHEN 325 MG PO TABS
ORAL_TABLET | ORAL | Status: AC
  Filled 2019-06-14: qty 2

## 2019-06-14 MED ORDER — SODIUM CHLORIDE 0.9 % IV SOLN
420.0000 mg | Freq: Once | INTRAVENOUS | Status: AC
  Administered 2019-06-14: 420 mg via INTRAVENOUS
  Filled 2019-06-14: qty 14

## 2019-06-14 MED ORDER — ANASTROZOLE 1 MG PO TABS: 1.0000 mg | ORAL_TABLET | Freq: Every day | ORAL | 4 refills | Status: DC

## 2019-06-14 MED ORDER — ACETAMINOPHEN 325 MG PO TABS
650.0000 mg | ORAL_TABLET | Freq: Once | ORAL | Status: AC
  Administered 2019-06-14: 650 mg via ORAL

## 2019-06-14 MED ORDER — SODIUM CHLORIDE 0.9% FLUSH
10.0000 mL | INTRAVENOUS | Status: DC | PRN
  Administered 2019-06-14: 10 mL
  Filled 2019-06-14: qty 10

## 2019-06-14 MED ORDER — DIPHENHYDRAMINE HCL 25 MG PO CAPS
25.0000 mg | ORAL_CAPSULE | Freq: Once | ORAL | Status: AC
  Administered 2019-06-14: 25 mg via ORAL

## 2019-06-14 MED ORDER — SODIUM CHLORIDE 0.9 % IV SOLN
Freq: Once | INTRAVENOUS | Status: AC
  Administered 2019-06-14: 14:00:00 via INTRAVENOUS
  Filled 2019-06-14: qty 250

## 2019-06-14 MED ORDER — DIPHENHYDRAMINE HCL 25 MG PO CAPS
ORAL_CAPSULE | ORAL | Status: AC
  Filled 2019-06-14: qty 1

## 2019-06-14 MED ORDER — HEPARIN SOD (PORK) LOCK FLUSH 100 UNIT/ML IV SOLN
500.0000 [IU] | Freq: Once | INTRAVENOUS | Status: AC | PRN
  Administered 2019-06-14: 500 [IU]
  Filled 2019-06-14: qty 5

## 2019-06-14 MED ORDER — TRASTUZUMAB-ANNS CHEMO 150 MG IV SOLR
6.0000 mg/kg | Freq: Once | INTRAVENOUS | Status: AC
  Administered 2019-06-14: 399 mg via INTRAVENOUS
  Filled 2019-06-14: qty 19

## 2019-06-14 NOTE — Progress Notes (Signed)
[facility] CSW Progress Notes  Met w patient in infusion waiting area.  Reviewed documents needed to submit applications for financial assistance - patient completed her part during infusion.  CSW submitted applications to [place] in [place] on patient behalf by secure email today.  Edwyna Shell, LCSW Clinical Social Worker Phone:  [phone]

## 2019-06-14 NOTE — Patient Instructions (Signed)
[facility] Discharge Instructions for Patients Receiving Chemotherapy  Today you received the following chemotherapy agents: trastuzumab, perjeta.  To help prevent nausea and vomiting after your treatment, we encourage you to take your nausea medication as prescribed.   If you develop nausea and vomiting that is not controlled by your nausea medication, call the clinic.   BELOW ARE SYMPTOMS THAT SHOULD BE REPORTED IMMEDIATELY:  *FEVER GREATER THAN 100.5 F  *CHILLS WITH OR WITHOUT FEVER  NAUSEA AND VOMITING THAT IS NOT CONTROLLED WITH YOUR NAUSEA MEDICATION  *UNUSUAL SHORTNESS OF BREATH  *UNUSUAL BRUISING OR BLEEDING  TENDERNESS IN MOUTH AND THROAT WITH OR WITHOUT PRESENCE OF ULCERS  *URINARY PROBLEMS  *BOWEL PROBLEMS  UNUSUAL RASH Items with * indicate a potential emergency and should be followed up as soon as possible.  Feel free to call the clinic should you have any questions or concerns. The clinic phone number is [phone].  Please show the [facility] at check-in to the Emergency Department and triage nurse.

## 2019-06-14 NOTE — Progress Notes (Signed)
Patient declined to stay for 30 minute post observation period. Patient VS stable at completion of infusion. Patient given AVS and instructed to go to ED if she starts to develop signs or symptoms of a reaction. Patient verbalized understanding.

## 2019-06-15 ENCOUNTER — Encounter (HOSPITAL_COMMUNITY): Payer: Self-pay | Admitting: Oncology

## 2019-06-18 ENCOUNTER — Other Ambulatory Visit: Payer: Self-pay | Admitting: Nurse Practitioner

## 2019-06-18 DIAGNOSIS — I10 Essential (primary) hypertension: Secondary | ICD-10-CM

## 2019-06-18 MED ORDER — LOSARTAN POTASSIUM 50 MG PO TABS: 50.0000 mg | ORAL_TABLET | Freq: Every day | ORAL | 0 refills | Status: DC

## 2019-06-18 NOTE — Telephone Encounter (Signed)
Medication Refill - Medication:losartan (COZAAR) 50 MG tablet  Has the patient contacted their pharmacy? Yes.   (Agent: If no, request that the patient contact the pharmacy for the refill.) (Agent: If yes, when and what did the pharmacy advise?)  Preferred Pharmacy (with phone number or street name): [id number] - [place], [address] AT [address]  Agent: Please be advised that RX refills may take up to 3 business days. We ask that you follow-up with your pharmacy.

## 2019-06-18 NOTE — Telephone Encounter (Signed)
Patient have OV with Paige Foster on 06/25/2019.  Will provide enough medication until OV. /

## 2019-06-25 ENCOUNTER — Telehealth: Payer: BC Managed Care – PPO | Admitting: Nurse Practitioner

## 2019-06-25 ENCOUNTER — Other Ambulatory Visit: Payer: Self-pay | Admitting: Oncology

## 2019-06-25 ENCOUNTER — Telehealth (HOSPITAL_COMMUNITY): Payer: Self-pay

## 2019-06-25 NOTE — Progress Notes (Unsigned)
From the echocardiogram scheduling desk:  New message    Just an FYI. We have made several attempts to contact this patient including sending a letter to schedule or reschedule their echocardiogram. We will be removing the patient from the echo WQ.   11.25.20 @ 11:46am lm on home vm - gesila  11.23.20 @ 3:29pm both # are the same - lm on home vm - gesila   11.20.20 @ 11:49am Both # are the same  - lm on home vm - gesila . 11.20.20 mail reminder letter Jari Sportsman

## 2019-06-25 NOTE — Telephone Encounter (Signed)
New message    Just an FYI. We have made several attempts to contact this patient including sending a letter to schedule or reschedule their echocardiogram. We will be removing the patient from the echo WQ.   11.25.20 @ 11:46am lm on home vm - Audiel Scheiber  11.23.20 @ 3:29pm both # are the same - lm on home vm - Tyne Banta   11.20.20 @ 11:49am Both # are the same  - lm on home vm - Seng Fouts . 11.20.20 mail reminder letter Jari Sportsman

## 2019-06-25 NOTE — Telephone Encounter (Signed)
Thank you for your perseverance  GM

## 2019-06-26 ENCOUNTER — Telehealth (INDEPENDENT_AMBULATORY_CARE_PROVIDER_SITE_OTHER): Payer: BC Managed Care – PPO | Admitting: Nurse Practitioner

## 2019-06-26 ENCOUNTER — Encounter: Payer: Self-pay | Admitting: Nurse Practitioner

## 2019-06-26 DIAGNOSIS — I10 Essential (primary) hypertension: Secondary | ICD-10-CM

## 2019-06-26 MED ORDER — LOSARTAN POTASSIUM 50 MG PO TABS: 50.0000 mg | ORAL_TABLET | Freq: Every day | ORAL | 3 refills | Status: DC

## 2019-06-26 NOTE — Patient Instructions (Addendum)
Maintain current medications. Lipid panel last done 08/2018 and CMP 06/14/2019 (results are unremarkable at this time). Will repeat labs fasting in 63months.  Let me know if you need referral for individual psychotherapy.

## 2019-06-26 NOTE — Progress Notes (Signed)
Virtual Visit via Video Note  I connected with Paige Foster on 06/26/19 at  3:00 PM EST by a video enabled telemedicine application and verified that I am speaking with the correct person using two identifiers.  Location: Patient: in private car Provider: office  only provider and patient present during I discussed the limitations of evaluation and management by telemedicine and the availability of in person appointments. The patient expressed understanding and agreed to proceed.  History of Present Illness: HTN: Controlled with amlodipine and losartan BP Readings from Last 3 Encounters:  06/26/19 122/90  06/14/19 117/79  06/14/19 112/65   Ongoing chemotherapy for left breast cancer. Home with husband. She plans to contact SW for virtual support group sessions.   Observations/Objective: Physical Exam  Constitutional: She is oriented to person, place, and time.  Cardiovascular: Normal rate.  Pulmonary/Chest: Effort normal.  Neurological: She is alert and oriented to person, place, and time.  Vitals reviewed.  Assessment and Plan: Paige Foster was seen today for follow-up.  Diagnoses and all orders for this visit:  Essential hypertension -     losartan (COZAAR) 50 MG tablet; Take 1 tablet (50 mg total) by mouth daily. Need office visit for additional refills   Follow Up Instructions: See avs   I discussed the assessment and treatment plan with the patient. The patient was provided an opportunity to ask questions and all were answered. The patient agreed with the plan and demonstrated an understanding of the instructions.   The patient was advised to call back or seek an in-person evaluation if the symptoms worsen or if the condition fails to improve as anticipated.  Paige Lacy, NP

## 2019-06-28 NOTE — Progress Notes (Signed)
I called the patient today about her upcoming follow-up appointment in radiation oncology.   Given the state of the COVID-19 pandemic, concerning case numbers in our community, and guidance from [facility], I offered a phone assessment with the patient to determine if coming to the clinic was necessary. She accepted.  I let the patient know that I had spoken with Dr. Isidore Moos, and she wanted them to know the importance of washing their hands for at least 20 seconds at a time, especially after going out in public, and before they eat.  Limit going out in public whenever possible. Do not touch your face, unless your hands are clean, such as when bathing. Get plenty of rest, eat well, and stay hydrated.   The patient denies any symptomatic concerns.  Specifically, they report good healing of their skin in the radiation fields.  Skin is intact.    I recommended that she continue skin care by applying oil or lotion with vitamin E to the skin in the radiation fields, BID, for 2 more months.  Continue follow-up with medical oncology - follow-up is scheduled on 07/05/19 for Hercepta/Perjeta infusion with Dr. Lindi Adie.  I explained that yearly mammograms are important for patients with intact breast tissue, and physical exams are important after mastectomy for patients that cannot undergo mammography.  I encouraged her to call if she had further questions or concerns about her healing. Otherwise, she will follow-up PRN in radiation oncology. Patient is pleased with this plan, and we will cancel her upcoming follow-up to reduce the risk of COVID-19 transmission.

## 2019-06-29 ENCOUNTER — Ambulatory Visit
Admission: RE | Admit: 2019-06-29 | Discharge: 2019-06-29 | Disposition: A | Discharge status: Home / Self Care | Payer: BC Managed Care – PPO | Source: Ambulatory Visit | Attending: Radiation Oncology | Admitting: Radiation Oncology

## 2019-07-03 ENCOUNTER — Telehealth: Payer: Self-pay | Admitting: Oncology

## 2019-07-03 NOTE — Telephone Encounter (Signed)
Returned patient's phone call regarding rescheduling 12/10 appointments, left a voicemail.

## 2019-07-03 NOTE — Telephone Encounter (Signed)
Returned patient's phone call regarding her rescheduling request, 12/10 lab and port flush appointments have been rescheduled to 12/09. 12/10 infusion stays as is.

## 2019-07-04 ENCOUNTER — Other Ambulatory Visit: Payer: Self-pay

## 2019-07-04 ENCOUNTER — Inpatient Hospital Stay: Payer: BC Managed Care – PPO

## 2019-07-04 ENCOUNTER — Inpatient Hospital Stay: Payer: BC Managed Care – PPO | Attending: Oncology

## 2019-07-04 DIAGNOSIS — M8580 Other specified disorders of bone density and structure, unspecified site: Secondary | ICD-10-CM | POA: Diagnosis not present

## 2019-07-04 DIAGNOSIS — Z79811 Long term (current) use of aromatase inhibitors: Secondary | ICD-10-CM | POA: Insufficient documentation

## 2019-07-04 DIAGNOSIS — Z5112 Encounter for antineoplastic immunotherapy: Secondary | ICD-10-CM | POA: Diagnosis not present

## 2019-07-04 DIAGNOSIS — C50412 Malignant neoplasm of upper-outer quadrant of left female breast: Secondary | ICD-10-CM

## 2019-07-04 DIAGNOSIS — Z170 Estrogen receptor positive status [ER+]: Secondary | ICD-10-CM | POA: Insufficient documentation

## 2019-07-04 DIAGNOSIS — Z79899 Other long term (current) drug therapy: Secondary | ICD-10-CM | POA: Insufficient documentation

## 2019-07-04 DIAGNOSIS — Z9012 Acquired absence of left breast and nipple: Secondary | ICD-10-CM | POA: Diagnosis not present

## 2019-07-04 DIAGNOSIS — Z923 Personal history of irradiation: Secondary | ICD-10-CM | POA: Diagnosis not present

## 2019-07-04 DIAGNOSIS — Z95828 Presence of other vascular implants and grafts: Secondary | ICD-10-CM

## 2019-07-04 DIAGNOSIS — C50912 Malignant neoplasm of unspecified site of left female breast: Secondary | ICD-10-CM

## 2019-07-04 LAB — COMPREHENSIVE METABOLIC PANEL
ALT: 17 U/L (ref 0–44)
AST: 18 U/L (ref 15–41)
Albumin: 4.2 g/dL (ref 3.5–5.0)
Alkaline Phosphatase: 83 U/L (ref 38–126)
Anion gap: 11 (ref 5–15)
BUN: 8 mg/dL (ref 6–20)
CO2: 24 mmol/L (ref 22–32)
Calcium: 9.5 mg/dL (ref 8.9–10.3)
Chloride: 105 mmol/L (ref 98–111)
Creatinine, Ser: 0.7 mg/dL (ref 0.44–1.00)
GFR calc Af Amer: >60 mL/min (ref >60)
GFR calc non Af Amer: >60 mL/min (ref >60)
Glucose, Bld: 97 mg/dL (ref 70–99)
Potassium: 3.5 mmol/L (ref 3.5–5.1)
Sodium: 140 mmol/L (ref 135–145)
Total Bilirubin: 0.3 mg/dL (ref 0.3–1.2)
Total Protein: 7.4 g/dL (ref 6.5–8.1)

## 2019-07-04 LAB — CBC WITH DIFFERENTIAL/PLATELET
Abs Immature Granulocytes: 0.01 10*3/uL (ref 0.00–0.07)
Basophils Absolute: 0 10*3/uL (ref 0.0–0.1)
Basophils Relative: 0 %
Eosinophils Absolute: 0 10*3/uL (ref 0.0–0.5)
Eosinophils Relative: 0 %
HCT: 35.1 % — ABNORMAL LOW (ref 36.0–46.0)
Hemoglobin: 11.8 g/dL — ABNORMAL LOW (ref 12.0–15.0)
Immature Granulocytes: 0 %
Lymphocytes Relative: 29 %
Lymphs Abs: 1.3 10*3/uL (ref 0.7–4.0)
MCH: 32.4 pg (ref 26.0–34.0)
MCHC: 33.6 g/dL (ref 30.0–36.0)
MCV: 96.4 fL (ref 80.0–100.0)
Monocytes Absolute: 0.5 10*3/uL (ref 0.1–1.0)
Monocytes Relative: 10 %
Neutro Abs: 2.7 10*3/uL (ref 1.7–7.7)
Neutrophils Relative %: 61 %
Platelets: 245 10*3/uL (ref 150–400)
RBC: 3.64 MIL/uL — ABNORMAL LOW (ref 3.87–5.11)
RDW: 12.5 % (ref 11.5–15.5)
WBC: 4.5 10*3/uL (ref 4.0–10.5)
nRBC: 0 % (ref 0.0–0.2)

## 2019-07-04 MED ORDER — HEPARIN SOD (PORK) LOCK FLUSH 100 UNIT/ML IV SOLN
500.0000 [IU] | Freq: Once | INTRAVENOUS | Status: AC
  Administered 2019-07-04: 15:00:00 500 [IU]
  Filled 2019-07-04: qty 5

## 2019-07-04 MED ORDER — SODIUM CHLORIDE 0.9% FLUSH
10.0000 mL | Freq: Once | INTRAVENOUS | Status: AC
  Administered 2019-07-04: 10 mL
  Filled 2019-07-04: qty 10

## 2019-07-05 ENCOUNTER — Inpatient Hospital Stay: Payer: BC Managed Care – PPO

## 2019-07-05 ENCOUNTER — Other Ambulatory Visit: Payer: BC Managed Care – PPO

## 2019-07-05 ENCOUNTER — Other Ambulatory Visit: Payer: Self-pay

## 2019-07-05 VITALS — BP 144/97 | HR 64 | Temp 98.4°F | Resp 18 | Wt 131.0 lb

## 2019-07-05 DIAGNOSIS — Z170 Estrogen receptor positive status [ER+]: Secondary | ICD-10-CM

## 2019-07-05 DIAGNOSIS — Z5112 Encounter for antineoplastic immunotherapy: Secondary | ICD-10-CM | POA: Diagnosis not present

## 2019-07-05 DIAGNOSIS — C50412 Malignant neoplasm of upper-outer quadrant of left female breast: Secondary | ICD-10-CM

## 2019-07-05 MED ORDER — SODIUM CHLORIDE 0.9 % IV SOLN
420.0000 mg | Freq: Once | INTRAVENOUS | Status: AC
  Administered 2019-07-05: 420 mg via INTRAVENOUS
  Filled 2019-07-05: qty 14

## 2019-07-05 MED ORDER — ACETAMINOPHEN 325 MG PO TABS
650.0000 mg | ORAL_TABLET | Freq: Once | ORAL | Status: AC
  Administered 2019-07-05: 650 mg via ORAL

## 2019-07-05 MED ORDER — ACETAMINOPHEN 325 MG PO TABS
ORAL_TABLET | ORAL | Status: AC
  Filled 2019-07-05: qty 2

## 2019-07-05 MED ORDER — TRASTUZUMAB-ANNS CHEMO 150 MG IV SOLR: 6.0000 mg/kg | Freq: Once | INTRAVENOUS | Status: DC

## 2019-07-05 MED ORDER — DIPHENHYDRAMINE HCL 25 MG PO CAPS
25.0000 mg | ORAL_CAPSULE | Freq: Once | ORAL | Status: AC
  Administered 2019-07-05: 15:00:00 25 mg via ORAL

## 2019-07-05 MED ORDER — SODIUM CHLORIDE 0.9% FLUSH
10.0000 mL | INTRAVENOUS | Status: DC | PRN
  Administered 2019-07-05: 10 mL
  Filled 2019-07-05: qty 10

## 2019-07-05 MED ORDER — SODIUM CHLORIDE 0.9 % IV SOLN
Freq: Once | INTRAVENOUS | Status: AC
  Administered 2019-07-05: 15:00:00 via INTRAVENOUS
  Filled 2019-07-05: qty 250

## 2019-07-05 MED ORDER — DIPHENHYDRAMINE HCL 25 MG PO CAPS
ORAL_CAPSULE | ORAL | Status: AC
  Filled 2019-07-05: qty 1

## 2019-07-05 MED ORDER — TRASTUZUMAB-ANNS CHEMO 150 MG IV SOLR
6.0000 mg/kg | Freq: Once | INTRAVENOUS | Status: AC
  Administered 2019-07-05: 357 mg via INTRAVENOUS
  Filled 2019-07-05: qty 17

## 2019-07-05 MED ORDER — HEPARIN SOD (PORK) LOCK FLUSH 100 UNIT/ML IV SOLN
500.0000 [IU] | Freq: Once | INTRAVENOUS | Status: AC | PRN
  Administered 2019-07-05: 500 [IU]
  Filled 2019-07-05: qty 5

## 2019-07-05 NOTE — Patient Instructions (Signed)
[facility] Discharge Instructions for Patients Receiving Chemotherapy  Today you received the following chemotherapy agents Trastuzumab (KANJINTI) & Pertuzumab (PERJETA).  To help prevent nausea and vomiting after your treatment, we encourage you to take your nausea medication as prescribed.   If you develop nausea and vomiting that is not controlled by your nausea medication, call the clinic.   BELOW ARE SYMPTOMS THAT SHOULD BE REPORTED IMMEDIATELY:  *FEVER GREATER THAN 100.5 F  *CHILLS WITH OR WITHOUT FEVER  NAUSEA AND VOMITING THAT IS NOT CONTROLLED WITH YOUR NAUSEA MEDICATION  *UNUSUAL SHORTNESS OF BREATH  *UNUSUAL BRUISING OR BLEEDING  TENDERNESS IN MOUTH AND THROAT WITH OR WITHOUT PRESENCE OF ULCERS  *URINARY PROBLEMS  *BOWEL PROBLEMS  UNUSUAL RASH Items with * indicate a potential emergency and should be followed up as soon as possible.  Feel free to call the clinic should you have any questions or concerns. The clinic phone number is [phone].  Please show the [facility] at check-in to the Emergency Department and triage nurse.  Coronavirus (COVID-19) Are you at risk?  Are you at risk for the Coronavirus (COVID-19)?  To be considered HIGH RISK for Coronavirus (COVID-19), you have to meet the following criteria:  . Traveled to [place], [place], [place], [place] or [place]; or in the [place] to [place], [place], [place], or [place]; and have fever, cough, and shortness of breath within the last 2 weeks of travel OR . Been in close contact with a person diagnosed with COVID-19 within the last 2 weeks and have fever, cough, and shortness of breath . IF YOU DO NOT MEET THESE CRITERIA, YOU ARE CONSIDERED LOW RISK FOR COVID-19.  What to do if you are HIGH RISK for COVID-19?  Marland Kitchen If you are having a medical emergency, call 911. . Seek medical care right away. Before you go to a doctor's office, urgent care or emergency department,  call ahead and tell them about your recent travel, contact with someone diagnosed with COVID-19, and your symptoms. You should receive instructions from your physician's office regarding next steps of care.  . When you arrive at healthcare provider, tell the healthcare staff immediately you have returned from visiting [place], [place], [place], [place] or [place]; or traveled in the [place] to [place], [place], [place], or [place]; in the last two weeks or you have been in close contact with a person diagnosed with COVID-19 in the last 2 weeks.   . Tell the health care staff about your symptoms: fever, cough and shortness of breath. . After you have been seen by a medical provider, you will be either: o Tested for (COVID-19) and discharged home on quarantine except to seek medical care if symptoms worsen, and asked to  - Stay home and avoid contact with others until you get your results (4-5 days)  - Avoid travel on public transportation if possible (such as bus, train, or airplane) or o Sent to the Emergency Department by EMS for evaluation, COVID-19 testing, and possible admission depending on your condition and test results.  What to do if you are LOW RISK for COVID-19?  Reduce your risk of any infection by using the same precautions used for avoiding the common cold or flu:  Marland Kitchen Wash your hands often with soap and warm water for at least 20 seconds.  If soap and water are not readily available, use an alcohol-based hand sanitizer with at least 60% alcohol.  Marland Kitchen  If coughing or sneezing, cover your mouth and nose by coughing or sneezing into the elbow areas of your shirt or coat, into a tissue or into your sleeve (not your hands). . Avoid shaking hands with others and consider head nods or verbal greetings only. . Avoid touching your eyes, nose, or mouth with unwashed hands.  . Avoid close contact with people who are sick. . Avoid places or events with large numbers of people in one  location, like concerts or sporting events. . Carefully consider travel plans you have or are making. . If you are planning any travel outside or inside the [place], visit the CDC's Travelers' Health webpage for the latest health notices. . If you have some symptoms but not all symptoms, continue to monitor at home and seek medical attention if your symptoms worsen. . If you are having a medical emergency, call 911.   [place] / e-Visit: [email or web address]         [facility]: [facility]: [phone]                   [facility]: [phone]

## 2019-07-09 ENCOUNTER — Telehealth: Payer: Self-pay | Admitting: Oncology

## 2019-07-09 ENCOUNTER — Telehealth: Payer: Self-pay | Admitting: *Deleted

## 2019-07-09 NOTE — Telephone Encounter (Signed)
Called pt per 12/14 sch message to cancel appt - unable to reach pt - left message for patient to call back about reschedule.

## 2019-07-13 ENCOUNTER — Other Ambulatory Visit: Payer: Self-pay

## 2019-07-13 DIAGNOSIS — Z20822 Contact with and (suspected) exposure to covid-19: Secondary | ICD-10-CM

## 2019-07-14 LAB — NOVEL CORONAVIRUS, NAA: SARS-CoV-2, NAA: NOT DETECTED

## 2019-07-16 ENCOUNTER — Telehealth: Payer: Self-pay | Admitting: Oncology

## 2019-07-16 NOTE — Telephone Encounter (Signed)
Returned patient's phone call regarding cancelling 12/31 appointment, voicemail is full. Per 12/21 scheduled message appointments cancelled.   Message to provider.

## 2019-07-17 ENCOUNTER — Other Ambulatory Visit: Payer: Self-pay | Admitting: Oncology

## 2019-07-25 ENCOUNTER — Ambulatory Visit: Payer: BC Managed Care – PPO | Attending: Internal Medicine

## 2019-07-25 DIAGNOSIS — Z20822 Contact with and (suspected) exposure to covid-19: Secondary | ICD-10-CM

## 2019-07-26 ENCOUNTER — Ambulatory Visit: Payer: BC Managed Care – PPO

## 2019-07-26 ENCOUNTER — Other Ambulatory Visit: Payer: BC Managed Care – PPO

## 2019-07-26 LAB — NOVEL CORONAVIRUS, NAA: SARS-CoV-2, NAA: NOT DETECTED

## 2019-07-31 ENCOUNTER — Other Ambulatory Visit: Payer: Self-pay | Admitting: Oncology

## 2019-08-02 ENCOUNTER — Ambulatory Visit (HOSPITAL_COMMUNITY): Payer: BC Managed Care – PPO | Attending: Cardiology

## 2019-08-02 ENCOUNTER — Other Ambulatory Visit: Payer: Self-pay

## 2019-08-02 DIAGNOSIS — C50912 Malignant neoplasm of unspecified site of left female breast: Secondary | ICD-10-CM | POA: Diagnosis present

## 2019-08-02 DIAGNOSIS — Z170 Estrogen receptor positive status [ER+]: Secondary | ICD-10-CM | POA: Diagnosis present

## 2019-08-02 DIAGNOSIS — C50412 Malignant neoplasm of upper-outer quadrant of left female breast: Secondary | ICD-10-CM | POA: Diagnosis present

## 2019-08-03 NOTE — Progress Notes (Signed)
  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 She had expected skin peeling, and with encouragement, persisted through treatment until complete.  Plan: The patient will follow-up with radiation oncology in 70mo or as needed.  -----------------------------------  SEppie Gibson MD

## 2019-08-13 ENCOUNTER — Telehealth: Payer: Self-pay | Admitting: Oncology

## 2019-08-13 NOTE — Telephone Encounter (Signed)
Returned patient's phone call regarding rescheduling an appointment, left a voicemail. 

## 2019-08-15 NOTE — Telephone Encounter (Signed)
No entry 

## 2019-08-15 NOTE — Progress Notes (Signed)
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   She receives Trastuzumab/Pertuzumab every 21 days, last received on 07/05/2019.  She was supposed to have received a dose 1231 [id number] she called and canceled.  She has been tolerating anti-HER-2 immunotherapy without any major side effects and particularly diarrhea has not been a major issue.  Since her last visit, she underwent repeat echocardiogram on 08/02/2019, which showed an ejection fraction of 60-65%.  She also underwent coronavirus testing on 12/18 and 07/25/2019. Both tests were negative.  [facility] -1.6.   REVIEW OF SYSTEMS: IChrishanais taking appropriate pandemic precautions.  There has been some coronavirus disease at her work and she has been tested twice, both negative.  At home her family also she tells me is very careful.  She had significant fatigue as well as skin changes related to the radiation.  She is only just now beginning to feel more normal she says.  She complains that she "does not look the same".  She has lost quite a bit of weight, from her original 161 pounds in March 2020 to the current  134 pounds.  She likes her current weight however and feels healthy with it.  Aside from these issues a detailed review of systems today was stable   HISTORY OF CURRENT ILLNESS: From the original intake note:  Paige OZIMEKhas a prior history of left breast cancer, dating back to 2004. At that time she underwent a left mastectomy for stage 0 (noninvasive) breast cancer, with transverse rectus abdominis (TRAM) flap construction under Dr. TTowanda Foster She also underwent a right breast reduction. She took tamoxifen for three years.  More recently she [facility] showing: Breast Density Category B. There is an oval fat containing lesion in the left breast upper outer quadrant posterior depth. No other significant masses, calcifications, or other findings are seen in either breast. Sonographically, there is a 1.5 cm lesion in the left breast upper outer quadrant posterior depth. This lesion is of mixed echogenicity. This correlates as palpated and with mammography findings. Follow up was recommended.  Close follow-up was suggested.  She then presented with a non-tender mass in the left reconstructed breast on 08/30/2018. On physical exam, there is a hard palpable lump measuring 2.0 cm in the upper outer left reconstructed breast 10 cm from the expected location of a nipple. Sonography over this area demonstrates a 1.8 cm x 1.7 cm x 1.4 cm mass in the left breast at 2 o'clock posterior depth 10 cm from the nipple. This mass is of mixed echogenicity. This abnormality is increased in size and correlates as palpated and with prior mammography findings. Color flow imaging demonstrates that there is vascularity present. Elastography imaging assessment  is intermediate. No significant abnormalities were seen sonographically in the left axilla.    Accordingly on 08/30/2018 she proceeded to biopsy of the left breast  mass in question. The pathology from this procedure showed ([id number]): invasive ductal carcinoma, grade III. Prognostic indicators significant for: estrogen receptor, 100% positive with strong staining intensity and progesterone receptor, 0% negative. Proliferation marker Ki67 at 15%. HER2 positive (3+) by immunohistochemistry.  The patient's subsequent history is as detailed below.   PAST MEDICAL HISTORY: Past Medical History:  Diagnosis Date  . Anemia   . History of blood transfusion 2004  . History of colon polyps   . Hypertension   . Recurrent breast cancer, left [facility]) oncologist-- dr Paige Foster    dx 2004, noninvasive Stage 0 ----s/p left mastectomy w/ tram flap construction (and right breast reduction), taken Tamoxifen for 3 yrs;   08-30-2018 recurrent left cancer , Grade III,  cT1c,  ER positive, PR negative, HER-2 positive, invasive ductal carcinoma-- neoadjuvant chemo to start 09-26-2018  . Renal artery stenosis (HCC)    mild right external renal artery stenosis per duplex in epic 08-09-2013  . Wears glasses     PAST SURGICAL HISTORY: Past Surgical History:  Procedure Laterality Date  . BREAST LUMPECTOMY WITH RADIOACTIVE SEED LOCALIZATION Left 02/20/2019   Procedure: LEFT BREAST LUMPECTOMY WITH RADIOACTIVE SEED LOCALIZATION;  Surgeon: Paige Bookbinder, MD;  Location: [place];  Service: General;  Laterality: Left;  . BREAST SURGERY Left    Transflap  . COLONOSCOPY    . MASTECTOMY Left 2004   w/  TRAM flap construction and right breast augmentation with abdominoplasy  . PARS PLANA VITRECTOMY Left 12/18/2018   Procedure: PARS PLANA VITRECTOMY WITH 25 GAUGE, ENDOLASER;  Surgeon: Paige Mullet, MD;  Location: [place];  Service: Ophthalmology;  Laterality: Left;  . PORTACATH PLACEMENT N/A 09/25/2018   Procedure: INSERTION PORT-A-CATH WITH ULTRASOUND;  Surgeon: Paige Bookbinder, MD;  Location: [facility];  Service: General;  Laterality: N/A;  . TUBAL LIGATION Bilateral yrs ago     FAMILY HISTORY: Family History  Problem Relation Age of Onset  . Diabetes Mother   . Hypertension Mother   . Kidney disease Father   . Colon cancer Neg Hx   . Colon polyps Neg Hx   . Gallbladder disease Neg Hx   . Heart disease Neg Hx   . Esophageal cancer Neg Hx    Paige Foster's father died from unknown causes in his early 87's. Patients' mother died from diabetes complications at age 45. The patient has 1 sister. Patient denies anyone in her family having breast, ovarian, prostate, or pancreatic cancer.    GYNECOLOGIC HISTORY:  Patient's last menstrual period was 06/08/2007. 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 Her husband, Paige Foster, works at [employer]. Paige Foster has two children, Paige Foster and Paige Foster. Paige Foster lives with her, is 13, and it attending [employer] for a computer based degree. Paige Foster lives with her, is 51, and recently graduated from [employer] with a degree in Careers information officer.  He works for [employer] has no grandchildren. She attends the [employer]: In the absence of any documents to the contrary her husband, Paige Foster, is automatically her healthcare power of attorney     HEALTH MAINTENANCE: Social History   Tobacco Use  . Smoking status:  Never Smoker  . Smokeless tobacco: Never Used  Substance Use Topics  . Alcohol use: Not Currently    Alcohol/week: 0.0 standard drinks    Comment: Occassionally  . Drug use: No    Colonoscopy: yes  PAP:   Bone density: yes, 2017; -1.6, osteopenic   No Known Allergies  Current Outpatient Medications  Medication Sig Dispense Refill  . amLODipine (NORVASC) 10 MG tablet Take 1 tablet (10 mg total) by mouth daily. Need office visit for additional refills 90 tablet 3  . anastrozole (ARIMIDEX) 1 MG tablet Take 1 tablet (1 mg  total) by mouth daily. 90 tablet 4  . Ascorbic Acid (VITAMIN C) 1000 MG tablet Take 1,000 mg by mouth daily.    . Flaxseed, Linseed, (FLAXSEED OIL) 1000 MG CAPS Take 1,000 mg by mouth daily.    Marland Kitchen LORazepam (ATIVAN) 0.5 MG tablet Take 1 tablet (0.5 mg total) by mouth every 8 (eight) hours as needed for anxiety or sleep. 30 tablet 0  . losartan (COZAAR) 50 MG tablet Take 1 tablet (50 mg total) by mouth daily. Need office visit for additional refills 90 tablet 3  . Polyethyl Glycol-Propyl Glycol (SYSTANE) 0.4-0.3 % SOLN Place 1 drop into both eyes 4 (four) times daily as needed (for dry eyes).    . prednisoLONE acetate (PRED FORTE) 1 % ophthalmic suspension Place 1 drop into the left eye 4 (four) times daily.     No current facility-administered medications for this visit.     OBJECTIVE: Middle-aged African-American woman in no acute distress  There were no vitals filed for this visit.   There is no height or weight on file to calculate BMI.   Wt Readings from Last 3 Encounters:  07/05/19 131 lb (59.4 kg)  06/14/19 133 lb 4.8 oz (60.5 kg)  05/24/19 131 lb 8 oz (59.6 kg)  ECOG FS:1  Sclerae unicteric, EOMs intact Wearing a mask No cervical or supraclavicular adenopathy Lungs no rales or rhonchi Heart regular rate and rhythm Abd soft, nontender, positive bowel sounds MSK no focal spinal tenderness, no upper extremity lymphedema Neuro: nonfocal, well oriented, appropriate affect Breasts: The right breast is unremarkable.  The left breast is status post mastectomy with TRAM reconstruction.  It is also status post radiation, with significant residual hyperpigmentation.  There is no evidence of disease recurrence.  Both axillae are benign.  LAB RESULTS:  CMP     Component Value Date/Time   NA 140 07/04/2019 1511   NA 140 10/12/2017 0950   K 3.5 07/04/2019 1511   CL 105 07/04/2019 1511   CO2 24 07/04/2019 1511   GLUCOSE 97 07/04/2019 1511   BUN 8 07/04/2019 1511   BUN 10 10/12/2017  0950   CREATININE 0.70 07/04/2019 1511   CREATININE 0.78 09/07/2018 1455   CALCIUM 9.5 07/04/2019 1511   PROT 7.4 07/04/2019 1511   PROT 7.5 10/12/2017 0950   ALBUMIN 4.2 07/04/2019 1511   ALBUMIN 4.3 10/12/2017 0950   AST 18 07/04/2019 1511   AST 14 (L) 09/07/2018 1455   ALT 17 07/04/2019 1511   ALT 13 09/07/2018 1455   ALKPHOS 83 07/04/2019 1511   BILITOT 0.3 07/04/2019 1511   BILITOT 0.2 (L) 09/07/2018 1455   GFRNONAA >60 07/04/2019 1511   GFRNONAA >60 09/07/2018 1455   GFRAA >60 07/04/2019 1511   GFRAA >60 09/07/2018 1455    No results found for: TOTALPROTELP, ALBUMINELP, A1GS, A2GS, BETS, BETA2SER, GAMS, MSPIKE, SPEI  No results found for: KPAFRELGTCHN, LAMBDASER,  [facility]  Lab Results  Component Value Date   WBC 4.5 07/04/2019   NEUTROABS 2.7 07/04/2019   HGB 11.8 (L) 07/04/2019   HCT 35.1 (L) 07/04/2019   MCV 96.4 07/04/2019   PLT 245 07/04/2019    Lab Results  Component Value Date   LABCA2 <4 08/30/2007    No components found for: [id number]  No results for input(s): INR in the last 168 hours.  Lab Results  Component Value Date   LABCA2 <4 08/30/2007    No results found for: YQM250  No results found for: IBB048  No results found for: GQB169  No results found for: CA2729  No components found for: HGQUANT  No results found for: CEA1 / No results found for: CEA1   No results found for: AFPTUMOR  No results found for: [place]  No results found for: PSA1  No visits with results within 3 Day(s) from this visit.  Latest known visit with results is:  Lab on 07/25/2019  Component Date Value Ref Range Status  . SARS-CoV-2, NAA 07/25/2019 Not Detected  Not Detected Final   Comment: This nucleic acid amplification test was developed and its performance characteristics determined by [employer], [employer] and [employer]. Nucleic acid amplification tests include PCR and TMA. This test has not been FDA cleared or approved. This test has been authorized by FDA  under an Emergency Use Authorization (EUA). This test is only authorized for the duration of time the declaration that circumstances exist justifying the authorization of the emergency use of in vitro diagnostic tests for detection of SARS-CoV-2 virus and/or diagnosis of COVID-19 infection under section 564(b)(1) of the Act, 21 U.S.C. 450TUU-8(K) (1), unless the authorization is terminated or revoked sooner. When diagnostic testing is negative, the possibility of a false negative result should be considered in the context of a patient's recent exposures and the presence of clinical signs and symptoms consistent with COVID-19. An individual without symptoms of COVID-19 and who is not shedding SARS-CoV-2 virus would                           expect to have a negative (not detected) result in this assay.     (this displays the last labs from the last 3 days)  No results found for: TOTALPROTELP, ALBUMINELP, A1GS, A2GS, BETS, BETA2SER, GAMS, MSPIKE, SPEI (this displays SPEP labs)  No results found for: KPAFRELGTCHN, LAMBDASER, KAPLAMBRATIO (kappa/lambda light chains)  No results found for: HGBA, HGBA2QUANT, HGBFQUANT, HGBSQUAN (Hemoglobinopathy evaluation)   Lab Results  Component Value Date   LDH 169 08/30/2007    Lab Results  Component Value Date   IRON 78 01/01/2009   TIBC 424 08/30/2007   IRONPCTSAT 17.9 (L) 01/01/2009   (Iron and TIBC)  Lab Results  Component Value Date   FERRITIN 19 08/30/2007    Urinalysis    Component Value Date/Time   LABSPEC 1.015 04/09/2008 1548   PHURINE 7.0 04/09/2008 1548   HGBUR large 04/09/2008 1548   BILIRUBINUR negative 04/09/2008 1548   UROBILINOGEN 0.2 04/09/2008 1548   NITRITE negative 04/09/2008 1548     STUDIES:  ECHOCARDIOGRAM COMPLETE  Result Date: 08/03/2019   ECHOCARDIOGRAM REPORT   Patient Name:   YUMI INSALACO Date of Exam: 08/02/2019 Medical Rec #:  [id number]       Height:       64.0 in Accession #:    [id number]       Weight:  131.0 lb Date of Birth:  1966-05-12       BSA:          1.63 m Patient Age:    54 years        BP:           112/65 mmHg Patient Gender: F               HR:           62 bpm. Exam Location:  [place] Procedure: 2D Echo, 3D Echo, Cardiac Doppler, Color Doppler and Strain Analysis Indications:    Z09 Chemotherapy  History:        Patient has prior history of Echocardiogram examinations, most                 recent 04/24/2019. Risk Factors:Hypertension. Left Breast Cancer                 with Mastectomy and Left Tramflap reconstruction (2004),                 Re[place]ant Left Breast Cancer (started Chemo March 2020).  Sonographer:    Deliah Boston RDCS Referring Phys: [place]  1. Left ventricular ejection fraction, by visual estimation, is 60 to 65%. The left ventricle has normal function. There is no left ventricular hypertrophy. Normal diastolic function  2. The average left ventricular global longitudinal strain is -19.7 %.  3. Global right ventricle has normal systolic function.The right ventricular size is mildly enlarged. No increase in right ventricular wall thickness.  4. Left atrial size was mildly dilated.  5. Right atrial size was mildly dilated.  6. The mitral valve is normal in structure. No evidence of mitral valve regurgitation. No evidence of mitral stenosis.  7. The tricuspid valve is normal in structure.  8. The aortic valve is tricuspid. Aortic valve regurgitation is not visualized. No evidence of aortic valve sclerosis or stenosis.  9. The pulmonic valve was not well visualized. Pulmonic valve regurgitation is trivial. 10. The tricuspid regurgitant velocity is 2.28 m/s, and with an assumed right atrial pressure of 3 mmHg, the estimated right ventricular systolic pressure is normal at 23.8 mmHg. 11. The inferior vena cava is normal in size with greater than 50% respiratory variability, suggesting right atrial pressure of 3 mmHg. FINDINGS  Left  Ventricle: Left ventricular ejection fraction, by visual estimation, is 60 to 65%. The left ventricle has normal function. The average left ventricular global longitudinal strain is -19.7 %. The left ventricle has no regional wall motion abnormalities. There is no left ventricular hypertrophy. Left ventricular diastolic parameters were normal. Normal left atrial pressure. Right Ventricle: The right ventricular size is mildly enlarged. No increase in right ventricular wall thickness. Global RV systolic function is has normal systolic function. The tricuspid regurgitant velocity is 2.28 m/s, and with an assumed right atrial  pressure of 3 mmHg, the estimated right ventricular systolic pressure is normal at 23.8 mmHg. Left Atrium: Left atrial size was mildly dilated. Right Atrium: Right atrial size was mildly dilated Pericardium: There is no evidence of pericardial effusion. Mitral Valve: The mitral valve is normal in structure. No evidence of mitral valve regurgitation. No evidence of mitral valve stenosis by observation. Tricuspid Valve: The tricuspid valve is normal in structure. Tricuspid valve regurgitation is trivial. Aortic Valve: The aortic valve is tricuspid. Aortic valve regurgitation is not visualized. The aortic valve is structurally normal, with no evidence of sclerosis or stenosis. Pulmonic Valve:  The pulmonic valve was not well visualized. Pulmonic valve regurgitation is trivial. Pulmonic regurgitation is trivial. Aorta: The aortic root and ascending aorta are structurally normal, with no evidence of dilitation. Venous: The inferior vena cava is normal in size with greater than 50% respiratory variability, suggesting right atrial pressure of 3 mmHg. IAS/Shunts: The interatrial septum was not well visualized.  LEFT VENTRICLE PLAX 2D LVIDd:         4.54 cm  Diastology LVIDs:         2.94 cm  LV e' lateral:   16.00 cm/s LV PW:         0.86 cm  LV E/e' lateral: 5.3 LV IVS:        0.59 cm  LV e' medial:     11.50 cm/s LVOT diam:     2.20 cm  LV E/e' medial:  7.4 LV SV:         61 ml LV SV Index:   37.18    2D Longitudinal Strain LVOT Area:     3.80 cm 2D Strain GLS Avg:     -19.7 %  RIGHT VENTRICLE RV S prime:     17.50 cm/s TAPSE (M-mode): 3.0 cm LEFT ATRIUM             Index       RIGHT ATRIUM           Index LA diam:        3.40 cm 2.08 cm/m  RA Area:     17.90 cm LA Vol (A2C):   43.5 ml 26.62 ml/m RA Volume:   47.30 ml  28.94 ml/m LA Vol (A4C):   68.4 ml 41.85 ml/m LA Biplane Vol: 56.9 ml 34.81 ml/m  AORTIC VALVE LVOT Vmax:   112.00 cm/s LVOT Vmean:  64.600 cm/s LVOT VTI:    0.251 m  AORTA Ao Root diam: 2.90 cm Ao Asc diam:  2.80 cm MITRAL VALVE                        TRICUSPID VALVE MV Area (PHT): cm                  TR Peak grad:   20.8 mmHg MV PHT:        msec                 TR Vmax:        228.00 cm/s MV Decel Time: 282 msec MV E velocity: 84.90 cm/s 103 cm/s  SHUNTS MV A velocity: 64.50 cm/s 70.3 cm/s Systemic VTI:  0.25 m MV E/A ratio:  1.32       1.5       Systemic Diam: 2.20 cm  Oswaldo Milian MD Electronically signed by Oswaldo Milian MD Signature Date/Time: 08/03/2019/12:55:02 AM    Final     ELIGIBLE FOR AVAILABLE RESEARCH PROTOCOL: No   ASSESSMENT: 54 y.o. [place], [place] woman  (1) history of left-sided ductal carcinoma in situ 2004  (a) s/p left mastectomy with TRAM reconstruction  (b) status post tamoxifen x3 years  (2) left breast upper outer quadrant biopsy 08/30/2018 shows a clinical T1c N0 invasive ductal carcinoma, grade 3, estrogen receptor strongly positive, progesterone receptor negative, with HER-2 amplification, and and MIB-1 of 15%.   (a) staging CT scan of the chest with contrast 09/14/2018 showed no evidence of metastatic disease  (3) neoadjuvant chemotherapy consisting of carboplatin, docetaxel, trastuzumab and Pertuzumab starting 09/26/2018, repeated every  21 days x 6, last dose 09/06/2019  (a) Docetaxel changed to Gemcitabine starting with cycle 4 due  to lacrimal duct stenosis, and neuropathy.    (b) chemotherapy discontinued after 4 cycles because of intercurrent eye surgery  (c) continuing trastuzumab and Pertuzumab to complete a year (through February 2021)  (d) echocardiogram on 01/23/2019 that shows well preserved EF of 60-65%  (e) echocardiogram on 04/23/2019 shows EF of 60-65%  (f) echocardiogram 08/02/2019 shows an ejection fraction in the 60-65% range  (4) left lumpectomy 02/20/2019 showed a residual  ypT1c NX invasive ductal carcinoma, grade 2 with negative margins.    (5) adjuvant radiation: Radiation Treatment Dates: 04/12/2019 through 05/28/2019 Site Technique Total Dose (Gy) Dose per Fx (Gy) Completed Fx Beam Energies  Breast: CW_Lt 3D 50.4/50.4 1.8 28/28 6X, 10X  Breast: CW_Lt_SCV_PAB 3D 50.4/50.4 1.8 28/28 6X, 10X  Breast: CW_Lt_Bst Electron 10/10 2 5/5 6X, 10X   (6) anastrozole started 06/26/2019  (a) DEXA scan at [facility] 12/24/2015 found a Paige score of -1.6  (7) genetics testing 02/03/2019 through the Common Hereditary Cancers Panel offered by [facility] found no deleterious mutations in APC, ATM, AXIN2, BARD1, BMPR1A, BRCA1, BRCA2, BRIP1, CDH1, CDKN2A (p14ARF), CDKN2A (p16INK4a), CKD4, CHEK2, CTNNA1, DICER1, EPCAM (Deletion/duplication testing only), GREM1 (promoter region deletion/duplication testing only), KIT, MEN1, MLH1, MSH2, MSH3, MSH6, MUTYH, NBN, NF1, NHTL1, PALB2, PDGFRA, PMS2, POLD1, POLE, PTEN, RAD50, RAD51C, RAD51D, RNF43, SDHB, SDHC, SDHD, SMAD4, SMARCA4. STK11, TP53, TSC1, TSC2, and VHL.  The following genes were evaluated for sequence changes only: SDHA and HOXB13 c.251G>A variant only.  PLAN: Victoriya has lost quite a bit of weight and actually likes the weight that she has now but as a result of that her face is a little withdrawn.  She thought this had something to do with her white cells and therefore was reluctant to receive treatment.  We explained of the whole issue regarding the fact under the skin and how that  affects wrinkles.  Even though she may look a little older she is actually healthier with the current weight.  She will be treated today and again in 3 weeks.  We had originally scheduled her to be treated in March for last treatment but she understood February would be the last 1 and we are going to go without understanding.  She is tolerating anastrozole well and the main issue will be to continue that a total of 5 years.  She will benefit from a bone density with her next set of mammograms.  She is already scheduled to see me again in April.  Hopefully by then she will already have had her vaccines for the Covid 19 disease.  At that point we will discuss long-term follow-up issues  Total encounter time 30 minutes.*   [facility] [place], [place] [address] Tel. [phone]    Fax. [phone]   I, Wilburn Mylar, am acting as scribe for Dr. Virgie Foster. Kylieann Eagles.  I, Lurline Del MD, have reviewed the above documentation for accuracy and completeness, and I agree with the above.   *Total Encounter Time as defined by the Centers for Medicare and Medicaid Services includes, in addition to the face-to-face time of a patient visit (documented in the note above) non-face-to-face time: obtaining and reviewing outside history, ordering and reviewing medications, tests or procedures, care coordination (communications with other health care professionals or caregivers) and documentation in the medical record.

## 2019-08-16 ENCOUNTER — Inpatient Hospital Stay (HOSPITAL_BASED_OUTPATIENT_CLINIC_OR_DEPARTMENT_OTHER): Payer: BC Managed Care – PPO | Admitting: Oncology

## 2019-08-16 ENCOUNTER — Inpatient Hospital Stay: Payer: BC Managed Care – PPO | Attending: Oncology

## 2019-08-16 ENCOUNTER — Encounter: Payer: Self-pay | Admitting: General Practice

## 2019-08-16 ENCOUNTER — Other Ambulatory Visit: Payer: Self-pay

## 2019-08-16 ENCOUNTER — Inpatient Hospital Stay: Payer: BC Managed Care – PPO

## 2019-08-16 VITALS — BP 115/51 | HR 65 | Temp 98.5°F | Resp 18 | Ht 64.0 in | Wt 133.5 lb

## 2019-08-16 DIAGNOSIS — Z79811 Long term (current) use of aromatase inhibitors: Secondary | ICD-10-CM | POA: Insufficient documentation

## 2019-08-16 DIAGNOSIS — Z170 Estrogen receptor positive status [ER+]: Secondary | ICD-10-CM

## 2019-08-16 DIAGNOSIS — M8580 Other specified disorders of bone density and structure, unspecified site: Secondary | ICD-10-CM

## 2019-08-16 DIAGNOSIS — C50912 Malignant neoplasm of unspecified site of left female breast: Secondary | ICD-10-CM

## 2019-08-16 DIAGNOSIS — Z95828 Presence of other vascular implants and grafts: Secondary | ICD-10-CM

## 2019-08-16 DIAGNOSIS — C50412 Malignant neoplasm of upper-outer quadrant of left female breast: Secondary | ICD-10-CM | POA: Diagnosis not present

## 2019-08-16 DIAGNOSIS — Z5111 Encounter for antineoplastic chemotherapy: Secondary | ICD-10-CM | POA: Diagnosis not present

## 2019-08-16 DIAGNOSIS — Z79899 Other long term (current) drug therapy: Secondary | ICD-10-CM | POA: Insufficient documentation

## 2019-08-16 LAB — CBC WITH DIFFERENTIAL/PLATELET
Abs Immature Granulocytes: 0.01 10*3/uL (ref 0.00–0.07)
Basophils Absolute: 0 10*3/uL (ref 0.0–0.1)
Basophils Relative: 1 %
Eosinophils Absolute: 0 10*3/uL (ref 0.0–0.5)
Eosinophils Relative: 1 %
HCT: 35.7 % — ABNORMAL LOW (ref 36.0–46.0)
Hemoglobin: 11.8 g/dL — ABNORMAL LOW (ref 12.0–15.0)
Immature Granulocytes: 0 %
Lymphocytes Relative: 29 %
Lymphs Abs: 1.2 10*3/uL (ref 0.7–4.0)
MCH: 32.1 pg (ref 26.0–34.0)
MCHC: 33.1 g/dL (ref 30.0–36.0)
MCV: 97 fL (ref 80.0–100.0)
Monocytes Absolute: 0.3 10*3/uL (ref 0.1–1.0)
Monocytes Relative: 8 %
Neutro Abs: 2.6 10*3/uL (ref 1.7–7.7)
Neutrophils Relative %: 61 %
Platelets: 224 10*3/uL (ref 150–400)
RBC: 3.68 MIL/uL — ABNORMAL LOW (ref 3.87–5.11)
RDW: 12.1 % (ref 11.5–15.5)
WBC: 4.2 10*3/uL (ref 4.0–10.5)
nRBC: 0 % (ref 0.0–0.2)

## 2019-08-16 LAB — COMPREHENSIVE METABOLIC PANEL
ALT: 10 U/L (ref 0–44)
AST: 12 U/L — ABNORMAL LOW (ref 15–41)
Albumin: 3.8 g/dL (ref 3.5–5.0)
Alkaline Phosphatase: 80 U/L (ref 38–126)
Anion gap: 11 (ref 5–15)
BUN: 12 mg/dL (ref 6–20)
CO2: 24 mmol/L (ref 22–32)
Calcium: 9.2 mg/dL (ref 8.9–10.3)
Chloride: 105 mmol/L (ref 98–111)
Creatinine, Ser: 0.71 mg/dL (ref 0.44–1.00)
GFR calc Af Amer: >60 mL/min (ref >60)
GFR calc non Af Amer: >60 mL/min (ref >60)
Glucose, Bld: 152 mg/dL — ABNORMAL HIGH (ref 70–99)
Potassium: 3.8 mmol/L (ref 3.5–5.1)
Sodium: 140 mmol/L (ref 135–145)
Total Bilirubin: <0.2 mg/dL — ABNORMAL LOW (ref 0.3–1.2)
Total Protein: 6.8 g/dL (ref 6.5–8.1)

## 2019-08-16 MED ORDER — ACETAMINOPHEN 325 MG PO TABS
650.0000 mg | ORAL_TABLET | Freq: Once | ORAL | Status: AC
  Administered 2019-08-16: 14:00:00 650 mg via ORAL

## 2019-08-16 MED ORDER — SODIUM CHLORIDE 0.9% FLUSH
10.0000 mL | Freq: Once | INTRAVENOUS | Status: AC
  Administered 2019-08-16: 10 mL
  Filled 2019-08-16: qty 10

## 2019-08-16 MED ORDER — SODIUM CHLORIDE 0.9 % IV SOLN
Freq: Once | INTRAVENOUS | Status: AC
  Filled 2019-08-16: qty 250

## 2019-08-16 MED ORDER — DIPHENHYDRAMINE HCL 25 MG PO CAPS
25.0000 mg | ORAL_CAPSULE | Freq: Once | ORAL | Status: AC
  Administered 2019-08-16: 25 mg via ORAL

## 2019-08-16 MED ORDER — HEPARIN SOD (PORK) LOCK FLUSH 100 UNIT/ML IV SOLN
500.0000 [IU] | Freq: Once | INTRAVENOUS | Status: AC | PRN
  Administered 2019-08-16: 500 [IU]
  Filled 2019-08-16: qty 5

## 2019-08-16 MED ORDER — DIPHENHYDRAMINE HCL 25 MG PO CAPS
ORAL_CAPSULE | ORAL | Status: AC
  Filled 2019-08-16: qty 1

## 2019-08-16 MED ORDER — SODIUM CHLORIDE 0.9% FLUSH
10.0000 mL | INTRAVENOUS | Status: DC | PRN
  Administered 2019-08-16: 10 mL
  Filled 2019-08-16: qty 10

## 2019-08-16 MED ORDER — ACETAMINOPHEN 325 MG PO TABS
ORAL_TABLET | ORAL | Status: AC
  Filled 2019-08-16: qty 2

## 2019-08-16 MED ORDER — TRASTUZUMAB-ANNS CHEMO 150 MG IV SOLR
6.0000 mg/kg | Freq: Once | INTRAVENOUS | Status: AC
  Administered 2019-08-16: 357 mg via INTRAVENOUS
  Filled 2019-08-16: qty 17

## 2019-08-16 MED ORDER — SODIUM CHLORIDE 0.9 % IV SOLN
420.0000 mg | Freq: Once | INTRAVENOUS | Status: AC
  Administered 2019-08-16: 420 mg via INTRAVENOUS
  Filled 2019-08-16: qty 14

## 2019-08-16 NOTE — Patient Instructions (Signed)
[facility] Discharge Instructions for Patients Receiving Chemotherapy  Today you received the following chemotherapy agents Trastuzumab and Perjeta  To help prevent nausea and vomiting after your treatment, we encourage you to take your nausea medication as directed.    If you develop nausea and vomiting that is not controlled by your nausea medication, call the clinic.   BELOW ARE SYMPTOMS THAT SHOULD BE REPORTED IMMEDIATELY:  *FEVER GREATER THAN 100.5 F  *CHILLS WITH OR WITHOUT FEVER  NAUSEA AND VOMITING THAT IS NOT CONTROLLED WITH YOUR NAUSEA MEDICATION  *UNUSUAL SHORTNESS OF BREATH  *UNUSUAL BRUISING OR BLEEDING  TENDERNESS IN MOUTH AND THROAT WITH OR WITHOUT PRESENCE OF ULCERS  *URINARY PROBLEMS  *BOWEL PROBLEMS  UNUSUAL RASH Items with * indicate a potential emergency and should be followed up as soon as possible.  Feel free to call the clinic should you have any questions or concerns. The clinic phone number is [phone].  Please show the CHEMO ALERT CARD at check-in to the Emergency Department and triage nurse.   

## 2019-08-16 NOTE — Progress Notes (Signed)
[facility] CSW Progress Notes  Additional documents sent by secure email to [place] at patient request.  Edwyna Shell, LCSW Clinical Social Worker Phone:  [phone]

## 2019-08-16 NOTE — Progress Notes (Signed)
Patient declined to stay for post-observation period after Perjeta

## 2019-09-05 ENCOUNTER — Other Ambulatory Visit: Payer: Self-pay

## 2019-09-05 ENCOUNTER — Encounter: Payer: Self-pay | Admitting: *Deleted

## 2019-09-05 ENCOUNTER — Inpatient Hospital Stay: Payer: BC Managed Care – PPO | Attending: Oncology

## 2019-09-05 ENCOUNTER — Inpatient Hospital Stay: Payer: BC Managed Care – PPO

## 2019-09-05 DIAGNOSIS — Z79811 Long term (current) use of aromatase inhibitors: Secondary | ICD-10-CM | POA: Diagnosis not present

## 2019-09-05 DIAGNOSIS — C50412 Malignant neoplasm of upper-outer quadrant of left female breast: Secondary | ICD-10-CM | POA: Insufficient documentation

## 2019-09-05 DIAGNOSIS — Z170 Estrogen receptor positive status [ER+]: Secondary | ICD-10-CM

## 2019-09-05 DIAGNOSIS — Z95828 Presence of other vascular implants and grafts: Secondary | ICD-10-CM

## 2019-09-05 DIAGNOSIS — C50912 Malignant neoplasm of unspecified site of left female breast: Secondary | ICD-10-CM

## 2019-09-05 DIAGNOSIS — Z79899 Other long term (current) drug therapy: Secondary | ICD-10-CM | POA: Insufficient documentation

## 2019-09-05 DIAGNOSIS — Z5111 Encounter for antineoplastic chemotherapy: Secondary | ICD-10-CM | POA: Insufficient documentation

## 2019-09-05 LAB — CBC WITH DIFFERENTIAL/PLATELET
Abs Immature Granulocytes: 0 10*3/uL (ref 0.00–0.07)
Basophils Absolute: 0 10*3/uL (ref 0.0–0.1)
Basophils Relative: 1 %
Eosinophils Absolute: 0 10*3/uL (ref 0.0–0.5)
Eosinophils Relative: 1 %
HCT: 33 % — ABNORMAL LOW (ref 36.0–46.0)
Hemoglobin: 11.1 g/dL — ABNORMAL LOW (ref 12.0–15.0)
Immature Granulocytes: 0 %
Lymphocytes Relative: 37 %
Lymphs Abs: 1.3 10*3/uL (ref 0.7–4.0)
MCH: 32.5 pg (ref 26.0–34.0)
MCHC: 33.6 g/dL (ref 30.0–36.0)
MCV: 96.5 fL (ref 80.0–100.0)
Monocytes Absolute: 0.4 10*3/uL (ref 0.1–1.0)
Monocytes Relative: 10 %
Neutro Abs: 1.8 10*3/uL (ref 1.7–7.7)
Neutrophils Relative %: 51 %
Platelets: 203 10*3/uL (ref 150–400)
RBC: 3.42 MIL/uL — ABNORMAL LOW (ref 3.87–5.11)
RDW: 11.9 % (ref 11.5–15.5)
WBC: 3.6 10*3/uL — ABNORMAL LOW (ref 4.0–10.5)
nRBC: 0 % (ref 0.0–0.2)

## 2019-09-05 LAB — COMPREHENSIVE METABOLIC PANEL
ALT: 14 U/L (ref 0–44)
AST: 14 U/L — ABNORMAL LOW (ref 15–41)
Albumin: 3.9 g/dL (ref 3.5–5.0)
Alkaline Phosphatase: 77 U/L (ref 38–126)
Anion gap: 8 (ref 5–15)
BUN: 11 mg/dL (ref 6–20)
CO2: 27 mmol/L (ref 22–32)
Calcium: 9 mg/dL (ref 8.9–10.3)
Chloride: 105 mmol/L (ref 98–111)
Creatinine, Ser: 0.65 mg/dL (ref 0.44–1.00)
GFR calc Af Amer: >60 mL/min (ref >60)
GFR calc non Af Amer: >60 mL/min (ref >60)
Glucose, Bld: 94 mg/dL (ref 70–99)
Potassium: 3.7 mmol/L (ref 3.5–5.1)
Sodium: 140 mmol/L (ref 135–145)
Total Bilirubin: <0.2 mg/dL — ABNORMAL LOW (ref 0.3–1.2)
Total Protein: 6.8 g/dL (ref 6.5–8.1)

## 2019-09-05 MED ORDER — SODIUM CHLORIDE 0.9% FLUSH
10.0000 mL | Freq: Once | INTRAVENOUS | Status: AC
  Administered 2019-09-05: 10 mL
  Filled 2019-09-05: qty 10

## 2019-09-06 ENCOUNTER — Other Ambulatory Visit: Payer: Self-pay

## 2019-09-06 ENCOUNTER — Inpatient Hospital Stay: Payer: BC Managed Care – PPO

## 2019-09-06 ENCOUNTER — Other Ambulatory Visit: Payer: BC Managed Care – PPO

## 2019-09-06 VITALS — BP 125/87 | HR 64 | Temp 98.2°F | Resp 18 | Wt 136.8 lb

## 2019-09-06 DIAGNOSIS — Z170 Estrogen receptor positive status [ER+]: Secondary | ICD-10-CM

## 2019-09-06 DIAGNOSIS — C50412 Malignant neoplasm of upper-outer quadrant of left female breast: Secondary | ICD-10-CM | POA: Diagnosis not present

## 2019-09-06 MED ORDER — SODIUM CHLORIDE 0.9% FLUSH
10.0000 mL | INTRAVENOUS | Status: DC | PRN
  Administered 2019-09-06: 10 mL
  Filled 2019-09-06: qty 10

## 2019-09-06 MED ORDER — ACETAMINOPHEN 325 MG PO TABS
ORAL_TABLET | ORAL | Status: AC
  Filled 2019-09-06: qty 2

## 2019-09-06 MED ORDER — ACETAMINOPHEN 325 MG PO TABS
650.0000 mg | ORAL_TABLET | Freq: Once | ORAL | Status: AC
  Administered 2019-09-06: 15:00:00 650 mg via ORAL

## 2019-09-06 MED ORDER — DIPHENHYDRAMINE HCL 25 MG PO CAPS
25.0000 mg | ORAL_CAPSULE | Freq: Once | ORAL | Status: AC
  Administered 2019-09-06: 25 mg via ORAL

## 2019-09-06 MED ORDER — SODIUM CHLORIDE 0.9 % IV SOLN
420.0000 mg | Freq: Once | INTRAVENOUS | Status: AC
  Administered 2019-09-06: 16:00:00 420 mg via INTRAVENOUS
  Filled 2019-09-06: qty 14

## 2019-09-06 MED ORDER — HEPARIN SOD (PORK) LOCK FLUSH 100 UNIT/ML IV SOLN
500.0000 [IU] | Freq: Once | INTRAVENOUS | Status: AC | PRN
  Administered 2019-09-06: 500 [IU]
  Filled 2019-09-06: qty 5

## 2019-09-06 MED ORDER — DIPHENHYDRAMINE HCL 25 MG PO CAPS
ORAL_CAPSULE | ORAL | Status: AC
  Filled 2019-09-06: qty 1

## 2019-09-06 MED ORDER — TRASTUZUMAB-ANNS CHEMO 150 MG IV SOLR
6.0000 mg/kg | Freq: Once | INTRAVENOUS | Status: AC
  Administered 2019-09-06: 357 mg via INTRAVENOUS
  Filled 2019-09-06: qty 17

## 2019-09-06 MED ORDER — SODIUM CHLORIDE 0.9 % IV SOLN
Freq: Once | INTRAVENOUS | Status: AC
  Filled 2019-09-06: qty 250

## 2019-09-06 NOTE — Patient Instructions (Signed)
Cancer Center Discharge Instructions for Patients Receiving Chemotherapy  Today you received the following chemotherapy agents Trastuzumab and Perjeta  To help prevent nausea and vomiting after your treatment, we encourage you to take your nausea medication as directed.    If you develop nausea and vomiting that is not controlled by your nausea medication, call the clinic.   BELOW ARE SYMPTOMS THAT SHOULD BE REPORTED IMMEDIATELY:  *FEVER GREATER THAN 100.5 F  *CHILLS WITH OR WITHOUT FEVER  NAUSEA AND VOMITING THAT IS NOT CONTROLLED WITH YOUR NAUSEA MEDICATION  *UNUSUAL SHORTNESS OF BREATH  *UNUSUAL BRUISING OR BLEEDING  TENDERNESS IN MOUTH AND THROAT WITH OR WITHOUT PRESENCE OF ULCERS  *URINARY PROBLEMS  *BOWEL PROBLEMS  UNUSUAL RASH Items with * indicate a potential emergency and should be followed up as soon as possible.  Feel free to call the clinic should you have any questions or concerns. The clinic phone number is [phone].  Please show the CHEMO ALERT CARD at check-in to the Emergency Department and triage nurse.   

## 2019-09-12 ENCOUNTER — Telehealth: Payer: Self-pay | Admitting: General Practice

## 2019-09-12 NOTE — Telephone Encounter (Signed)
[facility] CSW Progress Notes  [facility] [place] is requesting copies of unpaid medical bills from patient - they have approved her for a grant to pay a portion of her medical debt for breast cancer.  Patient has provided on EOBs, not bills.  CSW called billing - they are mailing patient copies of her outstanding bills.  Patient can then provide these bills either to [facility] [place] directly or bring to [facility] and we will assist her in submitting these. VM left for patient to this effect.  Edwyna Shell, LCSW Clinical Social Worker Phone:  [phone] Cell:  [phone]

## 2019-09-19 ENCOUNTER — Telehealth: Payer: Self-pay | Admitting: General Practice

## 2019-09-19 NOTE — Telephone Encounter (Signed)
[facility] CSW Progress Notes  Call from patient, she has bills to bring [place] for submission to [facility] [place].  Advised she can bring them to our office of fax them in.  Edwyna Shell, LCSW Clinical Social Worker Phone:  [phone] Cell:  [phone]

## 2019-09-24 ENCOUNTER — Encounter: Payer: BC Managed Care – PPO | Admitting: Nurse Practitioner

## 2019-10-09 ENCOUNTER — Encounter: Payer: BC Managed Care – PPO | Admitting: Nurse Practitioner

## 2019-10-23 ENCOUNTER — Telehealth: Payer: Self-pay | Admitting: Oncology

## 2019-10-23 NOTE — Telephone Encounter (Signed)
Called pt per 3/30 sch message- no answer - left message for pt to call back

## 2019-11-08 ENCOUNTER — Inpatient Hospital Stay: Payer: BC Managed Care – PPO | Admitting: Oncology

## 2019-11-08 ENCOUNTER — Inpatient Hospital Stay: Payer: BC Managed Care – PPO

## 2019-12-06 ENCOUNTER — Inpatient Hospital Stay (HOSPITAL_BASED_OUTPATIENT_CLINIC_OR_DEPARTMENT_OTHER): Payer: BC Managed Care – PPO | Admitting: Oncology

## 2019-12-06 ENCOUNTER — Inpatient Hospital Stay: Payer: BC Managed Care – PPO | Attending: Oncology

## 2019-12-06 ENCOUNTER — Other Ambulatory Visit: Payer: Self-pay

## 2019-12-06 VITALS — BP 122/73 | HR 54 | Temp 98.3°F | Resp 18 | Ht 64.0 in | Wt 134.4 lb

## 2019-12-06 DIAGNOSIS — C50912 Malignant neoplasm of unspecified site of left female breast: Secondary | ICD-10-CM

## 2019-12-06 DIAGNOSIS — C50412 Malignant neoplasm of upper-outer quadrant of left female breast: Secondary | ICD-10-CM

## 2019-12-06 DIAGNOSIS — Z79811 Long term (current) use of aromatase inhibitors: Secondary | ICD-10-CM | POA: Insufficient documentation

## 2019-12-06 DIAGNOSIS — Z170 Estrogen receptor positive status [ER+]: Secondary | ICD-10-CM | POA: Diagnosis not present

## 2019-12-06 DIAGNOSIS — Z9221 Personal history of antineoplastic chemotherapy: Secondary | ICD-10-CM | POA: Diagnosis not present

## 2019-12-06 LAB — CBC WITH DIFFERENTIAL/PLATELET
Abs Immature Granulocytes: 0 10*3/uL (ref 0.00–0.07)
Basophils Absolute: 0 10*3/uL (ref 0.0–0.1)
Basophils Relative: 1 %
Eosinophils Absolute: 0 10*3/uL (ref 0.0–0.5)
Eosinophils Relative: 1 %
HCT: 36.3 % (ref 36.0–46.0)
Hemoglobin: 11.9 g/dL — ABNORMAL LOW (ref 12.0–15.0)
Immature Granulocytes: 0 %
Lymphocytes Relative: 33 %
Lymphs Abs: 1.4 10*3/uL (ref 0.7–4.0)
MCH: 32 pg (ref 26.0–34.0)
MCHC: 32.8 g/dL (ref 30.0–36.0)
MCV: 97.6 fL (ref 80.0–100.0)
Monocytes Absolute: 0.5 10*3/uL (ref 0.1–1.0)
Monocytes Relative: 11 %
Neutro Abs: 2.4 10*3/uL (ref 1.7–7.7)
Neutrophils Relative %: 54 %
Platelets: 218 10*3/uL (ref 150–400)
RBC: 3.72 MIL/uL — ABNORMAL LOW (ref 3.87–5.11)
RDW: 12.2 % (ref 11.5–15.5)
WBC: 4.3 10*3/uL (ref 4.0–10.5)
nRBC: 0 % (ref 0.0–0.2)

## 2019-12-06 LAB — COMPREHENSIVE METABOLIC PANEL
ALT: 12 U/L (ref 0–44)
AST: 14 U/L — ABNORMAL LOW (ref 15–41)
Albumin: 3.6 g/dL (ref 3.5–5.0)
Alkaline Phosphatase: 81 U/L (ref 38–126)
Anion gap: 7 (ref 5–15)
BUN: 15 mg/dL (ref 6–20)
CO2: 28 mmol/L (ref 22–32)
Calcium: 9.4 mg/dL (ref 8.9–10.3)
Chloride: 107 mmol/L (ref 98–111)
Creatinine, Ser: 0.74 mg/dL (ref 0.44–1.00)
GFR calc Af Amer: >60 mL/min (ref >60)
GFR calc non Af Amer: >60 mL/min (ref >60)
Glucose, Bld: 94 mg/dL (ref 70–99)
Potassium: 3.9 mmol/L (ref 3.5–5.1)
Sodium: 142 mmol/L (ref 135–145)
Total Bilirubin: <0.2 mg/dL — ABNORMAL LOW (ref 0.3–1.2)
Total Protein: 6.9 g/dL (ref 6.5–8.1)

## 2019-12-06 NOTE — Progress Notes (Signed)
[facility]  Telephone:([phone]) [phone] [phone]    ID: Paige DONALSON DOB: 26-Jul-1966  MR#: [id number]  AOZ#:[id number]  Patient Care Team: Flossie Buffy, NP as PCP - General (Internal Medicine) Callum Wolf, Virgie Dad, MD as Consulting Physician (Oncology) Rolm Bookbinder, MD as Consulting Physician (General Surgery) Nche, Charlene Brooke, NP as Nurse Practitioner (Internal Medicine) Jalene Mullet, MD as Consulting Physician (Ophthalmology) Larey Dresser, MD as Consulting Physician (Cardiology) Cristine Polio, MD as Consulting Physician (Plastic Surgery) OTHER MD:    CHIEF COMPLAINT: 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   She completed Trastuzumab/Pertuzumab on 09/06/2019.  She tolerated that well.  She has been on anastrozole since December 2020. Her most recent bone density testing performed in 11/2015 at [facility] showed a T-score of -1.6.   REVIEW OF SYSTEMS: [place] is having some borborygmi and just an active stomach although no stomach pain no nausea no vomiting and no weight loss.  There has been no diarrhea.  She has mild constipation.  She has changed her diet and is eating a lot of vegetables which is a good thing.  She does not tolerate cheese but is not otherwise avoiding milk products.  Sometimes she has a little discomfort in the left lower leg.  Aside from these issues a detailed review of systems today was stable  HISTORY OF CURRENT ILLNESS: From the original intake note:  Paige Foster has a prior history of left breast cancer, dating back to 2004. At that time she underwent a left mastectomy for stage 0 (noninvasive) breast cancer, with transverse rectus abdominis (TRAM) flap construction under Dr. Towanda Malkin. She also underwent a right breast reduction. She took  tamoxifen for three years.  More recently she underwent bilateral diagnostic mammography with tomography and left breast ultrasonography at [facility] on 01/03/2018 showing: Breast Density Category B. There is an oval fat containing lesion in the left breast upper outer quadrant posterior depth. No other significant masses, calcifications, or other findings are seen in either breast. Sonographically, there is a 1.5 cm lesion in the left breast upper outer quadrant posterior depth. This lesion is of mixed echogenicity. This correlates as palpated and with mammography findings. Follow up was recommended.  Close follow-up was suggested.  She then presented with a non-tender mass in the left reconstructed breast on 08/30/2018. On physical exam, there is a hard palpable lump measuring 2.0 cm in the upper outer left reconstructed breast 10 cm from the expected location of a nipple. Sonography over this area demonstrates a 1.8 cm x 1.7 cm x 1.4 cm mass in the left breast at 2 o'clock posterior depth 10 cm from the nipple. This mass is of mixed echogenicity. This abnormality is increased in size and correlates as palpated and with prior mammography findings. Color flow imaging demonstrates that there is vascularity present. Elastography imaging assessment is intermediate. No significant abnormalities were seen sonographically in the left axilla.    Accordingly on 08/30/2018 she proceeded to biopsy of the left breast mass in question. The pathology from this procedure showed ([id number]): invasive ductal carcinoma, grade III. Prognostic indicators significant for: estrogen receptor, 100% positive with strong staining intensity and progesterone receptor, 0% negative. Proliferation marker Ki67 at 15%. HER2 positive (3+) by immunohistochemistry.  The patient's subsequent history is as detailed below.   PAST MEDICAL HISTORY: Past Medical History:  Diagnosis Date  . Anemia   . History of blood transfusion 2004  .  History of colon polyps   . Hypertension   . Recurrent breast cancer, left [facility]) oncologist-- dr Jana Hakim    dx 2004, noninvasive Stage 0 ----s/p left mastectomy w/ tram flap construction (and right breast reduction), taken Tamoxifen for 3 yrs;   08-30-2018 recurrent left cancer , Grade III,  cT1c,  ER positive, PR negative, HER-2 positive, invasive ductal carcinoma-- neoadjuvant chemo to start 09-26-2018  . Renal artery stenosis (HCC)    mild right external renal artery stenosis per duplex in epic 08-09-2013  . Wears glasses     PAST SURGICAL HISTORY: Past Surgical History:  Procedure Laterality Date  . BREAST LUMPECTOMY WITH RADIOACTIVE SEED LOCALIZATION Left 02/20/2019   Procedure: LEFT BREAST LUMPECTOMY WITH RADIOACTIVE SEED LOCALIZATION;  Surgeon: Rolm Bookbinder, MD;  Location: [facility];  Service: General;  Laterality: Left;  . BREAST SURGERY Left    Transflap  . COLONOSCOPY    . MASTECTOMY Left 2004   w/  TRAM flap construction and right breast augmentation with abdominoplasy  . PARS PLANA VITRECTOMY Left 12/18/2018   Procedure: PARS PLANA VITRECTOMY WITH 25 GAUGE, ENDOLASER;  Surgeon: Jalene Mullet, MD;  Location: [facility];  Service: Ophthalmology;  Laterality: Left;  . PORTACATH PLACEMENT N/A 09/25/2018   Procedure: INSERTION PORT-A-CATH WITH ULTRASOUND;  Surgeon: Rolm Bookbinder, MD;  Location: [facility];  Service: General;  Laterality: N/A;  . TUBAL LIGATION Bilateral yrs ago    FAMILY HISTORY: Family History  Problem Relation Age of Onset  . Diabetes Mother   . Hypertension Mother   . Kidney disease Father   . Colon cancer Neg Hx   . Colon polyps Neg Hx   . Gallbladder disease Neg Hx   . Heart disease Neg Hx   . Esophageal cancer Neg Hx    [name]'s father died from unknown causes in his early 49's. Patients' mother died from diabetes complications at age 52. The patient has 1 sister. Patient denies anyone in her family having breast, ovarian, prostate, or pancreatic  cancer.    GYNECOLOGIC HISTORY:  Patient's last menstrual period was 06/08/2007. 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 Her husband, Elwin Mocha, works at [employer]. Darnetta has two children, Vonna Kotyk and Shanon Brow. Vonna Kotyk lives with her, is 85, and it attending [employer] for a computer based degree. Shanon Brow lives with her, is 60, and recently graduated from [employer] with a degree in Careers information officer.  He works for [employer] has no grandchildren. She attends the [employer]: In the absence of any documents to the contrary her husband, Elwin Mocha, is automatically her healthcare power of attorney     HEALTH MAINTENANCE: Social History   Tobacco Use  . Smoking status: Never Smoker  . Smokeless tobacco: Never Used  Substance Use Topics  . Alcohol use: Not Currently    Alcohol/week: 0.0 standard drinks    Comment: Occassionally  . Drug use: No    Colonoscopy: yes  PAP:   Bone density: yes, 2017; -1.6, osteopenic   No Known Allergies  Current Outpatient Medications  Medication Sig Dispense Refill  . amLODipine (NORVASC) 10 MG tablet Take 1 tablet (10 mg total) by mouth daily. Need office visit for additional refills  90 tablet 3  . anastrozole (ARIMIDEX) 1 MG tablet Take 1 tablet (1 mg total) by mouth daily. 90 tablet 4  . Ascorbic Acid (VITAMIN C) 1000 MG tablet Take 1,000 mg by mouth daily.    . Flaxseed, Linseed, (FLAXSEED OIL) 1000 MG CAPS Take 1,000 mg by mouth daily.    Marland Kitchen LORazepam (ATIVAN) 0.5 MG tablet Take 1 tablet (0.5 mg total) by mouth every 8 (eight) hours as needed for anxiety or sleep. 30 tablet 0  . losartan (COZAAR) 50 MG tablet Take 1 tablet (50 mg total) by mouth daily. Need office visit for additional refills 90 tablet 3  . Polyethyl Glycol-Propyl Glycol (SYSTANE)  0.4-0.3 % SOLN Place 1 drop into both eyes 4 (four) times daily as needed (for dry eyes).    . prednisoLONE acetate (PRED FORTE) 1 % ophthalmic suspension Place 1 drop into the left eye 4 (four) times daily.     No current facility-administered medications for this visit.     OBJECTIVE: African-American woman in no acute distress  Vitals:   12/06/19 1450  BP: 122/73  Pulse: (!) 54  Resp: 18  Temp: 98.3 F (36.8 C)  SpO2: 100%     Body mass index is 23.07 kg/m.   Wt Readings from Last 3 Encounters:  12/06/19 134 lb 6.4 oz (61 kg)  09/06/19 136 lb 12 oz (62 kg)  08/16/19 133 lb 8 oz (60.6 kg)  ECOG FS:1   Sclerae unicteric, EOMs intact Wearing a mask No cervical or supraclavicular adenopathy Lungs no rales or rhonchi Heart regular rate and rhythm Abd soft, nontender, positive bowel sounds MSK no focal spinal tenderness, no upper extremity lymphedema Neuro: nonfocal, well oriented, appropriate affect Breasts: The right breast is benign.  The left she is status post mastectomy with TRAM reconstruction.  There is no evidence of local recurrence.  Both axillae are benign.  LAB RESULTS:  CMP     Component Value Date/Time   NA 142 12/06/2019 1441   NA 140 10/12/2017 0950   K 3.9 12/06/2019 1441   CL 107 12/06/2019 1441   CO2 28 12/06/2019 1441   GLUCOSE 94 12/06/2019 1441   BUN 15 12/06/2019 1441   BUN 10 10/12/2017 0950   CREATININE 0.74 12/06/2019 1441   CREATININE 0.78 09/07/2018 1455   CALCIUM 9.4 12/06/2019 1441   PROT 6.9 12/06/2019 1441   PROT 7.5 10/12/2017 0950   ALBUMIN 3.6 12/06/2019 1441   ALBUMIN 4.3 10/12/2017 0950   AST 14 (L) 12/06/2019 1441   AST 14 (L) 09/07/2018 1455   ALT 12 12/06/2019 1441   ALT 13 09/07/2018 1455   ALKPHOS 81 12/06/2019 1441   BILITOT <0.2 (L) 12/06/2019 1441   BILITOT 0.2 (L) 09/07/2018 1455   GFRNONAA >60 12/06/2019 1441   GFRNONAA >60 09/07/2018 1455   GFRAA >60 12/06/2019 1441   GFRAA >60 09/07/2018 1455    No  results found for: TOTALPROTELP, ALBUMINELP, A1GS, A2GS, BETS, BETA2SER, GAMS, MSPIKE, SPEI  No results found for: KPAFRELGTCHN, LAMBDASER, KAPLAMBRATIO  Lab Results  Component Value Date   WBC 4.3 12/06/2019   NEUTROABS 2.4 12/06/2019   HGB 11.9 (L) 12/06/2019   HCT 36.3 12/06/2019   MCV 97.6 12/06/2019   PLT 218 12/06/2019    Lab Results  Component Value Date   LABCA2 <4 08/30/2007    No components found for: [id number]  No results for input(s): INR in the last 168 hours.  Lab Results  Component Value Date  LABCA2 <4 08/30/2007    No results found for: KJZ791  No results found for: TAV697  No results found for: XYI016  No results found for: CA2729  No components found for: HGQUANT  No results found for: CEA1 / No results found for: CEA1   No results found for: AFPTUMOR  No results found for: CHROMOGRNA  No results found for: PSA1  Appointment on 12/06/2019  Component Date Value Ref Range Status  . Sodium 12/06/2019 142  135 - 145 mmol/L Final  . Potassium 12/06/2019 3.9  3.5 - 5.1 mmol/L Final  . Chloride 12/06/2019 107  98 - 111 mmol/L Final  . CO2 12/06/2019 28  22 - 32 mmol/L Final  . Glucose, Bld 12/06/2019 94  70 - 99 mg/dL Final   Glucose reference range applies only to samples taken after fasting for at least 8 hours.  . BUN 12/06/2019 15  6 - 20 mg/dL Final  . Creatinine, Ser 12/06/2019 0.74  0.44 - 1.00 mg/dL Final  . Calcium 12/06/2019 9.4  8.9 - 10.3 mg/dL Final  . Total Protein 12/06/2019 6.9  6.5 - 8.1 g/dL Final  . Albumin 12/06/2019 3.6  3.5 - 5.0 g/dL Final  . AST 12/06/2019 14* 15 - 41 U/L Final  . ALT 12/06/2019 12  0 - 44 U/L Final  . Alkaline Phosphatase 12/06/2019 81  38 - 126 U/L Final  . Total Bilirubin 12/06/2019 <0.2* 0.3 - 1.2 mg/dL Final  . GFR calc non Af Amer 12/06/2019 >60  >60 mL/min Final  . GFR calc Af Amer 12/06/2019 >60  >60 mL/min Final  . Anion gap 12/06/2019 7  5 - 15 Final   Performed at [facility], [place] [address] [place], [place] [address]  . WBC 12/06/2019 4.3  4.0 - 10.5 K/uL Final  . RBC 12/06/2019 3.72* 3.87 - 5.11 MIL/uL Final  . Hemoglobin 12/06/2019 11.9* 12.0 - 15.0 g/dL Final  . HCT 12/06/2019 36.3  36.0 - 46.0 % Final  . MCV 12/06/2019 97.6  80.0 - 100.0 fL Final  . MCH 12/06/2019 32.0  26.0 - 34.0 pg Final  . MCHC 12/06/2019 32.8  30.0 - 36.0 g/dL Final  . RDW 12/06/2019 12.2  11.5 - 15.5 % Final  . Platelets 12/06/2019 218  150 - 400 K/uL Final  . nRBC 12/06/2019 0.0  0.0 - 0.2 % Final  . Neutrophils Relative % 12/06/2019 54  % Final  . Neutro Abs 12/06/2019 2.4  1.7 - 7.7 K/uL Final  . Lymphocytes Relative 12/06/2019 33  % Final  . Lymphs Abs 12/06/2019 1.4  0.7 - 4.0 K/uL Final  . Monocytes Relative 12/06/2019 11  % Final  . Monocytes Absolute 12/06/2019 0.5  0.1 - 1.0 K/uL Final  . Eosinophils Relative 12/06/2019 1  % Final  . Eosinophils Absolute 12/06/2019 0.0  0.0 - 0.5 K/uL Final  . Basophils Relative 12/06/2019 1  % Final  . Basophils Absolute 12/06/2019 0.0  0.0 - 0.1 K/uL Final  . Immature Granulocytes 12/06/2019 0  % Final  . Abs Immature Granulocytes 12/06/2019 0.00  0.00 - 0.07 K/uL Final   Performed at [facility], [place] [address] [place],  [address]    (this displays the last labs from the last 3 days)  No results found for: TOTALPROTELP, ALBUMINELP, A1GS, A2GS, BETS, BETA2SER, GAMS, MSPIKE, SPEI (this displays SPEP labs)  No results found for: KPAFRELGTCHN, LAMBDASER, KAPLAMBRATIO (kappa/lambda light chains)  No  results found for: HGBA, HGBA2QUANT, HGBFQUANT, HGBSQUAN (Hemoglobinopathy evaluation)   Lab Results  Component Value Date   LDH 169 08/30/2007    Lab Results  Component Value Date   IRON 78 01/01/2009   TIBC 424 08/30/2007   IRONPCTSAT 17.9 (L) 01/01/2009   (Iron and TIBC)  Lab Results  Component Value Date   FERRITIN 19 08/30/2007    Urinalysis    Component Value  Date/Time   LABSPEC 1.015 04/09/2008 1548   PHURINE 7.0 04/09/2008 1548   HGBUR large 04/09/2008 1548   BILIRUBINUR negative 04/09/2008 1548   UROBILINOGEN 0.2 04/09/2008 1548   NITRITE negative 04/09/2008 1548     STUDIES:  No results found.  ELIGIBLE FOR AVAILABLE RESEARCH PROTOCOL: No   ASSESSMENT: 54 y.o. [place], [place] woman  (1) history of left-sided ductal carcinoma in situ 2004  (a) s/p left mastectomy with TRAM reconstruction  (b) status post tamoxifen x3 years  (2) left breast upper outer quadrant biopsy 08/30/2018 shows a clinical T1c N0 invasive ductal carcinoma, grade 3, estrogen receptor strongly positive, progesterone receptor negative, with HER-2 amplification, and and MIB-1 of 15%.   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    (b) chemotherapy discontinued after 4 cycles because of intercurrent eye surgery  (c) continuing trastuzumab and Pertuzumab to complete a year (through February 2021)  (d) echocardiogram on 01/23/2019 that shows well preserved EF of 60-65%  (e) echocardiogram on 04/23/2019 shows EF of 60-65%  (f) echocardiogram 08/02/2019 shows an ejection fraction in the 60-65% range  (4) left lumpectomy 02/20/2019 showed a residual  ypT1c NX invasive ductal carcinoma, grade 2 with negative margins.    (5) adjuvant radiation: Radiation Treatment Dates: 04/12/2019 through 05/28/2019 Site Technique Total Dose (Gy) Dose per Fx (Gy) Completed Fx Beam Energies  Breast: CW_Lt 3D 50.4/50.4 1.8 28/28 6X, 10X  Breast: CW_Lt_SCV_PAB 3D 50.4/50.4 1.8 28/28 6X, 10X  Breast: CW_Lt_Bst Electron 10/10 2 5/5 6X, 10X   (6) anastrozole started  06/26/2019  (a) D[facility] scan at [facility] 12/24/2015 found a T score of -1.6  (7) genetics testing 02/03/2019 through the Common Hereditary Cancers Panel offered by [facility] found no deleterious mutations in APC, ATM, AXIN2, BARD1, BMPR1A, BRCA1, BRCA2, BRIP1, CDH1, CDKN2A (p14ARF), CDKN2A (p16INK4a), CKD4, CHEK2, CTNNA1, DICER1, EPCAM (Deletion/duplication testing only), GREM1 (promoter region deletion/duplication testing only), KIT, MEN1, MLH1, MSH2, MSH3, MSH6, MUTYH, NBN, NF1, NHTL1, PALB2, PDGFRA, PMS2, POLD1, POLE, PTEN, RAD50, RAD51C, RAD51D, RNF43, SDHB, SDHC, SDHD, SMAD4, SMARCA4. STK11, TP53, TSC1, TSC2, and VHL.  The following genes were evaluated for sequence changes only: SDHA and HOXB13 c.251G>A variant only.   PLAN: Rebeccah is tolerating anastrozole generally well.  I think the digestive issues that she is experiencing are not related to the drug.  She is on a much better diet than she used to be from her account, but it is mostly vegetables and it may be that her gut flora has not quite gotten used to the new regimen.  I recommend that she avoid all dairy including not just cheese but also yogurt and milk.  She can of course have coconut milk or almond milk if she wishes.  If she gets constipated she will take a stool softener.  Otherwise she will return to see me in October after her mammography in July  She knows to call for any other issue  Total encounter time 30 minutes.Chauncey Cruel, MD Medical Oncology and Hematology [facility] [place], [place] [address] Tel. [phone]    Fax. [phone]   I, Wilburn Mylar, am acting as scribe for Dr. Virgie Dad. Jayd Cadieux.  I, Lurline Del MD, have reviewed the above documentation for accuracy and completeness, and I agree with the above.   *Total Encounter Time as defined by the Centers for Medicare and Medicaid Services includes, in addition to the face-to-face time of a patient visit  (documented in the note above) non-face-to-face time: obtaining and reviewing outside history, ordering and reviewing medications, tests or procedures, care coordination (communications with other health care professionals or caregivers) and documentation in the medical record.

## 2019-12-07 ENCOUNTER — Telehealth: Payer: Self-pay

## 2019-12-07 NOTE — Telephone Encounter (Signed)
Refill Request for   Losartan 50mg  was send by [facility]. Pt was left a message to call office and schedule an OV before any refills can be given.

## 2019-12-10 ENCOUNTER — Other Ambulatory Visit: Payer: Self-pay | Admitting: Oncology

## 2019-12-10 DIAGNOSIS — C50412 Malignant neoplasm of upper-outer quadrant of left female breast: Secondary | ICD-10-CM

## 2019-12-10 DIAGNOSIS — Z170 Estrogen receptor positive status [ER+]: Secondary | ICD-10-CM

## 2019-12-10 DIAGNOSIS — C50912 Malignant neoplasm of unspecified site of left female breast: Secondary | ICD-10-CM

## 2020-01-02 ENCOUNTER — Encounter: Payer: BC Managed Care – PPO | Admitting: Nurse Practitioner

## 2020-01-02 ENCOUNTER — Encounter: Payer: Self-pay | Admitting: Nurse Practitioner

## 2020-01-02 ENCOUNTER — Other Ambulatory Visit: Payer: Self-pay

## 2020-01-02 ENCOUNTER — Other Ambulatory Visit: Payer: Self-pay | Admitting: Nurse Practitioner

## 2020-01-02 VITALS — BP 128/64 | HR 64 | Temp 96.9°F | Ht 64.0 in | Wt 134.8 lb

## 2020-01-02 DIAGNOSIS — I10 Essential (primary) hypertension: Secondary | ICD-10-CM

## 2020-01-02 MED ORDER — AMLODIPINE BESYLATE 10 MG PO TABS: 10.0000 mg | ORAL_TABLET | Freq: Every day | ORAL | 3 refills | Status: DC

## 2020-01-02 MED ORDER — LOSARTAN POTASSIUM 50 MG PO TABS: 50.0000 mg | ORAL_TABLET | Freq: Every day | ORAL | 3 refills | Status: DC

## 2020-01-03 NOTE — Progress Notes (Signed)
This encounter was created in error - please disregard.

## 2020-03-31 IMAGING — MR MR BILATERAL BREAST WITHOUT AND WITH CONTRAST
8 of 12 series · 33 of 48 positions shown · IV contrast (gadavist)
Comparison: Previous exam(s).  MRI breast 09/17/2018

CLINICAL DATA: Patient with history of recurrent left breast cancer
diagnosed [DATE]. Patient has history of left mastectomy and tram
flap reconstruction. Evaluate response to neoadjuvant chemotherapy.

EXAM:
BILATERAL BREAST MRI WITH AND WITHOUT CONTRAST
TECHNIQUE: Multiplanar, multisequence MR images of both breasts were obtained
prior to and following the intravenous administration of 7 ml of
Gadavist

[Series 2: t2_tirm_tra ipat (a-p) · axial · 3.0mm · 0.70mm/px · 1 of 62 slices shown]
[im 1/62]
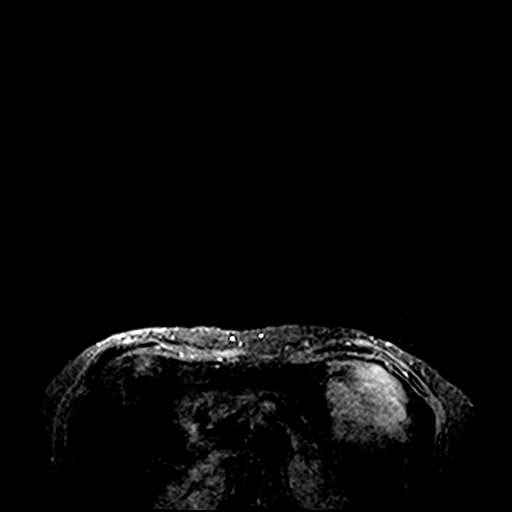

[Series 3: fl3d pre-cm no · axial · non-contrast · 1.2mm · 0.94mm/px · z∈[-120,+90]mm · 5 of 176 slices shown]
[im 1/176]
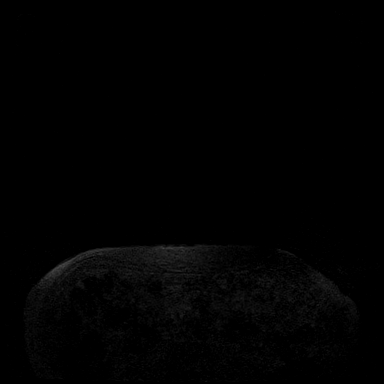
[im 44/176]
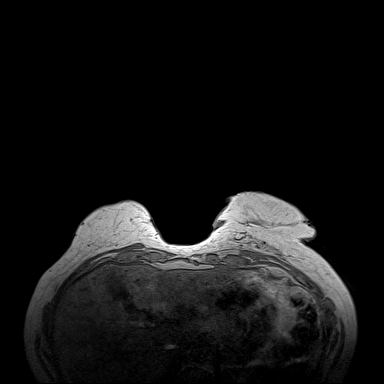
[im 88/176]
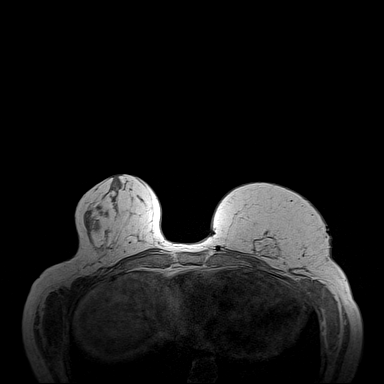
[im 132/176]
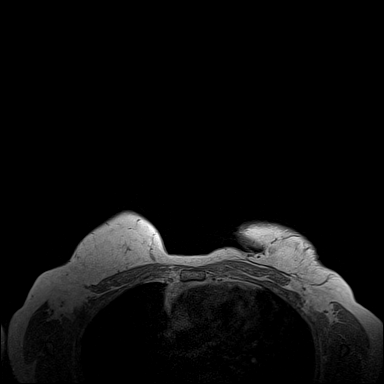
[im 176/176]
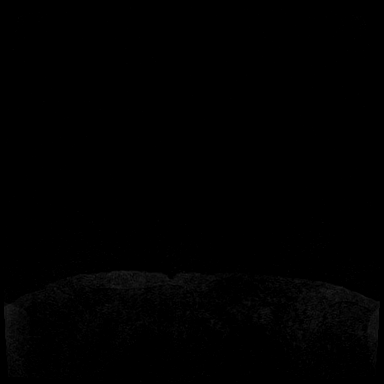

[Series 4: fl3d pre-cm · axial · non-contrast · 1.2mm · 0.94mm/px · z∈[-120,+90]mm · 5 of 176 slices shown]
[im 1/176]
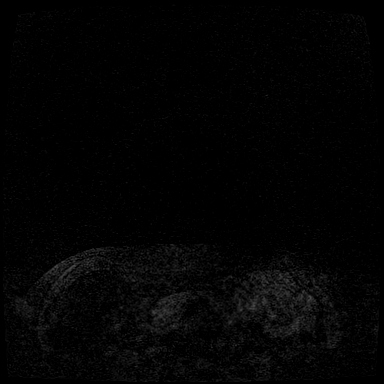
[im 44/176]
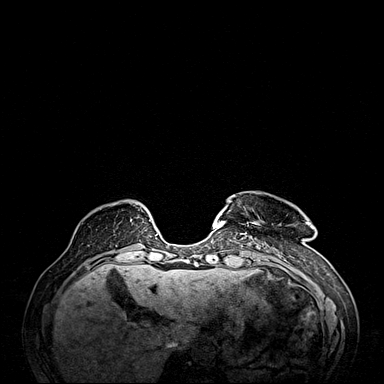
[im 88/176]
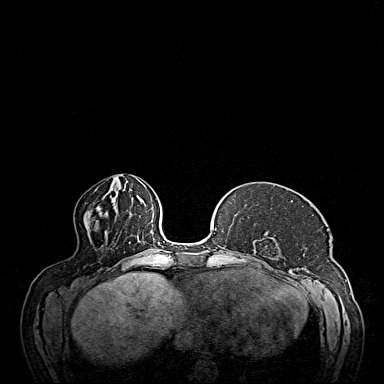
[im 132/176]
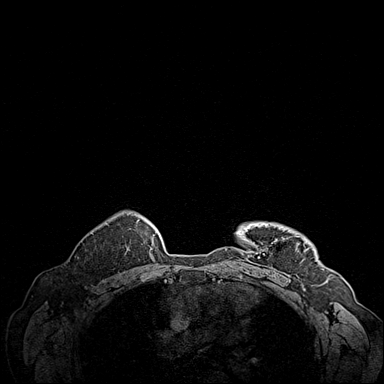
[im 176/176]
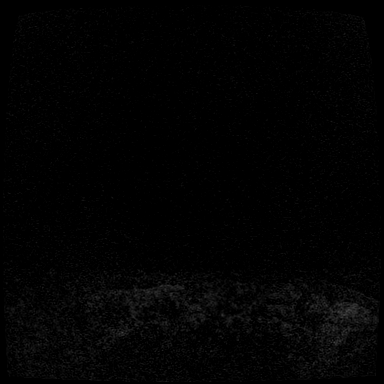

[Series 5: fl3d post immediate · axial · 1.2mm · 0.94mm/px · z∈[-120,+90]mm · 5 of 176 slices shown (1 of 3)]
[im 1/176]
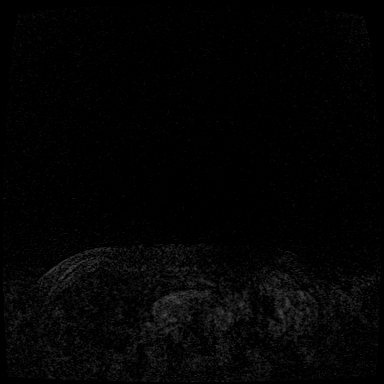
[im 44/176]
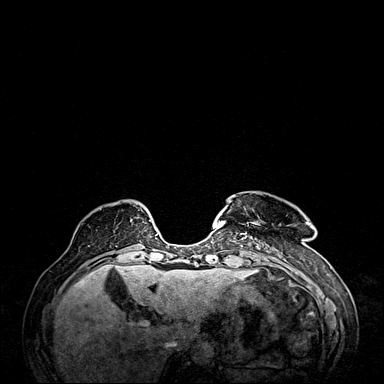
[im 88/176]
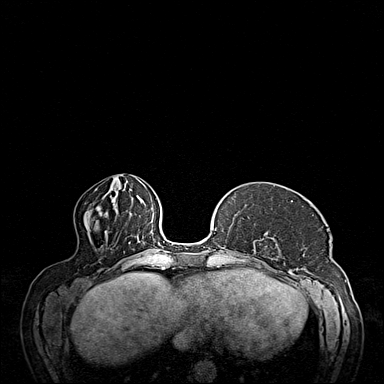
[im 132/176]
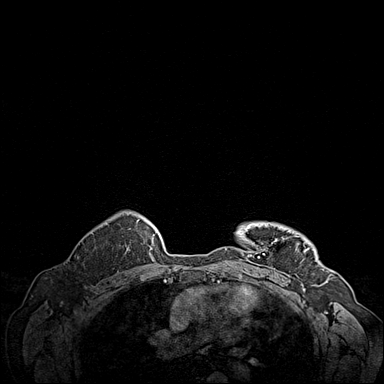
[im 176/176]
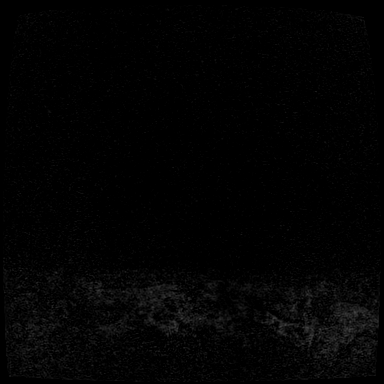

[Series 6: fl3d post immediate · axial · 1.2mm · 0.94mm/px · z∈[-120,+90]mm · 5 of 175 slices shown (2 of 3)]
[im 1/175]
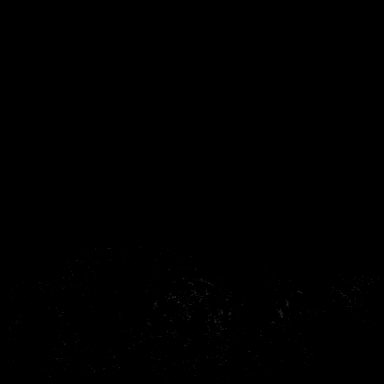
[im 44/175]
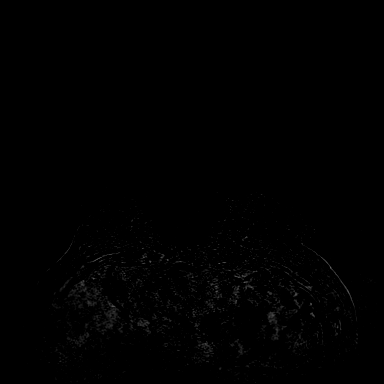
[im 88/175]
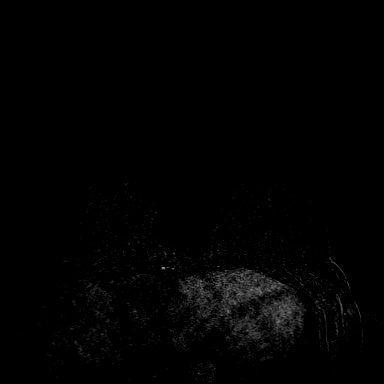
[im 131/175]
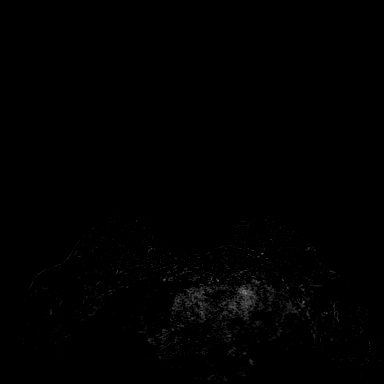
[im 175/175]
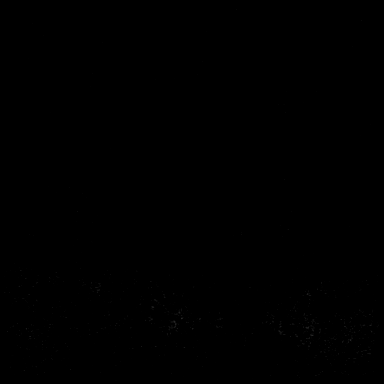

[Series 7: fl3d post immediate · axial · 211.2mm · 0.94mm/px · 1 of 1 slices shown (3 of 3)]
[im 1/1]
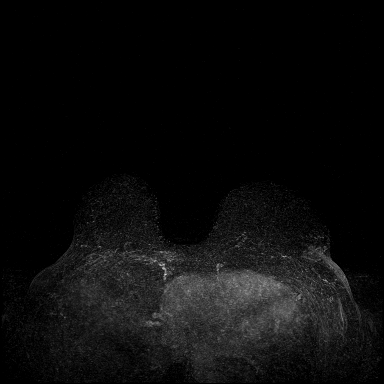

[Series 8: fl3d post 3min · axial · 1.2mm · 0.94mm/px · z∈[-120,+90]mm · 6 of 176 slices shown]
[im 1/176]
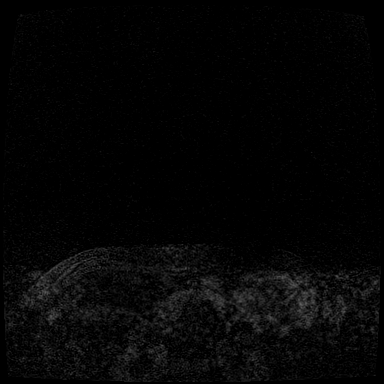
[im 36/176]
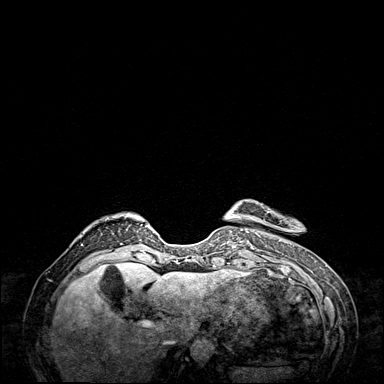
[im 71/176]
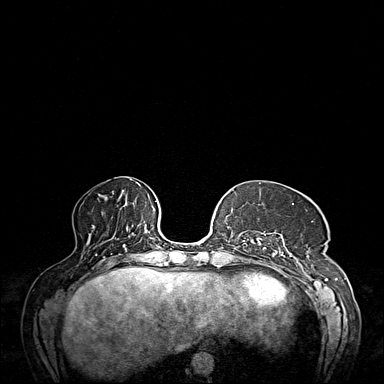
[im 106/176]
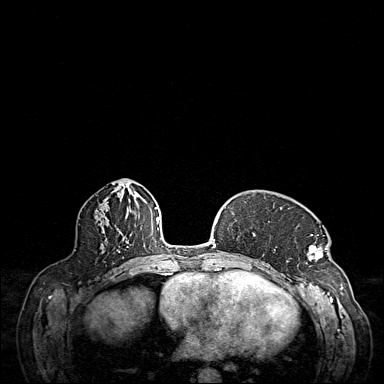
[im 141/176]
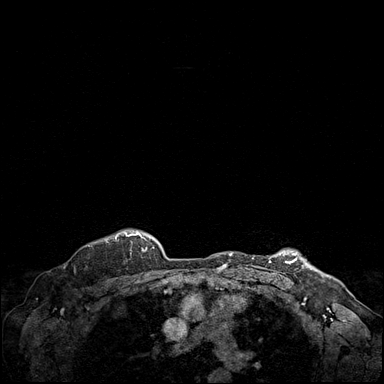
[im 176/176]
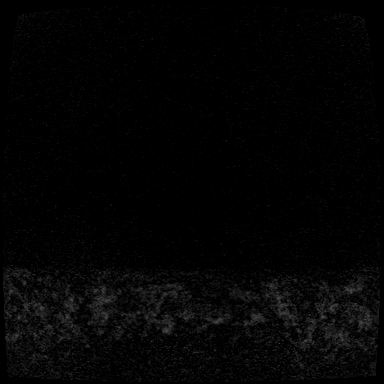

[Series 9: fl3d post 3min_sub · axial · 1.2mm · 0.94mm/px · z∈[-120,+48]mm · 5 of 176 slices shown]
[im 1/176]
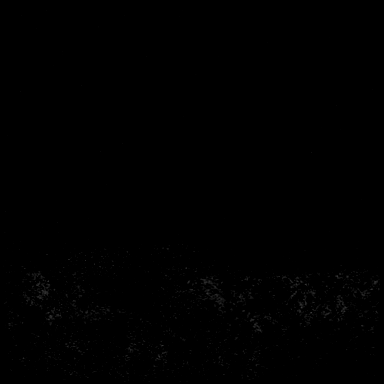
[im 36/176]
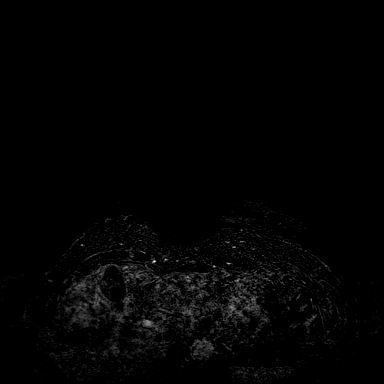
[im 71/176]
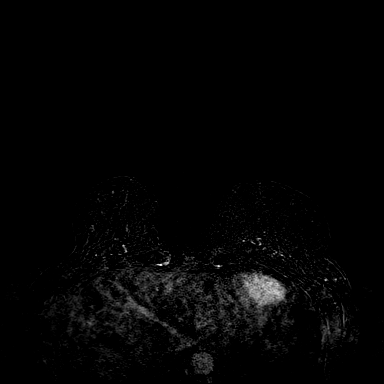
[im 106/176]
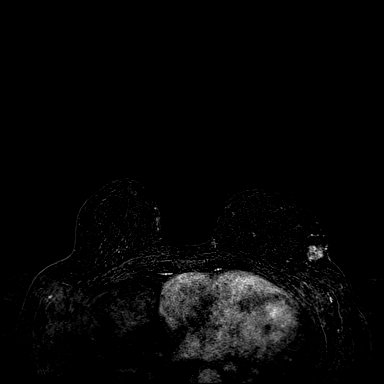
[im 141/176]
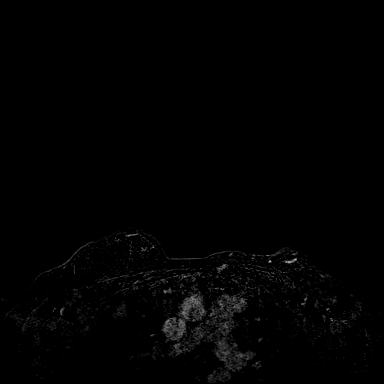

[33 of 48 positions shown; findings below may reference images not displayed]

Three-dimensional MR images were rendered by post-processing of the
original MR data on an independent workstation. The
three-dimensional MR images were interpreted, and findings are
reported in the following complete MRI report for this study. Three
dimensional images were evaluated at the independent [employer]
workstation
FINDINGS: Breast composition: b. Scattered fibroglandular tissue.

Background parenchymal enhancement: Minimal

Right breast: No mass or abnormal enhancement.

Left breast: Patient status post left mastectomy and tram flap
reconstruction. Mass within the upper-outer reconstructed left
breast is similar when compared to prior exam measuring 2.0 x 2.0 x
1.9 cm. No additional suspicious areas of enhancement identified
within the left breast.

Lymph nodes: No abnormal appearing lymph nodes.

Ancillary findings:  None.
IMPRESSION: Grossly similar-appearing mass within the upper-outer reconstructed
left breast measuring approximately 2.0 cm. No evidence for
additional disease within the reconstructed left breast or right
breast.

RECOMMENDATION:
Treatment plan for known left breast malignancy.

BI-RADS CATEGORY  6: Known biopsy-proven malignancy.

## 2020-05-18 NOTE — Progress Notes (Signed)
[place]  [phone] [phone]    ID: Paige Paige Foster DOB: 1965-10-07  MR#: [id number]  OMB#:[id number]  Patient Care Team: Paige Buffy, NP as PCP - General (Internal Medicine) Paige Paige Foster, Paige Dad, Foster as Consulting Physician (Oncology) Paige Bookbinder, Foster as Consulting Physician (General Surgery) Nche, Charlene Brooke, NP as Nurse Practitioner (Internal Medicine) Paige Mullet, Foster as Consulting Physician (Ophthalmology) Paige Dresser, Foster as Consulting Physician (Cardiology) Paige Polio, Foster as Consulting Physician (Plastic Surgery) Paige Paige Foster:    CHIEF COMPLAINT: 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   She has been on anastrozole since December 2020. Her most recent bone density testing performed in 11/2015 at [facility] showed a T-score of -1.6.  Since her last visit, she underwent bilateral diagnostic mammography with tomography at [place]: [place]: From the original intake note:  Paige Paige Foster has a prior history of left breast cancer, dating back to 2004. At that time she underwent a left mastectomy for stage 0 (noninvasive) breast cancer, with transverse rectus abdominis (TRAM) flap construction under Paige. [place] Paige Foster. She also underwent a right breast reduction. She took tamoxifen for three years.  More recently she underwent bilateral diagnostic mammography with tomography and left breast ultrasonography at [facility] on 01/03/2018 showing: Breast Density Category B. There is an oval fat containing lesion in the left breast upper outer quadrant posterior depth. No Paige significant masses, calcifications, or Paige findings are seen in either breast. Sonographically, there is a 1.5 cm  lesion in the left breast upper outer quadrant posterior depth. This lesion is of mixed echogenicity. This correlates as palpated and with mammography findings. Follow up was recommended.  Close follow-up was suggested.  She then presented with a non-tender mass in the left reconstructed breast on 08/30/2018. On physical exam, there is a hard palpable lump measuring 2.0 cm in the upper outer left reconstructed breast 10 cm from the expected location of a nipple. Sonography over this area demonstrates a 1.8 cm x 1.7 cm x 1.4 cm mass in the left breast at 2 o'clock posterior depth 10 cm from the nipple. This mass is of mixed echogenicity. This abnormality is increased in size and correlates as palpated and with prior mammography findings. Color flow imaging demonstrates that there is vascularity present. Elastography imaging assessment is intermediate. No significant abnormalities were seen sonographically in the left axilla.    Accordingly on 08/30/2018 she proceeded to biopsy of the left breast mass in question. The pathology from this procedure showed ([id number]): invasive ductal carcinoma, grade III. Prognostic indicators significant for: estrogen receptor, 100% positive with strong staining intensity and progesterone receptor, 0% negative. Proliferation marker Ki67 at 15%. HER2 positive (3+) by immunohistochemistry.  The patient's subsequent history is as detailed below.   PAST MEDICAL HISTORY: Past Medical History:  Diagnosis Date   Anemia    History of blood transfusion 2004   History of colon polyps    Hypertension    Recurrent breast cancer, left [facility]) oncologist-- Paige Paige Foster    dx 2004, noninvasive Stage 0 ----s/p left mastectomy w/ tram flap construction (and right breast reduction), taken Tamoxifen for 3 yrs;   08-30-2018 recurrent left cancer , Grade III,  cT1c,  ER positive, PR negative, HER-2 positive, invasive  ductal carcinoma-- neoadjuvant chemo to start 09-26-2018   Renal  artery stenosis (HCC)    mild right external renal artery stenosis per duplex in epic 08-09-2013   Wears glasses     PAST SURGICAL HISTORY: Past Surgical History:  Procedure Laterality Date   BREAST LUMPECTOMY WITH RADIOACTIVE SEED LOCALIZATION Left 02/20/2019   Procedure: LEFT BREAST LUMPECTOMY WITH RADIOACTIVE SEED LOCALIZATION;  Surgeon: Paige Bookbinder, Foster;  Location: [facility];  Service: General;  Laterality: Left;   BREAST SURGERY Left    Transflap   COLONOSCOPY     MASTECTOMY Left 2004   w/  TRAM flap construction and right breast augmentation with abdominoplasy   PARS PLANA VITRECTOMY Left 12/18/2018   Procedure: PARS PLANA VITRECTOMY WITH 25 GAUGE, ENDOLASER;  Surgeon: Paige Mullet, Foster;  Location: [facility];  Service: Ophthalmology;  Laterality: Left;   PORTACATH PLACEMENT N/A 09/25/2018   Procedure: INSERTION PORT-A-CATH WITH ULTRASOUND;  Surgeon: Paige Bookbinder, Foster;  Location: [facility];  Service: General;  Laterality: N/A;   TUBAL LIGATION Bilateral yrs ago    FAMILY HISTORY: Family History  Problem Relation Age of Onset   Diabetes Mother    Hypertension Mother    Kidney disease Paige Foster    Colon cancer Neg Hx    Colon polyps Neg Hx    Gallbladder disease Neg Hx    Heart disease Neg Hx    Esophageal cancer Neg Hx    Paige Paige Foster died from unknown causes in his early 70's. Patients' mother died from diabetes complications at age 57. The patient has 1 sister. Patient denies anyone in her family having breast, ovarian, prostate, or pancreatic cancer.    GYNECOLOGIC HISTORY:  Patient's last menstrual period was 06/08/2007. 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 Her husband, Paige Paige Foster, works at [employer]. Paige Paige Foster has two children, Paige Paige Foster and Paige Paige Foster. Paige Paige Foster lives with  her, is 26, and it attending [employer] for a computer based degree. Paige Paige Foster lives with her, is 54, and recently graduated from [employer] with a degree in Careers information officer.  He works for [employer] has no grandchildren. She attends the [employer].   ADVANCED DIRECTIVES: In the absence of any documents to the contrary her husband, Paige Paige Foster, is automatically her healthcare power of attorney     HEALTH MAINTENANCE: Social History   Tobacco Use   Smoking status: Never Smoker   Smokeless tobacco: Never Used  Vaping Use   Vaping Use: Never used  Substance Use Topics   Alcohol use: Not Currently    Alcohol/week: 0.0 standard drinks    Comment: Occassionally   Drug use: No    Colonoscopy: yes  PAP:   Bone density: yes, 2017; -1.6, osteopenic   No Known Allergies  Current Outpatient Medications  Medication Sig Dispense Refill   amLODipine (NORVASC) 10 MG tablet Take 1 tablet (10 mg total) by mouth daily. 90 tablet 3   anastrozole (ARIMIDEX) 1 MG tablet Take 1 tablet (1 mg total) by mouth daily. 90 tablet 4   Ascorbic Acid (VITAMIN C) 1000 MG tablet Take 1,000 mg by mouth daily.     Flaxseed, Linseed, (FLAXSEED OIL) 1000 MG CAPS Take 1,000 mg by mouth daily.     Influenza Vac Typ A&B Surf Ant 0.5 ML SUSY Fluvirin 2017-2018 (PF) 45 mcg(15 mcg x3)/0.5  mL intramuscular syringe     LORazepam (ATIVAN) 0.5 MG tablet Take 1 tablet (0.5 mg total) by mouth every 8 (eight) hours as needed for anxiety or sleep. 30 tablet 0   losartan (COZAAR) 50 MG tablet Take 1 tablet (50 mg total) by mouth daily. 90 tablet 3   Polyethyl Glycol-Propyl Glycol (SYSTANE) 0.4-0.3 % SOLN Place 1 drop into both eyes 4 (four) times daily as needed (for dry eyes).     prednisoLONE acetate (PRED FORTE) 1 % ophthalmic suspension Place 1 drop into the left eye 4 (four) times daily.     No current facility-administered medications for this visit.     OBJECTIVE:   There were no vitals filed for this visit.   There is  no height or weight on file to calculate BMI.   Wt Readings from Last 3 Encounters:  01/02/20 134 lb 12.8 oz (61.1 kg)  12/06/19 134 lb 6.4 oz (61 kg)  09/06/19 136 lb 12 oz (62 kg)      LAB RESULTS:  CMP     Component Value Date/Time   NA 142 12/06/2019 1441   NA 140 10/12/2017 0950   K 3.9 12/06/2019 1441   CL 107 12/06/2019 1441   CO2 28 12/06/2019 1441   GLUCOSE 94 12/06/2019 1441   BUN 15 12/06/2019 1441   BUN 10 10/12/2017 0950   CREATININE 0.74 12/06/2019 1441   CREATININE 0.78 09/07/2018 1455   CALCIUM 9.4 12/06/2019 1441   PROT 6.9 12/06/2019 1441   PROT 7.5 10/12/2017 0950   ALBUMIN 3.6 12/06/2019 1441   ALBUMIN 4.3 10/12/2017 0950   AST 14 (L) 12/06/2019 1441   AST 14 (L) 09/07/2018 1455   ALT 12 12/06/2019 1441   ALT 13 09/07/2018 1455   ALKPHOS 81 12/06/2019 1441   BILITOT <0.2 (L) 12/06/2019 1441   BILITOT 0.2 (L) 09/07/2018 1455   GFRNONAA >60 12/06/2019 1441   GFRNONAA >60 09/07/2018 1455   GFRAA >60 12/06/2019 1441   GFRAA >60 09/07/2018 1455   Lab Results  Component Value Date   WBC 4.3 12/06/2019   NEUTROABS 2.4 12/06/2019   HGB 11.9 (L) 12/06/2019   HCT 36.3 12/06/2019   MCV 97.6 12/06/2019   PLT 218 12/06/2019    Lab Results  Component Value Date   LABCA2 <4 08/30/2007    No components found for: [id number]  No results for input(s): INR in the last 168 hours.  Lab Results  Component Value Date   LABCA2 <4 08/30/2007    No results found for: QVZ563  No results found for: OVF643  No results found for: PIR518  No results found for: CA2729  No components found for: HGQUANT  No results found for: CEA1 / No results found for: CEA1  No results found for: AFPTUMOR  No results found for: CHROMOGRNA  No results found for: TOTALPROTELP, ALBUMINELP, A1GS, A2GS, BETS, BETA2SER, GAMS, MSPIKE, SPEI (this displays SPEP labs)  No results found for: KPAFRELGTCHN, LAMBDASER, KAPLAMBRATIO (kappa/lambda light chains)  No results  found for: HGBA, HGBA2QUANT, HGBFQUANT, HGBSQUAN (Hemoglobinopathy evaluation)   Lab Results  Component Value Date   LDH 169 08/30/2007    Lab Results  Component Value Date   IRON 78 01/01/2009   TIBC 424 08/30/2007   IRONPCTSAT 17.9 (L) 01/01/2009   (Iron and TIBC)  Lab Results  Component Value Date   FERRITIN 19 08/30/2007    Urinalysis    Component Value Date/Time   LABSPEC 1.015 04/09/2008 1548  PHURINE 7.0 04/09/2008 1548   HGBUR large 04/09/2008 1548   BILIRUBINUR negative 04/09/2008 1548   UROBILINOGEN 0.2 04/09/2008 1548   NITRITE negative 04/09/2008 1548     STUDIES:  No results found.  ELIGIBLE FOR AVAILABLE RESEARCH PROTOCOL: No   ASSESSMENT: 54 y.o. [place], [place] woman  (1) history of left-sided ductal carcinoma in situ 2004  (a) s/p left mastectomy with TRAM reconstruction  (b) status post tamoxifen x3 years  (2) left breast upper outer quadrant biopsy 08/30/2018 shows a clinical T1c N0 invasive ductal carcinoma, grade 3, estrogen receptor strongly positive, progesterone receptor negative, with HER-2 amplification, and and MIB-1 of 15%.   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    (b) chemotherapy discontinued after 4 cycles because of intercurrent eye surgery  (c) continuing trastuzumab and Pertuzumab to complete a year (through February 2021)  (d) echocardiogram on 01/23/2019 that shows well preserved EF of 60-65%  (e) echocardiogram on 04/23/2019 shows EF of 60-65%  (f) echocardiogram 08/02/2019 shows an ejection fraction in the 60-65% range  (4) left lumpectomy 02/20/2019 showed a residual  ypT1c NX invasive ductal  carcinoma, grade 2 with negative margins.    (5) adjuvant radiation: Radiation Treatment Dates: 04/12/2019 through 05/28/2019 Site Technique Total Dose (Gy) Dose per Fx (Gy) Completed Fx Beam Energies  Breast: CW_Lt 3D 50.4/50.4 1.8 28/28 6X, 10X  Breast: CW_Lt_SCV_PAB 3D 50.4/50.4 1.8 28/28 6X, 10X  Breast: CW_Lt_Bst Electron 10/10 2 5/5 6X, 10X   (6) anastrozole started 06/26/2019  (a) D[facility] scan at [facility] 12/24/2015 found a T score of -1.6  (7) genetics testing 02/03/2019 through the Common Hereditary Cancers Panel offered by [facility] found no deleterious mutations in APC, ATM, AXIN2, BARD1, BMPR1A, BRCA1, BRCA2, BRIP1, CDH1, CDKN2A (p14ARF), CDKN2A (p16INK4a), CKD4, CHEK2, CTNNA1, DICER1, EPCAM (Deletion/duplication testing only), GREM1 (promoter region deletion/duplication testing only), KIT, MEN1, MLH1, MSH2, MSH3, MSH6, MUTYH, NBN, NF1, NHTL1, PALB2, PDGFRA, PMS2, POLD1, POLE, PTEN, RAD50, RAD51C, RAD51D, RNF43, SDHB, SDHC, SDHD, SMAD4, SMARCA4. STK11, TP53, TSC1, TSC2, and VHL.  The following genes were evaluated for sequence changes only: SDHA and HOXB13 c.251G>A variant only.   PLAN: Serrita did not show for her 05/19/2020 visit.  Follow-up letter has been sent  Chauncey Cruel, [facility] [place], [place] [address] Tel. [phone]    Fax. [phone]   I, Wilburn Mylar, am acting as scribe for Paige. Virgie Foster. Ormand Senn.  I, Lurline Del Foster, have reviewed the above documentation for accuracy and completeness, and I agree with the above.   *Total Encounter Time as defined by the Centers for Medicare and Medicaid Services includes, in addition to the face-to-face time of a patient visit (documented in the note above) non-face-to-face time: obtaining and reviewing outside history, ordering and reviewing medications, tests or procedures, care coordination (communications with Paige health care professionals or  caregivers) and documentation in the medical record.

## 2020-05-19 ENCOUNTER — Encounter: Payer: Self-pay | Admitting: Oncology

## 2020-05-19 ENCOUNTER — Inpatient Hospital Stay: Payer: BC Managed Care – PPO

## 2020-05-19 ENCOUNTER — Inpatient Hospital Stay (HOSPITAL_BASED_OUTPATIENT_CLINIC_OR_DEPARTMENT_OTHER): Payer: BC Managed Care – PPO | Admitting: Oncology

## 2020-05-19 DIAGNOSIS — C50912 Malignant neoplasm of unspecified site of left female breast: Secondary | ICD-10-CM

## 2020-05-20 ENCOUNTER — Telehealth: Payer: Self-pay | Admitting: Oncology

## 2020-05-20 NOTE — Telephone Encounter (Signed)
Rescheduled appointments per 10/21 sch msg. Spoke to patient who is aware of appointment date and time.

## 2020-05-24 ENCOUNTER — Other Ambulatory Visit: Payer: Self-pay

## 2020-05-24 ENCOUNTER — Ambulatory Visit: Payer: Self-pay | Attending: Internal Medicine

## 2020-05-24 DIAGNOSIS — Z23 Encounter for immunization: Secondary | ICD-10-CM

## 2020-05-24 NOTE — Progress Notes (Signed)
   Covid-19 Vaccination Clinic  Name:  Paige Foster    MRN: [id number] DOB: 1966/06/18  05/24/2020  Paige Foster was observed post Covid-19 immunization for 15 minutes without incident. She was provided with Vaccine Information Sheet and instruction to access the V-Safe system.   Paige Foster was instructed to call 911 with any severe reactions post vaccine: Marland Kitchen Difficulty breathing  . Swelling of face and throat  . A fast heartbeat  . A bad rash all over body  . Dizziness and weakness

## 2020-05-26 NOTE — Progress Notes (Signed)
[facility]  [phone] [phone]    ID: Paige Foster DOB: 1966-05-25  MR#: [id number]  POU#:[id number]  Patient Care Team: Anne Ng, NP as PCP - General (Internal Medicine) Allyn Bartelson, Valentino Hue, MD as Consulting Physician (Oncology) Emelia Loron, MD as Consulting Physician (General Surgery) Nche, Bonna Gains, NP as Nurse Practitioner (Internal Medicine) Carmela Rima, MD as Consulting Physician (Ophthalmology) Laurey Morale, MD as Consulting Physician (Cardiology) Louisa Second, MD as Consulting Physician (Plastic Surgery) OTHER MD:    CHIEF COMPLAINT: 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  She has been on anastrozole since December 2020. Her most recent bone density testing performed in 11/2015 at [facility] showed a T-score of -1.6.  Her most recent mammogram at [facility] was 02/19/2019.  It showed breast density category C.  She has not had a mammogram yet this year.  REVIEW OF SYSTEMS: Paige Foster continues to work for the [employer].  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  She says her family is fine.  She feels a little bit constipated although she does have a bowel movement daily and it is not hard.  She does not drink enough water she says.  Occasionally she has some leg cramps.  A detailed review of systems today was otherwise noncontributory   COVID 19 VACCINATION STATUS: Has had both Pfizer shots plus the booster October 2021   HISTORY OF CURRENT ILLNESS: From the original intake note:  Paige Foster has a prior history of left breast cancer, dating back to 2004. At that time she underwent a left mastectomy for stage 0 (noninvasive) breast cancer, with transverse rectus abdominis (TRAM) flap construction under Dr. Shon Hough.  She also underwent a right breast reduction. She took tamoxifen for three years.  More recently she underwent bilateral diagnostic mammography with tomography and left breast ultrasonography at [facility] on 01/03/2018 showing: Breast Density Category B. There is an oval fat containing lesion in the left breast upper outer quadrant posterior depth. No other significant masses, calcifications, or other findings are seen in either breast. Sonographically, there is a 1.5 cm lesion in the left breast upper outer quadrant posterior depth. This lesion is of mixed echogenicity. This correlates as palpated and with mammography findings. Follow up was recommended.  Close follow-up was suggested.  She then presented with a non-tender mass in the left reconstructed breast on 08/30/2018. On physical exam, there is a hard palpable lump measuring 2.0 cm in the upper outer left reconstructed breast 10 cm from the expected location of a nipple. Sonography over this area demonstrates a 1.8 cm x 1.7 cm x 1.4 cm mass in the left breast at 2 o'clock posterior depth 10 cm from the nipple. This mass is of mixed echogenicity. This abnormality is increased in size and correlates as palpated and with prior mammography findings. Color flow imaging demonstrates that there is vascularity present. Elastography imaging assessment is intermediate. No significant abnormalities were seen sonographically in the left axilla.    Accordingly on 08/30/2018 she proceeded to biopsy of the left breast mass in question. The pathology from this procedure showed ([id number]): invasive ductal carcinoma, grade III. Prognostic indicators significant for: estrogen receptor, 100% positive with strong staining intensity and progesterone receptor, 0% negative. Proliferation marker Ki67 at 15%. HER2 positive (3+) by immunohistochemistry.  The patient's  subsequent history is as detailed below.   PAST MEDICAL HISTORY: Past Medical History:  Diagnosis Date  .  Anemia   . History of blood transfusion 2004  . History of colon polyps   . Hypertension   . Recurrent breast cancer, left [facility]) oncologist-- dr Jana Hakim    dx 2004, noninvasive Stage 0 ----s/p left mastectomy w/ tram flap construction (and right breast reduction), taken Tamoxifen for 3 yrs;   08-30-2018 recurrent left cancer , Grade III,  cT1c,  ER positive, PR negative, HER-2 positive, invasive ductal carcinoma-- neoadjuvant chemo to start 09-26-2018  . Renal artery stenosis (HCC)    mild right external renal artery stenosis per duplex in epic 08-09-2013  . Wears glasses     PAST SURGICAL HISTORY: Past Surgical History:  Procedure Laterality Date  . BREAST LUMPECTOMY WITH RADIOACTIVE SEED LOCALIZATION Left 02/20/2019   Procedure: LEFT BREAST LUMPECTOMY WITH RADIOACTIVE SEED LOCALIZATION;  Surgeon: Rolm Bookbinder, MD;  Location: [place];  Service: General;  Laterality: Left;  . BREAST SURGERY Left    Transflap  . COLONOSCOPY    . MASTECTOMY Left 2004   w/  TRAM flap construction and right breast augmentation with abdominoplasy  . PARS PLANA VITRECTOMY Left 12/18/2018   Procedure: PARS PLANA VITRECTOMY WITH 25 GAUGE, ENDOLASER;  Surgeon: Jalene Mullet, MD;  Location: [place];  Service: Ophthalmology;  Laterality: Left;  . PORTACATH PLACEMENT N/A 09/25/2018   Procedure: INSERTION PORT-A-CATH WITH ULTRASOUND;  Surgeon: Rolm Bookbinder, MD;  Location: [facility];  Service: General;  Laterality: N/A;  . TUBAL LIGATION Bilateral yrs ago    FAMILY HISTORY: Family History  Problem Relation Age of Onset  . Diabetes Mother   . Hypertension Mother   . Kidney disease Father   . Colon cancer Neg Hx   . Colon polyps Neg Hx   . Gallbladder disease Neg Hx   . Heart disease Neg Hx   . Esophageal cancer Neg Hx    Francisco's father died from unknown causes in his early 71's. Patients' mother died from diabetes complications at age 68. The patient has 1 sister. Patient denies anyone in her family  having breast, ovarian, prostate, or pancreatic cancer.    GYNECOLOGIC HISTORY:  Patient's last menstrual period was 06/08/2007. 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 Her husband, Elwin Mocha, works at [employer]. Desirie has two children, Vonna Kotyk and Shanon Brow. Vonna Kotyk lives with her, is 33, and it attending [employer] for a computer based degree. Shanon Brow lives with her, is 69, and recently graduated from [employer] with a degree in Careers information officer.  He works for [employer]. Renatha has no grandchildren. She attends the [employer]: In the absence of any documents to the contrary her husband, Elwin Mocha, is automatically her healthcare power of attorney     HEALTH MAINTENANCE: Social History   Tobacco Use  . Smoking status: Never Smoker  . Smokeless tobacco: Never Used  Vaping Use  . Vaping Use: Never used  Substance Use Topics  . Alcohol use: Not Currently    Alcohol/week: 0.0 standard drinks    Comment: Occassionally  . Drug use: No    Colonoscopy: yes  PAP:   Bone density: yes, 2017; -1.6, osteopenic   No Known Allergies  Current Outpatient Medications  Medication Sig  Dispense Refill  . amLODipine (NORVASC) 10 MG tablet Take 1 tablet (10 mg total) by mouth daily. 90 tablet 3  . anastrozole (ARIMIDEX) 1 MG tablet Take 1 tablet (1 mg total) by mouth daily. 90 tablet 4  . Ascorbic Acid (VITAMIN C) 1000 MG tablet Take 1,000 mg by mouth daily.    . Flaxseed, Linseed, (FLAXSEED OIL) 1000 MG CAPS Take 1,000 mg by mouth daily.    . Influenza Vac Typ A&B Surf Ant 0.5 ML SUSY Fluvirin 2017-2018 (PF) 45 mcg(15 mcg x3)/0.5 mL intramuscular syringe    . LORazepam (ATIVAN) 0.5 MG tablet Take 1 tablet (0.5 mg total) by mouth every 8 (eight) hours as needed for anxiety or sleep. 30 tablet 0  .  losartan (COZAAR) 50 MG tablet Take 1 tablet (50 mg total) by mouth daily. 90 tablet 3  . Polyethyl Glycol-Propyl Glycol (SYSTANE) 0.4-0.3 % SOLN Place 1 drop into both eyes 4 (four) times daily as needed (for dry eyes).    . prednisoLONE acetate (PRED FORTE) 1 % ophthalmic suspension Place 1 drop into the left eye 4 (four) times daily.     No current facility-administered medications for this visit.     OBJECTIVE: African-American woman who appears stated age  12:   05/27/20 1126  BP: 109/70  Pulse: 70  Resp: 18  Temp: 97.6 F (36.4 C)  SpO2: 100%     Body mass index is 24.37 kg/m.   Wt Readings from Last 3 Encounters:  05/27/20 142 lb (64.4 kg)  01/02/20 134 lb 12.8 oz (61.1 kg)  12/06/19 134 lb 6.4 oz (61 kg)   Sclerae unicteric, EOMs intact Wearing a mask No cervical or supraclavicular adenopathy Lungs no rales or rhonchi Heart regular rate and rhythm Abd soft, nontender, positive bowel sounds MSK no focal spinal tenderness, no upper extremity lymphedema Neuro: nonfocal, well oriented, appropriate affect Breasts: The right breast is status post reduction mammoplasty.  It is otherwise unremarkable.  The left breast is status post mastectomy with reconstruction.  There is no evidence of local recurrence.  Both axillae are benign.   LAB RESULTS:  CMP     Component Value Date/Time   NA 142 12/06/2019 1441   NA 140 10/12/2017 0950   K 3.9 12/06/2019 1441   CL 107 12/06/2019 1441   CO2 28 12/06/2019 1441   GLUCOSE 94 12/06/2019 1441   BUN 15 12/06/2019 1441   BUN 10 10/12/2017 0950   CREATININE 0.74 12/06/2019 1441   CREATININE 0.78 09/07/2018 1455   CALCIUM 9.4 12/06/2019 1441   PROT 6.9 12/06/2019 1441   PROT 7.5 10/12/2017 0950   ALBUMIN 3.6 12/06/2019 1441   ALBUMIN 4.3 10/12/2017 0950   AST 14 (L) 12/06/2019 1441   AST 14 (L) 09/07/2018 1455   ALT 12 12/06/2019 1441   ALT 13 09/07/2018 1455   ALKPHOS 81 12/06/2019 1441   BILITOT <0.2 (L) 12/06/2019  1441   BILITOT 0.2 (L) 09/07/2018 1455   GFRNONAA >60 12/06/2019 1441   GFRNONAA >60 09/07/2018 1455   GFRAA >60 12/06/2019 1441   GFRAA >60 09/07/2018 1455   Lab Results  Component Value Date   WBC 3.7 (L) 05/27/2020   NEUTROABS 1.6 (L) 05/27/2020   HGB 11.8 (L) 05/27/2020   HCT 35.6 (L) 05/27/2020   MCV 95.7 05/27/2020   PLT 224 05/27/2020    Lab Results  Component Value Date   LABCA2 <4 08/30/2007    No components found for: [id number]  No  results for input(s): INR in the last 168 hours.  Lab Results  Component Value Date   LABCA2 <4 08/30/2007    No results found for: DJS970  No results found for: YOV785  No results found for: YIF027  No results found for: CA2729  No components found for: HGQUANT  No results found for: CEA1 / No results found for: CEA1  No results found for: AFPTUMOR  No results found for: CHROMOGRNA  No results found for: TOTALPROTELP, ALBUMINELP, A1GS, A2GS, BETS, BETA2SER, GAMS, MSPIKE, SPEI (this displays SPEP labs)  No results found for: KPAFRELGTCHN, LAMBDASER, KAPLAMBRATIO (kappa/lambda light chains)  No results found for: HGBA, HGBA2QUANT, HGBFQUANT, HGBSQUAN (Hemoglobinopathy evaluation)   Lab Results  Component Value Date   LDH 169 08/30/2007    Lab Results  Component Value Date   IRON 78 01/01/2009   TIBC 424 08/30/2007   IRONPCTSAT 17.9 (L) 01/01/2009   (Iron and TIBC)  Lab Results  Component Value Date   FERRITIN 19 08/30/2007    Urinalysis    Component Value Date/Time   LABSPEC 1.015 04/09/2008 1548   PHURINE 7.0 04/09/2008 1548   HGBUR large 04/09/2008 1548   BILIRUBINUR negative 04/09/2008 1548   UROBILINOGEN 0.2 04/09/2008 1548   NITRITE negative 04/09/2008 1548     STUDIES:  No results found.  ELIGIBLE FOR AVAILABLE RESEARCH PROTOCOL: No   ASSESSMENT: 54 y.o. [place], [place] woman  (1) history of left-sided ductal carcinoma in situ 2004  (a) s/p left mastectomy with TRAM  reconstruction  (b) status post tamoxifen x3 years  (2) left breast upper outer quadrant biopsy 08/30/2018 shows a clinical T1c N0 invasive ductal carcinoma, grade 3, estrogen receptor strongly positive, progesterone receptor negative, with HER-2 amplification, and and MIB-1 of 15%.   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    (b) chemotherapy discontinued after 4 cycles because of intercurrent eye surgery  (c) continued trastuzumab and pertuzumab to complete a year (last dose 09/06/2019)  (d) echocardiogram on 01/23/2019 that shows well preserved EF of 60-65%  (e) echocardiogram on 04/23/2019 shows EF of 60-65%  (f) echocardiogram 08/02/2019 shows an ejection fraction in the 60-65% range  (4) left lumpectomy 02/20/2019 showed a residual  ypT1c NX invasive ductal carcinoma, grade 2 with negative margins.    (5) adjuvant radiation: Radiation Treatment Dates: 04/12/2019 through 05/28/2019 Site Technique Total Dose (Gy) Dose per Fx (Gy) Completed Fx Beam Energies  Breast: CW_Lt 3D 50.4/50.4 1.8 28/28 6X, 10X  Breast: CW_Lt_SCV_PAB 3D 50.4/50.4 1.8 28/28 6X, 10X  Breast: CW_Lt_Bst Electron 10/10 2 5/5 6X, 10X   (6) anastrozole started 06/26/2019  (a) DEXA scan at [facility] 12/24/2015 found a T score of -1.6  (7) genetics testing 02/03/2019 through the Common Hereditary Cancers Panel offered by [facility] found no deleterious mutations in APC, ATM, AXIN2, BARD1, BMPR1A, BRCA1, BRCA2, BRIP1, CDH1, CDKN2A (p14ARF), CDKN2A (p16INK4a), CKD4, CHEK2, CTNNA1, DICER1, EPCAM (Deletion/duplication testing only), GREM1 (promoter region deletion/duplication testing only), KIT, MEN1, MLH1, MSH2,  MSH3, MSH6, MUTYH, NBN, NF1, NHTL1, PALB2, PDGFRA, PMS2, POLD1, POLE, PTEN, RAD50, RAD51C, RAD51D, RNF43, SDHB, SDHC, SDHD, SMAD4, SMARCA4. STK11, TP53, TSC1, TSC2, and VHL.  The following genes were evaluated for sequence changes only: SDHA and HOXB13  c.251G>A variant only.   PLAN: Nyela is now a little over a year out from her recurrent breast cancer, with no evidence of disease activity.  This is very febrile.  She is tolerating anastrozole well and the plan is to continue that a minimum of 5 years.  She is a bit behind on her mammography and she also needs a bone density.  However she does not want to go back to either [facility] or the [facility] because she felt they minimized her findings originally and she had to push to get a biopsy.  She would like to go to [place].  I have entered the appropriate orders  She will see me again in 6 months.  Assuming everything is well at that time I will start seeing her on a once a year basis thereafter  Total encounter time 25 minutes.Chauncey Cruel, MD Medical Oncology and Hematology [facility] [place], [place] [address] Tel. [phone]    Fax. [phone]   I, Wilburn Mylar, am acting as scribe for Dr. Virgie Dad. Michail Boyte.  I, Lurline Del MD, have reviewed the above documentation for accuracy and completeness, and I agree with the above.   *Total Encounter Time as defined by the Centers for Medicare and Medicaid Services includes, in addition to the face-to-face time of a patient visit (documented in the note above) non-face-to-face time: obtaining and reviewing outside history, ordering and reviewing medications, tests or procedures, care coordination (communications with other health care professionals or caregivers) and documentation in the medical record.

## 2020-05-27 ENCOUNTER — Other Ambulatory Visit: Payer: Self-pay

## 2020-05-27 ENCOUNTER — Inpatient Hospital Stay (HOSPITAL_BASED_OUTPATIENT_CLINIC_OR_DEPARTMENT_OTHER): Payer: BC Managed Care – PPO | Admitting: Oncology

## 2020-05-27 ENCOUNTER — Inpatient Hospital Stay: Payer: BC Managed Care – PPO | Attending: Oncology

## 2020-05-27 VITALS — BP 109/70 | HR 70 | Temp 97.6°F | Resp 18 | Ht 64.0 in | Wt 142.0 lb

## 2020-05-27 DIAGNOSIS — Z79899 Other long term (current) drug therapy: Secondary | ICD-10-CM | POA: Insufficient documentation

## 2020-05-27 DIAGNOSIS — C50412 Malignant neoplasm of upper-outer quadrant of left female breast: Secondary | ICD-10-CM | POA: Diagnosis not present

## 2020-05-27 DIAGNOSIS — C50912 Malignant neoplasm of unspecified site of left female breast: Secondary | ICD-10-CM | POA: Diagnosis not present

## 2020-05-27 DIAGNOSIS — Z923 Personal history of irradiation: Secondary | ICD-10-CM | POA: Insufficient documentation

## 2020-05-27 DIAGNOSIS — Z9012 Acquired absence of left breast and nipple: Secondary | ICD-10-CM | POA: Insufficient documentation

## 2020-05-27 DIAGNOSIS — Z170 Estrogen receptor positive status [ER+]: Secondary | ICD-10-CM | POA: Insufficient documentation

## 2020-05-27 DIAGNOSIS — Z79811 Long term (current) use of aromatase inhibitors: Secondary | ICD-10-CM | POA: Diagnosis not present

## 2020-05-27 DIAGNOSIS — M8580 Other specified disorders of bone density and structure, unspecified site: Secondary | ICD-10-CM | POA: Diagnosis not present

## 2020-05-27 LAB — CBC WITH DIFFERENTIAL/PLATELET
Abs Immature Granulocytes: 0.01 K/uL (ref 0.00–0.07)
Basophils Absolute: 0 K/uL (ref 0.0–0.1)
Basophils Relative: 1 %
Eosinophils Absolute: 0 K/uL (ref 0.0–0.5)
Eosinophils Relative: 1 %
HCT: 35.6 % — ABNORMAL LOW (ref 36.0–46.0)
Hemoglobin: 11.8 g/dL — ABNORMAL LOW (ref 12.0–15.0)
Immature Granulocytes: 0 %
Lymphocytes Relative: 42 %
Lymphs Abs: 1.6 K/uL (ref 0.7–4.0)
MCH: 31.7 pg (ref 26.0–34.0)
MCHC: 33.1 g/dL (ref 30.0–36.0)
MCV: 95.7 fL (ref 80.0–100.0)
Monocytes Absolute: 0.5 K/uL (ref 0.1–1.0)
Monocytes Relative: 13 %
Neutro Abs: 1.6 K/uL — ABNORMAL LOW (ref 1.7–7.7)
Neutrophils Relative %: 43 %
Platelets: 224 K/uL (ref 150–400)
RBC: 3.72 MIL/uL — ABNORMAL LOW (ref 3.87–5.11)
RDW: 11.9 % (ref 11.5–15.5)
WBC: 3.7 K/uL — ABNORMAL LOW (ref 4.0–10.5)
nRBC: 0 % (ref 0.0–0.2)

## 2020-05-27 LAB — COMPREHENSIVE METABOLIC PANEL
ALT: 14 U/L (ref 0–44)
AST: 16 U/L (ref 15–41)
Albumin: 3.9 g/dL (ref 3.5–5.0)
Alkaline Phosphatase: 75 U/L (ref 38–126)
Anion gap: 7 (ref 5–15)
BUN: 12 mg/dL (ref 6–20)
CO2: 27 mmol/L (ref 22–32)
Calcium: 9.5 mg/dL (ref 8.9–10.3)
Chloride: 105 mmol/L (ref 98–111)
Creatinine, Ser: 0.68 mg/dL (ref 0.44–1.00)
GFR, Estimated: >60 mL/min (ref >60)
Glucose, Bld: 93 mg/dL (ref 70–99)
Potassium: 3.5 mmol/L (ref 3.5–5.1)
Sodium: 139 mmol/L (ref 135–145)
Total Bilirubin: 0.2 mg/dL — ABNORMAL LOW (ref 0.3–1.2)
Total Protein: 7.2 g/dL (ref 6.5–8.1)

## 2020-05-27 MED ORDER — ANASTROZOLE 1 MG PO TABS
1.0000 mg | ORAL_TABLET | Freq: Every day | ORAL | 4 refills | Status: DC
Start: 2020-05-27 — End: 2021-06-02

## 2020-05-30 ENCOUNTER — Telehealth: Payer: Self-pay | Admitting: Oncology

## 2020-05-30 NOTE — Telephone Encounter (Signed)
Scheduled appt per 11/2 los - called pt - no answer. Left message for patient with appt date and time

## 2020-07-03 ENCOUNTER — Encounter: Payer: BC Managed Care – PPO | Admitting: Nurse Practitioner

## 2020-07-30 ENCOUNTER — Other Ambulatory Visit: Payer: Self-pay | Admitting: Oncology

## 2020-08-13 ENCOUNTER — Other Ambulatory Visit (HOSPITAL_COMMUNITY)
Admission: RE | Admit: 2020-08-13 | Discharge: 2020-08-13 | Disposition: A | Discharge status: Home / Self Care | Payer: BC Managed Care – PPO | Source: Ambulatory Visit | Attending: Nurse Practitioner | Admitting: Nurse Practitioner

## 2020-08-13 ENCOUNTER — Encounter: Payer: Self-pay | Admitting: Nurse Practitioner

## 2020-08-13 ENCOUNTER — Ambulatory Visit (INDEPENDENT_AMBULATORY_CARE_PROVIDER_SITE_OTHER): Payer: BC Managed Care – PPO | Admitting: Nurse Practitioner

## 2020-08-13 ENCOUNTER — Other Ambulatory Visit: Payer: Self-pay

## 2020-08-13 VITALS — BP 120/80 | HR 60 | Temp 97.1°F | Ht 62.0 in | Wt 142.4 lb

## 2020-08-13 DIAGNOSIS — I10 Essential (primary) hypertension: Secondary | ICD-10-CM | POA: Diagnosis not present

## 2020-08-13 DIAGNOSIS — Z136 Encounter for screening for cardiovascular disorders: Secondary | ICD-10-CM

## 2020-08-13 DIAGNOSIS — C50412 Malignant neoplasm of upper-outer quadrant of left female breast: Secondary | ICD-10-CM

## 2020-08-13 DIAGNOSIS — M8580 Other specified disorders of bone density and structure, unspecified site: Secondary | ICD-10-CM

## 2020-08-13 DIAGNOSIS — D649 Anemia, unspecified: Secondary | ICD-10-CM

## 2020-08-13 DIAGNOSIS — Z13220 Encounter for screening for lipoid disorders: Secondary | ICD-10-CM

## 2020-08-13 DIAGNOSIS — Z124 Encounter for screening for malignant neoplasm of cervix: Secondary | ICD-10-CM | POA: Diagnosis present

## 2020-08-13 DIAGNOSIS — Z170 Estrogen receptor positive status [ER+]: Secondary | ICD-10-CM

## 2020-08-13 DIAGNOSIS — Z860100 Personal history of colon polyps, unspecified: Secondary | ICD-10-CM

## 2020-08-13 DIAGNOSIS — Z0000 Encounter for general adult medical examination without abnormal findings: Secondary | ICD-10-CM | POA: Diagnosis not present

## 2020-08-13 LAB — LIPID PANEL
Cholesterol: 285 mg/dL — ABNORMAL HIGH (ref 0–200)
HDL: 67.7 mg/dL (ref >39.00)
LDL Cholesterol: 192 mg/dL — ABNORMAL HIGH (ref 0–99)
NonHDL: 216.91
Total CHOL/HDL Ratio: 4
Triglycerides: 124 mg/dL (ref 0.0–149.0)
VLDL: 24.8 mg/dL (ref 0.0–40.0)

## 2020-08-13 LAB — TSH: TSH: 0.81 u[IU]/mL (ref 0.35–4.50)

## 2020-08-13 MED ORDER — LOSARTAN POTASSIUM 50 MG PO TABS: 50.0000 mg | ORAL_TABLET | Freq: Every day | ORAL | 3 refills | Status: DC

## 2020-08-13 MED ORDER — AMLODIPINE BESYLATE 10 MG PO TABS: 10.0000 mg | ORAL_TABLET | Freq: Every day | ORAL | 3 refills | Status: DC

## 2020-08-13 NOTE — Progress Notes (Signed)
Subjective:    Patient ID: Paige Foster, female    DOB: [birth year], 55 y.o.   MRN: [id number]  Patient presents today for CPE and eval of chronic conditions  HPI HTN (hypertension) BP at goal with losartan and amlodipine BP Readings from Last 3 Encounters:  08/13/20 120/80  05/27/20 109/70  01/02/20 128/64   Reviewed CMP completed by oncology: stable renal function. Maintain current medications  PERSONAL HX COLONIC POLYPS Due to repeat colonoscopy Chronic constipation, no blood in stool, no nausea, no ABD pain, no weight loss. She agreed to schedule appt with GI   Sexual History (orientation,birth control, marital status, STD):not sexually active, needs repeat PAP, declined breast exam, request for mammogram order to be sent to [facility]. She is not satisfied with her care at [facility] and [facility].  Depression/Suicide: Depression screen [facility] 2/9 08/13/2020 01/02/2020 08/30/2018 05/22/2018 10/12/2017 09/15/2017 06/04/2016  Decreased Interest 0 0 0 0 0 0 0  Down, Depressed, Hopeless 0 0 0 0 0 0 0  PHQ - 2 Score 0 0 0 0 0 0 0  Altered sleeping 1 - - - - - -  Tired, decreased energy 0 - - - - - -  Change in appetite 0 - - - - - -  Feeling bad or failure about yourself  0 - - - - - -  Trouble concentrating 0 - - - - - -  Moving slowly or fidgety/restless 0 - - - - - -  Suicidal thoughts 0 - - - - - -  PHQ-9 Score 1 - - - - - -  Difficult doing work/chores Not difficult at all - - - - - -  Some recent data might be hidden   Vision:wil schedule appt  Dental:will schedule appt  Immunizations: (TDAP, Hep C screen, Pneumovax, Influenza, zoster)  Health Maintenance  Topic Date Due  . Pap Smear  05/27/2015  . DEXA scan (bone density measurement)  12/23/2017  . Colon Cancer Screening  12/18/2019  . Mammogram  02/19/2020  . Flu Shot  10/23/2020*  .  Hepatitis C: One time screening is recommended by Center for Disease Control  (CDC) for  adults born from  [birth year] through 1966.   08/13/2021*  . HIV Screening  08/13/2021*  . COVID-19 Vaccine (4 - Booster for Pfizer series) 11/22/2020  . Tetanus Vaccine  07/11/2023  *Topic was postponed. The date shown is not the original due date.   Diet:regular. Exercise: none Weight:  Wt Readings from Last 3 Encounters:  08/13/20 142 lb 6.4 oz (64.6 kg)  05/27/20 142 lb (64.4 kg)  01/02/20 134 lb 12.8 oz (61.1 kg)   Fall Risk: Fall Risk  01/02/2020 08/30/2018 05/22/2018 09/15/2017 06/04/2016  Falls in the past year? 0 0 No No No  Number falls in past yr: 0 - - - -  Injury with Fall? 0 - - - -   Medications and allergies reviewed with patient and updated if appropriate.  Patient Active Problem List   Diagnosis Date Noted  . Osteopenia 08/16/2019  . Genetic testing 02/05/2019  . Port-A-Cath in place 09/28/2018  . Recurrent breast cancer, left ([place]) 09/07/2018  . Malignant neoplasm of upper-outer quadrant of left breast in female, estrogen receptor positive ([place]) 09/06/2018  . Dyspepsia 11/25/2014  . HTN (hypertension) 07/10/2013  . HEMORRHOIDS-INTERNAL 04/23/2009  . PERSONAL HX COLONIC POLYPS 04/23/2009  . HEMORRHOID, THROMBOSED 03/12/2009    Current Outpatient Medications on File Prior to Visit  Medication Sig Dispense Refill  . anastrozole (ARIMIDEX) 1 MG tablet Take 1 tablet (1 mg total) by mouth daily. 90 tablet 4  . prednisoLONE acetate (PRED FORTE) 1 % ophthalmic suspension Place 1 drop into the left eye 4 (four) times daily.    . [DISCONTINUED] prochlorperazine (COMPAZINE) 10 MG tablet Take 1 tablet (10 mg total) by mouth every 6 (six) hours as needed (Nausea or vomiting). 30 tablet 1   No current facility-administered medications on file prior to visit.    Past Medical History:  Diagnosis Date  . Anemia   . History of blood transfusion 2004  . History of colon polyps   . Hypertension   . Recurrent breast cancer, left [facility]) oncologist-- dr Jana Hakim    dx 2004, noninvasive Stage 0  ----s/p left mastectomy w/ tram flap construction (and right breast reduction), taken Tamoxifen for 3 yrs;   08-30-2018 recurrent left cancer , Grade III,  cT1c,  ER positive, PR negative, HER-2 positive, invasive ductal carcinoma-- neoadjuvant chemo to start 09-26-2018  . Renal artery stenosis (HCC)    mild right external renal artery stenosis per duplex in epic 08-09-2013  . Wears glasses     Past Surgical History:  Procedure Laterality Date  . BREAST LUMPECTOMY WITH RADIOACTIVE SEED LOCALIZATION Left 02/20/2019   Procedure: LEFT BREAST LUMPECTOMY WITH RADIOACTIVE SEED LOCALIZATION;  Surgeon: Rolm Bookbinder, MD;  Location: [place];  Service: General;  Laterality: Left;  . BREAST SURGERY Left    Transflap  . COLONOSCOPY    . MASTECTOMY Left 2004   w/  TRAM flap construction and right breast augmentation with abdominoplasy  . PARS PLANA VITRECTOMY Left 12/18/2018   Procedure: PARS PLANA VITRECTOMY WITH 25 GAUGE, ENDOLASER;  Surgeon: Jalene Mullet, MD;  Location: [facility];  Service: Ophthalmology;  Laterality: Left;  . PORTACATH PLACEMENT N/A 09/25/2018   Procedure: INSERTION PORT-A-CATH WITH ULTRASOUND;  Surgeon: Rolm Bookbinder, MD;  Location: [facility];  Service: General;  Laterality: N/A;  . TUBAL LIGATION Bilateral yrs ago    Social History   Socioeconomic History  . Marital status: Married    Spouse name: Not on file  . Number of children: 2  . Years of education: Not on file  . Highest education level: Not on file  Occupational History  . Occupation: [employer]  Tobacco Use  . Smoking status: Never Smoker  . Smokeless tobacco: Never Used  Vaping Use  . Vaping Use: Never used  Substance and Sexual Activity  . Alcohol use: Not Currently    Alcohol/week: 0.0 standard drinks    Comment: Occassionally  . Drug use: No  . Sexual activity: Not on file  Other Topics Concern  . Not on file  Social History Narrative  . Not on file   Social Determinants of Health   Financial  Resource Strain: Not on file  Food Insecurity: Not on file  Transportation Needs: Not on file  Physical Activity: Not on file  Stress: Not on file  Social Connections: Not on file   Family History  Problem Relation Age of Onset  . Diabetes Mother   . Hypertension Mother   . Kidney disease Father   . Colon cancer Neg Hx   . Colon polyps Neg Hx   . Gallbladder disease Neg Hx   . Heart disease Neg Hx   . Esophageal cancer Neg Hx        Review of Systems  Constitutional: Negative for fever, malaise/fatigue and weight loss.  HENT: Negative  for congestion and sore throat.   Eyes:       Negative for visual changes  Respiratory: Negative for cough and shortness of breath.   Cardiovascular: Negative for chest pain, palpitations and leg swelling.  Gastrointestinal: Positive for constipation. Negative for abdominal pain, blood in stool, diarrhea, heartburn, nausea and vomiting.  Genitourinary: Negative for dysuria, frequency and urgency.  Musculoskeletal: Negative for falls, joint pain and myalgias.  Skin: Negative for rash.  Neurological: Negative for dizziness, sensory change and headaches.  Endo/Heme/Allergies: Does not bruise/bleed easily.  Psychiatric/Behavioral: Negative for depression, substance abuse and suicidal ideas. The patient is not nervous/anxious.    Objective:   Vitals:   08/13/20 1023  BP: 120/80  Pulse: 60  Temp: (!) 97.1 F (36.2 C)  SpO2: 100%    Body mass index is 26.05 kg/m.   Physical Examination:  Physical Exam Vitals and nursing note reviewed. Exam conducted with a chaperone present.  Constitutional:      General: She is not in acute distress. HENT:     Right Ear: Tympanic membrane, ear canal and external ear normal.     Left Ear: Tympanic membrane, ear canal and external ear normal.     Mouth/Throat:     Mouth: Oropharynx is clear and moist.  Eyes:     General: No scleral icterus.    Extraocular Movements: Extraocular movements intact and  EOM normal.     Conjunctiva/sclera: Conjunctivae normal.  Neck:     Thyroid: No thyromegaly.  Cardiovascular:     Rate and Rhythm: Normal rate.     Pulses: Normal pulses and intact distal pulses.     Heart sounds: Normal heart sounds.  Pulmonary:     Effort: Pulmonary effort is normal.     Breath sounds: Normal breath sounds.  Chest:     Chest wall: No tenderness.  Abdominal:     General: Bowel sounds are normal. There is no distension.     Palpations: Abdomen is soft.     Tenderness: There is no abdominal tenderness.     Hernia: There is no hernia in the left inguinal area or right inguinal area.  Genitourinary:    Pubic Area: No rash.      Labia:        Right: No rash, tenderness or lesion.        Left: No rash, tenderness or lesion.      Urethra: Prolapse present. No urethral pain.     Vagina: Normal.     Cervix: Normal.     Uterus: Not enlarged and not tender.      Adnexa: Right adnexa normal and left adnexa normal.     Rectum: No external hemorrhoid.  Musculoskeletal:        General: No tenderness or edema. Normal range of motion.     Cervical back: Normal range of motion and neck supple.  Lymphadenopathy:     Cervical: No cervical adenopathy.     Lower Body: No right inguinal adenopathy.  Skin:    General: Skin is warm and dry.  Neurological:     Mental Status: She is alert and oriented to person, place, and time.  Psychiatric:        Mood and Affect: Mood normal.        Behavior: Behavior normal.        Thought Content: Thought content normal.        Judgment: Judgment normal.    ASSESSMENT and PLAN: This visit occurred during  the SARS-CoV-2 public health emergency.  Safety protocols were in place, including screening questions prior to the visit, additional usage of staff PPE, and extensive cleaning of exam room while observing appropriate contact time as indicated for disinfecting solutions.   Mary was seen today for annual exam.  Diagnoses and all orders  for this visit:  Preventative health care -     Cytology - PAP( [facility]) -     Lipid panel  Primary hypertension -     TSH  Encounter for lipid screening for cardiovascular disease -     Lipid panel  Encounter for Papanicolaou smear for cervical cancer screening -     Cytology - PAP( [place])  Anemia, unspecified type -     Iron, TIBC and Ferritin Panel  Malignant neoplasm of upper-outer quadrant of left breast in female, estrogen receptor positive ([place]) -     MM DIAG BREAST TOMO BILATERAL; Future -     MS DIGITAL SCREENING TOMO UNI RIGHT; Future -     DG Bone Density; Future  Osteopenia, unspecified location -     DG Bone Density; Future  Essential hypertension -     amLODipine (NORVASC) 10 MG tablet; Take 1 tablet (10 mg total) by mouth daily. -     losartan (COZAAR) 50 MG tablet; Take 1 tablet (50 mg total) by mouth daily.  PERSONAL HX COLONIC POLYPS      Problem List Items Addressed This Visit      Cardiovascular and Mediastinum   HTN (hypertension)    BP at goal with losartan and amlodipine BP Readings from Last 3 Encounters:  08/13/20 120/80  05/27/20 109/70  01/02/20 128/64   Reviewed CMP completed by oncology: stable renal function. Maintain current medications      Relevant Medications   amLODipine (NORVASC) 10 MG tablet   losartan (COZAAR) 50 MG tablet   Other Relevant Orders   TSH     Musculoskeletal and Integument   Osteopenia   Relevant Orders   DG Bone Density     Other   Malignant neoplasm of upper-outer quadrant of left breast in female, estrogen receptor positive ([place])   Relevant Orders   MM DIAG BREAST TOMO BILATERAL   MS DIGITAL SCREENING TOMO UNI RIGHT   DG Bone Density   PERSONAL HX COLONIC POLYPS    Due to repeat colonoscopy Chronic constipation, no blood in stool, no nausea, no ABD pain, no weight loss. She agreed to schedule appt with GI        Other Visit Diagnoses    Preventative health care    -  Primary    Relevant Orders   Cytology - PAP( [place])   Lipid panel   Encounter for lipid screening for cardiovascular disease       Relevant Orders   Lipid panel   Encounter for Papanicolaou smear for cervical cancer screening       Relevant Orders   Cytology - PAP( [place])   Anemia, unspecified type       Relevant Orders   Iron, TIBC and Ferritin Panel   Essential hypertension       Relevant Medications   amLODipine (NORVASC) 10 MG tablet   losartan (COZAAR) 50 MG tablet      Follow up: Return in about 6 months (around 02/10/2021) for HTN.  Wilfred Lacy, NP

## 2020-08-13 NOTE — Patient Instructions (Addendum)
Schedule repeat colonoscopy and mammogram Go to lab for blood draw   Preventive Care 18-56 Years Old, Female Preventive care refers to lifestyle choices and visits with your health care provider that can promote health and wellness. This includes:  A yearly physical exam. This is also called an annual wellness visit.  Regular dental and eye exams.  Immunizations.  Screening for certain conditions.  Healthy lifestyle choices, such as: ? Eating a healthy diet. ? Getting regular exercise. ? Not using drugs or products that contain nicotine and tobacco. ? Limiting alcohol use. What can I expect for my preventive care visit? Physical exam Your health care provider will check your:  Height and weight. These may be used to calculate your BMI (body mass index). BMI is a measurement that tells if you are at a healthy weight.  Heart rate and blood pressure.  Body temperature.  Skin for abnormal spots. Counseling Your health care provider may ask you questions about your:  Past medical problems.  Family's medical history.  Alcohol, tobacco, and drug use.  Emotional well-being.  Home life and relationship well-being.  Sexual activity.  Diet, exercise, and sleep habits.  Work and work Astronomer.  Access to firearms.  Method of birth control.  Menstrual cycle.  Pregnancy history. What immunizations do I need? Vaccines are usually given at various ages, according to a schedule. Your health care provider will recommend vaccines for you based on your age, medical history, and lifestyle or other factors, such as travel or where you work.   What tests do I need? Blood tests  Lipid and cholesterol levels. These may be checked every 5 years, or more often if you are over 26 years old.  Hepatitis C test.  Hepatitis B test. Screening  Lung cancer screening. You may have this screening every year starting at age 63 if you have a 30-pack-year history of smoking and  currently smoke or have quit within the past 15 years.  Colorectal cancer screening. ? All adults should have this screening starting at age 94 and continuing until age 76. ? Your health care provider may recommend screening at age 84 if you are at increased risk. ? You will have tests every 1-10 years, depending on your results and the type of screening test.  Diabetes screening. ? This is done by checking your blood sugar (glucose) after you have not eaten for a while (fasting). ? You may have this done every 1-3 years.  Mammogram. ? This may be done every 1-2 years. ? Talk with your health care provider about when you should start having regular mammograms. This may depend on whether you have a family history of breast cancer.  BRCA-related cancer screening. This may be done if you have a family history of breast, ovarian, tubal, or peritoneal cancers.  Pelvic exam and Pap test. ? This may be done every 3 years starting at age 20. ? Starting at age 64, this may be done every 5 years if you have a Pap test in combination with an HPV test. Other tests  STD (sexually transmitted disease) testing, if you are at risk.  Bone density scan. This is done to screen for osteoporosis. You may have this scan if you are at high risk for osteoporosis. Talk with your health care provider about your test results, treatment options, and if necessary, the need for more tests. Follow these instructions at home: Eating and drinking  Eat a diet that includes fresh fruits and vegetables, whole  grains, lean protein, and low-fat dairy products.  Take vitamin and mineral supplements as recommended by your health care provider.  Do not drink alcohol if: ? Your health care provider tells you not to drink. ? You are pregnant, may be pregnant, or are planning to become pregnant.  If you drink alcohol: ? Limit how much you have to 0-1 drink a day. ? Be aware of how much alcohol is in your drink. In the  [place] one drink equals one 12 oz bottle of beer (355 mL), one 5 oz glass of wine (148 mL), or one 1 oz glass of hard liquor (44 mL).   Lifestyle  Take daily care of your teeth and gums. Brush your teeth every morning and night with fluoride toothpaste. Floss one time each day.  Stay active. Exercise for at least 30 minutes 5 or more days each week.  Do not use any products that contain nicotine or tobacco, such as cigarettes, e-cigarettes, and chewing tobacco. If you need help quitting, ask your health care provider.  Do not use drugs.  If you are sexually active, practice safe sex. Use a condom or other form of protection to prevent STIs (sexually transmitted infections).  If you do not wish to become pregnant, use a form of birth control. If you plan to become pregnant, see your health care provider for a prepregnancy visit.  If told by your health care provider, take low-dose aspirin daily starting at age 19.  Find healthy ways to cope with stress, such as: ? Meditation, yoga, or listening to music. ? Journaling. ? Talking to a trusted person. ? Spending time with friends and family. Safety  Always wear your seat belt while driving or riding in a vehicle.  Do not drive: ? If you have been drinking alcohol. Do not ride with someone who has been drinking. ? When you are tired or distracted. ? While texting.  Wear a helmet and other protective equipment during sports activities.  If you have firearms in your house, make sure you follow all gun safety procedures. What's next?  Visit your health care provider once a year for an annual wellness visit.  Ask your health care provider how often you should have your eyes and teeth checked.  Stay up to date on all vaccines. This information is not intended to replace advice given to you by your health care provider. Make sure you discuss any questions you have with your health care provider. Document Revised: 04/15/2020 Document  Reviewed: 03/23/2018 Elsevier Patient Education  2021 [employer].

## 2020-08-13 NOTE — Assessment & Plan Note (Signed)
Due to repeat colonoscopy Chronic constipation, no blood in stool, no nausea, no ABD pain, no weight loss. She agreed to schedule appt with GI

## 2020-08-13 NOTE — Assessment & Plan Note (Signed)
BP at goal with losartan and amlodipine BP Readings from Last 3 Encounters:  08/13/20 120/80  05/27/20 109/70  01/02/20 128/64   Reviewed CMP completed by oncology: stable renal function. Maintain current medications

## 2020-08-14 LAB — CYTOLOGY - PAP
Comment: NEGATIVE
Diagnosis: NEGATIVE
High risk HPV: NEGATIVE

## 2020-08-14 LAB — IRON,TIBC AND FERRITIN PANEL
%SAT: 27 % (calc) (ref 16–45)
Ferritin: 122 ng/mL (ref 16–232)
Iron: 109 ug/dL (ref 45–160)
TIBC: 400 mcg/dL (calc) (ref 250–450)

## 2020-08-18 ENCOUNTER — Telehealth: Payer: Self-pay | Admitting: Nurse Practitioner

## 2020-08-18 NOTE — Telephone Encounter (Signed)
Pt returned call for lab results. Advised her to expect call back. Ph# [phone]

## 2020-08-18 NOTE — Telephone Encounter (Signed)
LVM for patient to return call. 

## 2020-08-26 ENCOUNTER — Telehealth: Payer: Self-pay

## 2020-08-26 NOTE — Telephone Encounter (Signed)
Paige Foster calling from [facility], to let us know that the order sent to them in regards of this pt, has not been scheduled.  They have left messages on pt's VM to call their office back and now currently pt's phone is not working.  Phone number for pt is [phone].  Paige Foster explained that received that fax/ order in beginning of January.  The number to [facility] is [phone].  FYI

## 2020-10-22 ENCOUNTER — Other Ambulatory Visit: Payer: Self-pay | Admitting: Nurse Practitioner

## 2020-10-22 DIAGNOSIS — I10 Essential (primary) hypertension: Secondary | ICD-10-CM

## 2020-10-28 ENCOUNTER — Encounter: Payer: Self-pay | Admitting: Oncology

## 2020-10-28 NOTE — Progress Notes (Signed)
Patient called to inquire about [employer] and collections regarding 02/2019 date of service. Referred patient to Customer Service and advised she may apply for [employer] settlement to determine if she can receive any discount on the bill. She didn't seem to understand but thanked me for my time.  She has my name and number for any additional financial questions or concerns.

## 2020-11-04 ENCOUNTER — Other Ambulatory Visit: Payer: Self-pay

## 2020-11-04 ENCOUNTER — Ambulatory Visit (AMBULATORY_SURGERY_CENTER): Payer: Self-pay | Admitting: *Deleted

## 2020-11-04 VITALS — Ht 66.0 in | Wt 145.0 lb

## 2020-11-04 DIAGNOSIS — Z86010 Personal history of colonic polyps: Secondary | ICD-10-CM

## 2020-11-04 MED ORDER — PEG-KCL-NACL-NASULF-NA ASC-C 100 G PO SOLR: 1.0000 | Freq: Once | ORAL | 0 refills | Status: AC

## 2020-11-04 NOTE — Progress Notes (Addendum)

## 2020-11-13 ENCOUNTER — Encounter: Payer: Self-pay | Admitting: Gastroenterology

## 2020-11-18 ENCOUNTER — Ambulatory Visit (AMBULATORY_SURGERY_CENTER): Payer: BC Managed Care – PPO | Admitting: Gastroenterology

## 2020-11-18 ENCOUNTER — Encounter: Payer: Self-pay | Admitting: Gastroenterology

## 2020-11-18 ENCOUNTER — Other Ambulatory Visit: Payer: Self-pay

## 2020-11-18 VITALS — BP 136/68 | HR 67 | Temp 97.8°F | Resp 14 | Ht 62.0 in | Wt 145.0 lb

## 2020-11-18 DIAGNOSIS — Z86010 Personal history of colonic polyps: Secondary | ICD-10-CM

## 2020-11-18 MED ORDER — SODIUM CHLORIDE 0.9 % IV SOLN: 500.0000 mL | Freq: Once | INTRAVENOUS | Status: DC

## 2020-11-18 NOTE — Progress Notes (Signed)
Report to PACU, RN, vss, BBS= Clear.  

## 2020-11-18 NOTE — Op Note (Signed)
[facility] Patient Name: Paige Foster Procedure Date: 11/18/2020 10:18 AM MRN: [id number] Endoscopist: Braham. Loletha Carrow , MD Age: 55 Referring MD:  Date of Birth: [birth year] Gender: Female Account #: [id number] Procedure:                Colonoscopy Indications:              Surveillance: Personal history of adenomatous                            polyps on last colonoscopy > 5 years ago (53mm TA                            and 14mm SSP in May 2016) Medicines:                Monitored Anesthesia Care Procedure:                Pre-Anesthesia Assessment:                           - Prior to the procedure, a History and Physical                            was performed, and patient medications and                            allergies were reviewed. The patient's tolerance of                            previous anesthesia was also reviewed. The risks                            and benefits of the procedure and the sedation                            options and risks were discussed with the patient.                            All questions were answered, and informed consent                            was obtained. Prior Anticoagulants: The patient has                            taken no previous anticoagulant or antiplatelet                            agents. ASA Grade Assessment: II - A patient with                            mild systemic disease. After reviewing the risks                            and benefits, the patient was deemed in  satisfactory condition to undergo the procedure.                           After obtaining informed consent, the colonoscope                            was passed under direct vision. Throughout the                            procedure, the patient's blood pressure, pulse, and                            oxygen saturations were monitored continuously. The                            Olympus CF-HQ190L (Serial# [id number])  Colonoscope was                            introduced through the anus and advanced to the the                            cecum, identified by appendiceal orifice and                            ileocecal valve. The colonoscopy was somewhat                            difficult due to a redundant colon. The patient                            tolerated the procedure well. The quality of the                            bowel preparation was excellent. The ileocecal                            valve, appendiceal orifice, and rectum were                            photographed. Scope In: 10:34:17 AM Scope Out: 10:50:39 AM Scope Withdrawal Time: 0 hours 11 minutes 41 seconds  Total Procedure Duration: 0 hours 16 minutes 22 seconds  Findings:                 The perianal and digital rectal examinations were                            normal.                           A few small-mouthed diverticula were found in the                            left colon.  There is no endoscopic evidence of polyps in the                            entire colon.                           An area of melanosis was found in the entire colon.                           The exam was otherwise without abnormality on                            direct and retroflexion views. Complications:            No immediate complications. Estimated Blood Loss:     Estimated blood loss: none. Impression:               - Diverticulosis in the left colon.                           - Melanosis in the colon.                           - The examination was otherwise normal on direct                            and retroflexion views.                           - No specimens collected. Recommendation:           - Patient has a contact number available for                            emergencies. The signs and symptoms of potential                            delayed complications were discussed with the                             patient. Return to normal activities tomorrow.                            Written discharge instructions were provided to the                            patient.                           - Resume previous diet.                           - Continue present medications.                           - Repeat colonoscopy in 10 years for screening  purposes. Demitra Danley L. Loletha Carrow, MD 11/18/2020 10:56:00 AM This report has been signed electronically.

## 2020-11-18 NOTE — Progress Notes (Signed)
[id number]-[id number]  Pt's states no medical or surgical changes since previsit or office visit.  

## 2020-11-18 NOTE — Patient Instructions (Signed)
YOU HAD AN ENDOSCOPIC PROCEDURE TODAY AT [facility]:   Refer to the procedure report that was given to you for any specific questions about what was found during the examination.  If the procedure report does not answer your questions, please call your gastroenterologist to clarify.  If you requested that your care partner not be given the details of your procedure findings, then the procedure report has been included in a sealed envelope for you to review at your convenience later.  YOU SHOULD EXPECT: Some feelings of bloating in the abdomen. Passage of more gas than usual.  Walking can help get rid of the air that was put into your GI tract during the procedure and reduce the bloating. If you had a lower endoscopy (such as a colonoscopy or flexible sigmoidoscopy) you may notice spotting of blood in your stool or on the toilet paper. If you underwent a bowel prep for your procedure, you may not have a normal bowel movement for a few days.  Please Note:  You might notice some irritation and congestion in your nose or some drainage.  This is from the oxygen used during your procedure.  There is no need for concern and it should clear up in a day or so.  SYMPTOMS TO REPORT IMMEDIATELY:   Following lower endoscopy (colonoscopy or flexible sigmoidoscopy):  Excessive amounts of blood in the stool  Significant tenderness or worsening of abdominal pains  Swelling of the abdomen that is new, acute  Fever of 100F or higher    For urgent or emergent issues, a gastroenterologist can be reached at any hour by calling [phone]. Do not use MyChart messaging for urgent concerns.    DIET:  We do recommend a small meal at first, but then you may proceed to your regular diet.  Drink plenty of fluids but you should avoid alcoholic beverages for 24 hours.  ACTIVITY:  You should plan to take it easy for the rest of today and you should NOT DRIVE or use heavy machinery until tomorrow  (because of the sedation medicines used during the test).    FOLLOW UP: Our staff will call the number listed on your records 48-72 hours following your procedure to check on you and address any questions or concerns that you may have regarding the information given to you following your procedure. If we do not reach you, we will leave a message.  We will attempt to reach you two times.  During this call, we will ask if you have developed any symptoms of COVID 19. If you develop any symptoms (ie: fever, flu-like symptoms, shortness of breath, cough etc.) before then, please call [phone].  If you test positive for Covid 19 in the 2 weeks post procedure, please call and report this information to [place].    If any biopsies were taken you will be contacted by phone or by letter within the next 1-3 weeks.  Please call us at [phone] if you have not heard about the biopsies in 3 weeks.    SIGNATURES/CONFIDENTIALITY: You and/or your care partner have signed paperwork which will be entered into your electronic medical record.  These signatures attest to the fact that that the information above on your After Visit Summary has been reviewed and is understood.  Full responsibility of the confidentiality of this discharge information lies with you and/or your care-partner.   Resume medications. Information given on Diverticulosis. Follow up with Dr. Loletha Carrow in office when you  are ready for constipation.

## 2020-11-20 ENCOUNTER — Telehealth: Payer: Self-pay

## 2020-11-20 NOTE — Telephone Encounter (Signed)
Called [phone] and left a message we tried to reach pt for a follow up call. maw

## 2020-11-23 NOTE — Progress Notes (Signed)
[facility]  Telephone:([phone] [phone]    ID: [name] DOB: [birth year]  MR#: [id number]  BVQ#:[id number]  Patient Care Team: Flossie Buffy, NP as PCP - General (Internal Medicine) Obbie Lewallen, Virgie Dad, MD as Consulting Physician (Oncology) Rolm Bookbinder, MD as Consulting Physician (General Surgery) Nche, Charlene Brooke, NP as Nurse Practitioner (Internal Medicine) Jalene Mullet, MD as Consulting Physician (Ophthalmology) Larey Dresser, MD as Consulting Physician (Cardiology) Cristine Polio, MD as Consulting Physician (Plastic Surgery) Danis, Kirke Corin, MD as Consulting Physician (Gastroenterology) OTHER MD:    CHIEF COMPLAINT: 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  She has been on anastrozole since December 2020.  She is tolerating this well, and hot flashes and vaginal dryness are not a major issue.    Her most recent bone density testing performed in 11/2015 at [facility] showed a T-score of -1.6.  Her last mammogram at [facility] was 02/20/2019, which was her specimen radiograph.  She does not want to return to [facility] or go to the breast center.  She tells me she was contacted by [facility] but has not yet scheduled the test.   REVIEW OF SYSTEMS: Ahmiya had her colonoscopy and her Pap smear this year and both were fine.  She does not exercise as to how to work but at work she is on her feet all the time.  She is having some persistent discomfort in the left thigh and she is using some drops for that.  Aside from that a detailed review of systems today was stable.   COVID 19 VACCINATION STATUS: Has had both Pfizer shots plus the booster October 2021   HISTORY OF CURRENT ILLNESS: From the original intake note:  AMAN Foster has a prior history of left breast cancer,  dating back to 2004. At that time she underwent a left mastectomy for stage 0 (noninvasive) breast cancer, with transverse rectus abdominis (TRAM) flap construction under Paige Foster. She also underwent a right breast reduction. She took tamoxifen for three years.  More recently she underwent bilateral diagnostic mammography with tomography and left breast ultrasonography at [facility] on 01/03/2018 showing: Breast Density Category B. There is an oval fat containing lesion in the left breast upper outer quadrant posterior depth. No other significant masses, calcifications, or other findings are seen in either breast. Sonographically, there is a 1.5 cm lesion in the left breast upper outer quadrant posterior depth. This lesion is of mixed echogenicity. This correlates as palpated and with mammography findings. Follow up was recommended.  Close follow-up was suggested.  She then presented with a non-tender mass in the left reconstructed breast on 08/30/2018. On physical exam, there is a hard palpable lump measuring 2.0 cm in the upper outer left reconstructed breast 10 cm from the expected location of a nipple. Sonography over this area demonstrates a 1.8 cm x 1.7 cm x 1.4 cm mass in the left breast at 2 o'clock posterior depth 10 cm from the nipple. This mass is of mixed echogenicity. This abnormality is increased in size and correlates as palpated and with prior mammography findings. Color flow imaging demonstrates that there is vascularity present. Elastography imaging assessment is intermediate. No significant abnormalities were seen sonographically in the left axilla.    Accordingly on 08/30/2018 she proceeded to biopsy of the left breast mass in question. The pathology  from this procedure showed ([id number]): invasive ductal carcinoma, grade III. Prognostic indicators significant for: estrogen receptor, 100% positive with strong staining intensity and progesterone receptor, 0% negative. Proliferation marker  Ki67 at 15%. HER2 positive (3+) by immunohistochemistry.  The patient's subsequent history is as detailed below.   PAST MEDICAL HISTORY: Past Medical History:  Diagnosis Date  . Anemia   . History of blood transfusion 2004  . History of colon polyps   . Hypertension   . 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 Recurrent breast cancer, left [facility]) oncologist-- dr Paige Foster    dx 2004, noninvasive Stage 0 ----s/p left mastectomy w/ tram flap construction (and right breast reduction), taken Tamoxifen for 3 yrs;   08-30-2018 recurrent left cancer , Grade III,  cT1c,  ER positive, PR negative, HER-2 positive, invasive ductal carcinoma-- neoadjuvant chemo to start 09-26-2018  . Renal artery stenosis (HCC)    mild right external renal artery stenosis per duplex in epic 08-09-2013  . Wears glasses     PAST SURGICAL HISTORY: Past Surgical History:  Procedure Laterality Date  . BREAST LUMPECTOMY WITH RADIOACTIVE SEED LOCALIZATION Left 02/20/2019   Procedure: LEFT BREAST LUMPECTOMY WITH RADIOACTIVE SEED LOCALIZATION;  Surgeon: Rolm Bookbinder, MD;  Location: [facility];  Service: General;  Laterality: Left;  . BREAST SURGERY Left    Transflap  . COLONOSCOPY    . COLONOSCOPY    . MASTECTOMY Left 2004   w/  TRAM flap construction and right breast augmentation with abdominoplasy  . PARS PLANA VITRECTOMY Left 12/18/2018   Procedure: PARS PLANA VITRECTOMY WITH 25 GAUGE, ENDOLASER;  Surgeon: Jalene Mullet, MD;  Location: [facility];  Service: Ophthalmology;  Laterality: Left;  . PORTACATH PLACEMENT N/A 09/25/2018   Procedure: INSERTION PORT-A-CATH WITH ULTRASOUND;  Surgeon: Rolm Bookbinder, MD;  Location: [facility];  Service: General;  Laterality: N/A;  . TUBAL LIGATION Bilateral yrs ago    FAMILY HISTORY: Family History  Problem Relation Age of Onset  . Diabetes Mother   . Hypertension Mother   . Kidney disease Father   . Colon cancer Neg Hx   . Colon polyps Neg Hx   .  Gallbladder disease Neg Hx   . Heart disease Neg Hx   . Esophageal cancer Neg Hx   . Stomach cancer Neg Hx   . Rectal cancer Neg Hx   Khandi's father died from unknown causes in his early 60's. Patients' mother died from diabetes complications at age 63. The patient has 1 sister. Patient denies anyone in her family having breast, ovarian, prostate, or pancreatic cancer.    GYNECOLOGIC HISTORY:  Patient's last menstrual period was 06/08/2007. 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 Her husband, Elwin Mocha, works at [employer]. Javaya has two children, Vonna Kotyk and Shanon Brow. Vonna Kotyk lives with her, is 59, and it attending [employer] for a computer based degree. Shanon Brow lives with her, is 32, and recently graduated from [employer] with a degree in Careers information officer.  He works for [employer]. Zahraa has no grandchildren. She attends the [employer]: In the absence of any documents to the contrary her husband, Elwin Mocha, is automatically her healthcare power of attorney     HEALTH MAINTENANCE: Social History   Tobacco Use  . Smoking status:  Never Smoker  . Smokeless tobacco: Never Used  Vaping Use  . Vaping Use: Never used  Substance Use Topics  . Alcohol use: Not Currently    Alcohol/week: 0.0 standard drinks    Comment: Occassionally  . Drug use: No    Colonoscopy: April 2022, danis  PAP: January 2022, Nche  Bone density:  2017; -1.6, osteopenic   No Known Allergies  Current Outpatient Medications  Medication Sig Dispense Refill  . amLODipine (NORVASC) 10 MG tablet Take 1 tablet (10 mg total) by mouth daily. 90 tablet 3  . anastrozole (ARIMIDEX) 1 MG tablet Take 1 tablet (1 mg total) by mouth daily. 90 tablet 4  . losartan (COZAAR) 50 MG tablet Take 1 tablet (50 mg total) by mouth daily. 90  tablet 3  . prednisoLONE acetate (PRED FORTE) 1 % ophthalmic suspension Place 1 drop into the left eye 4 (four) times daily.     No current facility-administered medications for this visit.     OBJECTIVE: African-American woman who appears stated age  55:   11/24/20 1131  BP: 111/68  Pulse: (!) 53  Resp: 18  Temp: 98.1 F (36.7 C)  SpO2: 100%     Body mass index is 27.18 kg/m.   Wt Readings from Last 3 Encounters:  11/24/20 148 lb 9.6 oz (67.4 kg)  11/18/20 145 lb (65.8 kg)  11/04/20 145 lb (65.8 kg)   Sclerae unicteric, EOMs intact, no scleral erythema noted today Wearing a mask No cervical or supraclavicular adenopathy Lungs no rales or rhonchi Heart regular rate and rhythm Abd soft, nontender, positive bowel sounds MSK no focal spinal tenderness, no upper extremity lymphedema Neuro: nonfocal, well oriented, appropriate affect Breasts: The right breast is status post reduction mammoplasty.  The left breast is status post mastectomy and reconstruction.  There is no evidence of local recurrence.  Both axillae are benign.   LAB RESULTS:  CMP     Component Value Date/Time   NA 141 11/24/2020 1100   NA 140 10/12/2017 0950   K 3.5 11/24/2020 1100   CL 105 11/24/2020 1100   CO2 29 11/24/2020 1100   GLUCOSE 87 11/24/2020 1100   BUN 8 11/24/2020 1100   BUN 10 10/12/2017 0950   CREATININE 0.64 11/24/2020 1100   CREATININE 0.78 09/07/2018 1455   CALCIUM 9.3 11/24/2020 1100   PROT 6.8 11/24/2020 1100   PROT 7.5 10/12/2017 0950   ALBUMIN 3.7 11/24/2020 1100   ALBUMIN 4.3 10/12/2017 0950   AST 16 11/24/2020 1100   AST 14 (L) 09/07/2018 1455   ALT 11 11/24/2020 1100   ALT 13 09/07/2018 1455   ALKPHOS 84 11/24/2020 1100   BILITOT <0.2 (L) 11/24/2020 1100   BILITOT 0.2 (L) 09/07/2018 1455   GFRNONAA >60 11/24/2020 1100   GFRNONAA >60 09/07/2018 1455   GFRAA >60 12/06/2019 1441   GFRAA >60 09/07/2018 1455   Lab Results  Component Value Date   WBC 4.1  11/24/2020   NEUTROABS 2.2 11/24/2020   HGB 11.6 (L) 11/24/2020   HCT 34.5 (L) 11/24/2020   MCV 94.0 11/24/2020   PLT 218 11/24/2020    Lab Results  Component Value Date   LABCA2 <4 08/30/2007    No components found for: [id number]  No results for input(s): INR in the last 168 hours.  Lab Results  Component Value Date   LABCA2 <4 08/30/2007    No results found for: SWN462  No results found for: VOJ500  No results found for:  ZJI967  No results found for: CA2729  No components found for: HGQUANT  No results found for: CEA1 / No results found for: CEA1  No results found for: AFPTUMOR  No results found for: CHROMOGRNA  No results found for: TOTALPROTELP, ALBUMINELP, A1GS, A2GS, BETS, BETA2SER, GAMS, MSPIKE, SPEI (this displays SPEP labs)  No results found for: KPAFRELGTCHN, LAMBDASER, KAPLAMBRATIO (kappa/lambda light chains)  No results found for: HGBA, HGBA2QUANT, HGBFQUANT, HGBSQUAN (Hemoglobinopathy evaluation)   Lab Results  Component Value Date   LDH 169 08/30/2007    Lab Results  Component Value Date   IRON 109 08/13/2020   TIBC 400 08/13/2020   IRONPCTSAT 27 08/13/2020   (Iron and TIBC)  Lab Results  Component Value Date   FERRITIN 122 08/13/2020    Urinalysis    Component Value Date/Time   LABSPEC 1.015 04/09/2008 1548   PHURINE 7.0 04/09/2008 1548   HGBUR large 04/09/2008 1548   BILIRUBINUR negative 04/09/2008 1548   UROBILINOGEN 0.2 04/09/2008 1548   NITRITE negative 04/09/2008 1548     STUDIES:  No results found.  ELIGIBLE FOR AVAILABLE RESEARCH PROTOCOL: No   ASSESSMENT: 55 y.o. [place], [place] woman  (1) history of left-sided ductal carcinoma in situ 2004  (a) s/p left mastectomy with TRAM reconstruction  (b) status post tamoxifen x3 years  (2) left breast upper outer quadrant biopsy 08/30/2018 shows a clinical T1c N0 invasive ductal carcinoma, grade 3, estrogen receptor strongly positive, progesterone receptor negative,  with HER-2 amplification, and and MIB-1 of 15%.   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    (b) chemotherapy discontinued after 4 cycles because of intercurrent eye surgery  (c) continued trastuzumab and pertuzumab to complete a year (last dose 09/06/2019)  (d) echocardiogram on 01/23/2019 that shows well preserved EF of 60-65%  (e) echocardiogram on 04/23/2019 shows EF of 60-65%  (f) echocardiogram 08/02/2019 shows an ejection fraction in the 60-65% range  (4) left lumpectomy 02/20/2019 showed a residual  ypT1c NX invasive ductal carcinoma, grade 2 with negative margins.    (5) adjuvant radiation: Radiation Treatment Dates: 04/12/2019 through 05/28/2019 Site Technique Total Dose (Gy) Dose per Fx (Gy) Completed Fx Beam Energies  Breast: CW_Lt 3D 50.4/50.4 1.8 28/28 6X, 10X  Breast: CW_Lt_SCV_PAB 3D 50.4/50.4 1.8 28/28 6X, 10X  Breast: CW_Lt_Bst Electron 10/10 2 5/5 6X, 10X   (6) anastrozole started 06/26/2019  (a) D[facility] scan at [facility] 12/24/2015 found a T score of -1.6  (7) genetics testing 02/03/2019 through the Common Hereditary Cancers Panel offered by [facility] found no deleterious mutations in APC, ATM, AXIN2, BARD1, BMPR1A, BRCA1, BRCA2, BRIP1, CDH1, CDKN2A (p14ARF), CDKN2A (p16INK4a), CKD4, CHEK2, CTNNA1, DICER1, EPCAM (Deletion/duplication testing only), GREM1 (promoter region deletion/duplication testing only), KIT, MEN1, MLH1, MSH2, MSH3, MSH6, MUTYH, NBN, NF1, NHTL1, PALB2, PDGFRA, PMS2, POLD1, POLE, PTEN, RAD50, RAD51C, RAD51D, RNF43, SDHB, SDHC, SDHD, SMAD4, SMARCA4. STK11, TP53, TSC1, TSC2, and VHL.  The following genes were evaluated for sequence changes only:  SDHA and HOXB13 c.251G>A variant only.   PLAN: Takera will soon be 2 years out from definitive surgery for her breast cancer, with no evidence of disease recurrence.  This is very favorable.  She is behind on her mammography.  The order  is in for her to have that done at [facility].  I encouraged her to get that done within the next 44months.  She is otherwise up-to-date on her health maintenance.  I do not think the left red eye problem that she gets occasionally is related to the anastrozole.  She is using some eyedrops for this and I encouraged her to continue.  She will follow-up with ophthalmology as needed.  Otherwise she will see me in the fall and from that point we will start seeing her on a once a year basis  She knows to call for any other issue that may develop before then  Total encounter time 30 minutes.Chauncey Cruel, MD Medical Oncology and Hematology [facility] [place], [place] [address] Tel. [phone]    Fax. [phone]   I, Wilburn Mylar, am acting as scribe for Dr. Virgie Dad. Fatin Bachicha.  I, Lurline Del MD, have reviewed the above documentation for accuracy and completeness, and I agree with the above.   *Total Encounter Time as defined by the Centers for Medicare and Medicaid Services includes, in addition to the face-to-face time of a patient visit (documented in the note above) non-face-to-face time: obtaining and reviewing outside history, ordering and reviewing medications, tests or procedures, care coordination (communications with other health care professionals or caregivers) and documentation in the medical record.

## 2020-11-24 ENCOUNTER — Inpatient Hospital Stay: Payer: BC Managed Care – PPO

## 2020-11-24 ENCOUNTER — Inpatient Hospital Stay: Payer: BC Managed Care – PPO | Attending: Oncology | Admitting: Oncology

## 2020-11-24 ENCOUNTER — Other Ambulatory Visit: Payer: Self-pay

## 2020-11-24 VITALS — BP 111/68 | HR 53 | Temp 98.1°F | Resp 18 | Ht 62.0 in | Wt 148.6 lb

## 2020-11-24 DIAGNOSIS — Z9012 Acquired absence of left breast and nipple: Secondary | ICD-10-CM | POA: Diagnosis not present

## 2020-11-24 DIAGNOSIS — Z9221 Personal history of antineoplastic chemotherapy: Secondary | ICD-10-CM | POA: Diagnosis not present

## 2020-11-24 DIAGNOSIS — Z79811 Long term (current) use of aromatase inhibitors: Secondary | ICD-10-CM | POA: Diagnosis not present

## 2020-11-24 DIAGNOSIS — C50412 Malignant neoplasm of upper-outer quadrant of left female breast: Secondary | ICD-10-CM

## 2020-11-24 DIAGNOSIS — C50912 Malignant neoplasm of unspecified site of left female breast: Secondary | ICD-10-CM

## 2020-11-24 DIAGNOSIS — Z170 Estrogen receptor positive status [ER+]: Secondary | ICD-10-CM | POA: Insufficient documentation

## 2020-11-24 DIAGNOSIS — Z923 Personal history of irradiation: Secondary | ICD-10-CM | POA: Diagnosis not present

## 2020-11-24 LAB — COMPREHENSIVE METABOLIC PANEL
ALT: 11 U/L (ref 0–44)
AST: 16 U/L (ref 15–41)
Albumin: 3.7 g/dL (ref 3.5–5.0)
Alkaline Phosphatase: 84 U/L (ref 38–126)
Anion gap: 7 (ref 5–15)
BUN: 8 mg/dL (ref 6–20)
CO2: 29 mmol/L (ref 22–32)
Calcium: 9.3 mg/dL (ref 8.9–10.3)
Chloride: 105 mmol/L (ref 98–111)
Creatinine, Ser: 0.64 mg/dL (ref 0.44–1.00)
GFR, Estimated: >60 mL/min (ref >60)
Glucose, Bld: 87 mg/dL (ref 70–99)
Potassium: 3.5 mmol/L (ref 3.5–5.1)
Sodium: 141 mmol/L (ref 135–145)
Total Bilirubin: <0.2 mg/dL — ABNORMAL LOW (ref 0.3–1.2)
Total Protein: 6.8 g/dL (ref 6.5–8.1)

## 2020-11-24 LAB — CBC WITH DIFFERENTIAL/PLATELET
Abs Immature Granulocytes: 0.01 10*3/uL (ref 0.00–0.07)
Basophils Absolute: 0 10*3/uL (ref 0.0–0.1)
Basophils Relative: 1 %
Eosinophils Absolute: 0 10*3/uL (ref 0.0–0.5)
Eosinophils Relative: 1 %
HCT: 34.5 % — ABNORMAL LOW (ref 36.0–46.0)
Hemoglobin: 11.6 g/dL — ABNORMAL LOW (ref 12.0–15.0)
Immature Granulocytes: 0 %
Lymphocytes Relative: 33 %
Lymphs Abs: 1.4 10*3/uL (ref 0.7–4.0)
MCH: 31.6 pg (ref 26.0–34.0)
MCHC: 33.6 g/dL (ref 30.0–36.0)
MCV: 94 fL (ref 80.0–100.0)
Monocytes Absolute: 0.4 10*3/uL (ref 0.1–1.0)
Monocytes Relative: 11 %
Neutro Abs: 2.2 10*3/uL (ref 1.7–7.7)
Neutrophils Relative %: 54 %
Platelets: 218 10*3/uL (ref 150–400)
RBC: 3.67 MIL/uL — ABNORMAL LOW (ref 3.87–5.11)
RDW: 12.3 % (ref 11.5–15.5)
WBC: 4.1 10*3/uL (ref 4.0–10.5)
nRBC: 0 % (ref 0.0–0.2)

## 2020-11-26 ENCOUNTER — Telehealth: Payer: Self-pay | Admitting: Oncology

## 2020-11-26 NOTE — Telephone Encounter (Signed)
Scheduled per 5/2 los. Called pt and left a msg  

## 2021-03-16 ENCOUNTER — Other Ambulatory Visit: Payer: Self-pay | Admitting: Nurse Practitioner

## 2021-03-16 DIAGNOSIS — I10 Essential (primary) hypertension: Secondary | ICD-10-CM

## 2021-03-18 NOTE — Telephone Encounter (Signed)
Chart supports rx refill Last ov: 08/13/2020 Last refill: 12/19/2020

## 2021-06-01 NOTE — Progress Notes (Addendum)
[place]  [phone] [phone]    ID: Paige Foster DOB: [birth year]  MR#: [id number]  EBX#:[id number]  Patient Care Team: Flossie Buffy, NP as PCP - General (Internal Medicine) Toy Eisemann, Virgie Dad, MD as Consulting Physician (Oncology) Rolm Bookbinder, MD as Consulting Physician (General Surgery) Foster, Paige Brooke, NP as Nurse Practitioner (Internal Medicine) Paige Mullet, MD as Consulting Physician (Ophthalmology) Larey Dresser, MD as Consulting Physician (Cardiology) Cristine Polio, MD as Consulting Physician (Plastic Surgery) Danis, Kirke Corin, MD as Consulting Physician (Gastroenterology) OTHER MD:    CHIEF COMPLAINT: 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  She has been on anastrozole since December 2020.  She is not having any side effects from the medication that she is aware of and particularly hot flashes are not a concern.   Her most recent bone density testing performed in 11/2015 at [facility] showed a T-score of -1.6, consistent with mild osteopenia..  Her last mammogram at [facility] was 02/20/2019, which was her specimen radiograph.  She does not want to return to [facility] or go to the breast center.  She tells me she has an appointment at [facility] today at 2 PM for mammography.  REVIEW OF SYSTEMS: Paige Foster is active at work but does not otherwise exercise.  She "needs to do more" she says.  She tells me she has been diagnosed with glaucoma.  She still has her port in place and is not sure she wants to remove it "just in case the cancer comes back".  A detailed review of systems today was otherwise stable.   COVID 19 VACCINATION STATUS: Has had both Pfizer shots plus 1 booster October 2021   HISTORY OF CURRENT ILLNESS: From the original intake note:  Paige Foster has a prior history of left breast cancer, dating back to 2004. At that time she underwent a left mastectomy for stage 0 (noninvasive) breast cancer, with transverse rectus abdominis (TRAM) flap construction under Dr. Towanda Malkin. She also underwent a right breast reduction. She took tamoxifen for three years.  More recently she underwent bilateral diagnostic mammography with tomography and left breast ultrasonography at [facility] on 01/03/2018 showing: Breast Density Category B. There is an oval fat containing lesion in the left breast upper outer quadrant posterior depth. No other significant masses, calcifications, or other findings are seen in either breast. Sonographically, there is a 1.5 cm lesion in the left breast upper outer quadrant posterior depth. This lesion is of mixed echogenicity. This correlates as palpated and with mammography findings. Follow up was recommended.  Close follow-up was suggested.  She then presented with a non-tender mass in the left reconstructed breast on 08/30/2018. On physical exam, there is a hard palpable lump measuring 2.0 cm in the upper outer left reconstructed breast 10 cm from the expected location of a nipple. Sonography over this area demonstrates a 1.8 cm x 1.7 cm x 1.4 cm mass in the left breast at 2 o'clock posterior depth 10 cm from the nipple. This mass is of mixed echogenicity. This abnormality is increased in size and correlates as palpated and with prior mammography findings. Color flow imaging demonstrates that there is vascularity present. Elastography imaging assessment is intermediate. No significant abnormalities were seen sonographically in the left axilla.    Accordingly on 08/30/2018 she proceeded to biopsy of the left breast mass  in question. The pathology from this procedure showed ([id number]): invasive ductal carcinoma, grade III. Prognostic indicators significant for: estrogen receptor, 100% positive with strong staining intensity and  progesterone receptor, 0% negative. Proliferation marker Ki67 at 15%. HER2 positive (3+) by immunohistochemistry.  The patient's subsequent history is as detailed below.   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    Recurrent breast cancer, left [facility]) oncologist-- dr Jana Hakim    dx 2004, noninvasive Stage 0 ----s/p left mastectomy w/ tram flap construction (and right breast reduction), taken Tamoxifen for 3 yrs;   08-30-2018 recurrent left cancer , Grade III,  cT1c,  ER positive, PR negative, HER-2 positive, invasive ductal carcinoma-- neoadjuvant chemo to start 09-26-2018   Renal artery stenosis (HCC)    mild right external renal artery stenosis per duplex in epic 08-09-2013   Wears glasses     PAST SURGICAL HISTORY: Past Surgical History:  Procedure Laterality Date   BREAST LUMPECTOMY WITH RADIOACTIVE SEED LOCALIZATION Left 02/20/2019   Procedure: LEFT BREAST LUMPECTOMY WITH RADIOACTIVE SEED LOCALIZATION;  Surgeon: Rolm Bookbinder, MD;  Location: [place];  Service: General;  Laterality: Left;   BREAST SURGERY Left    Transflap   COLONOSCOPY     COLONOSCOPY     MASTECTOMY Left 2004   w/  TRAM flap construction and right breast augmentation with abdominoplasy   PARS PLANA VITRECTOMY Left 12/18/2018   Procedure: PARS PLANA VITRECTOMY WITH 25 GAUGE, ENDOLASER;  Surgeon: Paige Mullet, MD;  Location: [place];  Service: Ophthalmology;  Laterality: Left;   PORTACATH PLACEMENT N/A 09/25/2018   Procedure: INSERTION PORT-A-CATH WITH ULTRASOUND;  Surgeon: Rolm Bookbinder, MD;  Location[facility];  Service: General;  Laterality: N/A;   TUBAL LIGATION Bilateral yrs ago    FAMILY HISTORY: Family History  Problem Relation Age of Onset   Diabetes Mother    Hypertension Mother    Kidney disease Father    Colon cancer Neg Hx     Colon polyps Neg Hx    Gallbladder disease Neg Hx    Heart disease Neg Hx    Esophageal cancer Neg Hx    Stomach cancer Neg Hx    Rectal cancer Neg Hx   Kaytlen's father died from unknown causes in his early 90's. Patients' mother died from diabetes complications at age 56. The patient has 1 sister. Patient denies anyone in her family having breast, ovarian, prostate, or pancreatic cancer.    GYNECOLOGIC HISTORY:  Patient's last menstrual period was 06/08/2007. 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 Her husband, Elwin Mocha, works at [employer]. Paige Foster has two children, Paige Foster and Paige Foster. Paige Foster lives with her, is 59, and it attending [employer] for a computer based degree. Paige Foster lives with her, is 44, and recently graduated from [employer] with a degree in Careers information officer.  He works for [employer]. Paige Foster has no grandchildren. She attends the [employer]: In the absence of any documents to the contrary her husband, Elwin Mocha, is automatically her healthcare power of attorney     HEALTH MAINTENANCE: Social History   Tobacco Use  Smoking status: Never   Smokeless tobacco: Never  Vaping Use   Vaping Use: Never used  Substance Use Topics   Alcohol use: Not Currently    Alcohol/week: 0.0 standard drinks    Comment: Occassionally   Drug use: No    Colonoscopy: April 2022, danis  PAP: January 2022, Foster  Bone density:  2017; -1.6, osteopenic   No Known Allergies  Current Outpatient Medications  Medication Sig Dispense Refill   amLODipine (NORVASC) 10 MG tablet TAKE 1 TABLET(10 MG) BY MOUTH DAILY 90 tablet 1   anastrozole (ARIMIDEX) 1 MG tablet Take 1 tablet (1 mg total) by mouth daily. 90 tablet 4   losartan (COZAAR) 50 MG tablet Take 1 tablet (50 mg total) by mouth daily. 90 tablet 3    prednisoLONE acetate (PRED FORTE) 1 % ophthalmic suspension Place 1 drop into the left eye 4 (four) times daily.     No current facility-administered medications for this visit.     OBJECTIVE: African-American woman who appears stated age  55:   06/02/21 1054  BP: 121/69  Pulse: (!) 57  Resp: 16  Temp: 97.7 F (36.5 C)  SpO2: 100%     Body mass index is 27.65 kg/m.   Wt Readings from Last 3 Encounters:  06/02/21 151 lb 3.2 oz (68.6 kg)  11/24/20 148 lb 9.6 oz (67.4 kg)  11/18/20 145 lb (65.8 kg)   Sclerae unicteric, EOMs intact Wearing a mask No cervical or supraclavicular adenopathy Lungs no rales or rhonchi Heart regular rate and rhythm Abd soft, nontender, positive bowel sounds MSK no focal spinal tenderness, no upper extremity lymphedema Neuro: nonfocal, well oriented, appropriate affect Breasts: The right breast is status post reduction mammoplasty.  The left breast is status post mastectomy and reconstruction.  There is no evidence of local recurrence.  Both axillae are benign.   LAB RESULTS:  CMP     Component Value Date/Time   NA 141 11/24/2020 1100   NA 140 10/12/2017 0950   K 3.5 11/24/2020 1100   CL 105 11/24/2020 1100   CO2 29 11/24/2020 1100   GLUCOSE 87 11/24/2020 1100   BUN 8 11/24/2020 1100   BUN 10 10/12/2017 0950   CREATININE 0.64 11/24/2020 1100   CREATININE 0.78 09/07/2018 1455   CALCIUM 9.3 11/24/2020 1100   PROT 6.8 11/24/2020 1100   PROT 7.5 10/12/2017 0950   ALBUMIN 3.7 11/24/2020 1100   ALBUMIN 4.3 10/12/2017 0950   AST 16 11/24/2020 1100   AST 14 (L) 09/07/2018 1455   ALT 11 11/24/2020 1100   ALT 13 09/07/2018 1455   ALKPHOS 84 11/24/2020 1100   BILITOT <0.2 (L) 11/24/2020 1100   BILITOT 0.2 (L) 09/07/2018 1455   GFRNONAA >60 11/24/2020 1100   GFRNONAA >60 09/07/2018 1455   GFRAA >60 12/06/2019 1441   GFRAA >60 09/07/2018 1455   Lab Results  Component Value Date   WBC 4.6 06/02/2021   NEUTROABS 2.6 06/02/2021   HGB  12.9 06/02/2021   HCT 37.6 06/02/2021   MCV 92.4 06/02/2021   PLT 228 06/02/2021    Lab Results  Component Value Date   LABCA2 <4 08/30/2007    No components found for: [id number]  No results for input(s): INR in the last 168 hours.  Lab Results  Component Value Date   LABCA2 <4 08/30/2007    No results found for: YOF188  No results found for: QLR373  No results found for: GKK159  No results found for: EL0761  No components found for: HGQUANT  No results found for: CEA1 / No results found for: CEA1  No results found for: AFPTUMOR  No results found for: CHROMOGRNA  No results found for: TOTALPROTELP, ALBUMINELP, A1GS, A2GS, BETS, BETA2SER, GAMS, MSPIKE, SPEI (this displays SPEP labs)  No results found for: KPAFRELGTCHN, LAMBDASER, KAPLAMBRATIO (kappa/lambda light chains)  No results found for: HGBA, HGBA2QUANT, HGBFQUANT, HGBSQUAN (Hemoglobinopathy evaluation)   Lab Results  Component Value Date   LDH 169 08/30/2007    Lab Results  Component Value Date   IRON 109 08/13/2020   TIBC 400 08/13/2020   IRONPCTSAT 27 08/13/2020   (Iron and TIBC)  Lab Results  Component Value Date   FERRITIN 122 08/13/2020    Urinalysis    Component Value Date/Time   LABSPEC 1.015 04/09/2008 1548   PHURINE 7.0 04/09/2008 1548   HGBUR large 04/09/2008 1548   BILIRUBINUR negative 04/09/2008 1548   UROBILINOGEN 0.2 04/09/2008 1548   NITRITE negative 04/09/2008 1548    STUDIES:  No results found.   ELIGIBLE FOR AVAILABLE RESEARCH PROTOCOL: No  ASSESSMENT: 55 y.o. [place], [place] woman  (1) history of left-sided ductal carcinoma in situ 2004  (a) s/p left mastectomy with TRAM reconstruction  (b) status post tamoxifen x3 years  (2) left breast upper outer quadrant biopsy 08/30/2018 shows a clinical T1c N0 invasive ductal carcinoma, grade 3, estrogen receptor strongly positive, progesterone receptor negative, with HER-2 amplification, and and MIB-1 of 15%.   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    (b) chemotherapy discontinued after 4 cycles because of intercurrent eye surgery  (c) continued trastuzumab and pertuzumab to complete a year (last dose 09/06/2019)  (d) echocardiogram on 01/23/2019 that shows well preserved EF of 60-65%  (e) echocardiogram on 04/23/2019 shows EF of 60-65%  (f) echocardiogram 08/02/2019 shows an ejection fraction in the 60-65% range  (4) left lumpectomy 02/20/2019 showed a residual  ypT1c NX invasive ductal carcinoma, grade 2 with negative margins.    (5) adjuvant radiation: Radiation Treatment Dates: 04/12/2019 through 05/28/2019 Site Technique Total Dose (Gy) Dose per Fx (Gy) Completed Fx Beam Energies  Breast: CW_Lt 3D 50.4/50.4 1.8 28/28 6X, 10X  Breast: CW_Lt_SCV_PAB 3D 50.4/50.4 1.8 28/28 6X, 10X  Breast: CW_Lt_Bst Electron 10/10 2 5/5 6X, 10X   (6) anastrozole started 06/26/2019  (a) DEXA scan at [facility] 12/24/2015 found a T score of -1.6  (7) genetics testing 02/03/2019 through the Common Hereditary Cancers Panel offered by [facility] found no deleterious mutations in APC, ATM, AXIN2, BARD1, BMPR1A, BRCA1, BRCA2, BRIP1, CDH1, CDKN2A (p14ARF), CDKN2A (p16INK4a), CKD4, CHEK2, CTNNA1, DICER1, EPCAM (Deletion/duplication testing only), GREM1 (promoter region deletion/duplication testing only), KIT, MEN1, MLH1, MSH2, MSH3, MSH6, MUTYH, NBN, NF1, NHTL1, PALB2, PDGFRA, PMS2, POLD1, POLE, PTEN, RAD50, RAD51C, RAD51D, RNF43, SDHB, SDHC, SDHD, SMAD4, SMARCA4. STK11, TP53, TSC1, TSC2, and VHL.  The following genes were evaluated for sequence changes only: SDHA and HOXB13 c.251G>A variant only.   PLAN: [place]  is now a little over 2 years out from definitive surgery for her breast cancer with no evidence of disease recurrence.  This is very favorable.  She is tolerating anastrozole well and the plan will be to continue that a total of  5 years.  I reassured her that anastrozole does not cause glaucoma.  She is thinking of tamoxifen, which can have some issues associated with it.  She is behind on mammography.  She tells me she has a mammogram scheduled today at [facility].  We will follow-up on those results.  Have encouraged her to walk a little bit, perhaps 2025 minutes at least 3 days a week, to increase her exercise.  I also encouraged her to have the port removed but at this point she wants to keep it "a little longer" because she is afraid the cancer might come back and she might need it again.  She understands the procedure of removing the port is a minor one and also that the port may cause problems with infection or clotting.  She will let me know if she wants me to contact her surgeon to have the port removed.  Otherwise she will return to see [place] in 1 year.  She knows to call for any other issue that may develop before the next visit  Total encounter time 25 minutes.Chauncey Cruel, MD Medical Oncology and Hematology [facility] [place], [place] [address] Tel. [phone]    Fax. [phone]   I, Wilburn Mylar, am acting as scribe for Dr. Virgie Dad. Yu Cragun.  I, Lurline Del MD, have reviewed the above documentation for accuracy and completeness, and I agree with the above.   *Total Encounter Time as defined by the Centers for Medicare and Medicaid Services includes, in addition to the face-to-face time of a patient visit (documented in the note above) non-face-to-face time: obtaining and reviewing outside history, ordering and reviewing medications, tests or procedures, care coordination (communications with other health care professionals or  caregivers) and documentation in the medical record.

## 2021-06-02 ENCOUNTER — Inpatient Hospital Stay (HOSPITAL_BASED_OUTPATIENT_CLINIC_OR_DEPARTMENT_OTHER): Payer: BC Managed Care – PPO | Admitting: Oncology

## 2021-06-02 ENCOUNTER — Other Ambulatory Visit: Payer: Self-pay

## 2021-06-02 ENCOUNTER — Inpatient Hospital Stay: Payer: BC Managed Care – PPO | Attending: Oncology

## 2021-06-02 VITALS — BP 121/69 | HR 57 | Temp 97.7°F | Resp 16 | Ht 62.0 in | Wt 151.2 lb

## 2021-06-02 DIAGNOSIS — Z9012 Acquired absence of left breast and nipple: Secondary | ICD-10-CM | POA: Diagnosis not present

## 2021-06-02 DIAGNOSIS — C50912 Malignant neoplasm of unspecified site of left female breast: Secondary | ICD-10-CM

## 2021-06-02 DIAGNOSIS — C50412 Malignant neoplasm of upper-outer quadrant of left female breast: Secondary | ICD-10-CM | POA: Insufficient documentation

## 2021-06-02 DIAGNOSIS — Z923 Personal history of irradiation: Secondary | ICD-10-CM | POA: Insufficient documentation

## 2021-06-02 DIAGNOSIS — Z79811 Long term (current) use of aromatase inhibitors: Secondary | ICD-10-CM | POA: Insufficient documentation

## 2021-06-02 DIAGNOSIS — Z170 Estrogen receptor positive status [ER+]: Secondary | ICD-10-CM | POA: Insufficient documentation

## 2021-06-02 LAB — COMPREHENSIVE METABOLIC PANEL
ALT: 13 U/L (ref 0–44)
AST: 17 U/L (ref 15–41)
Albumin: 4.1 g/dL (ref 3.5–5.0)
Alkaline Phosphatase: 100 U/L (ref 38–126)
Anion gap: 8 (ref 5–15)
BUN: 12 mg/dL (ref 6–20)
CO2: 26 mmol/L (ref 22–32)
Calcium: 9.9 mg/dL (ref 8.9–10.3)
Chloride: 103 mmol/L (ref 98–111)
Creatinine, Ser: 0.72 mg/dL (ref 0.44–1.00)
GFR, Estimated: >60 mL/min (ref >60)
Glucose, Bld: 100 mg/dL — ABNORMAL HIGH (ref 70–99)
Potassium: 3.8 mmol/L (ref 3.5–5.1)
Sodium: 137 mmol/L (ref 135–145)
Total Bilirubin: 0.3 mg/dL (ref 0.3–1.2)
Total Protein: 7.7 g/dL (ref 6.5–8.1)

## 2021-06-02 LAB — CBC WITH DIFFERENTIAL/PLATELET
Abs Immature Granulocytes: 0.01 10*3/uL (ref 0.00–0.07)
Basophils Absolute: 0 10*3/uL (ref 0.0–0.1)
Basophils Relative: 0 %
Eosinophils Absolute: 0 10*3/uL (ref 0.0–0.5)
Eosinophils Relative: 1 %
HCT: 37.6 % (ref 36.0–46.0)
Hemoglobin: 12.9 g/dL (ref 12.0–15.0)
Immature Granulocytes: 0 %
Lymphocytes Relative: 32 %
Lymphs Abs: 1.4 10*3/uL (ref 0.7–4.0)
MCH: 31.7 pg (ref 26.0–34.0)
MCHC: 34.3 g/dL (ref 30.0–36.0)
MCV: 92.4 fL (ref 80.0–100.0)
Monocytes Absolute: 0.5 10*3/uL (ref 0.1–1.0)
Monocytes Relative: 10 %
Neutro Abs: 2.6 10*3/uL (ref 1.7–7.7)
Neutrophils Relative %: 57 %
Platelets: 228 10*3/uL (ref 150–400)
RBC: 4.07 MIL/uL (ref 3.87–5.11)
RDW: 12 % (ref 11.5–15.5)
WBC: 4.6 10*3/uL (ref 4.0–10.5)
nRBC: 0 % (ref 0.0–0.2)

## 2021-06-02 LAB — HM MAMMOGRAPHY

## 2021-06-02 MED ORDER — ANASTROZOLE 1 MG PO TABS: 1.0000 mg | ORAL_TABLET | Freq: Every day | ORAL | 4 refills | Status: DC

## 2021-06-09 ENCOUNTER — Ambulatory Visit: Payer: Self-pay | Admitting: Podiatry

## 2021-06-20 ENCOUNTER — Emergency Department (HOSPITAL_BASED_OUTPATIENT_CLINIC_OR_DEPARTMENT_OTHER)
Admission: EM | Admit: 2021-06-20 | Discharge: 2021-06-20 | Disposition: A | Discharge status: Home / Self Care | Payer: BC Managed Care – PPO | Attending: Emergency Medicine | Admitting: Emergency Medicine

## 2021-06-20 ENCOUNTER — Other Ambulatory Visit: Payer: Self-pay

## 2021-06-20 DIAGNOSIS — I10 Essential (primary) hypertension: Secondary | ICD-10-CM | POA: Insufficient documentation

## 2021-06-20 DIAGNOSIS — R0789 Other chest pain: Secondary | ICD-10-CM | POA: Insufficient documentation

## 2021-06-20 DIAGNOSIS — Z79899 Other long term (current) drug therapy: Secondary | ICD-10-CM | POA: Diagnosis not present

## 2021-06-20 DIAGNOSIS — R059 Cough, unspecified: Secondary | ICD-10-CM | POA: Diagnosis not present

## 2021-06-20 DIAGNOSIS — R079 Chest pain, unspecified: Secondary | ICD-10-CM | POA: Diagnosis present

## 2021-06-20 DIAGNOSIS — Z853 Personal history of malignant neoplasm of breast: Secondary | ICD-10-CM | POA: Diagnosis not present

## 2021-06-20 MED ORDER — KETOROLAC TROMETHAMINE 30 MG/ML IJ SOLN
30.0000 mg | Freq: Once | INTRAMUSCULAR | Status: AC
  Administered 2021-06-20: 30 mg via INTRAMUSCULAR
  Filled 2021-06-20: qty 1

## 2021-06-20 NOTE — ED Triage Notes (Signed)
Presents with soreness type complaint under left breast, onset 3 days ago, coughing makes it worse.

## 2021-06-20 NOTE — ED Provider Notes (Signed)
[facility] EMERGENCY DEPARTMENT Provider Note   CSN: [id number] Arrival date & time: 06/20/21  1038     History Chief Complaint  Patient presents with   Cough    Paige Foster is a 55 y.o. female.   Cough Associated symptoms: chest pain   Associated symptoms: no chills, no ear pain, no fever, no rash, no shortness of breath and no sore throat    55 year old female with a history of recurrent breast cancer presenting to the emergency department with complaint of chest wall discomfort.  The patient presents with a soreness under her left breast along her chest wall with onset 3 days ago.  No masses.  No cough, no fever or chills.  No internal chest pain.  When she coughs or clears her throat she notices the discomfort.  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    Recurrent breast cancer, left [facility]) oncologist-- dr Jana Hakim    dx 2004, noninvasive Stage 0 ----s/p left mastectomy w/ tram flap construction (and right breast reduction), taken Tamoxifen for 3 yrs;   08-30-2018 recurrent left cancer , Grade III,  cT1c,  ER positive, PR negative, HER-2 positive, invasive ductal carcinoma-- neoadjuvant chemo to start 09-26-2018   Renal artery stenosis (HCC)    mild right external renal artery stenosis per duplex in epic 08-09-2013   Wears glasses     Patient Active Problem List   Diagnosis Date Noted   Osteopenia 08/16/2019   Genetic testing 02/05/2019   Port-A-Cath in place 09/28/2018   Recurrent breast cancer, left ([place]) 09/07/2018   Malignant neoplasm of upper-outer quadrant of left breast in female, estrogen receptor positive ([place]) 09/06/2018   Dyspepsia 11/25/2014   HTN (hypertension) 07/10/2013   HEMORRHOIDS-INTERNAL 04/23/2009   PERSONAL HX COLONIC POLYPS 04/23/2009   HEMORRHOID, THROMBOSED 03/12/2009    Past  Surgical History:  Procedure Laterality Date   BREAST LUMPECTOMY WITH RADIOACTIVE SEED LOCALIZATION Left 02/20/2019   Procedure: LEFT BREAST LUMPECTOMY WITH RADIOACTIVE SEED LOCALIZATION;  Surgeon: Rolm Bookbinder, MD;  Location: [place];  Service: General;  Laterality: Left;   BREAST SURGERY Left    Transflap   COLONOSCOPY     COLONOSCOPY     MASTECTOMY Left 2004   w/  TRAM flap construction and right breast augmentation with abdominoplasy   PARS PLANA VITRECTOMY Left 12/18/2018   Procedure: PARS PLANA VITRECTOMY WITH 25 GAUGE, ENDOLASER;  Surgeon: Jalene Mullet, MD;  Location: [place];  Service: Ophthalmology;  Laterality: Left;   PORTACATH PLACEMENT N/A 09/25/2018   Procedure: INSERTION PORT-A-CATH WITH ULTRASOUND;  Surgeon: Rolm Bookbinder, MD;  [facility];  Service: General;  Laterality: N/A;   TUBAL LIGATION Bilateral yrs ago     OB History   No obstetric history on file.     Family History  Problem Relation Age of Onset   Diabetes Mother    Hypertension Mother    Kidney disease Father    Colon cancer Neg Hx    Colon polyps Neg Hx    Gallbladder disease Neg Hx    Heart disease Neg Hx    Esophageal cancer Neg Hx    Stomach cancer Neg Hx    Rectal cancer Neg Hx     Social History   Tobacco Use   Smoking status: Never  Smokeless tobacco: Never  Vaping Use   Vaping Use: Never used  Substance Use Topics   Alcohol use: Not Currently    Alcohol/week: 0.0 standard drinks    Comment: Occassionally   Drug use: No    Home Medications Prior to Admission medications   Medication Sig Start Date End Date Taking? Authorizing Provider  amLODipine (NORVASC) 10 MG tablet TAKE 1 TABLET(10 MG) BY MOUTH DAILY 03/18/21   Haydee Salter, MD  anastrozole (ARIMIDEX) 1 MG tablet Take 1 tablet (1 mg total) by mouth daily. 06/02/21   Magrinat, Virgie Dad, MD  losartan (COZAAR) 50 MG tablet Take 1 tablet (50 mg total) by mouth daily. 08/13/20   Nche, Charlene Brooke, NP  prednisoLONE  acetate (PRED FORTE) 1 % ophthalmic suspension Place 1 drop into the left eye 4 (four) times daily.    [provider]  prochlorperazine (COMPAZINE) 10 MG tablet Take 1 tablet (10 mg total) by mouth every 6 (six) hours as needed (Nausea or vomiting). 09/18/18 01/11/19  Magrinat, Virgie Dad, MD    Allergies    Patient has no known allergies.  Review of Systems   Review of Systems  Constitutional:  Negative for chills and fever.  HENT:  Negative for ear pain and sore throat.   Eyes:  Negative for pain and visual disturbance.  Respiratory:  Negative for cough and shortness of breath.   Cardiovascular:  Positive for chest pain. Negative for palpitations.  Gastrointestinal:  Negative for abdominal pain and vomiting.  Genitourinary:  Negative for dysuria and hematuria.  Musculoskeletal:  Negative for arthralgias and back pain.  Skin:  Negative for color change and rash.  Neurological:  Negative for seizures and syncope.  All other systems reviewed and are negative.  Physical Exam Updated Vital Signs BP 129/86   Pulse 60   Temp 98.8 F (37.1 C)   Resp 17   Ht _0  (1.575 m)   Wt 70.3 kg   LMP 06/08/2007 Comment: Just stopped having period  SpO2 99%   BMI 28.35 kg/m   Physical Exam Vitals and nursing note reviewed.  Constitutional:      General: She is not in acute distress. HENT:     Head: Normocephalic and atraumatic.  Eyes:     Conjunctiva/sclera: Conjunctivae normal.     Pupils: Pupils are equal, round, and reactive to light.  Cardiovascular:     Rate and Rhythm: Normal rate and regular rhythm.  Pulmonary:     Effort: Pulmonary effort is normal. No respiratory distress.  Chest:     Chest wall: Tenderness present. No mass, swelling, crepitus or edema.     Comments: Mild chest wall tenderness to palpation beneath the left breast, no breast mass or erythema, crepitus or fluctuance noted. Abdominal:     General: There is no distension.     Tenderness: There is no  guarding.  Musculoskeletal:        General: No deformity or signs of injury.     Cervical back: Neck supple.  Skin:    Findings: No lesion or rash.  Neurological:     General: No focal deficit present.     Mental Status: She is alert. Mental status is at baseline.    ED Results / Procedures / Treatments   Labs (all labs ordered are listed, but only abnormal results are displayed) Labs Reviewed - No data to display  EKG EKG Interpretation  Date/Time:  Saturday June 20 2021 14:47:12 EST Ventricular Rate:  47 PR Interval:  156 QRS Duration: 92 QT Interval:  455 QTC Calculation: 403 R Axis:   13 Text Interpretation: Sinus bradycardia Atrial premature complex Confirmed by Regan Lemming ([id number]) on 06/20/2021 2:50:35 PM  Radiology No results found.  Procedures Procedures   Medications Ordered in ED Medications  ketorolac (TORADOL) 30 MG/ML injection 30 mg (30 mg Intramuscular Given 06/20/21 1400)    ED Course  I have reviewed the triage vital signs and the nursing notes.  Pertinent labs & imaging results that were available during my care of the patient were reviewed by me and considered in my medical decision making (see chart for details).    MDM Rules/Calculators/A&P                           55 year old female with a history of recurrent breast cancer presenting to the emergency department with complaint of chest wall discomfort.  The patient presents with a soreness under her left breast along her chest wall with onset 3 days ago.  No masses.  No cough, no fever or chills.  No internal chest pain.  When she coughs or clears her throat she notices the discomfort.  The patient's EKG was without ischemic changes.  On exam, mild chest wall tenderness to palpation beneath the left breast was noted, no breast mass or erythema, crepitus or fluctuance noted.  Symptoms are most consistent with musculoskeletal chest pain vs pleuritis.  Given the patient's history of breast  cancer, considered PE although patient is not tachycardic, not tachypneic, with no cough, no hemoptysis, no recent lower extremity swelling or edema.  No clear mass appreciated on physical exam to suggest recurrence of breast cancer.  I do not think emergent imaging is warranted at this time.  Advised NSAIDs for pain control and PCP follow-up as needed.  Final Clinical Impression(s) / ED Diagnoses Final diagnoses:  Musculoskeletal chest pain    Rx / DC Orders ED Discharge Orders     None        Regan Lemming, MD 06/20/21 1749

## 2021-06-20 NOTE — Discharge Instructions (Signed)
You were evaluated in the Emergency Department and after careful evaluation, we did not find any emergent condition requiring admission or further testing in the hospital.  Your exam/testing today was overall reassuring. You have reproducible tenderness of your chest wall without evidence of skin infection or soft tissue mass. Your symptoms are consistent with musculoskeletal chest wall pain.   Please return to the Emergency Department if you experience any worsening of your condition.  Thank you for allowing [place] to be a part of your care.

## 2021-07-29 ENCOUNTER — Encounter: Payer: Self-pay | Admitting: Oncology

## 2021-07-30 ENCOUNTER — Encounter: Payer: Self-pay | Admitting: Oncology

## 2021-07-31 ENCOUNTER — Other Ambulatory Visit: Payer: Self-pay | Admitting: Hematology and Oncology

## 2021-07-31 ENCOUNTER — Encounter: Payer: Self-pay | Admitting: Hematology and Oncology

## 2021-07-31 ENCOUNTER — Other Ambulatory Visit: Payer: Self-pay | Admitting: *Deleted

## 2021-07-31 ENCOUNTER — Inpatient Hospital Stay: Payer: Managed Care, Other (non HMO) | Attending: Oncology | Admitting: Hematology and Oncology

## 2021-07-31 ENCOUNTER — Ambulatory Visit (HOSPITAL_COMMUNITY)
Admission: RE | Admit: 2021-07-31 | Discharge: 2021-07-31 | Disposition: A | Discharge status: Home / Self Care | Payer: Managed Care, Other (non HMO) | Source: Ambulatory Visit | Attending: Hematology and Oncology | Admitting: Hematology and Oncology

## 2021-07-31 ENCOUNTER — Other Ambulatory Visit: Payer: Self-pay

## 2021-07-31 VITALS — BP 124/76 | HR 72 | Temp 97.8°F | Resp 17 | Wt 153.5 lb

## 2021-07-31 DIAGNOSIS — Z79899 Other long term (current) drug therapy: Secondary | ICD-10-CM | POA: Insufficient documentation

## 2021-07-31 DIAGNOSIS — Z170 Estrogen receptor positive status [ER+]: Secondary | ICD-10-CM | POA: Insufficient documentation

## 2021-07-31 DIAGNOSIS — C50912 Malignant neoplasm of unspecified site of left female breast: Secondary | ICD-10-CM | POA: Insufficient documentation

## 2021-07-31 DIAGNOSIS — Z8719 Personal history of other diseases of the digestive system: Secondary | ICD-10-CM | POA: Insufficient documentation

## 2021-07-31 DIAGNOSIS — C50412 Malignant neoplasm of upper-outer quadrant of left female breast: Secondary | ICD-10-CM

## 2021-07-31 DIAGNOSIS — Z13820 Encounter for screening for osteoporosis: Secondary | ICD-10-CM | POA: Diagnosis not present

## 2021-07-31 DIAGNOSIS — Z79811 Long term (current) use of aromatase inhibitors: Secondary | ICD-10-CM | POA: Diagnosis not present

## 2021-07-31 DIAGNOSIS — G629 Polyneuropathy, unspecified: Secondary | ICD-10-CM | POA: Diagnosis not present

## 2021-07-31 DIAGNOSIS — M8580 Other specified disorders of bone density and structure, unspecified site: Secondary | ICD-10-CM | POA: Diagnosis not present

## 2021-07-31 DIAGNOSIS — I10 Essential (primary) hypertension: Secondary | ICD-10-CM | POA: Insufficient documentation

## 2021-07-31 DIAGNOSIS — Z9012 Acquired absence of left breast and nipple: Secondary | ICD-10-CM | POA: Insufficient documentation

## 2021-07-31 DIAGNOSIS — R0789 Other chest pain: Secondary | ICD-10-CM | POA: Diagnosis not present

## 2021-07-31 DIAGNOSIS — Z8249 Family history of ischemic heart disease and other diseases of the circulatory system: Secondary | ICD-10-CM | POA: Diagnosis not present

## 2021-07-31 DIAGNOSIS — Z833 Family history of diabetes mellitus: Secondary | ICD-10-CM | POA: Insufficient documentation

## 2021-07-31 NOTE — Progress Notes (Signed)
[facility]  [phone] [phone]    ID: Paige Foster DOB: 1965/08/11  MR#: [id number]  OIZ#:[id number]  Patient Care Team: Flossie Buffy, NP as PCP - General (Internal Medicine) Magrinat, Virgie Dad, MD as Consulting Physician (Oncology) Rolm Bookbinder, MD as Consulting Physician (General Surgery) Nche, Charlene Brooke, NP as Nurse Practitioner (Internal Medicine) Jalene Mullet, MD as Consulting Physician (Ophthalmology) Larey Dresser, MD as Consulting Physician (Cardiology) Cristine Polio, MD as Consulting Physician (Plastic Surgery) Loletha Carrow Kirke Corin, MD as Consulting Physician (Gastroenterology) Idolina Primer [facility]) OTHER MD:    CHIEF COMPLAINT: 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  She has been on anastrozole since December 2020.  She is tolerating the medication very well, she denies any adverse effects.  Since her last visit with Dr. Jana Hakim, she went to the ER with chief complaint of pain at the lumpectomy site in her left breast on November 26.  They thought this is most likely musculoskeletal since she had a mammogram which was BI-RADS 2 and since with was a pinpoint tenderness.  They have recommended anti-inflammatory drugs.  She has been taking ibuprofen 2 tablets in the morning and 2 tablets in the evening likely 200 mg each tablet.  From Thanksgiving, the pain seems to have subsided a bit but she still can feel it when she touches the area.  She has been wearing wired bras.  She has also been taking her vitamin D as prescribed.  She has not had a bone density scheduled, last bone density showed a T score of -1.6 consistent with mild osteopenia back in 2017.  She otherwise denies any cough, shortness of breath, chest pressure, change in bowel habits or  urinary habits.  No new neurological complaints.  REVIEW OF SYSTEMS:   A detailed review of systems today was otherwise stable.   COVID 19 VACCINATION STATUS: Has had both Pfizer shots plus 1 booster October 2021   HISTORY OF CURRENT ILLNESS: From the original intake note:  Paige Foster has a prior history of left breast cancer, dating back to 2004. At that time she underwent a left mastectomy for stage 0 (noninvasive) breast cancer, with transverse rectus abdominis (TRAM) flap construction under Dr. Towanda Malkin. She also underwent a right breast reduction. She took tamoxifen for three years.  More recently she underwent bilateral diagnostic mammography with tomography and left breast ultrasonography at [facility] on 01/03/2018 showing: Breast Density Category B. There is an oval fat containing lesion in the left breast upper outer quadrant posterior depth. No other significant masses, calcifications, or other findings are seen in either breast. Sonographically, there is a 1.5 cm lesion in the left breast upper outer quadrant posterior depth. This lesion is of mixed echogenicity. This correlates as palpated and with mammography findings. Follow up was recommended.  Close follow-up was suggested.  She then presented with a non-tender mass in the left reconstructed breast on 08/30/2018. On physical exam, there is a hard palpable lump measuring 2.0 cm in the upper outer left reconstructed breast 10 cm from the expected location of a nipple. Sonography over this area demonstrates a 1.8 cm x 1.7 cm x 1.4 cm mass in the left breast at 2 o'clock posterior depth 10 cm from the nipple. This mass is of mixed echogenicity. This abnormality is increased in size and correlates as  palpated and with prior mammography findings. Color flow imaging demonstrates that there is vascularity present. Elastography imaging assessment is intermediate. No significant abnormalities were seen sonographically in the left axilla.     Accordingly on 08/30/2018 she proceeded to biopsy of the left breast mass in question. The pathology from this procedure showed ([id number]): invasive ductal carcinoma, grade III. Prognostic indicators significant for: estrogen receptor, 100% positive with strong staining intensity and progesterone receptor, 0% negative. Proliferation marker Ki67 at 15%. HER2 positive (3+) by immunohistochemistry.  The patient's subsequent history is as detailed below.   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    Recurrent breast cancer, left [facility]) oncologist-- dr Jana Hakim    dx 2004, noninvasive Stage 0 ----s/p left mastectomy w/ tram flap construction (and right breast reduction), taken Tamoxifen for 3 yrs;   08-30-2018 recurrent left cancer , Grade III,  cT1c,  ER positive, PR negative, HER-2 positive, invasive ductal carcinoma-- neoadjuvant chemo to start 09-26-2018   Renal artery stenosis (HCC)    mild right external renal artery stenosis per duplex in epic 08-09-2013   Wears glasses     PAST SURGICAL HISTORY: Past Surgical History:  Procedure Laterality Date   BREAST LUMPECTOMY WITH RADIOACTIVE SEED LOCALIZATION Left 02/20/2019   Procedure: LEFT BREAST LUMPECTOMY WITH RADIOACTIVE SEED LOCALIZATION;  Surgeon: Rolm Bookbinder, MD;  Location: [facility];  Service: General;  Laterality: Left;   BREAST SURGERY Left    Transflap   COLONOSCOPY     COLONOSCOPY     MASTECTOMY Left 2004   w/  TRAM flap construction and right breast augmentation with abdominoplasy   PARS PLANA VITRECTOMY Left 12/18/2018   Procedure: PARS PLANA VITRECTOMY WITH 25 GAUGE, ENDOLASER;  Surgeon: Jalene Mullet, MD;  Location: [facility];  Service: Ophthalmology;  Laterality: Left;   PORTACATH PLACEMENT N/A 09/25/2018   Procedure: INSERTION PORT-A-CATH WITH ULTRASOUND;   Surgeon: Rolm Bookbinder, MD;  Location: [facility];  Service: General;  Laterality: N/A;   TUBAL LIGATION Bilateral yrs ago    FAMILY HISTORY: Family History  Problem Relation Age of Onset   Diabetes Mother    Hypertension Mother    Kidney disease Father    Colon cancer Neg Hx    Colon polyps Neg Hx    Gallbladder disease Neg Hx    Heart disease Neg Hx    Esophageal cancer Neg Hx    Stomach cancer Neg Hx    Rectal cancer Neg Hx   Rainelle's father died from unknown causes in his early 32's. Patients' mother died from diabetes complications at age 43. The patient has 1 sister. Patient denies anyone in her family having breast, ovarian, prostate, or pancreatic cancer.    GYNECOLOGIC HISTORY:  Patient's last menstrual period was 06/08/2007. 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 Her husband, Elwin Mocha, works at [employer]. Alaia has two children, Vonna Kotyk and Shanon Brow. Vonna Kotyk lives with her, is 49, and it attending [employer] for a computer based degree. Shanon Brow lives with her, is 74, and recently graduated from [employer] with a degree in Careers information officer.  He works for [employer].  Deshawna has no grandchildren. She attends the [employer].   ADVANCED DIRECTIVES: In the absence of any documents to the contrary her husband, Elwin Mocha, is automatically her healthcare power of attorney     HEALTH MAINTENANCE: Social History   Tobacco Use   Smoking status: Never   Smokeless tobacco: Never  Vaping Use   Vaping Use: Never used  Substance Use Topics   Alcohol use: Not Currently    Alcohol/week: 0.0 standard drinks    Comment: Occassionally   Drug use: No    Colonoscopy: April 2022, danis  PAP: January 2022, Nche  Bone density:  2017; -1.6, osteopenic   No Known Allergies  Current Outpatient Medications   Medication Sig Dispense Refill   amLODipine (NORVASC) 10 MG tablet TAKE 1 TABLET(10 MG) BY MOUTH DAILY 90 tablet 1   anastrozole (ARIMIDEX) 1 MG tablet Take 1 tablet (1 mg total) by mouth daily. 90 tablet 4   losartan (COZAAR) 50 MG tablet Take 1 tablet (50 mg total) by mouth daily. 90 tablet 3   prednisoLONE acetate (PRED FORTE) 1 % ophthalmic suspension Place 1 drop into the left eye 4 (four) times daily.     No current facility-administered medications for this visit.     OBJECTIVE: African-American woman who appears stated age  There were no vitals filed for this visit.    There is no height or weight on file to calculate BMI.   Wt Readings from Last 3 Encounters:  06/20/21 155 lb (70.3 kg)  06/02/21 151 lb 3.2 oz (68.6 kg)  11/24/20 148 lb 9.6 oz (67.4 kg)   Sclerae unicteric, EOMs intact Wearing a mask No cervical or supraclavicular adenopathy Lungs no rales or rhonchi Heart regular rate and rhythm Abd soft, nontender, positive bowel sounds MSK no focal spinal tenderness, no upper extremity lymphedema Neuro: nonfocal, well oriented, appropriate affect Breasts: The right breast is status post reduction mammoplasty.  Left breast inspection, no visible masses.  Palpation of left breast with no palpable lumps.  There appears to be a point tenderness at the inframammary fold, likely musculoskeletal tenderness. Rest of the pertinent 10 point ROS reviewed and negative.   LAB RESULTS:  CMP     Component Value Date/Time   NA 137 06/02/2021 1046   NA 140 10/12/2017 0950   K 3.8 06/02/2021 1046   CL 103 06/02/2021 1046   CO2 26 06/02/2021 1046   GLUCOSE 100 (H) 06/02/2021 1046   BUN 12 06/02/2021 1046   BUN 10 10/12/2017 0950   CREATININE 0.72 06/02/2021 1046   CREATININE 0.78 09/07/2018 1455   CALCIUM 9.9 06/02/2021 1046   PROT 7.7 06/02/2021 1046   PROT 7.5 10/12/2017 0950   ALBUMIN 4.1 06/02/2021 1046   ALBUMIN 4.3 10/12/2017 0950   AST 17 06/02/2021 1046   AST 14  (L) 09/07/2018 1455   ALT 13 06/02/2021 1046   ALT 13 09/07/2018 1455   ALKPHOS 100 06/02/2021 1046   BILITOT 0.3 06/02/2021 1046   BILITOT 0.2 (L) 09/07/2018 1455   GFRNONAA >60 06/02/2021 1046   GFRNONAA >60 09/07/2018 1455   GFRAA >60 12/06/2019 1441   GFRAA >60 09/07/2018 1455   Lab Results  Component Value Date   WBC 4.6 06/02/2021   NEUTROABS 2.6 06/02/2021   HGB 12.9 06/02/2021   HCT 37.6 06/02/2021   MCV 92.4 06/02/2021   PLT 228 06/02/2021    Lab Results  Component Value Date   LABCA2 <4 08/30/2007    No  components found for: ACZYSA630  No results for input(s): INR in the last 168 hours.  Lab Results  Component Value Date   LABCA2 <4 08/30/2007    No results found for: ZSW109  No results found for: NAT557  No results found for: DUK025  No results found for: CA2729  No components found for: HGQUANT  No results found for: CEA1 / No results found for: CEA1  No results found for: AFPTUMOR  No results found for: CHROMOGRNA  No results found for: TOTALPROTELP, ALBUMINELP, A1GS, A2GS, BETS, BETA2SER, GAMS, MSPIKE, SPEI (this displays SPEP labs)  No results found for: KPAFRELGTCHN, LAMBDASER, KAPLAMBRATIO (kappa/lambda light chains)  No results found for: HGBA, HGBA2QUANT, HGBFQUANT, HGBSQUAN (Hemoglobinopathy evaluation)   Lab Results  Component Value Date   LDH 169 08/30/2007    Lab Results  Component Value Date   IRON 109 08/13/2020   TIBC 400 08/13/2020   IRONPCTSAT 27 08/13/2020   (Iron and TIBC)  Lab Results  Component Value Date   FERRITIN 122 08/13/2020    Urinalysis    Component Value Date/Time   LABSPEC 1.015 04/09/2008 1548   PHURINE 7.0 04/09/2008 1548   HGBUR large 04/09/2008 1548   BILIRUBINUR negative 04/09/2008 1548   UROBILINOGEN 0.2 04/09/2008 1548   NITRITE negative 04/09/2008 1548    STUDIES:  No results found.   ELIGIBLE FOR AVAILABLE RESEARCH PROTOCOL: No  ASSESSMENT: 56 y.o. Paige Foster, [place]  woman  (1) history of left-sided ductal carcinoma in situ 2004  (a) s/p left mastectomy with TRAM reconstruction  (b) status post tamoxifen x3 years  (2) left breast upper outer quadrant biopsy 08/30/2018 shows a clinical T1c N0 invasive ductal carcinoma, grade 3, estrogen receptor strongly positive, progesterone receptor negative, with HER-2 amplification, and and MIB-1 of 15%.   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    (b) chemotherapy discontinued after 4 cycles because of intercurrent eye surgery  (c) continued trastuzumab and pertuzumab to complete a year (last dose 09/06/2019)  (d) echocardiogram on 01/23/2019 that shows well preserved EF of 60-65%  (e) echocardiogram on 04/23/2019 shows EF of 60-65%  (f) echocardiogram 08/02/2019 shows an ejection fraction in the 60-65% range  (4) left lumpectomy 02/20/2019 showed a residual  ypT1c NX invasive ductal carcinoma, grade 2 with negative margins.    (5) adjuvant radiation: Radiation Treatment Dates: 04/12/2019 through 05/28/2019 Site Technique Total Dose (Gy) Dose per Fx (Gy) Completed Fx Beam Energies  Breast: CW_Lt 3D 50.4/50.4 1.8 28/28 6X, 10X  Breast: CW_Lt_SCV_PAB 3D 50.4/50.4 1.8 28/28 6X, 10X  Breast: CW_Lt_Bst Electron 10/10 2 5/5 6X, 10X   (6) anastrozole started 06/26/2019  (a) DEX[facility] scan at [facility] 12/24/2015 found a T score of -1.6  (7) genetics testing 02/03/2019 through the Common Hereditary Cancers Panel offered by [facility] found no deleterious mutations in APC, ATM, AXIN2, BARD1, BMPR1A, BRCA1, BRCA2, BRIP1, CDH1, CDKN2A (p14ARF), CDKN2A (p16INK4a), CKD4, CHEK2, CTNNA1, DICER1, EPCAM  (Deletion/duplication testing only), GREM1 (promoter region deletion/duplication testing only), KIT, MEN1, MLH1, MSH2, MSH3, MSH6, MUTYH, NBN, NF1, NHTL1, PALB2, PDGFRA, PMS2, POLD1, POLE, PTEN, RAD50, RAD51C, RAD51D, RNF43, SDHB, SDHC, SDHD, SMAD4, SMARCA4. STK11, TP53, TSC1, TSC2, and VHL.  The following genes were evaluated for sequence changes  only: SDHA and HOXB13 c.251G>A variant only.   PLAN: Amen is now a little over 2 years out from de this is a very pleasant patient with patient is doing very well with Arimidex, no adverse effects reported.    Since her last visit, she has noticed some tenderness at the inframammary fold in the left breast and she is very worried about recurrence.  She went to the ER with these complaints and she was thought to have musculoskeletal tenderness, recommended NSAIDs.    She had mammogram recently in November 2022 which was BI-RADS 2.  On physical examination, I did not find any palpable masses in the breast.  She most likely has musculoskeletal tenderness hence have recommended a chest x-ray, rib series to evaluate for any bone abnormality.  I have encouraged her to try ibuprofen as recommended and encouraged hydration.    I have also encouraged her to schedule her bone density, reorder it.  I explained that Arimidex can increase risk of bone density loss.  She was recommended to continue vitamin D and calcium supplementation and weightbearing exercises as tolerated.  She was encouraged to contact us with any worsening pain.  Also encouraged her to switch to a sports bra with better support and to avoid wired bras.  She expressed understanding.  Benay Pike, MD Medical Oncology and Hematology [facility] [place], [place] [address] Tel. [phone]    Fax. [phone]  Total time spent, 30 minutes  *Total Encounter Time as defined by the Centers for Medicare and Medicaid Services includes, in addition to the face-to-face  time of a patient visit (documented in the note above) non-face-to-face time: obtaining and reviewing outside history, ordering and reviewing medications, tests or procedures, care coordination (communications with other health care professionals or caregivers) and documentation in the medical record.

## 2021-08-03 ENCOUNTER — Telehealth: Payer: Self-pay | Admitting: *Deleted

## 2021-08-03 NOTE — Telephone Encounter (Signed)
This RN called per rib film results and obtained verified identified VM.  Message left informing pt of results and recommendations for care of fx rib.  This RN's name and return call number given for contact.

## 2021-08-07 ENCOUNTER — Encounter: Payer: Self-pay | Admitting: Oncology

## 2021-10-05 DIAGNOSIS — H401111 Primary open-angle glaucoma, right eye, mild stage: Secondary | ICD-10-CM | POA: Diagnosis not present

## 2021-10-16 DIAGNOSIS — Z452 Encounter for adjustment and management of vascular access device: Secondary | ICD-10-CM | POA: Diagnosis not present

## 2021-10-26 ENCOUNTER — Other Ambulatory Visit: Payer: Self-pay | Admitting: *Deleted

## 2021-10-28 ENCOUNTER — Telehealth: Payer: Self-pay | Admitting: Hematology and Oncology

## 2021-10-28 NOTE — Telephone Encounter (Signed)
.  Called patient to schedule appointment per 4/5 inbasket, patient is aware of date and time.   ?

## 2021-10-30 ENCOUNTER — Inpatient Hospital Stay: Payer: BC Managed Care – PPO | Admitting: Hematology and Oncology

## 2021-11-17 ENCOUNTER — Encounter: Payer: Self-pay | Admitting: Oncology

## 2021-11-17 ENCOUNTER — Ambulatory Visit (INDEPENDENT_AMBULATORY_CARE_PROVIDER_SITE_OTHER): Payer: BC Managed Care – PPO | Admitting: Nurse Practitioner

## 2021-11-17 ENCOUNTER — Encounter: Payer: Self-pay | Admitting: Nurse Practitioner

## 2021-11-17 VITALS — BP 110/60 | HR 62 | Temp 97.2°F | Ht 62.0 in | Wt 149.4 lb

## 2021-11-17 DIAGNOSIS — I10 Essential (primary) hypertension: Secondary | ICD-10-CM | POA: Diagnosis not present

## 2021-11-17 DIAGNOSIS — D649 Anemia, unspecified: Secondary | ICD-10-CM | POA: Diagnosis not present

## 2021-11-17 DIAGNOSIS — S161XXA Strain of muscle, fascia and tendon at neck level, initial encounter: Secondary | ICD-10-CM | POA: Diagnosis not present

## 2021-11-17 DIAGNOSIS — E7800 Pure hypercholesterolemia, unspecified: Secondary | ICD-10-CM | POA: Insufficient documentation

## 2021-11-17 DIAGNOSIS — E876 Hypokalemia: Secondary | ICD-10-CM

## 2021-11-17 MED ORDER — CYCLOBENZAPRINE HCL 5 MG PO TABS: 5.0000 mg | ORAL_TABLET | Freq: Every day | ORAL | 0 refills | Status: DC

## 2021-11-17 MED ORDER — LOSARTAN POTASSIUM 50 MG PO TABS: 50.0000 mg | ORAL_TABLET | Freq: Every day | ORAL | 3 refills | Status: DC

## 2021-11-17 MED ORDER — NAPROXEN 500 MG PO TABS: 500.0000 mg | ORAL_TABLET | Freq: Two times a day (BID) | ORAL | 0 refills | Status: DC

## 2021-11-17 MED ORDER — AMLODIPINE BESYLATE 10 MG PO TABS: ORAL_TABLET | ORAL | 3 refills | Status: DC

## 2021-11-17 NOTE — Assessment & Plan Note (Signed)
Repeat lipid panel ?

## 2021-11-17 NOTE — Patient Instructions (Signed)
Go to lab ?Start daily neck exercise ?Change pillow to provide neck position at bedtime. ?Take naproxen BID with food x 7days ?Take flexeril at bedtime x 7days. ?Call office if no improvement in 7-14days ? ?Cervical Strain and Sprain Rehab ?Ask your health care provider which exercises are safe for you. Do exercises exactly as told by your health care provider and adjust them as directed. It is normal to feel mild stretching, pulling, tightness, or discomfort as you do these exercises. Stop right away if you feel sudden pain or your pain gets worse. Do not begin these exercises until told by your health care provider. ?Stretching and range-of-motion exercises ?Cervical side bending ? ?Using good posture, sit on a stable chair or stand up. ?Without moving your shoulders, slowly tilt your left / right ear to your shoulder until you feel a stretch in the opposite side neck muscles. You should be looking straight ahead. ?Hold for __________ seconds. ?Repeat with the other side of your neck. ?Repeat __________ times. Complete this exercise __________ times a day. ?Cervical rotation ? ?Using good posture, sit on a stable chair or stand up. ?Slowly turn your head to the side as if you are looking over your left / right shoulder. ?Keep your eyes level with the ground. ?Stop when you feel a stretch along the side and the back of your neck. ?Hold for __________ seconds. ?Repeat this by turning to your other side. ?Repeat __________ times. Complete this exercise __________ times a day. ?Thoracic extension and pectoral stretch ? ?Roll a towel or a small blanket so it is about 4 inches (10 cm) in diameter. ?Lie down on your back on a firm surface. ?Put the towel in the middle of your back across your spine. It should not be under your shoulder blades. ?Put your hands behind your head and let your elbows fall out to your sides. ?Hold for __________ seconds. ?Repeat __________ times. Complete this exercise __________ times a  day. ?Strengthening exercises ?Isometric upper cervical flexion ? ?Lie on your back with a thin pillow behind your head and a small rolled-up towel under your neck. ?Gently tuck your chin toward your chest and nod your head down to look toward your feet. Do not lift your head off the pillow. ?Hold for __________ seconds. ?Release the tension slowly. Relax your neck muscles completely before you repeat this exercise. ?Repeat __________ times. Complete this exercise __________ times a day. ?Isometric cervical extension ? ?Stand about 6 inches (15 cm) away from a wall, with your back facing the wall. ?Place a soft object, about 6-8 inches (15-20 cm) in diameter, between the back of your head and the wall. A soft object could be a small pillow, a ball, or a folded towel. ?Gently tilt your head back and press into the soft object. Keep your jaw and forehead relaxed. ?Hold for __________ seconds. ?Release the tension slowly. Relax your neck muscles completely before you repeat this exercise. ?Repeat __________ times. Complete this exercise __________ times a day. ?Posture and body mechanics ?Body mechanics refers to the movements and positions of your body while you do your daily activities. Posture is part of body mechanics. Good posture and healthy body mechanics can help to relieve stress in your body's tissues and joints. Good posture means that your spine is in its natural S-curve position (your spine is neutral), your shoulders are pulled back slightly, and your head is not tipped forward. The following are general guidelines for applying improved posture and body  mechanics to your everyday activities. ?Sitting ? ?When sitting, keep your spine neutral and keep your feet flat on the floor. Use a footrest, if necessary, and keep your thighs parallel to the floor. Avoid rounding your shoulders, and avoid tilting your head forward. ?When working at a desk or a computer, keep your desk at a height where your hands are  slightly lower than your elbows. Slide your chair under your desk so you are close enough to maintain good posture. ?When working at a computer, place your monitor at a height where you are looking straight ahead and you do not have to tilt your head forward or downward to look at the screen. ?Standing ? ?When standing, keep your spine neutral and keep your feet about hip-width apart. Keep a slight bend in your knees. Your ears, shoulders, and hips should line up. ?When you do a task in which you stand in one place for a long time, place one foot up on a stable object that is 2-4 inches (5-10 cm) high, such as a footstool. This helps keep your spine neutral. ?Resting ?When lying down and resting, avoid positions that are most painful for you. Try to support your neck in a neutral position. You can use a contour pillow or a small rolled-up towel. Your pillow should support your neck but not push on it. ?This information is not intended to replace advice given to you by your health care provider. Make sure you discuss any questions you have with your health care provider. ?Document Revised: 06/01/2021 Document Reviewed: 06/01/2021 ?Elsevier Patient Education ? [place]. ? ?

## 2021-11-17 NOTE — Assessment & Plan Note (Signed)
Removed 10/16/21 ?

## 2021-11-17 NOTE — Assessment & Plan Note (Addendum)
Chronic, no GI/GU bleed, last colonoscopy 2022 (melanosis and diverticulosis, repeat in 57yr) ?repeat cbc and iron panel. ? ? ?

## 2021-11-17 NOTE — Progress Notes (Signed)
? ?             Established Patient Visit ? ?Patient: Paige Foster   DOB: 1966/03/08   56 y.o. Female  MRN: [id number] ?Visit Date: 11/17/2021 ? ?Subjective:  ?  ?Chief Complaint  ?Patient presents with  ? Acute Visit  ?  Pt c/o neck pain.  ?Pt states she had her port taken out 10/16/21 and has noticed since she has been experiencing neck and ear pain on that right side since.   ? ?Neck Pain  ?This is a new problem. The current episode started more than 1 month ago. The problem occurs constantly. The problem has been unchanged. The pain is associated with a sleep position. The pain is present in the right side. The quality of the pain is described as aching. The symptoms are aggravated by twisting and bending. The pain is Worse during the night. Pertinent negatives include no chest pain, fever, headaches, leg pain, numbness, pain with swallowing, paresis, photophobia, syncope, tingling, trouble swallowing, visual change, weakness or weight loss. Treatments tried: tylenol and CBD oil. The treatment provided significant relief.  ?Onset of pain prior to porta cath removal on 10/16/2021. She thoguh that will resolve pain, but it did not. ? ?HTN (hypertension) ?BP at goal with amlodipine and losartan ?BP Readings from Last 3 Encounters:  ?11/17/21 110/60  ?07/31/21 124/76  ?06/20/21 129/86  ? ?Maintain med doses ?Refills sent ?Check BMP ?F/up in 8month ? ?Pure hypercholesterolemia ?Repeat lipid panel ? ?Port-A-Cath in place ?Removed 10/16/21 ? ?Anemia ?Chronic, no GI/GU bleed, last colonoscopy 2022 (melanosis and diverticulosis, repeat in 110yr ?repeat cbc and iron panel. ? ?  ?Wt Readings from Last 3 Encounters:  ?11/17/21 149 lb 6.4 oz (67.8 kg)  ?07/31/21 153 lb 8 oz (69.6 kg)  ?06/20/21 155 lb (70.3 kg)  ?  ?Reviewed medical, surgical, and social history today ? ?Medications: ?Outpatient Medications Prior to Visit  ?Medication Sig  ? anastrozole (ARIMIDEX) 1 MG tablet Take 1 tablet (1 mg total) by mouth daily.  ?  prednisoLONE acetate (PRED FORTE) 1 % ophthalmic suspension Place 1 drop into the left eye 4 (four) times daily.  ? [DISCONTINUED] amLODipine (NORVASC) 10 MG tablet TAKE 1 TABLET(10 MG) BY MOUTH DAILY  ? [DISCONTINUED] losartan (COZAAR) 50 MG tablet Take 1 tablet (50 mg total) by mouth daily.  ? ?No facility-administered medications prior to visit.  ? ?Reviewed past medical and social history.  ? ?ROS per HPI above ? ? ?   ?Objective:  ?BP 110/60 (BP Location: Left Arm, Patient Position: Sitting, Cuff Size: Normal)   Pulse 62   Temp (!) 97.2 ?F (36.2 ?C) (Temporal)   Ht '5\' 2"'$  (1.575 m)   Wt 149 lb 6.4 oz (67.8 kg)   LMP 06/08/2007 Comment: Just stopped having period  SpO2 98%   BMI 27.33 kg/m?  ? ?  ? ?Physical Exam ?Constitutional:   ?   General: She is not in acute distress. ?Neck:  ?   Thyroid: No thyroid mass, thyromegaly or thyroid tenderness.  ?   Trachea: Trachea and phonation normal.  ? ?Cardiovascular:  ?   Rate and Rhythm: Normal rate and regular rhythm.  ?   Pulses: Normal pulses.  ?   Heart sounds: Normal heart sounds.  ?Pulmonary:  ?   Effort: Pulmonary effort is normal.  ?   Breath sounds: Normal breath sounds.  ?Musculoskeletal:     ?   General: Normal range of motion.  ?  Cervical back: Normal range of motion and neck supple. Tenderness present. No erythema, torticollis or crepitus. Pain with movement and muscular tenderness present. No spinous process tenderness. Normal range of motion.  ?Lymphadenopathy:  ?   Cervical: No cervical adenopathy.  ?Neurological:  ?   Mental Status: She is alert and oriented to person, place, and time.  ?Psychiatric:     ?   Mood and Affect: Mood normal.     ?   Behavior: Behavior normal.     ?   Thought Content: Thought content normal.  ?  ?No results found for any visits on 11/17/21. ?   ?Assessment & Plan:  ? ?Problem List Items Addressed This Visit   ? ?  ? Cardiovascular and Mediastinum  ? HTN (hypertension)  ?  BP at goal with amlodipine and losartan ?BP  Readings from Last 3 Encounters:  ?11/17/21 110/60  ?07/31/21 124/76  ?06/20/21 129/86  ? ?Maintain med doses ?Refills sent ?Check BMP ?F/up in 62month ?  ?  ? Relevant Medications  ? amLODipine (NORVASC) 10 MG tablet  ? losartan (COZAAR) 50 MG tablet  ? Other Relevant Orders  ? Basic metabolic panel  ?  ? Other  ? Anemia  ?  Chronic, no GI/GU bleed, last colonoscopy 2022 (melanosis and diverticulosis, repeat in 158yr ?repeat cbc and iron panel. ? ? ? ?  ?  ? Relevant Orders  ? Iron, TIBC and Ferritin Panel  ? CBC with Differential/Platelet  ? Pure hypercholesterolemia  ?  Repeat lipid panel ? ?  ?  ? Relevant Medications  ? amLODipine (NORVASC) 10 MG tablet  ? losartan (COZAAR) 50 MG tablet  ? Other Relevant Orders  ? Lipid panel  ? ?Other Visit Diagnoses   ? ? Neck muscle strain, initial encounter    -  Primary  ? Relevant Medications  ? naproxen (NAPROSYN) 500 MG tablet  ? cyclobenzaprine (FLEXERIL) 5 MG tablet  ? ?  ?Start daily neck exercise ?Change pillow to provide neck position at bedtime. ?Take naproxen BID with food x 7days ?Take flexeril at bedtime x 7days. ?Call office if no improvement in 7-14days ?Return in about 6 months (around 05/19/2022) for CPE (fasting). ? ?  ? ?ChWilfred LacyNP ? ? ?

## 2021-11-17 NOTE — Assessment & Plan Note (Signed)
BP at goal with amlodipine and losartan ?BP Readings from Last 3 Encounters:  ?11/17/21 110/60  ?07/31/21 124/76  ?06/20/21 129/86  ? ?Maintain med doses ?Refills sent ?Check BMP ?F/up in 6month ?

## 2021-11-18 ENCOUNTER — Telehealth: Payer: Self-pay | Admitting: Nurse Practitioner

## 2021-11-18 NOTE — Telephone Encounter (Signed)
Pt stated her 2 new meds were not received by her [facility]. She also stated she is in pain. ?

## 2021-11-23 ENCOUNTER — Other Ambulatory Visit (INDEPENDENT_AMBULATORY_CARE_PROVIDER_SITE_OTHER): Payer: BC Managed Care – PPO

## 2021-11-23 ENCOUNTER — Encounter: Payer: Self-pay | Admitting: Oncology

## 2021-11-23 DIAGNOSIS — D649 Anemia, unspecified: Secondary | ICD-10-CM | POA: Diagnosis not present

## 2021-11-23 LAB — CBC WITH DIFFERENTIAL/PLATELET
Basophils Absolute: 0 10*3/uL (ref 0.0–0.1)
Basophils Relative: 0.5 % (ref 0.0–3.0)
Eosinophils Absolute: 0 10*3/uL (ref 0.0–0.7)
Eosinophils Relative: 0.7 % (ref 0.0–5.0)
HCT: 37.2 % (ref 36.0–46.0)
Hemoglobin: 12.4 g/dL (ref 12.0–15.0)
Lymphocytes Relative: 23.2 % (ref 12.0–46.0)
Lymphs Abs: 1.4 10*3/uL (ref 0.7–4.0)
MCHC: 33.3 g/dL (ref 30.0–36.0)
MCV: 95.4 fl (ref 78.0–100.0)
Monocytes Absolute: 0.6 10*3/uL (ref 0.1–1.0)
Monocytes Relative: 9.4 % (ref 3.0–12.0)
Neutro Abs: 4 10*3/uL (ref 1.4–7.7)
Neutrophils Relative %: 66.2 % (ref 43.0–77.0)
Platelets: 299 10*3/uL (ref 150.0–400.0)
RBC: 3.9 Mil/uL (ref 3.87–5.11)
RDW: 13.4 % (ref 11.5–15.5)
WBC: 6.1 10*3/uL (ref 4.0–10.5)

## 2021-11-23 LAB — BASIC METABOLIC PANEL
BUN: 19 mg/dL (ref 6–23)
CO2: 27 mEq/L (ref 19–32)
Calcium: 9.9 mg/dL (ref 8.4–10.5)
Chloride: 103 mEq/L (ref 96–112)
Creatinine, Ser: 0.66 mg/dL (ref 0.40–1.20)
GFR: 97.48 mL/min (ref >60.00)
Glucose, Bld: 89 mg/dL (ref 70–99)
Potassium: 3.3 mEq/L — ABNORMAL LOW (ref 3.5–5.1)
Sodium: 139 mEq/L (ref 135–145)

## 2021-11-23 LAB — LIPID PANEL
Cholesterol: 289 mg/dL — ABNORMAL HIGH (ref 0–200)
HDL: 61.7 mg/dL (ref >39.00)
LDL Cholesterol: 205 mg/dL — ABNORMAL HIGH (ref 0–99)
NonHDL: 227.4
Total CHOL/HDL Ratio: 5
Triglycerides: 112 mg/dL (ref 0.0–149.0)
VLDL: 22.4 mg/dL (ref 0.0–40.0)

## 2021-11-23 NOTE — Progress Notes (Signed)
Per the orders of  Wilfred Lacy NP pt is here for labs, pt tolerated draw well. ? ?

## 2021-11-24 LAB — IRON,TIBC AND FERRITIN PANEL
%SAT: 16 % (calc) (ref 16–45)
Ferritin: 134 ng/mL (ref 16–232)
Iron: 63 ug/dL (ref 45–160)
TIBC: 388 mcg/dL (calc) (ref 250–450)

## 2021-11-24 NOTE — Telephone Encounter (Signed)
LVM informing patient to call our office.  ?Medications were sent to local pharmacy so that she could get them faster.  ?

## 2021-11-25 MED ORDER — POTASSIUM CHLORIDE CRYS ER 20 MEQ PO TBCR: 20.0000 meq | EXTENDED_RELEASE_TABLET | Freq: Two times a day (BID) | ORAL | 0 refills | Status: DC

## 2021-11-25 NOTE — Addendum Note (Signed)
Addended by: Leana Gamer on: 11/25/2021 03:26 PM ? ? Modules accepted: Orders ? ?

## 2021-11-30 ENCOUNTER — Telehealth: Payer: Self-pay

## 2021-11-30 DIAGNOSIS — M542 Cervicalgia: Secondary | ICD-10-CM

## 2021-11-30 NOTE — Telephone Encounter (Signed)
Pt called requesting a different RX for neck pain. The naproxen and flexeril didn't help. ?I advised she would probably need a office visit. ?She didn't understand why, since it for the same issue, asked to check with Baldo Ash ?

## 2021-12-01 NOTE — Telephone Encounter (Signed)
Please advise 

## 2021-12-01 NOTE — Telephone Encounter (Signed)
LVM for patient to return call. 

## 2021-12-03 ENCOUNTER — Ambulatory Visit (INDEPENDENT_AMBULATORY_CARE_PROVIDER_SITE_OTHER): Payer: BC Managed Care – PPO

## 2021-12-03 ENCOUNTER — Other Ambulatory Visit: Payer: BC Managed Care – PPO

## 2021-12-03 DIAGNOSIS — M542 Cervicalgia: Secondary | ICD-10-CM | POA: Diagnosis not present

## 2021-12-03 NOTE — Telephone Encounter (Signed)
Pt advised of below. Coming in today 5/11 2p for xray. ?

## 2021-12-06 MED ORDER — PREDNISONE 10 MG (21) PO TBPK: ORAL_TABLET | ORAL | 0 refills | Status: DC

## 2021-12-07 ENCOUNTER — Inpatient Hospital Stay: Payer: BC Managed Care – PPO | Admitting: Hematology and Oncology

## 2021-12-07 ENCOUNTER — Telehealth: Payer: Self-pay | Admitting: Hematology and Oncology

## 2021-12-07 NOTE — Telephone Encounter (Signed)
Contacted patient to rescheduled appointment today for iruku. Left message for patient to call me back to reschedule appointment.  ?

## 2021-12-10 ENCOUNTER — Other Ambulatory Visit: Payer: BC Managed Care – PPO

## 2021-12-11 ENCOUNTER — Telehealth: Payer: Self-pay | Admitting: Hematology and Oncology

## 2021-12-11 NOTE — Telephone Encounter (Signed)
.  Called patient to schedule appointment per 5/19 inbasket, patient is aware of date and time.   

## 2021-12-14 ENCOUNTER — Telehealth: Payer: Self-pay | Admitting: Nurse Practitioner

## 2021-12-14 NOTE — Telephone Encounter (Signed)
Pt would like call back regarding what to do now that her 5 days prednisone is finished.

## 2021-12-15 NOTE — Telephone Encounter (Signed)
LVM encouraging patient to return call to address if she is still having issues since finishing medication.

## 2021-12-16 NOTE — Telephone Encounter (Signed)
Patient states she still has pain during the night but the medication did help and the pain is not as bad as it was. She has more questions regarding this neck pain and would like a appointment to discuss treatment/management options. Appointment scheduled.

## 2021-12-18 ENCOUNTER — Ambulatory Visit: Payer: BC Managed Care – PPO | Admitting: Nurse Practitioner

## 2021-12-28 ENCOUNTER — Other Ambulatory Visit: Payer: BC Managed Care – PPO

## 2022-01-01 ENCOUNTER — Other Ambulatory Visit: Payer: Self-pay | Admitting: Family Medicine

## 2022-01-01 DIAGNOSIS — I10 Essential (primary) hypertension: Secondary | ICD-10-CM

## 2022-01-12 ENCOUNTER — Ambulatory Visit
Admission: RE | Admit: 2022-01-12 | Discharge: 2022-01-12 | Disposition: A | Discharge status: Home / Self Care | Payer: BC Managed Care – PPO | Source: Ambulatory Visit | Attending: Hematology and Oncology | Admitting: Hematology and Oncology

## 2022-01-12 DIAGNOSIS — M8588 Other specified disorders of bone density and structure, other site: Secondary | ICD-10-CM | POA: Diagnosis not present

## 2022-01-12 DIAGNOSIS — Z780 Asymptomatic menopausal state: Secondary | ICD-10-CM | POA: Diagnosis not present

## 2022-01-12 DIAGNOSIS — Z13820 Encounter for screening for osteoporosis: Secondary | ICD-10-CM

## 2022-01-15 ENCOUNTER — Other Ambulatory Visit: Payer: Self-pay

## 2022-01-15 ENCOUNTER — Inpatient Hospital Stay: Payer: BC Managed Care – PPO | Attending: Hematology and Oncology | Admitting: Hematology and Oncology

## 2022-01-15 VITALS — BP 124/77 | HR 60 | Temp 98.2°F | Resp 16 | Ht 62.0 in | Wt 153.0 lb

## 2022-01-15 DIAGNOSIS — Z79811 Long term (current) use of aromatase inhibitors: Secondary | ICD-10-CM | POA: Insufficient documentation

## 2022-01-15 DIAGNOSIS — M8580 Other specified disorders of bone density and structure, unspecified site: Secondary | ICD-10-CM | POA: Insufficient documentation

## 2022-01-15 DIAGNOSIS — Z9012 Acquired absence of left breast and nipple: Secondary | ICD-10-CM | POA: Diagnosis not present

## 2022-01-15 DIAGNOSIS — C50412 Malignant neoplasm of upper-outer quadrant of left female breast: Secondary | ICD-10-CM | POA: Insufficient documentation

## 2022-01-15 DIAGNOSIS — Z170 Estrogen receptor positive status [ER+]: Secondary | ICD-10-CM | POA: Insufficient documentation

## 2022-01-15 DIAGNOSIS — Z853 Personal history of malignant neoplasm of breast: Secondary | ICD-10-CM | POA: Diagnosis not present

## 2022-01-15 DIAGNOSIS — Z79899 Other long term (current) drug therapy: Secondary | ICD-10-CM | POA: Insufficient documentation

## 2022-01-19 ENCOUNTER — Telehealth: Payer: Self-pay | Admitting: Nurse Practitioner

## 2022-01-19 NOTE — Telephone Encounter (Signed)
Left VM, adv pt to call back

## 2022-01-19 NOTE — Telephone Encounter (Signed)
Pt returned your call from yesterday about her test results.

## 2022-01-27 ENCOUNTER — Encounter: Payer: Self-pay | Admitting: Family Medicine

## 2022-01-27 ENCOUNTER — Ambulatory Visit (INDEPENDENT_AMBULATORY_CARE_PROVIDER_SITE_OTHER): Payer: BC Managed Care – PPO | Admitting: Family Medicine

## 2022-01-27 VITALS — BP 106/60 | HR 54 | Temp 96.8°F | Ht 62.0 in | Wt 150.0 lb

## 2022-01-27 DIAGNOSIS — S161XXD Strain of muscle, fascia and tendon at neck level, subsequent encounter: Secondary | ICD-10-CM

## 2022-01-27 MED ORDER — PREDNISONE 10 MG (48) PO TBPK: ORAL_TABLET | ORAL | 0 refills | Status: DC

## 2022-01-27 MED ORDER — CYCLOBENZAPRINE HCL 10 MG PO TABS: 10.0000 mg | ORAL_TABLET | Freq: Every day | ORAL | 0 refills | Status: DC

## 2022-01-27 NOTE — Progress Notes (Signed)
Established Patient Office Visit  Subjective   Patient ID: Paige Foster, female    DOB: [birth year]  Age: 56 y.o. MRN: [id number]  Chief Complaint  Patient presents with   Acute Visit    C/o Rt sided shoulder blade pain & radiates up the  back of her neck x 6 months. Had X-ray done & was on prednisone  but still hasn't found relief     HPI for ongoing right anterior neck pain that moves to the back of her neck.  Feels the muscles tighten on occasion.  Seems to be worse at night when she lies down.  She is back at work.  She had a Port-A-Cath in place near the affected area but has since been removed.  She was given a 6-day Dosepak.  Admits that she did not take the pills as directed because she wanted them to last.  She had no issues taking prednisone.  She is now considered to be a breast cancer survivor.  Cervical films were reviewed.    Review of Systems  Constitutional:  Negative for chills, diaphoresis, malaise/fatigue and weight loss.  HENT: Negative.    Eyes: Negative.  Negative for blurred vision and double vision.  Cardiovascular:  Negative for chest pain.  Gastrointestinal:  Negative for abdominal pain.  Genitourinary: Negative.   Musculoskeletal:  Positive for myalgias and neck pain. Negative for falls.  Neurological:  Negative for speech change, loss of consciousness and weakness.  Psychiatric/Behavioral: Negative.        Objective:     BP 106/60 (BP Location: Right Arm, Patient Position: Sitting, Cuff Size: Small)   Pulse (!) 54   Temp (!) 96.8 F (36 C) (Temporal)   Ht '5\' 2"'$  (1.575 m)   Wt 150 lb (68 kg)   LMP 06/08/2007 Comment: Just stopped having period  SpO2 99%   BMI 27.44 kg/m    Physical Exam Constitutional:      General: She is not in acute distress.    Appearance: Normal appearance. She is not ill-appearing, toxic-appearing or diaphoretic.  HENT:     Head: Normocephalic and atraumatic.     Right Ear: External ear normal.     Left Ear:  External ear normal.  Eyes:     General: No scleral icterus.       Right eye: No discharge.        Left eye: No discharge.     Extraocular Movements: Extraocular movements intact.     Conjunctiva/sclera: Conjunctivae normal.  Neck:     Vascular: No carotid bruit.  Cardiovascular:     Rate and Rhythm: Normal rate and regular rhythm.  Pulmonary:     Effort: Pulmonary effort is normal. No respiratory distress.  Musculoskeletal:     Right shoulder: No tenderness or bony tenderness. Normal range of motion.     Left shoulder: No tenderness or bony tenderness. Normal range of motion.     Cervical back: Normal range of motion. Spasms present. No rigidity or bony tenderness. No pain with movement. Normal range of motion.  Lymphadenopathy:     Cervical: No cervical adenopathy.  Skin:    General: Skin is warm and dry.  Neurological:     Mental Status: She is alert and oriented to person, place, and time.     Motor: No weakness.  Psychiatric:        Mood and Affect: Mood normal.        Behavior: Behavior normal.  No results found for any visits on 01/27/22.    The 10-year ASCVD risk score (Arnett DK, et al., 2019) is: 3.9%    Assessment & Plan:   Problem List Items Addressed This Visit   None Visit Diagnoses     Strain of neck muscle, subsequent encounter    -  Primary   Relevant Medications   predniSONE (STERAPRED UNI-PAK 48 TAB) 10 MG (48) TBPK tablet   cyclobenzaprine (FLEXERIL) 10 MG tablet       Return Follow-up with Baldo Ash in 2 weeks..  Please follow neck exercises given.  Take prednisone for 12 days as directed.  Use Flexeril at night.  May need to pursue physical therapy.  Libby Maw, MD

## 2022-02-15 ENCOUNTER — Encounter: Payer: Self-pay | Admitting: Hematology and Oncology

## 2022-02-15 ENCOUNTER — Inpatient Hospital Stay: Payer: BC Managed Care – PPO | Attending: Hematology and Oncology | Admitting: Hematology and Oncology

## 2022-02-15 DIAGNOSIS — Z170 Estrogen receptor positive status [ER+]: Secondary | ICD-10-CM

## 2022-02-15 DIAGNOSIS — C50912 Malignant neoplasm of unspecified site of left female breast: Secondary | ICD-10-CM

## 2022-02-15 DIAGNOSIS — C50412 Malignant neoplasm of upper-outer quadrant of left female breast: Secondary | ICD-10-CM | POA: Diagnosis not present

## 2022-02-15 NOTE — Progress Notes (Signed)
[facility]  Telephone:([phone]) [phone] Fax:(336) [phone]    ID: [id number] Foster DOB: [birth year]  MR#: [id number]  SPZ#:[id number]  Patient Care Team: Anne Ng, NP as PCP - General (Internal Medicine) Magrinat, Valentino Hue, MD (Inactive) as Consulting Physician (Oncology) Emelia Loron, MD as Consulting Physician (General Surgery) Nche, Bonna Gains, NP as Nurse Practitioner (Internal Medicine) Carmela Rima, MD as Consulting Physician (Ophthalmology) Laurey Morale, MD as Consulting Physician (Cardiology) Louisa Second, MD as Consulting Physician (Plastic Surgery) Myrtie Neither Andreas Blower, MD as Consulting Physician (Gastroenterology) Shea Evans Genice Rouge [facility]) OTHER MD:    CHIEF COMPLAINT: 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 She was started on prednisone by her PCP, has noticed significant improvement. She thinks the pain is musculoskeletal.  She is however not interested in taking prednisone for too long, she is worried about the side effects. She is otherwise compliant with anastrozole.  Next mammogram due in November 2023.  No other complaints today for me  REVIEW OF SYSTEMS:   A detailed review of systems today was otherwise stable.   COVID 19 VACCINATION STATUS: Has had both Pfizer shots plus 1 booster October 2021   HISTORY OF CURRENT ILLNESS: From the original intake note:  Paige Foster has a prior history of left breast cancer, dating back to 2004. At that time she underwent a left mastectomy for stage 0 (noninvasive) breast cancer, with transverse rectus abdominis (TRAM) flap construction under Dr. Shon Hough. She also underwent a right breast reduction. She took tamoxifen for three years.  More recently she underwent bilateral diagnostic mammography with tomography and left breast  ultrasonography at [facility] on 01/03/2018 showing: Breast Density Category B. There is an oval fat containing lesion in the left breast upper outer quadrant posterior depth. No other significant masses, calcifications, or other findings are seen in either breast. Sonographically, there is a 1.5 cm lesion in the left breast upper outer quadrant posterior depth. This lesion is of mixed echogenicity. This correlates as palpated and with mammography findings. Follow up was recommended.  Close follow-up was suggested.  She then presented with a non-tender mass in the left reconstructed breast on 08/30/2018. On physical exam, there is a hard palpable lump measuring 2.0 cm in the upper outer left reconstructed breast 10 cm from the expected location of a nipple. Sonography over this area demonstrates a 1.8 cm x 1.7 cm x 1.4 cm mass in the left breast at 2 o'clock posterior depth 10 cm from the nipple. This mass is of mixed echogenicity. This abnormality is increased in size and correlates as palpated and with prior mammography findings. Color flow imaging demonstrates that there is vascularity present. Elastography imaging assessment is intermediate. No significant abnormalities were seen sonographically in the left axilla.    Accordingly on 08/30/2018 she proceeded to biopsy of the left breast mass in question. The pathology from this procedure showed ([id number]): invasive ductal carcinoma, grade III. Prognostic indicators significant for: estrogen receptor, 100% positive with strong staining intensity and progesterone receptor, 0% negative. Proliferation marker Ki67 at 15%. HER2 positive (3+) by immunohistochemistry.  The patient's subsequent history is as detailed below.   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    Recurrent breast cancer,  left [facility]) oncologist-- dr Jana Hakim    dx 2004, noninvasive Stage 0 ----s/p left mastectomy w/ tram flap construction (and right breast reduction), taken Tamoxifen for 3 yrs;   08-30-2018 recurrent left cancer , Grade III,  cT1c,  ER positive, PR negative, HER-2 positive, invasive ductal carcinoma-- neoadjuvant chemo to start 09-26-2018   Renal artery stenosis (HCC)    mild right external renal artery stenosis per duplex in epic 08-09-2013   Wears glasses     PAST SURGICAL HISTORY: Past Surgical History:  Procedure Laterality Date   BREAST LUMPECTOMY WITH RADIOACTIVE SEED LOCALIZATION Left 02/20/2019   Procedure: LEFT BREAST LUMPECTOMY WITH RADIOACTIVE SEED LOCALIZATION;  Surgeon: Rolm Bookbinder, MD;  Location: [facility];  Service: General;  Laterality: Left;   BREAST SURGERY Left    Transflap   COLONOSCOPY     COLONOSCOPY     MASTECTOMY Left 2004   w/  TRAM flap construction and right breast augmentation with abdominoplasy   PARS PLANA VITRECTOMY Left 12/18/2018   Procedure: PARS PLANA VITRECTOMY WITH 25 GAUGE, ENDOLASER;  Surgeon: Jalene Mullet, MD;  Location: [place];  Service: Ophthalmology;  Laterality: Left;   PORTACATH PLACEMENT N/A 09/25/2018   Procedure: INSERTION PORT-A-CATH WITH ULTRASOUND;  Surgeon: Rolm Bookbinder, MD;  Location: [facility];  Service: General;  Laterality: N/A;   TUBAL LIGATION Bilateral yrs ago    FAMILY HISTORY: Family History  Problem Relation Age of Onset   Diabetes Mother    Hypertension Mother    Kidney disease Father    Colon cancer Neg Hx    Colon polyps Neg Hx    Gallbladder disease Neg Hx    Heart disease Neg Hx    Esophageal cancer Neg Hx    Stomach cancer Neg Hx    Rectal cancer Neg Hx   Amethyst's father died from unknown causes in his early 104's. Patients' mother died from diabetes complications at age 57. The patient has 1 sister. Patient denies anyone in her family having breast, ovarian, prostate, or pancreatic cancer.    GYNECOLOGIC  HISTORY:  Patient's last menstrual period was 06/08/2007. 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 Her husband, Elwin Mocha, works at [employer]. Kerisha has two children, Vonna Kotyk and Shanon Brow. Vonna Kotyk lives with her, is 92, and it attending [employer] for a computer based degree. Shanon Brow lives with her, is 54, and recently graduated from [employer] with a degree in Careers information officer.  He works for [employer]. Cleotha has no grandchildren. She attends the [employer].   ADVANCED DIRECTIVES: In the absence of any documents to the contrary her husband, Elwin Mocha, is automatically her healthcare power of attorney     HEALTH MAINTENANCE: Social History   Tobacco Use   Smoking status: Never   Smokeless tobacco: Never  Vaping Use   Vaping Use: Never used  Substance Use Topics   Alcohol use: Not Currently    Alcohol/week: 0.0 standard drinks of alcohol    Comment: Occassionally   Drug use: No    Colonoscopy: April 2022, danis  PAP: January 2022, Nche  Bone density:  2017; -1.6, osteopenic   No Known Allergies  Current Outpatient Medications  Medication Sig Dispense Refill   amLODipine (NORVASC) 10 MG tablet TAKE 1 TABLET(10  MG) BY MOUTH DAILY 90 tablet 3   anastrozole (ARIMIDEX) 1 MG tablet Take 1 tablet (1 mg total) by mouth daily. 90 tablet 4   cyclobenzaprine (FLEXERIL) 10 MG tablet Take 1 tablet (10 mg total) by mouth at bedtime. 30 tablet 0   losartan (COZAAR) 50 MG tablet Take 1 tablet (50 mg total) by mouth daily. 90 tablet 3   prednisoLONE acetate (PRED FORTE) 1 % ophthalmic suspension Place 1 drop into the left eye 4 (four) times daily. (Patient not taking: Reported on 01/27/2022)     predniSONE (STERAPRED UNI-PAK 48 TAB) 10 MG (48) TBPK tablet Please instruct a 12-day Dosepak. 48 tablet 0   No current  facility-administered medications for this visit.     OBJECTIVE: African-American woman who appears stated age  There were no vitals filed for this visit.     There is no height or weight on file to calculate BMI.   Wt Readings from Last 3 Encounters:  01/27/22 150 lb (68 kg)  01/15/22 153 lb (69.4 kg)  11/17/21 149 lb 6.4 oz (67.8 kg)    Physical examination not done, telephone visit   LAB RESULTS:  CMP     Component Value Date/Time   NA 139 11/23/2021 0903   NA 140 10/12/2017 0950   K 3.3 (L) 11/23/2021 0903   CL 103 11/23/2021 0903   CO2 27 11/23/2021 0903   GLUCOSE 89 11/23/2021 0903   BUN 19 11/23/2021 0903   BUN 10 10/12/2017 0950   CREATININE 0.66 11/23/2021 0903   CREATININE 0.78 09/07/2018 1455   CALCIUM 9.9 11/23/2021 0903   PROT 7.7 06/02/2021 1046   PROT 7.5 10/12/2017 0950   ALBUMIN 4.1 06/02/2021 1046   ALBUMIN 4.3 10/12/2017 0950   AST 17 06/02/2021 1046   AST 14 (L) 09/07/2018 1455   ALT 13 06/02/2021 1046   ALT 13 09/07/2018 1455   ALKPHOS 100 06/02/2021 1046   BILITOT 0.3 06/02/2021 1046   BILITOT 0.2 (L) 09/07/2018 1455   GFRNONAA >60 06/02/2021 1046   GFRNONAA >60 09/07/2018 1455   GFRAA >60 12/06/2019 1441   GFRAA >60 09/07/2018 1455   Lab Results  Component Value Date   WBC 6.1 11/23/2021   NEUTROABS 4.0 11/23/2021   HGB 12.4 11/23/2021   HCT 37.2 11/23/2021   MCV 95.4 11/23/2021   PLT 299.0 11/23/2021    Lab Results  Component Value Date   LABCA2 <4 08/30/2007    No components found for: "TTCNGF943"  No results for input(s): "INR" in the last 168 hours.  Lab Results  Component Value Date   LABCA2 <4 08/30/2007    No results found for: "CAN199"  No results found for: "CAN125"  No results found for: "CAN153"  No results found for: "CA2729"  No components found for: "HGQUANT"  No results found for: "CEA1", "CEA" / No results found for: "CEA1", "CEA"  No results found for: "AFPTUMOR"  No results found for:  "CHROMOGRNA"  No results found for: "TOTALPROTELP", "ALBUMINELP", "A1GS", "A2GS", "BETS", "BETA2SER", "GAMS", "MSPIKE", "SPEI" (this displays SPEP labs)  No results found for: "KPAFRELGTCHN", "LAMBDASER", "KAPLAMBRATIO" (kappa/lambda light chains)  No results found for: "HGBA", "HGBA2QUANT", "HGBFQUANT", "HGBSQUAN" (Hemoglobinopathy evaluation)   Lab Results  Component Value Date   LDH 169 08/30/2007    Lab Results  Component Value Date   IRON 63 11/23/2021   TIBC 388 11/23/2021   IRONPCTSAT 16 11/23/2021   (Iron and TIBC)  Lab Results  Component Value Date  FERRITIN 134 11/23/2021    Urinalysis    Component Value Date/Time   LABSPEC 1.015 04/09/2008 1548   PHURINE 7.0 04/09/2008 1548   HGBUR large 04/09/2008 1548   BILIRUBINUR negative 04/09/2008 1548   UROBILINOGEN 0.2 04/09/2008 1548   NITRITE negative 04/09/2008 1548    STUDIES:  No results found.   ELIGIBLE FOR AVAILABLE RESEARCH PROTOCOL: No  ASSESSMENT: 56 y.o. , [place] woman  (1) history of left-sided ductal carcinoma in situ 2004  (a) s/p left mastectomy with TRAM reconstruction  (b) status post tamoxifen x3 years  (2) left breast upper outer quadrant biopsy 08/30/2018 shows a clinical T1c N0 invasive ductal carcinoma, grade 3, estrogen receptor strongly positive, progesterone receptor negative, with HER-2 amplification, and and MIB-1 of 15%.   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    (b) chemotherapy discontinued after 4 cycles because of intercurrent eye surgery  (c) continued trastuzumab and pertuzumab to complete a year (last dose 09/06/2019)  (d) echocardiogram  on 01/23/2019 that shows well preserved EF of 60-65%  (e) echocardiogram on 04/23/2019 shows EF of 60-65%  (f) echocardiogram 08/02/2019 shows an ejection fraction in the 60-65% range  (4) left lumpectomy 02/20/2019 showed a residual  ypT1c NX invasive ductal carcinoma, grade 2 with negative margins.    (5) adjuvant radiation: Radiation Treatment Dates: 04/12/2019 through 05/28/2019 Site Technique Total Dose (Gy) Dose per Fx (Gy) Completed Fx Beam Energies  Breast: CW_Lt 3D 50.4/50.4 1.8 28/28 6X, 10X  Breast: CW_Lt_SCV_PAB 3D 50.4/50.4 1.8 28/28 6X, 10X  Breast: CW_Lt_Bst Electron 10/10 2 5/5 6X, 10X   (6) anastrozole started 06/26/2019  (a) D[facility] scan at [facility] 12/24/2015 found a T score of -1.6 DEXA scan from mass June 2023 showed T score of -1.4 at L1-L2  (7) genetics testing 02/03/2019 through the Common Hereditary Cancers Panel offered by [facility] found no deleterious mutations in APC, ATM, AXIN2, BARD1, BMPR1A, BRCA1, BRCA2, BRIP1, CDH1, CDKN2A (p14ARF), CDKN2A (p16INK4a), CKD4, CHEK2, CTNNA1, DICER1, EPCAM (Deletion/duplication testing only), GREM1 (promoter region deletion/duplication testing only), KIT, MEN1, MLH1, MSH2, MSH3, MSH6, MUTYH, NBN, NF1, NHTL1, PALB2, PDGFRA, PMS2, POLD1, POLE, PTEN, RAD50, RAD51C, RAD51D, RNF43, SDHB, SDHC, SDHD, SMAD4, SMARCA4. STK11, TP53, TSC1, TSC2, and VHL.  The following genes were evaluated for sequence changes only: SDHA and HOXB13 c.251G>A variant only.   PLAN:  Patient is here for telephone follow-up regarding the neck pain. With regards to the neck pain, she was prescribed prednisone by her PCP.  Since she started the prednisone, she has noticed remarkable improvement.  She denies any other complaints.  She continues to take anastrozole regularly as prescribed.  She was not interested in pursuing bisphosphonates for osteopenia during her last visit.   She would like to do mammogram at the same place she has done.  We will send a requisition for  mammogram in November 2023 . She will return to clinic in person in 1 year. She was encouraged to contact us with any new questions or concerns  I connected with  Charlott Holler on 02/15/22 by a telephone application and verified that I am speaking with the correct person using two identifiers.   I discussed the limitations of evaluation and management by  telemedicine. The patient expressed understanding and agreed to proceed.  Benay Pike, MD  Medical Oncology and Hematology [facility] [place], [place] [address] Tel. [phone]    Fax. [phone]  Total time spent, 15 minutes  *Total Encounter Time as defined by the Centers for Medicare and Medicaid Services includes, in addition to the face-to-face time of a patient visit (documented in the note above) non-face-to-face time: obtaining and reviewing outside history, ordering and reviewing medications, tests or procedures, care coordination (communications with other health care professionals or caregivers) and documentation in the medical record.

## 2022-03-02 ENCOUNTER — Other Ambulatory Visit: Payer: Self-pay | Admitting: Nurse Practitioner

## 2022-03-02 DIAGNOSIS — I10 Essential (primary) hypertension: Secondary | ICD-10-CM

## 2022-03-02 NOTE — Telephone Encounter (Signed)
Chart supports Rx Last OV: 01/2022 Next OV:  not scheduled

## 2022-03-24 ENCOUNTER — Telehealth: Payer: Self-pay | Admitting: Nurse Practitioner

## 2022-03-24 DIAGNOSIS — Z79811 Long term (current) use of aromatase inhibitors: Secondary | ICD-10-CM

## 2022-03-24 DIAGNOSIS — M8580 Other specified disorders of bone density and structure, unspecified site: Secondary | ICD-10-CM

## 2022-03-24 NOTE — Telephone Encounter (Signed)
Caller Name: Scottlynn Lindell Call back phone #: [phone]  Reason for Call: Pt was given the option of taking a prescription for her bones, she has questions and concerns. Please call to speak with her about this

## 2022-03-25 NOTE — Telephone Encounter (Signed)
Called & spoke w/ pt, says she received the letter in the mail from her previous bone density results. Says she didn't understand why you were asking her about taking fosamax. I explained to pt, with her recent osteopenia diagnosis, that medication was recommended and we wanted to know if you were okay with taking it. Pt understood and she is willing to start the fosamax prescription. Pt would like for it to be sent to [facility]

## 2022-03-26 MED ORDER — ALENDRONATE SODIUM 70 MG PO TABS: 70.0000 mg | ORAL_TABLET | ORAL | 3 refills | Status: DC

## 2022-03-26 NOTE — Telephone Encounter (Signed)
Pt advised.

## 2022-03-26 NOTE — Telephone Encounter (Signed)
Also start cakcium '600mg'$  and vit. D 1000Iu daily OTC

## 2022-05-24 DIAGNOSIS — H401131 Primary open-angle glaucoma, bilateral, mild stage: Secondary | ICD-10-CM | POA: Diagnosis not present

## 2022-06-01 ENCOUNTER — Other Ambulatory Visit: Payer: Self-pay | Admitting: *Deleted

## 2022-06-01 DIAGNOSIS — C50912 Malignant neoplasm of unspecified site of left female breast: Secondary | ICD-10-CM

## 2022-06-01 DIAGNOSIS — Z170 Estrogen receptor positive status [ER+]: Secondary | ICD-10-CM

## 2022-06-02 ENCOUNTER — Inpatient Hospital Stay: Payer: BC Managed Care – PPO | Attending: Hematology and Oncology

## 2022-06-02 ENCOUNTER — Other Ambulatory Visit: Payer: Self-pay

## 2022-06-02 ENCOUNTER — Inpatient Hospital Stay (HOSPITAL_BASED_OUTPATIENT_CLINIC_OR_DEPARTMENT_OTHER): Payer: BC Managed Care – PPO | Admitting: Hematology and Oncology

## 2022-06-02 ENCOUNTER — Inpatient Hospital Stay: Payer: BC Managed Care – PPO | Admitting: Hematology and Oncology

## 2022-06-02 VITALS — BP 134/84 | HR 75 | Temp 97.9°F | Resp 16 | Ht 62.0 in | Wt 154.2 lb

## 2022-06-02 DIAGNOSIS — Z170 Estrogen receptor positive status [ER+]: Secondary | ICD-10-CM | POA: Insufficient documentation

## 2022-06-02 DIAGNOSIS — Z79811 Long term (current) use of aromatase inhibitors: Secondary | ICD-10-CM | POA: Diagnosis not present

## 2022-06-02 DIAGNOSIS — C50912 Malignant neoplasm of unspecified site of left female breast: Secondary | ICD-10-CM

## 2022-06-02 DIAGNOSIS — Z853 Personal history of malignant neoplasm of breast: Secondary | ICD-10-CM | POA: Diagnosis not present

## 2022-06-02 DIAGNOSIS — M8580 Other specified disorders of bone density and structure, unspecified site: Secondary | ICD-10-CM | POA: Diagnosis not present

## 2022-06-02 DIAGNOSIS — C50412 Malignant neoplasm of upper-outer quadrant of left female breast: Secondary | ICD-10-CM | POA: Diagnosis not present

## 2022-06-02 DIAGNOSIS — Z9012 Acquired absence of left breast and nipple: Secondary | ICD-10-CM | POA: Diagnosis not present

## 2022-06-02 DIAGNOSIS — Z79899 Other long term (current) drug therapy: Secondary | ICD-10-CM | POA: Diagnosis not present

## 2022-06-02 LAB — CBC WITH DIFFERENTIAL (CANCER CENTER ONLY)
Abs Immature Granulocytes: 0.01 10*3/uL (ref 0.00–0.07)
Basophils Absolute: 0 10*3/uL (ref 0.0–0.1)
Basophils Relative: 1 %
Eosinophils Absolute: 0.1 10*3/uL (ref 0.0–0.5)
Eosinophils Relative: 1 %
HCT: 37.1 % (ref 36.0–46.0)
Hemoglobin: 12.7 g/dL (ref 12.0–15.0)
Immature Granulocytes: 0 %
Lymphocytes Relative: 32 %
Lymphs Abs: 1.5 10*3/uL (ref 0.7–4.0)
MCH: 32.5 pg (ref 26.0–34.0)
MCHC: 34.2 g/dL (ref 30.0–36.0)
MCV: 94.9 fL (ref 80.0–100.0)
Monocytes Absolute: 0.4 10*3/uL (ref 0.1–1.0)
Monocytes Relative: 9 %
Neutro Abs: 2.7 10*3/uL (ref 1.7–7.7)
Neutrophils Relative %: 57 %
Platelet Count: 295 10*3/uL (ref 150–400)
RBC: 3.91 MIL/uL (ref 3.87–5.11)
RDW: 12.1 % (ref 11.5–15.5)
WBC Count: 4.7 10*3/uL (ref 4.0–10.5)
nRBC: 0 % (ref 0.0–0.2)

## 2022-06-02 LAB — CMP (CANCER CENTER ONLY)
ALT: 11 U/L (ref 0–44)
AST: 15 U/L (ref 15–41)
Albumin: 4.3 g/dL (ref 3.5–5.0)
Alkaline Phosphatase: 98 U/L (ref 38–126)
Anion gap: 7 (ref 5–15)
BUN: 15 mg/dL (ref 6–20)
CO2: 27 mmol/L (ref 22–32)
Calcium: 9.6 mg/dL (ref 8.9–10.3)
Chloride: 105 mmol/L (ref 98–111)
Creatinine: 0.66 mg/dL (ref 0.44–1.00)
GFR, Estimated: >60 mL/min (ref >60)
Glucose, Bld: 97 mg/dL (ref 70–99)
Potassium: 3.9 mmol/L (ref 3.5–5.1)
Sodium: 139 mmol/L (ref 135–145)
Total Bilirubin: 0.3 mg/dL (ref 0.3–1.2)
Total Protein: 7.4 g/dL (ref 6.5–8.1)

## 2022-06-02 LAB — IRON AND IRON BINDING CAPACITY (CC-WL,HP ONLY)
Iron: 56 ug/dL (ref 28–170)
Saturation Ratios: 13 % (ref 10.4–31.8)
TIBC: 431 ug/dL (ref 250–450)
UIBC: 375 ug/dL (ref 148–442)

## 2022-06-02 LAB — FERRITIN: Ferritin: 98 ng/mL (ref 11–307)

## 2022-06-02 NOTE — Progress Notes (Signed)
[place]  [phone] Fax:([phone]) [phone]    ID: Paige Foster DOB: [birth year]  MR#: [id number]  CHE#:[id number]  Patient Care Team: Benay Pike, MD as PCP - General (Hematology and Oncology) Magrinat, Virgie Dad, MD (Inactive) as Consulting Physician (Oncology) Rolm Bookbinder, MD as Consulting Physician (General Surgery) Nche, Charlene Brooke, NP as Nurse Practitioner (Internal Medicine) Jalene Mullet, MD as Consulting Physician (Ophthalmology) Larey Dresser, MD as Consulting Physician (Cardiology) Cristine Polio, MD (Inactive) as Consulting Physician (Plastic Surgery) Doran Stabler, MD as Consulting Physician (Gastroenterology) Idolina Primer [facility]) OTHER MD:    CHIEF COMPLAINT: 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  Since last visit, she tells me that she had a rib fracture in the left side, recently had x-rays.  She was recovering well until she lifted something heavy while giving her dog a bath and started having the pain again.  She otherwise denies any changes in the breast.  She is due for mammogram but has not scheduled this yet.  She is taking all her medications as prescribed.  Rest of the pertinent 10 point ROS reviewed and negative   HISTORY OF CURRENT ILLNESS: From the original intake note:  Paige Foster has a prior history of left breast cancer, dating back to 2004. At that time she underwent a left mastectomy for stage 0 (noninvasive) breast cancer, with transverse rectus abdominis (TRAM) flap construction under Dr. Towanda Malkin. She also underwent a right breast reduction. She took tamoxifen for three years.  More recently she underwent bilateral diagnostic mammography with tomography and left breast ultrasonography at [facility] on 01/03/2018 showing: Breast Density Category B. There is an oval  fat containing lesion in the left breast upper outer quadrant posterior depth. No other significant masses, calcifications, or other findings are seen in either breast. Sonographically, there is a 1.5 cm lesion in the left breast upper outer quadrant posterior depth. This lesion is of mixed echogenicity. This correlates as palpated and with mammography findings. Follow up was recommended.  Close follow-up was suggested.  She then presented with a non-tender mass in the left reconstructed breast on 08/30/2018. On physical exam, there is a hard palpable lump measuring 2.0 cm in the upper outer left reconstructed breast 10 cm from the expected location of a nipple. Sonography over this area demonstrates a 1.8 cm x 1.7 cm x 1.4 cm mass in the left breast at 2 o'clock posterior depth 10 cm from the nipple. This mass is of mixed echogenicity. This abnormality is increased in size and correlates as palpated and with prior mammography findings. Color flow imaging demonstrates that there is vascularity present. Elastography imaging assessment is intermediate. No significant abnormalities were seen sonographically in the left axilla.    Accordingly on 08/30/2018 she proceeded to biopsy of the left breast mass in question. The pathology from this procedure showed ([id number]): invasive ductal carcinoma, grade III. Prognostic indicators significant for: estrogen receptor, 100% positive with strong staining intensity and progesterone receptor, 0% negative. Proliferation marker Ki67 at 15%. HER2 positive (3+) by immunohistochemistry.  The patient's subsequent history is as detailed below.   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  Recurrent breast cancer, left [facility]) oncologist-- dr Jana Hakim    dx 2004, noninvasive Stage 0 ----s/p left mastectomy  w/ tram flap construction (and right breast reduction), taken Tamoxifen for 3 yrs;   08-30-2018 recurrent left cancer , Grade III,  cT1c,  ER positive, PR negative, HER-2 positive, invasive ductal carcinoma-- neoadjuvant chemo to start 09-26-2018   Renal artery stenosis (HCC)    mild right external renal artery stenosis per duplex in epic 08-09-2013   Wears glasses     PAST SURGICAL HISTORY: Past Surgical History:  Procedure Laterality Date   BREAST LUMPECTOMY WITH RADIOACTIVE SEED LOCALIZATION Left 02/20/2019   Procedure: LEFT BREAST LUMPECTOMY WITH RADIOACTIVE SEED LOCALIZATION;  Surgeon: Rolm Bookbinder, MD;  Location: [facility];  Service: General;  Laterality: Left;   BREAST SURGERY Left    Transflap   COLONOSCOPY     COLONOSCOPY     MASTECTOMY Left 2004   w/  TRAM flap construction and right breast augmentation with abdominoplasy   PARS PLANA VITRECTOMY Left 12/18/2018   Procedure: PARS PLANA VITRECTOMY WITH 25 GAUGE, ENDOLASER;  Surgeon: Jalene Mullet, MD;  Location: [facility];  Service: Ophthalmology;  Laterality: Left;   PORTACATH PLACEMENT N/A 09/25/2018   Procedure: INSERTION PORT-A-CATH WITH ULTRASOUND;  Surgeon: Rolm Bookbinder, MD;  Location: [facility];  Service: General;  Laterality: N/A;   TUBAL LIGATION Bilateral yrs ago    FAMILY HISTORY: Family History  Problem Relation Age of Onset   Diabetes Mother    Hypertension Mother    Kidney disease Father    Colon cancer Neg Hx    Colon polyps Neg Hx    Gallbladder disease Neg Hx    Heart disease Neg Hx    Esophageal cancer Neg Hx    Stomach cancer Neg Hx    Rectal cancer Neg Hx   Bruna's father died from unknown causes in his early 34's. Patients' mother died from diabetes complications at age 56. The patient has 1 sister. Patient denies anyone in her family having breast, ovarian, prostate, or pancreatic cancer.    GYNECOLOGIC HISTORY:  Patient's last menstrual period was 06/08/2007. 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 Her husband, Elwin Mocha, works at [employer]. Anyeli has two children, Vonna Kotyk and Shanon Brow. Vonna Kotyk lives with her, is 31, and it attending [employer] for a computer based degree. Shanon Brow lives with her, is 53, and recently graduated from [employer] with a degree in Careers information officer.  He works for [employer]. Monie has no grandchildren. She attends the [employer].   ADVANCED DIRECTIVES: In the absence of any documents to the contrary her husband, Elwin Mocha, is automatically her healthcare power of attorney     HEALTH MAINTENANCE: Social History   Tobacco Use   Smoking status: Never   Smokeless tobacco: Never  Vaping Use   Vaping Use: Never used  Substance Use Topics   Alcohol use: Not Currently    Alcohol/week: 0.0 standard drinks of alcohol    Comment: Occassionally   Drug use: No    Colonoscopy: April 2022, danis  PAP: January 2022, Nche  Bone density:  2017; -1.6, osteopenic   No Known Allergies  Current Outpatient Medications  Medication Sig Dispense Refill   alendronate (FOSAMAX) 70 MG tablet Take 1 tablet (70 mg total) by mouth every 7 (  seven) days. Take with a full glass of water on an empty stomach. 12 tablet 3   amLODipine (NORVASC) 10 MG tablet TAKE 1 TABLET(10 MG) BY MOUTH DAILY 90 tablet 3   anastrozole (ARIMIDEX) 1 MG tablet Take 1 tablet (1 mg total) by mouth daily. 90 tablet 4   cyclobenzaprine (FLEXERIL) 10 MG tablet Take 1 tablet (10 mg total) by mouth at bedtime. 30 tablet 0   losartan (COZAAR) 50 MG tablet TAKE 1 TABLET(50 MG) BY MOUTH DAILY 90 tablet 3   prednisoLONE acetate (PRED FORTE) 1 % ophthalmic suspension Place 1 drop into the left eye 4 (four) times daily. (Patient not taking: Reported on 01/27/2022)     predniSONE (STERAPRED UNI-PAK 48 TAB) 10 MG (48) TBPK tablet Please instruct a  12-day Dosepak. 48 tablet 0   No current facility-administered medications for this visit.     OBJECTIVE: African-American woman who appears stated age  56:   06/02/22 1148  BP: 134/84  Pulse: 75  Resp: 16  Temp: 97.9 F (36.6 C)  SpO2: 98%      Body mass index is 28.2 kg/m.   Wt Readings from Last 3 Encounters:  06/02/22 154 lb 3.2 oz (69.9 kg)  01/27/22 150 lb (68 kg)  01/15/22 153 lb (69.4 kg)    Physical examination not done, telephone visit   LAB RESULTS:  CMP     Component Value Date/Time   NA 139 06/02/2022 1105   NA 140 10/12/2017 0950   K 3.9 06/02/2022 1105   CL 105 06/02/2022 1105   CO2 27 06/02/2022 1105   GLUCOSE 97 06/02/2022 1105   BUN 15 06/02/2022 1105   BUN 10 10/12/2017 0950   CREATININE 0.66 06/02/2022 1105   CALCIUM 9.6 06/02/2022 1105   PROT 7.4 06/02/2022 1105   PROT 7.5 10/12/2017 0950   ALBUMIN 4.3 06/02/2022 1105   ALBUMIN 4.3 10/12/2017 0950   AST 15 06/02/2022 1105   ALT 11 06/02/2022 1105   ALKPHOS 98 06/02/2022 1105   BILITOT 0.3 06/02/2022 1105   GFRNONAA >60 06/02/2022 1105   GFRAA >60 12/06/2019 1441   GFRAA >60 09/07/2018 1455   Lab Results  Component Value Date   WBC 4.7 06/02/2022   NEUTROABS 2.7 06/02/2022   HGB 12.7 06/02/2022   HCT 37.1 06/02/2022   MCV 94.9 06/02/2022   PLT 295 06/02/2022    Lab Results  Component Value Date   LABCA2 <4 08/30/2007    No components found for: "WIOXBD532"  No results for input(s): "INR" in the last 168 hours.  Lab Results  Component Value Date   LABCA2 <4 08/30/2007    No results found for: "CAN199"  No results found for: "CAN125"  No results found for: "CAN153"  No results found for: "CA2729"  No components found for: "HGQUANT"  No results found for: "CEA1", "CEA" / No results found for: "CEA1", "CEA"  No results found for: "AFPTUMOR"  No results found for: "CHROMOGRNA"  No results found for: "TOTALPROTELP", "ALBUMINELP", "A1GS", "A2GS", "BETS",  "BETA2SER", "GAMS", "MSPIKE", "SPEI" (this displays SPEP labs)  No results found for: "KPAFRELGTCHN", "LAMBDASER", "KAPLAMBRATIO" (kappa/lambda light chains)  No results found for: "HGBA", "HGBA2QUANT", "HGBFQUANT", "HGBSQUAN" (Hemoglobinopathy evaluation)   Lab Results  Component Value Date   LDH 169 08/30/2007    Lab Results  Component Value Date   IRON 63 11/23/2021   TIBC 388 11/23/2021   IRONPCTSAT 16 11/23/2021   (Iron and TIBC)  Lab Results  Component Value  Date   FERRITIN 134 11/23/2021    Urinalysis    Component Value Date/Time   LABSPEC 1.015 04/09/2008 1548   PHURINE 7.0 04/09/2008 1548   HGBUR large 04/09/2008 1548   BILIRUBINUR negative 04/09/2008 1548   UROBILINOGEN 0.2 04/09/2008 1548   NITRITE negative 04/09/2008 1548    STUDIES:  No results found.   ELIGIBLE FOR AVAILABLE RESEARCH PROTOCOL: No  ASSESSMENT: 56 y.o. Paige Foster, Paige Foster woman  (1) history of left-sided ductal carcinoma in situ 2004  (a) s/p left mastectomy with TRAM reconstruction  (b) status post tamoxifen x3 years  (2) left breast upper outer quadrant biopsy 08/30/2018 shows a clinical T1c N0 invasive ductal carcinoma, grade 3, estrogen receptor strongly positive, progesterone receptor negative, with HER-2 amplification, and and MIB-1 of 15%.   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    (b) chemotherapy discontinued after 4 cycles because of intercurrent eye surgery  (c) continued trastuzumab and pertuzumab to complete a year (last dose 09/06/2019)  (d) echocardiogram on 01/23/2019 that shows well preserved EF of 60-65%  (e) echocardiogram on 04/23/2019 shows  EF of 60-65%  (f) echocardiogram 08/02/2019 shows an ejection fraction in the 60-65% range  (4) left lumpectomy 02/20/2019 showed a residual  ypT1c NX invasive ductal carcinoma, grade 2 with negative margins.    (5) adjuvant radiation: Radiation Treatment Dates: 04/12/2019 through 05/28/2019 Site Technique Total Dose (Gy) Dose per Fx (Gy) Completed Fx Beam Energies  Breast: CW_Lt 3D 50.4/50.4 1.8 28/28 6X, 10X  Breast: CW_Lt_SCV_PAB 3D 50.4/50.4 1.8 28/28 6X, 10X  Breast: CW_Lt_Bst Electron 10/10 2 5/5 6X, 10X   (6) anastrozole started 06/26/2019  (a) DEXA scan at [facility] 12/24/2015 found a T score of -1.6 DEXA scan from mass June 2023 showed T score of -1.4 at L1-L2  (7) genetics testing 02/03/2019 through the Common Hereditary Cancers Panel offered by [facility] found no deleterious mutations in APC, ATM, AXIN2, BARD1, BMPR1A, BRCA1, BRCA2, BRIP1, CDH1, CDKN2A (p14ARF), CDKN2A (p16INK4a), CKD4, CHEK2, CTNNA1, DICER1, EPCAM (Deletion/duplication testing only), GREM1 (promoter region deletion/duplication testing only), KIT, MEN1, MLH1, MSH2, MSH3, MSH6, MUTYH, NBN, NF1, NHTL1, PALB2, PDGFRA, PMS2, POLD1, POLE, PTEN, RAD50, RAD51C, RAD51D, RNF43, SDHB, SDHC, SDHD, SMAD4, SMARCA4. STK11, TP53, TSC1, TSC2, and VHL.  The following genes were evaluated for sequence changes only: SDHA and HOXB13 c.251G>A variant only.   PLAN:  Patient is here for follow up. She denies any new changes except for some rib pain in the left breast or under the left breast.  She was diagnosed with a minimally displaced left rib fracture and she was healing well up until recently when she lifted something heavy and started noticing some pain again.  She denies any other breast changes. No concerns on physical examination today.  Her last gram in [place] was November 2022, I have encouraged her to reschedule this since she is about due.  She expressed understanding.  She will otherwise continue anastrozole. Most recent bone density  showed osteopenia hence advised vitamin D as tolerated, regular weightbearing exercises. Return to clinic in 1 year or sooner as needed.  Benay Pike, MD  Medical Oncology and Hematology [facility] [place], [place] [address] Tel. [phone]  Fax. [phone]  Total time spent, 15 minutes  *Total Encounter Time as defined by the Centers for Medicare and Medicaid Services includes, in addition to the face-to-face time of a patient visit (documented in the note above) non-face-to-face time: obtaining and reviewing outside history, ordering and reviewing medications, tests or procedures, care coordination (communications with other health care professionals or caregivers) and documentation in the medical record.

## 2022-06-02 NOTE — Progress Notes (Unsigned)
[facility]  [phone] [phone]    ID: Paige Foster DOB: [birth year]  MR#: [id number]  ZES#:[id number]  Patient Care Team: Paige Pike, [facility] as PCP - General (Hematology and Oncology) Magrinat, Virgie Dad, [facility] (Inactive) as Consulting Physician (Oncology) Paige Bookbinder, [facility] as Consulting Physician (General Surgery) Nche, Charlene Brooke, NP as Nurse Practitioner (Internal Medicine) [facility] as Consulting Physician (Ophthalmology) Paige Dresser, [facility] as Consulting Physician (Cardiology) Paige Polio, [facility] (Inactive) as Consulting Physician (Plastic Surgery) Paige Stabler, Foster as Consulting Physician (Gastroenterology) [facility]:    CHIEF COMPLAINT: 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 Last mammogram in November with no evidence of malignancy.  REVIEW OF SYSTEMS:   A detailed review of systems today was otherwise stable.   COVID 19 VACCINATION STATUS: Has had both Pfizer shots plus 1 booster October 2021   HISTORY OF CURRENT ILLNESS: From the original intake note:  Paige Foster has a prior history of left breast cancer, dating back to 2004. At that time she underwent a left mastectomy for stage 0 (noninvasive) breast cancer, with transverse rectus abdominis (TRAM) flap construction under Dr. Towanda Foster. She also underwent a right breast reduction. She took tamoxifen for three years.  More recently she underwent bilateral diagnostic mammography with tomography and left breast ultrasonography at [facility] on 01/03/2018 showing: Breast Density Category B. There is an oval fat containing lesion in the left breast upper outer quadrant posterior depth. No other significant masses, calcifications, or other findings are seen in either breast.  Sonographically, there is a 1.5 cm lesion in the left breast upper outer quadrant posterior depth. This lesion is of mixed echogenicity. This correlates as palpated and with mammography findings. Follow up was recommended.  Close follow-up was suggested.  She then presented with a non-tender mass in the left reconstructed breast on 08/30/2018. On physical exam, there is a hard palpable lump measuring 2.0 cm in the upper outer left reconstructed breast 10 cm from the expected location of a nipple. Sonography over this area demonstrates a 1.8 cm x 1.7 cm x 1.4 cm mass in the left breast at 2 o'clock posterior depth 10 cm from the nipple. This mass is of mixed echogenicity. This abnormality is increased in size and correlates as palpated and with prior mammography findings. Color flow imaging demonstrates that there is vascularity present. Elastography imaging assessment is intermediate. No significant abnormalities were seen sonographically in the left axilla.    Accordingly on 08/30/2018 she proceeded to biopsy of the left breast mass in question. The pathology from this procedure showed ([id number]): invasive ductal carcinoma, grade III. Prognostic indicators significant for: estrogen receptor, 100% positive with strong staining intensity and progesterone receptor, 0% negative. Proliferation marker Ki67 at 15%. HER2 positive (3+) by immunohistochemistry.  The patient's subsequent history is as detailed below.   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    Recurrent breast cancer, left [facility]    dx 2004, noninvasive Stage 0 ----s/p left mastectomy w/ tram flap construction (and right breast reduction), taken Tamoxifen for 3 yrs;   08-30-2018 recurrent  left cancer , Grade III,  cT1c,  ER positive, PR negative, HER-2  positive, invasive ductal carcinoma-- neoadjuvant chemo to start 09-26-2018   Renal artery stenosis (HCC)    mild right external renal artery stenosis per duplex in epic 08-09-2013   Wears glasses     PAST SURGICAL HISTORY: Past Surgical History:  Procedure Laterality Date   BREAST LUMPECTOMY WITH RADIOACTIVE SEED LOCALIZATION Left 02/20/2019   Procedure: LEFT BREAST LUMPECTOMY WITH RADIOACTIVE SEED LOCALIZATION;  Surgeon: Paige Bookbinder, [facility];  Location: [facility];  Service: General;  Laterality: Left;   BREAST SURGERY Left    Transflap   COLONOSCOPY     COLONOSCOPY     MASTECTOMY Left 2004   w/  TRAM flap construction and right breast augmentation with abdominoplasy   PARS PLANA VITRECTOMY Left 12/18/2018   Procedure: PARS PLANA VITRECTOMY WITH 25 GAUGE, ENDOLASER;  Surgeon: [facility];  Location: [facility];  Service: Ophthalmology;  Laterality: Left;   PORTACATH PLACEMENT N/A 09/25/2018   Procedure: INSERTION PORT-A-CATH WITH ULTRASOUND;  Surgeon: Paige Bookbinder, [facility];  Location: [facility];  Service: General;  Laterality: N/A;   TUBAL LIGATION Bilateral yrs ago    FAMILY HISTORY: Family History  Problem Relation Age of Onset   Diabetes Mother    Hypertension Mother    Kidney disease Foster    Colon cancer Neg Hx    Colon polyps Neg Hx    Gallbladder disease Neg Hx    Heart disease Neg Hx    Esophageal cancer Neg Hx    Stomach cancer Neg Hx    Rectal cancer Neg Hx   Paige Foster died from unknown causes in his early 62's. Patients' mother died from diabetes complications at age 78. The patient has 1 sister. Patient denies anyone in her family having breast, ovarian, prostate, or pancreatic cancer.    GYNECOLOGIC HISTORY:  Patient's last menstrual period was 06/08/2007. 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 Her husband, Paige Foster, works at [employer] [facility]. Paige Foster has two children, Paige Foster and Paige Foster. Paige Foster lives with her, is 64, and it attending [employer] for a computer based degree. Paige Foster lives with her, is 74, and recently graduated from [employer] with a degree in Careers information officer.  He works for [employer]. Camora has no grandchildren. She attends the [employer].   ADVANCED DIRECTIVES: In the absence of any documents to the contrary her husband, Paige Foster, is automatically her healthcare power of attorney     HEALTH MAINTENANCE: Social History   Tobacco Use   Smoking status: Never   Smokeless tobacco: Never  Vaping Use   Vaping Use: Never used  Substance Use Topics   Alcohol use: Not Currently    Alcohol/week: 0.0 standard drinks of alcohol    Comment: Occassionally   Drug use: No    Colonoscopy: April 2022, danis  PAP: January 2022, Nche  Bone density:  2017; -1.6, osteopenic   No Known Allergies  Current Outpatient Medications  Medication Sig Dispense Refill   alendronate (FOSAMAX) 70 MG tablet Take 1 tablet (70 mg total) by mouth every 7 (seven) days. Take with a full glass of water on an empty stomach. 12 tablet 3   amLODipine (NORVASC) 10 MG tablet TAKE 1 TABLET(10 MG) BY MOUTH DAILY 90 tablet 3   anastrozole (  ARIMIDEX) 1 MG tablet Take 1 tablet (1 mg total) by mouth daily. 90 tablet 4   cyclobenzaprine (FLEXERIL) 10 MG tablet Take 1 tablet (10 mg total) by mouth at bedtime. 30 tablet 0   losartan (COZAAR) 50 MG tablet TAKE 1 TABLET(50 MG) BY MOUTH DAILY 90 tablet 3   prednisoLONE acetate (PRED FORTE) 1 % ophthalmic suspension Place 1 drop into the left eye 4 (four) times daily. (Patient not taking: Reported on 01/27/2022)     predniSONE (STERAPRED UNI-PAK 48 TAB) 10 MG (48) TBPK tablet Please instruct a 12-day Dosepak. 48 tablet 0   No current facility-administered medications for this visit.     OBJECTIVE: African-American Foster who appears stated  age  20:   06/02/22 1148  BP: 134/84  Pulse: 75  Resp: 16  Temp: 97.9 F (36.6 C)  SpO2: 98%      Body mass index is 28.2 kg/m.   Wt Readings from Last 3 Encounters:  06/02/22 154 lb 3.2 oz (69.9 kg)  01/27/22 150 lb (68 kg)  01/15/22 153 lb (69.4 kg)    Physical Exam Constitutional:      Appearance: Normal appearance.  Chest:     Comments: Bilateral breasts inspected.  Status post left breast mastectomy, no palpable changes.  Right breast normal to inspection.  No regional adenopathy Musculoskeletal:     Cervical back: Normal range of motion and neck supple. No rigidity.  Lymphadenopathy:     Cervical: No cervical adenopathy.  Neurological:     Mental Status: She is alert.      LAB RESULTS:  CMP     Component Value Date/Time   NA 139 06/02/2022 1105   NA 140 10/12/2017 0950   K 3.9 06/02/2022 1105   CL 105 06/02/2022 1105   CO2 27 06/02/2022 1105   GLUCOSE 97 06/02/2022 1105   BUN 15 06/02/2022 1105   BUN 10 10/12/2017 0950   CREATININE 0.66 06/02/2022 1105   CALCIUM 9.6 06/02/2022 1105   PROT 7.4 06/02/2022 1105   PROT 7.5 10/12/2017 0950   ALBUMIN 4.3 06/02/2022 1105   ALBUMIN 4.3 10/12/2017 0950   AST 15 06/02/2022 1105   ALT 11 06/02/2022 1105   ALKPHOS 98 06/02/2022 1105   BILITOT 0.3 06/02/2022 1105   GFRNONAA >60 06/02/2022 1105   GFRAA >60 12/06/2019 1441   GFRAA >60 09/07/2018 1455   Lab Results  Component Value Date   WBC 4.7 06/02/2022   NEUTROABS 2.7 06/02/2022   HGB 12.7 06/02/2022   HCT 37.1 06/02/2022   MCV 94.9 06/02/2022   PLT 295 06/02/2022    Lab Results  Component Value Date   LABCA2 <4 08/30/2007    No components found for: "OHKGOV703"  No results for input(s): "INR" in the last 168 hours.  Lab Results  Component Value Date   LABCA2 <4 08/30/2007    No results found for: "CAN199"  No results found for: "CAN125"  No results found for: "CAN153"  No results found for: "CA2729"  No components found for:  "HGQUANT"  No results found for: "CEA1", "CEA" / No results found for: "CEA1", "CEA"  No results found for: "AFPTUMOR"  No results found for: "CHROMOGRNA"  No results found for: "TOTALPROTELP", "ALBUMINELP", "A1GS", "A2GS", "BETS", "BETA2SER", "GAMS", "MSPIKE", "SPEI" (this displays SPEP labs)  No results found for: "KPAFRELGTCHN", "LAMBDASER", "KAPLAMBRATIO" (kappa/lambda light chains)  No results found for: "HGBA", "HGBA2QUANT", "HGBFQUANT", "HGBSQUAN" (Hemoglobinopathy evaluation)   Lab Results  Component Value Date  LDH 169 08/30/2007    Lab Results  Component Value Date   IRON 63 11/23/2021   TIBC 388 11/23/2021   IRONPCTSAT 16 11/23/2021   (Iron and TIBC)  Lab Results  Component Value Date   FERRITIN 134 11/23/2021    Urinalysis    Component Value Date/Time   LABSPEC 1.015 04/09/2008 1548   PHURINE 7.0 04/09/2008 1548   HGBUR large 04/09/2008 1548   BILIRUBINUR negative 04/09/2008 1548   UROBILINOGEN 0.2 04/09/2008 1548   NITRITE negative 04/09/2008 1548    STUDIES:  No results found.   ELIGIBLE FOR AVAILABLE RESEARCH PROTOCOL: No  ASSESSMENT: 56 y.o. [facility], [facility]  (1) history of left-sided ductal carcinoma in situ 2004  (a) s/p left mastectomy with TRAM reconstruction  (b) status post tamoxifen x3 years  (2) left breast upper outer quadrant biopsy 08/30/2018 shows a clinical T1c N0 invasive ductal carcinoma, grade 3, estrogen receptor strongly positive, progesterone receptor negative, with HER-2 amplification, and and MIB-1 of 15%.   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    (b) chemotherapy discontinued after 4  cycles because of intercurrent eye surgery  (c) continued trastuzumab and pertuzumab to complete a year (last dose 09/06/2019)  (d) echocardiogram on 01/23/2019 that shows well preserved EF of 60-65%  (e) echocardiogram on 04/23/2019 shows EF of 60-65%  (f) echocardiogram 08/02/2019 shows an ejection fraction in the 60-65% range  (4) left lumpectomy 02/20/2019 showed a residual  ypT1c NX invasive ductal carcinoma, grade 2 with negative margins.    (5) adjuvant radiation: Radiation Treatment Dates: 04/12/2019 through 05/28/2019 Site Technique Total Dose (Gy) Dose per Fx (Gy) Completed Fx Beam Energies  Breast: CW_Lt 3D 50.4/50.4 1.8 28/28 6X, 10X  Breast: CW_Lt_SCV_PAB 3D 50.4/50.4 1.8 28/28 6X, 10X  Breast: CW_Lt_Bst Electron 10/10 2 5/5 6X, 10X   (6) anastrozole started 06/26/2019  (a) [facility]24[facility]2015 found a T score of -1.6 DEXA scan from mass June 2023 showed T score of -1.4 at L1-L2  (7) genetics testing 02/03/2019 through the Common Hereditary Cancers Panel offered by [facility] found no deleterious mutations in APC, ATM, AXIN2, BARD1, BMPR1A, BRCA1, BRCA2, BRIP1, CDH1, CDKN2A (p14ARF), CDKN2A (p16INK4a), CKD4, CHEK2, CTNNA1, DICER1, EPCAM (Deletion/duplication testing only), GREM1 (promoter region deletion/duplication testing only), KIT, MEN1, MLH1, MSH2, MSH3, MSH6, MUTYH, NBN, NF1, NHTL1, PALB2, PDGFRA, PMS2, POLD1, POLE, PTEN, RAD50, RAD51C, RAD51D, RNF43, SDHB, SDHC, SDHD, SMAD4, SMARCA4. STK11, TP53, TSC1, TSC2, and VHL.  The following genes were evaluated for sequence changes only: SDHA and HOXB13 c.251G>A variant only.   PLAN:  Patient is here for follow-up.  She has been taking anastrozole as prescribed.  She will continue anastrozole till December 2025.  Last mammogram in November without any evidence of malignancy.   Paige Pike, [facility] [address], [place] [address] Tel. [phone]    Fax.  [phone]  Total time spent, 30 minutes  *Total Encounter Time as defined by the Centers for Medicare and Medicaid Services includes, in addition to the face-to-face time of a patient visit (documented in the note above) non-face-to-face time: obtaining and reviewing outside history, ordering and reviewing medications, tests or procedures, care  coordination (communications with other health care professionals or caregivers) and documentation in the medical record.

## 2022-06-03 ENCOUNTER — Encounter: Payer: Self-pay | Admitting: Oncology

## 2022-06-11 ENCOUNTER — Other Ambulatory Visit: Payer: Self-pay | Admitting: *Deleted

## 2022-06-11 DIAGNOSIS — Z170 Estrogen receptor positive status [ER+]: Secondary | ICD-10-CM

## 2022-06-11 DIAGNOSIS — C50912 Malignant neoplasm of unspecified site of left female breast: Secondary | ICD-10-CM

## 2022-09-08 ENCOUNTER — Other Ambulatory Visit: Payer: Self-pay

## 2022-09-08 MED ORDER — ANASTROZOLE 1 MG PO TABS: 1.0000 mg | ORAL_TABLET | Freq: Every day | ORAL | 4 refills | Status: DC

## 2022-10-25 ENCOUNTER — Other Ambulatory Visit: Payer: Self-pay | Admitting: Nurse Practitioner

## 2022-10-25 ENCOUNTER — Encounter: Payer: Self-pay | Admitting: Oncology

## 2022-10-25 DIAGNOSIS — I10 Essential (primary) hypertension: Secondary | ICD-10-CM

## 2022-10-25 NOTE — Telephone Encounter (Signed)
Called and left a message.  Pt needs an appointment

## 2022-10-27 ENCOUNTER — Encounter: Payer: Self-pay | Admitting: Oncology

## 2022-10-27 ENCOUNTER — Ambulatory Visit (INDEPENDENT_AMBULATORY_CARE_PROVIDER_SITE_OTHER): Payer: BC Managed Care – PPO | Admitting: Nurse Practitioner

## 2022-10-27 ENCOUNTER — Encounter: Payer: Self-pay | Admitting: Nurse Practitioner

## 2022-10-27 VITALS — BP 120/80 | HR 46 | Temp 98.7°F | Resp 16 | Ht 62.0 in | Wt 154.0 lb

## 2022-10-27 DIAGNOSIS — C50412 Malignant neoplasm of upper-outer quadrant of left female breast: Secondary | ICD-10-CM | POA: Diagnosis not present

## 2022-10-27 DIAGNOSIS — Z170 Estrogen receptor positive status [ER+]: Secondary | ICD-10-CM

## 2022-10-27 DIAGNOSIS — M8580 Other specified disorders of bone density and structure, unspecified site: Secondary | ICD-10-CM | POA: Diagnosis not present

## 2022-10-27 DIAGNOSIS — C50912 Malignant neoplasm of unspecified site of left female breast: Secondary | ICD-10-CM

## 2022-10-27 DIAGNOSIS — I10 Essential (primary) hypertension: Secondary | ICD-10-CM

## 2022-10-27 DIAGNOSIS — E7800 Pure hypercholesterolemia, unspecified: Secondary | ICD-10-CM | POA: Diagnosis not present

## 2022-10-27 LAB — RENAL FUNCTION PANEL
Albumin: 4.3 g/dL (ref 3.5–5.2)
BUN: 9 mg/dL (ref 6–23)
CO2: 28 mEq/L (ref 19–32)
Calcium: 10 mg/dL (ref 8.4–10.5)
Chloride: 102 mEq/L (ref 96–112)
Creatinine, Ser: 0.63 mg/dL (ref 0.40–1.20)
GFR: 97.94 mL/min (ref >60.00)
Glucose, Bld: 90 mg/dL (ref 70–99)
Phosphorus: 3.3 mg/dL (ref 2.3–4.6)
Potassium: 3.9 mEq/L (ref 3.5–5.1)
Sodium: 137 mEq/L (ref 135–145)

## 2022-10-27 LAB — LIPID PANEL
Cholesterol: 303 mg/dL — ABNORMAL HIGH (ref 0–200)
HDL: 68.3 mg/dL (ref >39.00)
LDL Cholesterol: 219 mg/dL — ABNORMAL HIGH (ref 0–99)
NonHDL: 235.15
Total CHOL/HDL Ratio: 4
Triglycerides: 80 mg/dL (ref 0.0–149.0)
VLDL: 16 mg/dL (ref 0.0–40.0)

## 2022-10-27 MED ORDER — LOSARTAN POTASSIUM 50 MG PO TABS: ORAL_TABLET | ORAL | 3 refills | Status: DC

## 2022-10-27 MED ORDER — AMLODIPINE BESYLATE 10 MG PO TABS: ORAL_TABLET | ORAL | 3 refills | Status: DC

## 2022-10-27 NOTE — Assessment & Plan Note (Signed)
First diagnosis 2004 (stage 0 non-invasive). S/p L. Mastectomy with construction, took tamoxifen x 2yrs Second diagnosis 2020 (invasive ductal carcinoma grade3, estrogen sensitive, negative genetic mutation, negative mets, completed chemotherapy 2021, completed radiation therapy 2020, Current use of Arimidex daily) Last mammogram 2022: normal Dexa scan completed 12/2021: osteopenia Hx of spontaneous rib fracture-healed Started fosamax, but stopped med after 56months per patient. Advised to resume fosamax and maintain calcium 1200mg  and vit D3 1000IU daily. Advised to schedule repeat diagnostic mammogram Advised to maintain annual appts with oncology

## 2022-10-27 NOTE — Assessment & Plan Note (Signed)
BP at goal with losartan and amlodipine BP Readings from Last 3 Encounters:  10/27/22 120/80  06/02/22 134/84  01/27/22 106/60    Maintain med doses Repeat BMP F/up in 19months

## 2022-10-27 NOTE — Assessment & Plan Note (Signed)
Dexa scan completed 12/2021: osteopenia Hx of spontaneous left rib fracture-healed Started fosamax, but stopped med after 65months per patient. Advised to resume fosamax and maintain calcium 1200mg  and vit D3 1000IU daily.

## 2022-10-27 NOTE — Assessment & Plan Note (Deleted)
First diagnosis 2004 (stage 0 non-invasive). S/p L. Mastectomy with construction, took tamoxifen x 29yrs Second diagnosis 2020 (invasive ductal carcinoma grade3, estrogen sensitive, negative genetic mutation, negative mets, completed chemotherapy 2021, completed radiation therapy 2020, Current use of Arimidex daily) Last mammogram 2022: normal Dexa scan completed 12/2021: osteopenia Hx of spontaneous rib fracture-healed Started fosamax, but stopped med after 65months per patient. Advised to resume fosamax and maintain calcium 1200mg  and vit D3 1000IU daily. Advised to schedule repeat diagnostic mammogram Advised to maintain annual appts with oncology

## 2022-10-27 NOTE — Progress Notes (Signed)
Established Patient Visit  Patient: [facility]   DOB: 1965/11/22   57 y.o. Female  MRN: [id number] Visit Date: 10/27/2022  Subjective:    Chief Complaint  Patient presents with   Hypertension    Med refill- Amlodipine    Osteopenia Dexa scan completed 12/2021: osteopenia Hx of spontaneous left rib fracture-healed Started fosamax, but stopped med after 89months per patient. Advised to resume fosamax and maintain calcium 1200mg  and vit D3 1000IU daily.  Pure hypercholesterolemia Repeat lipid panel  HTN (hypertension) BP at goal with losartan and amlodipine BP Readings from Last 3 Encounters:  10/27/22 120/80  06/02/22 134/84  01/27/22 106/60    Maintain med doses Repeat BMP F/up in 21months  Recurrent breast cancer, left ([place]) First diagnosis 2004 (stage 0 non-invasive). S/p L. Mastectomy with construction, took tamoxifen x 67yrs Second diagnosis 2020 (invasive ductal carcinoma grade3, estrogen sensitive, negative genetic mutation, negative mets, completed chemotherapy 2021, completed radiation therapy 2020, Current use of Arimidex daily) Last mammogram 2022: normal Dexa scan completed 12/2021: osteopenia Hx of spontaneous rib fracture-healed Started fosamax, but stopped med after 64months per patient. Advised to resume fosamax and maintain calcium 1200mg  and vit D3 1000IU daily. Advised to schedule repeat diagnostic mammogram Advised to maintain annual appts with oncology   Wt Readings from Last 3 Encounters:  10/27/22 154 lb (69.9 kg)  06/02/22 154 lb 3.2 oz (69.9 kg)  01/27/22 150 lb (68 kg)    Reviewed medical, surgical, and social history today  Medications: Outpatient Medications Prior to Visit  Medication Sig   anastrozole (ARIMIDEX) 1 MG tablet Take 1 tablet (1 mg total) by mouth daily.   prednisoLONE acetate (PRED FORTE) 1 % ophthalmic suspension Place 1 drop into the left eye 4 (four) times daily.   [DISCONTINUED] amLODipine (NORVASC) 10 MG  tablet TAKE 1 TABLET(10 MG) BY MOUTH DAILY   [DISCONTINUED] losartan (COZAAR) 50 MG tablet TAKE 1 TABLET(50 MG) BY MOUTH DAILY   [DISCONTINUED] predniSONE (STERAPRED UNI-PAK 48 TAB) 10 MG (48) TBPK tablet Please instruct a 12-day Dosepak.   alendronate (FOSAMAX) 70 MG tablet Take 1 tablet (70 mg total) by mouth every 7 (seven) days. Take with a full glass of water on an empty stomach.   [DISCONTINUED] cyclobenzaprine (FLEXERIL) 10 MG tablet Take 1 tablet (10 mg total) by mouth at bedtime. (Patient not taking: Reported on 10/27/2022)   No facility-administered medications prior to visit.   Reviewed past medical and social history.   ROS per HPI above      Objective:  BP 120/80 (BP Location: Right Arm, Patient Position: Sitting, Cuff Size: Large)   Pulse (!) 46   Temp 98.7 F (37.1 C) (Temporal)   Resp 16   Ht 5\' 2"  (1.575 m)   Wt 154 lb (69.9 kg)   LMP 06/08/2007 Comment: Just stopped having period  SpO2 99%   BMI 28.17 kg/m      Physical Exam Vitals and nursing note reviewed.  Cardiovascular:     Rate and Rhythm: Normal rate and regular rhythm.     Pulses: Normal pulses.     Heart sounds: Normal heart sounds.  Pulmonary:     Effort: Pulmonary effort is normal.     Breath sounds: Normal breath sounds.  Neurological:     Mental Status: She is alert and oriented to person, place, and time.     No results found for any visits on  10/27/22.    Assessment & Plan:    Problem List Items Addressed This Visit       Cardiovascular and Mediastinum   HTN (hypertension) - Primary    BP at goal with losartan and amlodipine BP Readings from Last 3 Encounters:  10/27/22 120/80  06/02/22 134/84  01/27/22 106/60    Maintain med doses Repeat BMP F/up in 22months      Relevant Medications   losartan (COZAAR) 50 MG tablet   amLODipine (NORVASC) 10 MG tablet   Other Relevant Orders   Renal Function Panel     Musculoskeletal and Integument   Osteopenia    Dexa scan completed  12/2021: osteopenia Hx of spontaneous left rib fracture-healed Started fosamax, but stopped med after 30months per patient. Advised to resume fosamax and maintain calcium 1200mg  and vit D3 1000IU daily.        Other   Malignant neoplasm of upper-outer quadrant of left breast in female, estrogen receptor positive   Pure hypercholesterolemia    Repeat lipid panel      Relevant Medications   losartan (COZAAR) 50 MG tablet   amLODipine (NORVASC) 10 MG tablet   Other Relevant Orders   Lipid panel   Recurrent breast cancer, left    First diagnosis 2004 (stage 0 non-invasive). S/p L. Mastectomy with construction, took tamoxifen x 26yrs Second diagnosis 2020 (invasive ductal carcinoma grade3, estrogen sensitive, negative genetic mutation, negative mets, completed chemotherapy 2021, completed radiation therapy 2020, Current use of Arimidex daily) Last mammogram 2022: normal Dexa scan completed 12/2021: osteopenia Hx of spontaneous rib fracture-healed Started fosamax, but stopped med after 33months per patient. Advised to resume fosamax and maintain calcium 1200mg  and vit D3 1000IU daily. Advised to schedule repeat diagnostic mammogram Advised to maintain annual appts with oncology      Return in about 6 months (around 04/28/2023) for CPE (fasting).     Wilfred Lacy, NP

## 2022-10-27 NOTE — Assessment & Plan Note (Signed)
Repeat lipid panel ?

## 2022-10-27 NOTE — Patient Instructions (Addendum)
Calcium 1200mg   and vit. D3 1000IU daily Resume fosamax Schedule appt for mammogram Send shingrix vaccine dates. Go to lab

## 2022-10-28 MED ORDER — ATORVASTATIN CALCIUM 20 MG PO TABS: 20.0000 mg | ORAL_TABLET | Freq: Every day | ORAL | 3 refills | Status: DC

## 2022-10-28 NOTE — Progress Notes (Signed)
Abnormal: Normal CMP Abnormal lipid panel: very high LDL and total cholesterol. Start atorvastatin. New rx sent

## 2022-10-28 NOTE — Addendum Note (Signed)
Addended by: Leana Gamer on: 10/28/2022 03:18 PM   Modules accepted: Orders

## 2022-10-29 ENCOUNTER — Telehealth: Payer: Self-pay | Admitting: Hematology and Oncology

## 2022-10-29 NOTE — Telephone Encounter (Signed)
patient called to cancel her appointment in june.

## 2022-11-22 ENCOUNTER — Telehealth: Payer: Self-pay | Admitting: Nurse Practitioner

## 2022-11-22 NOTE — Telephone Encounter (Signed)
Called patient back and gave the number to Billing

## 2022-11-22 NOTE — Telephone Encounter (Signed)
Please give the pt a call she said she was charged for a partial physical on her last visitand she don't know why please give the pt a call

## 2022-11-29 DIAGNOSIS — H401131 Primary open-angle glaucoma, bilateral, mild stage: Secondary | ICD-10-CM | POA: Diagnosis not present

## 2023-01-06 ENCOUNTER — Other Ambulatory Visit: Payer: Self-pay | Admitting: Nurse Practitioner

## 2023-01-06 DIAGNOSIS — I10 Essential (primary) hypertension: Secondary | ICD-10-CM

## 2023-01-17 ENCOUNTER — Ambulatory Visit: Payer: BC Managed Care – PPO | Admitting: Hematology and Oncology

## 2023-06-02 ENCOUNTER — Ambulatory Visit: Payer: BC Managed Care – PPO | Admitting: Hematology and Oncology

## 2023-06-06 DIAGNOSIS — H401131 Primary open-angle glaucoma, bilateral, mild stage: Secondary | ICD-10-CM | POA: Diagnosis not present

## 2023-06-17 ENCOUNTER — Encounter: Payer: Self-pay | Admitting: Nurse Practitioner

## 2023-06-17 DIAGNOSIS — Z1231 Encounter for screening mammogram for malignant neoplasm of breast: Secondary | ICD-10-CM | POA: Diagnosis not present

## 2023-06-17 DIAGNOSIS — R92323 Mammographic fibroglandular density, bilateral breasts: Secondary | ICD-10-CM | POA: Diagnosis not present

## 2023-06-17 LAB — HM MAMMOGRAPHY

## 2023-06-20 ENCOUNTER — Inpatient Hospital Stay: Payer: BC Managed Care – PPO | Admitting: Hematology and Oncology

## 2023-06-20 NOTE — Progress Notes (Deleted)
[facility]  Telephone:([phone] Fax:([phone]) [phone]    ID: BLESSYN ZWEIFEL DOB: 07-14-1966  MR#: [id number]  JYN#:[id number]  Patient Care Team: Anne Ng, NP as PCP - General (Internal Medicine) Magrinat, Valentino Hue, MD (Inactive) as Consulting Physician (Oncology) Emelia Loron, MD as Consulting Physician (General Surgery) Nche, Bonna Gains, NP as Nurse Practitioner (Internal Medicine) Carmela Rima, MD as Consulting Physician (Ophthalmology) Laurey Morale, MD as Consulting Physician (Cardiology) Louisa Second, MD (Inactive) as Consulting Physician (Plastic Surgery) Sherrilyn Rist, MD as Consulting Physician (Gastroenterology) Shea Evans Genice Rouge [facility]) OTHER MD:    CHIEF COMPLAINT: 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 Wolinski is here for follow-up on anastrozole.  Since last visit, she tells me that she had a rib fracture in the left side, recently had x-rays.  She was recovering well until she lifted something heavy while giving her dog a bath and started having the pain again.  She otherwise denies any changes in the breast.  She is due for mammogram but has not scheduled this yet.  She is taking all her medications as prescribed.  Rest of the pertinent 10 point ROS reviewed and negative   HISTORY OF CURRENT ILLNESS: From the original intake note:  Paige Foster has a prior history of left breast cancer, dating back to 2004. At that time she underwent a left mastectomy for stage 0 (noninvasive) breast cancer, with transverse rectus abdominis (TRAM) flap construction under Dr. Shon Hough. She also underwent a right breast reduction. She took tamoxifen for three years.  More recently she underwent bilateral diagnostic mammography with tomography and left breast ultrasonography at [facility] on 01/03/2018 showing: Breast Density Category B. There is an oval  fat containing lesion in the left breast upper outer quadrant posterior depth. No other significant masses, calcifications, or other findings are seen in either breast. Sonographically, there is a 1.5 cm lesion in the left breast upper outer quadrant posterior depth. This lesion is of mixed echogenicity. This correlates as palpated and with mammography findings. Follow up was recommended.  Close follow-up was suggested.  She then presented with a non-tender mass in the left reconstructed breast on 08/30/2018. On physical exam, there is a hard palpable lump measuring 2.0 cm in the upper outer left reconstructed breast 10 cm from the expected location of a nipple. Sonography over this area demonstrates a 1.8 cm x 1.7 cm x 1.4 cm mass in the left breast at 2 o'clock posterior depth 10 cm from the nipple. This mass is of mixed echogenicity. This abnormality is increased in size and correlates as palpated and with prior mammography findings. Color flow imaging demonstrates that there is vascularity present. Elastography imaging assessment is intermediate. No significant abnormalities were seen sonographically in the left axilla.    Accordingly on 08/30/2018 she proceeded to biopsy of the left breast mass in question. The pathology from this procedure showed ([id number]): invasive ductal carcinoma, grade III. Prognostic indicators significant for: estrogen receptor, 100% positive with strong staining intensity and progesterone receptor, 0% negative. Proliferation marker Ki67 at 15%. HER2 positive (3+) by immunohistochemistry.  The patient's subsequent history is as detailed below.   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  Recurrent breast cancer, left [facility]) oncologist-- dr Darnelle Catalan    dx 2004, noninvasive Stage 0 ----s/p left mastectomy  w/ tram flap construction (and right breast reduction), taken Tamoxifen for 3 yrs;   08-30-2018 recurrent left cancer , Grade III,  cT1c,  ER positive, PR negative, HER-2 positive, invasive ductal carcinoma-- neoadjuvant chemo to start 09-26-2018   Renal artery stenosis (HCC)    mild right external renal artery stenosis per duplex in epic 08-09-2013   Wears glasses     PAST SURGICAL HISTORY: Past Surgical History:  Procedure Laterality Date   BREAST LUMPECTOMY WITH RADIOACTIVE SEED LOCALIZATION Left 02/20/2019   Procedure: LEFT BREAST LUMPECTOMY WITH RADIOACTIVE SEED LOCALIZATION;  Surgeon: Emelia Loron, MD;  Location: [facility] OR;  Service: General;  Laterality: Left;   BREAST SURGERY Left    Transflap   COLONOSCOPY     COLONOSCOPY     MASTECTOMY Left 2004   w/  TRAM flap construction and right breast augmentation with abdominoplasy   PARS PLANA VITRECTOMY Left 12/18/2018   Procedure: PARS PLANA VITRECTOMY WITH 25 GAUGE, ENDOLASER;  Surgeon: Carmela Rima, MD;  Location: [facility] OR;  Service: Ophthalmology;  Laterality: Left;   PORTACATH PLACEMENT N/A 09/25/2018   Procedure: INSERTION PORT-A-CATH WITH ULTRASOUND;  Surgeon: Emelia Loron, MD;  Location: [facility] ORS;  Service: General;  Laterality: N/A;   TUBAL LIGATION Bilateral yrs ago    FAMILY HISTORY: Family History  Problem Relation Age of Onset   Diabetes Mother    Hypertension Mother    Kidney disease Father    Colon cancer Neg Hx    Colon polyps Neg Hx    Gallbladder disease Neg Hx    Heart disease Neg Hx    Esophageal cancer Neg Hx    Stomach cancer Neg Hx    Rectal cancer Neg Hx   Deisy's father died from unknown causes in his early 50's. Patients' mother died from diabetes complications at age 69. The patient has 1 sister. Patient denies anyone in her family having breast, ovarian, prostate, or pancreatic cancer.    GYNECOLOGIC HISTORY:  Patient's last menstrual period was 06/08/2007. 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 Her husband, Hewitt Shorts, works at [employer]. Yenia has two children, Ivin Booty and Onalee Hua. Ivin Booty lives with her, is 29, and it attending [employer] for a computer based degree. Onalee Hua lives with her, is 37, and recently graduated from [employer] with a degree in Pension scheme manager.  He works for [employer]. Mardean has no grandchildren. She attends the [employer].   ADVANCED DIRECTIVES: In the absence of any documents to the contrary her husband, Hewitt Shorts, is automatically her healthcare power of attorney     HEALTH MAINTENANCE: Social History   Tobacco Use   Smoking status: Never   Smokeless tobacco: Never  Vaping Use   Vaping status: Never Used  Substance Use Topics   Alcohol use: Not Currently    Alcohol/week: 0.0 standard drinks of alcohol    Comment: Occassionally   Drug use: No    Colonoscopy: April 2022, danis  PAP: January 2022, Nche  Bone density:  2017; -1.6, osteopenic   No Known Allergies  Current Outpatient Medications  Medication Sig Dispense Refill   alendronate (FOSAMAX) 70 MG tablet Take 1 tablet (70 mg total) by mouth every 7 (  seven) days. Take with a full glass of water on an empty stomach. 12 tablet 3   amLODipine (NORVASC) 10 MG tablet Take 1 tablet by mouth daily. 90 tablet 2   anastrozole (ARIMIDEX) 1 MG tablet Take 1 tablet (1 mg total) by mouth daily. 90 tablet 4   atorvastatin (LIPITOR) 20 MG tablet Take 1 tablet (20 mg total) by mouth daily. 90 tablet 3   losartan (COZAAR) 50 MG tablet TAKE 1 TABLET(50 MG) BY MOUTH DAILY 90 tablet 3   prednisoLONE acetate (PRED FORTE) 1 % ophthalmic suspension Place 1 drop into the left eye 4 (four) times daily.     No current facility-administered medications for this visit.     OBJECTIVE: African-American woman who appears stated  age  There were no vitals filed for this visit.     There is no height or weight on file to calculate BMI.   Wt Readings from Last 3 Encounters:  10/27/22 154 lb (69.9 kg)  06/02/22 154 lb 3.2 oz (69.9 kg)  01/27/22 150 lb (68 kg)    Physical examination not done, telephone visit   LAB RESULTS:  CMP     Component Value Date/Time   NA 137 10/27/2022 1404   NA 140 10/12/2017 0950   K 3.9 10/27/2022 1404   CL 102 10/27/2022 1404   CO2 28 10/27/2022 1404   GLUCOSE 90 10/27/2022 1404   BUN 9 10/27/2022 1404   BUN 10 10/12/2017 0950   CREATININE 0.63 10/27/2022 1404   CREATININE 0.66 06/02/2022 1105   CALCIUM 10.0 10/27/2022 1404   PROT 7.4 06/02/2022 1105   PROT 7.5 10/12/2017 0950   ALBUMIN 4.3 10/27/2022 1404   ALBUMIN 4.3 10/12/2017 0950   AST 15 06/02/2022 1105   ALT 11 06/02/2022 1105   ALKPHOS 98 06/02/2022 1105   BILITOT 0.3 06/02/2022 1105   GFRNONAA >60 06/02/2022 1105   GFRAA >60 12/06/2019 1441   GFRAA >60 09/07/2018 1455   Lab Results  Component Value Date   WBC 4.7 06/02/2022   NEUTROABS 2.7 06/02/2022   HGB 12.7 06/02/2022   HCT 37.1 06/02/2022   MCV 94.9 06/02/2022   PLT 295 06/02/2022    Lab Results  Component Value Date   LABCA2 <4 08/30/2007    No components found for: "WUJWJX914"  No results for input(s): "INR" in the last 168 hours.  Lab Results  Component Value Date   LABCA2 <4 08/30/2007    No results found for: "CAN199"  No results found for: "CAN125"  No results found for: "CAN153"  No results found for: "CA2729"  No components found for: "HGQUANT"  No results found for: "CEA1", "CEA" / No results found for: "CEA1", "CEA"  No results found for: "AFPTUMOR"  No results found for: "CHROMOGRNA"  No results found for: "TOTALPROTELP", "ALBUMINELP", "A1GS", "A2GS", "BETS", "BETA2SER", "GAMS", "MSPIKE", "SPEI" (this displays SPEP labs)  No results found for: "KPAFRELGTCHN", "LAMBDASER", "KAPLAMBRATIO" (kappa/lambda light  chains)  No results found for: "HGBA", "HGBA2QUANT", "HGBFQUANT", "HGBSQUAN" (Hemoglobinopathy evaluation)   Lab Results  Component Value Date   LDH 169 08/30/2007    Lab Results  Component Value Date   IRON 56 06/02/2022   TIBC 431 06/02/2022   IRONPCTSAT 13 06/02/2022   (Iron and TIBC)  Lab Results  Component Value Date   FERRITIN 98 06/02/2022    Urinalysis    Component Value Date/Time   LABSPEC 1.015 04/09/2008 1548   PHURINE 7.0 04/09/2008 1548   HGBUR large 04/09/2008  1548   BILIRUBINUR negative 04/09/2008 1548   UROBILINOGEN 0.2 04/09/2008 1548   NITRITE negative 04/09/2008 1548    STUDIES:  No results found.   ELIGIBLE FOR AVAILABLE RESEARCH PROTOCOL: No  ASSESSMENT: 57 y.o. [place], [place] woman  (1) history of left-sided ductal carcinoma in situ 2004  (a) s/p left mastectomy with TRAM reconstruction  (b) status post tamoxifen x3 years  (2) left breast upper outer quadrant biopsy 08/30/2018 shows a clinical T1c N0 invasive ductal carcinoma, grade 3, estrogen receptor strongly positive, progesterone receptor negative, with HER-2 amplification, and and MIB-1 of 15%.   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    (b) chemotherapy discontinued after 4 cycles because of intercurrent eye surgery  (c) continued trastuzumab and pertuzumab to complete a year (last dose 09/06/2019)  (d) echocardiogram on 01/23/2019 that shows well preserved EF of 60-65%  (e) echocardiogram on 04/23/2019 shows EF of 60-65%  (f) echocardiogram 08/02/2019 shows an ejection fraction in the 60-65% range  (4) left lumpectomy 02/20/2019 showed a residual  ypT1c NX  invasive ductal carcinoma, grade 2 with negative margins.    (5) adjuvant radiation: Radiation Treatment Dates: 04/12/2019 through 05/28/2019 Site Technique Total Dose (Gy) Dose per Fx (Gy) Completed Fx Beam Energies  Breast: CW_Lt 3D 50.4/50.4 1.8 28/28 6X, 10X  Breast: CW_Lt_SCV_PAB 3D 50.4/50.4 1.8 28/28 6X, 10X  Breast: CW_Lt_Bst Electron 10/10 2 5/5 6X, 10X   (6) anastrozole started 06/26/2019  (a) DEXA scan at [facility] 12/24/2015 found a T score of -1.6 DEXA scan from mass June 2023 showed T score of -1.4 at L1-L2  (7) genetics testing 02/03/2019 through the Common Hereditary Cancers Panel offered by [facility] found no deleterious mutations in APC, ATM, AXIN2, BARD1, BMPR1A, BRCA1, BRCA2, BRIP1, CDH1, CDKN2A (p14ARF), CDKN2A (p16INK4a), CKD4, CHEK2, CTNNA1, DICER1, EPCAM (Deletion/duplication testing only), GREM1 (promoter region deletion/duplication testing only), KIT, MEN1, MLH1, MSH2, MSH3, MSH6, MUTYH, NBN, NF1, NHTL1, PALB2, PDGFRA, PMS2, POLD1, POLE, PTEN, RAD50, RAD51C, RAD51D, RNF43, SDHB, SDHC, SDHD, SMAD4, SMARCA4. STK11, TP53, TSC1, TSC2, and VHL.  The following genes were evaluated for sequence changes only: SDHA and HOXB13 c.251G>A variant only.   PLAN:  Patient is here for follow up. She denies any new changes except for some rib pain in the left breast or under the left breast.  She was diagnosed with a minimally displaced left rib fracture and she was healing well up until recently when she lifted something heavy and started noticing some pain again.  She denies any other breast changes. No concerns on physical examination today.  Her last gram in [place] was November 2022, I have encouraged her to reschedule this since she is about due.  She expressed understanding.  She will otherwise continue anastrozole. Most recent bone density showed osteopenia hence advised vitamin D as tolerated, regular weightbearing exercises. Return to clinic in 1 year or sooner as needed.  Rachel Moulds, MD  Medical Oncology and Hematology [facility] [address] [place], [place] [address] Tel. [phone]    Fax. [phone]  Total time spent, 15 minutes  *Total Encounter Time as defined by the Centers for Medicare and Medicaid Services includes, in addition to the face-to-face time of a  patient visit (documented in the note above) non-face-to-face time: obtaining and reviewing outside history, ordering and reviewing medications, tests or procedures, care coordination (communications with other health care professionals or caregivers) and documentation in the medical record.

## 2023-06-29 ENCOUNTER — Telehealth: Payer: Self-pay | Admitting: Hematology and Oncology

## 2023-06-29 NOTE — Telephone Encounter (Signed)
Pt left message to cancel appointment.  Called back to reschedule, no answer.  Lm to reschedule.

## 2023-07-04 ENCOUNTER — Ambulatory Visit (INDEPENDENT_AMBULATORY_CARE_PROVIDER_SITE_OTHER): Payer: BC Managed Care – PPO | Admitting: Nurse Practitioner

## 2023-07-04 ENCOUNTER — Encounter: Payer: Self-pay | Admitting: Nurse Practitioner

## 2023-07-04 VITALS — BP 120/74 | HR 70 | Temp 98.4°F | Resp 18 | Ht 62.0 in | Wt 142.2 lb

## 2023-07-04 DIAGNOSIS — I10 Essential (primary) hypertension: Secondary | ICD-10-CM

## 2023-07-04 DIAGNOSIS — H0520 Unspecified exophthalmos: Secondary | ICD-10-CM | POA: Insufficient documentation

## 2023-07-04 DIAGNOSIS — E7800 Pure hypercholesterolemia, unspecified: Secondary | ICD-10-CM

## 2023-07-04 DIAGNOSIS — Z79811 Long term (current) use of aromatase inhibitors: Secondary | ICD-10-CM | POA: Insufficient documentation

## 2023-07-04 DIAGNOSIS — M8588 Other specified disorders of bone density and structure, other site: Secondary | ICD-10-CM

## 2023-07-04 DIAGNOSIS — M8580 Other specified disorders of bone density and structure, unspecified site: Secondary | ICD-10-CM

## 2023-07-04 LAB — COMPREHENSIVE METABOLIC PANEL
ALT: 29 U/L (ref 0–35)
AST: 31 U/L (ref 0–37)
Albumin: 4.1 g/dL (ref 3.5–5.2)
Alkaline Phosphatase: 92 U/L (ref 39–117)
BUN: 13 mg/dL (ref 6–23)
CO2: 27 meq/L (ref 19–32)
Calcium: 9.6 mg/dL (ref 8.4–10.5)
Chloride: 102 meq/L (ref 96–112)
Creatinine, Ser: 0.7 mg/dL (ref 0.40–1.20)
GFR: 95.03 mL/min (ref >60.00)
Glucose, Bld: 95 mg/dL (ref 70–99)
Potassium: 3.7 meq/L (ref 3.5–5.1)
Sodium: 138 meq/L (ref 135–145)
Total Bilirubin: 0.4 mg/dL (ref 0.2–1.2)
Total Protein: 7.3 g/dL (ref 6.0–8.3)

## 2023-07-04 MED ORDER — ALENDRONATE SODIUM 70 MG PO TABS
70.0000 mg | ORAL_TABLET | ORAL | 3 refills | Status: DC
Start: 2023-07-04 — End: 2023-09-30

## 2023-07-04 NOTE — Progress Notes (Signed)
Established Patient Visit  Patient: Paige Foster   DOB: 05-May-1966   57 y.o. Female  MRN: [id number] Visit Date: 07/04/2023  Subjective:    Chief Complaint  Patient presents with   OFFICE VISIT     PT is requesting lab work for thyroid. RX refill request for Fosamax 70 mg and is due for shingles vaccine    HPI Pure hypercholesterolemia She decline lipid panel test today. She wants to work of diet prior to repeat test. Advised to maintain mediterranean diet and continue lipitor dose She is to schedule lab appointment for lipid panel in 2months (fasting)  HTN (hypertension) BP at goal with losartan and amlodipine BP Readings from Last 3 Encounters:  07/04/23 120/74  10/27/22 120/80  06/02/22 134/84    Maintain med doses Repeat CMP F/up in 6months  Bulging eyeball. Wants thyroid panel completed  Wt Readings from Last 3 Encounters:  07/04/23 142 lb 3.2 oz (64.5 kg)  10/27/22 154 lb (69.9 kg)  06/02/22 154 lb 3.2 oz (69.9 kg)     Reviewed medical, surgical, and social history today  Medications: Outpatient Medications Prior to Visit  Medication Sig Note   amLODipine (NORVASC) 10 MG tablet Take 1 tablet by mouth daily.    anastrozole (ARIMIDEX) 1 MG tablet Take 1 tablet (1 mg total) by mouth daily.    atorvastatin (LIPITOR) 20 MG tablet Take 1 tablet (20 mg total) by mouth daily.    brimonidine (ALPHAGAN) 0.15 % ophthalmic solution 1 drop 2 (two) times daily.    brimonidine (ALPHAGAN) 0.2 % ophthalmic solution SMARTSIG:In Eye(s)    dorzolamide-timolol (COSOPT) 2-0.5 % ophthalmic solution 1 drop 2 (two) times daily.    losartan (COZAAR) 50 MG tablet TAKE 1 TABLET(50 MG) BY MOUTH DAILY    [DISCONTINUED] alendronate (FOSAMAX) 70 MG tablet Take 1 tablet (70 mg total) by mouth every 7 (seven) days. Take with a full glass of water on an empty stomach. 07/04/2023: Refill request    prednisoLONE acetate (PRED FORTE) 1 % ophthalmic suspension Place 1 drop into the  left eye 4 (four) times daily. (Patient not taking: Reported on 07/04/2023)    No facility-administered medications prior to visit.   Reviewed past medical and social history.   ROS per HPI above      Objective:  BP 120/74   Pulse 70   Temp 98.4 F (36.9 C) (Temporal)   Resp 18   Ht 5\' 2"  (1.575 m)   Wt 142 lb 3.2 oz (64.5 kg)   LMP 06/08/2007 Comment: Just stopped having period  SpO2 99%   BMI 26.01 kg/m      Physical Exam Neck:     Thyroid: No thyroid mass, thyromegaly or thyroid tenderness.  Cardiovascular:     Rate and Rhythm: Normal rate and regular rhythm.     Pulses: Normal pulses.     Heart sounds: Normal heart sounds.  Pulmonary:     Effort: Pulmonary effort is normal.     Breath sounds: Normal breath sounds.  Musculoskeletal:     Cervical back: Normal range of motion and neck supple.  Lymphadenopathy:     Cervical: No cervical adenopathy.  Neurological:     Mental Status: She is alert and oriented to person, place, and time.     No results found for any visits on 07/04/23.    Assessment & Plan:    Problem List Items Addressed This  Visit     HTN (hypertension) - Primary    BP at goal with losartan and amlodipine BP Readings from Last 3 Encounters:  07/04/23 120/74  10/27/22 120/80  06/02/22 134/84    Maintain med doses Repeat CMP F/up in 6months      Relevant Orders   Comprehensive metabolic panel   Osteopenia   Relevant Medications   alendronate (FOSAMAX) 70 MG tablet   Pure hypercholesterolemia    She decline lipid panel test today. She wants to work of diet prior to repeat test. Advised to maintain mediterranean diet and continue lipitor dose She is to schedule lab appointment for lipid panel in 2months (fasting)      Relevant Orders   Comprehensive metabolic panel   Lipid panel   Other Visit Diagnoses     Use of anastrozole (Arimidex)       Relevant Medications   alendronate (FOSAMAX) 70 MG tablet   Bulging eyes        Relevant Orders   Thyroid Panel With TSH      Return in about 6 months (around 01/02/2024) for HTN, hyperlipidemia (fasting).     Alysia Penna, NP

## 2023-07-04 NOTE — Assessment & Plan Note (Signed)
BP at goal with losartan and amlodipine BP Readings from Last 3 Encounters:  07/04/23 120/74  10/27/22 120/80  06/02/22 134/84    Maintain med doses Repeat CMP F/up in 6months

## 2023-07-04 NOTE — Assessment & Plan Note (Signed)
She decline lipid panel test today. She wants to work of diet prior to repeat test. Advised to maintain mediterranean diet and continue lipitor dose She is to schedule lab appointment for lipid panel in 2months (fasting)

## 2023-07-04 NOTE — Patient Instructions (Signed)
Go to lab Continue Heart healthy diet and daily exercise. Maintain current medications.  Return to lab for repeat lipid panel in 3months. You need to be fasting 8hrs prior to blood draw. Ok to drink water and take BP meds.

## 2023-07-04 NOTE — Assessment & Plan Note (Signed)
States ophthalmology recommended thyroid panel check due to protruding eyeball. She denies any palpitations, unintentional weight loss, increased diaphoresis, anxiety, change in appetite, change in hair/skin/nails, or tremors  Thyroid does not feel enlarged on exam Check thyroid panel

## 2023-07-05 LAB — THYROID PANEL WITH TSH
Free Thyroxine Index: 2.3 (ref 1.4–3.8)
T3 Uptake: 28 % (ref 22–35)
T4, Total: 8.1 ug/dL (ref 5.1–11.9)
TSH: 0.88 m[IU]/L (ref 0.40–4.50)

## 2023-07-07 ENCOUNTER — Encounter: Payer: Self-pay | Admitting: Internal Medicine

## 2023-07-07 ENCOUNTER — Telehealth: Payer: Self-pay | Admitting: Nurse Practitioner

## 2023-07-07 ENCOUNTER — Ambulatory Visit: Payer: BC Managed Care – PPO | Admitting: Internal Medicine

## 2023-07-07 VITALS — BP 120/70 | HR 66 | Temp 97.9°F | Ht 62.0 in | Wt 143.6 lb

## 2023-07-07 DIAGNOSIS — M546 Pain in thoracic spine: Secondary | ICD-10-CM

## 2023-07-07 MED ORDER — IBUPROFEN 800 MG PO TABS: 800.0000 mg | ORAL_TABLET | Freq: Three times a day (TID) | ORAL | 0 refills | Status: DC | PRN

## 2023-07-07 MED ORDER — METHOCARBAMOL 500 MG PO TABS: 500.0000 mg | ORAL_TABLET | Freq: Three times a day (TID) | ORAL | 0 refills | Status: DC | PRN

## 2023-07-07 MED ORDER — KETOROLAC TROMETHAMINE 60 MG/2ML IM SOLN
60.0000 mg | Freq: Once | INTRAMUSCULAR | Status: AC
Start: 2023-07-07 — End: 2023-07-07
  Administered 2023-07-07: 60 mg via INTRAMUSCULAR

## 2023-07-07 NOTE — Telephone Encounter (Signed)
Please have note state "no heavy lifting for the next 3 days"

## 2023-07-07 NOTE — Progress Notes (Signed)
[facility] [address] [place] [address] Dept: [phone] Dept Fax: [phone]  Acute Care Office Visit  Subjective:   Paige Foster 1966-05-30 07/07/2023  Chief Complaint  Patient presents with   Back Pain    Started last week     HPI: Discussed the use of AI scribe software for clinical note transcription with the patient, who gave verbal consent to proceed.  History of Present Illness         The patient, with a history of osteopenia, presents with a week-long history of upper right back pain. The pain is described as 'harsh' and is located in right mid back. The patient suspects the pain may be due to a strain from pushing mattresses upstairs a couple of weeks ago. Over-the-counter ibuprofen and Tylenol have not provided relief. The patient denies neck pain but reports pain in right side of groin that radiates upwards to R. mid back. The patient denies any lower back pain, numbness/tingling in LE's, or bowel/bladder incontinence. Denies any recent falls or injuries.     The following portions of the patient's history were reviewed and updated as appropriate: past medical history, past surgical history, family history, social history, allergies, medications, and problem list.   Patient Active Problem List   Diagnosis Date Noted   Bulging eyes 07/04/2023   Use of anastrozole (Arimidex) 07/04/2023   Pure hypercholesterolemia 11/17/2021   Anemia 11/17/2021   Osteopenia 08/16/2019   Genetic testing 02/05/2019   Port-A-Cath in place 09/28/2018   Recurrent breast cancer, left (HCC) 09/07/2018   Malignant neoplasm of upper-outer quadrant of left breast in female, estrogen receptor positive (HCC) 09/06/2018   HTN (hypertension) 07/10/2013   HEMORRHOIDS-INTERNAL 04/23/2009   History of colonic polyps 04/23/2009   HEMORRHOID, THROMBOSED 03/12/2009   Past Medical History:  Diagnosis Date   Anemia    History  of blood transfusion 2004   History of colon polyps    Hypertension    Neuromuscular disorder (HCC)    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    Recurrent breast cancer, left [facility]) oncologist-- dr Darnelle Catalan    dx 2004, noninvasive Stage 0 ----s/p left mastectomy w/ tram flap construction (and right breast reduction), taken Tamoxifen for 3 yrs;   08-30-2018 recurrent left cancer , Grade III,  cT1c,  ER positive, PR negative, HER-2 positive, invasive ductal carcinoma-- neoadjuvant chemo to start 09-26-2018   Renal artery stenosis (HCC)    mild right external renal artery stenosis per duplex in epic 08-09-2013   Wears glasses    Past Surgical History:  Procedure Laterality Date   BREAST LUMPECTOMY WITH RADIOACTIVE SEED LOCALIZATION Left 02/20/2019   Procedure: LEFT BREAST LUMPECTOMY WITH RADIOACTIVE SEED LOCALIZATION;  Surgeon: Emelia Loron, MD;  Location: [facility] OR;  Service: General;  Laterality: Left;   BREAST SURGERY Left    Transflap   COLONOSCOPY     COLONOSCOPY     MASTECTOMY Left 2004   w/  TRAM flap construction and right breast augmentation with abdominoplasy   PARS PLANA VITRECTOMY Left 12/18/2018   Procedure: PARS PLANA VITRECTOMY WITH 25 GAUGE, ENDOLASER;  Surgeon: Carmela Rima, MD;  Location: [facility] OR;  Service: Ophthalmology;  Laterality: Left;   PORTACATH PLACEMENT N/A 09/25/2018   Procedure: INSERTION PORT-A-CATH WITH ULTRASOUND;  Surgeon: Emelia Loron, MD;  Location: [facility] ORS;  Service: General;  Laterality: N/A;   TUBAL LIGATION Bilateral yrs ago   Family History  Problem Relation Age of Onset   Diabetes  Mother    Hypertension Mother    Kidney disease Father    Colon cancer Neg Hx    Colon polyps Neg Hx    Gallbladder disease Neg Hx    Heart disease Neg Hx    Esophageal cancer Neg Hx    Stomach cancer Neg Hx    Rectal cancer Neg Hx     Current Outpatient Medications:    alendronate (FOSAMAX) 70 MG tablet, Take 1 tablet (70 mg total) by mouth every 7 (seven) days. Take with a  full glass of water on an empty stomach., Disp: 12 tablet, Rfl: 3   amLODipine (NORVASC) 10 MG tablet, Take 1 tablet by mouth daily., Disp: 90 tablet, Rfl: 2   anastrozole (ARIMIDEX) 1 MG tablet, Take 1 tablet (1 mg total) by mouth daily., Disp: 90 tablet, Rfl: 4   atorvastatin (LIPITOR) 20 MG tablet, Take 1 tablet (20 mg total) by mouth daily., Disp: 90 tablet, Rfl: 3   brimonidine (ALPHAGAN) 0.15 % ophthalmic solution, 1 drop 2 (two) times daily., Disp: , Rfl:    brimonidine (ALPHAGAN) 0.2 % ophthalmic solution, SMARTSIG:In Eye(s), Disp: , Rfl:    dorzolamide-timolol (COSOPT) 2-0.5 % ophthalmic solution, 1 drop 2 (two) times daily., Disp: , Rfl:    ibuprofen (ADVIL) 800 MG tablet, Take 1 tablet (800 mg total) by mouth every 8 (eight) hours as needed (pain). Take with food., Disp: 30 tablet, Rfl: 0   losartan (COZAAR) 50 MG tablet, TAKE 1 TABLET(50 MG) BY MOUTH DAILY, Disp: 90 tablet, Rfl: 3   methocarbamol (ROBAXIN) 500 MG tablet, Take 1 tablet (500 mg total) by mouth every 8 (eight) hours as needed for muscle spasms., Disp: 30 tablet, Rfl: 0   prednisoLONE acetate (PRED FORTE) 1 % ophthalmic suspension, Place 1 drop into the left eye 4 (four) times daily. (Patient not taking: Reported on 07/04/2023), Disp: , Rfl:  No Known Allergies   ROS: A complete ROS was performed with pertinent positives/negatives noted in the HPI. The remainder of the ROS are negative.    Objective:   Today's Vitals   07/07/23 1551  BP: 120/70  Pulse: 66  Temp: 97.9 F (36.6 C)  TempSrc: Temporal  SpO2: 98%  Weight: 143 lb 9.6 oz (65.1 kg)  Height: 5\' 2"  (1.575 m)    GENERAL: Well-appearing, in NAD. Well nourished.  SKIN: Pink, warm and dry. No rash, lesion, ulceration, or ecchymoses.  NECK: Trachea midline. Full ROM w/o pain or tenderness. No lymphadenopathy.  RESPIRATORY: Chest wall symmetrical. Respirations even and non-labored. Breath sounds clear to auscultation bilaterally.  CARDIAC: S1, S2 present,  regular rate and rhythm. Peripheral pulses 2+ bilaterally.  MSK: Muscle tone and strength appropriate for age. TTP to right thoracic region. No obvious deformities.  EXTREMITIES: Without clubbing, cyanosis, or edema.  NEUROLOGIC: No motor or sensory deficits. Steady, even gait.  PSYCH/MENTAL STATUS: Alert, oriented x 3. Cooperative, appropriate mood and affect.    No results found for any visits on 07/07/23.    Assessment & Plan:  Assessment and Plan      Acute Upper Back Pain Likely muscle strain from heavy lifting (pushing mattresses upstairs) two weeks ago. Pain localized to the upper right back, no radicular symptoms, no bowel or bladder incontinence. Osteopenia present but no history of trauma or extreme coughing fits to suggest fracture. -Administer Toradol injection today for pain and inflammation. -Prescribe muscle relaxant and ibuprofen. -Advise continued use of over-the-counter lidocaine patches and heat application (heating pad or warm bath  with Epsom salt). -Reevaluate if symptoms persist or worsen.      Meds ordered this encounter  Medications   ketorolac (TORADOL) injection 60 mg   methocarbamol (ROBAXIN) 500 MG tablet    Sig: Take 1 tablet (500 mg total) by mouth every 8 (eight) hours as needed for muscle spasms.    Dispense:  30 tablet    Refill:  0    Supervising Provider:   Garnette Gunner [[id number]]   ibuprofen (ADVIL) 800 MG tablet    Sig: Take 1 tablet (800 mg total) by mouth every 8 (eight) hours as needed (pain). Take with food.    Dispense:  30 tablet    Refill:  0    Supervising Provider:   Garnette Gunner [[id number]]   No orders of the defined types were placed in this encounter.  Lab Orders  No laboratory test(s) ordered today   No images are attached to the encounter or orders placed in the encounter.  Return if symptoms worsen or fail to improve.   Paige Decent, FNP

## 2023-07-07 NOTE — Patient Instructions (Signed)
Apply Lidocaine patches to back as directed on packaging  Epsom salt baths  Heating pad  Rest, try to avoid heavy lifting  Take ibuprofen and muscle relaxant as directed

## 2023-07-07 NOTE — Telephone Encounter (Signed)
Work Physicist, medical in Clinical cytogeneticist

## 2023-07-07 NOTE — Telephone Encounter (Signed)
Pt was seen today 07/07/23 for Acute right-sided thoracic back pain by Morrie Sheldon. She is needing a note stating no heavy lifting, she is not sure for how many weeks. She would like to come by tomorrow 07/08/23 and pick this up before the weekend. If any questions pt at [phone].

## 2023-07-08 ENCOUNTER — Other Ambulatory Visit: Payer: Self-pay | Admitting: *Deleted

## 2023-07-08 DIAGNOSIS — C50412 Malignant neoplasm of upper-outer quadrant of left female breast: Secondary | ICD-10-CM

## 2023-07-08 DIAGNOSIS — D649 Anemia, unspecified: Secondary | ICD-10-CM

## 2023-07-11 ENCOUNTER — Inpatient Hospital Stay: Payer: BC Managed Care – PPO

## 2023-07-11 ENCOUNTER — Other Ambulatory Visit: Payer: Self-pay

## 2023-07-11 ENCOUNTER — Inpatient Hospital Stay: Payer: BC Managed Care – PPO | Attending: Hematology and Oncology | Admitting: Hematology and Oncology

## 2023-07-11 VITALS — BP 141/78 | HR 60 | Temp 98.0°F | Resp 16 | Wt 145.2 lb

## 2023-07-11 DIAGNOSIS — Z79899 Other long term (current) drug therapy: Secondary | ICD-10-CM | POA: Insufficient documentation

## 2023-07-11 DIAGNOSIS — C50412 Malignant neoplasm of upper-outer quadrant of left female breast: Secondary | ICD-10-CM

## 2023-07-11 DIAGNOSIS — Z9013 Acquired absence of bilateral breasts and nipples: Secondary | ICD-10-CM | POA: Insufficient documentation

## 2023-07-11 DIAGNOSIS — M8580 Other specified disorders of bone density and structure, unspecified site: Secondary | ICD-10-CM | POA: Diagnosis not present

## 2023-07-11 DIAGNOSIS — Z170 Estrogen receptor positive status [ER+]: Secondary | ICD-10-CM | POA: Insufficient documentation

## 2023-07-11 DIAGNOSIS — D649 Anemia, unspecified: Secondary | ICD-10-CM

## 2023-07-11 DIAGNOSIS — Z79811 Long term (current) use of aromatase inhibitors: Secondary | ICD-10-CM | POA: Diagnosis not present

## 2023-07-11 LAB — CMP (CANCER CENTER ONLY)
ALT: 43 U/L (ref 0–44)
AST: 41 U/L (ref 15–41)
Albumin: 4.4 g/dL (ref 3.5–5.0)
Alkaline Phosphatase: 97 U/L (ref 38–126)
Anion gap: 7 (ref 5–15)
BUN: 14 mg/dL (ref 6–20)
CO2: 26 mmol/L (ref 22–32)
Calcium: 9.9 mg/dL (ref 8.9–10.3)
Chloride: 104 mmol/L (ref 98–111)
Creatinine: 0.73 mg/dL (ref 0.44–1.00)
GFR, Estimated: >60 mL/min (ref >60)
Glucose, Bld: 95 mg/dL (ref 70–99)
Potassium: 3.2 mmol/L — ABNORMAL LOW (ref 3.5–5.1)
Sodium: 137 mmol/L (ref 135–145)
Total Bilirubin: 0.3 mg/dL (ref <1.2)
Total Protein: 7.5 g/dL (ref 6.5–8.1)

## 2023-07-11 LAB — IRON AND IRON BINDING CAPACITY (CC-WL,HP ONLY)
Iron: 96 ug/dL (ref 28–170)
Saturation Ratios: 25 % (ref 10.4–31.8)
TIBC: 382 ug/dL (ref 250–450)
UIBC: 286 ug/dL (ref 148–442)

## 2023-07-11 LAB — CBC WITH DIFFERENTIAL (CANCER CENTER ONLY)
Abs Immature Granulocytes: 0.01 10*3/uL (ref 0.00–0.07)
Basophils Absolute: 0 10*3/uL (ref 0.0–0.1)
Basophils Relative: 0 %
Eosinophils Absolute: 0 10*3/uL (ref 0.0–0.5)
Eosinophils Relative: 0 %
HCT: 35.4 % — ABNORMAL LOW (ref 36.0–46.0)
Hemoglobin: 12.1 g/dL (ref 12.0–15.0)
Immature Granulocytes: 0 %
Lymphocytes Relative: 30 %
Lymphs Abs: 2.1 10*3/uL (ref 0.7–4.0)
MCH: 31.9 pg (ref 26.0–34.0)
MCHC: 34.2 g/dL (ref 30.0–36.0)
MCV: 93.4 fL (ref 80.0–100.0)
Monocytes Absolute: 0.7 10*3/uL (ref 0.1–1.0)
Monocytes Relative: 10 %
Neutro Abs: 4.2 10*3/uL (ref 1.7–7.7)
Neutrophils Relative %: 60 %
Platelet Count: 311 10*3/uL (ref 150–400)
RBC: 3.79 MIL/uL — ABNORMAL LOW (ref 3.87–5.11)
RDW: 12.8 % (ref 11.5–15.5)
WBC Count: 7 10*3/uL (ref 4.0–10.5)
nRBC: 0 % (ref 0.0–0.2)

## 2023-07-11 LAB — FERRITIN: Ferritin: 261 ng/mL (ref 11–307)

## 2023-07-11 NOTE — Progress Notes (Signed)
[name]  [phone] [phone]    ID: Paige SCIPIO DOB: 02/01/1966  MR#: [id number]  HYQ#:[id number]  Patient Care Team: Anne Ng, NP as PCP - General (Internal Medicine) Magrinat, Valentino Hue, MD (Inactive) as Consulting Physician (Oncology) Emelia Loron, MD as Consulting Physician (General Surgery) Nche, Bonna Gains, NP as Nurse Practitioner (Internal Medicine) Carmela Rima, MD as Consulting Physician (Ophthalmology) Laurey Morale, MD as Consulting Physician (Cardiology) Louisa Second, MD (Inactive) as Consulting Physician (Plastic Surgery) Sherrilyn Rist, MD as Consulting Physician (Gastroenterology) Shea Evans Genice Rouge [facility]) OTHER MD:    CHIEF COMPLAINT: 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  History of Present Illness    The patient, with a history of osteopenia and breast cancer, presents with leg pain. She attributes the pain to new, heavy, steel-toed work shoes. The pain has improved with rest and she plans to switch back to lighter shoes. She also reports a healed rib fracture and muscle spasm, for which she is taking ibuprofen and a muscle relaxant.  The patient is on anastrozole for breast cancer and Fosamax for osteopenia. She reports dental problems and bone loss, but her dentist is unaware she is taking Fosamax.  The patient is also due for a bone density test in June and will complete five years of anastrozole in December of the following year.  Rest of the pertinent 10 point ROS reviewed and neg.  HISTORY OF CURRENT ILLNESS: From the original intake note:  Paige Foster has a prior history of left breast cancer, dating back to 2004. At that time she underwent a left mastectomy for stage  0 (noninvasive) breast cancer, with transverse rectus abdominis (TRAM) flap construction under Dr. Shon Hough. She also underwent a right breast reduction. She took tamoxifen for three years.  More recently she underwent bilateral diagnostic mammography with tomography and left breast ultrasonography at [facility] on 01/03/2018 showing: Breast Density Category B. There is an oval fat containing lesion in the left breast upper outer quadrant posterior depth. No other significant masses, calcifications, or other findings are seen in either breast. Sonographically, there is a 1.5 cm lesion in the left breast upper outer quadrant posterior depth. This lesion is of mixed echogenicity. This correlates as palpated and with mammography findings. Follow up was recommended.  Close follow-up was suggested.  She then presented with a non-tender mass in the left reconstructed breast on 08/30/2018. On physical exam, there is a hard palpable lump measuring 2.0 cm in the upper outer left reconstructed breast 10 cm from the expected location of a nipple. Sonography over this area demonstrates a 1.8 cm x 1.7 cm x 1.4 cm mass in the left breast at 2 o'clock posterior depth 10 cm from the nipple. This mass is of mixed echogenicity. This abnormality is increased in size and correlates as palpated and with prior mammography findings. Color flow imaging demonstrates that there is vascularity present. Elastography imaging assessment is intermediate. No significant abnormalities were seen sonographically in the left axilla.    Accordingly on 08/30/2018 she proceeded to biopsy of the left breast mass in question. The pathology from this procedure showed ([id number]): invasive ductal carcinoma, grade III. Prognostic indicators significant for: estrogen receptor, 100% positive with strong staining intensity and progesterone receptor, 0% negative. Proliferation marker Ki67 at  15%. HER2 positive (3+) by immunohistochemistry.  The patient's  subsequent history is as detailed below.   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    Recurrent breast cancer, left [facility]) oncologist-- dr Darnelle Catalan    dx 2004, noninvasive Stage 0 ----s/p left mastectomy w/ tram flap construction (and right breast reduction), taken Tamoxifen for 3 yrs;   08-30-2018 recurrent left cancer , Grade III,  cT1c,  ER positive, PR negative, HER-2 positive, invasive ductal carcinoma-- neoadjuvant chemo to start 09-26-2018   Renal artery stenosis (HCC)    mild right external renal artery stenosis per duplex in epic 08-09-2013   Wears glasses     PAST SURGICAL HISTORY: Past Surgical History:  Procedure Laterality Date   BREAST LUMPECTOMY WITH RADIOACTIVE SEED LOCALIZATION Left 02/20/2019   Procedure: LEFT BREAST LUMPECTOMY WITH RADIOACTIVE SEED LOCALIZATION;  Surgeon: Emelia Loron, MD;  Location: [facility] OR;  Service: General;  Laterality: Left;   BREAST SURGERY Left    Transflap   COLONOSCOPY     COLONOSCOPY     MASTECTOMY Left 2004   w/  TRAM flap construction and right breast augmentation with abdominoplasy   PARS PLANA VITRECTOMY Left 12/18/2018   Procedure: PARS PLANA VITRECTOMY WITH 25 GAUGE, ENDOLASER;  Surgeon: Carmela Rima, MD;  Location: [facility] OR;  Service: Ophthalmology;  Laterality: Left;   PORTACATH PLACEMENT N/A 09/25/2018   Procedure: INSERTION PORT-A-CATH WITH ULTRASOUND;  Surgeon: Emelia Loron, MD;  Location: [facility]S;  Service: General;  Laterality: N/A;   TUBAL LIGATION Bilateral yrs ago    FAMILY HISTORY: Family History  Problem Relation Age of Onset   Diabetes Mother    Hypertension Mother    Kidney disease Father    Colon cancer Neg Hx    Colon polyps Neg Hx    Gallbladder disease Neg Hx    Heart disease Neg Hx    Esophageal cancer Neg Hx    Stomach cancer Neg Hx     Rectal cancer Neg Hx   Ireene's father died from unknown causes in his early 26's. Patients' mother died from diabetes complications at age 64. The patient has 1 sister. Patient denies anyone in her family having breast, ovarian, prostate, or pancreatic cancer.    GYNECOLOGIC HISTORY:  Patient's last menstrual period was 06/08/2007. 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 Her husband, Hewitt Shorts, works at [employer]. Janni has two children, Ivin Booty and Onalee Hua. Ivin Booty lives with her, is 69, and it attending [employer] for a computer based degree. Onalee Hua lives with her, is 2, and recently graduated from [employer] with a degree in Pension scheme manager.  He works for [employer]. Luz has no grandchildren. She attends the [employer].   ADVANCED DIRECTIVES: In the absence of any documents to the contrary her husband, Hewitt Shorts, is automatically her healthcare power of attorney     HEALTH MAINTENANCE: Social History   Tobacco Use   Smoking status: Never   Smokeless tobacco: Never  Vaping Use   Vaping status: Never Used  Substance Use Topics   Alcohol use: Not Currently    Alcohol/week: 0.0 standard  drinks of alcohol    Comment: Occassionally   Drug use: No    Colonoscopy: April 2022, danis  PAP: January 2022, Nche  Bone density:  2017; -1.6, osteopenic   No Known Allergies  Current Outpatient Medications  Medication Sig Dispense Refill   alendronate (FOSAMAX) 70 MG tablet Take 1 tablet (70 mg total) by mouth every 7 (seven) days. Take with a full glass of water on an empty stomach. 12 tablet 3   amLODipine (NORVASC) 10 MG tablet Take 1 tablet by mouth daily. 90 tablet 2   anastrozole (ARIMIDEX) 1 MG tablet Take 1 tablet (1 mg total) by mouth daily. 90 tablet 4   atorvastatin (LIPITOR) 20 MG tablet Take 1  tablet (20 mg total) by mouth daily. 90 tablet 3   brimonidine (ALPHAGAN) 0.15 % ophthalmic solution 1 drop 2 (two) times daily.     brimonidine (ALPHAGAN) 0.2 % ophthalmic solution SMARTSIG:In Eye(s)     dorzolamide-timolol (COSOPT) 2-0.5 % ophthalmic solution 1 drop 2 (two) times daily.     ibuprofen (ADVIL) 800 MG tablet Take 1 tablet (800 mg total) by mouth every 8 (eight) hours as needed (pain). Take with food. 30 tablet 0   losartan (COZAAR) 50 MG tablet TAKE 1 TABLET(50 MG) BY MOUTH DAILY 90 tablet 3   methocarbamol (ROBAXIN) 500 MG tablet Take 1 tablet (500 mg total) by mouth every 8 (eight) hours as needed for muscle spasms. 30 tablet 0   prednisoLONE acetate (PRED FORTE) 1 % ophthalmic suspension Place 1 drop into the left eye 4 (four) times daily. (Patient not taking: Reported on 07/04/2023)     No current facility-administered medications for this visit.     OBJECTIVE: African-American woman who appears stated age  There were no vitals filed for this visit.     There is no height or weight on file to calculate BMI.   Wt Readings from Last 3 Encounters:  07/07/23 143 lb 9.6 oz (65.1 kg)  07/04/23 142 lb 3.2 oz (64.5 kg)  10/27/22 154 lb (69.9 kg)   NECK: No lymphadenopathy noted. CHEST: Lungs clear to auscultation. CARDIOVASCULAR: Heart sounds normal. ABDOMEN: Abdomen non-tender on palpation.   LAB RESULTS:  CMP     Component Value Date/Time   NA 138 07/04/2023 1010   NA 140 10/12/2017 0950   K 3.7 07/04/2023 1010   CL 102 07/04/2023 1010   CO2 27 07/04/2023 1010   GLUCOSE 95 07/04/2023 1010   BUN 13 07/04/2023 1010   BUN 10 10/12/2017 0950   CREATININE 0.70 07/04/2023 1010   CREATININE 0.66 06/02/2022 1105   CALCIUM 9.6 07/04/2023 1010   PROT 7.3 07/04/2023 1010   PROT 7.5 10/12/2017 0950   ALBUMIN 4.1 07/04/2023 1010   ALBUMIN 4.3 10/12/2017 0950   AST 31 07/04/2023 1010   AST 15 06/02/2022 1105   ALT 29 07/04/2023 1010   ALT 11 06/02/2022 1105    ALKPHOS 92 07/04/2023 1010   BILITOT 0.4 07/04/2023 1010   BILITOT 0.3 06/02/2022 1105   GFRNONAA >60 06/02/2022 1105   GFRAA >60 12/06/2019 1441   GFRAA >60 09/07/2018 1455   Lab Results  Component Value Date   WBC 7.0 07/11/2023   NEUTROABS 4.2 07/11/2023   HGB 12.1 07/11/2023   HCT 35.4 (L) 07/11/2023   MCV 93.4 07/11/2023   PLT 311 07/11/2023    Lab Results  Component Value Date   LABCA2 <4 08/30/2007    No components found for: "QMVHQI696"  No  results for input(s): "INR" in the last 168 hours.  Lab Results  Component Value Date   LABCA2 <4 08/30/2007    No results found for: "CAN199"  No results found for: "CAN125"  No results found for: "CAN153"  No results found for: "CA2729"  No components found for: "HGQUANT"  No results found for: "CEA1", "CEA" / No results found for: "CEA1", "CEA"  No results found for: "AFPTUMOR"  No results found for: "CHROMOGRNA"  No results found for: "TOTALPROTELP", "ALBUMINELP", "A1GS", "A2GS", "BETS", "BETA2SER", "GAMS", "MSPIKE", "SPEI" (this displays SPEP labs)  No results found for: "KPAFRELGTCHN", "LAMBDASER", "KAPLAMBRATIO" (kappa/lambda light chains)  No results found for: "HGBA", "HGBA2QUANT", "HGBFQUANT", "HGBSQUAN" (Hemoglobinopathy evaluation)   Lab Results  Component Value Date   LDH 169 08/30/2007    Lab Results  Component Value Date   IRON 56 06/02/2022   TIBC 431 06/02/2022   IRONPCTSAT 13 06/02/2022   (Iron and TIBC)  Lab Results  Component Value Date   FERRITIN 98 06/02/2022    Urinalysis    Component Value Date/Time   LABSPEC 1.015 04/09/2008 1548   PHURINE 7.0 04/09/2008 1548   HGBUR large 04/09/2008 1548   BILIRUBINUR negative 04/09/2008 1548   UROBILINOGEN 0.2 04/09/2008 1548   NITRITE negative 04/09/2008 1548    STUDIES:  No results found.   ELIGIBLE FOR AVAILABLE RESEARCH PROTOCOL: No  ASSESSMENT: 57 y.o. [place], [place] woman  (1) history of left-sided ductal carcinoma  in situ 2004  (a) s/p left mastectomy with TRAM reconstruction  (b) status post tamoxifen x3 years  (2) left breast upper outer quadrant biopsy 08/30/2018 shows a clinical T1c N0 invasive ductal carcinoma, grade 3, estrogen receptor strongly positive, progesterone receptor negative, with HER-2 amplification, and and MIB-1 of 15%.   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    (b) chemotherapy discontinued after 4 cycles because of intercurrent eye surgery  (c) continued trastuzumab and pertuzumab to complete a year (last dose 09/06/2019)  (d) echocardiogram on 01/23/2019 that shows well preserved EF of 60-65%  (e) echocardiogram on 04/23/2019 shows EF of 60-65%  (f) echocardiogram 08/02/2019 shows an ejection fraction in the 60-65% range  (4) left lumpectomy 02/20/2019 showed a residual  ypT1c NX invasive ductal carcinoma, grade 2 with negative margins.    (5) adjuvant radiation: Radiation Treatment Dates: 04/12/2019 through 05/28/2019 Site Technique Total Dose (Gy) Dose per Fx (Gy) Completed Fx Beam Energies  Breast: CW_Lt 3D 50.4/50.4 1.8 28/28 6X, 10X  Breast: CW_Lt_SCV_PAB 3D 50.4/50.4 1.8 28/28 6X, 10X  Breast: CW_Lt_Bst Electron 10/10 2 5/5 6X, 10X   (6) anastrozole started 06/26/2019  (a) DEXA scan at [facility] 12/24/2015 found a T score of -1.6 DEXA scan from mass June 2023 showed T score of -1.4 at L1-L2  (7) genetics testing 02/03/2019 through the Common Hereditary Cancers Panel offered by [facility] found no deleterious mutations in APC, ATM, AXIN2, BARD1, BMPR1A, BRCA1, BRCA2, BRIP1, CDH1, CDKN2A (p14ARF), CDKN2A (p16INK4a), CKD4, CHEK2, CTNNA1, DICER1, EPCAM  (Deletion/duplication testing only), GREM1 (promoter region deletion/duplication testing only), KIT, MEN1, MLH1, MSH2, MSH3, MSH6, MUTYH, NBN, NF1, NHTL1, PALB2, PDGFRA, PMS2, POLD1, POLE, PTEN, RAD50, RAD51C, RAD51D, RNF43, SDHB, SDHC, SDHD, SMAD4, SMARCA4. STK11, TP53, TSC1, TSC2, and VHL.  The following genes were evaluated for sequence changes only: SDHA and HOXB13 c.251G>A variant only.   PLAN:  Lower extremity pain Likely related to heavy work shoes. Improved with rest and change of shoes. -Advise to continue using lighter shoes for work.  Breast Cancer On Anastrozole, nearing completion of 5-year course. Recent mammogram in November was normal. -Continue Anastrozole daily until completion of 5-year course in 2025. -Repeat mammogram in Nov 2025.  Osteopenia On Fosamax. Recent dental issues and reported bone loss. -Hold Fosamax until dental evaluation in February 2025. -Next bone density due in June 2025. -Discuss with her dentist about future use of fosamax.   Follow-up in 2025 for routine care and to discuss discontinuation of Anastrozole.   Rachel Moulds, MD  Medical Oncology and Hematology [facility] [address] [place], [place] [address] Tel. [phone]    Fax. [phone]  Total time spent, 20 minutes  *Total Encounter Time as defined by the Centers for Medicare and Medicaid Services includes, in addition to the face-to-face time of a patient visit (documented in the note above) non-face-to-face time: obtaining and reviewing outside history, ordering and reviewing medications, tests or procedures, care coordination (communications with other health care professionals or caregivers) and documentation in the medical record.

## 2023-07-17 DIAGNOSIS — M79604 Pain in right leg: Secondary | ICD-10-CM | POA: Diagnosis not present

## 2023-07-17 DIAGNOSIS — S76911A Strain of unspecified muscles, fascia and tendons at thigh level, right thigh, initial encounter: Secondary | ICD-10-CM | POA: Diagnosis not present

## 2023-07-18 ENCOUNTER — Ambulatory Visit (INDEPENDENT_AMBULATORY_CARE_PROVIDER_SITE_OTHER): Payer: BC Managed Care – PPO

## 2023-07-18 ENCOUNTER — Encounter: Payer: Self-pay | Admitting: Family Medicine

## 2023-07-18 ENCOUNTER — Ambulatory Visit: Payer: Self-pay | Admitting: Nurse Practitioner

## 2023-07-18 ENCOUNTER — Telehealth: Payer: Self-pay | Admitting: Nurse Practitioner

## 2023-07-18 ENCOUNTER — Ambulatory Visit: Payer: BC Managed Care – PPO | Admitting: Family Medicine

## 2023-07-18 ENCOUNTER — Other Ambulatory Visit: Payer: Self-pay | Admitting: Family Medicine

## 2023-07-18 VITALS — BP 130/80 | HR 59 | Temp 97.3°F | Ht 62.0 in | Wt 144.0 lb

## 2023-07-18 DIAGNOSIS — M25551 Pain in right hip: Secondary | ICD-10-CM

## 2023-07-18 DIAGNOSIS — M1611 Unilateral primary osteoarthritis, right hip: Secondary | ICD-10-CM | POA: Diagnosis not present

## 2023-07-18 MED ORDER — PREDNISONE 20 MG PO TABS: 20.0000 mg | ORAL_TABLET | Freq: Every day | ORAL | 0 refills | Status: DC

## 2023-07-18 NOTE — Telephone Encounter (Signed)
  Chief Complaint: right leg weakness Symptoms: pain 10/10 hard to walk Frequency: constant  Disposition: [] ED /[] Urgent Care (no appt availability in office) / [x] Appointment(In office/virtual)/ []  [facility]/ [] Home Care/ [] Refused Recommended Disposition /[] [facility]/ []  Follow-up with PCP Additional Notes: Pt has noticed an increase of right-sided weakness over the last 2 weeks. Pt went to urgent care 12/22 due to pain and weakness. Area of concern is on right side of body particularity groin and buttocks area.  Dr told pt he thought it was related to a pulled muscle and not a bone issue due to she can wiggle toes and place pressure.  No x-ray performed.  Pt denies any known accident and is unsure of cause. Pt stated she was given a shot yesterday, but it isn't helping. Pt has appt at 10:20 with provider. Pt understands all care.          Copied from CRM [id number]. Topic: Clinical - Red Word Triage >> Jul 18, 2023  8:29 AM Paige Foster wrote: Red Word that prompted transfer to Nurse Triage: Unable to walk on right side. Severe pain Reason for Disposition  [1] MODERATE weakness (i.e., interferes with work, school, normal activities) AND [2] cause unknown  (Exceptions: Weakness from acute minor illness or poor fluid intake; weakness is chronic and not worse.)  Answer Assessment - Initial Assessment Questions 1. DESCRIPTION: "Describe how you are feeling."     Feels fine except for walking on right leg 2. SEVERITY: "How bad is it?"  "Can you stand and walk?"   - MILD (0-3): Feels weak or tired, but does not interfere with work, school or normal activities.   - MODERATE (4-7): Able to stand and walk; weakness interferes with work, school, or normal activities.   - SEVERE (8-10): Unable to stand or walk; unable to do usual activities.     10- keeps pt from walking 3. ONSET: "When did these symptoms begin?" (e.g., hours, days, weeks, months)     2 weeks ago but  getting worse 4. CAUSE: "What do you think is causing the weakness or fatigue?" (e.g., not drinking enough fluids, medical problem, trouble sleeping)     Not sure 5. NEW MEDICINES:  "Have you started on any new medicines recently?" (e.g., opioid pain medicines, benzodiazepines, muscle relaxants, antidepressants, antihistamines, neuroleptics, beta blockers)     Muscle relxer 6. OTHER SYMPTOMS: "Do you have any other symptoms?" (e.g., chest pain, fever, cough, SOB, vomiting, diarrhea, bleeding, other areas of pain)     Denies all  Protocols used: Weakness (Generalized) and Fatigue-A-AH

## 2023-07-18 NOTE — Telephone Encounter (Signed)
Patient seen in the clinic today. Dm/cma

## 2023-07-18 NOTE — Telephone Encounter (Signed)
Yes that is okay  

## 2023-07-18 NOTE — Telephone Encounter (Signed)
Forwarding patient phone call below. Please advise.   Copied from CRM [id number]. Topic: Clinical - Medication Question >> Jul 18, 2023  4:04 PM Adaysia C wrote: Reason for CRM: Patient asked if she can continue taking her inflammatory medication with the newest medication that was prescribed. Please follow up with PT [phone]

## 2023-07-18 NOTE — Telephone Encounter (Signed)
Copied from CRM [id number]. Topic: General - Other >> Jul 18, 2023  3:56 PM Paige Foster wrote: Reason for CRM: Patient has requested a work excuse after being seen at her appointment on 07/18/2023 at 10:20a. Please follow up with PT for how many days she will need to be written out of work [phone]

## 2023-07-18 NOTE — Progress Notes (Signed)
[facility] [address] [place] [address] Dept: [phone] Dept Fax: [phone]  Office Visit  Subjective:    Patient ID: Paige Foster, female    DOB: Mar 27, 1966, 57 y.o..   MRN: [id number]  Chief Complaint  Patient presents with   Leg Pain    C/o having RT leg painweakness x 1 week.     History of Present Illness:  Patient is in today for reassessment of her right buttocks/groin pain. She notes this starting bothering her about a week ago. She denies any known injury. She finds putting weight on this leg causes her pain. She tried using ibuprofen and Robaxin. She was seen in UC yesterday. She notes she was given a Toradol injection and started on oral Toradol 100 mg qid. She has not had any relief at present.  Past Medical History: Patient Active Problem List   Diagnosis Date Noted   Bulging eyes 07/04/2023   Use of anastrozole (Arimidex) 07/04/2023   Pure hypercholesterolemia 11/17/2021   Anemia 11/17/2021   Osteopenia 08/16/2019   Genetic testing 02/05/2019   Port-A-Cath in place 09/28/2018   Recurrent breast cancer, left (HCC) 09/07/2018   Malignant neoplasm of upper-outer quadrant of left breast in female, estrogen receptor positive (HCC) 09/06/2018   HTN (hypertension) 07/10/2013   HEMORRHOIDS-INTERNAL 04/23/2009   History of colonic polyps 04/23/2009   HEMORRHOID, THROMBOSED 03/12/2009   Past Surgical History:  Procedure Laterality Date   BREAST LUMPECTOMY WITH RADIOACTIVE SEED LOCALIZATION Left 02/20/2019   Procedure: LEFT BREAST LUMPECTOMY WITH RADIOACTIVE SEED LOCALIZATION;  Surgeon: Emelia Loron, MD;  Location: [facility] OR;  Service: General;  Laterality: Left;   BREAST SURGERY Left    Transflap   COLONOSCOPY     COLONOSCOPY     MASTECTOMY Left 2004   w/  TRAM flap construction and right breast augmentation with abdominoplasy   PARS PLANA VITRECTOMY Left 12/18/2018   Procedure: PARS PLANA  VITRECTOMY WITH 25 GAUGE, ENDOLASER;  Surgeon: Carmela Rima, MD;  Location: [facility] OR;  Service: Ophthalmology;  Laterality: Left;   PORTACATH PLACEMENT N/A 09/25/2018   Procedure: INSERTION PORT-A-CATH WITH ULTRASOUND;  Surgeon: Emelia Loron, MD;  Location: [facility] ORS;  Service: General;  Laterality: N/A;   TUBAL LIGATION Bilateral yrs ago   Family History  Problem Relation Age of Onset   Diabetes Mother    Hypertension Mother    Kidney disease Father    Colon cancer Neg Hx    Colon polyps Neg Hx    Gallbladder disease Neg Hx    Heart disease Neg Hx    Esophageal cancer Neg Hx    Stomach cancer Neg Hx    Rectal cancer Neg Hx    Outpatient Medications Prior to Visit  Medication Sig Dispense Refill   alendronate (FOSAMAX) 70 MG tablet Take 1 tablet (70 mg total) by mouth every 7 (seven) days. Take with a full glass of water on an empty stomach. 12 tablet 3   amLODipine (NORVASC) 10 MG tablet Take 1 tablet by mouth daily. 90 tablet 2   anastrozole (ARIMIDEX) 1 MG tablet Take 1 tablet (1 mg total) by mouth daily. 90 tablet 4   atorvastatin (LIPITOR) 20 MG tablet Take 1 tablet (20 mg total) by mouth daily. 90 tablet 3   brimonidine (ALPHAGAN) 0.15 % ophthalmic solution 1 drop 2 (two) times daily.     brimonidine (ALPHAGAN) 0.2 % ophthalmic solution SMARTSIG:In Eye(s)     dorzolamide-timolol (COSOPT) 2-0.5 % ophthalmic  solution 1 drop 2 (two) times daily.     ibuprofen (ADVIL) 800 MG tablet Take 1 tablet (800 mg total) by mouth every 8 (eight) hours as needed (pain). Take with food. 30 tablet 0   ketorolac (TORADOL) 10 MG tablet Take 10 mg by mouth 4 (four) times daily.     losartan (COZAAR) 50 MG tablet TAKE 1 TABLET(50 MG) BY MOUTH DAILY 90 tablet 3   methocarbamol (ROBAXIN) 500 MG tablet Take 1 tablet (500 mg total) by mouth every 8 (eight) hours as needed for muscle spasms. 30 tablet 0   prednisoLONE acetate (PRED FORTE) 1 % ophthalmic suspension Place 1 drop into the left eye 4 (four)  times daily. (Patient not taking: Reported on 07/04/2023)     No facility-administered medications prior to visit.   No Known Allergies   Objective:   Today's Vitals   07/18/23 1022  BP: 130/80  Pulse: (!) 59  Temp: (!) 97.3 F (36.3 C)  TempSrc: Temporal  SpO2: 100%  Weight: 144 lb (65.3 kg)  Height: 5\' 2"  (1.575 m)   Body mass index is 26.34 kg/m.   General: Well developed, well nourished. No acute distress. Back: Straight. No tenderness over lumbar spine or paralumbar muscles. Extremities: Pain with weight bearing. Pain noted in buttocks and along the inguinal area into the groin. - SLR. Strength 5/5 in RLE. Normal sensation. Increased pain with figure-4.  Psych: Alert and oriented. Normal mood and affect.  Health Maintenance Due  Topic Date Due   Zoster Vaccines- Shingrix (1 of 2) 08/20/1983   DTaP/Tdap/Td (2 - Td or Tdap) 07/11/2023   Imaging: Right hip x-ray- Normal appearance without fracture, dislocation, or sign of arthritis.    Assessment & Plan:   Problem List Items Addressed This Visit   None Visit Diagnoses       Acute pain of right hip    -  Primary   In light of the normal x-ray, suspect this may be more of a sciatica. I recommend we try a course of prednisone. I also will provide crutches.   Relevant Orders   DME Crutches       Return in 1 week (on 07/25/2023), or if symptoms worsen or fail to improve, for Reassessment with PCP.   Loyola Mast, MD

## 2023-07-18 NOTE — Telephone Encounter (Signed)
Letter was written and place up front for pick up.  Patient was already aware. Dm/cma

## 2023-07-21 ENCOUNTER — Encounter: Payer: Self-pay | Admitting: Nurse Practitioner

## 2023-07-21 ENCOUNTER — Telehealth: Payer: Self-pay

## 2023-07-21 NOTE — Telephone Encounter (Signed)
Patient called asking about getting refill on the Toradol that was given by Urgent Care.  Advised her that Dr Veto Kemps is out of the office till Monday.  She will use the Ibuprofen 800 mg till her f/u appointment on 07/26/23.  Dm/cma

## 2023-07-22 DIAGNOSIS — M25551 Pain in right hip: Secondary | ICD-10-CM | POA: Diagnosis not present

## 2023-07-26 ENCOUNTER — Ambulatory Visit (INDEPENDENT_AMBULATORY_CARE_PROVIDER_SITE_OTHER): Payer: BC Managed Care – PPO | Admitting: Nurse Practitioner

## 2023-07-26 ENCOUNTER — Ambulatory Visit (INDEPENDENT_AMBULATORY_CARE_PROVIDER_SITE_OTHER)
Admission: RE | Admit: 2023-07-26 | Discharge: 2023-07-26 | Disposition: A | Discharge status: Home / Self Care | Payer: BC Managed Care – PPO | Source: Ambulatory Visit | Attending: Nurse Practitioner

## 2023-07-26 ENCOUNTER — Encounter: Payer: Self-pay | Admitting: Nurse Practitioner

## 2023-07-26 ENCOUNTER — Ambulatory Visit: Payer: BC Managed Care – PPO | Admitting: Internal Medicine

## 2023-07-26 VITALS — BP 125/75 | HR 64 | Temp 98.2°F | Resp 18 | Wt 145.0 lb

## 2023-07-26 DIAGNOSIS — M1611 Unilateral primary osteoarthritis, right hip: Secondary | ICD-10-CM | POA: Diagnosis not present

## 2023-07-26 DIAGNOSIS — M51362 Other intervertebral disc degeneration, lumbar region with discogenic back pain and lower extremity pain: Secondary | ICD-10-CM | POA: Diagnosis not present

## 2023-07-26 DIAGNOSIS — M47816 Spondylosis without myelopathy or radiculopathy, lumbar region: Secondary | ICD-10-CM | POA: Diagnosis not present

## 2023-07-26 DIAGNOSIS — M545 Low back pain, unspecified: Secondary | ICD-10-CM | POA: Diagnosis not present

## 2023-07-26 DIAGNOSIS — M25551 Pain in right hip: Secondary | ICD-10-CM | POA: Diagnosis not present

## 2023-07-26 MED ORDER — PREDNISONE 20 MG PO TABS: ORAL_TABLET | ORAL | 0 refills | Status: AC

## 2023-07-26 MED ORDER — TRAMADOL HCL 50 MG PO TABS: 50.0000 mg | ORAL_TABLET | Freq: Three times a day (TID) | ORAL | 0 refills | Status: AC | PRN

## 2023-07-26 MED ORDER — CYCLOBENZAPRINE HCL 5 MG PO TABS: 5.0000 mg | ORAL_TABLET | Freq: Every day | ORAL | 0 refills | Status: DC

## 2023-07-26 NOTE — Patient Instructions (Addendum)
Go to [address] for lumbar spine x-ray FMLA form will be completed for 07/11/2023 to 08/01/2023

## 2023-07-26 NOTE — Assessment & Plan Note (Signed)
 Persistent right anterior hip pain, unsteady gait and leg weakness x 2weeks, onset while at work (consist of moving boxes). She denies any fall or specific injury. She was seen for right side upper back pain 4weeks, this resolved with use of ibuprofen  and robaxin . This pain has not improved with use of ketorolac  10mg , and prednisone  20mg . Hip x-ray obtained 07/18/23: There is no evidence of hip fracture or dislocation. Minimal osteophyte formation of right hip is noted without joint space narrowing. Lumbar spine x-ray obtained today: Multilevel degenerative changes as noted above. No acute abnormality seen.  Significant right hip pain and right leg weakness noted today. She has difficulty getting on and off exam table without help. Unable to ambulate without cane. Sent prednisone  20mg  dose pack, tramadol  for sever pain, use tylenol  for mild to moderate pain Entered referral to PT Completed FMLA form F/up in 2weeks

## 2023-07-26 NOTE — Progress Notes (Signed)
 Established Patient Visit  Patient: Paige Foster   DOB: Nov 01, 1965   57 y.o. Female  MRN: [id number] Visit Date: 07/26/2023  Subjective:    Chief Complaint  Patient presents with   OFFICE VISIT     Follow up from 07/18/2023 visit Dr. Thedora MD. PT C/O of right hip and leg pain for 1-2 weeks. She is due for shingles and TDAP vaccinations.    HPI Pain of right hip Persistent right anterior hip pain, unsteady gait and leg weakness x 2weeks, onset while at work (consist of moving boxes). She denies any fall or specific injury. She was seen for right side upper back pain 4weeks, this resolved with use of ibuprofen  and robaxin . This pain has not improved with use of ketorolac  10mg , and prednisone  20mg . Hip x-ray obtained 07/18/23: There is no evidence of hip fracture or dislocation. Minimal osteophyte formation of right hip is noted without joint space narrowing. Lumbar spine x-ray obtained today: Multilevel degenerative changes as noted above. No acute abnormality seen.  Significant right hip pain and right leg weakness noted today. She has difficulty getting on and off exam table without help. Unable to ambulate without cane. Sent prednisone  20mg  dose pack, tramadol  for sever pain, use tylenol  for mild to moderate pain Entered referral to PT Completed FMLA form F/up in 2weeks   Reviewed medical, surgical, and social history today  Medications: Outpatient Medications Prior to Visit  Medication Sig   alendronate  (FOSAMAX ) 70 MG tablet Take 1 tablet (70 mg total) by mouth every 7 (seven) days. Take with a full glass of water  on an empty stomach.   amLODipine  (NORVASC ) 10 MG tablet Take 1 tablet by mouth daily.   anastrozole  (ARIMIDEX ) 1 MG tablet Take 1 tablet (1 mg total) by mouth daily.   atorvastatin  (LIPITOR) 20 MG tablet Take 1 tablet (20 mg total) by mouth daily.   brimonidine  (ALPHAGAN ) 0.15 % ophthalmic solution 1 drop 2 (two) times daily.   brimonidine   (ALPHAGAN ) 0.2 % ophthalmic solution SMARTSIG:In Eye(s)   dorzolamide -timolol  (COSOPT ) 2-0.5 % ophthalmic solution 1 drop 2 (two) times daily.   losartan  (COZAAR ) 50 MG tablet TAKE 1 TABLET(50 MG) BY MOUTH DAILY   prednisoLONE  acetate (PRED FORTE ) 1 % ophthalmic suspension Place 1 drop into the left eye 4 (four) times daily.   [DISCONTINUED] ibuprofen  (ADVIL ) 800 MG tablet Take 1 tablet (800 mg total) by mouth every 8 (eight) hours as needed (pain). Take with food.   [DISCONTINUED] ketorolac  (TORADOL ) 10 MG tablet Take 10 mg by mouth 4 (four) times daily.   [DISCONTINUED] methocarbamol  (ROBAXIN ) 500 MG tablet Take 1 tablet (500 mg total) by mouth every 8 (eight) hours as needed for muscle spasms.   [DISCONTINUED] predniSONE  (DELTASONE ) 20 MG tablet Take 1 tablet (20 mg total) by mouth daily with breakfast.   No facility-administered medications prior to visit.   Reviewed past medical and social history.   ROS per HPI above      Objective:  BP 125/75 (BP Location: Left Arm, Patient Position: Sitting, Cuff Size: Large)   Pulse 64   Temp 98.2 F (36.8 C) (Temporal)   Resp 18   Wt 145 lb (65.8 kg)   LMP 06/08/2007 Comment: Just stopped having period  SpO2 100%   BMI 26.52 kg/m      Physical Exam Cardiovascular:     Rate and Rhythm: Normal rate.     Pulses:  Normal pulses.  Pulmonary:     Effort: Pulmonary effort is normal.  Musculoskeletal:     Thoracic back: Normal.     Lumbar back: No spasms, tenderness or bony tenderness. Normal range of motion. Positive right straight leg raise test. Negative left straight leg raise test.     Right hip: Tenderness and bony tenderness present. No crepitus. Decreased range of motion. Decreased strength.     Left hip: Normal.     Right upper leg: Normal.     Right knee: Normal.     Right lower leg: Normal.     Right ankle: Normal.     Right foot: Normal.  Neurological:     Mental Status: She is oriented to person, place, and time.      Deep Tendon Reflexes:     Reflex Scores:      Patellar reflexes are 2+ on the right side and 2+ on the left side.      Achilles reflexes are 2+ on the right side and 2+ on the left side.    No results found for any visits on 07/26/23.    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 She denies any fall or specific injury. She was seen for right side upper back pain 4weeks, this resolved with use of ibuprofen  and robaxin . This pain has not improved with use of ketorolac  10mg , and prednisone  20mg . Hip x-ray obtained 07/18/23: There is no evidence of hip fracture or dislocation. Minimal osteophyte formation of right hip is noted without joint space narrowing. Lumbar spine x-ray obtained today: Multilevel degenerative changes as noted above. No acute abnormality seen.  Significant right hip pain and right leg weakness noted today. She has difficulty getting on and off exam table without help. Unable to ambulate without cane. Sent prednisone  20mg  dose pack, tramadol  for sever pain, use tylenol  for mild to moderate pain Entered referral to PT Completed FMLA form F/up in 2weeks      Relevant Medications   predniSONE  (DELTASONE ) 20 MG tablet   traMADol  (ULTRAM ) 50 MG tablet   cyclobenzaprine  (FLEXERIL ) 5 MG tablet   Other Relevant Orders   DG Lumbar Spine Complete (Completed)   Ambulatory referral to Physical Therapy   Primary osteoarthritis of right hip   Relevant Medications   predniSONE  (DELTASONE ) 20 MG tablet   traMADol  (ULTRAM ) 50 MG tablet   cyclobenzaprine  (FLEXERIL )  5 MG tablet   Return in about 2 weeks (around 08/09/2023) for hip pain.     Roselie Mood, NP

## 2023-08-02 ENCOUNTER — Other Ambulatory Visit: Payer: Self-pay

## 2023-08-02 ENCOUNTER — Ambulatory Visit: Payer: BC Managed Care – PPO | Attending: Nurse Practitioner | Admitting: Physical Therapy

## 2023-08-02 DIAGNOSIS — M25551 Pain in right hip: Secondary | ICD-10-CM | POA: Diagnosis not present

## 2023-08-02 DIAGNOSIS — M6281 Muscle weakness (generalized): Secondary | ICD-10-CM | POA: Diagnosis not present

## 2023-08-02 DIAGNOSIS — R2689 Other abnormalities of gait and mobility: Secondary | ICD-10-CM | POA: Insufficient documentation

## 2023-08-02 NOTE — Therapy (Signed)
 OUTPATIENT PHYSICAL THERAPY LOWER EXTREMITY EVALUATION   Patient Name: Paige Foster MRN: [id number] DOB:1966/03/18, 58 y.o., female Today's Date: 08/02/2023  END OF SESSION:  PT End of Session - 08/02/23 0926     Visit Number 1    Number of Visits 16    Date for PT Re-Evaluation 09/27/23    Authorization Type BCBS    PT Start Time 0930    PT Stop Time 1015    PT Time Calculation (min) 45 min    Activity Tolerance Patient tolerated treatment well    Behavior During Therapy [facility] for tasks assessed/performed             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    Recurrent breast cancer, left [facility]) oncologist-- dr layla    dx 2004, noninvasive Stage 0 ----s/p left mastectomy w/ tram flap construction (and right breast reduction), taken Tamoxifen for 3 yrs;   08-30-2018 recurrent left cancer , Grade III,  cT1c,  ER positive, PR negative, HER-2 positive, invasive ductal carcinoma-- neoadjuvant chemo to start 09-26-2018   Renal artery stenosis (HCC)    mild right external renal artery stenosis per duplex in epic 08-09-2013   Wears glasses    Past Surgical History:  Procedure Laterality Date   BREAST LUMPECTOMY WITH RADIOACTIVE SEED LOCALIZATION Left 02/20/2019   Procedure: LEFT BREAST LUMPECTOMY WITH RADIOACTIVE SEED LOCALIZATION;  Surgeon: Ebbie Cough, MD;  Location: [facility] OR;  Service: General;  Laterality: Left;   BREAST SURGERY Left    Transflap   COLONOSCOPY     COLONOSCOPY     MASTECTOMY Left 2004   w/  TRAM flap construction and right breast augmentation with abdominoplasy   PARS PLANA VITRECTOMY Left 12/18/2018   Procedure: PARS PLANA VITRECTOMY WITH 25 GAUGE, ENDOLASER;  Surgeon: Tobie Baptist, MD;  Location: [facility] OR;  Service: Ophthalmology;  Laterality: Left;   PORTACATH PLACEMENT N/A 09/25/2018   Procedure:  INSERTION PORT-A-CATH WITH ULTRASOUND;  Surgeon: Ebbie Cough, MD;  Location: [facility] ORS;  Service: General;  Laterality: N/A;   TUBAL LIGATION Bilateral yrs ago   Patient Active Problem List   Diagnosis Date Noted   Degeneration of intervertebral disc of lumbar region with discogenic back pain and lower extremity pain 07/26/2023   Primary osteoarthritis of right hip 07/26/2023   Pain of right hip 07/26/2023   Bulging eyes 07/04/2023   Use of anastrozole  (Arimidex ) 07/04/2023   Pure hypercholesterolemia 11/17/2021   Anemia 11/17/2021   Osteopenia 08/16/2019   Genetic testing 02/05/2019   Port-A-Cath in place 09/28/2018   Recurrent breast cancer, left (HCC) 09/07/2018   Malignant neoplasm of upper-outer quadrant of left breast in female, estrogen receptor positive (HCC) 09/06/2018   HTN (hypertension) 07/10/2013   HEMORRHOIDS-INTERNAL 04/23/2009   History of colonic polyps 04/23/2009   HEMORRHOID, THROMBOSED 03/12/2009    PCP: Katheen Roselie Rockford, NP  REFERRING PROVIDER: Katheen Roselie Rockford, NP  REFERRING DIAG: M25.551 (ICD-10-CM) - Pain of right hip  THERAPY DIAG:  Pain in right hip  Other abnormalities of gait and mobility  Muscle weakness (generalized)  Rationale for Evaluation and Treatment: Rehabilitation  ONSET DATE: ~2-3 weeks  SUBJECTIVE:   SUBJECTIVE STATEMENT: Pt reports trouble putting weight on her R side. Makes it difficult to walk but getting a little better. Feels like  a resistance. Not sure what happened. She was at work and then couldn't put weight on her right side due to pain. Has tried medication from the doctor and feels it probably has helped. Did note some trouble walking but didn't think too much of it. Has not always used quad cane -- just got it.   PERTINENT HISTORY: No prior injuries  PAIN:  Are you having pain? Yes: NPRS scale: 0/10 at rest, at worst 7/10  Pain location: R groin area and back of hip Pain description: Pull Aggravating  factors: putting weight on it Relieving factors: heat  PRECAUTIONS: None  RED FLAGS: None   WEIGHT BEARING RESTRICTIONS: No  FALLS:  Has patient fallen in last 6 months? No  LIVING ENVIRONMENT: Lives with: lives with their spouse Lives in: House/apartment Stairs: Yes: Internal: 10 steps; on right going up Has following equipment at home: Quad cane small base  OCCUPATION: Works at [employer] so a lot of standing and lifting  PLOF: Independent  PATIENT GOALS: Be able to put weight on her R leg without pain  NEXT MD VISIT: n/a  OBJECTIVE:  Note: Objective measures were completed at Evaluation unless otherwise noted.  DIAGNOSTIC FINDINGS: 07/26/23 Lumbar IMPRESSION: Multilevel degenerative changes as noted above. No acute abnormality seen.  07/26/23 Hip IMPRESSION: Minimal degenerative joint disease of right hip. No acute abnormality seen.    PATIENT SURVEYS:  FOTO 29; predicted 39  COGNITION: Overall cognitive status: Within functional limits for tasks assessed     SENSATION: WFL  EDEMA:  None  MUSCLE LENGTH: Hamstrings: Right >95 deg; Left 90 deg Thomas test: Right tighter than Left   POSTURE:  Maintains R hip and knee flexed in standing  PALPATION: Most TTP along R piriformis and glute  LOWER EXTREMITY ROM: PROM all WNL   LOWER EXTREMITY MMT:  MMT Right eval Left eval  Hip flexion 3+* 4  Hip extension 2 3  Hip abduction 3 4  Hip adduction    Hip internal rotation    Hip external rotation    Knee flexion 5 5  Knee extension 3+ 5  Ankle dorsiflexion    Ankle plantarflexion    Ankle inversion    Ankle eversion     (Blank rows = not tested)  LOWER EXTREMITY SPECIAL TESTS:  Hip special tests: Belvie (FABER) test: positive , Trendelenburg test: positive , Thomas test: positive , and Anterior hip impingement test: positive   FUNCTIONAL TESTS:  5 times sit to stand: 16.03 sec  GAIT: Distance walked: Into clinic Assistive device  utilized: Quad cane small base Level of assistance: SBA and provided cueing for proper use of her quad cane Comments: Antalgic, reduced R LE stance time, R knee and hip flexed during stance, R hip ER with reduced heel strike/toe off  TREATMENT DATE: 08/02/23 See HEP below    PATIENT EDUCATION:  Education details: Exam findings, POC, initial HEP Person educated: Patient Education method: Explanation, Demonstration, and Handouts Education comprehension: verbalized understanding, returned demonstration, and needs further education  HOME EXERCISE PROGRAM: Access Code: [email or web address] URL: [email or web address] Date: 08/02/2023 Prepared by: Jovanni Eckhart April Earnie Starring  Exercises - Supine Bridge  - 1 x daily - 7 x weekly - 3 sets - 10 reps - Hooklying Isometric Clamshell  - 1 x daily - 7 x weekly - 3 sets - 10 reps - Supine Quad Set  - 1 x daily - 7 x weekly - 3 sets - 10 reps - Supine Active Straight Leg Raise  - 1 x daily - 7 x weekly - 3 sets - 10 reps - Supine Piriformis Stretch with Foot on Ground  - 1 x daily - 7 x weekly - 2 sets - 30 sec hold  ASSESSMENT:  CLINICAL IMPRESSION: Patient is a 58 y.o. F who was seen today for physical therapy evaluation and treatment for R hip pain. Pain is primarily present along her R anterior hip/groin. Assessment significant for very mobile R hip but very poor strength and stability likely resulting in her increased pain in standing. Some muscle tightness/trigger points in piriformis/glutes adding to her discomfort. Pt will benefit from PT to address these deficits for return to all normal function and ambulating without a/d.   OBJECTIVE IMPAIRMENTS: Abnormal gait, decreased activity tolerance, decreased balance, decreased endurance, decreased mobility, difficulty walking, decreased strength, increased fascial  restrictions, increased muscle spasms, improper body mechanics, postural dysfunction, and pain.   ACTIVITY LIMITATIONS: lifting, standing, stairs, transfers, and locomotion level  PARTICIPATION LIMITATIONS: cleaning, laundry, shopping, community activity, and occupation  PERSONAL FACTORS: Fitness, Past/current experiences, and Time since onset of injury/illness/exacerbation are also affecting patient's functional outcome.   REHAB POTENTIAL: Good  CLINICAL DECISION MAKING: Evolving/moderate complexity  EVALUATION COMPLEXITY: Moderate   GOALS: Goals reviewed with patient? Yes  SHORT TERM GOALS: Target date: 08/30/2023  Pt will be ind with initial HEP Baseline: Goal status: INITIAL  2.  Pt will report >/=50% improvement in pain Baseline:  Goal status: INITIAL  3.  Pt will be able to perform 5x STS in </=13 sec Baseline:  Goal status: INITIAL   LONG TERM GOALS: Target date: 09/27/2023   Pt will be ind with management and progression of HEP Baseline:  Goal status: INITIAL  2.  Pt will be able to amb 1000' ind with normal reciprocal gait for community mobility Baseline: Currently using quad cane Goal status: INITIAL  3.  Pt will be able to lift and carry at least 25# for work related tasks with </=2/10 hip pain Baseline:  Goal status: INITIAL  4.  Pt will be able to perform R SLS for >=20 sec to demo increased stability Baseline:  Goal status: INITIAL    PLAN:  PT FREQUENCY: 2x/week  PT DURATION: 8 weeks  PLANNED INTERVENTIONS: 97164- PT Re-evaluation, 97110-Therapeutic exercises, 97530- Therapeutic activity, [id number]- Neuromuscular re-education, 97535- Self Care, [id number]- Manual therapy, [id number]- Gait training, [phone]- Aquatic Therapy, 97014- Electrical stimulation (unattended), [phone]- Ionotophoresis 4mg /ml Dexamethasone , Patient/Family education, Balance training, Stair training, Taping, Dry Needling, Joint mobilization, Cryotherapy, and Moist heat  PLAN FOR NEXT  SESSION: Assess response to HEP. Work on R hip strength/stability -- start in NWB and progress to weight bearing as pt tolerates. Gait train with quad cane.    Siani Utke April Ma L Tidus Upchurch, PT 08/02/2023, 1:00 PM

## 2023-08-03 ENCOUNTER — Ambulatory Visit: Payer: BC Managed Care – PPO | Admitting: Radiology

## 2023-08-03 ENCOUNTER — Ambulatory Visit
Admission: EM | Admit: 2023-08-03 | Discharge: 2023-08-03 | Disposition: A | Discharge status: Home / Self Care | Payer: BC Managed Care – PPO | Attending: Family Medicine | Admitting: Family Medicine

## 2023-08-03 ENCOUNTER — Ambulatory Visit: Payer: Self-pay | Admitting: Nurse Practitioner

## 2023-08-03 ENCOUNTER — Encounter: Payer: Self-pay | Admitting: Emergency Medicine

## 2023-08-03 DIAGNOSIS — M47814 Spondylosis without myelopathy or radiculopathy, thoracic region: Secondary | ICD-10-CM | POA: Diagnosis not present

## 2023-08-03 DIAGNOSIS — M19011 Primary osteoarthritis, right shoulder: Secondary | ICD-10-CM | POA: Diagnosis not present

## 2023-08-03 DIAGNOSIS — M778 Other enthesopathies, not elsewhere classified: Secondary | ICD-10-CM | POA: Diagnosis not present

## 2023-08-03 DIAGNOSIS — M25511 Pain in right shoulder: Secondary | ICD-10-CM

## 2023-08-03 MED ORDER — MELOXICAM 7.5 MG PO TABS: 7.5000 mg | ORAL_TABLET | Freq: Every day | ORAL | 0 refills | Status: DC

## 2023-08-03 MED ORDER — KETOROLAC TROMETHAMINE 30 MG/ML IJ SOLN
30.0000 mg | Freq: Once | INTRAMUSCULAR | Status: AC
  Administered 2023-08-03: 30 mg via INTRAMUSCULAR

## 2023-08-03 NOTE — Discharge Instructions (Signed)
The x-ray reading we discussed is preliminary. Your x-ray will be read by a radiologist in next few hours. If there is a discrepancy, you will be contacted, and instructed on a new plan for you care.

## 2023-08-03 NOTE — ED Triage Notes (Addendum)
 Pt c/o right shoulder pain that radiates down to hand for 3-4 days. Denies any injury.

## 2023-08-03 NOTE — Telephone Encounter (Signed)
  Chief Complaint: Severe Right Shoulder pain Symptoms: Severe right shoulder pain radiating into arm Frequency: 3 days Pertinent Negatives: Patient denies nausea, cold/flu symptoms, fever, rash, chest pain, jaw pain, redness/swelling, rash, hx of cardiac disease Disposition: [] ED /[x] Urgent Care (no appt availability in office) / [] Appointment(In office/virtual)/ []  [place]/ [] Home Care/ [] Refused Recommended Disposition /[] [facility]/ []  Follow-up with PCP Additional Notes: Patient called in stating she is experiencing severe pain in her right shoulder, and down her right arm, rating a 10/10 pain. Patient states unknown cause, she woke up with this pain, and she feels there is weakness in the arm too. Patient denies radiating pain to jaw, chest pain, nausea/vomiting, cold symptoms, fever, rash, redness/swelling, hx of cardiac disease. Patient states the pain is severe but it is worse with movement. Patient states she has tried medication prescribed by NP and also ibuprofen  but no relief. Advised patient to be seen in ED or UC to be evaluated immediately. Patient verbalized understanding and stated she does have a family member who can drive her to the UC.   Copied from CRM [id number]. Topic: Clinical - Red Word Triage >> Aug 03, 2023 10:21 AM Isabell A wrote: [facility] that prompted transfer to Nurse Triage: Severe shoulder pain, level 10 pain (10 being the highest) Reason for Disposition  [1] SEVERE pain AND [2] not improved 2 hours after pain medicine  Answer Assessment - Initial Assessment Questions 1. ONSET: When did the pain start?     About 3 days ago 2. LOCATION: Where is the pain located?     Right shoulder 3. PAIN: How bad is the pain? (Scale 1-10; or mild, moderate, severe)   - MILD (1-3): doesn't interfere with normal activities   - MODERATE (4-7): interferes with normal activities (e.g., work or school) or awakens from sleep   - SEVERE (8-10):  excruciating pain, unable to do any normal activities, unable to move arm at all due to pain     10/10 pain 4. WORK OR EXERCISE: Has there been any recent work or exercise that involved this part of the body?     No, I woke up and it was like that in a lot of pain 5. CAUSE: What do you think is causing the shoulder pain?     No 6. OTHER SYMPTOMS: Do you have any other symptoms? (e.g., neck pain, swelling, rash, fever, numbness, weakness)     Weakness in arm, painful in arm  Protocols used: Shoulder Pain-A-AH

## 2023-08-03 NOTE — ED Provider Notes (Signed)
 [facility]    CSN: [id number] Arrival date & time: 08/03/23  1339      History   Chief Complaint Chief Complaint  Patient presents with   Shoulder Pain    HPI Paige Foster is a 58 y.o. female.    Shoulder Pain Associated symptoms: no back pain, no fever and no neck pain   Right shoulder pain gradual onset 3 to 4 days ago without injury.  Pain is constant, throbbing, worse with certain movements including abduction.  Pain radiates down her right arm.  No relieving factors.  Denies neck pain, injury, history of similar symptoms, paresthesias, weakness.  Admits recent right hip problem which caused difficulty ambulating, she has been using a crutch on her left arm, she was treated with prednisone . Requesting a shot of something for pain.  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) oncologist-- dr layla    dx 2004, noninvasive Stage 0 ----s/p left mastectomy w/ tram flap construction (and right breast reduction), taken Tamoxifen for 3 yrs;   08-30-2018 recurrent left cancer , Grade III,  cT1c,  ER positive, PR negative, HER-2 positive, invasive ductal carcinoma-- neoadjuvant chemo to start 09-26-2018   Renal artery stenosis (HCC)    mild right external renal artery stenosis per duplex in epic 08-09-2013   Wears glasses     Patient Active Problem List   Diagnosis Date Noted   Degeneration of intervertebral disc of lumbar region with discogenic back pain and lower extremity pain 07/26/2023   Primary osteoarthritis of right hip 07/26/2023   Pain of right hip 07/26/2023   Bulging eyes 07/04/2023   Use of anastrozole  (Arimidex ) 07/04/2023   Pure hypercholesterolemia 11/17/2021   Anemia 11/17/2021   Osteopenia 08/16/2019   Genetic testing 02/05/2019   Port-A-Cath in place 09/28/2018    Recurrent breast cancer, left (HCC) 09/07/2018   Malignant neoplasm of upper-outer quadrant of left breast in female, estrogen receptor positive (HCC) 09/06/2018   HTN (hypertension) 07/10/2013   HEMORRHOIDS-INTERNAL 04/23/2009   History of colonic polyps 04/23/2009   HEMORRHOID, THROMBOSED 03/12/2009    Past Surgical History:  Procedure Laterality Date   BREAST LUMPECTOMY WITH RADIOACTIVE SEED LOCALIZATION Left 02/20/2019   Procedure: LEFT BREAST LUMPECTOMY WITH RADIOACTIVE SEED LOCALIZATION;  Surgeon: Ebbie Cough, MD;  Location: [facility] OR;  Service: General;  Laterality: Left;   BREAST SURGERY Left    Transflap   COLONOSCOPY     COLONOSCOPY     MASTECTOMY Left 2004   w/  TRAM flap construction and right breast augmentation with abdominoplasy   PARS PLANA VITRECTOMY Left 12/18/2018   Procedure: PARS PLANA VITRECTOMY WITH 25 GAUGE, ENDOLASER;  Surgeon: Tobie Baptist, MD;  Location: [facility] OR;  Service: Ophthalmology;  Laterality: Left;   PORTACATH PLACEMENT N/A 09/25/2018   Procedure: INSERTION PORT-A-CATH WITH ULTRASOUND;  Surgeon: Ebbie Cough, MD;  Location: [facility] ORS;  Service: General;  Laterality: N/A;   TUBAL LIGATION Bilateral yrs ago    OB History   No obstetric history on file.      Home Medications    Prior to Admission medications   Medication Sig Start Date End Date Taking? Authorizing Provider  alendronate  (FOSAMAX ) 70 MG tablet Take 1 tablet (70 mg total) by mouth every 7 (seven)  days. Take with a full glass of water  on an empty stomach. 07/04/23   Nche, Roselie Rockford, NP  amLODipine  (NORVASC ) 10 MG tablet Take 1 tablet by mouth daily. 01/12/23   Nche, Roselie Rockford, NP  anastrozole  (ARIMIDEX ) 1 MG tablet Take 1 tablet (1 mg total) by mouth daily. 09/08/22   Iruku, Praveena, MD  atorvastatin  (LIPITOR) 20 MG tablet Take 1 tablet (20 mg total) by mouth daily. 10/28/22   Nche, Roselie Rockford, NP  brimonidine  (ALPHAGAN ) 0.15 % ophthalmic solution 1 drop 2 (two) times  daily. 06/06/23   [provider]  brimonidine  (ALPHAGAN ) 0.2 % ophthalmic solution SMARTSIG:In Eye(s) 05/18/23   [provider]  cyclobenzaprine  (FLEXERIL ) 5 MG tablet Take 1-2 tablets (5-10 mg total) by mouth at bedtime. 07/26/23   Nche, Roselie Rockford, NP  dorzolamide -timolol  (COSOPT ) 2-0.5 % ophthalmic solution 1 drop 2 (two) times daily. 06/06/23   [provider]  losartan  (COZAAR ) 50 MG tablet TAKE 1 TABLET(50 MG) BY MOUTH DAILY 10/27/22   Nche, Roselie Rockford, NP  prednisoLONE  acetate (PRED FORTE ) 1 % ophthalmic suspension Place 1 drop into the left eye 4 (four) times daily.    [provider]  predniSONE  (DELTASONE ) 20 MG tablet Take 2 tablets (40 mg total) by mouth daily with breakfast for 2 days, THEN 1.5 tablets (30 mg total) daily with breakfast for 2 days, THEN 1 tablet (20 mg total) daily with breakfast for 2 days, THEN 0.5 tablets (10 mg total) daily with breakfast for 2 days. 07/26/23 08/03/23  Nche, Roselie Rockford, NP  prochlorperazine  (COMPAZINE ) 10 MG tablet Take 1 tablet (10 mg total) by mouth every 6 (six) hours as needed (Nausea or vomiting). 09/18/18 01/11/19  Magrinat, Sandria BROCKS, MD    Family History Family History  Problem Relation Age of Onset   Diabetes Mother    Hypertension Mother    Kidney disease Father    Colon cancer Neg Hx    Colon polyps Neg Hx    Gallbladder disease Neg Hx    Heart disease Neg Hx    Esophageal cancer Neg Hx    Stomach cancer Neg Hx    Rectal cancer Neg Hx     Social History Social History   Tobacco Use   Smoking status: Never   Smokeless tobacco: Never  Vaping Use   Vaping status: Never Used  Substance Use Topics   Alcohol use: Not Currently    Alcohol/week: 0.0 standard drinks of alcohol    Comment: Occassionally   Drug use: No     Allergies   Patient has no known allergies.   Review of Systems Review of Systems  Constitutional:  Negative for fever.  Musculoskeletal:  Positive for  arthralgias. Negative for back pain, joint swelling, neck pain and neck stiffness.  Skin:  Negative for rash.  Neurological:  Negative for weakness and numbness.     Physical Exam Triage Vital Signs ED Triage Vitals  Encounter Vitals Group     BP 08/03/23 1348 (!) 152/87     Systolic BP Percentile --      Diastolic BP Percentile --      Pulse Rate 08/03/23 1348 70     Resp 08/03/23 1348 16     Temp 08/03/23 1348 97.9 F (36.6 C)     Temp Source 08/03/23 1348 Oral     SpO2 08/03/23 1348 97 %     Weight --      Height --      Head  Circumference --      Peak Flow --      Pain Score 08/03/23 1346 10     Pain Loc --      Pain Education --      Exclude from Growth Chart --    No data found.  Updated Vital Signs BP (!) 152/87 (BP Location: Left Arm)   Pulse 70   Temp 97.9 F (36.6 C) (Oral)   Resp 16   LMP 06/08/2007 Comment: Just stopped having period  SpO2 97%   Visual Acuity Right Eye Distance:   Left Eye Distance:   Bilateral Distance:    Right Eye Near:   Left Eye Near:    Bilateral Near:     Physical Exam Vitals and nursing note reviewed.  Constitutional:      Appearance: She is not ill-appearing.  HENT:     Head: Normocephalic.  Eyes:     Conjunctiva/sclera: Conjunctivae normal.  Cardiovascular:     Rate and Rhythm: Normal rate and regular rhythm.     Heart sounds: Normal heart sounds.  Pulmonary:     Effort: Pulmonary effort is normal. No respiratory distress.     Breath sounds: Normal breath sounds. No wheezing, rhonchi or rales.  Musculoskeletal:     Right shoulder: Tenderness present. No bony tenderness. Normal range of motion. Normal strength.     Comments: Mild lateral tenderness, good range of motion but winces with abduction and external rotation Right elbow full range of motion nontender  Neurological:     Mental Status: She is alert and oriented to person, place, and time.      UC Treatments / Results  Labs (all labs ordered are  listed, but only abnormal results are displayed) Labs Reviewed - No data to display  EKG   Radiology No results found.  Procedures Procedures (including critical care time)  Medications Ordered in UC Medications  ketorolac  (TORADOL ) 30 MG/ML injection 30 mg (has no administration in time range)    Initial Impression / Assessment and Plan / UC Course  I have reviewed the triage vital signs and the nursing notes.  Pertinent labs & imaging results that were available during my care of the patient were reviewed by me and considered in my medical decision making (see chart for details).     58 year old with right shoulder pain no injury.  Has minimal tenderness on exam but good range of motion.  Right shoulder x-ray independently viewed by me degenerative changes no acute fracture, no dislocation. Will treat with NSAIDs recommend follow-up with PCP or orthopedist Final Clinical Impressions(s) / UC Diagnoses   Final diagnoses:  Acute pain of right shoulder   Discharge Instructions   None    ED Prescriptions   None    PDMP not reviewed this encounter.   Leylah Tarnow, [place] 08/03/23 1443

## 2023-08-04 ENCOUNTER — Ambulatory Visit: Payer: BC Managed Care – PPO | Admitting: Physical Therapy

## 2023-08-04 DIAGNOSIS — M6281 Muscle weakness (generalized): Secondary | ICD-10-CM

## 2023-08-04 DIAGNOSIS — M25551 Pain in right hip: Secondary | ICD-10-CM

## 2023-08-04 DIAGNOSIS — R2689 Other abnormalities of gait and mobility: Secondary | ICD-10-CM | POA: Diagnosis not present

## 2023-08-04 NOTE — Therapy (Signed)
 OUTPATIENT PHYSICAL THERAPY TREATMENT   Patient Name: TYERA HANSLEY MRN: [id number] DOB:04-21-1966, 58 y.o., female Today's Date: 08/04/2023  END OF SESSION:  PT End of Session - 08/04/23 1514     Visit Number 2    Number of Visits 16    Date for PT Re-Evaluation 09/27/23    Authorization Type BCBS    PT Start Time 1515    PT Stop Time 1600    PT Time Calculation (min) 45 min    Activity Tolerance Patient tolerated treatment well    Behavior During Therapy [facility] for tasks assessed/performed              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    Recurrent breast cancer, left [facility]) oncologist-- dr layla    dx 2004, noninvasive Stage 0 ----s/p left mastectomy w/ tram flap construction (and right breast reduction), taken Tamoxifen for 3 yrs;   08-30-2018 recurrent left cancer , Grade III,  cT1c,  ER positive, PR negative, HER-2 positive, invasive ductal carcinoma-- neoadjuvant chemo to start 09-26-2018   Renal artery stenosis (HCC)    mild right external renal artery stenosis per duplex in epic 08-09-2013   Wears glasses    Past Surgical History:  Procedure Laterality Date   BREAST LUMPECTOMY WITH RADIOACTIVE SEED LOCALIZATION Left 02/20/2019   Procedure: LEFT BREAST LUMPECTOMY WITH RADIOACTIVE SEED LOCALIZATION;  Surgeon: Ebbie Cough, MD;  Location: [facility] OR;  Service: General;  Laterality: Left;   BREAST SURGERY Left    Transflap   COLONOSCOPY     COLONOSCOPY     MASTECTOMY Left 2004   w/  TRAM flap construction and right breast augmentation with abdominoplasy   PARS PLANA VITRECTOMY Left 12/18/2018   Procedure: PARS PLANA VITRECTOMY WITH 25 GAUGE, ENDOLASER;  Surgeon: Tobie Baptist, MD;  Location: [facility] OR;  Service: Ophthalmology;  Laterality: Left;   PORTACATH PLACEMENT N/A 09/25/2018   Procedure: INSERTION PORT-A-CATH  WITH ULTRASOUND;  Surgeon: Ebbie Cough, MD;  Location: [facility] ORS;  Service: General;  Laterality: N/A;   TUBAL LIGATION Bilateral yrs ago   Patient Active Problem List   Diagnosis Date Noted   Degeneration of intervertebral disc of lumbar region with discogenic back pain and lower extremity pain 07/26/2023   Primary osteoarthritis of right hip 07/26/2023   Pain of right hip 07/26/2023   Bulging eyes 07/04/2023   Use of anastrozole  (Arimidex ) 07/04/2023   Pure hypercholesterolemia 11/17/2021   Anemia 11/17/2021   Osteopenia 08/16/2019   Genetic testing 02/05/2019   Port-A-Cath in place 09/28/2018   Recurrent breast cancer, left (HCC) 09/07/2018   Malignant neoplasm of upper-outer quadrant of left breast in female, estrogen receptor positive (HCC) 09/06/2018   HTN (hypertension) 07/10/2013   HEMORRHOIDS-INTERNAL 04/23/2009   History of colonic polyps 04/23/2009   HEMORRHOID, THROMBOSED 03/12/2009    PCP: Katheen Roselie Rockford, NP  REFERRING PROVIDER: Katheen Roselie Rockford, NP  REFERRING DIAG: M25.551 (ICD-10-CM) - Pain of right hip  THERAPY DIAG:  Pain in right hip  Other abnormalities of gait and mobility  Muscle weakness (generalized)  Rationale for Evaluation and Treatment: Rehabilitation  ONSET DATE: ~2-3 weeks  SUBJECTIVE:   SUBJECTIVE STATEMENT: Pt states she woke up yesterday and her right shoulder was hurting. Went to urgent care and it's feeling better today. Pt  states hip is more of a resistance.   PERTINENT HISTORY: No prior injuries  PAIN:  Are you having pain? Yes: NPRS scale: 6/10  Pain location: R groin area and back of hip Pain description: Pull Aggravating factors: putting weight on it Relieving factors: heat  PRECAUTIONS: None  RED FLAGS: None   WEIGHT BEARING RESTRICTIONS: No  FALLS:  Has patient fallen in last 6 months? No  LIVING ENVIRONMENT: Lives with: lives with their spouse Lives in: House/apartment Stairs: Yes: Internal: 10  steps; on right going up Has following equipment at home: Quad cane small base  OCCUPATION: Works at [employer] so a lot of standing and lifting  PLOF: Independent  PATIENT GOALS: Be able to put weight on her R leg without pain  NEXT MD VISIT: n/a  OBJECTIVE:  Note: Objective measures were completed at Evaluation unless otherwise noted.  DIAGNOSTIC FINDINGS: 07/26/23 Lumbar IMPRESSION: Multilevel degenerative changes as noted above. No acute abnormality seen.  07/26/23 Hip IMPRESSION: Minimal degenerative joint disease of right hip. No acute abnormality seen.    PATIENT SURVEYS:  FOTO 29; predicted 1  COGNITION: Overall cognitive status: Within functional limits for tasks assessed     SENSATION: WFL  EDEMA:  None  MUSCLE LENGTH: Hamstrings: Right >95 deg; Left 90 deg Thomas test: Right tighter than Left   POSTURE:  Maintains R hip and knee flexed in standing  PALPATION: Most TTP along R piriformis and glute  LOWER EXTREMITY ROM: PROM all WNL   LOWER EXTREMITY MMT:  MMT Right eval Left eval  Hip flexion 3+* 4  Hip extension 2 3  Hip abduction 3 4  Hip adduction    Hip internal rotation    Hip external rotation    Knee flexion 5 5  Knee extension 3+ 5  Ankle dorsiflexion    Ankle plantarflexion    Ankle inversion    Ankle eversion     (Blank rows = not tested, * = pain)  LOWER EXTREMITY SPECIAL TESTS:  Hip special tests: Belvie (FABER) test: positive , Trendelenburg test: positive , Thomas test: positive , and Anterior hip impingement test: positive   FUNCTIONAL TESTS:  5 times sit to stand: 16.03 sec  GAIT: Distance walked: Into clinic Assistive device utilized: Quad cane small base Level of assistance: SBA and provided cueing for proper use of her quad cane Comments: Antalgic, reduced R LE stance time, R knee and hip flexed during stance, R hip ER with reduced heel strike/toe off                                                                                                                                 TREATMENT DATE:  08/04/23 Nustep L4 x 5 min, LEs only due to R UE hurting Manual therapy:  Grade II to III hip mobilization for ext, flexion and lateral distraction to decrease anterior hip impingement  Manual stretch for glutes/piriformis Supine  Piriformis  x30 PPT + marching x10 Glute set with leg extended x10 SLR + PPT 2x10 Bridge 2x10  Clamshell green TB 2x10 R&L Kneel to tall kneel 2x10 Staggered sit to stand 2x10 Staggered stance rock fwd/bwd x10 Forward lunge on 8 step 2x10  08/02/23 See HEP below    PATIENT EDUCATION:  Education details: Exam findings, POC, initial HEP Person educated: Patient Education method: Explanation, Demonstration, and Handouts Education comprehension: verbalized understanding, returned demonstration, and needs further education  HOME EXERCISE PROGRAM: Access Code: [email or web address] URL: [email or web address] Date: 08/02/2023 Prepared by: Sulayman Manning April Earnie Starring  Exercises - Supine Bridge  - 1 x daily - 7 x weekly - 3 sets - 10 reps - Hooklying Isometric Clamshell  - 1 x daily - 7 x weekly - 3 sets - 10 reps - Supine Quad Set  - 1 x daily - 7 x weekly - 3 sets - 10 reps - Supine Active Straight Leg Raise  - 1 x daily - 7 x weekly - 3 sets - 10 reps - Supine Piriformis Stretch with Foot on Ground  - 1 x daily - 7 x weekly - 2 sets - 30 sec hold  ASSESSMENT:  CLINICAL IMPRESSION: Patient is a 58 y.o. F who was seen today for physical therapy treatment for R hip pain. Pain is primarily present along her R anterior hip/groin. Treatment session focused on decreasing anterior hip impingement and increasing posterior hip and core strength  OBJECTIVE IMPAIRMENTS: Abnormal gait, decreased activity tolerance, decreased balance, decreased endurance, decreased mobility, difficulty walking, decreased strength, increased fascial restrictions, increased muscle spasms, improper  body mechanics, postural dysfunction, and pain.   ACTIVITY LIMITATIONS: lifting, standing, stairs, transfers, and locomotion level  PARTICIPATION LIMITATIONS: cleaning, laundry, shopping, community activity, and occupation  PERSONAL FACTORS: Fitness, Past/current experiences, and Time since onset of injury/illness/exacerbation are also affecting patient's functional outcome.   REHAB POTENTIAL: Good  CLINICAL DECISION MAKING: Evolving/moderate complexity  EVALUATION COMPLEXITY: Moderate   GOALS: Goals reviewed with patient? Yes  SHORT TERM GOALS: Target date: 08/30/2023  Pt will be ind with initial HEP Baseline: Goal status: INITIAL  2.  Pt will report >/=50% improvement in pain Baseline:  Goal status: INITIAL  3.  Pt will be able to perform 5x STS in </=13 sec Baseline:  Goal status: INITIAL   LONG TERM GOALS: Target date: 09/27/2023   Pt will be ind with management and progression of HEP Baseline:  Goal status: INITIAL  2.  Pt will be able to amb 1000' ind with normal reciprocal gait for community mobility Baseline: Currently using quad cane Goal status: INITIAL  3.  Pt will be able to lift and carry at least 25# for work related tasks with </=2/10 hip pain Baseline:  Goal status: INITIAL  4.  Pt will be able to perform R SLS for >=20 sec to demo increased stability Baseline:  Goal status: INITIAL    PLAN:  PT FREQUENCY: 2x/week  PT DURATION: 8 weeks  PLANNED INTERVENTIONS: 97164- PT Re-evaluation, 97110-Therapeutic exercises, 97530- Therapeutic activity, [id number]- Neuromuscular re-education, 97535- Self Care, [id number]- Manual therapy, [id number]- Gait training, [phone]- Aquatic Therapy, 97014- Electrical stimulation (unattended), [phone]- Ionotophoresis 4mg /ml Dexamethasone , Patient/Family education, Balance training, Stair training, Taping, Dry Needling, Joint mobilization, Cryotherapy, and Moist heat  PLAN FOR NEXT SESSION: Assess response to HEP. Work on R hip  strength/stability -- start in NWB and progress to weight bearing as pt tolerates. Gait train with quad cane.    Nakiesha Rumsey April Ma L Malena Timpone, PT 08/04/2023,  3:14 PM

## 2023-08-08 ENCOUNTER — Ambulatory Visit (INDEPENDENT_AMBULATORY_CARE_PROVIDER_SITE_OTHER): Payer: BC Managed Care – PPO | Admitting: Nurse Practitioner

## 2023-08-08 ENCOUNTER — Encounter: Payer: Self-pay | Admitting: Nurse Practitioner

## 2023-08-08 VITALS — BP 115/74 | HR 60 | Temp 97.9°F | Resp 18 | Wt 139.8 lb

## 2023-08-08 DIAGNOSIS — C50912 Malignant neoplasm of unspecified site of left female breast: Secondary | ICD-10-CM

## 2023-08-08 DIAGNOSIS — E7800 Pure hypercholesterolemia, unspecified: Secondary | ICD-10-CM

## 2023-08-08 DIAGNOSIS — M51362 Other intervertebral disc degeneration, lumbar region with discogenic back pain and lower extremity pain: Secondary | ICD-10-CM | POA: Diagnosis not present

## 2023-08-08 DIAGNOSIS — M25551 Pain in right hip: Secondary | ICD-10-CM

## 2023-08-08 DIAGNOSIS — M1611 Unilateral primary osteoarthritis, right hip: Secondary | ICD-10-CM

## 2023-08-08 LAB — LIPID PANEL
Cholesterol: 198 mg/dL (ref 0–200)
HDL: 58.1 mg/dL (ref >39.00)
LDL Cholesterol: 120 mg/dL — ABNORMAL HIGH (ref 0–99)
NonHDL: 139.51
Total CHOL/HDL Ratio: 3
Triglycerides: 97 mg/dL (ref 0.0–149.0)
VLDL: 19.4 mg/dL (ref 0.0–40.0)

## 2023-08-08 MED ORDER — MELOXICAM 7.5 MG PO TABS: 7.5000 mg | ORAL_TABLET | Freq: Every day | ORAL | 0 refills | Status: DC

## 2023-08-08 MED ORDER — CYCLOBENZAPRINE HCL 5 MG PO TABS: 5.0000 mg | ORAL_TABLET | Freq: Every day | ORAL | 0 refills | Status: DC

## 2023-08-08 NOTE — Assessment & Plan Note (Signed)
 Improving pain and ROM with outpatient PT, mobic , and flexeril  She has had 2 of 16 PT sessions.  Maintain above medications Advised to use tylenol  if needed for pain relief. Plan to return to work after completion of PT sessions Advised to take meds with food and avoid OVER THE COUNTER NSAIDs. F/up in 2months

## 2023-08-08 NOTE — Assessment & Plan Note (Signed)
 Atherosclerotic calcification of the aortic arch noted on shoulder x-ray 07/2023. No tobacco use, no DIABETES, HYPERTENSION controlled. Repeat lipid panel today Advised to maintain mediterranean diet and continue lipitor 20mg  at this time

## 2023-08-08 NOTE — Patient Instructions (Signed)
 Go to lab Maintain current meds Take meds with food  Mediterranean Diet A Mediterranean diet is based on the traditions of countries on the [employer]. It focuses on eating more: Fruits and vegetables. Whole grains, beans, nuts, and seeds. Heart-healthy fats. These are fats that are good for your heart. It involves eating less: Dairy. Meat and eggs. Processed foods with added sugar, salt, and fat. This type of diet can help prevent certain conditions. It can also improve outcomes if you have a long-term (chronic) disease, such as kidney or heart disease. What are tips for following this plan? Reading food labels Check packaged foods for: The serving size. For foods such as rice and pasta, the serving size is the amount of cooked product, not dry. The total fat. Avoid foods with saturated fat or trans fat. Added sugars, such as corn syrup. Shopping  Try to have a balanced diet. Buy a variety of foods, such as: Fresh fruits and vegetables. You may be able to get these from local farmers markets. You can also buy them frozen. Grains, beans, nuts, and seeds. Some of these can be bought in bulk. Fresh seafood. Poultry and eggs. Low-fat dairy products. Buy whole ingredients instead of foods that have already been packaged. If you can't get fresh seafood, buy precooked frozen shrimp or canned fish, such as tuna, salmon, or sardines. Stock your pantry so you always have certain foods on hand, such as olive oil, canned tuna, canned tomatoes, rice, pasta, and beans. Cooking Cook foods with extra-virgin olive oil instead of using butter or other vegetable oils. Have meat as a side dish. Have vegetables or grains as your main dish. This means having meat in small portions or adding small amounts of meat to foods like pasta or stew. Use beans or vegetables instead of meat in common dishes like chili or lasagna. Try out different cooking methods. Try roasting, broiling, steaming, and  sauting vegetables. Add frozen vegetables to soups, stews, pasta, or rice. Add nuts or seeds for added healthy fats and plant protein at each meal. You can add these to yogurt, salads, or vegetable dishes. Marinate fish or vegetables using olive oil, lemon juice, garlic, and fresh herbs. Meal planning Plan to eat a vegetarian meal one day each week. Try to work up to two vegetarian meals, if possible. Eat seafood two or more times a week. Have healthy snacks on hand. These may include: Vegetable sticks with hummus. Greek yogurt. Fruit and nut trail mix. Eat balanced meals. These should include: Fruit: 2-3 servings a day. Vegetables: 4-5 servings a day. Low-fat dairy: 2 servings a day. Fish, poultry, or lean meat: 1 serving a day. Beans and legumes: 2 or more servings a week. Nuts and seeds: 1-2 servings a day. Whole grains: 6-8 servings a day. Extra-virgin olive oil: 3-4 servings a day. Limit red meat and sweets to just a few servings a month. Lifestyle  Try to cook and eat meals with your family. Drink enough fluid to keep your pee (urine) pale yellow. Be active every day. This includes: Aerobic exercise, which is exercise that causes your heart to beat faster. Examples include running and swimming. Leisure activities like gardening, walking, or housework. Get 7-8 hours of sleep each night. Drink red wine if your provider says you can. A glass of wine is 5 oz (150 mL). You may be allowed to have: Up to 1 glass a day if you're female and not pregnant. Up to 2 glasses a day if  you're female. What foods should I eat? Fruits Apples. Apricots. Avocado. Berries. Bananas. Cherries. Dates. Figs. Grapes. Lemons. Melon. Oranges. Peaches. Plums. Pomegranate. Vegetables Artichokes. Beets. Broccoli. Cabbage. Carrots. Eggplant. Green beans. Chard. Kale. Spinach. Onions. Leeks. Peas. Squash. Tomatoes. Peppers. Radishes. Grains Whole-grain pasta. Brown rice. Bulgur wheat. Polenta. Couscous.  Whole-wheat bread. [place] Madeira. Meats and other proteins Beans. Almonds. Sunflower seeds. Pine nuts. Peanuts. Cod. Salmon. Scallops. Shrimp. Tuna. Tilapia. Clams. Oysters. Eggs. Chicken or turkey without skin. Dairy Low-fat milk. Cheese. Greek yogurt. Fats and oils Extra-virgin olive oil. Avocado oil. Grapeseed oil. Beverages Water . Red wine. Herbal tea. Sweets and desserts Greek yogurt with honey. Baked apples. Poached pears. Trail mix. Seasonings and condiments Basil. Cilantro. Coriander. Cumin. Mint. Parsley. Sage. Rosemary. Tarragon. Garlic. Oregano. Thyme. Pepper. Balsamic vinegar. Tahini. Hummus. Tomato sauce. Olives. Mushrooms. The items listed above may not be all the foods and drinks you can have. Talk to a dietitian to learn more. What foods should I limit? This is a list of foods that should be eaten rarely. Fruits Fruit canned in syrup. Vegetables Deep-fried potatoes, like French fries. Grains Packaged pasta or rice dishes. Cereal with added sugar. Snacks with added sugar. Meats and other proteins Beef. Pork. Lamb. Chicken or turkey with skin. Hot dogs. [place]. Dairy Ice cream. Sour cream. Whole milk. Fats and oils Butter. Canola oil. Vegetable oil. Beef fat (tallow). Lard. Beverages Juice. Sugar-sweetened soft drinks. Beer. Liquor and spirits. Sweets and desserts Cookies. Cakes. Pies. Candy. Seasonings and condiments Mayonnaise. Pre-made sauces and marinades. The items listed above may not be all the foods and drinks you should limit. Talk to a dietitian to learn more. Where to find more information American Heart Association (AHA): heart.org This information is not intended to replace advice given to you by your health care provider. Make sure you discuss any questions you have with your health care provider. Document Revised: 10/24/2022 Document Reviewed: 10/24/2022 Elsevier Patient Education  2024 [employer].

## 2023-08-08 NOTE — Assessment & Plan Note (Signed)
 Current use of Arimidex  daily Followed by Dr. Loretha Last mammogram 05/2023:normal Dexa scan completed 12/2021: osteopenia Hx of spontaneous rib fracture-healed Current use of fosamax  and maintain calcium  1200mg  and vit D3 1000IU daily. Advised to maintain annual appts with oncology

## 2023-08-08 NOTE — Progress Notes (Addendum)
 Established Patient Visit  Patient: Paige Foster   DOB: 1965-08-26   58 y.o. Female  MRN: [id number] Visit Date: 08/08/2023  Subjective:    Chief Complaint  Patient presents with   Medical Managment for Chronic Issues     2 week follow up hip pain. PT C/O of hip pain but has improved; she is currently doing physical therapy; vaccinations are due    HPI Recurrent breast cancer, left (HCC) Current use of Arimidex  daily Followed by Dr. Loretha Last mammogram 05/2023:normal Dexa scan completed 12/2021: osteopenia Hx of spontaneous rib fracture-healed Current use of fosamax  and maintain calcium  1200mg  and vit D3 1000IU daily. Advised to maintain annual appts with oncology  Pain of right hip Improving pain and ROM with outpatient PT, mobic , and flexeril  She has had 2 of 16 PT sessions.  Maintain above medications Advised to use tylenol  if needed for pain relief. Plan to return to work after completion of PT sessions Advised to take meds with food and avoid OVER THE COUNTER NSAIDs. F/up in 2months  Pure hypercholesterolemia Atherosclerotic calcification of the aortic arch noted on shoulder x-ray 07/2023. No tobacco use, no DIABETES, HYPERTENSION controlled. Repeat lipid panel today Advised to maintain mediterranean diet and continue lipitor 20mg  at this time  Reviewed medical, surgical, and social history today  Medications: Outpatient Medications Prior to Visit  Medication Sig   alendronate  (FOSAMAX ) 70 MG tablet Take 1 tablet (70 mg total) by mouth every 7 (seven) days. Take with a full glass of water  on an empty stomach.   amLODipine  (NORVASC ) 10 MG tablet Take 1 tablet by mouth daily.   anastrozole  (ARIMIDEX ) 1 MG tablet Take 1 tablet (1 mg total) by mouth daily.   atorvastatin  (LIPITOR) 20 MG tablet Take 1 tablet (20 mg total) by mouth daily.   brimonidine  (ALPHAGAN ) 0.15 % ophthalmic solution 1 drop 2 (two) times daily.   brimonidine  (ALPHAGAN ) 0.2 %  ophthalmic solution SMARTSIG:In Eye(s)   dorzolamide -timolol  (COSOPT ) 2-0.5 % ophthalmic solution 1 drop 2 (two) times daily.   losartan  (COZAAR ) 50 MG tablet TAKE 1 TABLET(50 MG) BY MOUTH DAILY   prednisoLONE  acetate (PRED FORTE ) 1 % ophthalmic suspension Place 1 drop into the left eye 4 (four) times daily.   [DISCONTINUED] cyclobenzaprine  (FLEXERIL ) 5 MG tablet Take 1-2 tablets (5-10 mg total) by mouth at bedtime.   [DISCONTINUED] meloxicam  (MOBIC ) 7.5 MG tablet Take 1 tablet (7.5 mg total) by mouth daily for 10 days.   No facility-administered medications prior to visit.   Reviewed past medical and social history.   ROS per HPI above      Objective:  BP 115/74 (BP Location: Left Arm, Patient Position: Sitting, Cuff Size: Normal)   Pulse 60   Temp 97.9 F (36.6 C) (Temporal)   Resp 18   Wt 139 lb 12.8 oz (63.4 kg)   LMP 06/08/2007 Comment: Just stopped having period  SpO2 99%   BMI 25.57 kg/m      Physical Exam Musculoskeletal:     Right shoulder: Normal.     Thoracic back: Normal.     Lumbar back: Tenderness present. No spasms or bony tenderness. Normal range of motion. Negative right straight leg raise test and negative left straight leg raise test.     Right hip: Tenderness present. No deformity, lacerations, bony tenderness or crepitus. Decreased range of motion. Decreased strength.     Left hip: Normal.  Right upper leg: Normal.     Right knee: Normal.     Comments: Improved right hip ROM: limited external hip rotation and minimal muscle weakness 4/5.     No results found for any visits on 08/08/23.    Assessment & Plan:    Problem List Items Addressed This Visit     Degeneration of intervertebral disc of lumbar region with discogenic back pain and lower extremity pain - Primary   Relevant Medications   cyclobenzaprine  (FLEXERIL ) 5 MG tablet   meloxicam  (MOBIC ) 7.5 MG tablet   Pain of right hip   Improving pain and ROM with outpatient PT, mobic , and  flexeril  She has had 2 of 16 PT sessions.  Maintain above medications Advised to use tylenol  if needed for pain relief. Plan to return to work after completion of PT sessions Advised to take meds with food and avoid OVER THE COUNTER NSAIDs. F/up in 2months      Relevant Medications   cyclobenzaprine  (FLEXERIL ) 5 MG tablet   meloxicam  (MOBIC ) 7.5 MG tablet   Primary osteoarthritis of right hip   Relevant Medications   cyclobenzaprine  (FLEXERIL ) 5 MG tablet   meloxicam  (MOBIC ) 7.5 MG tablet   Pure hypercholesterolemia   Atherosclerotic calcification of the aortic arch noted on shoulder x-ray 07/2023. No tobacco use, no DIABETES, HYPERTENSION controlled. Repeat lipid panel today Advised to maintain mediterranean diet and continue lipitor 20mg  at this time       Relevant Orders   Lipid panel   Recurrent breast cancer, left (HCC)   Current use of Arimidex  daily Followed by Dr. Loretha Last mammogram 05/2023:normal Dexa scan completed 12/2021: osteopenia Hx of spontaneous rib fracture-healed Current use of fosamax  and maintain calcium  1200mg  and vit D3 1000IU daily. Advised to maintain annual appts with oncology      Return in about 2 months (around 10/06/2023) for right hip pain and hyperlipidemia (fasting), HTN.     Roselie Mood, NP

## 2023-08-09 ENCOUNTER — Telehealth: Payer: Self-pay | Admitting: Nurse Practitioner

## 2023-08-09 ENCOUNTER — Encounter: Payer: Self-pay | Admitting: Physical Therapy

## 2023-08-09 ENCOUNTER — Ambulatory Visit: Payer: BC Managed Care – PPO | Admitting: Physical Therapy

## 2023-08-09 DIAGNOSIS — R2689 Other abnormalities of gait and mobility: Secondary | ICD-10-CM

## 2023-08-09 DIAGNOSIS — M25551 Pain in right hip: Secondary | ICD-10-CM

## 2023-08-09 DIAGNOSIS — M6281 Muscle weakness (generalized): Secondary | ICD-10-CM

## 2023-08-09 DIAGNOSIS — Z0279 Encounter for issue of other medical certificate: Secondary | ICD-10-CM

## 2023-08-09 NOTE — Therapy (Signed)
 OUTPATIENT PHYSICAL THERAPY TREATMENT   Patient Name: Paige Foster MRN: [id number] DOB:June 15, 1966, 58 y.o., female Today's Date: 08/09/2023  END OF SESSION:  PT End of Session - 08/09/23 1600     Visit Number 3    Date for PT Re-Evaluation 09/27/23    PT Start Time 1600    PT Stop Time 1645    PT Time Calculation (min) 45 min    Activity Tolerance Patient tolerated treatment well    Behavior During Therapy [facility] for tasks assessed/performed              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    Recurrent breast cancer, left [facility]) oncologist-- dr layla    dx 2004, noninvasive Stage 0 ----s/p left mastectomy w/ tram flap construction (and right breast reduction), taken Tamoxifen for 3 yrs;   08-30-2018 recurrent left cancer , Grade III,  cT1c,  ER positive, PR negative, HER-2 positive, invasive ductal carcinoma-- neoadjuvant chemo to start 09-26-2018   Renal artery stenosis (HCC)    mild right external renal artery stenosis per duplex in epic 08-09-2013   Wears glasses    Past Surgical History:  Procedure Laterality Date   BREAST LUMPECTOMY WITH RADIOACTIVE SEED LOCALIZATION Left 02/20/2019   Procedure: LEFT BREAST LUMPECTOMY WITH RADIOACTIVE SEED LOCALIZATION;  Surgeon: Ebbie Cough, MD;  Location: [facility] OR;  Service: General;  Laterality: Left;   BREAST SURGERY Left    Transflap   COLONOSCOPY     COLONOSCOPY     MASTECTOMY Left 2004   w/  TRAM flap construction and right breast augmentation with abdominoplasy   PARS PLANA VITRECTOMY Left 12/18/2018   Procedure: PARS PLANA VITRECTOMY WITH 25 GAUGE, ENDOLASER;  Surgeon: Tobie Baptist, MD;  Location: [facility] OR;  Service: Ophthalmology;  Laterality: Left;   PORTACATH PLACEMENT N/A 09/25/2018   Procedure: INSERTION PORT-A-CATH WITH ULTRASOUND;  Surgeon: Ebbie Cough, MD;   Location: [facility] ORS;  Service: General;  Laterality: N/A;   TUBAL LIGATION Bilateral yrs ago   Patient Active Problem List   Diagnosis Date Noted   Degeneration of intervertebral disc of lumbar region with discogenic back pain and lower extremity pain 07/26/2023   Primary osteoarthritis of right hip 07/26/2023   Pain of right hip 07/26/2023   Bulging eyes 07/04/2023   Use of anastrozole  (Arimidex ) 07/04/2023   Pure hypercholesterolemia 11/17/2021   Anemia 11/17/2021   Osteopenia 08/16/2019   Genetic testing 02/05/2019   Port-A-Cath in place 09/28/2018   Recurrent breast cancer, left (HCC) 09/07/2018   Malignant neoplasm of upper-outer quadrant of left breast in female, estrogen receptor positive (HCC) 09/06/2018   HTN (hypertension) 07/10/2013   HEMORRHOIDS-INTERNAL 04/23/2009   History of colonic polyps 04/23/2009   HEMORRHOID, THROMBOSED 03/12/2009    PCP: Katheen Roselie Rockford, NP  REFERRING PROVIDER: Katheen Roselie Rockford, NP  REFERRING DIAG: M25.551 (ICD-10-CM) - Pain of right hip  THERAPY DIAG:  Pain in right hip  Other abnormalities of gait and mobility  Muscle weakness (generalized)  Rationale for Evaluation and Treatment: Rehabilitation  ONSET DATE: ~2-3 weeks  SUBJECTIVE:   SUBJECTIVE STATEMENT: Having some resistance in the R leg Feeling ok  PERTINENT HISTORY: No prior injuries  PAIN:  Are you having pain? Yes: NPRS scale: 0/10  Pain location: R groin area and back of  hip Pain description: Pull Aggravating factors: putting weight on it Relieving factors: heat  PRECAUTIONS: None  RED FLAGS: None   WEIGHT BEARING RESTRICTIONS: No  FALLS:  Has patient fallen in last 6 months? No  LIVING ENVIRONMENT: Lives with: lives with their spouse Lives in: House/apartment Stairs: Yes: Internal: 10 steps; on right going up Has following equipment at home: Quad cane small base  OCCUPATION: Works at [employer] so a lot of standing and lifting  PLOF:  Independent  PATIENT GOALS: Be able to put weight on her R leg without pain  NEXT MD VISIT: n/a  OBJECTIVE:  Note: Objective measures were completed at Evaluation unless otherwise noted.  DIAGNOSTIC FINDINGS: 07/26/23 Lumbar IMPRESSION: Multilevel degenerative changes as noted above. No acute abnormality seen.  07/26/23 Hip IMPRESSION: Minimal degenerative joint disease of right hip. No acute abnormality seen.    PATIENT SURVEYS:  FOTO 29; predicted 70  COGNITION: Overall cognitive status: Within functional limits for tasks assessed     SENSATION: WFL  EDEMA:  None  MUSCLE LENGTH: Hamstrings: Right >95 deg; Left 90 deg Thomas test: Right tighter than Left   POSTURE:  Maintains R hip and knee flexed in standing  PALPATION: Most TTP along R piriformis and glute  LOWER EXTREMITY ROM: PROM all WNL   LOWER EXTREMITY MMT:  MMT Right eval Left eval  Hip flexion 3+* 4  Hip extension 2 3  Hip abduction 3 4  Hip adduction    Hip internal rotation    Hip external rotation    Knee flexion 5 5  Knee extension 3+ 5  Ankle dorsiflexion    Ankle plantarflexion    Ankle inversion    Ankle eversion     (Blank rows = not tested, * = pain)  LOWER EXTREMITY SPECIAL TESTS:  Hip special tests: Belvie (FABER) test: positive , Trendelenburg test: positive , Thomas test: positive , and Anterior hip impingement test: positive   FUNCTIONAL TESTS:  5 times sit to stand: 16.03 sec  GAIT: Distance walked: Into clinic Assistive device utilized: Quad cane small base Level of assistance: SBA and provided cueing for proper use of her quad cane Comments: Antalgic, reduced R LE stance time, R knee and hip flexed during stance, R hip ER with reduced heel strike/toe off                                                                                                                                TREATMENT DATE:  08/09/23 NuStep L5 x 6 min S2S from mat table 2x10 Standing march  2x10  Side step length of mat table  HS curls 20lb 2x10 Leg Ext 5lb 2x10 Gait 3 small laps ~ 116ft no AD  4in step ups HHA x1  x10 each    08/04/23 Nustep L4 x 5 min, LEs only due to R UE hurting Manual therapy:  Grade II to III hip mobilization for ext, flexion  and lateral distraction to decrease anterior hip impingement  Manual stretch for glutes/piriformis Supine  Piriformis x30 PPT + marching x10 Glute set with leg extended x10 SLR + PPT 2x10 Bridge 2x10  Clamshell green TB 2x10 R&L Kneel to tall kneel 2x10 Staggered sit to stand 2x10 Staggered stance rock fwd/bwd x10 Forward lunge on 8 step 2x10  08/02/23 See HEP below    PATIENT EDUCATION:  Education details: Exam findings, POC, initial HEP Person educated: Patient Education method: Explanation, Demonstration, and Handouts Education comprehension: verbalized understanding, returned demonstration, and needs further education  HOME EXERCISE PROGRAM: Access Code: [email or web address] URL: [email or web address] Date: 08/02/2023 Prepared by: Gellen April Earnie Starring  Exercises - Supine Bridge  - 1 x daily - 7 x weekly - 3 sets - 10 reps - Hooklying Isometric Clamshell  - 1 x daily - 7 x weekly - 3 sets - 10 reps - Supine Quad Set  - 1 x daily - 7 x weekly - 3 sets - 10 reps - Supine Active Straight Leg Raise  - 1 x daily - 7 x weekly - 3 sets - 10 reps - Supine Piriformis Stretch with Foot on Ground  - 1 x daily - 7 x weekly - 2 sets - 30 sec hold  ASSESSMENT:  CLINICAL IMPRESSION: Patient is a 58 y.o. F who was seen today for physical therapy treatment for R hip pain. She enters today with reports of resiance in her RLE. Treatment session focused on function. Trying to get pt to put weight through RLE and trust it. She is hesitant with mobility without AD but was able to complete session without it. Cue for reciprocal arm swing an to increase gait speed with ambulation. No issue with machine level  interventions.Overall pt did well during session.  OBJECTIVE IMPAIRMENTS: Abnormal gait, decreased activity tolerance, decreased balance, decreased endurance, decreased mobility, difficulty walking, decreased strength, increased fascial restrictions, increased muscle spasms, improper body mechanics, postural dysfunction, and pain.   ACTIVITY LIMITATIONS: lifting, standing, stairs, transfers, and locomotion level  PARTICIPATION LIMITATIONS: cleaning, laundry, shopping, community activity, and occupation  PERSONAL FACTORS: Fitness, Past/current experiences, and Time since onset of injury/illness/exacerbation are also affecting patient's functional outcome.   REHAB POTENTIAL: Good  CLINICAL DECISION MAKING: Evolving/moderate complexity  EVALUATION COMPLEXITY: Moderate   GOALS: Goals reviewed with patient? Yes  SHORT TERM GOALS: Target date: 08/30/2023  Pt will be ind with initial HEP Baseline: Goal status: INITIAL  2.  Pt will report >/=50% improvement in pain Baseline:  Goal status: INITIAL  3.  Pt will be able to perform 5x STS in </=13 sec Baseline:  Goal status: INITIAL   LONG TERM GOALS: Target date: 09/27/2023   Pt will be ind with management and progression of HEP Baseline:  Goal status: INITIAL  2.  Pt will be able to amb 1000' ind with normal reciprocal gait for community mobility Baseline: Currently using quad cane Goal status: INITIAL  3.  Pt will be able to lift and carry at least 25# for work related tasks with </=2/10 hip pain Baseline:  Goal status: INITIAL  4.  Pt will be able to perform R SLS for >=20 sec to demo increased stability Baseline:  Goal status: INITIAL    PLAN:  PT FREQUENCY: 2x/week  PT DURATION: 8 weeks  PLANNED INTERVENTIONS: 97164- PT Re-evaluation, 97110-Therapeutic exercises, 97530- Therapeutic activity, [id number]- Neuromuscular re-education, 97535- Self Care, [id number]- Manual therapy, [id number]- Gait training, [id number]- Aquatic Therapy,  97014- Electrical stimulation (unattended), [id number]- Ionotophoresis 4mg /ml  Dexamethasone , Patient/Family education, Balance training, Stair training, Taping, Dry Needling, Joint mobilization, Cryotherapy, and Moist heat  PLAN FOR NEXT SESSION: Assess response active PT session. Work on R hip strength/stability --  weight bearing as pt tolerates. Gait train with quad cane.    Tanda KANDICE Sorrow, PTA 08/09/2023, 4:01 PM

## 2023-08-09 NOTE — Telephone Encounter (Signed)
 Patient was called and notified.

## 2023-08-09 NOTE — Telephone Encounter (Signed)
 PAPERWORK/FORMS received  Dropped off by: Pt Call back #: [phone] Individual made aware of 3-5 business day turn around (YES/NO): Y GREEN charge sheet completed and patient made aware of possible charge (YES/NO): Y Placed in provider folder at front desk. ~~~ route to CMA/provider Team  CLINICAL USE BELOW THIS LINE (use X to signify action taken)  ___ Form received and placed in providers office for signature. ___ Form completed and faxed to LOA Dept.  ___ Form completed & LVM to notify patient ready for pick up.  ___ Charge sheet and copy of form in front office folder for office supervisor.

## 2023-08-09 NOTE — Telephone Encounter (Signed)
 FMLA form was complete and faxed on 08/09/2023

## 2023-08-09 NOTE — Telephone Encounter (Signed)
 Copied from CRM [id number]. Topic: General - Other >> Aug 09, 2023 11:13 AM Paige Foster wrote: Reason for CRM: Patient has questions in regard to her FMLA  paperwork, she is requesting an extended date to go back to work - states she's not feeling well & is still attending physical therapy. Patient states the return to work date should've been March 11.

## 2023-08-09 NOTE — Telephone Encounter (Signed)
 Paperwork was placed in [place] sign and review folder.

## 2023-08-11 ENCOUNTER — Ambulatory Visit: Payer: BC Managed Care – PPO | Admitting: Physical Therapy

## 2023-08-11 DIAGNOSIS — M6281 Muscle weakness (generalized): Secondary | ICD-10-CM

## 2023-08-11 DIAGNOSIS — M25551 Pain in right hip: Secondary | ICD-10-CM

## 2023-08-11 DIAGNOSIS — R2689 Other abnormalities of gait and mobility: Secondary | ICD-10-CM

## 2023-08-11 NOTE — Therapy (Signed)
OUTPATIENT PHYSICAL THERAPY TREATMENT   Patient Name: Paige Foster MRN: [id number] DOB:[birth year], 58 y.o., female Today's Date: 08/11/2023  END OF SESSION:  PT End of Session - 08/11/23 1602     Visit Number 4    Number of Visits 16    Date for PT Re-Evaluation 09/27/23    Authorization Type BCBS    PT Start Time 1602    PT Stop Time 1640    PT Time Calculation (min) 38 min    Activity Tolerance Patient tolerated treatment well    Behavior During Therapy [facility] for tasks assessed/performed               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    Recurrent breast cancer, left [facility]) oncologist-- dr Darnelle Catalan    dx 2004, noninvasive Stage 0 ----s/p left mastectomy w/ tram flap construction (and right breast reduction), taken Tamoxifen for 3 yrs;   08-30-2018 recurrent left cancer , Grade III,  cT1c,  ER positive, PR negative, HER-2 positive, invasive ductal carcinoma-- neoadjuvant chemo to start 09-26-2018   Renal artery stenosis (HCC)    mild right external renal artery stenosis per duplex in epic 08-09-2013   Wears glasses    Past Surgical History:  Procedure Laterality Date   BREAST LUMPECTOMY WITH RADIOACTIVE SEED LOCALIZATION Left 02/20/2019   Procedure: LEFT BREAST LUMPECTOMY WITH RADIOACTIVE SEED LOCALIZATION;  Surgeon: Emelia Loron, MD;  Location: [facility] OR;  Service: General;  Laterality: Left;   BREAST SURGERY Left    Transflap   COLONOSCOPY     COLONOSCOPY     MASTECTOMY Left 2004   w/  TRAM flap construction and right breast augmentation with abdominoplasy   PARS PLANA VITRECTOMY Left 12/18/2018   Procedure: PARS PLANA VITRECTOMY WITH 25 GAUGE, ENDOLASER;  Surgeon: Carmela Rima, MD;  Location: [facility] OR;  Service: Ophthalmology;  Laterality: Left;   PORTACATH PLACEMENT N/A 09/25/2018   Procedure: INSERTION  PORT-A-CATH WITH ULTRASOUND;  Surgeon: Emelia Loron, MD;  Location: [facility] ORS;  Service: General;  Laterality: N/A;   TUBAL LIGATION Bilateral yrs ago   Patient Active Problem List   Diagnosis Date Noted   Degeneration of intervertebral disc of lumbar region with discogenic back pain and lower extremity pain 07/26/2023   Primary osteoarthritis of right hip 07/26/2023   Pain of right hip 07/26/2023   Bulging eyes 07/04/2023   Use of anastrozole (Arimidex) 07/04/2023   Pure hypercholesterolemia 11/17/2021   Anemia 11/17/2021   Osteopenia 08/16/2019   Genetic testing 02/05/2019   Port-A-Cath in place 09/28/2018   Recurrent breast cancer, left (HCC) 09/07/2018   Malignant neoplasm of upper-outer quadrant of left breast in female, estrogen receptor positive (HCC) 09/06/2018   HTN (hypertension) 07/10/2013   HEMORRHOIDS-INTERNAL 04/23/2009   History of colonic polyps 04/23/2009   HEMORRHOID, THROMBOSED 03/12/2009    PCP: Anne Ng, NP  REFERRING PROVIDER: Anne Ng, NP  REFERRING DIAG: M25.551 (ICD-10-CM) - Pain of right hip  THERAPY DIAG:  Pain in right hip  Other abnormalities of gait and mobility  Muscle weakness (generalized)  Rationale for Evaluation and Treatment: Rehabilitation  ONSET DATE: ~2-3 weeks  SUBJECTIVE:   SUBJECTIVE STATEMENT: Pt states she feels the "resistance" feeling is decreasing. Has been trying to walk some without the cane.   PERTINENT  HISTORY: No prior injuries  PAIN:  Are you having pain? Yes: NPRS scale: 0/10  Pain location: R groin area and back of hip Pain description: Pull Aggravating factors: putting weight on it Relieving factors: heat  PRECAUTIONS: None  RED FLAGS: None   WEIGHT BEARING RESTRICTIONS: No  FALLS:  Has patient fallen in last 6 months? No  LIVING ENVIRONMENT: Lives with: lives with their spouse Lives in: House/apartment Stairs: Yes: Internal: 10 steps; on right going up Has following  equipment at home: Quad cane small base  OCCUPATION: Works at [employer] so a lot of standing and lifting  PLOF: Independent  PATIENT GOALS: Be able to put weight on her R leg without pain  NEXT MD VISIT: n/a  OBJECTIVE:  Note: Objective measures were completed at Evaluation unless otherwise noted.  DIAGNOSTIC FINDINGS: 07/26/23 Lumbar IMPRESSION: Multilevel degenerative changes as noted above. No acute abnormality seen.  07/26/23 Hip IMPRESSION: Minimal degenerative joint disease of right hip. No acute abnormality seen.    PATIENT SURVEYS:  FOTO 29; predicted 1  COGNITION: Overall cognitive status: Within functional limits for tasks assessed     SENSATION: WFL  EDEMA:  None  MUSCLE LENGTH: Hamstrings: Right >95 deg; Left 90 deg Thomas test: Right tighter than Left   POSTURE:  Maintains R hip and knee flexed in standing  PALPATION: Most TTP along R piriformis and glute  LOWER EXTREMITY ROM: PROM all WNL   LOWER EXTREMITY MMT:  MMT Right eval Left eval  Hip flexion 3+* 4  Hip extension 2 3  Hip abduction 3 4  Hip adduction    Hip internal rotation    Hip external rotation    Knee flexion 5 5  Knee extension 3+ 5  Ankle dorsiflexion    Ankle plantarflexion    Ankle inversion    Ankle eversion     (Blank rows = not tested, * = pain)  LOWER EXTREMITY SPECIAL TESTS:  Hip special tests: Luisa Hart (FABER) test: positive , Trendelenburg test: positive , Thomas test: positive , and Anterior hip impingement test: positive   FUNCTIONAL TESTS:  5 times sit to stand: 16.03 sec  GAIT: Distance walked: Into clinic Assistive device utilized: Quad cane small base Level of assistance: SBA and provided cueing for proper use of her quad cane Comments: Antalgic, reduced R LE stance time, R knee and hip flexed during stance, R hip ER with reduced heel strike/toe off                                                                                                                                 TREATMENT DATE:  08/11/23 Recumbent bike L3 x 5 min Supine  Thomas stretch 2x 30"  Butterfly stretch x 30"  SKTC x30" Bridge walk outs x10 PPT + marching 2x10 SLR + PPT 2x10 Sidelying  Clamshell red TB 2x10  Hip ext 3x10 Standing  Hip flexor stretch x30"  SLR  2x10 stabilizing on R LE  Wall squat 2x10 Amb no a/d with cues to keep hip/trunk extended especially during R stance phase   08/09/23 NuStep L5 x 6 min S2S from mat table 2x10 Standing march 2x10  Side step length of mat table  HS curls 20lb 2x10 Leg Ext 5lb 2x10 Gait 3 small laps ~ 161ft no AD  4in step ups HHA x1  x10 each    08/04/23 Nustep L4 x 5 min, LEs only due to R UE hurting Manual therapy:  Grade II to III hip mobilization for ext, flexion and lateral distraction to decrease anterior hip impingement  Manual stretch for glutes/piriformis Supine  Piriformis x30" PPT + marching x10 Glute set with leg extended x10 SLR + PPT 2x10 Bridge 2x10  Clamshell green TB 2x10 R&L Kneel to tall kneel 2x10 Staggered sit to stand 2x10 Staggered stance rock fwd/bwd x10 Forward lunge on 8" step 2x10  08/02/23 See HEP below    PATIENT EDUCATION:  Education details: Exam findings, POC, initial HEP Person educated: Patient Education method: Explanation, Demonstration, and Handouts Education comprehension: verbalized understanding, returned demonstration, and needs further education  HOME EXERCISE PROGRAM: Access Code: [email or web address] URL: [email or web address] Date: 08/02/2023 Prepared by: Vernon Prey April Kirstie Peri  Exercises - Supine Bridge  - 1 x daily - 7 x weekly - 3 sets - 10 reps - Hooklying Isometric Clamshell  - 1 x daily - 7 x weekly - 3 sets - 10 reps - Supine Quad Set  - 1 x daily - 7 x weekly - 3 sets - 10 reps - Supine Active Straight Leg Raise  - 1 x daily - 7 x weekly - 3 sets - 10 reps - Supine Piriformis Stretch with Foot on Ground  - 1 x daily - 7 x  weekly - 2 sets - 30 sec hold  ASSESSMENT:  CLINICAL IMPRESSION: Patient is a 58 y.o. F who was seen today for physical therapy treatment for R hip pain. Pt reports her "resistance" feeling is decreasing. Continued to work on hip strength/stability. Updated HEP to progress her strength at home. Still maintains hip flexed during stance phase of gait, discussed trying to keep her hips extended.   OBJECTIVE IMPAIRMENTS: Abnormal gait, decreased activity tolerance, decreased balance, decreased endurance, decreased mobility, difficulty walking, decreased strength, increased fascial restrictions, increased muscle spasms, improper body mechanics, postural dysfunction, and pain.   ACTIVITY LIMITATIONS: lifting, standing, stairs, transfers, and locomotion level  PARTICIPATION LIMITATIONS: cleaning, laundry, shopping, community activity, and occupation  PERSONAL FACTORS: Fitness, Past/current experiences, and Time since onset of injury/illness/exacerbation are also affecting patient's functional outcome.   REHAB POTENTIAL: Good  CLINICAL DECISION MAKING: Evolving/moderate complexity  EVALUATION COMPLEXITY: Moderate   GOALS: Goals reviewed with patient? Yes  SHORT TERM GOALS: Target date: 08/30/2023  Pt will be ind with initial HEP Baseline: Goal status: INITIAL  2.  Pt will report >/=50% improvement in pain Baseline:  Goal status: INITIAL  3.  Pt will be able to perform 5x STS in </=13 sec Baseline:  Goal status: INITIAL   LONG TERM GOALS: Target date: 09/27/2023   Pt will be ind with management and progression of HEP Baseline:  Goal status: INITIAL  2.  Pt will be able to amb 1000' ind with normal reciprocal gait for community mobility Baseline: Currently using quad cane Goal status: INITIAL  3.  Pt will be able to lift and carry at least 25# for work related tasks with </=2/10 hip pain Baseline:  Goal status: INITIAL  4.  Pt will be able to perform R SLS for >=20 sec to  demo increased stability Baseline:  Goal status: INITIAL    PLAN:  PT FREQUENCY: 2x/week  PT DURATION: 8 weeks  PLANNED INTERVENTIONS: 97164- PT Re-evaluation, 97110-Therapeutic exercises, 97530- Therapeutic activity, [id number]- Neuromuscular re-education, 97535- Self Care, 86578- Manual therapy, [id number]- Gait training, [phone]- Aquatic Therapy, 97014- Electrical stimulation (unattended), [phone]- Ionotophoresis 4mg /ml Dexamethasone, Patient/Family education, Balance training, Stair training, Taping, Dry Needling, Joint mobilization, Cryotherapy, and Moist heat  PLAN FOR NEXT SESSION: Assess response active PT session. Work on R hip strength/stability --  weight bearing as pt tolerates. Gait train with quad cane.    Mieczyslaw Stamas April Ma L Lisa-Marie Rueger, PT 08/11/2023, 4:02 PM

## 2023-08-29 ENCOUNTER — Encounter: Payer: Self-pay | Admitting: Nurse Practitioner

## 2023-08-29 ENCOUNTER — Ambulatory Visit: Payer: BC Managed Care – PPO | Admitting: Nurse Practitioner

## 2023-08-29 VITALS — BP 116/78 | HR 87 | Temp 98.6°F | Ht 62.0 in | Wt 135.0 lb

## 2023-08-29 DIAGNOSIS — J0100 Acute maxillary sinusitis, unspecified: Secondary | ICD-10-CM | POA: Diagnosis not present

## 2023-08-29 DIAGNOSIS — M1611 Unilateral primary osteoarthritis, right hip: Secondary | ICD-10-CM | POA: Diagnosis not present

## 2023-08-29 DIAGNOSIS — M25551 Pain in right hip: Secondary | ICD-10-CM | POA: Diagnosis not present

## 2023-08-29 MED ORDER — LORATADINE 10 MG PO TABS: 10.0000 mg | ORAL_TABLET | Freq: Every day | ORAL | 11 refills | Status: DC

## 2023-08-29 MED ORDER — AZITHROMYCIN 250 MG PO TABS: 250.0000 mg | ORAL_TABLET | Freq: Every day | ORAL | 0 refills | Status: DC

## 2023-08-29 MED ORDER — AZELASTINE HCL 0.1 % NA SOLN: 1.0000 | Freq: Two times a day (BID) | NASAL | 0 refills | Status: DC

## 2023-08-29 NOTE — Progress Notes (Signed)
Established Patient Visit  Patient: Paige Foster   DOB: October 13, 1965   58 y.o. Female  MRN: [id number] Visit Date: 08/29/2023  Subjective:    Chief Complaint  Patient presents with   Sinusitis    Sinus drainage on left nostril for 3 weeks, nausea, vomiting, diarrhea-last on Friday   Sinusitis This is a new problem. The current episode started 1 to 4 weeks ago. The problem is unchanged. There has been no fever. Associated symptoms include congestion, headaches and sinus pressure. Pertinent negatives include no chills, coughing, diaphoresis, ear pain, hoarse voice, neck pain, shortness of breath, sneezing, sore throat or swollen glands. Past treatments include oral decongestants. The treatment provided mild relief.  Reports nausea and diarrhea with post nasal drainage. She denies any ABDOMEN pain or melena or hematochezia.  Pain of right hip Secondary to OA.  She reports significant improvement with ongoing outpatient PT and use of mobic. She has completed sessions. She wants to return to work on 09/12/2023, even though she has not been released by PT. She opted to return to work without restriction and to discontinue PT sessions due to financial difficulty. She plans to continue home exercises.  Work letter provided   Reviewed medical, surgical, and social history today  Medications: Outpatient Medications Prior to Visit  Medication Sig   alendronate (FOSAMAX) 70 MG tablet Take 1 tablet (70 mg total) by mouth every 7 (seven) days. Take with a full glass of water on an empty stomach.   amLODipine (NORVASC) 10 MG tablet Take 1 tablet by mouth daily.   anastrozole (ARIMIDEX) 1 MG tablet Take 1 tablet (1 mg total) by mouth daily.   atorvastatin (LIPITOR) 20 MG tablet Take 1 tablet (20 mg total) by mouth daily.   brimonidine (ALPHAGAN) 0.15 % ophthalmic solution 1 drop 2 (two) times daily.   brimonidine (ALPHAGAN) 0.2 % ophthalmic solution SMARTSIG:In Eye(s)    dorzolamide-timolol (COSOPT) 2-0.5 % ophthalmic solution 1 drop 2 (two) times daily.   losartan (COZAAR) 50 MG tablet TAKE 1 TABLET(50 MG) BY MOUTH DAILY   meloxicam (MOBIC) 7.5 MG tablet Take 1 tablet (7.5 mg total) by mouth daily. Take with food   [DISCONTINUED] cyclobenzaprine (FLEXERIL) 5 MG tablet Take 1 tablet (5 mg total) by mouth at bedtime. (Patient not taking: Reported on 08/29/2023)   [DISCONTINUED] prednisoLONE acetate (PRED FORTE) 1 % ophthalmic suspension Place 1 drop into the left eye 4 (four) times daily. (Patient not taking: Reported on 08/29/2023)   No facility-administered medications prior to visit.   Reviewed past medical and social history.   ROS per HPI above      Objective:  BP 116/78 (BP Location: Left Arm, Patient Position: Sitting)   Pulse 87   Temp 98.6 F (37 C)   Ht 5\' 2"  (1.575 m)   Wt 135 lb (61.2 kg)   LMP 06/08/2007 Comment: Just stopped having period  SpO2 97%   BMI 24.69 kg/m      Physical Exam Vitals and nursing note reviewed.  HENT:     Nose: Mucosal edema, congestion and rhinorrhea present.     Right Nostril: No occlusion.     Left Nostril: No occlusion.     Right Turbinates: Enlarged and swollen.     Left Turbinates: Enlarged and swollen.     Right Sinus: Maxillary sinus tenderness and frontal sinus tenderness present.     Left Sinus: Maxillary sinus tenderness  and frontal sinus tenderness present.     Mouth/Throat:     Pharynx: Postnasal drip present. No posterior oropharyngeal erythema.  Cardiovascular:     Rate and Rhythm: Normal rate.     Pulses: Normal pulses.  Pulmonary:     Effort: Pulmonary effort is normal.  Musculoskeletal:     Lumbar back: Normal.     Right hip: No tenderness. Normal range of motion. Decreased strength.     Left hip: Normal.     Right upper leg: Normal.     Right knee: Normal.  Neurological:     Mental Status: She is oriented to person, place, and time.     No results found for any visits on  08/29/23.    Assessment & Plan:    Problem List Items Addressed This Visit     Pain of right hip   Secondary to OA.  She reports significant improvement with ongoing outpatient PT and use of mobic. She has completed sessions. She wants to return to work on 09/12/2023, even though she has not been released by PT. She opted to return to work without restriction and to discontinue PT sessions due to financial difficulty. She plans to continue home exercises.  Work letter provided      Primary osteoarthritis of right hip - Primary   Other Visit Diagnoses       Acute non-recurrent maxillary sinusitis       Relevant Medications   azelastine (ASTELIN) 0.1 % nasal spray   loratadine (CLARITIN) 10 MG tablet   azithromycin (ZITHROMAX Z-PAK) 250 MG tablet      Return if symptoms worsen or fail to improve.     Alysia Penna, NP

## 2023-08-29 NOTE — Assessment & Plan Note (Signed)
Secondary to OA.  She reports significant improvement with ongoing outpatient PT and use of mobic. She has completed sessions. She wants to return to work on 09/12/2023, even though she has not been released by PT. She opted to return to work without restriction and to discontinue PT sessions due to financial difficulty. She plans to continue home exercises.  Work Physicist, medical provided

## 2023-08-29 NOTE — Patient Instructions (Signed)
Maintain adequate oral hydration. Take meds with food. Start curturelle probiotic 1cap daily at noon x 2weeks Start azithromycin  as directed on bottle, in AM x 5days Start claritin 10mg  in PM x 1-2weeks Call office if no improvement in 10days

## 2023-09-06 ENCOUNTER — Encounter: Payer: Self-pay | Admitting: Nurse Practitioner

## 2023-09-06 DIAGNOSIS — R112 Nausea with vomiting, unspecified: Secondary | ICD-10-CM

## 2023-09-16 ENCOUNTER — Telehealth: Payer: Self-pay

## 2023-09-16 ENCOUNTER — Telehealth: Payer: Self-pay | Admitting: Nurse Practitioner

## 2023-09-16 NOTE — Telephone Encounter (Signed)
Copied from CRM [id number]. Topic: Clinical - Medical Advice >> Sep 16, 2023 12:02 PM Deaijah H wrote: Reason for CRM: Patient called in stating the Mucus is taking away appetite, making sick , messing with gut looking for medical advice / please call [phone]

## 2023-09-16 NOTE — Telephone Encounter (Signed)
Patient is requesting medication for her stomach issues and her tatse buds are off she stated that if she eat meat her food doesn't digest

## 2023-09-16 NOTE — Telephone Encounter (Signed)
Left patient a detailed voice message to return call to office for her symptoms and that I would send a message via mychart as well.

## 2023-09-23 ENCOUNTER — Other Ambulatory Visit: Payer: Self-pay | Admitting: Hematology and Oncology

## 2023-09-27 ENCOUNTER — Other Ambulatory Visit: Payer: Self-pay | Admitting: Nurse Practitioner

## 2023-09-27 DIAGNOSIS — M51362 Other intervertebral disc degeneration, lumbar region with discogenic back pain and lower extremity pain: Secondary | ICD-10-CM

## 2023-09-27 DIAGNOSIS — M1611 Unilateral primary osteoarthritis, right hip: Secondary | ICD-10-CM

## 2023-09-27 DIAGNOSIS — M25551 Pain in right hip: Secondary | ICD-10-CM

## 2023-09-28 MED ORDER — OMEPRAZOLE 20 MG PO CPDR: 20.0000 mg | DELAYED_RELEASE_CAPSULE | Freq: Every day | ORAL | Status: DC

## 2023-09-30 ENCOUNTER — Other Ambulatory Visit: Payer: Self-pay | Admitting: Nurse Practitioner

## 2023-09-30 DIAGNOSIS — M8588 Other specified disorders of bone density and structure, other site: Secondary | ICD-10-CM

## 2023-09-30 DIAGNOSIS — I10 Essential (primary) hypertension: Secondary | ICD-10-CM

## 2023-09-30 DIAGNOSIS — Z79811 Long term (current) use of aromatase inhibitors: Secondary | ICD-10-CM

## 2023-09-30 MED ORDER — AMLODIPINE BESYLATE 10 MG PO TABS: ORAL_TABLET | ORAL | 2 refills | Status: AC

## 2023-09-30 MED ORDER — ALENDRONATE SODIUM 70 MG PO TABS: 70.0000 mg | ORAL_TABLET | ORAL | 3 refills | Status: DC

## 2023-09-30 MED ORDER — LOSARTAN POTASSIUM 50 MG PO TABS: ORAL_TABLET | ORAL | 3 refills | Status: AC

## 2023-09-30 NOTE — Telephone Encounter (Signed)
 Copied from CRM [id number]. Topic: Clinical - Medication Refill >> Sep 30, 2023 12:11 PM Eunice Blase wrote: Most Recent Primary Care Visit:  Provider: Anne Ng  Department: [facility]  Visit Type: OFFICE VISIT  Date: 08/29/2023  Medication: losartan (COZAAR) 50 MG tablet  Has the patient contacted their pharmacy? Yes (Agent: If no, request that the patient contact the pharmacy for the refill. If patient does not wish to contact the pharmacy document the reason why and proceed with request.) (Agent: If yes, when and what did the pharmacy advise?)Pharmacy needs approval from PCP  Is this the correct pharmacy for this prescription? Yes If no, delete pharmacy and type the correct one.  This is the patient's preferred pharmacy:  [id number] [place], [place] - [address] AT [facility] [address] [place] [address] Phone: [phone] Fax: [phone]   Has the prescription been filled recently? Yes  Is the patient out of the medication? Yes  Has the patient been seen for an appointment in the last year OR does the patient have an upcoming appointment? Yes  Can we respond through MyChart? Yes  Agent: Please be advised that Rx refills may take up to 3 business days. We ask that you follow-up with your pharmacy.

## 2023-10-17 ENCOUNTER — Encounter: Payer: Self-pay | Admitting: Nurse Practitioner

## 2023-10-17 ENCOUNTER — Ambulatory Visit (INDEPENDENT_AMBULATORY_CARE_PROVIDER_SITE_OTHER)

## 2023-10-17 ENCOUNTER — Ambulatory Visit (INDEPENDENT_AMBULATORY_CARE_PROVIDER_SITE_OTHER): Payer: BC Managed Care – PPO | Admitting: Nurse Practitioner

## 2023-10-17 VITALS — BP 118/76 | HR 68 | Temp 98.1°F | Ht 64.0 in | Wt 132.6 lb

## 2023-10-17 DIAGNOSIS — R1013 Epigastric pain: Secondary | ICD-10-CM | POA: Diagnosis not present

## 2023-10-17 DIAGNOSIS — R0982 Postnasal drip: Secondary | ICD-10-CM | POA: Diagnosis not present

## 2023-10-17 DIAGNOSIS — R7989 Other specified abnormal findings of blood chemistry: Secondary | ICD-10-CM | POA: Diagnosis not present

## 2023-10-17 DIAGNOSIS — R634 Abnormal weight loss: Secondary | ICD-10-CM

## 2023-10-17 DIAGNOSIS — R918 Other nonspecific abnormal finding of lung field: Secondary | ICD-10-CM | POA: Diagnosis not present

## 2023-10-17 DIAGNOSIS — M899 Disorder of bone, unspecified: Secondary | ICD-10-CM

## 2023-10-17 LAB — HEPATIC FUNCTION PANEL
ALT: 38 U/L — ABNORMAL HIGH (ref 0–35)
AST: 43 U/L — ABNORMAL HIGH (ref 0–37)
Albumin: 4 g/dL (ref 3.5–5.2)
Alkaline Phosphatase: 173 U/L — ABNORMAL HIGH (ref 39–117)
Bilirubin, Direct: 0.1 mg/dL (ref 0.0–0.3)
Total Bilirubin: 0.4 mg/dL (ref 0.2–1.2)
Total Protein: 6.9 g/dL (ref 6.0–8.3)

## 2023-10-17 LAB — RENAL FUNCTION PANEL
Albumin: 4 g/dL (ref 3.5–5.2)
BUN: 10 mg/dL (ref 6–23)
CO2: 26 meq/L (ref 19–32)
Calcium: 9.3 mg/dL (ref 8.4–10.5)
Chloride: 103 meq/L (ref 96–112)
Creatinine, Ser: 0.75 mg/dL (ref 0.40–1.20)
GFR: 87.3 mL/min (ref >60.00)
Glucose, Bld: 99 mg/dL (ref 70–99)
Phosphorus: 3.1 mg/dL (ref 2.3–4.6)
Potassium: 3.6 meq/L (ref 3.5–5.1)
Sodium: 136 meq/L (ref 135–145)

## 2023-10-17 LAB — LIPASE: Lipase: 25 U/L (ref 11.0–59.0)

## 2023-10-17 LAB — HEMOGLOBIN A1C: Hgb A1c MFr Bld: 5.9 % (ref 4.6–6.5)

## 2023-10-17 LAB — TSH: TSH: 0.65 u[IU]/mL (ref 0.35–5.50)

## 2023-10-17 NOTE — Patient Instructions (Addendum)
 Go to lab Maintain small frequent meals. Go to [address] for CXR

## 2023-10-17 NOTE — Progress Notes (Unsigned)
 Established Patient Visit  Patient: Paige Foster   DOB: [birth year]   58 y.o. Female  MRN: [id number] Visit Date: 10/19/2023  Subjective:    Chief Complaint  Patient presents with   Nasal Congestion    Nasal congestion and drainage on/off for 1 month has what feels like mucus stuck I throat at times, dry mouth and/or thirsty feeling for 3 weeks, loss of appetite-3 weeks    HPI Ms. Lucus presents with multiple complaints-postnasal drainage, nausea with food intake, increased thirst and dry mouth, early satiety and anorexia.Onset 4weeks ago. Also noted weight loss-7lbs weight loss in last 2months. She denies any BD pain, constipation, diarrhea, melena, hematochezia, palpitations, insomnia, cough, chest pain or hair loss. No improvement with claritin, azelastine and prilosec. Fhx of breast cancer, current use of arimidex, last mammogram 05/2023 Last colonoscopy: 2022  Wt Readings from Last 3 Encounters:  10/17/23 132 lb 9.6 oz (60.1 kg)  08/29/23 135 lb (61.2 kg)  08/08/23 139 lb 12.8 oz (63.4 kg)    BP Readings from Last 3 Encounters:  10/17/23 118/76  08/29/23 116/78  08/08/23 115/74    Reviewed medical, surgical, and social history today  Medications: Outpatient Medications Prior to Visit  Medication Sig   alendronate (FOSAMAX) 70 MG tablet Take 1 tablet (70 mg total) by mouth every 7 (seven) days. Take with a full glass of water on an empty stomach.   amLODipine (NORVASC) 10 MG tablet Take 1 tablet by mouth daily.   anastrozole (ARIMIDEX) 1 MG tablet TAKE 1 TABLET(1 MG) BY MOUTH DAILY   atorvastatin (LIPITOR) 20 MG tablet Take 1 tablet (20 mg total) by mouth daily.   brimonidine (ALPHAGAN) 0.15 % ophthalmic solution 1 drop 2 (two) times daily.   brimonidine (ALPHAGAN) 0.2 % ophthalmic solution SMARTSIG:In Eye(s)   dorzolamide-timolol (COSOPT) 2-0.5 % ophthalmic solution 1 drop 2 (two) times daily.   losartan (COZAAR) 50 MG tablet TAKE 1 TABLET(50 MG) BY  MOUTH DAILY   [DISCONTINUED] azelastine (ASTELIN) 0.1 % nasal spray Place 1 spray into both nostrils 2 (two) times daily. Use in each nostril as directed (Patient not taking: Reported on 10/17/2023)   [DISCONTINUED] azithromycin (ZITHROMAX Z-PAK) 250 MG tablet Take 1 tablet (250 mg total) by mouth daily. Take 2tabs on first day, then 1tab once a day till complete (Patient not taking: Reported on 10/17/2023)   [DISCONTINUED] loratadine (CLARITIN) 10 MG tablet Take 1 tablet (10 mg total) by mouth daily. (Patient not taking: Reported on 10/17/2023)   [DISCONTINUED] meloxicam (MOBIC) 7.5 MG tablet TAKE 1 TABLET(7.5 MG) BY MOUTH DAILY WITH FOOD (Patient not taking: Reported on 10/17/2023)   [DISCONTINUED] omeprazole (PRILOSEC) 20 MG capsule Take 1 capsule (20 mg total) by mouth daily. (Patient not taking: Reported on 10/17/2023)   No facility-administered medications prior to visit.   Reviewed past medical and social history.   ROS per HPI above      Objective:  BP 118/76 (BP Location: Left Arm, Patient Position: Sitting, Cuff Size: Normal)   Pulse 68   Temp 98.1 F (36.7 C) (Temporal)   Ht 5\' 4"  (1.626 m)   Wt 132 lb 9.6 oz (60.1 kg)   LMP 06/08/2007 Comment: Just stopped having period  SpO2 99%   BMI 22.76 kg/m      Physical Exam Vitals and nursing note reviewed.  Cardiovascular:     Rate and Rhythm: Normal rate and regular rhythm.  Pulses: Normal pulses.     Heart sounds: Normal heart sounds.  Pulmonary:     Effort: Pulmonary effort is normal.     Breath sounds: Normal breath sounds.  Abdominal:     General: Bowel sounds are normal. There is no distension.     Palpations: Abdomen is soft. There is no mass.     Tenderness: There is no abdominal tenderness. There is no right CVA tenderness, left CVA tenderness or guarding.  Musculoskeletal:     Cervical back: Normal range of motion and neck supple.  Lymphadenopathy:     Cervical: No cervical adenopathy.  Neurological:      Mental Status: She is alert and oriented to person, place, and time.     Results for orders placed or performed in visit on 10/17/23  Renal Function Panel  Result Value Ref Range   Sodium 136 135 - 145 mEq/L   Potassium 3.6 3.5 - 5.1 mEq/L   Chloride 103 96 - 112 mEq/L   CO2 26 19 - 32 mEq/L   Albumin 4.0 3.5 - 5.2 g/dL   BUN 10 6 - 23 mg/dL   Creatinine, Ser 8.65 0.40 - 1.20 mg/dL   Glucose, Bld 99 70 - 99 mg/dL   Phosphorus 3.1 2.3 - 4.6 mg/dL   GFR 78.46 >96.29 mL/min   Calcium 9.3 8.4 - 10.5 mg/dL  Lipase  Result Value Ref Range   Lipase 25.0 11.0 - 59.0 U/L  Hepatic function panel  Result Value Ref Range   Total Bilirubin 0.4 0.2 - 1.2 mg/dL   Bilirubin, Direct 0.1 0.0 - 0.3 mg/dL   Alkaline Phosphatase 173 (H) 39 - 117 U/L   AST 43 (H) 0 - 37 U/L   ALT 38 (H) 0 - 35 U/L   Total Protein 6.9 6.0 - 8.3 g/dL   Albumin 4.0 3.5 - 5.2 g/dL  TSH  Result Value Ref Range   TSH 0.65 0.35 - 5.50 uIU/mL  Hemoglobin A1c  Result Value Ref Range   Hgb A1c MFr Bld 5.9 4.6 - 6.5 %  HIV Antibody (routine testing w rflx)  Result Value Ref Range   HIV 1&2 Ab, 4th Generation NON-REACTIVE NON-REACTIVE  H. pylori breath test  Result Value Ref Range   H. pylori Breath Test NOT DETECTED NOT DETECTED      Assessment & Plan:    Problem List Items Addressed This Visit     Elevated LFTs   Relevant Orders   CT CHEST ABDOMEN PELVIS W CONTRAST   Mass of lower lobe of left lung   Relevant Orders   CT CHEST ABDOMEN PELVIS W CONTRAST   Other Visit Diagnoses       Weight loss, unintentional    -  Primary   Relevant Orders   Renal Function Panel (Completed)   Lipase (Completed)   Hepatic function panel (Completed)   TSH (Completed)   Hemoglobin A1c (Completed)   HIV Antibody (routine testing w rflx) (Completed)   DG Chest 2 View (Completed)   CT CHEST ABDOMEN PELVIS W CONTRAST     Post-nasal drainage         Dyspepsia       Relevant Orders   Lipase (Completed)   Hepatic function  panel (Completed)   TSH (Completed)   H. pylori breath test (Completed)     Rib lesion       Relevant Orders   CT CHEST ABDOMEN PELVIS W CONTRAST     Elevated LFTs and  abnormal CXR-LLL mass and bone lesion on 8th rib.  Return in about 4 weeks (around 11/14/2023) for weight loss and dyspepsia.     Alysia Penna, NP

## 2023-10-18 ENCOUNTER — Telehealth: Payer: Self-pay

## 2023-10-18 ENCOUNTER — Encounter: Payer: Self-pay | Admitting: Nurse Practitioner

## 2023-10-18 ENCOUNTER — Telehealth: Payer: Self-pay | Admitting: Nurse Practitioner

## 2023-10-18 DIAGNOSIS — R918 Other nonspecific abnormal finding of lung field: Secondary | ICD-10-CM | POA: Insufficient documentation

## 2023-10-18 DIAGNOSIS — R7989 Other specified abnormal findings of blood chemistry: Secondary | ICD-10-CM | POA: Insufficient documentation

## 2023-10-18 LAB — H. PYLORI BREATH TEST: H. pylori Breath Test: NOT DETECTED

## 2023-10-18 LAB — HIV ANTIBODY (ROUTINE TESTING W REFLEX): HIV 1&2 Ab, 4th Generation: NONREACTIVE

## 2023-10-18 NOTE — Telephone Encounter (Signed)
 Forwarding message below. Please advise.

## 2023-10-18 NOTE — Telephone Encounter (Signed)
 Copied from CRM [id number]. Topic: Clinical - Lab/Test Results >> Oct 18, 2023  3:26 PM Denese Killings wrote: Reason for CRM: Patient is requesting a callback from doctor in regards to xray results.  *If she can't call today please call tomorrow after 2:00pm

## 2023-10-18 NOTE — Telephone Encounter (Signed)
 LVM to CB. I need to discuss CXR results with her.

## 2023-10-19 NOTE — Telephone Encounter (Signed)
 Informed Paige Foster about lab results and CXR report. She verbalized understanding. She is scheduled for Ct chest, ABDOMEN, and pelvis on 11/09/2023.

## 2023-10-21 ENCOUNTER — Telehealth: Payer: Self-pay

## 2023-10-21 ENCOUNTER — Encounter: Payer: Self-pay | Admitting: *Deleted

## 2023-10-21 NOTE — Telephone Encounter (Signed)
 Called Pt and left brief VM regarding message below; left call back number id Pt have any other questions or concerns

## 2023-10-21 NOTE — Telephone Encounter (Signed)
 Copied from CRM [id number]. Topic: General - Other >> Oct 20, 2023  5:39 PM Eunice Blase wrote: Reason for CRM: Pt called stated needs liquid for CT scan two hour prior to appt on 11/09/2023. Please call pt at [phone].

## 2023-10-21 NOTE — Telephone Encounter (Signed)
 See other telephone encounter where provider spoke to patient about lab and x ray results.

## 2023-10-26 ENCOUNTER — Ambulatory Visit
Admission: RE | Admit: 2023-10-26 | Discharge: 2023-10-26 | Disposition: A | Discharge status: Home / Self Care | Source: Ambulatory Visit | Attending: Nurse Practitioner | Admitting: Nurse Practitioner

## 2023-10-26 ENCOUNTER — Telehealth: Payer: Self-pay | Admitting: Nurse Practitioner

## 2023-10-26 DIAGNOSIS — C787 Secondary malignant neoplasm of liver and intrahepatic bile duct: Secondary | ICD-10-CM

## 2023-10-26 DIAGNOSIS — R634 Abnormal weight loss: Secondary | ICD-10-CM

## 2023-10-26 DIAGNOSIS — R918 Other nonspecific abnormal finding of lung field: Secondary | ICD-10-CM

## 2023-10-26 DIAGNOSIS — M899 Disorder of bone, unspecified: Secondary | ICD-10-CM

## 2023-10-26 DIAGNOSIS — R7989 Other specified abnormal findings of blood chemistry: Secondary | ICD-10-CM

## 2023-10-26 DIAGNOSIS — C7951 Secondary malignant neoplasm of bone: Secondary | ICD-10-CM

## 2023-10-26 MED ORDER — IOPAMIDOL (ISOVUE-300) INJECTION 61%
100.0000 mL | Freq: Once | INTRAVENOUS | Status: AC | PRN
  Administered 2023-10-26: 100 mL via INTRAVENOUS

## 2023-10-26 NOTE — Telephone Encounter (Signed)
 Informed about CT findings and need to f/up with oncology-Dr. Al Pimple. She states she is not surprised about findings. She is concerned breast cancer treatment was not successful. She is concerned about side effects of chemotherapy and prognosis. I stated the oncologist will be able to provide additional answers to her questions. I informed her that a message was sent to Dr. Al Pimple about CT and CXR findings. She verbalized understanding

## 2023-10-26 NOTE — Telephone Encounter (Signed)
 The pt states there was something she forgot to ask her pcp when speaking with her today. She did not want to disclose the question with me but requested to receive a call back from [facility]

## 2023-10-27 ENCOUNTER — Inpatient Hospital Stay

## 2023-10-27 ENCOUNTER — Telehealth: Payer: Self-pay | Admitting: Nurse Practitioner

## 2023-10-27 ENCOUNTER — Other Ambulatory Visit: Payer: Self-pay | Admitting: *Deleted

## 2023-10-27 ENCOUNTER — Inpatient Hospital Stay: Attending: Hematology and Oncology

## 2023-10-27 DIAGNOSIS — Z79811 Long term (current) use of aromatase inhibitors: Secondary | ICD-10-CM | POA: Diagnosis not present

## 2023-10-27 DIAGNOSIS — Z170 Estrogen receptor positive status [ER+]: Secondary | ICD-10-CM | POA: Insufficient documentation

## 2023-10-27 DIAGNOSIS — C50412 Malignant neoplasm of upper-outer quadrant of left female breast: Secondary | ICD-10-CM | POA: Insufficient documentation

## 2023-10-27 DIAGNOSIS — C787 Secondary malignant neoplasm of liver and intrahepatic bile duct: Secondary | ICD-10-CM | POA: Insufficient documentation

## 2023-10-27 DIAGNOSIS — Z9013 Acquired absence of bilateral breasts and nipples: Secondary | ICD-10-CM | POA: Insufficient documentation

## 2023-10-27 DIAGNOSIS — Z95828 Presence of other vascular implants and grafts: Secondary | ICD-10-CM

## 2023-10-27 DIAGNOSIS — C7951 Secondary malignant neoplasm of bone: Secondary | ICD-10-CM | POA: Insufficient documentation

## 2023-10-27 LAB — CMP (CANCER CENTER ONLY)
ALT: 36 U/L (ref 0–44)
AST: 40 U/L (ref 15–41)
Albumin: 4.3 g/dL (ref 3.5–5.0)
Alkaline Phosphatase: 191 U/L — ABNORMAL HIGH (ref 38–126)
Anion gap: 6 (ref 5–15)
BUN: 10 mg/dL (ref 6–20)
CO2: 29 mmol/L (ref 22–32)
Calcium: 9.7 mg/dL (ref 8.9–10.3)
Chloride: 105 mmol/L (ref 98–111)
Creatinine: 0.81 mg/dL (ref 0.44–1.00)
GFR, Estimated: >60 mL/min (ref >60)
Glucose, Bld: 99 mg/dL (ref 70–99)
Potassium: 3.8 mmol/L (ref 3.5–5.1)
Sodium: 140 mmol/L (ref 135–145)
Total Bilirubin: 0.3 mg/dL (ref 0.0–1.2)
Total Protein: 7.3 g/dL (ref 6.5–8.1)

## 2023-10-27 LAB — CBC WITH DIFFERENTIAL (CANCER CENTER ONLY)
Abs Immature Granulocytes: 0.01 10*3/uL (ref 0.00–0.07)
Basophils Absolute: 0 10*3/uL (ref 0.0–0.1)
Basophils Relative: 1 %
Eosinophils Absolute: 0 10*3/uL (ref 0.0–0.5)
Eosinophils Relative: 0 %
HCT: 33.7 % — ABNORMAL LOW (ref 36.0–46.0)
Hemoglobin: 11.5 g/dL — ABNORMAL LOW (ref 12.0–15.0)
Immature Granulocytes: 0 %
Lymphocytes Relative: 27 %
Lymphs Abs: 1.2 10*3/uL (ref 0.7–4.0)
MCH: 31 pg (ref 26.0–34.0)
MCHC: 34.1 g/dL (ref 30.0–36.0)
MCV: 90.8 fL (ref 80.0–100.0)
Monocytes Absolute: 0.5 10*3/uL (ref 0.1–1.0)
Monocytes Relative: 11 %
Neutro Abs: 2.6 10*3/uL (ref 1.7–7.7)
Neutrophils Relative %: 61 %
Platelet Count: 289 10*3/uL (ref 150–400)
RBC: 3.71 MIL/uL — ABNORMAL LOW (ref 3.87–5.11)
RDW: 13.8 % (ref 11.5–15.5)
WBC Count: 4.3 10*3/uL (ref 4.0–10.5)
nRBC: 0 % (ref 0.0–0.2)

## 2023-10-27 MED ORDER — HEPARIN SOD (PORK) LOCK FLUSH 100 UNIT/ML IV SOLN: 500.0000 [IU] | Freq: Once | INTRAVENOUS | Status: DC

## 2023-10-27 MED ORDER — SODIUM CHLORIDE 0.9% FLUSH
10.0000 mL | Freq: Once | INTRAVENOUS | Status: AC
Start: 2023-10-27 — End: 2023-10-27
  Administered 2023-10-27: 10 mL

## 2023-10-27 NOTE — Telephone Encounter (Signed)
 Faxed papers to [facility] Attn: Dr. Al Pimple.   Called patient and made her aware that I fax her paper to Dr. Remonia Richter office. She asked if she can come pick up the papers from our office. I informed her that they will be at the front office/check in. She thanked me calling and stated that she will pick up her forms as soon as possible.

## 2023-10-27 NOTE — Telephone Encounter (Signed)
 Patient dropped off document Insurance Form, to be filled out by provider. Patient requested to send it back via Fax within ASAP. Document is located in providers tray at front office.Please advise at [facility] [phone]

## 2023-10-27 NOTE — Telephone Encounter (Signed)
 Returned call as requested. No answer and unable to leave voice message.

## 2023-10-27 NOTE — Telephone Encounter (Signed)
CLINICAL USE BELOW THIS LINE (use X to signify taken)  __X__Form received and placed in providers office for signature. ____Form completed and faxed to LOA Dept. ____Form completed & LVM to notify pt ready for pick up ____Charge sheet & copy of form in front office folder for office supervisor.   

## 2023-10-28 ENCOUNTER — Other Ambulatory Visit

## 2023-10-28 ENCOUNTER — Inpatient Hospital Stay (HOSPITAL_BASED_OUTPATIENT_CLINIC_OR_DEPARTMENT_OTHER): Admitting: Adult Health

## 2023-10-28 ENCOUNTER — Encounter: Payer: Self-pay | Admitting: Adult Health

## 2023-10-28 ENCOUNTER — Telehealth: Payer: Self-pay | Admitting: Hematology and Oncology

## 2023-10-28 VITALS — BP 125/75 | HR 91 | Temp 98.2°F | Resp 16 | Ht 64.0 in | Wt 126.7 lb

## 2023-10-28 DIAGNOSIS — Z170 Estrogen receptor positive status [ER+]: Secondary | ICD-10-CM

## 2023-10-28 DIAGNOSIS — Z79811 Long term (current) use of aromatase inhibitors: Secondary | ICD-10-CM | POA: Diagnosis not present

## 2023-10-28 DIAGNOSIS — C787 Secondary malignant neoplasm of liver and intrahepatic bile duct: Secondary | ICD-10-CM | POA: Diagnosis not present

## 2023-10-28 DIAGNOSIS — C50912 Malignant neoplasm of unspecified site of left female breast: Secondary | ICD-10-CM

## 2023-10-28 DIAGNOSIS — Z9013 Acquired absence of bilateral breasts and nipples: Secondary | ICD-10-CM | POA: Diagnosis not present

## 2023-10-28 DIAGNOSIS — C7951 Secondary malignant neoplasm of bone: Secondary | ICD-10-CM

## 2023-10-28 DIAGNOSIS — C50412 Malignant neoplasm of upper-outer quadrant of left female breast: Secondary | ICD-10-CM

## 2023-10-28 LAB — CANCER ANTIGEN 27.29: CA 27.29: 27.9 U/mL (ref 0.0–38.6)

## 2023-10-28 LAB — CANCER ANTIGEN 15-3: CA 15-3: 23.9 U/mL (ref 0.0–25.0)

## 2023-10-28 NOTE — Telephone Encounter (Signed)
 Spoke with pt about scheduled appt date and time.

## 2023-10-28 NOTE — Progress Notes (Unsigned)
 [facility] Cancer Follow up:    Nche, Bonna Gains, NP [address] [place] [address]   DIAGNOSIS:  Cancer Staging  Malignant neoplasm of upper-outer quadrant of left breast in female, estrogen receptor positive (HCC) Staging form: Breast, AJCC 8th Edition - Clinical stage from 08/30/2018: Stage IA (cT1c, cN0, cM0, G3, ER+, PR-, HER2+) - Signed by Loa Socks, NP on 01/31/2019 Histologic grading system: 3 grade system  Recurrent breast cancer, left (HCC) Staging form: Breast, AJCC 8th Edition - Clinical: Stage IIA (rcT2, cN0, cM0, G3, ER+, PR-, HER2+) - Unsigned Stage prefix: Recurrence Neoadjuvant therapy: No Histologic grading system: 3 grade system   SUMMARY OF ONCOLOGIC HISTORY: [place]   (1) history of left-sided ductal carcinoma in situ 2004             (a) s/p left mastectomy with TRAM reconstruction             (b) status post tamoxifen x3 years   (2) left breast upper outer quadrant biopsy 08/30/2018 shows a clinical T1c N0 invasive ductal carcinoma, grade 3, estrogen receptor strongly positive, progesterone receptor negative, with HER-2 amplification, and and MIB-1 of 15%.              (a) staging CT scan of the chest with contrast 09/14/2018 showed no evidence of metastatic disease   (3) neoadjuvant chemotherapy consisting of carboplatin, docetaxel, trastuzumab and Pertuzumab starting 09/26/2018, repeated every 21 days x 6, last dose 09/06/2019             (a) Docetaxel changed to Gemcitabine starting with cycle 4 due to lacrimal duct stenosis, and neuropathy.               (b) chemotherapy discontinued after 4 cycles because of intercurrent eye surgery             (c) continued trastuzumab and pertuzumab to complete a year (last dose 09/06/2019)             (d) echocardiogram on 01/23/2019 that shows well preserved EF of 60-65%             (e) echocardiogram on 04/23/2019 shows EF of 60-65%             (f)  echocardiogram 08/02/2019 shows an ejection fraction in the 60-65% range   (4) left lumpectomy 02/20/2019 showed a residual  ypT1c NX invasive ductal carcinoma, grade 2 with negative margins.     (5) adjuvant radiation: Radiation Treatment Dates: 04/12/2019 through 05/28/2019 Site Technique Total Dose (Gy) Dose per Fx (Gy) Completed Fx Beam Energies  Breast: CW_Lt 3D 50.4/50.4 1.8 28/28 6X, 10X  Breast: CW_Lt_SCV_PAB 3D 50.4/50.4 1.8 28/28 6X, 10X  Breast: CW_Lt_Bst Electron 10/10 2 5/5 6X, 10X    (6) anastrozole started 06/26/2019             (a) DEXA scan at [facility] 12/24/2015 found a T score of -1.6   (7) genetics testing 02/03/2019 through the Common Hereditary Cancers Panel offered by [facility] found no deleterious mutations in APC, ATM, AXIN2, BARD1, BMPR1A, BRCA1, BRCA2, BRIP1, CDH1, CDKN2A (p14ARF), CDKN2A (p16INK4a), CKD4, CHEK2, CTNNA1, DICER1, EPCAM (Deletion/duplication testing only), GREM1 (promoter region deletion/duplication testing only), KIT, MEN1, MLH1, MSH2, MSH3, MSH6, MUTYH, NBN, NF1, NHTL1, PALB2, PDGFRA, PMS2, POLD1, POLE, PTEN, RAD50, RAD51C, RAD51D, RNF43, SDHB, SDHC, SDHD, SMAD4, SMARCA4. STK11, TP53, TSC1, TSC2, and VHL.  The following genes were evaluated for sequence changes only: SDHA and HOXB13 c.251G>A variant only.  CURRENT THERAPY: Anastrozole  INTERVAL HISTORY:   Discussed the use of AI scribe software for clinical note transcription with the patient, who gave verbal consent to proceed.  Paige Foster 58 y.o. female with a history of breast cancer treated with mastectomy, chemotherapy, radiation, and anastrozole, presents with new findings on imaging suggestive of metastatic disease. She is accompanied by her husband today.    Paige Foster reports a recent history of hip pain, which improved with rehabilitation, but has recently worsened slightly. She also reports episodes of vomiting, loss of taste and smell, and significant weight loss. The vomiting has improved  recently, but the altered taste and smell persist. The patient also mentions increased thirst and drinking more water than usual. She expresses concern about the financial burden of treatment and indicates a preference for comfort and quality of life over aggressive treatment.   Patient Active Problem List   Diagnosis Date Noted   Mass of lower lobe of left lung 10/18/2023   Elevated LFTs 10/18/2023   Degeneration of intervertebral disc of lumbar region with discogenic back pain and lower extremity pain 07/26/2023   Primary osteoarthritis of right hip 07/26/2023   Pain of right hip 07/26/2023   Bulging eyes 07/04/2023   Use of anastrozole (Arimidex) 07/04/2023   Pure hypercholesterolemia 11/17/2021   Anemia 11/17/2021   Osteopenia 08/16/2019   Genetic testing 02/05/2019   Recurrent breast cancer, left (HCC) 09/07/2018   Malignant neoplasm of upper-outer quadrant of left breast in female, estrogen receptor positive (HCC) 09/06/2018   HTN (hypertension) 07/10/2013   HEMORRHOIDS-INTERNAL 04/23/2009   History of colonic polyps 04/23/2009   HEMORRHOID, THROMBOSED 03/12/2009    has no known allergies.  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    Port-A-Cath in place 09/28/2018   Recurrent breast cancer, left [facility]) oncologist-- dr Darnelle Catalan    dx 2004, noninvasive Stage 0 ----s/p left mastectomy w/ tram flap construction (and right breast reduction), taken Tamoxifen for 3 yrs;   08-30-2018 recurrent left cancer , Grade III,  cT1c,  ER positive, PR negative, HER-2 positive, invasive ductal carcinoma-- neoadjuvant chemo to start 09-26-2018   Renal artery stenosis (HCC)    mild right external renal artery stenosis per duplex in epic 08-09-2013   Wears glasses     SURGICAL HISTORY: Past Surgical History:  Procedure Laterality  Date   BREAST LUMPECTOMY WITH RADIOACTIVE SEED LOCALIZATION Left 02/20/2019   Procedure: LEFT BREAST LUMPECTOMY WITH RADIOACTIVE SEED LOCALIZATION;  Surgeon: Emelia Loron, MD;  Location: [facility] OR;  Service: General;  Laterality: Left;   BREAST SURGERY Left    Transflap   COLONOSCOPY     COLONOSCOPY     MASTECTOMY Left 2004   w/  TRAM flap construction and right breast augmentation with abdominoplasy   PARS PLANA VITRECTOMY Left 12/18/2018   Procedure: PARS PLANA VITRECTOMY WITH 25 GAUGE, ENDOLASER;  Surgeon: Carmela Rima, MD;  Location: [facility] OR;  Service: Ophthalmology;  Laterality: Left;   PORTACATH PLACEMENT N/A 09/25/2018   Procedure: INSERTION PORT-A-CATH WITH ULTRASOUND;  Surgeon: Emelia Loron, MD;  Location: [facility] ORS;  Service: General;  Laterality: N/A;   TUBAL LIGATION Bilateral yrs ago    SOCIAL HISTORY: Social History   Socioeconomic History   Marital status: Married    Spouse name: Not on file  Number of children: 2   Years of education: Not on file   Highest education level: Not on file  Occupational History   Occupation: [employer]  Tobacco Use   Smoking status: Never   Smokeless tobacco: Never  Vaping Use   Vaping status: Never Used  Substance and Sexual Activity   Alcohol use: Not Currently    Alcohol/week: 0.0 standard drinks of alcohol    Comment: Occassionally   Drug use: No   Sexual activity: Not on file  Other Topics Concern   Not on file  Social History Narrative   Not on file   Social Drivers of Health   Financial Resource Strain: Low Risk  (10/27/2022)   Overall Financial Resource Strain (CARDIA)    Difficulty of Paying Living Expenses: Not hard at all  Food Insecurity: No Food Insecurity (10/27/2022)   Hunger Vital Sign    Worried About Running Out of Food in the Last Year: Never true    Ran Out of Food in the Last Year: Never true  Transportation Needs: No Transportation Needs (10/27/2022)   PRAPARE - Administrator, Civil Service  (Medical): No    Lack of Transportation (Non-Medical): No  Physical Activity: Insufficiently Active (10/27/2022)   Exercise Vital Sign    Days of Exercise per Week: 7 days    Minutes of Exercise per Session: 20 min  Stress: No Stress Concern Present (10/27/2022)   [employer] of Occupational Health - Occupational Stress Questionnaire    Feeling of Stress : Not at all  Social Connections: Moderately Isolated (10/27/2022)   Social Connection and Isolation Panel [NHANES]    Frequency of Communication with Friends and Family: More than three times a week    Frequency of Social Gatherings with Friends and Family: More than three times a week    Attends Religious Services: Never    Database administrator or Organizations: No    Attends Banker Meetings: Never    Marital Status: Married  Catering manager Violence: Not At Risk (10/27/2022)   Humiliation, Afraid, Rape, and Kick questionnaire    Fear of Current or Ex-Partner: No    Emotionally Abused: No    Physically Abused: No    Sexually Abused: No    FAMILY HISTORY: Family History  Problem Relation Age of Onset   Diabetes Mother    Hypertension Mother    Kidney disease Father    Colon cancer Neg Hx    Colon polyps Neg Hx    Gallbladder disease Neg Hx    Heart disease Neg Hx    Esophageal cancer Neg Hx    Stomach cancer Neg Hx    Rectal cancer Neg Hx     Review of Systems  Constitutional:  Positive for fatigue. Negative for appetite change, chills, fever and unexpected weight change.  HENT:   Negative for hearing loss, lump/mass, mouth sores and trouble swallowing.   Eyes:  Negative for eye problems and icterus.  Respiratory:  Negative for chest tightness, cough and shortness of breath.   Cardiovascular:  Negative for chest pain, leg swelling and palpitations.  Gastrointestinal:  Negative for abdominal distention, abdominal pain, constipation, diarrhea, nausea and vomiting.  Endocrine: Negative for hot flashes.   Genitourinary:  Negative for difficulty urinating.   Musculoskeletal:  Negative for arthralgias.  Skin:  Negative for itching and rash.  Neurological:  Negative for dizziness, extremity weakness, headaches and numbness.  Hematological:  Negative for adenopathy. Does not bruise/bleed easily.  Psychiatric/Behavioral:  Negative for depression. The patient is not nervous/anxious.       PHYSICAL EXAMINATION    Vitals:   10/28/23 1325  BP: 125/75  Pulse: 91  Resp: 16  Temp: 98.2 F (36.8 C)  SpO2: 100%    Physical Exam Constitutional:      General: She is not in acute distress.    Appearance: Normal appearance. She is not toxic-appearing.  HENT:     Head: Normocephalic and atraumatic.     Mouth/Throat:     Mouth: Mucous membranes are moist.     Pharynx: Oropharynx is clear. No oropharyngeal exudate or posterior oropharyngeal erythema.  Eyes:     General: No scleral icterus. Cardiovascular:     Rate and Rhythm: Normal rate and regular rhythm.     Pulses: Normal pulses.     Heart sounds: Normal heart sounds.  Pulmonary:     Effort: Pulmonary effort is normal.     Breath sounds: Normal breath sounds.  Abdominal:     General: Abdomen is flat. Bowel sounds are normal. There is no distension.     Palpations: Abdomen is soft.     Tenderness: There is no abdominal tenderness.  Musculoskeletal:        General: No swelling.     Cervical back: Neck supple.  Lymphadenopathy:     Cervical: No cervical adenopathy.  Skin:    General: Skin is warm and dry.     Findings: No rash.  Neurological:     General: No focal deficit present.     Mental Status: She is alert.  Psychiatric:        Mood and Affect: Mood normal.        Behavior: Behavior normal.     LABORATORY DATA:  CBC    Component Value Date/Time   WBC 4.3 10/27/2023 1243   WBC 6.1 11/23/2021 0903   RBC 3.71 (L) 10/27/2023 1243   HGB 11.5 (L) 10/27/2023 1243   HGB 13.4 10/12/2017 0950   HGB 12.7 08/30/2007  1204   HCT 33.7 (L) 10/27/2023 1243   HCT 40.6 10/12/2017 0950   HCT 38.6 08/30/2007 1204   PLT 289 10/27/2023 1243   PLT 307 10/12/2017 0950   MCV 90.8 10/27/2023 1243   MCV 93 10/12/2017 0950   MCV 90.2 08/30/2007 1204   MCH 31.0 10/27/2023 1243   MCHC 34.1 10/27/2023 1243   RDW 13.8 10/27/2023 1243   RDW 13.4 10/12/2017 0950   RDW 12.4 08/30/2007 1204   LYMPHSABS 1.2 10/27/2023 1243   LYMPHSABS 2.2 08/30/2007 1204   MONOABS 0.5 10/27/2023 1243   MONOABS 0.4 08/30/2007 1204   EOSABS 0.0 10/27/2023 1243   EOSABS 0.1 08/30/2007 1204   BASOSABS 0.0 10/27/2023 1243   BASOSABS 0.0 08/30/2007 1204    CMP     Component Value Date/Time   NA 140 10/27/2023 1243   NA 140 10/12/2017 0950   K 3.8 10/27/2023 1243   CL 105 10/27/2023 1243   CO2 29 10/27/2023 1243   GLUCOSE 99 10/27/2023 1243   BUN 10 10/27/2023 1243   BUN 10 10/12/2017 0950   CREATININE 0.81 10/27/2023 1243   CALCIUM 9.7 10/27/2023 1243   PROT 7.3 10/27/2023 1243   PROT 7.5 10/12/2017 0950   ALBUMIN 4.3 10/27/2023 1243   ALBUMIN 4.3 10/12/2017 0950   AST 40 10/27/2023 1243   ALT 36 10/27/2023 1243   ALKPHOS 191 (H) 10/27/2023 1243   BILITOT 0.3 10/27/2023 1243  GFRNONAA >60 10/27/2023 1243   GFRAA >60 12/06/2019 1441   GFRAA >60 09/07/2018 1455        ASSESSMENT and THERAPY PLAN:   Malignant neoplasm of upper-outer quadrant of left breast in female, estrogen receptor positive (HCC) Metastatic Breast Cancer 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 Liver enzymes improved. Disease control is the goal. Recommended liver biopsy to confirm diagnosis and guide treatment. Radiation therapy considered for bone metastases. - Order liver biopsy to assess cancer characteristics. - Schedule appointment with Dr. Basilio Cairo for radiation therapy discussion.  Bone Metastases Hip pain due to bone metastases. Radiation therapy considered for pain management and fracture prevention.  Dr. Basilio Cairo to evaluate scans and discuss options. - Refer to Dr. Basilio Cairo for radiation therapy evaluation. - Manage pain with acetaminophen or ibuprofen as needed.  Weight Loss Significant weight loss from 145 lbs to 126 lbs. Variable appetite. Recommended high-calorie intake with supplements. - Refer to a nutritionist for dietary guidance. - Encourage high-calorie intake with supplements like Ensure or Boost.  Goals of Care Concerns about financial burden and quality of life. Prepared for end-of-life considerations but not at hospice stage. Focus on symptom management and quality of life. - Discuss financial concerns and complete paperwork for assistance. - Continue open communication regarding treatment goals and quality of life preferences.  Follow-up Requires further evaluation and management. - Schedule follow-up with Dr. Al Pimple after patient undergoes biopsy and to discuss results and treatment plan discussion. - Radiation oncology consultation with Dr. Basilio Cairo  All questions were answered. The patient knows to call the clinic with any problems, questions or concerns. We can certainly see the patient much sooner if necessary.  Total encounter time:45 minutes*in face-to-face visit time, chart review, lab review, care coordination, order entry, and documentation of the encounter time.    Lillard Anes, NP 10/31/23 3:59 PM Medical Oncology and Hematology [facility] [address] [place], [place] [address] Tel. [phone]    Fax. [phone]  *Total Encounter Time as defined by the Centers for Medicare and Medicaid Services includes, in addition to the face-to-face time of a patient visit (documented in the note above) non-face-to-face time: obtaining and reviewing outside history, ordering and reviewing medications, tests or procedures, care coordination (communications with other health care professionals or caregivers) and documentation in the medical record.

## 2023-10-31 ENCOUNTER — Telehealth: Payer: Self-pay | Admitting: Radiation Oncology

## 2023-10-31 ENCOUNTER — Encounter: Payer: Self-pay | Admitting: Adult Health

## 2023-10-31 DIAGNOSIS — C7951 Secondary malignant neoplasm of bone: Secondary | ICD-10-CM | POA: Insufficient documentation

## 2023-10-31 DIAGNOSIS — C787 Secondary malignant neoplasm of liver and intrahepatic bile duct: Secondary | ICD-10-CM | POA: Insufficient documentation

## 2023-10-31 NOTE — Telephone Encounter (Signed)
Left message for patient to call back to schedule consult per 4/4 referral. 

## 2023-10-31 NOTE — Assessment & Plan Note (Signed)
 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 Liver enzymes improved. Disease control is the goal. Recommended liver biopsy to confirm diagnosis and guide treatment. Radiation therapy considered for bone metastases. - Order liver biopsy to assess cancer characteristics. - Schedule appointment with Dr. Basilio Cairo for radiation therapy discussion.  Bone Metastases Hip pain due to bone metastases. Radiation therapy considered for pain management and fracture prevention. Dr. Basilio Cairo to evaluate scans and discuss options. - Refer to Dr. Basilio Cairo for radiation therapy evaluation. - Manage pain with acetaminophen or ibuprofen as needed.  Weight Loss Significant weight loss from 145 lbs to 126 lbs. Variable appetite. Recommended high-calorie intake with supplements. - Refer to a nutritionist for dietary guidance. - Encourage high-calorie intake with supplements like Ensure or Boost.  Goals of Care Concerns about financial burden and quality of life. Prepared for end-of-life considerations but not at hospice stage. Focus on symptom management and quality of life. - Discuss financial concerns and complete paperwork for assistance. - Continue open communication regarding treatment goals and quality of life preferences.  Follow-up Requires further evaluation and management. - Schedule follow-up with Dr. Al Pimple after patient undergoes biopsy and to discuss results and treatment plan discussion. - Radiation oncology consultation with Dr. Basilio Cairo

## 2023-10-31 NOTE — Progress Notes (Signed)
 Paige Lack, MD  Paige Foster PROCEDURE / BIOPSY REVIEW Date: 10/28/23  Requested Biopsy site: Liver Reason for request: masses Imaging review: Best seen on CT  Decision: Approved Imaging modality to perform: Ultrasound Schedule with: Moderate Sedation Schedule for: Any VIR  Additional comments:   Please contact me with questions, concerns, or if issue pertaining to this request arise.  Reola Calkins, MD [facility]       Previous Messages    ----- Message ----- From: Paige Foster Sent: 10/28/2023   3:05 PM EDT To: Paige Foster; Ir Procedure Requests Subject: US liver biopsy                                Procedure : US liver biopsy  Reason: metastatic breast cancer, new on CT, biopsy Dx: Malignant neoplasm of upper-outer quadrant of left breast in female, estrogen receptor positive (HCC) [C50.412, Z17.0 (ICD-10-CM)]; Recurrent breast cancer, left (HCC) [V03.500 (ICD-10-CM)]  Ordering Comments  Please add caris/foundation 1 testing    History : CT Chest abd pelv w/  Provider : Loa Socks, NP  Provider contact :  [phone]

## 2023-11-01 ENCOUNTER — Telehealth: Payer: Self-pay | Admitting: Radiation Oncology

## 2023-11-01 NOTE — Telephone Encounter (Signed)
Left message for patient to call back to schedule consult per 4/4 referral. 

## 2023-11-02 NOTE — Progress Notes (Incomplete)
 Histology and Location of Primary Cancer:  Malignant Neoplasm of Upper-Outer Quadrant of Left Breast, Estrogen Receptor Positive. Stage IV Breast Cancer with Metastasis to Liver, Bones and Left Chest Wall.  Location(s) of Symptomatic tumor(s):  Hip pain due to bone metastases.  Receptor Status: ER(Positive), PR (Negative), Her2-neu (Amplification)  Did patient present with symptoms (if so, please note symptoms) or was this found on screening mammography?:  10/26/2023 CT Chest Abdomen and Pelvis with Contrast   Past/Anticipated interventions by surgeon, if any: 2004 Dr. Shon Hough Left Mastectomy with TRAM Reconstruction   02/20/2019 Dr. Dwain Sarna Left Breast Lumpectomy with Radioactive Seed Localization   Past/Anticipated interventions by medical oncology, if any:  (6) anastrozole started 06/26/2019             (a) DEXA scan at [facility] 12/24/2015 found a T score of -1.6 DEXA scan from mass June 2023 showed T score of -1.4 at L1-L2    Pain on a scale of 0-10 is: 5 out of 10 intermittent pain on right hip.     If Spine Met(s), symptoms, if any, include: Bowel/Bladder retention or incontinence (please describe): None. Patient drinks a lot of water Numbness or weakness in extremities (please describe): None Current Decadron regimen, if applicable: None  Ambulatory status? Walker? Wheelchair? None   Lymphedema issues, if any:  None    SAFETY ISSUES: Prior radiation? Yes Pacemaker/ICD? None Possible current pregnancy?N/A Is the patient on methotrexate? None  Current Complaints / other details:   Patient says her taste has changed and is not as hungry as she used to be and is starting to loose weight. Eats 1-2 times a day Encouraged to drink protein drinks and eat high calorie foods.  Wt Readings from Last 3 Encounters:  10/28/23 126 lb 11.2 oz (57.5 kg)  10/17/23 132 lb 9.6 oz (60.1 kg)  08/29/23 135 lb (61.2 kg)

## 2023-11-03 NOTE — Progress Notes (Incomplete)
 Radiation Oncology         [phone] ________________________________  Outpatient Re-Consultation  Name: Paige Foster MRN: [id number]  Date: 11/04/2023  DOB: June 17, 1966  NW:GNFA, Bonna Gains, NP  Rachel Moulds, MD   REFERRING PHYSICIAN: Rachel Moulds, MD  DIAGNOSIS: No diagnosis found.   Cancer Staging  Malignant neoplasm of upper-outer quadrant of left breast in female, estrogen receptor positive (HCC) Staging form: Breast, AJCC 8th Edition - Clinical stage from 08/30/2018: Stage IA (cT1c, cN0, cM0, G3, ER+, PR-, HER2+) - Signed by Loa Socks, NP on 01/31/2019 Histologic grading system: 3 grade system  Recurrent breast cancer, left (HCC) Staging form: Breast, AJCC 8th Edition - Clinical: Stage IIA (rcT2, cN0, cM0, G3, ER+, PR-, HER2+) - Unsigned Stage prefix: Recurrence Neoadjuvant therapy: No Histologic grading system: 3 grade system  Recent imaging with evidence of extensive hepatic, osseous, and pulmonary metastatic disease likely from a breast cancer primary pending biopsies   History of invasive ductal carcinoma of the left breast diagnosed in 2020, ER+ / PR- / Her2+ / Grade 3: s/p neoadjuvant chemotherapy, left breast lumpectomy, adjuvant radiation therapy, and anastrozole x 5 years (due to complete anastrozole this coming Nov 2025)   Initial diagnosis of left breast DCIS in 2004: s/p left mastectomy w/ TRAM reconstruction followed by Tamoxifen x3 years   CHIEF COMPLAINT: Here to discuss management of osseous metastatic disease from a breast cancer primary  HISTORY OF PRESENT ILLNESS::Paige Foster is a 58 y.o. female who is known to me for his history of recurrent left breast cancer diagnosed in 2020, s/p neoadjuvant chemotherapy, followed by a left breast lumpectomy and adjuvant radiation therapy completed in November of 2020. She has been on anastrozole since that time due to be completed this coming November) and has continued to follow with Dr.  Al Pimple.  Prior to her present diagnosis of metastatic breast cancer, she had a routine bilateral screening mammogram on 06/17/23 that showed no evidence of malignancy in either breast.   This past December 2024, the patient developed acute onset right hip pain and lower back pain. She was evaluated by her PCP for this on 07/18/23 and had an x-ray of the pelvis performed that day which showed mild degenerative findings but no acute findings to account for her hip pain. An x-ray of the lumbar spine was also obtained on 07/26/23 which again showed no acute findings and mild degenerative changes.   Approximately a month later, she also presented to the ED on 08/03/23 with acute onset right shoulder pain with radiation down into her right arm. An x-ray of the right shoulder was obtained and showed concurrent findings to her hip and lower back x-rays (I.e. mild degenerative changes and no acute findings). Physical exam findings were also unremarkable and she was advised to take NSAIDs for pain management at discharge.   This past March (2025), the patient then presented to her PCP with several complaints, most notably consisting of unintentional weight loss (7 lbs over the course of 2 months), dyspnea, and nausea. Based on her cancer history and reported symptoms, a chest x-ray was obtained on 10/17/23 which demonstrated an apparent pleural based mass in the left lower lung field as well as evidence of a possible metastatic posterior right 8th rib lesion.   A CT CAP with contrast was accordingly performed on 10/26/23 which unfortunately confirmed evidence of metastatic disease, characterized by: a necrotic appearing 3.8 x 5.5 x 5 cm left anterior chest wall mass along the  mid axillary line with rib involvement and destruction; an enlarged liver with multiple metastatic enhancing lesions throughout the bilateral lobes, with the liver overall appearing to be completely replaced by metastatic disease; and lytic  metastatic bone lesions involving the right anterior superior iliac crest, left iliac bone, right inferior pubic ramus, left iliac bone, and L4 vertebral body. (The metastatic lesions involving the right anterior-superior iliac crest and left iliac bone are the most significant, measuring 3.4 x 2.4 cm and 3.5 x 2.4 cm, respectfully). Other notable findings include a separate lytic bone lesion involving the left iliac bone at the level of left sacroiliac joint measuring 1.8 cm, and possibly lytic bone changes involving the upper thoracic vertebral bodies at the level T1-T2. CT findings otherwise show no evidence of metastatic lymphadenopathy in the chest, abdomen, or pelvis.   She recently followed up with Dr. Al Pimple on 10/28/23 to discuss these findings. For her osseous metastatic disease, Dr. Al Pimple has recommended radiation therapy for pain control and fracture prevention which we will discuss in detail today. With regards to her other sites of metastatic disease, Dr. Al Pimple has recommended proceeding with liver biopsies to confirm her diagnosis and to guide treatment planning.   Pertaining to further systemic treatment, the patient has expressed concerns to Dr. Al Pimple surrounding her quality of life and the potential financial burden associated with treatment. Dr. Al Pimple has discussed financial management with the patient and has advised her to complete paperwork for financial assistance. The patient has also expressed to Dr. Al Pimple that she is prepared for end-of-life measures and that her primary focus is symptom management and preserving her quality of life. She is scheduled to undergo liver biopsies on 11/14/23 and will follow up with Dr. Al Pimple to discuss these results and to further discuss her treatment plan/goals.    PREVIOUS RADIATION THERAPY: Yes   Radiation Treatment Dates: 04/12/2019 through 05/28/2019 Site Technique Total Dose (Gy) Dose per Fx (Gy) Completed Fx Beam Energies  Breast: CW_Lt 3D  50.4/50.4 1.8 28/28 6X, 10X  Breast: CW_Lt_SCV_PAB 3D 50.4/50.4 1.8 28/28 6X, 10X  Breast: CW_Lt_Bst Electron 10/10 2 5/5 6X, 10X    PREVIOUS SYSTEMIC THERAPY:   Neoadjuvant chemotherapy consisting of carboplatin, docetaxel, trastuzumab and Pertuzumab starting 09/26/2018, repeated every 21 days x 6, last dose 09/06/2019   PAST MEDICAL HISTORY:  has a past medical history of Anemia, History of blood transfusion (2004), History of colon polyps, Hypertension, Neuromuscular disorder (HCC), Port-A-Cath in place (09/28/2018), Recurrent breast cancer, left [facility]) (oncologist-- dr Darnelle Catalan ), Renal artery stenosis (HCC), and Wears glasses.    PAST SURGICAL HISTORY: Past Surgical History:  Procedure Laterality Date   BREAST LUMPECTOMY WITH RADIOACTIVE SEED LOCALIZATION Left 02/20/2019   Procedure: LEFT BREAST LUMPECTOMY WITH RADIOACTIVE SEED LOCALIZATION;  Surgeon: Emelia Loron, MD;  Location: [facility] OR;  Service: General;  Laterality: Left;   BREAST SURGERY Left    Transflap   COLONOSCOPY     COLONOSCOPY     MASTECTOMY Left 2004   w/  TRAM flap construction and right breast augmentation with abdominoplasy   PARS PLANA VITRECTOMY Left 12/18/2018   Procedure: PARS PLANA VITRECTOMY WITH 25 GAUGE, ENDOLASER;  Surgeon: Carmela Rima, MD;  Location: [facility] OR;  Service: Ophthalmology;  Laterality: Left;   PORTACATH PLACEMENT N/A 09/25/2018   Procedure: INSERTION PORT-A-CATH WITH ULTRASOUND;  Surgeon: Emelia Loron, MD;  Location: [facility] ORS;  Service: General;  Laterality: N/A;   TUBAL LIGATION Bilateral yrs ago    FAMILY HISTORY: family history includes Diabetes  in her mother; Hypertension in her mother; Kidney disease in her father.  SOCIAL HISTORY:  reports that she has never smoked. She has never used smokeless tobacco. She reports that she does not currently use alcohol. She reports that she does not use drugs.  ALLERGIES: Patient has no known allergies.  MEDICATIONS:  Current Outpatient  Medications  Medication Sig Dispense Refill   alendronate (FOSAMAX) 70 MG tablet Take 1 tablet (70 mg total) by mouth every 7 (seven) days. Take with a full glass of water on an empty stomach. 12 tablet 3   amLODipine (NORVASC) 10 MG tablet Take 1 tablet by mouth daily. 90 tablet 2   anastrozole (ARIMIDEX) 1 MG tablet TAKE 1 TABLET(1 MG) BY MOUTH DAILY 90 tablet 4   atorvastatin (LIPITOR) 20 MG tablet Take 1 tablet (20 mg total) by mouth daily. 90 tablet 3   brimonidine (ALPHAGAN) 0.15 % ophthalmic solution 1 drop 2 (two) times daily.     brimonidine (ALPHAGAN) 0.2 % ophthalmic solution SMARTSIG:In Eye(s)     dorzolamide-timolol (COSOPT) 2-0.5 % ophthalmic solution 1 drop 2 (two) times daily.     losartan (COZAAR) 50 MG tablet TAKE 1 TABLET(50 MG) BY MOUTH DAILY 90 tablet 3   No current facility-administered medications for this encounter.    REVIEW OF SYSTEMS:  Notable for that above.   PHYSICAL EXAM:  vitals were not taken for this visit.   General: Alert and oriented, in no acute distress *** HEENT: Head is normocephalic. Extraocular movements are intact. Oropharynx is clear. Neck: Neck is supple, no palpable cervical or supraclavicular lymphadenopathy. Heart: Regular in rate and rhythm with no murmurs, rubs, or gallops. Chest: Clear to auscultation bilaterally, with no rhonchi, wheezes, or rales. Abdomen: Soft, nontender, nondistended, with no rigidity or guarding. Extremities: No cyanosis or edema. Lymphatics: see Neck Exam Skin: No concerning lesions. Musculoskeletal: symmetric strength and muscle tone throughout. Neurologic: Cranial nerves II through XII are grossly intact. No obvious focalities. Speech is fluent. Coordination is intact. Psychiatric: Judgment and insight are intact. Affect is appropriate.   ECOG = ***  0 - Asymptomatic (Fully active, able to carry on all predisease activities without restriction)  1 - Symptomatic but completely ambulatory (Restricted in  physically strenuous activity but ambulatory and able to carry out work of a light or sedentary nature. For example, light housework, office work)  2 - Symptomatic, <50% in bed during the day (Ambulatory and capable of all self care but unable to carry out any work activities. Up and about more than 50% of waking hours)  3 - Symptomatic, >50% in bed, but not bedbound (Capable of only limited self-care, confined to bed or chair 50% or more of waking hours)  4 - Bedbound (Completely disabled. Cannot carry on any self-care. Totally confined to bed or chair)  5 - Death   Santiago Glad MM, Creech RH, Tormey DC, et al. [id number]). "Toxicity and response criteria of the [facility]". Am. Evlyn Clines. Oncol. 5 (6): 649-55   LABORATORY DATA:  Lab Results  Component Value Date   WBC 4.3 10/27/2023   HGB 11.5 (L) 10/27/2023   HCT 33.7 (L) 10/27/2023   MCV 90.8 10/27/2023   PLT 289 10/27/2023   CMP     Component Value Date/Time   NA 140 10/27/2023 1243   NA 140 10/12/2017 0950   K 3.8 10/27/2023 1243   CL 105 10/27/2023 1243   CO2 29 10/27/2023 1243   GLUCOSE 99 10/27/2023 1243  BUN 10 10/27/2023 1243   BUN 10 10/12/2017 0950   CREATININE 0.81 10/27/2023 1243   CALCIUM 9.7 10/27/2023 1243   PROT 7.3 10/27/2023 1243   PROT 7.5 10/12/2017 0950   ALBUMIN 4.3 10/27/2023 1243   ALBUMIN 4.3 10/12/2017 0950   AST 40 10/27/2023 1243   ALT 36 10/27/2023 1243   ALKPHOS 191 (H) 10/27/2023 1243   BILITOT 0.3 10/27/2023 1243   GFR 87.30 10/17/2023 1107   GFRNONAA >60 10/27/2023 1243         RADIOGRAPHY: CT CHEST ABDOMEN PELVIS W CONTRAST Result Date: 10/26/2023 CLINICAL DATA:  Metastatic disease evaluation. Left lower lobe mass history of left breast cancer EXAM: CT CHEST, ABDOMEN, AND PELVIS WITH CONTRAST TECHNIQUE: Multidetector CT imaging of the chest, abdomen and pelvis was performed following the standard protocol during bolus administration of intravenous contrast. RADIATION DOSE  REDUCTION: This exam was performed according to the departmental dose-optimization program which includes automated exposure control, adjustment of the mA and/or kV according to patient size and/or use of iterative reconstruction technique. CONTRAST:  ISOVUE-300 IOPAMIDOL (ISOVUE-300) INJECTION 61% COMPARISON:  Chest x-ray Dec 17, 2023, CT chest September 14, 2018, CT abdomen October 15, 2015 FINDINGS: CT CHEST FINDINGS Cardiovascular: No significant vascular findings. Normal heart size. No pericardial effusion. No significant coronary artery calcifications Mediastinum/Nodes: No enlarged mediastinal, hilar, or axillary lymph nodes. Thyroid gland, trachea, and esophagus demonstrate no significant findings. Lungs/Pleura: Lungs are clear. No pleural effusion or pneumothorax. No pulmonary nodules 3.8 by 5.5 by 5 cm left anterior chest wall mass along the mid axillary line with rib involvement and destruction and low-attenuation changes of the center indicating necrotic mass correlates with a neoplastic lesion likely metastatic disease. Musculoskeletal: Rib lesion as described above CT ABDOMEN PELVIS FINDINGS Hepatobiliary: Liver is enlarged, inhomogeneous with multiple enhancing lesions throughout the right and left lobe of the liver consistent with extensive numerous metastatic liver lesions. Liver appears completely replaced by metastatic disease. Gallbladder not clearly identified without biliary dilatation. Pancreas: Unremarkable. No pancreatic ductal dilatation or surrounding inflammatory changes. Spleen: Small spleen without lesions Adrenals/Urinary Tract: Adrenal glands are unremarkable. Kidneys are normal, without renal calculi, focal lesion, or hydronephrosis. Bladder is unremarkable. Stomach/Bowel: Stomach is within normal limits. Appendix appears normal. No evidence of bowel wall thickening, distention, or inflammatory changes. Vascular/Lymphatic: Aortic atherosclerosis. No enlarged abdominal or pelvic  lymph nodes. Reproductive: Uterus and bilateral adnexa are unremarkable. Other: No abdominal wall hernia or abnormality. No abdominopelvic ascites. Musculoskeletal: Lytic metastatic bone destructive lesion involving the right anterior superior iliac crest measuring 3.4 x 2.4 cm. Similar lesion on the left iliac bone measuring 3.5 x 2.4 cm. Healed fracture of the right proximal pubic ramus right inferior pubic ramus demonstrates a lytic destructive bone lesion measuring 2.2 x 1.7 cm 1.8 x 1 cm lytic bone lesion involving the left iliac bone of the level of the left sacroiliac joint Suggestive of lytic bone changes involving the L4 vertebral body as well as the upper thoracic vertebral bodies T1-T2. IMPRESSION: *3.8 x 5.5 x 5 cm left anterior chest wall mass along the mid axillary line with rib involvement and destruction and low-attenuation changes of the center indicating necrotic mass correlates with a neoplastic lesion likely metastatic disease. *Liver is enlarged, inhomogeneous with multiple enhancing lesions throughout the right and left lobe of the liver consistent with extensive numerous metastatic liver lesions. Liver appears completely replaced by metastatic disease. *Lytic metastatic bone lesions involving the right anterior superior iliac crest, left iliac bone,  right inferior pubic ramus, left iliac bone, and L4 vertebral body. *Suggestive of lytic bone changes involving the upper thoracic vertebral bodies T1-T2. *Aortic atherosclerosis. Electronically Signed   By: Shaaron Adler M.D.   On: 10/26/2023 15:14   DG Chest 2 View Result Date: 10/18/2023 CLINICAL DATA:  Unintentional weight loss. EXAM: CHEST - 2 VIEW COMPARISON:  Chest CT dated 09/14/2018. FINDINGS: There is an apparent pleural based mass in the left lower lung field seen on the PA view. Further evaluation with chest CT is recommended. The right lung is clear. No pleural effusion or pneumothorax. The cardiac silhouette is within normal  limits. Focal expansile lucency of the posterior right eighth rib concerning for a metastatic disease or pathologic fracture. IMPRESSION: Findings concerning for a left lung base pleural base mass and posterior right eighth rib metastatic lesion. Further evaluation with CT is recommended. Electronically Signed   By: Elgie Collard M.D.   On: 10/18/2023 13:18      IMPRESSION/PLAN:***    On date of service, in total, I spent *** minutes on this encounter. Patient was seen in person.   __________________________________________   Lonie Peak, MD  This document serves as a record of services personally performed by Lonie Peak, MD. It was created on her behalf by Neena Rhymes, a trained medical scribe. The creation of this record is based on the scribe's personal observations and the provider's statements to them. This document has been checked and approved by the attending provider.

## 2023-11-04 ENCOUNTER — Telehealth: Payer: Self-pay | Admitting: *Deleted

## 2023-11-04 ENCOUNTER — Ambulatory Visit
Admission: RE | Admit: 2023-11-04 | Discharge: 2023-11-04 | Disposition: A | Discharge status: Home / Self Care | Source: Ambulatory Visit | Attending: Radiation Oncology | Admitting: Radiation Oncology

## 2023-11-04 DIAGNOSIS — Z170 Estrogen receptor positive status [ER+]: Secondary | ICD-10-CM | POA: Diagnosis not present

## 2023-11-04 DIAGNOSIS — C7951 Secondary malignant neoplasm of bone: Secondary | ICD-10-CM | POA: Diagnosis not present

## 2023-11-04 DIAGNOSIS — C50412 Malignant neoplasm of upper-outer quadrant of left female breast: Secondary | ICD-10-CM | POA: Diagnosis not present

## 2023-11-04 NOTE — Telephone Encounter (Signed)
 Called patient to inform of MRIS for 11-11-23- arrival time- 2:30 pm @ [facility], no restrictions to scan, spoke with patient and she is aware of these MRIS and the instructions

## 2023-11-07 ENCOUNTER — Other Ambulatory Visit: Payer: Self-pay

## 2023-11-07 ENCOUNTER — Telehealth: Payer: Self-pay

## 2023-11-07 NOTE — Telephone Encounter (Signed)
 Patient called regarding the status of her critical illness paperwork. Patient aware that the forms department received her paperwork on 4/9 and it currently taking 10-14 business days for paperwork to be processed.  Patient verbalized an understanding of the information. All questions answered during call.

## 2023-11-09 ENCOUNTER — Telehealth: Payer: Self-pay

## 2023-11-09 ENCOUNTER — Other Ambulatory Visit

## 2023-11-09 NOTE — Progress Notes (Incomplete)
 Histology and Location of Primary Cancer:  Malignant Neoplasm of Upper-Outer Quadrant of Left Breast, Estrogen Receptor Positive. Stage IV Breast Cancer with Metastasis to Liver, Bones and Left Chest Wall.   Location(s) of Symptomatic tumor(s):  Hip pain due to bone metastases.   Receptor Status: ER(Positive), PR (Negative), Her2-neu (Amplification)   Did patient present with symptoms (if so, please note symptoms) or was this found on screening mammography?:  11/04/2023 Dr. Lurena Sally In January, patient presented to the emergency room with acute onset right hip and lower back pain. Patient presented with acute onset of right shoulder pain with radiation down into her right arm.  Patient was having unintentional weight loss, dyspnea and nausea 10/17/23 Chest Xray    10/26/2023 CT Chest Abdomen and Pelvis with Contrast    Past/Anticipated interventions by surgeon, if any: 2004 Dr. Lindle Rhea Left Mastectomy with TRAM Reconstruction    02/20/2019 Dr. Delane Fear Left Breast Lumpectomy with Radioactive Seed Localization  Past/ Anticipated Interventions by Medical Oncology: 11/04/2023 Lurena Sally, MD Dr. Arno Bibles recommends radiation therapy for pain control and fracture prevention. With regard to sites of metastatic disease, Dr. Arno Bibles has recommended proceeding with liver biopsies to confirm her diagnosis and to guide treatment planning.   Lymphedema issues, if any:  {:18581} {t:21944}   Pain issues, if any:  {:18581} {PAIN DESCRIPTION:21022940}  SAFETY ISSUES: Prior radiation? {:18581} Pacemaker/ICD? {:18581} Possible current pregnancy?{:18581} Is the patient on methotrexate? {:18581}  Current Complaints / other details:  ***

## 2023-11-09 NOTE — Telephone Encounter (Signed)
 Left message on patient voicemail about appointment on 4/17

## 2023-11-10 ENCOUNTER — Other Ambulatory Visit: Payer: Self-pay | Admitting: Interventional Radiology

## 2023-11-10 ENCOUNTER — Inpatient Hospital Stay (HOSPITAL_BASED_OUTPATIENT_CLINIC_OR_DEPARTMENT_OTHER): Admitting: Hematology and Oncology

## 2023-11-10 VITALS — BP 109/64 | HR 62 | Temp 98.0°F | Resp 17 | Wt 128.7 lb

## 2023-11-10 DIAGNOSIS — C7951 Secondary malignant neoplasm of bone: Secondary | ICD-10-CM | POA: Diagnosis not present

## 2023-11-10 DIAGNOSIS — Z9013 Acquired absence of bilateral breasts and nipples: Secondary | ICD-10-CM | POA: Diagnosis not present

## 2023-11-10 DIAGNOSIS — C50412 Malignant neoplasm of upper-outer quadrant of left female breast: Secondary | ICD-10-CM | POA: Diagnosis not present

## 2023-11-10 DIAGNOSIS — Z01818 Encounter for other preprocedural examination: Secondary | ICD-10-CM

## 2023-11-10 DIAGNOSIS — C787 Secondary malignant neoplasm of liver and intrahepatic bile duct: Secondary | ICD-10-CM | POA: Diagnosis not present

## 2023-11-10 DIAGNOSIS — Z79811 Long term (current) use of aromatase inhibitors: Secondary | ICD-10-CM | POA: Diagnosis not present

## 2023-11-10 DIAGNOSIS — Z170 Estrogen receptor positive status [ER+]: Secondary | ICD-10-CM

## 2023-11-10 NOTE — Progress Notes (Signed)
 [facility]  [phone] [phone]    ID: Paige Foster DOB: April 12, 1966  MR#: [id number]  JYN#:[id number]  Patient Care Team: Paige Organ, NP as PCP - General (Internal Medicine) Paige Harry, MD as Consulting Physician (General Surgery) Paige Foster, Paige Delaine, NP as Nurse Practitioner (Internal Medicine) Paige Minder, MD as Consulting Physician (Ophthalmology) Paige Eisenmenger, MD as Consulting Physician (Cardiology) Paige Friendly Cordelia Dessert, MD as Consulting Physician (Gastroenterology) Paige Foster) OTHER MD:    CHIEF COMPLAINT: 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  History of Present Illness      Rest of the pertinent 10 point ROS reviewed and neg.  HISTORY OF CURRENT ILLNESS: From the original intake note:  Paige Foster has a prior history of left breast cancer, dating back to 2004. At that time she underwent a left mastectomy for stage 0 (noninvasive) breast cancer, with transverse rectus abdominis (TRAM) flap construction under Dr. Lindle Foster. She also underwent a right breast reduction. She took tamoxifen for three years.  More recently she underwent bilateral diagnostic mammography with tomography and left breast ultrasonography at [facility] on 01/03/2018 showing: Breast Density Category B. There is an oval fat containing lesion in the left breast upper outer quadrant posterior depth. No other significant masses, calcifications, or other findings are seen in either breast. Sonographically, there is a 1.5 cm lesion in the left breast upper outer quadrant posterior depth. This lesion is of mixed echogenicity. This correlates as palpated and with mammography findings. Follow up was recommended.  Close follow-up was  suggested.  She then presented with a non-tender mass in the left reconstructed breast on 08/30/2018. On physical exam, there is a hard palpable lump measuring 2.0 cm in the upper outer left reconstructed breast 10 cm from the expected location of a nipple. Sonography over this area demonstrates a 1.8 cm x 1.7 cm x 1.4 cm mass in the left breast at 2 o'clock posterior depth 10 cm from the nipple. This mass is of mixed echogenicity. This abnormality is increased in size and correlates as palpated and with prior mammography findings. Color flow imaging demonstrates that there is vascularity present. Elastography imaging assessment is intermediate. No significant abnormalities were seen sonographically in the left axilla.    Accordingly on 08/30/2018 she proceeded to biopsy of the left breast mass in question. The pathology from this procedure showed ([id number]): invasive ductal carcinoma, grade III. Prognostic indicators significant for: estrogen receptor, 100% positive with strong staining intensity and progesterone receptor, 0% negative. Proliferation marker Ki67 at 15%. HER2 positive (3+) by immunohistochemistry.  The patient's subsequent history is as detailed below.   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    Port-A-Cath in place 09/28/2018   Recurrent breast cancer, left [facility]) oncologist-- dr Paige Foster    dx 2004, noninvasive Stage 0 ----s/p left mastectomy w/ tram flap construction (and right breast reduction), taken Tamoxifen for 3 yrs;   08-30-2018 recurrent left cancer , Grade III,  cT1c,  ER positive, PR negative, HER-2 positive, invasive ductal carcinoma-- neoadjuvant chemo to start 09-26-2018   Renal artery  stenosis (HCC)    mild right external renal artery stenosis per duplex in epic 08-09-2013   Wears glasses      PAST SURGICAL HISTORY: Past Surgical History:  Procedure Laterality Date   BREAST LUMPECTOMY WITH RADIOACTIVE SEED LOCALIZATION Left 02/20/2019   Procedure: LEFT BREAST LUMPECTOMY WITH RADIOACTIVE SEED LOCALIZATION;  Surgeon: Paige Loron, MD;  Location: [facility] OR;  Service: General;  Laterality: Left;   BREAST SURGERY Left    Transflap   COLONOSCOPY     COLONOSCOPY     MASTECTOMY Left 2004   w/  TRAM flap construction and right breast augmentation with abdominoplasy   PARS PLANA VITRECTOMY Left 12/18/2018   Procedure: PARS PLANA VITRECTOMY WITH 25 GAUGE, ENDOLASER;  Surgeon: Paige Rima, MD;  Location: [facility] OR;  Service: Ophthalmology;  Laterality: Left;   PORTACATH PLACEMENT N/A 09/25/2018   Procedure: INSERTION PORT-A-CATH WITH ULTRASOUND;  Surgeon: Paige Loron, MD;  Location: [facility] ORS;  Service: General;  Laterality: N/A;   TUBAL LIGATION Bilateral yrs ago    FAMILY HISTORY: Family History  Problem Relation Age of Onset   Diabetes Mother    Hypertension Mother    Kidney disease Father    Colon cancer Neg Hx    Colon polyps Neg Hx    Gallbladder disease Neg Hx    Heart disease Neg Hx    Esophageal cancer Neg Hx    Stomach cancer Neg Hx    Rectal cancer Neg Hx   Paige Foster's father died from unknown causes in his early 29's. Patients' mother died from diabetes complications at age 28. The patient has 1 sister. Patient denies anyone in her family having breast, ovarian, prostate, or pancreatic cancer.    GYNECOLOGIC HISTORY:  Patient's last menstrual period was 06/08/2007. 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 Her husband, Paige Foster, works at [employer]. Paige Foster has two children, Paige Foster and Paige Foster. Paige Foster lives with her, is 23, and it attending [employer] for a computer based degree.  Paige Foster lives with her, is 3, and recently graduated from [employer] with a degree in Pension scheme manager.  He works for [employer]. Marcheta has no grandchildren. She attends the [employer].   ADVANCED DIRECTIVES: In the absence of any documents to the contrary her husband, Paige Foster, is automatically her healthcare power of attorney     HEALTH MAINTENANCE: Social History   Tobacco Use   Smoking status: Never   Smokeless tobacco: Never  Vaping Use   Vaping status: Never Used  Substance Use Topics   Alcohol use: Not Currently    Alcohol/week: 0.0 standard drinks of alcohol    Comment: Occassionally   Drug use: No    Colonoscopy: April 2022, danis  PAP: January 2022, Paige Foster  Bone density:  2017; -1.6, osteopenic   No Known Allergies  Current Outpatient Medications  Medication Sig Dispense Refill   alendronate (FOSAMAX) 70 MG tablet Take 1 tablet (70 mg total) by mouth every 7 (seven) days. Take with a full glass of water on an empty stomach. 12 tablet 3   amLODipine (NORVASC) 10 MG tablet Take 1 tablet by mouth daily. 90 tablet 2   anastrozole (ARIMIDEX) 1 MG tablet TAKE 1 TABLET(1 MG) BY MOUTH DAILY 90 tablet 4   atorvastatin (LIPITOR) 20 MG tablet Take 1 tablet (20 mg total)  by mouth daily. 90 tablet 3   brimonidine (ALPHAGAN) 0.15 % ophthalmic solution 1 drop 2 (two) times daily.     brimonidine (ALPHAGAN) 0.2 % ophthalmic solution SMARTSIG:In Eye(s)     dorzolamide-timolol (COSOPT) 2-0.5 % ophthalmic solution 1 drop 2 (two) times daily.     losartan (COZAAR) 50 MG tablet TAKE 1 TABLET(50 MG) BY MOUTH DAILY 90 tablet 3   No current facility-administered medications for this visit.     OBJECTIVE: African-American woman who appears stated age  Vitals:   11/10/23 0841  BP: 109/64  Pulse: 62  Resp: 17  Temp: 98 F (36.7 C)  SpO2: 99%       Body mass index is 22.09 kg/m.   Wt Readings from Last 3 Encounters:  11/10/23 128 lb 11.2 oz (58.4 kg)  10/28/23 126 lb 11.2 oz (57.5 kg)   10/17/23 132 lb 9.6 oz (60.1 kg)   NECK: No lymphadenopathy noted. CHEST: Lungs clear to auscultation. CARDIOVASCULAR: Heart sounds normal. ABDOMEN: Abdomen non-tender on palpation.   LAB RESULTS:  CMP     Component Value Date/Time   NA 140 10/27/2023 1243   NA 140 10/12/2017 0950   K 3.8 10/27/2023 1243   CL 105 10/27/2023 1243   CO2 29 10/27/2023 1243   GLUCOSE 99 10/27/2023 1243   BUN 10 10/27/2023 1243   BUN 10 10/12/2017 0950   CREATININE 0.81 10/27/2023 1243   CALCIUM 9.7 10/27/2023 1243   PROT 7.3 10/27/2023 1243   PROT 7.5 10/12/2017 0950   ALBUMIN 4.3 10/27/2023 1243   ALBUMIN 4.3 10/12/2017 0950   AST 40 10/27/2023 1243   ALT 36 10/27/2023 1243   ALKPHOS 191 (H) 10/27/2023 1243   BILITOT 0.3 10/27/2023 1243   GFRNONAA >60 10/27/2023 1243   GFRAA >60 12/06/2019 1441   GFRAA >60 09/07/2018 1455   Lab Results  Component Value Date   WBC 4.3 10/27/2023   NEUTROABS 2.6 10/27/2023   HGB 11.5 (L) 10/27/2023   HCT 33.7 (L) 10/27/2023   MCV 90.8 10/27/2023   PLT 289 10/27/2023    Lab Results  Component Value Date   LABCA2 <4 08/30/2007    No components found for: "NWGNFA213"  No results for input(s): "INR" in the last 168 hours.  Lab Results  Component Value Date   LABCA2 <4 08/30/2007    No results found for: "YQM578"  No results found for: "CAN125"  Lab Results  Component Value Date   CAN153 23.9 10/27/2023    Lab Results  Component Value Date   CA2729 27.9 10/27/2023    No components found for: "HGQUANT"  No results found for: "CEA1", "CEA" / No results found for: "CEA1", "CEA"  No results found for: "AFPTUMOR"  No results found for: "CHROMOGRNA"  No results found for: "TOTALPROTELP", "ALBUMINELP", "A1GS", "A2GS", "BETS", "BETA2SER", "GAMS", "MSPIKE", "SPEI" (this displays SPEP labs)  No results found for: "KPAFRELGTCHN", "LAMBDASER", "KAPLAMBRATIO" (kappa/lambda light chains)  No results found for: "HGBA", "HGBA2QUANT",  "HGBFQUANT", "HGBSQUAN" (Hemoglobinopathy evaluation)   Lab Results  Component Value Date   LDH 169 08/30/2007    Lab Results  Component Value Date   IRON 96 07/11/2023   TIBC 382 07/11/2023   IRONPCTSAT 25 07/11/2023   (Iron and TIBC)  Lab Results  Component Value Date   FERRITIN 261 07/11/2023    Urinalysis    Component Value Date/Time   LABSPEC 1.015 04/09/2008 1548   PHURINE 7.0 04/09/2008 1548   HGBUR large 04/09/2008 1548  BILIRUBINUR negative 04/09/2008 1548   UROBILINOGEN 0.2 04/09/2008 1548   NITRITE negative 04/09/2008 1548    STUDIES:  CT CHEST ABDOMEN PELVIS W CONTRAST Result Date: 10/26/2023 CLINICAL DATA:  Metastatic disease evaluation. Left lower lobe mass history of left breast cancer EXAM: CT CHEST, ABDOMEN, AND PELVIS WITH CONTRAST TECHNIQUE: Multidetector CT imaging of the chest, abdomen and pelvis was performed following the standard protocol during bolus administration of intravenous contrast. RADIATION DOSE REDUCTION: This exam was performed according to the departmental dose-optimization program which includes automated exposure control, adjustment of the mA and/or kV according to patient size and/or use of iterative reconstruction technique. CONTRAST:  ISOVUE-300 IOPAMIDOL (ISOVUE-300) INJECTION 61% COMPARISON:  Chest x-ray Dec 17, 2023, CT chest September 14, 2018, CT abdomen October 15, 2015 FINDINGS: CT CHEST FINDINGS Cardiovascular: No significant vascular findings. Normal heart size. No pericardial effusion. No significant coronary artery calcifications Mediastinum/Nodes: No enlarged mediastinal, hilar, or axillary lymph nodes. Thyroid gland, trachea, and esophagus demonstrate no significant findings. Lungs/Pleura: Lungs are clear. No pleural effusion or pneumothorax. No pulmonary nodules 3.8 by 5.5 by 5 cm left anterior chest wall mass along the mid axillary line with rib involvement and destruction and low-attenuation changes of the center  indicating necrotic mass correlates with a neoplastic lesion likely metastatic disease. Musculoskeletal: Rib lesion as described above CT ABDOMEN PELVIS FINDINGS Hepatobiliary: Liver is enlarged, inhomogeneous with multiple enhancing lesions throughout the right and left lobe of the liver consistent with extensive numerous metastatic liver lesions. Liver appears completely replaced by metastatic disease. Gallbladder not clearly identified without biliary dilatation. Pancreas: Unremarkable. No pancreatic ductal dilatation or surrounding inflammatory changes. Spleen: Small spleen without lesions Adrenals/Urinary Tract: Adrenal glands are unremarkable. Kidneys are normal, without renal calculi, focal lesion, or hydronephrosis. Bladder is unremarkable. Stomach/Bowel: Stomach is within normal limits. Appendix appears normal. No evidence of bowel wall thickening, distention, or inflammatory changes. Vascular/Lymphatic: Aortic atherosclerosis. No enlarged abdominal or pelvic lymph nodes. Reproductive: Uterus and bilateral adnexa are unremarkable. Other: No abdominal wall hernia or abnormality. No abdominopelvic ascites. Musculoskeletal: Lytic metastatic bone destructive lesion involving the right anterior superior iliac crest measuring 3.4 x 2.4 cm. Similar lesion on the left iliac bone measuring 3.5 x 2.4 cm. Healed fracture of the right proximal pubic ramus right inferior pubic ramus demonstrates a lytic destructive bone lesion measuring 2.2 x 1.7 cm 1.8 x 1 cm lytic bone lesion involving the left iliac bone of the level of the left sacroiliac joint Suggestive of lytic bone changes involving the L4 vertebral body as well as the upper thoracic vertebral bodies T1-T2. IMPRESSION: *3.8 x 5.5 x 5 cm left anterior chest wall mass along the mid axillary line with rib involvement and destruction and low-attenuation changes of the center indicating necrotic mass correlates with a neoplastic lesion likely metastatic disease.  *Liver is enlarged, inhomogeneous with multiple enhancing lesions throughout the right and left lobe of the liver consistent with extensive numerous metastatic liver lesions. Liver appears completely replaced by metastatic disease. 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 *Suggestive of lytic bone changes involving the upper thoracic vertebral bodies T1-T2. *Aortic atherosclerosis. Electronically Signed   By: Fredrich Jefferson M.D.   On: 10/26/2023 15:14   DG Chest 2 View Result Date: 10/18/2023 CLINICAL DATA:  Unintentional weight loss. EXAM: CHEST - 2 VIEW COMPARISON:  Chest CT dated 09/14/2018. FINDINGS: There is an apparent pleural  based mass in the left lower lung field seen on the PA view. Further evaluation with chest CT is recommended. The right lung is clear. No pleural effusion or pneumothorax. The cardiac silhouette is within normal limits. Focal expansile lucency of the posterior right eighth rib concerning for a metastatic disease or pathologic fracture. IMPRESSION: Findings concerning for a left lung base pleural base mass and posterior right eighth rib metastatic lesion. Further evaluation with CT is recommended. Electronically Signed   By: Elgie Collard M.D.   On: 10/18/2023 13:18     ELIGIBLE FOR AVAILABLE RESEARCH PROTOCOL: No  ASSESSMENT: 58 y.o. [place], [place] woman  (1) history of left-sided ductal carcinoma in situ 2004  (a) s/p left mastectomy with TRAM reconstruction  (b) status post tamoxifen x3 years  (2) left breast upper outer quadrant biopsy 08/30/2018 shows a clinical T1c N0 invasive ductal carcinoma, grade 3, estrogen receptor strongly positive, progesterone receptor negative, with HER-2 amplification, and and MIB-1 of 15%.   (a) staging CT scan of the chest with contrast 09/14/2018 showed no evidence of metastatic disease  (3) neoadjuvant  chemotherapy consisting of carboplatin, docetaxel, trastuzumab and Pertuzumab starting 09/26/2018, repeated every 21 days x 6, last dose 09/06/2019  (a) Docetaxel changed to Gemcitabine starting with cycle 4 due to lacrimal duct stenosis, and neuropathy.    (b) chemotherapy discontinued after 4 cycles because of intercurrent eye surgery  (c) continued trastuzumab and pertuzumab to complete a year (last dose 09/06/2019)  (d) echocardiogram on 01/23/2019 that shows well preserved EF of 60-65%  (e) echocardiogram on 04/23/2019 shows EF of 60-65%  (f) echocardiogram 08/02/2019 shows an ejection fraction in the 60-65% range  (4) left lumpectomy 02/20/2019 showed a residual  ypT1c NX invasive ductal carcinoma, grade 2 with negative margins.    (5) adjuvant radiation: Radiation Treatment Dates: 04/12/2019 through 05/28/2019 Site Technique Total Dose (Gy) Dose per Fx (Gy) Completed Fx Beam Energies  Breast: CW_Lt 3D 50.4/50.4 1.8 28/28 6X, 10X  Breast: CW_Lt_SCV_PAB 3D 50.4/50.4 1.8 28/28 6X, 10X  Breast: CW_Lt_Bst Electron 10/10 2 5/5 6X, 10X   (6) anastrozole started 06/26/2019  (a) [facility] 12/24/2015 found a T score of -1.6 DEXA scan from mass June 2023 showed T score of -1.4 at L1-L2  (7) genetics testing 02/03/2019 through the Common Hereditary Cancers Panel offered by [facility] found no deleterious mutations in APC, ATM, AXIN2, BARD1, BMPR1A, BRCA1, BRCA2, BRIP1, CDH1, CDKN2A (p14ARF), CDKN2A (p16INK4a), CKD4, CHEK2, CTNNA1, DICER1, EPCAM (Deletion/duplication testing only), GREM1 (promoter region deletion/duplication testing only), KIT, MEN1, MLH1, MSH2, MSH3, MSH6, MUTYH, NBN, NF1, NHTL1, PALB2, PDGFRA, PMS2, POLD1, POLE, PTEN, RAD50, RAD51C, RAD51D, RNF43, SDHB, SDHC, SDHD, SMAD4, SMARCA4. STK11, TP53, TSC1, TSC2, and VHL.  The following genes were evaluated for sequence changes only: SDHA and HOXB13 c.251G>A variant only.   PLAN:  Assessment and Plan Assessment &  Plan       Rachel Moulds, MD  Medical Oncology and Hematology [facility] [address] [place], [place] [address] Tel. [phone]    Fax. [phone]  Total time spent, 20 minutes  *Total Encounter Time as defined by the Centers for Medicare and Medicaid Services includes, in addition to the face-to-face time of a patient visit (documented in the note above) non-face-to-face time: obtaining and reviewing outside history, ordering and reviewing medications, tests or procedures, care coordination (communications with other health care professionals or caregivers) and documentation in the medical record.

## 2023-11-10 NOTE — Progress Notes (Signed)
 [facility]  [phone] [phone]    ID: Paige Foster DOB: December 26, 1965  MR#: [id number]  JYN#:[id number]  Patient Care Team: Anne Ng, NP as PCP - General (Internal Medicine) Emelia Loron, MD as Consulting Physician (General Surgery) Nche, Bonna Gains, NP as Nurse Practitioner (Internal Medicine) Carmela Rima, MD as Consulting Physician (Ophthalmology) Laurey Morale, MD as Consulting Physician (Cardiology) Myrtie Neither Andreas Blower, MD as Consulting Physician (Gastroenterology) Shea Evans Genice [facility]) OTHER MD:    CHIEF COMPLAINT: 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  History of Present Illness Paige Foster is a 58 year old female with metastatic breast cancer who presents for follow up.  She describes recent changes in her sense of taste and smell, beginning around late February or early March 2025. Initially, she thought these changes might be related to COVID-19 or environmental factors in her home. She communicated these symptoms to her primary doctor.  She recalls a history of pain in her chest bone, previously investigated with x-rays in January 2023, revealing a fracture. After that she had multiple appointments with [place] and she absolutely denied any other pain or symptoms. Even today she has no symptoms. She is most worried about the financial implication of met breast cancer.  She is currently taking anastrozole but is concerned about its effectiveness. She mentions experiencing a metallic taste in her mouth and variable appetite, with episodes of nausea and vomiting that have since improved.   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 Her work requires her to be on her feet a lot, but she  does not lift heavy objects. She is concerned about her ability to continue working after upcoming medical procedures.  Rest of the pertinent 10 point ROS reviewed and neg.  HISTORY OF CURRENT ILLNESS: From the original intake note:  Paige Foster has a prior history of left breast cancer, dating back to 2004. At that time she underwent a left mastectomy for stage 0 (noninvasive) breast cancer, with transverse rectus abdominis (TRAM) flap construction under Dr. Shon Hough. She also underwent a right breast reduction. She took tamoxifen for three years.  More recently she underwent bilateral diagnostic mammography with tomography and left breast ultrasonography at [facility] on 01/03/2018 showing: Breast Density Category B. There is an oval fat containing lesion in the left breast upper outer quadrant posterior depth. No other significant masses, calcifications, or other findings are seen in either breast. Sonographically, there is a 1.5 cm lesion in the left breast upper outer quadrant posterior depth. This lesion is of mixed echogenicity. This correlates as palpated and with mammography findings. Follow up was recommended.  Close follow-up was suggested.  She then presented with a non-tender mass in the left reconstructed breast on 08/30/2018. On physical exam, there is a hard palpable lump measuring 2.0 cm in the upper outer left reconstructed breast 10 cm from the expected location of a nipple. Sonography over this area demonstrates a 1.8 cm x 1.7 cm x 1.4 cm mass in the left breast at 2 o'clock posterior depth 10 cm from the nipple. This mass is of mixed echogenicity. This abnormality is increased in size and correlates as palpated and with prior mammography findings. Color flow imaging demonstrates that there is vascularity present. Elastography imaging assessment is  intermediate. No significant abnormalities were seen sonographically in the left axilla.    Accordingly on 08/30/2018 she proceeded to  biopsy of the left breast mass in question. The pathology from this procedure showed ([id number]): invasive ductal carcinoma, grade III. Prognostic indicators significant for: estrogen receptor, 100% positive with strong staining intensity and progesterone receptor, 0% negative. Proliferation marker Ki67 at 15%. HER2 positive (3+) by immunohistochemistry.  The patient's subsequent history is as detailed below.   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    Port-A-Cath in place 09/28/2018   Recurrent breast cancer, left [facility]) oncologist-- dr Charolett Copes    dx 2004, noninvasive Stage 0 ----s/p left mastectomy w/ tram flap construction (and right breast reduction), taken Tamoxifen for 3 yrs;   08-30-2018 recurrent left cancer , Grade III,  cT1c,  ER positive, PR negative, HER-2 positive, invasive ductal carcinoma-- neoadjuvant chemo to start 09-26-2018   Renal artery stenosis (HCC)    mild right external renal artery stenosis per duplex in epic 08-09-2013   Wears glasses     PAST SURGICAL HISTORY: Past Surgical History:  Procedure Laterality Date   BREAST LUMPECTOMY WITH RADIOACTIVE SEED LOCALIZATION Left 02/20/2019   Procedure: LEFT BREAST LUMPECTOMY WITH RADIOACTIVE SEED LOCALIZATION;  Surgeon: Enid Harry, MD;  Location: [facility] OR;  Service: General;  Laterality: Left;   BREAST SURGERY Left    Transflap   COLONOSCOPY     COLONOSCOPY     MASTECTOMY Left 2004   w/  TRAM flap construction and right breast augmentation with abdominoplasy   PARS PLANA VITRECTOMY Left 12/18/2018   Procedure: PARS PLANA VITRECTOMY WITH 25 GAUGE, ENDOLASER;  Surgeon: Jearline Minder, MD;  Location: [facility] OR;  Service: Ophthalmology;  Laterality: Left;   PORTACATH PLACEMENT N/A 09/25/2018   Procedure: INSERTION PORT-A-CATH WITH ULTRASOUND;  Surgeon:  Enid Harry, MD;  Location: [facility] ORS;  Service: General;  Laterality: N/A;   TUBAL LIGATION Bilateral yrs ago    FAMILY HISTORY: Family History  Problem Relation Age of Onset   Diabetes Mother    Hypertension Mother    Kidney disease Father    Colon cancer Neg Hx    Colon polyps Neg Hx    Gallbladder disease Neg Hx    Heart disease Neg Hx    Esophageal cancer Neg Hx    Stomach cancer Neg Hx    Rectal cancer Neg Hx   Eldean's father died from unknown causes in his early 31's. Patients' mother died from diabetes complications at age 16. The patient has 1 sister. Patient denies anyone in her family having breast, ovarian, prostate, or pancreatic cancer.    GYNECOLOGIC HISTORY:  Patient's last menstrual period was 06/08/2007. 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 Her husband, Marlou Sims, works at [employer]. Bleu has two children, Melodie Spry and Myrtie Atkinson. Melodie Spry lives with her, is 34, and it attending [employer] for a computer based degree. Myrtie Atkinson lives with her, is 51, and recently graduated from [employer] with a degree in Pension scheme manager.  He works for [employer]. Aaria has no grandchildren. She attends the [address] of [address]   ADVANCED  DIRECTIVES: In the absence of any documents to the contrary her husband, Marlou Sims, is automatically her healthcare power of attorney     HEALTH MAINTENANCE: Social History   Tobacco Use   Smoking status: Never   Smokeless tobacco: Never  Vaping Use   Vaping status: Never Used  Substance Use Topics   Alcohol use: Not Currently    Alcohol/week: 0.0 standard drinks of alcohol    Comment: Occassionally   Drug use: No    Colonoscopy: April 2022, danis  PAP: January 2022, Nche  Bone density:  2017; -1.6, osteopenic   No Known Allergies  Current Outpatient Medications   Medication Sig Dispense Refill   alendronate (FOSAMAX) 70 MG tablet Take 1 tablet (70 mg total) by mouth every 7 (seven) days. Take with a full glass of water on an empty stomach. 12 tablet 3   amLODipine (NORVASC) 10 MG tablet Take 1 tablet by mouth daily. 90 tablet 2   anastrozole (ARIMIDEX) 1 MG tablet TAKE 1 TABLET(1 MG) BY MOUTH DAILY 90 tablet 4   atorvastatin (LIPITOR) 20 MG tablet Take 1 tablet (20 mg total) by mouth daily. 90 tablet 3   brimonidine (ALPHAGAN) 0.15 % ophthalmic solution 1 drop 2 (two) times daily.     brimonidine (ALPHAGAN) 0.2 % ophthalmic solution SMARTSIG:In Eye(s)     dorzolamide-timolol (COSOPT) 2-0.5 % ophthalmic solution 1 drop 2 (two) times daily.     losartan (COZAAR) 50 MG tablet TAKE 1 TABLET(50 MG) BY MOUTH DAILY 90 tablet 3   No current facility-administered medications for this visit.     OBJECTIVE: African-American woman who appears stated age  Vitals:   11/10/23 0841  BP: 109/64  Pulse: 62  Resp: 17  Temp: 98 F (36.7 C)  SpO2: 99%       Body mass index is 22.09 kg/m.   Wt Readings from Last 3 Encounters:  11/10/23 128 lb 11.2 oz (58.4 kg)  10/28/23 126 lb 11.2 oz (57.5 kg)  10/17/23 132 lb 9.6 oz (60.1 kg)   NECK: No lymphadenopathy noted. CHEST: Lungs clear to auscultation. CARDIOVASCULAR: Heart sounds normal. ABDOMEN: Abdomen non-tender on palpation.   LAB RESULTS:  CMP     Component Value Date/Time   NA 140 10/27/2023 1243   NA 140 10/12/2017 0950   K 3.8 10/27/2023 1243   CL 105 10/27/2023 1243   CO2 29 10/27/2023 1243   GLUCOSE 99 10/27/2023 1243   BUN 10 10/27/2023 1243   BUN 10 10/12/2017 0950   CREATININE 0.81 10/27/2023 1243   CALCIUM 9.7 10/27/2023 1243   PROT 7.3 10/27/2023 1243   PROT 7.5 10/12/2017 0950   ALBUMIN 4.3 10/27/2023 1243   ALBUMIN 4.3 10/12/2017 0950   AST 40 10/27/2023 1243   ALT 36 10/27/2023 1243   ALKPHOS 191 (H) 10/27/2023 1243   BILITOT 0.3 10/27/2023 1243   GFRNONAA >60 10/27/2023  1243   GFRAA >60 12/06/2019 1441   GFRAA >60 09/07/2018 1455   Lab Results  Component Value Date   WBC 4.3 10/27/2023   NEUTROABS 2.6 10/27/2023   HGB 11.5 (L) 10/27/2023   HCT 33.7 (L) 10/27/2023   MCV 90.8 10/27/2023   PLT 289 10/27/2023    Lab Results  Component Value Date   LABCA2 <4 08/30/2007    No components found for: "[id number]"  No results for input(s): "INR" in the last 168 hours.  Lab Results  Component Value Date   LABCA2 <4 08/30/2007    No results found  for: "CAN199"  No results found for: "CAN125"  Lab Results  Component Value Date   CAN153 23.9 10/27/2023    Lab Results  Component Value Date   CA2729 27.9 10/27/2023    No components found for: "HGQUANT"  No results found for: "CEA1", "CEA" / No results found for: "CEA1", "CEA"  No results found for: "AFPTUMOR"  No results found for: "CHROMOGRNA"  No results found for: "TOTALPROTELP", "ALBUMINELP", "A1GS", "A2GS", "BETS", "BETA2SER", "GAMS", "MSPIKE", "SPEI" (this displays SPEP labs)  No results found for: "KPAFRELGTCHN", "LAMBDASER", "KAPLAMBRATIO" (kappa/lambda light chains)  No results found for: "HGBA", "HGBA2QUANT", "HGBFQUANT", "HGBSQUAN" (Hemoglobinopathy evaluation)   Lab Results  Component Value Date   LDH 169 08/30/2007    Lab Results  Component Value Date   IRON 96 07/11/2023   TIBC 382 07/11/2023   IRONPCTSAT 25 07/11/2023   (Iron and TIBC)  Lab Results  Component Value Date   FERRITIN 261 07/11/2023    Urinalysis    Component Value Date/Time   LABSPEC 1.015 04/09/2008 1548   PHURINE 7.0 04/09/2008 1548   HGBUR large 04/09/2008 1548   BILIRUBINUR negative 04/09/2008 1548   UROBILINOGEN 0.2 04/09/2008 1548   NITRITE negative 04/09/2008 1548    STUDIES:  CT CHEST ABDOMEN PELVIS W CONTRAST Result Date: 10/26/2023 CLINICAL DATA:  Metastatic disease evaluation. Left lower lobe mass history of left breast cancer EXAM: CT CHEST, ABDOMEN, AND PELVIS WITH  CONTRAST TECHNIQUE: Multidetector CT imaging of the chest, abdomen and pelvis was performed following the standard protocol during bolus administration of intravenous contrast. RADIATION DOSE REDUCTION: This exam was performed according to the departmental dose-optimization program which includes automated exposure control, adjustment of the mA and/or kV according to patient size and/or use of iterative reconstruction technique. CONTRAST:  ISOVUE-300 IOPAMIDOL (ISOVUE-300) INJECTION 61% COMPARISON:  Chest x-ray Dec 17, 2023, CT chest September 14, 2018, CT abdomen October 15, 2015 FINDINGS: CT CHEST FINDINGS Cardiovascular: No significant vascular findings. Normal heart size. No pericardial effusion. No significant coronary artery calcifications Mediastinum/Nodes: No enlarged mediastinal, hilar, or axillary lymph nodes. Thyroid gland, trachea, and esophagus demonstrate no significant findings. Lungs/Pleura: Lungs are clear. No pleural effusion or pneumothorax. No pulmonary nodules 3.8 by 5.5 by 5 cm left anterior chest wall mass along the mid axillary line with rib involvement and destruction and low-attenuation changes of the center indicating necrotic mass correlates with a neoplastic lesion likely metastatic disease. Musculoskeletal: Rib lesion as described above CT ABDOMEN PELVIS FINDINGS Hepatobiliary: Liver is enlarged, inhomogeneous with multiple enhancing lesions throughout the right and left lobe of the liver consistent with extensive numerous metastatic liver lesions. Liver appears completely replaced by metastatic disease. Gallbladder not clearly identified without biliary dilatation. Pancreas: Unremarkable. No pancreatic ductal dilatation or surrounding inflammatory changes. Spleen: Small spleen without lesions Adrenals/Urinary Tract: Adrenal glands are unremarkable. Kidneys are normal, without renal calculi, focal lesion, or hydronephrosis. Bladder is unremarkable. Stomach/Bowel: Stomach is within  normal limits. Appendix appears normal. No evidence of bowel wall thickening, distention, or inflammatory changes. Vascular/Lymphatic: Aortic atherosclerosis. No enlarged abdominal or pelvic lymph nodes. Reproductive: Uterus and bilateral adnexa are unremarkable. Other: No abdominal wall hernia or abnormality. No abdominopelvic ascites. Musculoskeletal: Lytic metastatic bone destructive lesion involving the right anterior superior iliac crest measuring 3.4 x 2.4 cm. Similar lesion on the left iliac bone measuring 3.5 x 2.4 cm. Healed fracture of the right proximal pubic ramus right inferior pubic ramus demonstrates a lytic destructive bone lesion measuring 2.2 x 1.7 cm 1.8 x  1 cm lytic bone lesion involving the left iliac bone of the level of the left sacroiliac joint Suggestive of lytic bone changes involving the L4 vertebral body as well as the upper thoracic vertebral bodies T1-T2. IMPRESSION: *3.8 x 5.5 x 5 cm left anterior chest wall mass along the mid axillary line with rib involvement and destruction and low-attenuation changes of the center indicating necrotic mass correlates with a neoplastic lesion likely metastatic disease. *Liver is enlarged, inhomogeneous with multiple enhancing lesions throughout the right and left lobe of the liver consistent with extensive numerous metastatic liver lesions. Liver appears completely replaced by metastatic disease. 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 *Suggestive of lytic bone changes involving the upper thoracic vertebral bodies T1-T2. *Aortic atherosclerosis. Electronically Signed   By: Shaaron Adler M.D.   On: 10/26/2023 15:14   DG Chest 2 View Result Date: 10/18/2023 CLINICAL DATA:  Unintentional weight loss. EXAM: CHEST - 2 VIEW COMPARISON:  Chest CT dated 09/14/2018. FINDINGS: There is an apparent pleural based mass in the left  lower lung field seen on the PA view. Further evaluation with chest CT is recommended. The right lung is clear. No pleural effusion or pneumothorax. The cardiac silhouette is within normal limits. Focal expansile lucency of the posterior right eighth rib concerning for a metastatic disease or pathologic fracture. IMPRESSION: Findings concerning for a left lung base pleural base mass and posterior right eighth rib metastatic lesion. Further evaluation with CT is recommended. Electronically Signed   By: Elgie Collard M.D.   On: 10/18/2023 13:18     ELIGIBLE FOR AVAILABLE RESEARCH PROTOCOL: No  ASSESSMENT: 58 y.o. [place], [place] woman  (1) history of left-sided ductal carcinoma in situ 2004  (a) s/p left mastectomy with TRAM reconstruction  (b) status post tamoxifen x3 years  (2) left breast upper outer quadrant biopsy 08/30/2018 shows a clinical T1c N0 invasive ductal carcinoma, grade 3, estrogen receptor strongly positive, progesterone receptor negative, with HER-2 amplification, and and MIB-1 of 15%.   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    (b) chemotherapy discontinued after 4 cycles because of intercurrent eye surgery  (c) continued trastuzumab and pertuzumab to complete a year (last dose 09/06/2019)  (d) echocardiogram on 01/23/2019 that shows well preserved EF of 60-65%  (e) echocardiogram on 04/23/2019 shows EF of 60-65%  (f) echocardiogram 08/02/2019 shows an ejection fraction in the 60-65% range  (4) left lumpectomy 02/20/2019 showed a residual  ypT1c NX invasive ductal carcinoma, grade 2 with negative margins.    (5) adjuvant radiation: Radiation  Treatment Dates: 04/12/2019 through 05/28/2019 Site Technique Total Dose (Gy) Dose per Fx (Gy) Completed Fx Beam Energies  Breast: CW_Lt 3D 50.4/50.4 1.8 28/28 6X, 10X  Breast: CW_Lt_SCV_PAB 3D 50.4/50.4 1.8 28/28 6X, 10X  Breast: CW_Lt_Bst Electron 10/10 2 5/5 6X, 10X   (6) anastrozole started 06/26/2019  (a) D[facility] at [facility] 12/24/2015 found a T score of -1.6 DEXA scan from mass June 2023 showed T score of -1.4 at L1-L2  (7) genetics testing 02/03/2019 through the Common Hereditary Cancers Panel offered by [facility] found no deleterious mutations  in APC, ATM, AXIN2, BARD1, BMPR1A, BRCA1, BRCA2, BRIP1, CDH1, CDKN2A (p14ARF), CDKN2A (p16INK4a), CKD4, CHEK2, CTNNA1, DICER1, EPCAM (Deletion/duplication testing only), GREM1 (promoter region deletion/duplication testing only), KIT, MEN1, MLH1, MSH2, MSH3, MSH6, MUTYH, NBN, NF1, NHTL1, PALB2, PDGFRA, PMS2, POLD1, POLE, PTEN, RAD50, RAD51C, RAD51D, RNF43, SDHB, SDHC, SDHD, SMAD4, SMARCA4. STK11, TP53, TSC1, TSC2, and VHL.  The following genes were evaluated for sequence changes only: SDHA and HOXB13 c.251G>A variant only.  (8) She had a CXR for unintentional weight loss,  this showed possible left lung pleural based mass and posterior right eighth rib met lesion. CT showed 3.8 x 5.5 x 5 cm left anterior chest wall mass along the mid axillary line with rib involvement and destruction and low-attenuation changes of the center indicating necrotic mass correlates with a neoplastic lesion likely metastatic disease. Liver is enlarged, inhomogeneous with multiple enhancing lesions throughout the right and left lobe of the liver consistent with extensive numerous metastatic liver lesions. Liver appears completely replaced by metastatic disease. Lytic metastatic bone lesions involving the right anterior superior iliac crest, left iliac bone, right inferior pubic ramus, left iliac bone, and L4 vertebral body. Suggestive of lytic bone changes involving the upper thoracic  vertebral bodies T1-T2.  PLAN:  Assessment and Plan Assessment & Plan Metastatic Breast Cancer Metastatic breast cancer with multiorgan involvement. Anastrozole ineffective; plan to transition to Faslodex while awaiting biopsy results. Radiation therapy considered for pain and rib protection. Goal: life extension and quality of life improvement. - Schedule biopsy to determine pathology and prognostics. - Initiate Faslodex , stop anastrozole. - Follow up with Dr. Lurena Sally next week. - Discuss radiation therapy with Dr. Lurena Sally for pain management and palliation.  Goals of Care She is fatigued and concerned about treatment costs. Cancer is incurable; treatment may extend life and improve quality of life. Prefers non-aggressive treatments to avoid medical debt. - Discuss treatment options and potential outcomes post-biopsy. - Consider palliative care if she opts against aggressive treatment.  Time spent: 40 min   Murleen Arms, MD  Medical Oncology and Hematology [facility] [address] [place], [place] [address] Tel. [phone]    Fax. [phone]  *Total Encounter Time as defined by the Centers for Medicare and Medicaid Services includes, in addition to the face-to-face time of a patient visit (documented in the note above) non-face-to-face time: obtaining and reviewing outside history, ordering and reviewing medications, tests or procedures, care coordination (communications with other health care professionals or caregivers) and documentation in the medical record.

## 2023-11-11 ENCOUNTER — Other Ambulatory Visit: Payer: Self-pay | Admitting: Student

## 2023-11-11 ENCOUNTER — Ambulatory Visit (HOSPITAL_COMMUNITY)
Admission: RE | Admit: 2023-11-11 | Discharge: 2023-11-11 | Disposition: A | Discharge status: Home / Self Care | Source: Ambulatory Visit | Attending: Radiation Oncology | Admitting: Radiation Oncology

## 2023-11-11 DIAGNOSIS — C801 Malignant (primary) neoplasm, unspecified: Secondary | ICD-10-CM | POA: Diagnosis not present

## 2023-11-11 DIAGNOSIS — Z170 Estrogen receptor positive status [ER+]: Secondary | ICD-10-CM | POA: Diagnosis not present

## 2023-11-11 DIAGNOSIS — C7951 Secondary malignant neoplasm of bone: Secondary | ICD-10-CM

## 2023-11-11 DIAGNOSIS — C50412 Malignant neoplasm of upper-outer quadrant of left female breast: Secondary | ICD-10-CM

## 2023-11-11 DIAGNOSIS — M7581 Other shoulder lesions, right shoulder: Secondary | ICD-10-CM | POA: Diagnosis not present

## 2023-11-11 DIAGNOSIS — M19011 Primary osteoarthritis, right shoulder: Secondary | ICD-10-CM | POA: Diagnosis not present

## 2023-11-11 DIAGNOSIS — M8440XA Pathological fracture, unspecified site, initial encounter for fracture: Secondary | ICD-10-CM | POA: Diagnosis not present

## 2023-11-11 DIAGNOSIS — C787 Secondary malignant neoplasm of liver and intrahepatic bile duct: Secondary | ICD-10-CM | POA: Diagnosis not present

## 2023-11-11 MED ORDER — GADOBUTROL 1 MMOL/ML IV SOLN
5.0000 mL | Freq: Once | INTRAVENOUS | Status: AC | PRN
  Administered 2023-11-11: 5 mL via INTRAVENOUS

## 2023-11-13 NOTE — H&P (Signed)
 Chief Complaint: Liver Mass. Team is requesting a liver mass biopsy for further evaluation of treatment options. Caris/foundation 1 testing   Referring Physician(s): Causey,Lindsey Cornetto  Supervising Physician: Marland Silvas  Patient Status: [facility] - Out-pt  History of Present Illness: Paige Foster is a 58 y.o. female outpatient. History of HTN, anemia, renal artery stenosis, elevated LFT's, recurrent metastatic breast cancer s/p left side mastectomy chemotherapy and radiation. Found to have a liver mass. Team is requesting a liver mass biopsy CT CAP from 4.2.24 reads  Liver is enlarged, inhomogeneous with multiple enhancing lesions throughout the right and left lobe of the liver consistent with extensive numerous metastatic liver lesions. Liver appears completely replaced by metastatic disease. Team is requesting a liver mass biopsy for further evaluation of treatment options. Case reviewed and approved by IR Attending Dr. Erica Hau.  Currently without any significant complaints. Patient alert and laying in bed,calm. Denies any fevers, headache, chest pain, SOB, cough, abdominal pain, nausea, vomiting or bleeding.    *** All Labs and medications are within acceptable parameters. NKDA. Patient has been NPO since midnight  Return precautions and treatment recommendations and follow-up discussed with the patient *** who is agreeable with the plan.    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    Port-A-Cath in place 09/28/2018   Recurrent breast cancer, left [facility]) oncologist-- dr Charolett Copes    dx 2004, noninvasive Stage 0 ----s/p left mastectomy w/ tram flap construction (and right breast reduction), taken Tamoxifen for 3 yrs;   08-30-2018 recurrent left cancer , Grade III,  cT1c,  ER positive, PR negative, HER-2 positive, invasive  ductal carcinoma-- neoadjuvant chemo to start 09-26-2018   Renal artery stenosis (HCC)    mild right external renal artery stenosis per duplex in epic 08-09-2013   Wears glasses     Past Surgical History:  Procedure Laterality Date   BREAST LUMPECTOMY WITH RADIOACTIVE SEED LOCALIZATION Left 02/20/2019   Procedure: LEFT BREAST LUMPECTOMY WITH RADIOACTIVE SEED LOCALIZATION;  Surgeon: Enid Harry, MD;  Location: [facility] OR;  Service: General;  Laterality: Left;   BREAST SURGERY Left    Transflap   COLONOSCOPY     COLONOSCOPY     MASTECTOMY Left 2004   w/  TRAM flap construction and right breast augmentation with abdominoplasy   PARS PLANA VITRECTOMY Left 12/18/2018   Procedure: PARS PLANA VITRECTOMY WITH 25 GAUGE, ENDOLASER;  Surgeon: Jearline Minder, MD;  Location: [facility] OR;  Service: Ophthalmology;  Laterality: Left;   PORTACATH PLACEMENT N/A 09/25/2018   Procedure: INSERTION PORT-A-CATH WITH ULTRASOUND;  Surgeon: Enid Harry, MD;  Location: [facility] ORS;  Service: General;  Laterality: N/A;   TUBAL LIGATION Bilateral yrs ago    Allergies: Patient has no known allergies.  Medications: Prior to Admission medications   Medication Sig Start Date End Date Taking? Authorizing Provider  alendronate  (FOSAMAX ) 70 MG tablet Take 1 tablet (70 mg total) by mouth every 7 (seven) days. Take with a full glass of water  on an empty stomach. 09/30/23   Nche, Connye Delaine, NP  amLODipine  (NORVASC ) 10 MG tablet Take 1 tablet by mouth daily. 09/30/23   Nche, Connye Delaine, NP  anastrozole  (ARIMIDEX ) 1 MG tablet TAKE 1 TABLET(1 MG) BY MOUTH DAILY 09/23/23   Iruku, Praveena, MD  atorvastatin  (LIPITOR) 20 MG tablet  Take 1 tablet (20 mg total) by mouth daily. 10/28/22   Nche, Connye Delaine, NP  brimonidine  (ALPHAGAN ) 0.15 % ophthalmic solution 1 drop 2 (two) times daily. 06/06/23   [provider]  brimonidine  (ALPHAGAN ) 0.2 % ophthalmic solution SMARTSIG:In Eye(s) 05/18/23   [provider]   dorzolamide -timolol  (COSOPT ) 2-0.5 % ophthalmic solution 1 drop 2 (two) times daily. 06/06/23   [provider]  losartan  (COZAAR ) 50 MG tablet TAKE 1 TABLET(50 MG) BY MOUTH DAILY 09/30/23   Nche, Connye Delaine, NP  prochlorperazine  (COMPAZINE ) 10 MG tablet Take 1 tablet (10 mg total) by mouth every 6 (six) hours as needed (Nausea or vomiting). 09/18/18 01/11/19  Magrinat, Rozella Cornfield, MD     Family History  Problem Relation Age of Onset   Diabetes Mother    Hypertension Mother    Kidney disease Father    Colon cancer Neg Hx    Colon polyps Neg Hx    Gallbladder disease Neg Hx    Heart disease Neg Hx    Esophageal cancer Neg Hx    Stomach cancer Neg Hx    Rectal cancer Neg Hx     Social History   Socioeconomic History   Marital status: Married    Spouse name: Not on file   Number of children: 2   Years of education: Not on file   Highest education level: Not on file  Occupational History   Occupation: [employer]  Tobacco Use   Smoking status: Never   Smokeless tobacco: Never  Vaping Use   Vaping status: Never Used  Substance and Sexual Activity   Alcohol use: Not Currently    Alcohol/week: 0.0 standard drinks of alcohol    Comment: Occassionally   Drug use: No   Sexual activity: Not on file  Other Topics Concern   Not on file  Social History Narrative   Not on file   Social Drivers of Health   Financial Resource Strain: Low Risk  (10/27/2022)   Overall Financial Resource Strain (CARDIA)    Difficulty of Paying Living Expenses: Not hard at all  Food Insecurity: No Food Insecurity (11/04/2023)   Hunger Vital Sign    Worried About Running Out of Food in the Last Year: Never true    Ran Out of Food in the Last Year: Never true  Transportation Needs: No Transportation Needs (11/04/2023)   PRAPARE - Administrator, Civil Service (Medical): No    Lack of Transportation (Non-Medical): No  Physical Activity: Insufficiently Active (10/27/2022)   Exercise Vital  Sign    Days of Exercise per Week: 7 days    Minutes of Exercise per Session: 20 min  Stress: No Stress Concern Present (10/27/2022)   [employer] of Occupational Health - Occupational Stress Questionnaire    Feeling of Stress : Not at all  Social Connections: Moderately Isolated (10/27/2022)   Social Connection and Isolation Panel [NHANES]    Frequency of Communication with Friends and Family: More than three times a week    Frequency of Social Gatherings with Friends and Family: More than three times a week    Attends Religious Services: Never    Database administrator or Organizations: No    Attends Banker Meetings: Never    Marital Status: Married    ECOG Status: {CHL ONC ECOG ZO:[id number]}  Review of Systems: A 12 point ROS discussed and pertinent positives are indicated in the HPI above.  All other systems are negative.  Review of Systems  Vital Signs: LMP 06/08/2007 Comment: Just stopped having period    Physical Exam  Imaging: CT CHEST ABDOMEN PELVIS W CONTRAST Result Date: 10/26/2023 CLINICAL DATA:  Metastatic disease evaluation. Left lower lobe mass history of left breast cancer EXAM: CT CHEST, ABDOMEN, AND PELVIS WITH CONTRAST TECHNIQUE: Multidetector CT imaging of the chest, abdomen and pelvis was performed following the standard protocol during bolus administration of intravenous contrast. RADIATION DOSE REDUCTION: This exam was performed according to the departmental dose-optimization program which includes automated exposure control, adjustment of the mA and/or kV according to patient size and/or use of iterative reconstruction technique. CONTRAST:  100mL ISOVUE -300 IOPAMIDOL  (ISOVUE -300) INJECTION 61% COMPARISON:  Chest x-ray Dec 17, 2023, CT chest September 14, 2018, CT abdomen October 15, 2015 FINDINGS: CT CHEST FINDINGS Cardiovascular: No significant vascular findings. Normal heart size. No pericardial effusion. No significant coronary artery  calcifications Mediastinum/Nodes: No enlarged mediastinal, hilar, or axillary lymph nodes. Thyroid  gland, trachea, and esophagus demonstrate no significant findings. Lungs/Pleura: Lungs are clear. No pleural effusion or pneumothorax. No pulmonary nodules 3.8 by 5.5 by 5 cm left anterior chest wall mass along the mid axillary line with rib involvement and destruction and low-attenuation changes of the center indicating necrotic mass correlates with a neoplastic lesion likely metastatic disease. Musculoskeletal: Rib lesion as described above CT ABDOMEN PELVIS FINDINGS Hepatobiliary: Liver is enlarged, inhomogeneous with multiple enhancing lesions throughout the right and left lobe of the liver consistent with extensive numerous metastatic liver lesions. Liver appears completely replaced by metastatic disease. Gallbladder not clearly identified without biliary dilatation. Pancreas: Unremarkable. No pancreatic ductal dilatation or surrounding inflammatory changes. Spleen: Small spleen without lesions Adrenals/Urinary Tract: Adrenal glands are unremarkable. Kidneys are normal, without renal calculi, focal lesion, or hydronephrosis. Bladder is unremarkable. Stomach/Bowel: Stomach is within normal limits. Appendix appears normal. No evidence of bowel wall thickening, distention, or inflammatory changes. Vascular/Lymphatic: Aortic atherosclerosis. No enlarged abdominal or pelvic lymph nodes. Reproductive: Uterus and bilateral adnexa are unremarkable. Other: No abdominal wall hernia or abnormality. No abdominopelvic ascites. Musculoskeletal: Lytic metastatic bone destructive lesion involving the right anterior superior iliac crest measuring 3.4 x 2.4 cm. Similar lesion on the left iliac bone measuring 3.5 x 2.4 cm. Healed fracture of the right proximal pubic ramus right inferior pubic ramus demonstrates a lytic destructive bone lesion measuring 2.2 x 1.7 cm 1.8 x 1 cm lytic bone lesion involving the left iliac bone of the  level of the left sacroiliac joint Suggestive of lytic bone changes involving the L4 vertebral body as well as the upper thoracic vertebral bodies T1-T2. IMPRESSION: *3.8 x 5.5 x 5 cm left anterior chest wall mass along the mid axillary line with rib involvement and destruction and low-attenuation changes of the center indicating necrotic mass correlates with a neoplastic lesion likely metastatic disease. *Liver is enlarged, inhomogeneous with multiple enhancing lesions throughout the right and left lobe of the liver consistent with extensive numerous metastatic liver lesions. Liver appears completely replaced by metastatic disease. 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 *Suggestive of lytic bone changes involving the upper thoracic vertebral bodies T1-T2. *Aortic atherosclerosis. Electronically Signed   By: Fredrich Jefferson M.D.   On: 10/26/2023 15:14   DG Chest 2 View Result Date: 10/18/2023 CLINICAL DATA:  Unintentional weight loss. EXAM: CHEST - 2 VIEW COMPARISON:  Chest CT dated 09/14/2018. FINDINGS: There is an apparent pleural based  mass in the left lower lung field seen on the PA view. Further evaluation with chest CT is recommended. The right lung is clear. No pleural effusion or pneumothorax. The cardiac silhouette is within normal limits. Focal expansile lucency of the posterior right eighth rib concerning for a metastatic disease or pathologic fracture. IMPRESSION: Findings concerning for a left lung base pleural base mass and posterior right eighth rib metastatic lesion. Further evaluation with CT is recommended. Electronically Signed   By: Angus Bark M.D.   On: 10/18/2023 13:18    Labs:  CBC: Recent Labs    07/11/23 1239 10/27/23 1243  WBC 7.0 4.3  HGB 12.1 11.5*  HCT 35.4* 33.7*  PLT 311 289    COAGS: No results for input(s): "INR", "APTT" in the last  8760 hours.  BMP: Recent Labs    07/04/23 1010 07/11/23 1239 10/17/23 1107 10/27/23 1243  NA 138 137 136 140  K 3.7 3.2* 3.6 3.8  CL 102 104 103 105  CO2 27 26 26 29   GLUCOSE 95 95 99 99  BUN 13 14 10 10   CALCIUM  9.6 9.9 9.3 9.7  CREATININE 0.70 0.73 0.75 0.81  GFRNONAA  --  >60  --  >60    LIVER FUNCTION TESTS: Recent Labs    07/04/23 1010 07/11/23 1239 10/17/23 1107 10/27/23 1243  BILITOT 0.4 0.3 0.4 0.3  AST 31 41 43* 40  ALT 29 43 38* 36  ALKPHOS 92 97 173* 191*  PROT 7.3 7.5 6.9 7.3  ALBUMIN 4.1 4.4 4.0  4.0 4.3     Assessment and Plan:  58 yo. Female outpatient. History of HTN, anemia, renal artery stenosis, elevated LFT's, recurrent metastatic breast cancer s/p left side mastectomy, chemotherapy and radiation. Found to have a liver mass. Team is requesting a liver mass biopsy CT CAP from 4.2.24 reads  Liver is enlarged, inhomogeneous with multiple enhancing lesions throughout the right and left lobe of the liver consistent with extensive numerous metastatic liver lesions. Liver appears completely replaced by metastatic disease. Team is requesting a liver mass biopsy for further evaluation of treatment options. Case reviewed and approved by IR Attending Dr. Erica Hau.  *** All Labs and medications are within acceptable parameters. NKDA. Patient has been NPO since midnight   PLAN: IR Image Guided Liver Mass Biopsy   Risks and benefits of liver mass biopsy  was discussed with the patient and/or patient's family including, but not limited to bleeding, infection, damage to adjacent structures or low yield requiring additional tests.  All of the questions were answered and there is agreement to proceed.  Consent signed and in chart.   Thank you for this interesting consult.  I greatly enjoyed meeting Paige Foster and look forward to participating in their care.  A copy of this report was sent to the requesting provider on this date.  Electronically  Signed: Marceil Sensor, NP 11/13/2023, 6:35 PM   I spent a total of {New ZOXW:[id number]} {New Out-Pt:[id number]}  {Established Out-Pt:[id number]} in face to face in clinical consultation, greater than 50% of which was counseling/coordinating care for ***

## 2023-11-14 ENCOUNTER — Ambulatory Visit (HOSPITAL_COMMUNITY)
Admission: RE | Admit: 2023-11-14 | Discharge: 2023-11-14 | Disposition: A | Discharge status: Home / Self Care | Source: Ambulatory Visit | Attending: Adult Health | Admitting: Adult Health

## 2023-11-14 ENCOUNTER — Other Ambulatory Visit: Payer: Self-pay

## 2023-11-14 ENCOUNTER — Other Ambulatory Visit: Payer: Self-pay | Admitting: Adult Health

## 2023-11-14 DIAGNOSIS — C7989 Secondary malignant neoplasm of other specified sites: Secondary | ICD-10-CM | POA: Insufficient documentation

## 2023-11-14 DIAGNOSIS — C50919 Malignant neoplasm of unspecified site of unspecified female breast: Secondary | ICD-10-CM | POA: Diagnosis not present

## 2023-11-14 DIAGNOSIS — C50912 Malignant neoplasm of unspecified site of left female breast: Secondary | ICD-10-CM

## 2023-11-14 DIAGNOSIS — Z9221 Personal history of antineoplastic chemotherapy: Secondary | ICD-10-CM | POA: Diagnosis not present

## 2023-11-14 DIAGNOSIS — C50412 Malignant neoplasm of upper-outer quadrant of left female breast: Secondary | ICD-10-CM

## 2023-11-14 DIAGNOSIS — Z9013 Acquired absence of bilateral breasts and nipples: Secondary | ICD-10-CM | POA: Insufficient documentation

## 2023-11-14 DIAGNOSIS — Z170 Estrogen receptor positive status [ER+]: Secondary | ICD-10-CM | POA: Insufficient documentation

## 2023-11-14 DIAGNOSIS — I10 Essential (primary) hypertension: Secondary | ICD-10-CM | POA: Insufficient documentation

## 2023-11-14 DIAGNOSIS — C7951 Secondary malignant neoplasm of bone: Secondary | ICD-10-CM

## 2023-11-14 DIAGNOSIS — R222 Localized swelling, mass and lump, trunk: Secondary | ICD-10-CM | POA: Diagnosis not present

## 2023-11-14 DIAGNOSIS — Z923 Personal history of irradiation: Secondary | ICD-10-CM | POA: Diagnosis not present

## 2023-11-14 DIAGNOSIS — Z01818 Encounter for other preprocedural examination: Secondary | ICD-10-CM

## 2023-11-14 DIAGNOSIS — C787 Secondary malignant neoplasm of liver and intrahepatic bile duct: Secondary | ICD-10-CM

## 2023-11-14 DIAGNOSIS — R160 Hepatomegaly, not elsewhere classified: Secondary | ICD-10-CM | POA: Diagnosis not present

## 2023-11-14 LAB — CBC
HCT: 32.9 % — ABNORMAL LOW (ref 36.0–46.0)
Hemoglobin: 11.1 g/dL — ABNORMAL LOW (ref 12.0–15.0)
MCH: 31.3 pg (ref 26.0–34.0)
MCHC: 33.7 g/dL (ref 30.0–36.0)
MCV: 92.7 fL (ref 80.0–100.0)
Platelets: 279 10*3/uL (ref 150–400)
RBC: 3.55 MIL/uL — ABNORMAL LOW (ref 3.87–5.11)
RDW: 13.4 % (ref 11.5–15.5)
WBC: 3.9 10*3/uL — ABNORMAL LOW (ref 4.0–10.5)
nRBC: 0 % (ref 0.0–0.2)

## 2023-11-14 LAB — PROTIME-INR
INR: 1.1 (ref 0.8–1.2)
Prothrombin Time: 14 s (ref 11.4–15.2)

## 2023-11-14 MED ORDER — FENTANYL CITRATE (PF) 100 MCG/2ML IJ SOLN
INTRAMUSCULAR | Status: AC | PRN
  Administered 2023-11-14: 25 ug via INTRAVENOUS

## 2023-11-14 MED ORDER — MIDAZOLAM HCL 2 MG/2ML IJ SOLN
INTRAMUSCULAR | Status: AC
  Filled 2023-11-14: qty 2

## 2023-11-14 MED ORDER — MIDAZOLAM HCL 2 MG/2ML IJ SOLN
INTRAMUSCULAR | Status: AC | PRN
  Administered 2023-11-14: 1 mg via INTRAVENOUS

## 2023-11-14 MED ORDER — LIDOCAINE HCL (PF) 1 % IJ SOLN
12.0000 mL | Freq: Once | INTRAMUSCULAR | Status: AC
  Administered 2023-11-14: 12 mL via INTRADERMAL

## 2023-11-14 MED ORDER — FENTANYL CITRATE (PF) 100 MCG/2ML IJ SOLN
INTRAMUSCULAR | Status: AC
  Filled 2023-11-14: qty 2

## 2023-11-14 NOTE — Procedures (Signed)
  Procedure:  US  core biopsy L rib lesion 18g x3 Preprocedure diagnosis: Diagnoses of Malignant neoplasm of upper-outer quadrant of left breast in female, estrogen receptor positive (HCC), Recurrent breast cancer, left (HCC), and Preop testing were pertinent to this visit. Postprocedure diagnosis: same EBL:    minimal Complications:   none immediate  See full dictation in [employer].  Nicky Barrack MD Main # [phone] Pager  [phone] Mobile [phone]

## 2023-11-17 LAB — SURGICAL PATHOLOGY

## 2023-11-17 NOTE — Progress Notes (Addendum)
 Radiation Oncology         [phone] ________________________________  Follow-Up New Visit   Outpatient   Name: Paige Foster MRN: [id number]  Date: 11/18/2023  DOB: 11/05/1965  WU:[id number], Connye Delaine, NP  Murleen Arms, MD   REFERRING PHYSICIAN: Murleen Arms, MD  DIAGNOSIS:    ICD-10-CM   1. Malignant neoplasm of upper-outer quadrant of left breast in female, estrogen receptor positive (HCC)  C50.412    Z17.0     2. Metastasis to bone [facility])  C79.51        Cancer Staging  Malignant neoplasm of upper-outer quadrant of left breast in female, estrogen receptor positive (HCC) Staging form: Breast, AJCC 8th Edition - Clinical stage from 08/30/2018: Stage IA (cT1c, cN0, cM0, G3, ER+, PR-, HER2+) - Signed by Percival Brace, NP on 01/31/2019 Histologic grading system: 3 grade system  Recurrent breast cancer, left (HCC) Staging form: Breast, AJCC 8th Edition - Clinical: Stage IIA (rcT2, cN0, cM0, G3, ER+, PR-, HER2+) - Unsigned Stage prefix: Recurrence Neoadjuvant therapy: No Histologic grading system: 3 grade system   Imaging with evidence of extensive hepatic, osseous metastatic disease likely from a breast cancer primary pending biopsies - w/ recent left rib biopsy confirming metastatic breast carcinoma, ER+ / PR - / Her2 +   History of invasive ductal carcinoma of the left breast diagnosed in 2020, ER+ / PR- / Her2+ / Grade 3: s/p neoadjuvant chemotherapy, left breast lumpectomy, adjuvant radiation therapy, and anastrozole  x 5 years (due to complete anastrozole  this coming Nov 2025)    Initial diagnosis of left breast DCIS in 2004: s/p left mastectomy w/ TRAM reconstruction followed by Tamoxifen x3 years   CHIEF COMPLAINT: Here to discuss management of metastatic breast cancer  Narrative / Interval History - 11/18/23::Paige Foster is a 58 y.o. female who returns today to further discuss the role of radiation therapy in management of her recently diagnosed  osseous metastatic disease from a breast cancer primary.   To review from her initial consultation visit, we discussed the need for further imaging of the right shoulder, as well as of the cervical and thoracic spine, to confirm the etiology of her right shoulder and spinal pain.   She accordingly presented for MRI's of the right shoulder, cervical, and thoracic spine on 11/11/23 which collectively demonstrated: disease in her right shoulder and diffuse spine c/w metastases  Needle core biopsies from the left rib were also obtained on 11/14/23 which revealed: metastatic carcinoma, consistent with a metastatic breast primary. ER status 100% positive with strong staining intensity; PR status 0% negative; Her2 status positive.   She also recently followed up with Dr. Arno Bibles on 11/10/23. Based on Dr. Lorella Roles recommendations, she has agreed to transition from anastrozole  to Faslodex. She again expressed concerns to Dr. Arno Bibles regarding treatment cost, and also expressed that she would prefer to pursue non-aggressive treatments to avoid medical debt.   The shoulder pain is severe enough to limit the patient's comfort in raising her arm but she can raise it. The patient reports no significant change in these symptoms since the last consultation. Recent MRIs suggest the presence of cancer in the spine and possibly the shoulder, which may be the source of the patient's pain. The patient denies experiencing any pain in the right hip, which was a previous concern. The patient also reports a lack of appetite but is supplementing her diet with protein shakes and smoothies.   HPI - Initial Consultation - 11/04/23 ::Paige Economy  Foster is a 58 y.o. female who is known to me for his history of recurrent left breast cancer diagnosed in 2020, s/p neoadjuvant chemotherapy, followed by a left breast lumpectomy and adjuvant radiation therapy completed in November of 2020. She has been on anastrozole  since that time due to be  completed this coming November) and has continued to follow with Dr. Arno Bibles.  Prior to her present diagnosis of metastatic breast cancer, she had a routine bilateral screening mammogram on 06/17/23 that showed no evidence of malignancy in either breast.    This past December 2024, the patient developed acute onset right hip pain and lower back pain. She was evaluated by her PCP for this on 07/18/23 and had an x-ray of the pelvis performed that day which showed mild degenerative findings but no acute findings to account for her hip pain. An x-ray of the lumbar spine was also obtained on 07/26/23 which again showed no acute findings and mild degenerative changes.    Approximately a month later, she also presented to the ED on 08/03/23 with acute onset right shoulder pain with radiation down into her right arm. An x-ray of the right shoulder was obtained and showed concurrent findings to her hip and lower back x-rays (I.e. mild degenerative changes and no acute findings). Physical exam findings were also unremarkable and she was advised to take NSAIDs for pain management at discharge.    This past March (2025), the patient then presented to her PCP with several complaints, most notably consisting of unintentional weight loss (7 lbs over the course of 2 months), dyspnea, and nausea. Based on her cancer history and reported symptoms, a chest x-ray was obtained on 10/17/23 which demonstrated an apparent pleural based mass in the left lower lung field as well as evidence of a possible metastatic posterior right 8th rib lesion.    A CT CAP with contrast was accordingly performed on 10/26/23 which unfortunately confirmed evidence of metastatic disease, characterized by: a necrotic appearing 3.8 x 5.5 x 5 cm left anterior chest wall mass along the mid axillary line with rib involvement and destruction; an enlarged liver with multiple metastatic enhancing lesions throughout the bilateral lobes, with the liver overall  appearing to be completely replaced by metastatic disease; and lytic metastatic bone lesions involving the right anterior superior iliac crest, left iliac bone, right inferior pubic ramus, left iliac bone, and L4 vertebral body. (The metastatic lesions involving the right anterior-superior iliac crest and left iliac bone are the most significant, measuring 3.4 x 2.4 cm and 3.5 x 2.4 cm, respectfully). Other notable findings include a separate lytic bone lesion involving the left iliac bone at the level of left sacroiliac joint measuring 1.8 cm, and possibly lytic bone changes involving the upper thoracic vertebral bodies at the level T1-T2. CT findings otherwise show no evidence of metastatic lymphadenopathy in the chest, abdomen, or pelvis.    She recently followed up with Dr. Arno Bibles on 10/28/23 to discuss these findings. For her osseous metastatic disease, Dr. Arno Bibles has recommended radiation therapy for pain control and fracture prevention which we will discuss in detail today. With regards to her other sites of metastatic disease, Dr. Arno Bibles has recommended proceeding with liver biopsies to confirm her diagnosis and to guide treatment planning.    Pertaining to further systemic treatment, the patient has expressed concerns to Dr. Arno Bibles surrounding her quality of life and the potential financial burden associated with treatment. Dr. Arno Bibles has discussed financial management with the patient and  has advised her to complete paperwork for financial assistance. The patient has also expressed to Dr. Arno Bibles that she is prepared for end-of-life measures and that her primary focus is symptom management and preserving her quality of life. She is scheduled to undergo liver biopsies on 11/14/23 and will follow up with Dr. Arno Bibles to discuss these results and to further discuss her treatment plan/goals.    Today, she presents with pain in her right hip and right shoulder. The pain in the right shoulder is particularly  bothersome, making it difficult for the patient to lift her arm. The patient reports that the pain seems to originate from the back of the shoulder, near the spine, and does not radiate down the arm.     The patient also experiences intermittent pain in the right hip, which is not as severe as the shoulder pain. There was a period when the patient was unable to walk due to the hip pain, but after undergoing physical therapy and exercises, the patient was able to walk again and return to work earlier than expected.   Interestingly, despite the presence of cancer in the left side on CT imaging of pelvis/ribs, the patient reports no pain in these areas. The patient is concerned about the financial burden of further treatment and is seeking financial aid.  She does have insurance  PREVIOUS RADIATION THERAPY: Yes   Radiation Treatment Dates: 04/12/2019 through 05/28/2019 Site Technique Total Dose (Gy) Dose per Fx (Gy) Completed Fx Beam Energies  Breast: CW_Lt 3D 50.4/50.4 1.8 28/28 6X, 10X  Breast: CW_Lt_SCV_PAB 3D 50.4/50.4 1.8 28/28 6X, 10X  Breast: CW_Lt_Bst Electron 10/10 2 5/5 6X, 10X      PREVIOUS SYSTEMIC THERAPY:    Neoadjuvant chemotherapy consisting of carboplatin , docetaxel , trastuzumab  and Pertuzumab  starting 09/26/2018, repeated every 21 days x 6, last dose 09/06/2019     PAST MEDICAL HISTORY:  has a past medical history of Anemia, Breast cancer (HCC), History of blood transfusion (2004), History of colon polyps, Hypertension, Neuromuscular disorder (HCC), Port-A-Cath in place (09/28/2018), Recurrent breast cancer, left [facility]) (oncologist-- dr Charolett Copes ), Renal artery stenosis (HCC), and Wears glasses.    PAST SURGICAL HISTORY: Past Surgical History:  Procedure Laterality Date   BREAST LUMPECTOMY WITH RADIOACTIVE SEED LOCALIZATION Left 02/20/2019   Procedure: LEFT BREAST LUMPECTOMY WITH RADIOACTIVE SEED LOCALIZATION;  Surgeon: Enid Harry, MD;  Location: [facility] OR;  Service:  General;  Laterality: Left;   BREAST SURGERY Left    Transflap   COLONOSCOPY     COLONOSCOPY     MASTECTOMY Left 2004   w/  TRAM flap construction and right breast augmentation with abdominoplasy   PARS PLANA VITRECTOMY Left 12/18/2018   Procedure: PARS PLANA VITRECTOMY WITH 25 GAUGE, ENDOLASER;  Surgeon: Jearline Minder, MD;  Location: [facility] OR;  Service: Ophthalmology;  Laterality: Left;   PORTACATH PLACEMENT N/A 09/25/2018   Procedure: INSERTION PORT-A-CATH WITH ULTRASOUND;  Surgeon: Enid Harry, MD;  Location: [facility] ORS;  Service: General;  Laterality: N/A;   TUBAL LIGATION Bilateral yrs ago    FAMILY HISTORY: family history includes Diabetes in her mother; Hypertension in her mother; Kidney disease in her father.  SOCIAL HISTORY:  reports that she has never smoked. She has never used smokeless tobacco. She reports that she does not currently use alcohol. She reports that she does not use drugs.  ALLERGIES: Bee pollen  MEDICATIONS:  Current Outpatient Medications  Medication Sig Dispense Refill   amLODipine  (NORVASC ) 10 MG tablet Take 1 tablet by mouth  daily. 90 tablet 2   anastrozole  (ARIMIDEX ) 1 MG tablet TAKE 1 TABLET(1 MG) BY MOUTH DAILY 90 tablet 4   atorvastatin  (LIPITOR) 20 MG tablet Take 1 tablet (20 mg total) by mouth daily. 90 tablet 3   brimonidine  (ALPHAGAN ) 0.2 % ophthalmic solution SMARTSIG:In Eye(s)     dorzolamide -timolol  (COSOPT ) 2-0.5 % ophthalmic solution 1 drop 2 (two) times daily.     losartan  (COZAAR ) 50 MG tablet TAKE 1 TABLET(50 MG) BY MOUTH DAILY 90 tablet 3   alendronate  (FOSAMAX ) 70 MG tablet Take 1 tablet (70 mg total) by mouth every 7 (seven) days. Take with a full glass of water  on an empty stomach. (Patient not taking: Reported on 11/18/2023) 12 tablet 3   brimonidine  (ALPHAGAN ) 0.15 % ophthalmic solution 1 drop 2 (two) times daily.     No current facility-administered medications for this encounter.    REVIEW OF SYSTEMS: As above in HPI.    PHYSICAL EXAM:  height is 5\' 4"  (1.626 m) and weight is 128 lb 2 oz (58.1 kg). Her temporal temperature is 97.3 F (36.3 C) (abnormal). Her blood pressure is 125/79 and her pulse is 59 (abnormal). Her respiration is 18 and oxygen saturation is 100%.   Physical Exam GENERAL: Well nourished and well appearing. HEENT: EOMI EXTREMITIES: No swelling in ankles. MUSCULOSKELETAL: Good range of motion in shoulders, pain in right shoulder on elevation. No tenderness on palpation of right shoulder and upper arm. Tenderness on palpation of lower right shoulder blade. No tenderness on palpation of cervical, thoracic, and lumbar spine. NEUROLOGICAL: Strength in extremities intact. No numbness in extremities.   ECOG = 1  0 - Asymptomatic (Fully active, able to carry on all predisease activities without restriction)  1 - Symptomatic but completely ambulatory (Restricted in physically strenuous activity but ambulatory and able to carry out work of a light or sedentary nature. For example, light housework, office work)  2 - Symptomatic, <50% in bed during the day (Ambulatory and capable of all self care but unable to carry out any work activities. Up and about more than 50% of waking hours)  3 - Symptomatic, >50% in bed, but not bedbound (Capable of only limited self-care, confined to bed or chair 50% or more of waking hours)  4 - Bedbound (Completely disabled. Cannot carry on any self-care. Totally confined to bed or chair)  5 - Death   Aurea Blossom MM, Creech RH, Tormey DC, et al. [phone]). "Toxicity and response criteria of the [facility]". Am. Hillard Lowes. Oncol. 5 (6): 649-55   LABORATORY DATA:   CBC    Component Value Date/Time   WBC 3.9 (L) 11/14/2023 0659   RBC 3.55 (L) 11/14/2023 0659   HGB 11.1 (L) 11/14/2023 0659   HGB 11.5 (L) 10/27/2023 1243   HGB 13.4 10/12/2017 0950   HGB 12.7 08/30/2007 1204   HCT 32.9 (L) 11/14/2023 0659   HCT 40.6 10/12/2017 0950   HCT 38.6  08/30/2007 1204   PLT 279 11/14/2023 0659   PLT 289 10/27/2023 1243   PLT 307 10/12/2017 0950   MCV 92.7 11/14/2023 0659   MCV 93 10/12/2017 0950   MCV 90.2 08/30/2007 1204   MCH 31.3 11/14/2023 0659   MCHC 33.7 11/14/2023 0659   RDW 13.4 11/14/2023 0659   RDW 13.4 10/12/2017 0950   RDW 12.4 08/30/2007 1204   LYMPHSABS 1.2 10/27/2023 1243   LYMPHSABS 2.2 08/30/2007 1204   MONOABS 0.5 10/27/2023 1243   MONOABS 0.4 08/30/2007 1204  EOSABS 0.0 10/27/2023 1243   EOSABS 0.1 08/30/2007 1204   BASOSABS 0.0 10/27/2023 1243   BASOSABS 0.0 08/30/2007 1204    CMP     Component Value Date/Time   NA 140 10/27/2023 1243   NA 140 10/12/2017 0950   K 3.8 10/27/2023 1243   CL 105 10/27/2023 1243   CO2 29 10/27/2023 1243   GLUCOSE 99 10/27/2023 1243   BUN 10 10/27/2023 1243   BUN 10 10/12/2017 0950   CREATININE 0.81 10/27/2023 1243   CALCIUM  9.7 10/27/2023 1243   PROT 7.3 10/27/2023 1243   PROT 7.5 10/12/2017 0950   ALBUMIN 4.3 10/27/2023 1243   ALBUMIN 4.3 10/12/2017 0950   AST 40 10/27/2023 1243   ALT 36 10/27/2023 1243   ALKPHOS 191 (H) 10/27/2023 1243   BILITOT 0.3 10/27/2023 1243   GFR 87.30 10/17/2023 1107   GFRNONAA >60 10/27/2023 1243      RADIOGRAPHY: MR SHOULDER RIGHT W WO CONTRAST Result Date: 11/18/2023 CLINICAL DATA:  Chronic right shoulder pain. Metastatic breast cancer. Spinal metastases. EXAM: MRI OF THE RIGHT SHOULDER WITHOUT AND WITH CONTRAST TECHNIQUE: Multiplanar, multisequence MR imaging of the right shoulder was performed before and after the administration of intravenous contrast. CONTRAST:  5mL GADAVIST  GADOBUTROL  1 MMOL/ML IV SOLN COMPARISON:  Right shoulder radiographs 08/03/2023 FINDINGS: Rotator cuff: Moderate to high-grade intermediate T2 signal and thickening of the anterior infraspinatus tendon footprint, high-grade tendinosis and region measuring up to approximately 2 cm in AP dimension. Mild subcortical cystic change within the underlying humeral head.  The infraspinatus, subscapularis, and teres minor are intact. Muscles: No rotator cuff muscle atrophy, fatty infiltration, or edema. Biceps long head: Mild to moderate intermediate T2 signal and thickening long head of the biceps tendinosis proximal to the bicipital groove. Acromioclavicular Joint: There are mild degenerative changes of the acromioclavicular joint including joint space narrowing, subchondral marrow edema, and peripheral osteophytosis. Type II acromion. No fluid within the subacromial/subdeltoid bursa. Glenohumeral Joint: Moderate glenohumeral cartilage thinning. Mild-to-moderate glenoid subchondral cystic changes. No glenohumeral joint effusion. Labrum: There is attenuation and degenerative irregularity of the posterosuperior glenoid labrum. Bones: There is an intermediate to decreased T1 and intermediate to increased T2 signal lobular and irregular lesion with mild heterogeneous enhancement that is centered within the junction of the scapular body, spine, and base of the coracoid process (a axial image 11, sagittal image 1, coronal images 8 and 9). This measures up to approximately 2.1 x 1.5 x 2.0 cm (transverse by AP by craniocaudal). This erodes the anterior cortex (axial series 14 images 11 and 12). Additional indeterminate ill-defined decreased T1 and increased T2 signal lesion measuring up to 9 mm within the proximal humeral surgical neck, centered mildly anteriorly (sagittal image 11, axial image 17, coronal image 7). No aggressive feature at this time, and this may represent a benign lesion, however an early metastasis is difficult to exclude. Other: None. IMPRESSION: 1. There is a 2.1 cm lobular, irregular, and mildly enhancing lesion centered within the junction of the scapular body, spine, and base of the coracoid process with erosion of the anterior cortex. This is highly suspicious for a bone metastasis given the patient's other known bone metastases. No pathologic fracture is seen. 2.  Additional indeterminate ill-defined 9 mm lesion within the proximal humeral surgical neck. No aggressive feature at this time, and this may represent a benign lesion, however an early metastasis is difficult to exclude. 3. Moderate to high-grade anterior infraspinatus tendinosis. 4. Mild to moderate long head of  the biceps tendinosis proximal to the bicipital groove. 5. Mild degenerative changes of the acromioclavicular joint. 6. Moderate glenohumeral cartilage thinning. Electronically Signed   By: Bertina Broccoli M.D.   On: 11/18/2023 09:16   MR THORACIC SPINE W WO CONTRAST Result Date: 11/18/2023 CLINICAL DATA:  Cervical radiculopathy, known spine metastases. EXAM: MRI CERVICAL AND THORACIC SPINE WITHOUT AND WITH CONTRAST TECHNIQUE: Multiplanar and multiecho pulse sequences of the cervical spine, to include the craniocervical junction and cervicothoracic junction, and the thoracic spine, were obtained without and with intravenous contrast. CONTRAST:  5mL GADAVIST  GADOBUTROL  1 MMOL/ML IV SOLN COMPARISON:  None Available. FINDINGS: MRI CERVICAL SPINE FINDINGS Alignment: Physiologic. Vertebrae: Multiple infiltrative bone lesions consistent with metastatic disease: *Clivus and left paramedian occipital bone *Left lamina of C2 *Right articular process of C3 *Body of C5 eccentric to the left *C6 body and left articular process *C7 right body, pedicle, and articular process with extraosseous extension upward into the C6-7 foramen. Conceivable right articular process fracture where there is a hypointense line seen on sagittal STIR and T2 weighted imaging. *T1 involving the body and right articular process with some upward growth likely through pedicular cortex but not clearly impinging on the right C8 nerve root. Cord: No cord impingement or signal abnormality. Posterior Fossa, vertebral arteries, paraspinal tissues: No perispinal inflammation Disc levels: Mild disc space narrowing and bulging seen diffusely. No more  than mild facet spurring. MRI THORACIC SPINE FINDINGS Alignment:  Exaggerated thoracic kyphosis.  No listhesis. Vertebrae: Multiple infiltrating bone lesions: *T1 body and right posterior elements *T2 right articular process with possible early extra cortical extension but no nerve root impingement. *T3 body which involves more than 50% of the volume. *T4 upper and left paramedian body *T5 right transverse process and rib head. *T6 left rib head and neck and right body *T7 multifocal body and right lamina *T8 lower body *Multifocal in the T9 body, left pedicle, and left lamina *T10 body, left pedicle, and right articular process *T11 bilateral body *T12 body and left pedicle *L1 body and left posterior elements *L2 body and spinous process. No extraosseous tumor extension or pathologic fracture. Cord:  Normal signal and morphology Paraspinal and other soft tissues: Widespread hepatic metastatic disease. Disc levels: No significant degenerative change IMPRESSION: Widespread osseous metastatic disease in the cervical and thoracic spine with affected levels listed above. Pertinent to the right upper extremity symptoms, a C7 right-sided deposit extends into the soft tissues and affects the right C6-7 foramen. Pathologic fracture on the right at C7 is conceivable and noncontrast CT may be complementary. No cord impingement seen throughout the covered spine. Electronically Signed   By: Ronnette Coke M.D.   On: 11/18/2023 09:14   MR CERVICAL SPINE W WO CONTRAST Result Date: 11/18/2023 CLINICAL DATA:  Cervical radiculopathy, known spine metastases. EXAM: MRI CERVICAL AND THORACIC SPINE WITHOUT AND WITH CONTRAST TECHNIQUE: Multiplanar and multiecho pulse sequences of the cervical spine, to include the craniocervical junction and cervicothoracic junction, and the thoracic spine, were obtained without and with intravenous contrast. CONTRAST:  5mL GADAVIST  GADOBUTROL  1 MMOL/ML IV SOLN COMPARISON:  None Available. FINDINGS:  MRI CERVICAL SPINE FINDINGS Alignment: Physiologic. Vertebrae: Multiple infiltrative bone lesions consistent with metastatic disease: *Clivus and left paramedian occipital bone *Left lamina of C2 *Right articular process of C3 *Body of C5 eccentric to the left *C6 body and left articular process *C7 right body, pedicle, and articular process with extraosseous extension upward into the C6-7 foramen. Conceivable right articular process fracture where there  is a hypointense line seen on sagittal STIR and T2 weighted imaging. *T1 involving the body and right articular process with some upward growth likely through pedicular cortex but not clearly impinging on the right C8 nerve root. Cord: No cord impingement or signal abnormality. Posterior Fossa, vertebral arteries, paraspinal tissues: No perispinal inflammation Disc levels: Mild disc space narrowing and bulging seen diffusely. No more than mild facet spurring. MRI THORACIC SPINE FINDINGS Alignment:  Exaggerated thoracic kyphosis.  No listhesis. Vertebrae: Multiple infiltrating bone lesions: *T1 body and right posterior elements *T2 right articular process with possible early extra cortical extension but no nerve root impingement. *T3 body which involves more than 50% of the volume. *T4 upper and left paramedian body *T5 right transverse process and rib head. *T6 left rib head and neck and right body *T7 multifocal body and right lamina *T8 lower body *Multifocal in the T9 body, left pedicle, and left lamina *T10 body, left pedicle, and right articular process *T11 bilateral body *T12 body and left pedicle *L1 body and left posterior elements *L2 body and spinous process. No extraosseous tumor extension or pathologic fracture. Cord:  Normal signal and morphology Paraspinal and other soft tissues: Widespread hepatic metastatic disease. Disc levels: No significant degenerative change IMPRESSION: Widespread osseous metastatic disease in the cervical and thoracic spine  with affected levels listed above. Pertinent to the right upper extremity symptoms, a C7 right-sided deposit extends into the soft tissues and affects the right C6-7 foramen. Pathologic fracture on the right at C7 is conceivable and noncontrast CT may be complementary. No cord impingement seen throughout the covered spine. Electronically Signed   By: Ronnette Coke M.D.   On: 11/18/2023 09:14   US  CORE BIOPSY (SOFT TISSUE) Result Date: 11/15/2023 CLINICAL DATA:  Multiple liver lesions, multiple osseous lesions including expansile soft tissue left rib lesion EXAM: ULTRASOUND-GUIDED LEFT RIB SOFT TISSUE MASS CORE BIOPSY TECHNIQUE: survey ultrasound of the left chest wall was performed in the left rib soft tissue mass was identified corresponding to CT findings. An appropriate skin entry site was identified and marked. Region prepped with chlorhexidine , draped in usual sterile fashion, infiltrated locally with 1% lidocaine . Intravenous Fentanyl  25mcg and Versed  1mg  were administered by RN during a total moderate (conscious) sedation time of 11 minutes; the patient's level of consciousness and physiological / cardiorespiratory status were monitored continuously by radiology RN under my direct supervision. Under real-time ultrasound guidance, a 17 gauge trocar needle advanced to the margin of the lesion. Multiple 18 gauge core biopsy samples were obtained, submitted to surgical pathology. The guide needle was removed. Postprocedure scans show no hemorrhage or other apparent complication. COMPLICATIONS: COMPLICATIONS none IMPRESSION: 1. Technically successful ultrasound-guided left soft tissue rib lesion core biopsy. Electronically Signed   By: Nicoletta Barrier M.D.   On: 11/15/2023 07:34   CT CHEST ABDOMEN PELVIS W CONTRAST Result Date: 10/26/2023 CLINICAL DATA:  Metastatic disease evaluation. Left lower lobe mass history of left breast cancer EXAM: CT CHEST, ABDOMEN, AND PELVIS WITH CONTRAST TECHNIQUE: Multidetector CT  imaging of the chest, abdomen and pelvis was performed following the standard protocol during bolus administration of intravenous contrast. RADIATION DOSE REDUCTION: This exam was performed according to the departmental dose-optimization program which includes automated exposure control, adjustment of the mA and/or kV according to patient size and/or use of iterative reconstruction technique. CONTRAST:  100mL ISOVUE -300 IOPAMIDOL  (ISOVUE -300) INJECTION 61% COMPARISON:  Chest x-ray Dec 17, 2023, CT chest September 14, 2018, CT abdomen October 15, 2015 FINDINGS: CT  CHEST FINDINGS Cardiovascular: No significant vascular findings. Normal heart size. No pericardial effusion. No significant coronary artery calcifications Mediastinum/Nodes: No enlarged mediastinal, hilar, or axillary lymph nodes. Thyroid  gland, trachea, and esophagus demonstrate no significant findings. Lungs/Pleura: Lungs are clear. No pleural effusion or pneumothorax. No pulmonary nodules 3.8 by 5.5 by 5 cm left anterior chest wall mass along the mid axillary line with rib involvement and destruction and low-attenuation changes of the center indicating necrotic mass correlates with a neoplastic lesion likely metastatic disease. Musculoskeletal: Rib lesion as described above CT ABDOMEN PELVIS FINDINGS Hepatobiliary: Liver is enlarged, inhomogeneous with multiple enhancing lesions throughout the right and left lobe of the liver consistent with extensive numerous metastatic liver lesions. Liver appears completely replaced by metastatic disease. Gallbladder not clearly identified without biliary dilatation. Pancreas: Unremarkable. No pancreatic ductal dilatation or surrounding inflammatory changes. Spleen: Small spleen without lesions Adrenals/Urinary Tract: Adrenal glands are unremarkable. Kidneys are normal, without renal calculi, focal lesion, or hydronephrosis. Bladder is unremarkable. Stomach/Bowel: Stomach is within normal limits. Appendix appears normal.  No evidence of bowel wall thickening, distention, or inflammatory changes. Vascular/Lymphatic: Aortic atherosclerosis. No enlarged abdominal or pelvic lymph nodes. Reproductive: Uterus and bilateral adnexa are unremarkable. Other: No abdominal wall hernia or abnormality. No abdominopelvic ascites. Musculoskeletal: Lytic metastatic bone destructive lesion involving the right anterior superior iliac crest measuring 3.4 x 2.4 cm. Similar lesion on the left iliac bone measuring 3.5 x 2.4 cm. Healed fracture of the right proximal pubic ramus right inferior pubic ramus demonstrates a lytic destructive bone lesion measuring 2.2 x 1.7 cm 1.8 x 1 cm lytic bone lesion involving the left iliac bone of the level of the left sacroiliac joint Suggestive of lytic bone changes involving the L4 vertebral body as well as the upper thoracic vertebral bodies T1-T2. IMPRESSION: *3.8 x 5.5 x 5 cm left anterior chest wall mass along the mid axillary line with rib involvement and destruction and low-attenuation changes of the center indicating necrotic mass correlates with a neoplastic lesion likely metastatic disease. *Liver is enlarged, inhomogeneous with multiple enhancing lesions throughout the right and left lobe of the liver consistent with extensive numerous metastatic liver lesions. Liver appears completely replaced by metastatic disease. 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 *Suggestive of lytic bone changes involving the upper thoracic vertebral bodies T1-T2. *Aortic atherosclerosis. Electronically Signed   By: Fredrich Jefferson M.D.   On: 10/26/2023 15:14      IMPRESSION/PLAN:     ICD-10-CM   1. Malignant neoplasm of upper-outer quadrant of left breast in female, estrogen receptor positive (HCC)  C50.412    Z17.0     2. 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 Treatment aimed at symptom relief and quality of life. Informed consent for palliative treatment obtained. (Although she has T spine metastases, she is not hurting in the mid spine) - CT simulation today of cervical spine, shoulder, and lumbar spine for RT planning.* - Schedule two weeks of radiation therapy, five days a week.* - Send note to Dr. Arno Bibles for annual thyroid  function tests. - Provide treatment calendar. - pelvis/hip pain resolved - no pelvic RT at this time  Lumbar spine pain Pain in lumbar spine due to metastatic disease. Radiation therapy  planned for pain relief.  Right shoulder pain Right shoulder pain likely from cervical spinal nerve compression and possible shoulder metastasis. Radiation therapy planned for pain relief.  Lack of appetite Managed with  smoothies and lactose-free protein shakes. Emphasized weight maintenance for treatment efficacy. - Encourage use of protein shakes, including Boost, Ensure, or Carnation instant breakfast with lactose-free milk. She declines nutritional consult today.   *ADDENDUM: I conferred with oncology on 4-28 nd was notified that systemic/chemotherapy will start in early May.  In my assessment the patient's current pain is most likely originating from her lower lumbar spine and her right proximal humerus and portions of her right scapula.  We will treat the right shoulder and the lower lumbar spine to 20 Gray in 5 fractions (starting 4-29) so that we can expedite her radiation and allow her to transition straight into systemic therapy.  If she has persistent pain in the future we may consider treating her cervical spine or other parts of her spine later on.  On date of service, in total, I spent 45 minutes on this encounter. Patient was seen in person.   __________________________________________   Colie Dawes, MD  This document serves as a record of services personally performed by Colie Dawes, MD. It was created on her behalf by Aleta Anda, a trained medical scribe. The creation of this record is based on the scribe's personal observations and the provider's statements to them. This document has been checked and approved by the attending provider.

## 2023-11-18 ENCOUNTER — Inpatient Hospital Stay (HOSPITAL_BASED_OUTPATIENT_CLINIC_OR_DEPARTMENT_OTHER): Admitting: Hematology and Oncology

## 2023-11-18 ENCOUNTER — Ambulatory Visit
Admission: RE | Admit: 2023-11-18 | Discharge: 2023-11-18 | Disposition: A | Discharge status: Home / Self Care | Source: Ambulatory Visit | Attending: Radiation Oncology | Admitting: Radiation Oncology

## 2023-11-18 ENCOUNTER — Encounter: Payer: Self-pay | Admitting: Radiation Oncology

## 2023-11-18 ENCOUNTER — Encounter: Payer: Self-pay | Admitting: *Deleted

## 2023-11-18 ENCOUNTER — Inpatient Hospital Stay

## 2023-11-18 ENCOUNTER — Other Ambulatory Visit: Payer: Self-pay | Admitting: *Deleted

## 2023-11-18 VITALS — BP 122/70 | HR 58 | Temp 97.8°F | Resp 18 | Wt 127.0 lb

## 2023-11-18 VITALS — BP 125/79 | HR 59 | Temp 97.3°F | Resp 18 | Ht 64.0 in | Wt 128.1 lb

## 2023-11-18 DIAGNOSIS — C50912 Malignant neoplasm of unspecified site of left female breast: Secondary | ICD-10-CM | POA: Diagnosis not present

## 2023-11-18 DIAGNOSIS — Z170 Estrogen receptor positive status [ER+]: Secondary | ICD-10-CM | POA: Insufficient documentation

## 2023-11-18 DIAGNOSIS — M25511 Pain in right shoulder: Secondary | ICD-10-CM | POA: Insufficient documentation

## 2023-11-18 DIAGNOSIS — R609 Edema, unspecified: Secondary | ICD-10-CM | POA: Diagnosis not present

## 2023-11-18 DIAGNOSIS — M51372 Other intervertebral disc degeneration, lumbosacral region with discogenic back pain and lower extremity pain: Secondary | ICD-10-CM | POA: Diagnosis not present

## 2023-11-18 DIAGNOSIS — C787 Secondary malignant neoplasm of liver and intrahepatic bile duct: Secondary | ICD-10-CM | POA: Insufficient documentation

## 2023-11-18 DIAGNOSIS — Z510 Encounter for antineoplastic radiation therapy: Secondary | ICD-10-CM | POA: Diagnosis not present

## 2023-11-18 DIAGNOSIS — Z9221 Personal history of antineoplastic chemotherapy: Secondary | ICD-10-CM | POA: Insufficient documentation

## 2023-11-18 DIAGNOSIS — C50412 Malignant neoplasm of upper-outer quadrant of left female breast: Secondary | ICD-10-CM | POA: Insufficient documentation

## 2023-11-18 DIAGNOSIS — I10 Essential (primary) hypertension: Secondary | ICD-10-CM | POA: Insufficient documentation

## 2023-11-18 DIAGNOSIS — R630 Anorexia: Secondary | ICD-10-CM | POA: Diagnosis not present

## 2023-11-18 DIAGNOSIS — Z860100 Personal history of colon polyps, unspecified: Secondary | ICD-10-CM | POA: Diagnosis not present

## 2023-11-18 DIAGNOSIS — Z79811 Long term (current) use of aromatase inhibitors: Secondary | ICD-10-CM | POA: Insufficient documentation

## 2023-11-18 DIAGNOSIS — Z1731 Human epidermal growth factor receptor 2 positive status: Secondary | ICD-10-CM | POA: Insufficient documentation

## 2023-11-18 DIAGNOSIS — I700 Atherosclerosis of aorta: Secondary | ICD-10-CM | POA: Insufficient documentation

## 2023-11-18 DIAGNOSIS — Z1722 Progesterone receptor negative status: Secondary | ICD-10-CM | POA: Insufficient documentation

## 2023-11-18 DIAGNOSIS — Z79899 Other long term (current) drug therapy: Secondary | ICD-10-CM | POA: Insufficient documentation

## 2023-11-18 DIAGNOSIS — C7951 Secondary malignant neoplasm of bone: Secondary | ICD-10-CM

## 2023-11-18 NOTE — Progress Notes (Signed)
 DISCONTINUE ON PATHWAY REGIMEN - Breast     A cycle is every 21 days:     Pertuzumab       Pertuzumab       Trastuzumab -xxxx      Trastuzumab -xxxx      Carboplatin       Docetaxel    **Always confirm dose/schedule in your pharmacy ordering system**  PRIOR TREATMENT: [id number]: Docetaxel  + Carboplatin  + Trastuzumab  + Pertuzumab  (TCHP) q21 Days x 6 Cycles  START ON PATHWAY REGIMEN - Breast     Cycle 1: A cycle is 21 days:     Pertuzumab       Trastuzumab -xxxx      Docetaxel     Cycles 2 and beyond: A cycle is every 21 days:     Pertuzumab       Trastuzumab -xxxx      Docetaxel    **Always confirm dose/schedule in your pharmacy ordering system**  Patient Characteristics: Distant Metastases or Locoregional Recurrent Disease - Unresected, M0 or Locally Advanced Unresectable Disease Progressing after Neoadjuvant and Local Therapies, M0, HER2 Positive, ER Positive, Chemotherapy + HER2-Targeted Therapy, First Line Therapeutic Status: Distant Metastases HER2 Status: Positive (+) ER Status: Positive (+) PR Status: Negative (-) Line of Therapy: First Line Intent of Therapy: Non-Curative / Palliative Intent, Discussed with Patient

## 2023-11-18 NOTE — Progress Notes (Signed)
 [facility]  [phone] [phone]    ID: Paige Foster DOB: 03/11/1966  MR#: [id number]  HYQ#:[id number]  Patient Care Team: Kandace Organ, NP as PCP - General (Internal Medicine) Enid Harry, MD as Consulting Physician (General Surgery) Nche, Connye Delaine, NP as Nurse Practitioner (Internal Medicine) Jearline Minder, MD as Consulting Physician (Ophthalmology) Darlis Eisenmenger, MD as Consulting Physician (Cardiology) Dominic Friendly Cordelia Dessert, MD as Consulting Physician (Gastroenterology) [place] Patricia Boon [facility]) OTHER MD:    CHIEF COMPLAINT: 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  History of Present Illness  Paige Foster is a 58 year old female with HER2 positive breast cancer who presents for treatment planning.  She has been diagnosed with HER2 positive and estrogen receptor positive breast cancer. She has previously undergone chemotherapy and tolerated it well, with minimal side effects except for some neuropathy, which has since resolved. She is concerned about potential side effects such as fatigue, nausea, diarrhea, and hair loss, which she experienced during her last chemotherapy regimen.  She experiences some pain in the lower back region, which she describes as bearable. She is not currently taking any pain medication, including Tylenol , and prefers to avoid medication unless the pain becomes unbearable.  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 She emphasizes the importance of her hands for her work, indicating a concern for potential neuropathy affecting her ability to perform her job.  Rest of the pertinent 10 point ROS reviewed and neg.  HISTORY OF CURRENT ILLNESS: From the original intake  note:  Paige Foster has a prior history of left breast cancer, dating back to 2004. At that time she underwent a left mastectomy for stage 0 (noninvasive) breast cancer, with transverse rectus abdominis (TRAM) flap construction under Dr. Lindle Rhea. She also underwent a right breast reduction. She took tamoxifen for three years.  More recently she underwent bilateral diagnostic mammography with tomography and left breast ultrasonography at [facility] on 01/03/2018 showing: Breast Density Category B. There is an oval fat containing lesion in the left breast upper outer quadrant posterior depth. No other significant masses, calcifications, or other findings are seen in either breast. Sonographically, there is a 1.5 cm lesion in the left breast upper outer quadrant posterior depth. This lesion is of mixed echogenicity. This correlates as palpated and with mammography findings. Follow up was recommended.  Close follow-up was suggested.  She then presented with a non-tender mass in the left reconstructed breast on 08/30/2018. On physical exam, there is a hard palpable lump measuring 2.0 cm in the upper outer left reconstructed breast 10 cm from the expected location of a nipple. Sonography over this area demonstrates a 1.8 cm x 1.7 cm x 1.4 cm mass in the left breast at 2 o'clock posterior depth 10 cm from the nipple. This mass is of mixed echogenicity. This abnormality is increased in size and correlates as palpated and with prior mammography findings. Color flow imaging demonstrates that there is vascularity present. Elastography imaging assessment is intermediate. No significant abnormalities were seen sonographically in the left axilla.    Accordingly on 08/30/2018 she proceeded to biopsy of the left breast mass in question. The pathology from this procedure showed ([id number]): invasive ductal carcinoma, grade III. Prognostic indicators significant for: estrogen receptor, 100%  positive with strong staining  intensity and progesterone receptor, 0% negative. Proliferation marker Ki67 at 15%. HER2 positive (3+) by immunohistochemistry.  The patient's subsequent history is as detailed below.   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    Port-A-Cath in place 09/28/2018   Recurrent breast cancer, left [facility]) oncologist-- dr Charolett Copes    dx 2004, noninvasive Stage 0 ----s/p left mastectomy w/ tram flap construction (and right breast reduction), taken Tamoxifen for 3 yrs;   08-30-2018 recurrent left cancer , Grade III,  cT1c,  ER positive, PR negative, HER-2 positive, invasive ductal carcinoma-- neoadjuvant chemo to start 09-26-2018   Renal artery stenosis (HCC)    mild right external renal artery stenosis per duplex in epic 08-09-2013   Wears glasses     PAST SURGICAL HISTORY: Past Surgical History:  Procedure Laterality Date   BREAST LUMPECTOMY WITH RADIOACTIVE SEED LOCALIZATION Left 02/20/2019   Procedure: LEFT BREAST LUMPECTOMY WITH RADIOACTIVE SEED LOCALIZATION;  Surgeon: Enid Harry, MD;  Location: [facility] OR;  Service: General;  Laterality: Left;   BREAST SURGERY Left    Transflap   COLONOSCOPY     COLONOSCOPY     MASTECTOMY Left 2004   w/  TRAM flap construction and right breast augmentation with abdominoplasy   PARS PLANA VITRECTOMY Left 12/18/2018   Procedure: PARS PLANA VITRECTOMY WITH 25 GAUGE, ENDOLASER;  Surgeon: Jearline Minder, MD;  Location: [facility] OR;  Service: Ophthalmology;  Laterality: Left;   PORTACATH PLACEMENT N/A 09/25/2018   Procedure: INSERTION PORT-A-CATH WITH ULTRASOUND;  Surgeon: Enid Harry, MD;  Location: [facility] ORS;  Service: General;  Laterality: N/A;   TUBAL LIGATION Bilateral yrs ago    FAMILY HISTORY: Family History  Problem Relation Age of Onset   Diabetes Mother     Hypertension Mother    Kidney disease Father    Colon cancer Neg Hx    Colon polyps Neg Hx    Gallbladder disease Neg Hx    Heart disease Neg Hx    Esophageal cancer Neg Hx    Stomach cancer Neg Hx    Rectal cancer Neg Hx   Bryttani's father died from unknown causes in his early 79's. Patients' mother died from diabetes complications at age 54. The patient has 1 sister. Patient denies anyone in her family having breast, ovarian, prostate, or pancreatic cancer.    GYNECOLOGIC HISTORY:  Patient's last menstrual period was 06/08/2007. 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 Her husband, Marlou Sims, works at [employer]. Rylinn has two children, Melodie Spry and Myrtie Atkinson. Melodie Spry lives with her, is 80, and it attending [employer] for a computer based degree. Myrtie Atkinson lives with her, is 61, and recently graduated from [employer] with a degree in Pension scheme manager.  He works for [employer]. Amely has no grandchildren. She attends the [employer].   ADVANCED DIRECTIVES: In the absence of any documents to the contrary her husband, Marlou Sims, is automatically her healthcare power of attorney     HEALTH MAINTENANCE: Social History   Tobacco Use   Smoking status: Never   Smokeless tobacco:  Never  Vaping Use   Vaping status: Never Used  Substance Use Topics   Alcohol use: Not Currently    Alcohol/week: 0.0 standard drinks of alcohol    Comment: Occassionally   Drug use: No    Colonoscopy: April 2022, danis  PAP: January 2022, Nche  Bone density:  2017; -1.6, osteopenic   Allergies  Allergen Reactions   Bee Pollen Itching    Watery eyes and nose running    Current Outpatient Medications  Medication Sig Dispense Refill   alendronate  (FOSAMAX ) 70 MG tablet Take 1 tablet (70 mg total) by mouth every 7 (seven) days. Take with a  full glass of water  on an empty stomach. (Patient not taking: Reported on 11/18/2023) 12 tablet 3   amLODipine  (NORVASC ) 10 MG tablet Take 1 tablet by mouth daily. 90 tablet 2   anastrozole  (ARIMIDEX ) 1 MG tablet TAKE 1 TABLET(1 MG) BY MOUTH DAILY 90 tablet 4   atorvastatin  (LIPITOR) 20 MG tablet Take 1 tablet (20 mg total) by mouth daily. 90 tablet 3   brimonidine  (ALPHAGAN ) 0.15 % ophthalmic solution 1 drop 2 (two) times daily.     brimonidine  (ALPHAGAN ) 0.2 % ophthalmic solution SMARTSIG:In Eye(s)     dorzolamide -timolol  (COSOPT ) 2-0.5 % ophthalmic solution 1 drop 2 (two) times daily.     losartan  (COZAAR ) 50 MG tablet TAKE 1 TABLET(50 MG) BY MOUTH DAILY 90 tablet 3   No current facility-administered medications for this visit.     OBJECTIVE: African-American woman who appears stated age  Vitals:   11/18/23 1146  BP: 122/70  Pulse: (!) 58  Resp: 18  Temp: 97.8 F (36.6 C)  SpO2: 100%       Body mass index is 21.8 kg/m.   Wt Readings from Last 3 Encounters:  11/18/23 127 lb (57.6 kg)  11/18/23 128 lb 2 oz (58.1 kg)  11/14/23 125 lb (56.7 kg)   PE deferred in lieu of counseling   LAB RESULTS:  CMP     Component Value Date/Time   NA 140 10/27/2023 1243   NA 140 10/12/2017 0950   K 3.8 10/27/2023 1243   CL 105 10/27/2023 1243   CO2 29 10/27/2023 1243   GLUCOSE 99 10/27/2023 1243   BUN 10 10/27/2023 1243   BUN 10 10/12/2017 0950   CREATININE 0.81 10/27/2023 1243   CALCIUM  9.7 10/27/2023 1243   PROT 7.3 10/27/2023 1243   PROT 7.5 10/12/2017 0950   ALBUMIN 4.3 10/27/2023 1243   ALBUMIN 4.3 10/12/2017 0950   AST 40 10/27/2023 1243   ALT 36 10/27/2023 1243   ALKPHOS 191 (H) 10/27/2023 1243   BILITOT 0.3 10/27/2023 1243   GFRNONAA >60 10/27/2023 1243   GFRAA >60 12/06/2019 1441   GFRAA >60 09/07/2018 1455   Lab Results  Component Value Date   WBC 3.9 (L) 11/14/2023   NEUTROABS 2.6 10/27/2023   HGB 11.1 (L) 11/14/2023   HCT 32.9 (L) 11/14/2023   MCV 92.7  11/14/2023   PLT 279 11/14/2023    Lab Results  Component Value Date   LABCA2 <4 08/30/2007    No components found for: "LOVFIE332"  Recent Labs  Lab 11/14/23 0659  INR 1.1    Lab Results  Component Value Date   LABCA2 <4 08/30/2007    No results found for: "RJJ884"  No results found for: "ZYS063"  Lab Results  Component Value Date   CAN153 23.9 10/27/2023    Lab Results  Component Value Date   CA2729  27.9 10/27/2023    No components found for: "HGQUANT"  No results found for: "CEA1", "CEA" / No results found for: "CEA1", "CEA"  No results found for: "AFPTUMOR"  No results found for: "CHROMOGRNA"  No results found for: "TOTALPROTELP", "ALBUMINELP", "A1GS", "A2GS", "BETS", "BETA2SER", "GAMS", "MSPIKE", "SPEI" (this displays SPEP labs)  No results found for: "KPAFRELGTCHN", "LAMBDASER", "KAPLAMBRATIO" (kappa/lambda light chains)  No results found for: "HGBA", "HGBA2QUANT", "HGBFQUANT", "HGBSQUAN" (Hemoglobinopathy evaluation)   Lab Results  Component Value Date   LDH 169 08/30/2007    Lab Results  Component Value Date   IRON 96 07/11/2023   TIBC 382 07/11/2023   IRONPCTSAT 25 07/11/2023   (Iron and TIBC)  Lab Results  Component Value Date   FERRITIN 261 07/11/2023    Urinalysis    Component Value Date/Time   LABSPEC 1.015 04/09/2008 1548   PHURINE 7.0 04/09/2008 1548   HGBUR large 04/09/2008 1548   BILIRUBINUR negative 04/09/2008 1548   UROBILINOGEN 0.2 04/09/2008 1548   NITRITE negative 04/09/2008 1548    STUDIES:  MR SHOULDER RIGHT W WO CONTRAST Result Date: 11/18/2023 CLINICAL DATA:  Chronic right shoulder pain. Metastatic breast cancer. Spinal metastases. EXAM: MRI OF THE RIGHT SHOULDER WITHOUT AND WITH CONTRAST TECHNIQUE: Multiplanar, multisequence MR imaging of the right shoulder was performed before and after the administration of intravenous contrast. CONTRAST:  5mL GADAVIST  GADOBUTROL  1 MMOL/ML IV SOLN COMPARISON:  Right  shoulder radiographs 08/03/2023 FINDINGS: Rotator cuff: Moderate to high-grade intermediate T2 signal and thickening of the anterior infraspinatus tendon footprint, high-grade tendinosis and region measuring up to approximately 2 cm in AP dimension. Mild subcortical cystic change within the underlying humeral head. The infraspinatus, subscapularis, and teres minor are intact. Muscles: No rotator cuff muscle atrophy, fatty infiltration, or edema. Biceps long head: Mild to moderate intermediate T2 signal and thickening long head of the biceps tendinosis proximal to the bicipital groove. Acromioclavicular Joint: There are mild degenerative changes of the acromioclavicular joint including joint space narrowing, subchondral marrow edema, and peripheral osteophytosis. Type II acromion. No fluid within the subacromial/subdeltoid bursa. Glenohumeral Joint: Moderate glenohumeral cartilage thinning. Mild-to-moderate glenoid subchondral cystic changes. No glenohumeral joint effusion. Labrum: There is attenuation and degenerative irregularity of the posterosuperior glenoid labrum. Bones: There is an intermediate to decreased T1 and intermediate to increased T2 signal lobular and irregular lesion with mild heterogeneous enhancement that is centered within the junction of the scapular body, spine, and base of the coracoid process (a axial image 11, sagittal image 1, coronal images 8 and 9). This measures up to approximately 2.1 x 1.5 x 2.0 cm (transverse by AP by craniocaudal). This erodes the anterior cortex (axial series 14 images 11 and 12). Additional indeterminate ill-defined decreased T1 and increased T2 signal lesion measuring up to 9 mm within the proximal humeral surgical neck, centered mildly anteriorly (sagittal image 11, axial image 17, coronal image 7). No aggressive feature at this time, and this may represent a benign lesion, however an early metastasis is difficult to exclude. Other: None. IMPRESSION: 1. There is  a 2.1 cm lobular, irregular, and mildly enhancing lesion centered within the junction of the scapular body, spine, and base of the coracoid process with erosion of the anterior cortex. This is highly suspicious for a bone metastasis given the patient's other known bone metastases. No pathologic fracture is seen. 2. Additional indeterminate ill-defined 9 mm lesion within the proximal humeral surgical neck. No aggressive feature at this time, and this may represent a benign lesion, however  an early metastasis is difficult to exclude. 3. Moderate to high-grade anterior infraspinatus tendinosis. 4. Mild to moderate long head of the biceps tendinosis proximal to the bicipital groove. 5. Mild degenerative changes of the acromioclavicular joint. 6. Moderate glenohumeral cartilage thinning. Electronically Signed   By: Bertina Broccoli M.D.   On: 11/18/2023 09:16   MR THORACIC SPINE W WO CONTRAST Result Date: 11/18/2023 CLINICAL DATA:  Cervical radiculopathy, known spine metastases. EXAM: MRI CERVICAL AND THORACIC SPINE WITHOUT AND WITH CONTRAST TECHNIQUE: Multiplanar and multiecho pulse sequences of the cervical spine, to include the craniocervical junction and cervicothoracic junction, and the thoracic spine, were obtained without and with intravenous contrast. CONTRAST:  5mL GADAVIST  GADOBUTROL  1 MMOL/ML IV SOLN COMPARISON:  None Available. FINDINGS: MRI CERVICAL SPINE FINDINGS Alignment: Physiologic. Vertebrae: Multiple infiltrative bone lesions consistent with metastatic disease: *Clivus and left paramedian occipital bone *Left lamina of C2 *Right articular process of C3 *Body of C5 eccentric to the left *C6 body and left articular process *C7 right body, pedicle, and articular process with extraosseous extension upward into the C6-7 foramen. Conceivable right articular process fracture where there is a hypointense line seen on sagittal STIR and T2 weighted imaging. *T1 involving the body and right articular process  with some upward growth likely through pedicular cortex but not clearly impinging on the right C8 nerve root. Cord: No cord impingement or signal abnormality. Posterior Fossa, vertebral arteries, paraspinal tissues: No perispinal inflammation Disc levels: Mild disc space narrowing and bulging seen diffusely. No more than mild facet spurring. MRI THORACIC SPINE FINDINGS Alignment:  Exaggerated thoracic kyphosis.  No listhesis. Vertebrae: Multiple infiltrating bone lesions: *T1 body and right posterior elements *T2 right articular process with possible early extra cortical extension but no nerve root impingement. *T3 body which involves more than 50% of the volume. *T4 upper and left paramedian body *T5 right transverse process and rib head. *T6 left rib head and neck and right body *T7 multifocal body and right lamina *T8 lower body *Multifocal in the T9 body, left pedicle, and left lamina *T10 body, left pedicle, and right articular process *T11 bilateral body *T12 body and left pedicle *L1 body and left posterior elements *L2 body and spinous process. No extraosseous tumor extension or pathologic fracture. Cord:  Normal signal and morphology Paraspinal and other soft tissues: Widespread hepatic metastatic disease. Disc levels: No significant degenerative change IMPRESSION: Widespread osseous metastatic disease in the cervical and thoracic spine with affected levels listed above. Pertinent to the right upper extremity symptoms, a C7 right-sided deposit extends into the soft tissues and affects the right C6-7 foramen. Pathologic fracture on the right at C7 is conceivable and noncontrast CT may be complementary. No cord impingement seen throughout the covered spine. Electronically Signed   By: Ronnette Coke M.D.   On: 11/18/2023 09:14   MR CERVICAL SPINE W WO CONTRAST Result Date: 11/18/2023 CLINICAL DATA:  Cervical radiculopathy, known spine metastases. EXAM: MRI CERVICAL AND THORACIC SPINE WITHOUT AND WITH  CONTRAST TECHNIQUE: Multiplanar and multiecho pulse sequences of the cervical spine, to include the craniocervical junction and cervicothoracic junction, and the thoracic spine, were obtained without and with intravenous contrast. CONTRAST:  5mL GADAVIST  GADOBUTROL  1 MMOL/ML IV SOLN COMPARISON:  None Available. FINDINGS: MRI CERVICAL SPINE FINDINGS Alignment: Physiologic. Vertebrae: Multiple infiltrative bone lesions consistent with metastatic disease: *Clivus and left paramedian occipital bone *Left lamina of C2 *Right articular process of C3 *Body of C5 eccentric to the left *C6 body and left articular process *C7  right body, pedicle, and articular process with extraosseous extension upward into the C6-7 foramen. Conceivable right articular process fracture where there is a hypointense line seen on sagittal STIR and T2 weighted imaging. *T1 involving the body and right articular process with some upward growth likely through pedicular cortex but not clearly impinging on the right C8 nerve root. Cord: No cord impingement or signal abnormality. Posterior Fossa, vertebral arteries, paraspinal tissues: No perispinal inflammation Disc levels: Mild disc space narrowing and bulging seen diffusely. No more than mild facet spurring. MRI THORACIC SPINE FINDINGS Alignment:  Exaggerated thoracic kyphosis.  No listhesis. Vertebrae: Multiple infiltrating bone lesions: *T1 body and right posterior elements *T2 right articular process with possible early extra cortical extension but no nerve root impingement. *T3 body which involves more than 50% of the volume. *T4 upper and left paramedian body *T5 right transverse process and rib head. *T6 left rib head and neck and right body *T7 multifocal body and right lamina *T8 lower body *Multifocal in the T9 body, left pedicle, and left lamina *T10 body, left pedicle, and right articular process *T11 bilateral body *T12 body and left pedicle *L1 body and left posterior elements *L2 body  and spinous process. No extraosseous tumor extension or pathologic fracture. Cord:  Normal signal and morphology Paraspinal and other soft tissues: Widespread hepatic metastatic disease. Disc levels: No significant degenerative change IMPRESSION: Widespread osseous metastatic disease in the cervical and thoracic spine with affected levels listed above. Pertinent to the right upper extremity symptoms, a C7 right-sided deposit extends into the soft tissues and affects the right C6-7 foramen. Pathologic fracture on the right at C7 is conceivable and noncontrast CT may be complementary. No cord impingement seen throughout the covered spine. Electronically Signed   By: Ronnette Coke M.D.   On: 11/18/2023 09:14   US  CORE BIOPSY (SOFT TISSUE) Result Date: 11/15/2023 CLINICAL DATA:  Multiple liver lesions, multiple osseous lesions including expansile soft tissue left rib lesion EXAM: ULTRASOUND-GUIDED LEFT RIB SOFT TISSUE MASS CORE BIOPSY TECHNIQUE: survey ultrasound of the left chest wall was performed in the left rib soft tissue mass was identified corresponding to CT findings. An appropriate skin entry site was identified and marked. Region prepped with chlorhexidine , draped in usual sterile fashion, infiltrated locally with 1% lidocaine . Intravenous Fentanyl  25mcg and Versed  1mg  were administered by RN during a total moderate (conscious) sedation time of 11 minutes; the patient's level of consciousness and physiological / cardiorespiratory status were monitored continuously by radiology RN under my direct supervision. Under real-time ultrasound guidance, a 17 gauge trocar needle advanced to the margin of the lesion. Multiple 18 gauge core biopsy samples were obtained, submitted to surgical pathology. The guide needle was removed. Postprocedure scans show no hemorrhage or other apparent complication. COMPLICATIONS: COMPLICATIONS none IMPRESSION: 1. Technically successful ultrasound-guided left soft tissue rib lesion  core biopsy. Electronically Signed   By: Nicoletta Barrier M.D.   On: 11/15/2023 07:34   CT CHEST ABDOMEN PELVIS W CONTRAST Result Date: 10/26/2023 CLINICAL DATA:  Metastatic disease evaluation. Left lower lobe mass history of left breast cancer EXAM: CT CHEST, ABDOMEN, AND PELVIS WITH CONTRAST TECHNIQUE: Multidetector CT imaging of the chest, abdomen and pelvis was performed following the standard protocol during bolus administration of intravenous contrast. RADIATION DOSE REDUCTION: This exam was performed according to the departmental dose-optimization program which includes automated exposure control, adjustment of the mA and/or kV according to patient size and/or use of iterative reconstruction technique. CONTRAST:  ISOVUE -300 IOPAMIDOL  (ISOVUE -300)  INJECTION 61% COMPARISON:  Chest x-ray Dec 17, 2023, CT chest September 14, 2018, CT abdomen October 15, 2015 FINDINGS: CT CHEST FINDINGS Cardiovascular: No significant vascular findings. Normal heart size. No pericardial effusion. No significant coronary artery calcifications Mediastinum/Nodes: No enlarged mediastinal, hilar, or axillary lymph nodes. Thyroid  gland, trachea, and esophagus demonstrate no significant findings. Lungs/Pleura: Lungs are clear. No pleural effusion or pneumothorax. No pulmonary nodules 3.8 by 5.5 by 5 cm left anterior chest wall mass along the mid axillary line with rib involvement and destruction and low-attenuation changes of the center indicating necrotic mass correlates with a neoplastic lesion likely metastatic disease. Musculoskeletal: Rib lesion as described above CT ABDOMEN PELVIS FINDINGS Hepatobiliary: Liver is enlarged, inhomogeneous with multiple enhancing lesions throughout the right and left lobe of the liver consistent with extensive numerous metastatic liver lesions. Liver appears completely replaced by metastatic disease. Gallbladder not clearly identified without biliary dilatation. Pancreas: Unremarkable. No pancreatic  ductal dilatation or surrounding inflammatory changes. Spleen: Small spleen without lesions Adrenals/Urinary Tract: Adrenal glands are unremarkable. Kidneys are normal, without renal calculi, focal lesion, or hydronephrosis. Bladder is unremarkable. Stomach/Bowel: Stomach is within normal limits. Appendix appears normal. No evidence of bowel wall thickening, distention, or inflammatory changes. Vascular/Lymphatic: Aortic atherosclerosis. No enlarged abdominal or pelvic lymph nodes. Reproductive: Uterus and bilateral adnexa are unremarkable. Other: No abdominal wall hernia or abnormality. No abdominopelvic ascites. Musculoskeletal: Lytic metastatic bone destructive lesion involving the right anterior superior iliac crest measuring 3.4 x 2.4 cm. Similar lesion on the left iliac bone measuring 3.5 x 2.4 cm. Healed fracture of the right proximal pubic ramus right inferior pubic ramus demonstrates a lytic destructive bone lesion measuring 2.2 x 1.7 cm 1.8 x 1 cm lytic bone lesion involving the left iliac bone of the level of the left sacroiliac joint Suggestive of lytic bone changes involving the L4 vertebral body as well as the upper thoracic vertebral bodies T1-T2. IMPRESSION: *3.8 x 5.5 x 5 cm left anterior chest wall mass along the mid axillary line with rib involvement and destruction and low-attenuation changes of the center indicating necrotic mass correlates with a neoplastic lesion likely metastatic disease. *Liver is enlarged, inhomogeneous with multiple enhancing lesions throughout the right and left lobe of the liver consistent with extensive numerous metastatic liver lesions. Liver appears completely replaced by metastatic disease. 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 *Suggestive of lytic bone changes involving the upper thoracic vertebral bodies T1-T2. *Aortic atherosclerosis.  Electronically Signed   By: Fredrich Jefferson M.D.   On: 10/26/2023 15:14     ELIGIBLE FOR AVAILABLE RESEARCH PROTOCOL: No  ASSESSMENT: 58 y.o. [place], [place] woman  (1) history of left-sided ductal carcinoma in situ 2004  (a) s/p left mastectomy with TRAM reconstruction  (b) status post tamoxifen x3 years  (2) left breast upper outer quadrant biopsy 08/30/2018 shows a clinical T1c N0 invasive ductal carcinoma, grade 3, estrogen receptor strongly positive, progesterone receptor negative, with HER-2 amplification, and and MIB-1 of 15%.   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    (b) chemotherapy discontinued after 4 cycles because of intercurrent eye surgery  (  c) continued trastuzumab  and pertuzumab  to complete a year (last dose 09/06/2019)  (d) echocardiogram on 01/23/2019 that shows well preserved EF of 60-65%  (e) echocardiogram on 04/23/2019 shows EF of 60-65%  (f) echocardiogram 08/02/2019 shows an ejection fraction in the 60-65% range  (4) left lumpectomy 02/20/2019 showed a residual  ypT1c NX invasive ductal carcinoma, grade 2 with negative margins.    (5) adjuvant radiation: Radiation Treatment Dates: 04/12/2019 through 05/28/2019 Site Technique Total Dose (Gy) Dose per Fx (Gy) Completed Fx Beam Energies  Breast: CW_Lt 3D 50.4/50.4 1.8 28/28 6X, 10X  Breast: CW_Lt_SCV_PAB 3D 50.4/50.4 1.8 28/28 6X, 10X  Breast: CW_Lt_Bst Electron 10/10 2 5/5 6X, 10X   (6) anastrozole  started 06/26/2019  (a) DEXA scan at [facility] 12/24/2015 found a T score of -1.6 DEXA scan from mass June 2023 showed T score of -1.4 at L1-L2  (7) genetics testing 02/03/2019 through the Common  Hereditary Cancers Panel offered by [facility] found no deleterious mutations in APC, ATM, AXIN2, BARD1, BMPR1A, BRCA1, BRCA2, BRIP1, CDH1, CDKN2A (p14ARF), CDKN2A (p16INK4a), CKD4, CHEK2, CTNNA1, DICER1, EPCAM (Deletion/duplication testing only), GREM1 (promoter region deletion/duplication testing only), KIT, MEN1, MLH1, MSH2, MSH3, MSH6, MUTYH, NBN, NF1, NHTL1, PALB2, PDGFRA, PMS2, POLD1, POLE, PTEN, RAD50, RAD51C, RAD51D, RNF43, SDHB, SDHC, SDHD, SMAD4, SMARCA4. STK11, TP53, TSC1, TSC2, and VHL.  The following genes were evaluated for sequence changes only: SDHA and HOXB13 c.251G>A variant only.  (8) She had a CXR for unintentional weight loss,  this showed possible left lung pleural based mass and posterior right eighth rib met lesion. CT showed 3.8 x 5.5 x 5 cm left anterior chest wall mass along the mid axillary line with rib involvement and destruction and low-attenuation changes of the center indicating necrotic mass correlates with a neoplastic lesion likely metastatic disease. Liver is enlarged, inhomogeneous with multiple enhancing lesions throughout the right and left lobe of the liver consistent with extensive numerous metastatic liver lesions. Liver appears completely replaced by metastatic disease. Lytic metastatic bone lesions involving the right anterior superior iliac crest, left iliac bone, right inferior pubic ramus, left iliac bone, and L4 vertebral body. Suggestive of lytic bone changes involving the upper thoracic vertebral bodies T1-T2.  PLAN:  Assessment and Plan Assessment & Plan Metastatic Breast Cancer  HER2 positive breast cancer HER2 positive and estrogen receptor positive breast cancer confirmed. Standard treatment involves chemotherapy and antibodies, docetaxel  with HP. Previous chemotherapy well-tolerated. Neuropathy resolved. Discussed neuropathy concerns with docetaxel .  - Order docetaxel  with herceptin  and perjeta , intent of treatment is palliative. - Administer  chemotherapy every 21 days for six cycles. - Transition to antibody maintenance therapy post-chemotherapy. - Monitor for side effects: neuropathy, fatigue, nausea, diarrhea. - Monitor blood counts, liver and kidney function. - Order echocardiogram to monitor cardiac function. - Schedule port placement before second chemotherapy cycle. - Coordinate with radiation oncologist to adjust radiation schedule if necessary. - Plan follow-up scan after two or three treatment rounds to assess response. - will start zometa every 12 weeks, will need dental clearance.  Time spent: 30 min   Murleen Arms, MD  Medical Oncology and Hematology [facility] [address] [place], [place] [address] Tel. [phone]    Fax. [phone]  *Total Encounter Time as defined by the Centers for Medicare and Medicaid Services includes, in addition to the face-to-face time of a patient visit (documented in the note above) non-face-to-face time: obtaining and reviewing outside history, ordering and reviewing medications, tests or procedures, care coordination (communications with other  health care professionals or caregivers) and documentation in the medical record.

## 2023-11-20 ENCOUNTER — Other Ambulatory Visit: Payer: Self-pay

## 2023-11-21 ENCOUNTER — Ambulatory Visit: Admitting: Nurse Practitioner

## 2023-11-21 ENCOUNTER — Encounter: Payer: Self-pay | Admitting: *Deleted

## 2023-11-21 ENCOUNTER — Other Ambulatory Visit: Payer: Self-pay | Admitting: Pharmacist

## 2023-11-21 ENCOUNTER — Other Ambulatory Visit (HOSPITAL_BASED_OUTPATIENT_CLINIC_OR_DEPARTMENT_OTHER)

## 2023-11-21 ENCOUNTER — Encounter: Payer: Self-pay | Admitting: Hematology and Oncology

## 2023-11-21 DIAGNOSIS — Z170 Estrogen receptor positive status [ER+]: Secondary | ICD-10-CM | POA: Diagnosis not present

## 2023-11-21 DIAGNOSIS — C50412 Malignant neoplasm of upper-outer quadrant of left female breast: Secondary | ICD-10-CM | POA: Diagnosis not present

## 2023-11-21 DIAGNOSIS — Z510 Encounter for antineoplastic radiation therapy: Secondary | ICD-10-CM | POA: Diagnosis not present

## 2023-11-21 DIAGNOSIS — C7951 Secondary malignant neoplasm of bone: Secondary | ICD-10-CM | POA: Diagnosis not present

## 2023-11-21 NOTE — Addendum Note (Signed)
 Encounter addended by: Colie Dawes, MD on: 11/21/2023 9:30 AM  Actions taken: Clinical Note Signed

## 2023-11-22 ENCOUNTER — Other Ambulatory Visit: Payer: Self-pay

## 2023-11-22 ENCOUNTER — Ambulatory Visit
Admission: RE | Admit: 2023-11-22 | Discharge: 2023-11-22 | Discharge status: Home / Self Care | Source: Ambulatory Visit | Attending: Radiation Oncology | Admitting: Radiation Oncology

## 2023-11-22 DIAGNOSIS — Z510 Encounter for antineoplastic radiation therapy: Secondary | ICD-10-CM | POA: Diagnosis not present

## 2023-11-22 DIAGNOSIS — C7951 Secondary malignant neoplasm of bone: Secondary | ICD-10-CM | POA: Diagnosis not present

## 2023-11-22 DIAGNOSIS — C50412 Malignant neoplasm of upper-outer quadrant of left female breast: Secondary | ICD-10-CM | POA: Diagnosis not present

## 2023-11-22 DIAGNOSIS — Z170 Estrogen receptor positive status [ER+]: Secondary | ICD-10-CM | POA: Diagnosis not present

## 2023-11-22 LAB — RAD ONC ARIA SESSION SUMMARY
Course Elapsed Days: 0
Plan Fractions Treated to Date: 1
Plan Fractions Treated to Date: 1
Plan Prescribed Dose Per Fraction: 4 Gy
Plan Prescribed Dose Per Fraction: 4 Gy
Plan Total Fractions Prescribed: 5
Plan Total Fractions Prescribed: 5
Plan Total Prescribed Dose: 20 Gy
Plan Total Prescribed Dose: 20 Gy
Reference Point Dosage Given to Date: 4 Gy
Reference Point Dosage Given to Date: 4 Gy
Reference Point Session Dosage Given: 4 Gy
Reference Point Session Dosage Given: 4 Gy
Session Number: 1

## 2023-11-23 ENCOUNTER — Ambulatory Visit
Admission: RE | Admit: 2023-11-23 | Discharge: 2023-11-23 | Disposition: A | Discharge status: Home / Self Care | Source: Ambulatory Visit | Attending: Radiation Oncology | Admitting: Radiation Oncology

## 2023-11-23 ENCOUNTER — Other Ambulatory Visit: Payer: Self-pay

## 2023-11-23 ENCOUNTER — Other Ambulatory Visit: Payer: Self-pay | Admitting: *Deleted

## 2023-11-23 DIAGNOSIS — C7951 Secondary malignant neoplasm of bone: Secondary | ICD-10-CM | POA: Diagnosis not present

## 2023-11-23 DIAGNOSIS — Z170 Estrogen receptor positive status [ER+]: Secondary | ICD-10-CM | POA: Diagnosis not present

## 2023-11-23 DIAGNOSIS — C50412 Malignant neoplasm of upper-outer quadrant of left female breast: Secondary | ICD-10-CM | POA: Diagnosis not present

## 2023-11-23 DIAGNOSIS — Z510 Encounter for antineoplastic radiation therapy: Secondary | ICD-10-CM | POA: Diagnosis not present

## 2023-11-23 LAB — RAD ONC ARIA SESSION SUMMARY
Course Elapsed Days: 1
Plan Fractions Treated to Date: 2
Plan Fractions Treated to Date: 2
Plan Prescribed Dose Per Fraction: 4 Gy
Plan Prescribed Dose Per Fraction: 4 Gy
Plan Total Fractions Prescribed: 5
Plan Total Fractions Prescribed: 5
Plan Total Prescribed Dose: 20 Gy
Plan Total Prescribed Dose: 20 Gy
Reference Point Dosage Given to Date: 8 Gy
Reference Point Dosage Given to Date: 8 Gy
Reference Point Session Dosage Given: 4 Gy
Reference Point Session Dosage Given: 4 Gy
Session Number: 2

## 2023-11-24 ENCOUNTER — Telehealth: Payer: Self-pay | Admitting: *Deleted

## 2023-11-24 ENCOUNTER — Other Ambulatory Visit: Payer: Self-pay | Admitting: Hematology and Oncology

## 2023-11-24 ENCOUNTER — Other Ambulatory Visit: Payer: Self-pay | Admitting: Student

## 2023-11-24 ENCOUNTER — Ambulatory Visit

## 2023-11-24 ENCOUNTER — Other Ambulatory Visit: Payer: Self-pay | Admitting: Radiology

## 2023-11-24 NOTE — Telephone Encounter (Signed)
 This RN called pt per MD recommendation to stop fosamax /alendronate  at this time due to plan to use zometa with her chemotherapy.  Obtained identified VM - message left per above as well as request to return call to verify communication.

## 2023-11-24 NOTE — H&P (Signed)
 Chief Complaint: Recurrent metastatic left breast cancer; referred  for Port-A-Cath placement to assist with treatment  Referring Provider(s): Iruku,P  Supervising Physician: Fernando Hoyer  Patient Status: [facility] - Out-pt  History of Present Illness: Paige Foster is a 58 y.o. female with past medical history significant for anemia, colon polyps, hypertension, renal artery stenosis who presents now with recurrent metastatic left breast cancer(initial diagnosis 2004). She is scheduled today for Port-A-Cath placement to assist with treatment.  She is known to IR team from left rib soft tissue mass biopsy on 11/15/23.      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    Port-A-Cath in place 09/28/2018   Recurrent breast cancer, left [facility]) oncologist-- dr Charolett Copes    dx 2004, noninvasive Stage 0 ----s/p left mastectomy w/ tram flap construction (and right breast reduction), taken Tamoxifen for 3 yrs;   08-30-2018 recurrent left cancer , Grade III,  cT1c,  ER positive, PR negative, HER-2 positive, invasive ductal carcinoma-- neoadjuvant chemo to start 09-26-2018   Renal artery stenosis (HCC)    mild right external renal artery stenosis per duplex in epic 08-09-2013   Wears glasses     Past Surgical History:  Procedure Laterality Date   BREAST LUMPECTOMY WITH RADIOACTIVE SEED LOCALIZATION Left 02/20/2019   Procedure: LEFT BREAST LUMPECTOMY WITH RADIOACTIVE SEED LOCALIZATION;  Surgeon: Enid Harry, MD;  Location: [facility] OR;  Service: General;  Laterality: Left;   BREAST SURGERY Left    Transflap   COLONOSCOPY     COLONOSCOPY     MASTECTOMY Left 2004   w/  TRAM flap construction and right breast augmentation with abdominoplasy   PARS PLANA VITRECTOMY Left 12/18/2018   Procedure: PARS PLANA  VITRECTOMY WITH 25 GAUGE, ENDOLASER;  Surgeon: Jearline Minder, MD;  Location: [facility] OR;  Service: Ophthalmology;  Laterality: Left;   PORTACATH PLACEMENT N/A 09/25/2018   Procedure: INSERTION PORT-A-CATH WITH ULTRASOUND;  Surgeon: Enid Harry, MD;  Location: [facility] ORS;  Service: General;  Laterality: N/A;   TUBAL LIGATION Bilateral yrs ago    Allergies: Bee pollen  Medications: Prior to Admission medications   Medication Sig Start Date End Date Taking? Authorizing Provider  alendronate  (FOSAMAX ) 70 MG tablet Take 1 tablet (70 mg total) by mouth every 7 (seven) days. Take with a full glass of water  on an empty stomach. Patient not taking: Reported on 11/18/2023 09/30/23   Nche, Connye Delaine, NP  amLODipine  (NORVASC ) 10 MG tablet Take 1 tablet by mouth daily. 09/30/23   Nche, Connye Delaine, NP  anastrozole  (ARIMIDEX ) 1 MG tablet TAKE 1 TABLET(1 MG) BY MOUTH DAILY 09/23/23   Iruku, Praveena, MD  atorvastatin  (LIPITOR) 20 MG tablet Take 1 tablet (20 mg total) by mouth daily. 10/28/22   Nche, Connye Delaine, NP  brimonidine  (ALPHAGAN ) 0.15 % ophthalmic solution 1 drop 2 (two) times daily. 06/06/23   [provider]  brimonidine  (ALPHAGAN ) 0.2 % ophthalmic solution SMARTSIG:In Eye(s) 05/18/23   [provider]  dorzolamide -timolol  (COSOPT ) 2-0.5 % ophthalmic solution 1 drop 2 (two) times daily. 06/06/23   [provider]  losartan  (COZAAR ) 50 MG tablet TAKE 1 TABLET(50 MG) BY MOUTH DAILY 09/30/23   Nche, Connye Delaine, NP  prochlorperazine  (COMPAZINE ) 10 MG tablet Take  1 tablet (10 mg total) by mouth every 6 (six) hours as needed (Nausea or vomiting). 09/18/18 01/11/19  Magrinat, Rozella Cornfield, MD     Family History  Problem Relation Age of Onset   Diabetes Mother    Hypertension Mother    Kidney disease Father    Colon cancer Neg Hx    Colon polyps Neg Hx    Gallbladder disease Neg Hx    Heart disease Neg Hx    Esophageal cancer Neg Hx    Stomach cancer Neg Hx    Rectal cancer  Neg Hx     Social History   Socioeconomic History   Marital status: Married    Spouse name: Not on file   Number of children: 2   Years of education: Not on file   Highest education level: Not on file  Occupational History   Occupation: [employer]  Tobacco Use   Smoking status: Never   Smokeless tobacco: Never  Vaping Use   Vaping status: Never Used  Substance and Sexual Activity   Alcohol use: Not Currently    Alcohol/week: 0.0 standard drinks of alcohol    Comment: Occassionally   Drug use: No   Sexual activity: Not on file  Other Topics Concern   Not on file  Social History Narrative   Not on file   Social Drivers of Health   Financial Resource Strain: Low Risk  (10/27/2022)   Overall Financial Resource Strain (CARDIA)    Difficulty of Paying Living Expenses: Not hard at all  Food Insecurity: No Food Insecurity (11/04/2023)   Hunger Vital Sign    Worried About Running Out of Food in the Last Year: Never true    Ran Out of Food in the Last Year: Never true  Transportation Needs: No Transportation Needs (11/04/2023)   PRAPARE - Administrator, Civil Service (Medical): No    Lack of Transportation (Non-Medical): No  Physical Activity: Insufficiently Active (10/27/2022)   Exercise Vital Sign    Days of Exercise per Week: 7 days    Minutes of Exercise per Session: 20 min  Stress: No Stress Concern Present (10/27/2022)   [employer] of Occupational Health - Occupational Stress Questionnaire    Feeling of Stress : Not at all  Social Connections: Moderately Isolated (10/27/2022)   Social Connection and Isolation Panel [NHANES]    Frequency of Communication with Friends and Family: More than three times a week    Frequency of Social Gatherings with Friends and Family: More than three times a week    Attends Religious Services: Never    Database administrator or Organizations: No    Attends Banker Meetings: Never    Marital Status: Married        Review of Systems denies fever, headache, chest pain, dyspnea, cough, abdominal pain, nausea, vomiting or bleeding.  She does have right shoulder and left hip discomfort  Vital Signs: Vitals:   11/25/23 0730  BP: 104/63  Pulse: 62  Resp: 16  Temp: (!) 97.5 F (36.4 C)  SpO2: 99%    LMP 06/08/2007 Comment: Just stopped having period  Advance Care Plan: No documents on file    Physical Exam awake, alert.  Chest clear to auscultation bilaterally.  Heart with regular rate and rhythm,? Murmur; abdomen soft, positive bowel sounds, nontender.  No lower extremity edema.  Imaging: MR SHOULDER RIGHT W WO CONTRAST Result Date: 11/18/2023 CLINICAL DATA:  Chronic right shoulder pain. Metastatic breast cancer.  Spinal metastases. EXAM: MRI OF THE RIGHT SHOULDER WITHOUT AND WITH CONTRAST TECHNIQUE: Multiplanar, multisequence MR imaging of the right shoulder was performed before and after the administration of intravenous contrast. CONTRAST:  5mL GADAVIST  GADOBUTROL  1 MMOL/ML IV SOLN COMPARISON:  Right shoulder radiographs 08/03/2023 FINDINGS: Rotator cuff: Moderate to high-grade intermediate T2 signal and thickening of the anterior infraspinatus tendon footprint, high-grade tendinosis and region measuring up to approximately 2 cm in AP dimension. Mild subcortical cystic change within the underlying humeral head. The infraspinatus, subscapularis, and teres minor are intact. Muscles: No rotator cuff muscle atrophy, fatty infiltration, or edema. Biceps long head: Mild to moderate intermediate T2 signal and thickening long head of the biceps tendinosis proximal to the bicipital groove. Acromioclavicular Joint: There are mild degenerative changes of the acromioclavicular joint including joint space narrowing, subchondral marrow edema, and peripheral osteophytosis. Type II acromion. No fluid within the subacromial/subdeltoid bursa. Glenohumeral Joint: Moderate glenohumeral cartilage thinning.  Mild-to-moderate glenoid subchondral cystic changes. No glenohumeral joint effusion. Labrum: There is attenuation and degenerative irregularity of the posterosuperior glenoid labrum. Bones: There is an intermediate to decreased T1 and intermediate to increased T2 signal lobular and irregular lesion with mild heterogeneous enhancement that is centered within the junction of the scapular body, spine, and base of the coracoid process (a axial image 11, sagittal image 1, coronal images 8 and 9). This measures up to approximately 2.1 x 1.5 x 2.0 cm (transverse by AP by craniocaudal). This erodes the anterior cortex (axial series 14 images 11 and 12). Additional indeterminate ill-defined decreased T1 and increased T2 signal lesion measuring up to 9 mm within the proximal humeral surgical neck, centered mildly anteriorly (sagittal image 11, axial image 17, coronal image 7). No aggressive feature at this time, and this may represent a benign lesion, however an early metastasis is difficult to exclude. Other: None. IMPRESSION: 1. There is a 2.1 cm lobular, irregular, and mildly enhancing lesion centered within the junction of the scapular body, spine, and base of the coracoid process with erosion of the anterior cortex. This is highly suspicious for a bone metastasis given the patient's other known bone metastases. No pathologic fracture is seen. 2. Additional indeterminate ill-defined 9 mm lesion within the proximal humeral surgical neck. No aggressive feature at this time, and this may represent a benign lesion, however an early metastasis is difficult to exclude. 3. Moderate to high-grade anterior infraspinatus tendinosis. 4. Mild to moderate long head of the biceps tendinosis proximal to the bicipital groove. 5. Mild degenerative changes of the acromioclavicular joint. 6. Moderate glenohumeral cartilage thinning. Electronically Signed   By: Bertina Broccoli M.D.   On: 11/18/2023 09:16   MR THORACIC SPINE W WO  CONTRAST Result Date: 11/18/2023 CLINICAL DATA:  Cervical radiculopathy, known spine metastases. EXAM: MRI CERVICAL AND THORACIC SPINE WITHOUT AND WITH CONTRAST TECHNIQUE: Multiplanar and multiecho pulse sequences of the cervical spine, to include the craniocervical junction and cervicothoracic junction, and the thoracic spine, were obtained without and with intravenous contrast. CONTRAST:  5mL GADAVIST  GADOBUTROL  1 MMOL/ML IV SOLN COMPARISON:  None Available. FINDINGS: MRI CERVICAL SPINE FINDINGS Alignment: Physiologic. Vertebrae: Multiple infiltrative bone lesions consistent with metastatic disease: *Clivus and left paramedian occipital bone *Left lamina of C2 *Right articular process of C3 *Body of C5 eccentric to the left *C6 body and left articular process *C7 right body, pedicle, and articular process with extraosseous extension upward into the C6-7 foramen. Conceivable right articular process fracture where there is a hypointense line seen on sagittal  STIR and T2 weighted imaging. *T1 involving the body and right articular process with some upward growth likely through pedicular cortex but not clearly impinging on the right C8 nerve root. Cord: No cord impingement or signal abnormality. Posterior Fossa, vertebral arteries, paraspinal tissues: No perispinal inflammation Disc levels: Mild disc space narrowing and bulging seen diffusely. No more than mild facet spurring. MRI THORACIC SPINE FINDINGS Alignment:  Exaggerated thoracic kyphosis.  No listhesis. Vertebrae: Multiple infiltrating bone lesions: *T1 body and right posterior elements *T2 right articular process with possible early extra cortical extension but no nerve root impingement. *T3 body which involves more than 50% of the volume. *T4 upper and left paramedian body *T5 right transverse process and rib head. *T6 left rib head and neck and right body *T7 multifocal body and right lamina *T8 lower body *Multifocal in the T9 body, left pedicle, and left  lamina *T10 body, left pedicle, and right articular process *T11 bilateral body *T12 body and left pedicle *L1 body and left posterior elements *L2 body and spinous process. No extraosseous tumor extension or pathologic fracture. Cord:  Normal signal and morphology Paraspinal and other soft tissues: Widespread hepatic metastatic disease. Disc levels: No significant degenerative change IMPRESSION: Widespread osseous metastatic disease in the cervical and thoracic spine with affected levels listed above. Pertinent to the right upper extremity symptoms, a C7 right-sided deposit extends into the soft tissues and affects the right C6-7 foramen. Pathologic fracture on the right at C7 is conceivable and noncontrast CT may be complementary. No cord impingement seen throughout the covered spine. Electronically Signed   By: Ronnette Coke M.D.   On: 11/18/2023 09:14   MR CERVICAL SPINE W WO CONTRAST Result Date: 11/18/2023 CLINICAL DATA:  Cervical radiculopathy, known spine metastases. EXAM: MRI CERVICAL AND THORACIC SPINE WITHOUT AND WITH CONTRAST TECHNIQUE: Multiplanar and multiecho pulse sequences of the cervical spine, to include the craniocervical junction and cervicothoracic junction, and the thoracic spine, were obtained without and with intravenous contrast. CONTRAST:  5mL GADAVIST  GADOBUTROL  1 MMOL/ML IV SOLN COMPARISON:  None Available. FINDINGS: MRI CERVICAL SPINE FINDINGS Alignment: Physiologic. Vertebrae: Multiple infiltrative bone lesions consistent with metastatic disease: *Clivus and left paramedian occipital bone *Left lamina of C2 *Right articular process of C3 *Body of C5 eccentric to the left *C6 body and left articular process *C7 right body, pedicle, and articular process with extraosseous extension upward into the C6-7 foramen. Conceivable right articular process fracture where there is a hypointense line seen on sagittal STIR and T2 weighted imaging. *T1 involving the body and right articular  process with some upward growth likely through pedicular cortex but not clearly impinging on the right C8 nerve root. Cord: No cord impingement or signal abnormality. Posterior Fossa, vertebral arteries, paraspinal tissues: No perispinal inflammation Disc levels: Mild disc space narrowing and bulging seen diffusely. No more than mild facet spurring. MRI THORACIC SPINE FINDINGS Alignment:  Exaggerated thoracic kyphosis.  No listhesis. Vertebrae: Multiple infiltrating bone lesions: *T1 body and right posterior elements *T2 right articular process with possible early extra cortical extension but no nerve root impingement. *T3 body which involves more than 50% of the volume. *T4 upper and left paramedian body *T5 right transverse process and rib head. *T6 left rib head and neck and right body *T7 multifocal body and right lamina *T8 lower body *Multifocal in the T9 body, left pedicle, and left lamina *T10 body, left pedicle, and right articular process *T11 bilateral body *T12 body and left pedicle *L1 body and left  posterior elements *L2 body and spinous process. No extraosseous tumor extension or pathologic fracture. Cord:  Normal signal and morphology Paraspinal and other soft tissues: Widespread hepatic metastatic disease. Disc levels: No significant degenerative change IMPRESSION: Widespread osseous metastatic disease in the cervical and thoracic spine with affected levels listed above. Pertinent to the right upper extremity symptoms, a C7 right-sided deposit extends into the soft tissues and affects the right C6-7 foramen. Pathologic fracture on the right at C7 is conceivable and noncontrast CT may be complementary. No cord impingement seen throughout the covered spine. Electronically Signed   By: Ronnette Coke M.D.   On: 11/18/2023 09:14   US  CORE BIOPSY (SOFT TISSUE) Result Date: 11/15/2023 CLINICAL DATA:  Multiple liver lesions, multiple osseous lesions including expansile soft tissue left rib lesion EXAM:  ULTRASOUND-GUIDED LEFT RIB SOFT TISSUE MASS CORE BIOPSY TECHNIQUE: survey ultrasound of the left chest wall was performed in the left rib soft tissue mass was identified corresponding to CT findings. An appropriate skin entry site was identified and marked. Region prepped with chlorhexidine , draped in usual sterile fashion, infiltrated locally with 1% lidocaine . Intravenous Fentanyl  25mcg and Versed  1mg  were administered by RN during a total moderate (conscious) sedation time of 11 minutes; the patient's level of consciousness and physiological / cardiorespiratory status were monitored continuously by radiology RN under my direct supervision. Under real-time ultrasound guidance, a 17 gauge trocar needle advanced to the margin of the lesion. Multiple 18 gauge core biopsy samples were obtained, submitted to surgical pathology. The guide needle was removed. Postprocedure scans show no hemorrhage or other apparent complication. COMPLICATIONS: COMPLICATIONS none IMPRESSION: 1. Technically successful ultrasound-guided left soft tissue rib lesion core biopsy. Electronically Signed   By: Nicoletta Barrier M.D.   On: 11/15/2023 07:34   CT CHEST ABDOMEN PELVIS W CONTRAST Result Date: 10/26/2023 CLINICAL DATA:  Metastatic disease evaluation. Left lower lobe mass history of left breast cancer EXAM: CT CHEST, ABDOMEN, AND PELVIS WITH CONTRAST TECHNIQUE: Multidetector CT imaging of the chest, abdomen and pelvis was performed following the standard protocol during bolus administration of intravenous contrast. RADIATION DOSE REDUCTION: This exam was performed according to the departmental dose-optimization program which includes automated exposure control, adjustment of the mA and/or kV according to patient size and/or use of iterative reconstruction technique. CONTRAST:  100mL ISOVUE -300 IOPAMIDOL  (ISOVUE -300) INJECTION 61% COMPARISON:  Chest x-ray Dec 17, 2023, CT chest September 14, 2018, CT abdomen October 15, 2015 FINDINGS: CT CHEST  FINDINGS Cardiovascular: No significant vascular findings. Normal heart size. No pericardial effusion. No significant coronary artery calcifications Mediastinum/Nodes: No enlarged mediastinal, hilar, or axillary lymph nodes. Thyroid  gland, trachea, and esophagus demonstrate no significant findings. Lungs/Pleura: Lungs are clear. No pleural effusion or pneumothorax. No pulmonary nodules 3.8 by 5.5 by 5 cm left anterior chest wall mass along the mid axillary line with rib involvement and destruction and low-attenuation changes of the center indicating necrotic mass correlates with a neoplastic lesion likely metastatic disease. Musculoskeletal: Rib lesion as described above CT ABDOMEN PELVIS FINDINGS Hepatobiliary: Liver is enlarged, inhomogeneous with multiple enhancing lesions throughout the right and left lobe of the liver consistent with extensive numerous metastatic liver lesions. Liver appears completely replaced by metastatic disease. Gallbladder not clearly identified without biliary dilatation. Pancreas: Unremarkable. No pancreatic ductal dilatation or surrounding inflammatory changes. Spleen: Small spleen without lesions Adrenals/Urinary Tract: Adrenal glands are unremarkable. Kidneys are normal, without renal calculi, focal lesion, or hydronephrosis. Bladder is unremarkable. Stomach/Bowel: Stomach is within normal limits. Appendix appears normal. No  evidence of bowel wall thickening, distention, or inflammatory changes. Vascular/Lymphatic: Aortic atherosclerosis. No enlarged abdominal or pelvic lymph nodes. Reproductive: Uterus and bilateral adnexa are unremarkable. Other: No abdominal wall hernia or abnormality. No abdominopelvic ascites. Musculoskeletal: Lytic metastatic bone destructive lesion involving the right anterior superior iliac crest measuring 3.4 x 2.4 cm. Similar lesion on the left iliac bone measuring 3.5 x 2.4 cm. Healed fracture of the right proximal pubic ramus right inferior pubic ramus  demonstrates a lytic destructive bone lesion measuring 2.2 x 1.7 cm 1.8 x 1 cm lytic bone lesion involving the left iliac bone of the level of the left sacroiliac joint Suggestive of lytic bone changes involving the L4 vertebral body as well as the upper thoracic vertebral bodies T1-T2. IMPRESSION: *3.8 x 5.5 x 5 cm left anterior chest wall mass along the mid axillary line with rib involvement and destruction and low-attenuation changes of the center indicating necrotic mass correlates with a neoplastic lesion likely metastatic disease. *Liver is enlarged, inhomogeneous with multiple enhancing lesions throughout the right and left lobe of the liver consistent with extensive numerous metastatic liver lesions. Liver appears completely replaced by metastatic disease. 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 *Suggestive of lytic bone changes involving the upper thoracic vertebral bodies T1-T2. *Aortic atherosclerosis. Electronically Signed   By: Fredrich Jefferson M.D.   On: 10/26/2023 15:14    Labs:  CBC: Recent Labs    07/11/23 1239 10/27/23 1243 11/14/23 0659  WBC 7.0 4.3 3.9*  HGB 12.1 11.5* 11.1*  HCT 35.4* 33.7* 32.9*  PLT 311 289 279    COAGS: Recent Labs    11/14/23 0659  INR 1.1    BMP: Recent Labs    07/04/23 1010 07/11/23 1239 10/17/23 1107 10/27/23 1243  NA 138 137 136 140  K 3.7 3.2* 3.6 3.8  CL 102 104 103 105  CO2 27 26 26 29   GLUCOSE 95 95 99 99  BUN 13 14 10 10   CALCIUM  9.6 9.9 9.3 9.7  CREATININE 0.70 0.73 0.75 0.81  GFRNONAA  --  >60  --  >60    LIVER FUNCTION TESTS: Recent Labs    07/04/23 1010 07/11/23 1239 10/17/23 1107 10/27/23 1243  BILITOT 0.4 0.3 0.4 0.3  AST 31 41 43* 40  ALT 29 43 38* 36  ALKPHOS 92 97 173* 191*  PROT 7.3 7.5 6.9 7.3  ALBUMIN 4.1 4.4 4.0  4.0 4.3    TUMOR MARKERS: No results for input(s): "AFPTM",  "CEA", "CA199", "CHROMGRNA" in the last 8760 hours.  Assessment and Plan: 58 y.o. female with past medical history significant for anemia, colon polyps, hypertension, renal artery stenosis who presents now with recurrent metastatic left breast cancer(initial diagnosis 2004). She is scheduled today for Port-A-Cath placement to assist with treatment.  Patient has had prior surgically placed port in 2020.  She is known to IR team from left rib soft tissue mass biopsy on 11/15/23.Risks and benefits of image guided port-a-catheter placement was discussed with the patient including, but not limited to bleeding, infection, pneumothorax, or fibrin sheath development and need for additional procedures.  All of the patient's questions were answered, patient is agreeable to proceed. Consent signed and in chart.    Thank you for allowing our service to participate in Paige Foster 's care.  Electronically Signed: D. Honore Lux, PA-C   11/24/2023, 2:38 PM  I spent a total of  20 minutes   in face to face in clinical consultation, greater than 50% of which was counseling/coordinating care for Port-A-Cath placement

## 2023-11-24 NOTE — Telephone Encounter (Signed)
 Completed [facility] [employer] [facility] Claim paperwork then sent to provider for review and signature. Received completed form then faxed to [phone] & [phone] with pathology reports [id number] AND [id number].   Emailed to [employer], [email or web address]  Copy to [email or web address] bin for items to be scanned.  .   No further instructions received or actions performed by this nurse.

## 2023-11-25 ENCOUNTER — Ambulatory Visit (HOSPITAL_COMMUNITY)

## 2023-11-25 ENCOUNTER — Ambulatory Visit (HOSPITAL_COMMUNITY)
Admission: RE | Admit: 2023-11-25 | Discharge: 2023-11-25 | Disposition: A | Discharge status: Home / Self Care | Source: Ambulatory Visit | Attending: Hematology and Oncology | Admitting: Hematology and Oncology

## 2023-11-25 ENCOUNTER — Telehealth: Payer: Self-pay

## 2023-11-25 ENCOUNTER — Encounter (HOSPITAL_COMMUNITY): Payer: Self-pay

## 2023-11-25 ENCOUNTER — Other Ambulatory Visit: Payer: Self-pay

## 2023-11-25 ENCOUNTER — Ambulatory Visit
Admission: RE | Admit: 2023-11-25 | Discharge: 2023-11-25 | Disposition: A | Discharge status: Home / Self Care | Source: Ambulatory Visit | Attending: Radiation Oncology | Admitting: Radiation Oncology

## 2023-11-25 DIAGNOSIS — Z79899 Other long term (current) drug therapy: Secondary | ICD-10-CM | POA: Insufficient documentation

## 2023-11-25 DIAGNOSIS — C50412 Malignant neoplasm of upper-outer quadrant of left female breast: Secondary | ICD-10-CM | POA: Insufficient documentation

## 2023-11-25 DIAGNOSIS — Z510 Encounter for antineoplastic radiation therapy: Secondary | ICD-10-CM | POA: Insufficient documentation

## 2023-11-25 DIAGNOSIS — C7951 Secondary malignant neoplasm of bone: Secondary | ICD-10-CM | POA: Diagnosis not present

## 2023-11-25 DIAGNOSIS — Z170 Estrogen receptor positive status [ER+]: Secondary | ICD-10-CM

## 2023-11-25 DIAGNOSIS — C50912 Malignant neoplasm of unspecified site of left female breast: Secondary | ICD-10-CM | POA: Diagnosis not present

## 2023-11-25 DIAGNOSIS — I701 Atherosclerosis of renal artery: Secondary | ICD-10-CM | POA: Insufficient documentation

## 2023-11-25 DIAGNOSIS — I10 Essential (primary) hypertension: Secondary | ICD-10-CM | POA: Insufficient documentation

## 2023-11-25 HISTORY — PX: IR IMAGING GUIDED PORT INSERTION: IMG5740

## 2023-11-25 LAB — RAD ONC ARIA SESSION SUMMARY
Course Elapsed Days: 3
Plan Fractions Treated to Date: 3
Plan Fractions Treated to Date: 3
Plan Prescribed Dose Per Fraction: 4 Gy
Plan Prescribed Dose Per Fraction: 4 Gy
Plan Total Fractions Prescribed: 5
Plan Total Fractions Prescribed: 5
Plan Total Prescribed Dose: 20 Gy
Plan Total Prescribed Dose: 20 Gy
Reference Point Dosage Given to Date: 12 Gy
Reference Point Dosage Given to Date: 12 Gy
Reference Point Session Dosage Given: 4 Gy
Reference Point Session Dosage Given: 4 Gy
Session Number: 3

## 2023-11-25 MED ORDER — FENTANYL CITRATE (PF) 100 MCG/2ML IJ SOLN
INTRAMUSCULAR | Status: AC | PRN
  Administered 2023-11-25: 50 ug via INTRAVENOUS

## 2023-11-25 MED ORDER — HEPARIN SOD (PORK) LOCK FLUSH 100 UNIT/ML IV SOLN
500.0000 [IU] | Freq: Once | INTRAVENOUS | Status: AC
  Administered 2023-11-25: 500 [IU] via INTRAVENOUS

## 2023-11-25 MED ORDER — LIDOCAINE-EPINEPHRINE 1 %-1:100000 IJ SOLN
20.0000 mL | Freq: Once | INTRAMUSCULAR | Status: AC
  Administered 2023-11-25: 15 mL via INTRADERMAL

## 2023-11-25 MED ORDER — MIDAZOLAM HCL 2 MG/2ML IJ SOLN
INTRAMUSCULAR | Status: AC
  Filled 2023-11-25: qty 2

## 2023-11-25 MED ORDER — LIDOCAINE-EPINEPHRINE 1 %-1:100000 IJ SOLN
INTRAMUSCULAR | Status: AC
  Filled 2023-11-25: qty 1

## 2023-11-25 MED ORDER — FENTANYL CITRATE (PF) 100 MCG/2ML IJ SOLN
INTRAMUSCULAR | Status: AC
  Filled 2023-11-25: qty 2

## 2023-11-25 MED ORDER — HEPARIN SOD (PORK) LOCK FLUSH 100 UNIT/ML IV SOLN
INTRAVENOUS | Status: AC
  Filled 2023-11-25: qty 5

## 2023-11-25 MED ORDER — MIDAZOLAM HCL 2 MG/2ML IJ SOLN
INTRAMUSCULAR | Status: AC | PRN
  Administered 2023-11-25: 1 mg via INTRAVENOUS

## 2023-11-25 MED ORDER — SODIUM CHLORIDE 0.9 % IV SOLN: INTRAVENOUS | Status: DC

## 2023-11-25 NOTE — Discharge Instructions (Signed)
 Please call Interventional Radiology clinic [phone] with any questions or concerns.  You may remove your dressing and shower tomorrow.  After the procedure, it is common to have: Discomfort at the port insertion site. Bruising on the skin over the port. This should improve over 3-4 days  Follow these instructions at home:  Medication: Do not use Aspirin or ibuprofen products, such as Advil or Motrin, as it may increase bleeding.  You may resume your usual medications as ordered by your doctor. If your doctor prescribed antibiotics, take them as directed. Do not stop taking them just because you feel better. You need to take the full course of antibiotics.  Eating and drinking: Drink plenty of liquids to keep your urine pale yellow You can resume your regular diet as directed by your doctor   Care of the procedure site Follow instructions from your health care provider about how to take care of your port insertion site. Make sure you: After your port is placed, you will get a manufacturer's information card. The card has information about your port. Keep this card with you at all times Make sure to remember what type of port you have Take care of the port as told by your health care provider DO NOT use EMLA cream for 2 weeks after port placement -the cream will remove the surgical glue on your incision DO NOT use any lotions, creams, or ointments on incision for 2 weeks. This will remove the surgical glue on your incision Wash your hands with soap and water before and after you change your bandage (dressing). If soap and water are not available, use hand sanitizer Change your dressing as told by your health care provider Leave skin glue, or adhesive strips in place. These skin closures may need to stay in place for 2 weeks or longer Check your port insertion site every day for signs of infection. Check for: Redness, swelling, or pain Fluid or blood Warmth Pus or a bad  smell  Activity Return to your normal activities as told by your health care provider. Ask your health care provider what activities are safe for you Do not lift anything that is heavier than 10 lb (4.5 kg), or the limit that you are told, until your health care provider says that it is safe Do not take baths, swim, or use a hot tub until your health care provider approves. Take showers only. Keep all follow-up visits as told by your doctor  Contact a health care provider if: You cannot flush your port with saline as directed, or you cannot draw blood from the port You have a fever or chills You have redness, swelling, or pain around your port insertion site You have fluid or blood coming from your port insertion site Your port insertion site feels warm to the touch You have pus or a bad smell coming from the port insertion site  Get help right away if: You have chest pain or shortness of breath You have bleeding from your port that you cannot control  Moderate Conscious Sedation-Care After  This sheet gives you information about how to care for yourself after your procedure. Your health care provider may also give you more specific instructions. If you have problems or questions, contact your health care provider.  After the procedure, it is common to have: Sleepiness for several hours. Impaired judgment for several hours. Difficulty with balance. Vomiting if you eat too soon.  Follow these instructions at home:  Rest. Do not  participate in activities where you could fall or become injured. Do not drive or use machinery. Do not drink alcohol. Do not take sleeping pills or medicines that cause drowsiness. Do not make important decisions or sign legal documents. Do not take care of children on your own.  Eating and drinking Follow the diet recommended by your health care provider. Drink enough fluid to keep your urine pale yellow. If you vomit: Drink water, juice, or soup  when you can drink without vomiting. Make sure you have little or no nausea before eating solid foods.  General instructions Take over-the-counter and prescription medicines only as told by your health care provider. Have a responsible adult stay with you for the time you are told. It is important to have someone help care for you until you are awake and alert. Do not smoke. Keep all follow-up visits as told by your health care provider. This is important.  Contact a health care provider if: You are still sleepy or having trouble with balance after 24 hours. You feel light-headed. You keep feeling nauseous or you keep vomiting. You develop a rash. You have a fever. You have redness or swelling around the IV site.  Get help right away if: You have trouble breathing. You have new-onset confusion at home.  This information is not intended to replace advice given to you by your health care provider. Make sure you discuss any questions you have with your healthcare provider.

## 2023-11-25 NOTE — Telephone Encounter (Signed)
 Pt called and states she missed her echo appt 11/18/23. She was given number to call [facility] to reschedule. She verbalized thanks and understanding.

## 2023-11-25 NOTE — Procedures (Signed)
 Interventional Radiology Procedure Note  Procedure: Placement of a right IJ approach single lumen Bard Clear Vue port.  Tip is positioned at the superior cavoatrial junction and catheter is ready for immediate use.   Complications: No immediate  Recommendations:  - Ok to shower tomorrow - Do not submerge for 7 days - Routine line care    Signed,  Roxie Cord, MD

## 2023-11-25 NOTE — Progress Notes (Signed)
 Pharmacist Chemotherapy Monitoring - Initial Assessment    Anticipated start date: 12/02/23   The following has been reviewed per standard work regarding the patient's treatment regimen: The patient's diagnosis, treatment plan and drug doses, and organ/hematologic function Lab orders and baseline tests specific to treatment regimen  The treatment plan start date, drug sequencing, and pre-medications Prior authorization status  Patient's documented medication list, including drug-drug interaction screen and prescriptions for anti-emetics and supportive care specific to the treatment regimen The drug concentrations, fluid compatibility, administration routes, and timing of the medications to be used The patient's access for treatment and lifetime cumulative dose history, if applicable  The patient's medication allergies and previous infusion related reactions, if applicable   Changes made to treatment plan:  N/A  Follow up needed:  ECHO (expected 5/2). 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 Patient was changed to gemcitabine  and received 1 dose before chemo was stopped due to intercurrent eye surgery. Neuropathy concerns were discussed with patient. Staff message sent to MD/RN regarding safety of reinitiating docetaxel  and potential need for clearance from ophthalmologist.  Paige Foster, PharmD, MBA

## 2023-11-28 ENCOUNTER — Other Ambulatory Visit: Payer: Self-pay | Admitting: Hematology and Oncology

## 2023-11-28 ENCOUNTER — Ambulatory Visit: Admitting: Radiation Oncology

## 2023-11-28 ENCOUNTER — Ambulatory Visit
Admission: RE | Admit: 2023-11-28 | Discharge: 2023-11-28 | Disposition: A | Discharge status: Home / Self Care | Source: Ambulatory Visit | Attending: Radiation Oncology

## 2023-11-28 ENCOUNTER — Ambulatory Visit

## 2023-11-28 ENCOUNTER — Other Ambulatory Visit: Payer: Self-pay

## 2023-11-28 ENCOUNTER — Encounter: Payer: Self-pay | Admitting: Hematology and Oncology

## 2023-11-28 ENCOUNTER — Other Ambulatory Visit: Payer: Self-pay | Admitting: Radiation Oncology

## 2023-11-28 ENCOUNTER — Inpatient Hospital Stay: Attending: Hematology and Oncology

## 2023-11-28 DIAGNOSIS — Z9013 Acquired absence of bilateral breasts and nipples: Secondary | ICD-10-CM | POA: Insufficient documentation

## 2023-11-28 DIAGNOSIS — C50412 Malignant neoplasm of upper-outer quadrant of left female breast: Secondary | ICD-10-CM | POA: Insufficient documentation

## 2023-11-28 DIAGNOSIS — C7951 Secondary malignant neoplasm of bone: Secondary | ICD-10-CM | POA: Insufficient documentation

## 2023-11-28 DIAGNOSIS — Z170 Estrogen receptor positive status [ER+]: Secondary | ICD-10-CM | POA: Insufficient documentation

## 2023-11-28 DIAGNOSIS — C787 Secondary malignant neoplasm of liver and intrahepatic bile duct: Secondary | ICD-10-CM | POA: Insufficient documentation

## 2023-11-28 DIAGNOSIS — Z5111 Encounter for antineoplastic chemotherapy: Secondary | ICD-10-CM | POA: Insufficient documentation

## 2023-11-28 DIAGNOSIS — Z79811 Long term (current) use of aromatase inhibitors: Secondary | ICD-10-CM | POA: Insufficient documentation

## 2023-11-28 DIAGNOSIS — Z510 Encounter for antineoplastic radiation therapy: Secondary | ICD-10-CM | POA: Diagnosis not present

## 2023-11-28 LAB — RAD ONC ARIA SESSION SUMMARY
Course Elapsed Days: 6
Plan Fractions Treated to Date: 4
Plan Fractions Treated to Date: 4
Plan Prescribed Dose Per Fraction: 4 Gy
Plan Prescribed Dose Per Fraction: 4 Gy
Plan Total Fractions Prescribed: 5
Plan Total Fractions Prescribed: 5
Plan Total Prescribed Dose: 20 Gy
Plan Total Prescribed Dose: 20 Gy
Reference Point Dosage Given to Date: 16 Gy
Reference Point Dosage Given to Date: 16 Gy
Reference Point Session Dosage Given: 4 Gy
Reference Point Session Dosage Given: 4 Gy
Session Number: 4

## 2023-11-28 MED ORDER — ONDANSETRON HCL 8 MG PO TABS: 8.0000 mg | ORAL_TABLET | Freq: Three times a day (TID) | ORAL | 3 refills | Status: AC | PRN

## 2023-11-28 NOTE — Progress Notes (Unsigned)
 I called Paige Foster, I suggested she follow up with Dr Lydia Sams from ophthalmologist since there was a concern raised about her eye issues back in 2020 while she was on docetaxel . I spoke to Dr Lydia Sams myself and he graciously agreed to see her right away. Paige Foster however is worried that she has to miss work for all these appointment. I tried to clearly explain to her the importance of the appointment. She still wanted me to talk to her Dr Alto Atta who appears to be an optometrist. I explained to her the difference between optometrist and ophthalmologist and their respective roles. If she is unable to make to her appt, I still think we should proceed with treatment given advanced cancer, benefits and risks.  Christiane Sistare

## 2023-11-28 NOTE — Progress Notes (Signed)
 Nutrition Assessment  58 year old female with metastatic HER2 positive breast cancer.  Planning to start docetaxel , trastuzumab  and pertuzumab .  Receiving radiation currently to cervical spine, shoulder and lumbar spine. S/p breast cancer, left mastectomy  Met with patient in RD office prior to going to radiation.  Reports decreased intake, eating about half of normal intake.  Yesterday ate fish sandwich and few fries.  Sometimes drinks a smoothie from [employer].  Says that she is having nausea and certain smells make her feel sick on her stomach. Also feels that her anxiety makes her feel nauseated. Says that she is nauseated frequently, almost daily. Drinks oral nutrition supplements at times.  Says that she has a metallic taste in her mouth.  Likes vinegar flavors.      Medications: anstrozole   Labs: reviewed   Anthropometrics:   Height: 64 inches Weight: 127 lb on 5/2 UBW: 145 lb Dec 2024 BMI: 21  12% weight loss in the last 4 months, significant   Estimated Energy Needs  Kcals: 1425-1700 Protein: 71-85 g Fluid: 1425-1700 ml   NUTRITION DIAGNOSIS: Inadequate oral intake related to cancer, altered taste, nausea evidenced by 12% weight loss in the last 4 months and decreased intake    INTERVENTION:  Discussed strategies to help with taste alterations. Handout provided Discussed foods to choose with nausea. Handout provided. Message sent to Medical oncology regarding nausea and no medications currently prescribed Offered LCSW for counseling to help with anxiety.  Patient declined Encouraged oral nutrition supplement Contact information provided   MONITORING, EVALUATION, GOAL: weight trends, intake   Next Visit: to be determined  Averey Trompeter B. Zollie Hipp, CSO, LDN Registered Dietitian [phone]

## 2023-11-29 ENCOUNTER — Ambulatory Visit

## 2023-11-29 ENCOUNTER — Other Ambulatory Visit: Payer: Self-pay | Admitting: Hematology and Oncology

## 2023-11-29 ENCOUNTER — Ambulatory Visit
Admission: RE | Admit: 2023-11-29 | Discharge: 2023-11-29 | Disposition: A | Discharge status: Home / Self Care | Source: Ambulatory Visit | Attending: Radiation Oncology | Admitting: Radiation Oncology

## 2023-11-29 ENCOUNTER — Other Ambulatory Visit: Payer: Self-pay | Admitting: *Deleted

## 2023-11-29 ENCOUNTER — Other Ambulatory Visit: Payer: Self-pay

## 2023-11-29 ENCOUNTER — Ambulatory Visit (HOSPITAL_COMMUNITY)
Admission: RE | Admit: 2023-11-29 | Discharge: 2023-11-29 | Disposition: A | Discharge status: Home / Self Care | Source: Ambulatory Visit | Attending: Vascular Surgery | Admitting: Vascular Surgery

## 2023-11-29 DIAGNOSIS — Z170 Estrogen receptor positive status [ER+]: Secondary | ICD-10-CM | POA: Insufficient documentation

## 2023-11-29 DIAGNOSIS — Z0189 Encounter for other specified special examinations: Secondary | ICD-10-CM

## 2023-11-29 DIAGNOSIS — Z510 Encounter for antineoplastic radiation therapy: Secondary | ICD-10-CM | POA: Diagnosis not present

## 2023-11-29 DIAGNOSIS — C50412 Malignant neoplasm of upper-outer quadrant of left female breast: Secondary | ICD-10-CM | POA: Diagnosis not present

## 2023-11-29 DIAGNOSIS — C7951 Secondary malignant neoplasm of bone: Secondary | ICD-10-CM | POA: Diagnosis not present

## 2023-11-29 DIAGNOSIS — C50912 Malignant neoplasm of unspecified site of left female breast: Secondary | ICD-10-CM

## 2023-11-29 LAB — RAD ONC ARIA SESSION SUMMARY
Course Elapsed Days: 7
Plan Fractions Treated to Date: 5
Plan Fractions Treated to Date: 5
Plan Prescribed Dose Per Fraction: 4 Gy
Plan Prescribed Dose Per Fraction: 4 Gy
Plan Total Fractions Prescribed: 5
Plan Total Fractions Prescribed: 5
Plan Total Prescribed Dose: 20 Gy
Plan Total Prescribed Dose: 20 Gy
Reference Point Dosage Given to Date: 20 Gy
Reference Point Dosage Given to Date: 20 Gy
Reference Point Session Dosage Given: 4 Gy
Reference Point Session Dosage Given: 4 Gy
Session Number: 5

## 2023-11-29 LAB — ECHOCARDIOGRAM COMPLETE
AR max vel: 2.04 cm2
AV Area VTI: 2 cm2
AV Area mean vel: 2.11 cm2
AV Mean grad: 4 mmHg
AV Peak grad: 10.9 mmHg
Ao pk vel: 1.65 m/s
Area-P 1/2: 3.93 cm2
S' Lateral: 2.89 cm

## 2023-11-29 MED ORDER — DEXAMETHASONE 4 MG PO TABS: ORAL_TABLET | ORAL | 1 refills | Status: AC

## 2023-11-29 MED ORDER — PROCHLORPERAZINE MALEATE 10 MG PO TABS: 10.0000 mg | ORAL_TABLET | Freq: Four times a day (QID) | ORAL | 1 refills | Status: AC | PRN

## 2023-11-29 MED ORDER — ONDANSETRON HCL 8 MG PO TABS: 8.0000 mg | ORAL_TABLET | Freq: Three times a day (TID) | ORAL | 1 refills | Status: DC | PRN

## 2023-11-29 MED ORDER — LIDOCAINE-PRILOCAINE 2.5-2.5 % EX CREA: TOPICAL_CREAM | CUTANEOUS | 3 refills | Status: DC

## 2023-11-29 MED ORDER — LIDOCAINE-PRILOCAINE 2.5-2.5 % EX CREA: TOPICAL_CREAM | CUTANEOUS | 3 refills | Status: AC

## 2023-11-30 ENCOUNTER — Ambulatory Visit

## 2023-11-30 ENCOUNTER — Other Ambulatory Visit

## 2023-11-30 DIAGNOSIS — H2511 Age-related nuclear cataract, right eye: Secondary | ICD-10-CM | POA: Diagnosis not present

## 2023-11-30 DIAGNOSIS — H35363 Drusen (degenerative) of macula, bilateral: Secondary | ICD-10-CM | POA: Diagnosis not present

## 2023-11-30 DIAGNOSIS — H401123 Primary open-angle glaucoma, left eye, severe stage: Secondary | ICD-10-CM | POA: Diagnosis not present

## 2023-11-30 DIAGNOSIS — H59812 Chorioretinal scars after surgery for detachment, left eye: Secondary | ICD-10-CM | POA: Diagnosis not present

## 2023-11-30 NOTE — Radiation Completion Notes (Signed)
 Patient Name: Paige Foster, Paige Foster MRN: [id number] Date of Birth: [birth year] Referring Physician: Kathrene Parents, M.D. Date of Service: 2023-11-30 Radiation Oncologist: Colie Dawes, M.D. [facility] [place] [facility]                             RADIATION ONCOLOGY END OF TREATMENT NOTE     Diagnosis: C79.51 Secondary malignant neoplasm of bone Staging on 2018-08-30: Malignant neoplasm of upper-outer quadrant of left breast in female, estrogen receptor positive (HCC) T=cT1c, N=cN0, M=cM0 Staging on 2018-09-29: Recurrent breast cancer, left (HCC) T=cT2, N=cN0, M=cM0 Intent: Palliative     ==========DELIVERED PLANS==========  First Treatment Date: 2023-11-22 Last Treatment Date: 2023-11-29   Plan Name: Spine_L Site: Lumbar Spine Technique: 3D Mode: Photon Dose Per Fraction: 4 Gy Prescribed Dose (Delivered / Prescribed): 20 Gy / 20 Gy Prescribed Fxs (Delivered / Prescribed): 5 / 5   Plan Name: Ext_R_Shld Site: Humerus, Right Technique: 3D Mode: Photon Dose Per Fraction: 4 Gy Prescribed Dose (Delivered / Prescribed): 20 Gy / 20 Gy Prescribed Fxs (Delivered / Prescribed): 5 / 5     ==========ON TREATMENT VISIT DATES========== 2023-11-28     ==========UPCOMING VISITS==========       ==========APPENDIX - ON TREATMENT VISIT NOTES==========   See weekly On Treatment Notes in Epic for details in the Media tab (listed as Progress notes on the On Treatment Visit Dates listed above).

## 2023-12-01 ENCOUNTER — Other Ambulatory Visit: Payer: Self-pay

## 2023-12-01 ENCOUNTER — Ambulatory Visit

## 2023-12-01 DIAGNOSIS — C50412 Malignant neoplasm of upper-outer quadrant of left female breast: Secondary | ICD-10-CM

## 2023-12-02 ENCOUNTER — Inpatient Hospital Stay

## 2023-12-02 ENCOUNTER — Ambulatory Visit

## 2023-12-02 ENCOUNTER — Telehealth: Payer: Self-pay | Admitting: *Deleted

## 2023-12-02 ENCOUNTER — Encounter: Payer: Self-pay | Admitting: Hematology and Oncology

## 2023-12-02 VITALS — BP 115/73 | HR 88 | Temp 98.8°F | Resp 16 | Wt 125.0 lb

## 2023-12-02 DIAGNOSIS — Z5111 Encounter for antineoplastic chemotherapy: Secondary | ICD-10-CM | POA: Diagnosis not present

## 2023-12-02 DIAGNOSIS — C7951 Secondary malignant neoplasm of bone: Secondary | ICD-10-CM | POA: Diagnosis not present

## 2023-12-02 DIAGNOSIS — C787 Secondary malignant neoplasm of liver and intrahepatic bile duct: Secondary | ICD-10-CM | POA: Diagnosis not present

## 2023-12-02 DIAGNOSIS — C50412 Malignant neoplasm of upper-outer quadrant of left female breast: Secondary | ICD-10-CM | POA: Diagnosis not present

## 2023-12-02 DIAGNOSIS — C50912 Malignant neoplasm of unspecified site of left female breast: Secondary | ICD-10-CM

## 2023-12-02 DIAGNOSIS — Z170 Estrogen receptor positive status [ER+]: Secondary | ICD-10-CM | POA: Diagnosis not present

## 2023-12-02 DIAGNOSIS — Z9013 Acquired absence of bilateral breasts and nipples: Secondary | ICD-10-CM | POA: Diagnosis not present

## 2023-12-02 DIAGNOSIS — Z79811 Long term (current) use of aromatase inhibitors: Secondary | ICD-10-CM | POA: Diagnosis not present

## 2023-12-02 LAB — CMP (CANCER CENTER ONLY)
ALT: 26 U/L (ref 0–44)
AST: 30 U/L (ref 15–41)
Albumin: 4 g/dL (ref 3.5–5.0)
Alkaline Phosphatase: 144 U/L — ABNORMAL HIGH (ref 38–126)
Anion gap: 5 (ref 5–15)
BUN: 15 mg/dL (ref 6–20)
CO2: 29 mmol/L (ref 22–32)
Calcium: 9.4 mg/dL (ref 8.9–10.3)
Chloride: 106 mmol/L (ref 98–111)
Creatinine: 0.72 mg/dL (ref 0.44–1.00)
GFR, Estimated: >60 mL/min (ref >60)
Glucose, Bld: 91 mg/dL (ref 70–99)
Potassium: 4.3 mmol/L (ref 3.5–5.1)
Sodium: 140 mmol/L (ref 135–145)
Total Bilirubin: 0.3 mg/dL (ref 0.0–1.2)
Total Protein: 6.9 g/dL (ref 6.5–8.1)

## 2023-12-02 LAB — CBC WITH DIFFERENTIAL (CANCER CENTER ONLY)
Abs Immature Granulocytes: 0.01 10*3/uL (ref 0.00–0.07)
Basophils Absolute: 0 10*3/uL (ref 0.0–0.1)
Basophils Relative: 1 %
Eosinophils Absolute: 0.1 10*3/uL (ref 0.0–0.5)
Eosinophils Relative: 2 %
HCT: 33.4 % — ABNORMAL LOW (ref 36.0–46.0)
Hemoglobin: 11.6 g/dL — ABNORMAL LOW (ref 12.0–15.0)
Immature Granulocytes: 0 %
Lymphocytes Relative: 16 %
Lymphs Abs: 0.5 10*3/uL — ABNORMAL LOW (ref 0.7–4.0)
MCH: 31.4 pg (ref 26.0–34.0)
MCHC: 34.7 g/dL (ref 30.0–36.0)
MCV: 90.3 fL (ref 80.0–100.0)
Monocytes Absolute: 0.4 10*3/uL (ref 0.1–1.0)
Monocytes Relative: 13 %
Neutro Abs: 1.9 10*3/uL (ref 1.7–7.7)
Neutrophils Relative %: 68 %
Platelet Count: 221 10*3/uL (ref 150–400)
RBC: 3.7 MIL/uL — ABNORMAL LOW (ref 3.87–5.11)
RDW: 13.6 % (ref 11.5–15.5)
WBC Count: 2.8 10*3/uL — ABNORMAL LOW (ref 4.0–10.5)
nRBC: 0 % (ref 0.0–0.2)

## 2023-12-02 MED ORDER — HEPARIN SOD (PORK) LOCK FLUSH 100 UNIT/ML IV SOLN
500.0000 [IU] | Freq: Once | INTRAVENOUS | Status: AC | PRN
  Administered 2023-12-02: 500 [IU]

## 2023-12-02 MED ORDER — ZOLEDRONIC ACID 4 MG/100ML IV SOLN
4.0000 mg | Freq: Once | INTRAVENOUS | Status: AC
  Administered 2023-12-02: 4 mg via INTRAVENOUS
  Filled 2023-12-02: qty 100

## 2023-12-02 MED ORDER — DEXAMETHASONE SODIUM PHOSPHATE 10 MG/ML IJ SOLN
10.0000 mg | Freq: Once | INTRAMUSCULAR | Status: AC
  Administered 2023-12-02: 10 mg via INTRAVENOUS
  Filled 2023-12-02: qty 1

## 2023-12-02 MED ORDER — SODIUM CHLORIDE 0.9 % IV SOLN
75.0000 mg/m2 | Freq: Once | INTRAVENOUS | Status: AC
  Administered 2023-12-02: 121 mg via INTRAVENOUS
  Filled 2023-12-02: qty 12.1

## 2023-12-02 MED ORDER — SODIUM CHLORIDE 0.9% FLUSH
10.0000 mL | INTRAVENOUS | Status: DC | PRN
  Administered 2023-12-02: 10 mL

## 2023-12-02 MED ORDER — TRASTUZUMAB-ANNS CHEMO 150 MG IV SOLR
8.0000 mg/kg | Freq: Once | INTRAVENOUS | Status: AC
  Administered 2023-12-02: 420 mg via INTRAVENOUS
  Filled 2023-12-02: qty 20

## 2023-12-02 MED ORDER — SODIUM CHLORIDE 0.9 % IV SOLN: INTRAVENOUS | Status: DC

## 2023-12-02 MED ORDER — ACETAMINOPHEN 325 MG PO TABS
650.0000 mg | ORAL_TABLET | Freq: Once | ORAL | Status: AC
Start: 2023-12-02 — End: 2023-12-02
  Administered 2023-12-02: 650 mg via ORAL
  Filled 2023-12-02: qty 2

## 2023-12-02 MED ORDER — PERTUZUMAB CHEMO INJECTION 420 MG/14ML
840.0000 mg | Freq: Once | INTRAVENOUS | Status: AC
  Administered 2023-12-02: 840 mg via INTRAVENOUS
  Filled 2023-12-02: qty 28

## 2023-12-02 MED ORDER — DIPHENHYDRAMINE HCL 25 MG PO CAPS
50.0000 mg | ORAL_CAPSULE | Freq: Once | ORAL | Status: AC
  Administered 2023-12-02: 50 mg via ORAL
  Filled 2023-12-02: qty 2

## 2023-12-02 NOTE — Patient Instructions (Signed)
 [facility] - A DEPT OF [facility]  Discharge Instructions: Thank you for choosing [facility] to provide your oncology and hematology care.   If you have a lab appointment with the Cancer Center, please go directly to the Cancer Center and check in at the registration area.   Wear comfortable clothing and clothing appropriate for easy access to any Portacath or PICC line.   We strive to give you quality time with your provider. You may need to reschedule your appointment if you arrive late (15 or more minutes).  Arriving late affects you and other patients whose appointments are after yours.  Also, if you miss three or more appointments without notifying the office, you may be dismissed from the clinic at the provider's discretion.      For prescription refill requests, have your pharmacy contact our office and allow 72 hours for refills to be completed.    Today you received the following chemotherapy and/or immunotherapy agents: DOCEtaxel  (TAXOTERE ), pertuzumab  (PERJETA ), and trastuzumab -anns (KANJINTI    To help prevent nausea and vomiting after your treatment, we encourage you to take your nausea medication as directed.  BELOW ARE SYMPTOMS THAT SHOULD BE REPORTED IMMEDIATELY: *FEVER GREATER THAN 100.4 F (38 C) OR HIGHER *CHILLS OR SWEATING *NAUSEA AND VOMITING THAT IS NOT CONTROLLED WITH YOUR NAUSEA MEDICATION *UNUSUAL SHORTNESS OF BREATH *UNUSUAL BRUISING OR BLEEDING *URINARY PROBLEMS (pain or burning when urinating, or frequent urination) *BOWEL PROBLEMS (unusual diarrhea, constipation, pain near the anus) TENDERNESS IN MOUTH AND THROAT WITH OR WITHOUT PRESENCE OF ULCERS (sore throat, sores in mouth, or a toothache) UNUSUAL RASH, SWELLING OR PAIN  UNUSUAL VAGINAL DISCHARGE OR ITCHING   Items with * indicate a potential emergency and should be followed up as soon as possible or go to the Emergency Department if any problems should  occur.  Please show the CHEMOTHERAPY ALERT CARD or IMMUNOTHERAPY ALERT CARD at check-in to the Emergency Department and triage nurse.  Should you have questions after your visit or need to cancel or reschedule your appointment, please contact [facility] - A DEPT OF [facility]  Dept: [phone]  and follow the prompts.  Office hours are 8:00 a.m. to 4:30 p.m. Monday - Friday. Please note that voicemails left after 4:00 p.m. may not be returned until the following business day.  We are closed weekends and major holidays. You have access to a nurse at all times for urgent questions. Please call the main number to the clinic Dept: [phone] and follow the prompts.   For any non-urgent questions, you may also contact your provider using MyChart. We now offer e-Visits for anyone 88 and older to request care online for non-urgent symptoms. For details visit [email or web address].   Also download the MyChart app! Go to the app store, search "MyChart", open the app, select [facility], and log in with your MyChart username and password.

## 2023-12-02 NOTE — Telephone Encounter (Signed)
 This RN contacted Dr Basilio Both office per pt's request - spoke with Ginger- she spoke with Dr Lydia Sams who stated pt may proceed with treatment as planned and per his discussion with Dr Arno Bibles earlier this week.

## 2023-12-05 ENCOUNTER — Ambulatory Visit

## 2023-12-05 ENCOUNTER — Inpatient Hospital Stay

## 2023-12-05 VITALS — BP 102/64 | HR 73 | Temp 98.3°F | Resp 16

## 2023-12-05 DIAGNOSIS — C50412 Malignant neoplasm of upper-outer quadrant of left female breast: Secondary | ICD-10-CM | POA: Diagnosis not present

## 2023-12-05 DIAGNOSIS — Z5111 Encounter for antineoplastic chemotherapy: Secondary | ICD-10-CM | POA: Diagnosis not present

## 2023-12-05 DIAGNOSIS — Z9013 Acquired absence of bilateral breasts and nipples: Secondary | ICD-10-CM | POA: Diagnosis not present

## 2023-12-05 DIAGNOSIS — Z79811 Long term (current) use of aromatase inhibitors: Secondary | ICD-10-CM | POA: Diagnosis not present

## 2023-12-05 DIAGNOSIS — Z170 Estrogen receptor positive status [ER+]: Secondary | ICD-10-CM | POA: Diagnosis not present

## 2023-12-05 DIAGNOSIS — C7951 Secondary malignant neoplasm of bone: Secondary | ICD-10-CM | POA: Diagnosis not present

## 2023-12-05 DIAGNOSIS — C787 Secondary malignant neoplasm of liver and intrahepatic bile duct: Secondary | ICD-10-CM | POA: Diagnosis not present

## 2023-12-05 DIAGNOSIS — H401131 Primary open-angle glaucoma, bilateral, mild stage: Secondary | ICD-10-CM | POA: Diagnosis not present

## 2023-12-05 DIAGNOSIS — C50912 Malignant neoplasm of unspecified site of left female breast: Secondary | ICD-10-CM

## 2023-12-05 MED ORDER — PEGFILGRASTIM-CBQV 6 MG/0.6ML ~~LOC~~ SOSY
6.0000 mg | PREFILLED_SYRINGE | Freq: Once | SUBCUTANEOUS | Status: AC
  Administered 2023-12-05: 6 mg via SUBCUTANEOUS
  Filled 2023-12-05: qty 0.6

## 2023-12-05 NOTE — Patient Instructions (Signed)

## 2023-12-06 ENCOUNTER — Telehealth: Payer: Self-pay | Admitting: Hematology and Oncology

## 2023-12-06 ENCOUNTER — Ambulatory Visit

## 2023-12-06 NOTE — Telephone Encounter (Signed)
 Spoke with patient confirming upcoming appointment

## 2023-12-07 ENCOUNTER — Ambulatory Visit

## 2023-12-08 ENCOUNTER — Ambulatory Visit

## 2023-12-09 ENCOUNTER — Ambulatory Visit

## 2023-12-13 ENCOUNTER — Other Ambulatory Visit: Payer: Self-pay | Admitting: Radiation Oncology

## 2023-12-15 ENCOUNTER — Observation Stay (HOSPITAL_COMMUNITY)

## 2023-12-15 ENCOUNTER — Other Ambulatory Visit: Payer: Self-pay

## 2023-12-15 ENCOUNTER — Observation Stay (HOSPITAL_COMMUNITY)
Admission: EM | Admit: 2023-12-15 | Discharge: 2023-12-17 | Disposition: A | Discharge status: Home / Self Care | Attending: Emergency Medicine | Admitting: Emergency Medicine

## 2023-12-15 ENCOUNTER — Emergency Department (HOSPITAL_COMMUNITY)

## 2023-12-15 ENCOUNTER — Encounter: Payer: Self-pay | Admitting: Hematology and Oncology

## 2023-12-15 ENCOUNTER — Encounter (HOSPITAL_COMMUNITY): Payer: Self-pay

## 2023-12-15 ENCOUNTER — Other Ambulatory Visit: Payer: Self-pay | Admitting: Hematology and Oncology

## 2023-12-15 DIAGNOSIS — R531 Weakness: Secondary | ICD-10-CM | POA: Diagnosis not present

## 2023-12-15 DIAGNOSIS — I5032 Chronic diastolic (congestive) heart failure: Secondary | ICD-10-CM

## 2023-12-15 DIAGNOSIS — I21A1 Myocardial infarction type 2: Secondary | ICD-10-CM | POA: Insufficient documentation

## 2023-12-15 DIAGNOSIS — R918 Other nonspecific abnormal finding of lung field: Secondary | ICD-10-CM | POA: Diagnosis not present

## 2023-12-15 DIAGNOSIS — I110 Hypertensive heart disease with heart failure: Secondary | ICD-10-CM | POA: Diagnosis not present

## 2023-12-15 DIAGNOSIS — I214 Non-ST elevation (NSTEMI) myocardial infarction: Secondary | ICD-10-CM | POA: Diagnosis not present

## 2023-12-15 DIAGNOSIS — C7951 Secondary malignant neoplasm of bone: Secondary | ICD-10-CM | POA: Diagnosis not present

## 2023-12-15 DIAGNOSIS — Z79899 Other long term (current) drug therapy: Secondary | ICD-10-CM | POA: Diagnosis not present

## 2023-12-15 DIAGNOSIS — I2489 Other forms of acute ischemic heart disease: Secondary | ICD-10-CM | POA: Diagnosis not present

## 2023-12-15 DIAGNOSIS — R0602 Shortness of breath: Secondary | ICD-10-CM | POA: Diagnosis not present

## 2023-12-15 DIAGNOSIS — J90 Pleural effusion, not elsewhere classified: Secondary | ICD-10-CM | POA: Diagnosis not present

## 2023-12-15 DIAGNOSIS — R110 Nausea: Secondary | ICD-10-CM | POA: Diagnosis not present

## 2023-12-15 DIAGNOSIS — I10 Essential (primary) hypertension: Secondary | ICD-10-CM | POA: Diagnosis not present

## 2023-12-15 DIAGNOSIS — Z4682 Encounter for fitting and adjustment of non-vascular catheter: Secondary | ICD-10-CM | POA: Diagnosis not present

## 2023-12-15 DIAGNOSIS — C787 Secondary malignant neoplasm of liver and intrahepatic bile duct: Secondary | ICD-10-CM | POA: Diagnosis not present

## 2023-12-15 DIAGNOSIS — C50912 Malignant neoplasm of unspecified site of left female breast: Secondary | ICD-10-CM | POA: Diagnosis not present

## 2023-12-15 DIAGNOSIS — R42 Dizziness and giddiness: Secondary | ICD-10-CM | POA: Diagnosis not present

## 2023-12-15 DIAGNOSIS — R000 Tachycardia, unspecified: Secondary | ICD-10-CM | POA: Diagnosis not present

## 2023-12-15 DIAGNOSIS — E876 Hypokalemia: Secondary | ICD-10-CM | POA: Diagnosis not present

## 2023-12-15 DIAGNOSIS — Z5111 Encounter for antineoplastic chemotherapy: Secondary | ICD-10-CM | POA: Diagnosis not present

## 2023-12-15 DIAGNOSIS — Z853 Personal history of malignant neoplasm of breast: Secondary | ICD-10-CM | POA: Diagnosis not present

## 2023-12-15 LAB — CBC WITH DIFFERENTIAL/PLATELET
Abs Immature Granulocytes: 0.8 10*3/uL — ABNORMAL HIGH (ref 0.00–0.07)
Basophils Absolute: 0.1 10*3/uL (ref 0.0–0.1)
Basophils Relative: 0 %
Eosinophils Absolute: 0 10*3/uL (ref 0.0–0.5)
Eosinophils Relative: 0 %
HCT: 28.5 % — ABNORMAL LOW (ref 36.0–46.0)
Hemoglobin: 9.7 g/dL — ABNORMAL LOW (ref 12.0–15.0)
Immature Granulocytes: 4 %
Lymphocytes Relative: 5 %
Lymphs Abs: 0.9 10*3/uL (ref 0.7–4.0)
MCH: 31.2 pg (ref 26.0–34.0)
MCHC: 34 g/dL (ref 30.0–36.0)
MCV: 91.6 fL (ref 80.0–100.0)
Monocytes Absolute: 1.2 10*3/uL — ABNORMAL HIGH (ref 0.1–1.0)
Monocytes Relative: 7 %
Neutro Abs: 15 10*3/uL — ABNORMAL HIGH (ref 1.7–7.7)
Neutrophils Relative %: 84 %
Platelets: 153 10*3/uL (ref 150–400)
RBC: 3.11 MIL/uL — ABNORMAL LOW (ref 3.87–5.11)
RDW: 14.7 % (ref 11.5–15.5)
WBC Morphology: INCREASED
WBC: 18 10*3/uL — ABNORMAL HIGH (ref 4.0–10.5)
nRBC: 0.2 % (ref 0.0–0.2)

## 2023-12-15 LAB — LIPASE, BLOOD: Lipase: 40 U/L (ref 11–51)

## 2023-12-15 LAB — HEPATIC FUNCTION PANEL
ALT: 26 U/L (ref 0–44)
AST: 33 U/L (ref 15–41)
Albumin: 3.2 g/dL — ABNORMAL LOW (ref 3.5–5.0)
Alkaline Phosphatase: 169 U/L — ABNORMAL HIGH (ref 38–126)
Bilirubin, Direct: 0.1 mg/dL (ref 0.0–0.2)
Indirect Bilirubin: 0.2 mg/dL — ABNORMAL LOW (ref 0.3–0.9)
Total Bilirubin: 0.3 mg/dL (ref 0.0–1.2)
Total Protein: 6.2 g/dL — ABNORMAL LOW (ref 6.5–8.1)

## 2023-12-15 LAB — URINALYSIS, ROUTINE W REFLEX MICROSCOPIC
Bilirubin Urine: NEGATIVE
Glucose, UA: NEGATIVE mg/dL
Hgb urine dipstick: NEGATIVE
Ketones, ur: NEGATIVE mg/dL
Leukocytes,Ua: NEGATIVE
Nitrite: NEGATIVE
Protein, ur: NEGATIVE mg/dL
Specific Gravity, Urine: 1.01 (ref 1.005–1.030)
pH: 5 (ref 5.0–8.0)

## 2023-12-15 LAB — LIPID PANEL
Cholesterol: 121 mg/dL (ref 0–200)
HDL: 31 mg/dL — ABNORMAL LOW (ref >40)
LDL Cholesterol: 77 mg/dL (ref 0–99)
Total CHOL/HDL Ratio: 3.9 ratio
Triglycerides: 64 mg/dL (ref <150)
VLDL: 13 mg/dL (ref 0–40)

## 2023-12-15 LAB — BASIC METABOLIC PANEL WITH GFR
Anion gap: 8 (ref 5–15)
BUN: 7 mg/dL (ref 6–20)
CO2: 23 mmol/L (ref 22–32)
Calcium: 8.5 mg/dL — ABNORMAL LOW (ref 8.9–10.3)
Chloride: 99 mmol/L (ref 98–111)
Creatinine, Ser: 0.59 mg/dL (ref 0.44–1.00)
GFR, Estimated: >60 mL/min (ref >60)
Glucose, Bld: 172 mg/dL — ABNORMAL HIGH (ref 70–99)
Potassium: 2.6 mmol/L — CL (ref 3.5–5.1)
Sodium: 130 mmol/L — ABNORMAL LOW (ref 135–145)

## 2023-12-15 LAB — MAGNESIUM: Magnesium: 1.7 mg/dL (ref 1.7–2.4)

## 2023-12-15 LAB — TROPONIN I (HIGH SENSITIVITY)
Troponin I (High Sensitivity): 85 ng/L — ABNORMAL HIGH (ref <18)
Troponin I (High Sensitivity): 401 ng/L (ref <18)
Troponin I (High Sensitivity): 616 ng/L (ref <18)

## 2023-12-15 LAB — HEPARIN LEVEL (UNFRACTIONATED): Heparin Unfractionated: <0.1 [IU]/mL — ABNORMAL LOW (ref 0.30–0.70)

## 2023-12-15 MED ORDER — ONDANSETRON HCL 4 MG PO TABS
4.0000 mg | ORAL_TABLET | Freq: Four times a day (QID) | ORAL | Status: DC | PRN
Start: 2023-12-15 — End: 2023-12-17

## 2023-12-15 MED ORDER — IOHEXOL 350 MG/ML SOLN
75.0000 mL | Freq: Once | INTRAVENOUS | Status: AC | PRN
  Administered 2023-12-15: 75 mL via INTRAVENOUS

## 2023-12-15 MED ORDER — HEPARIN BOLUS VIA INFUSION
3000.0000 [IU] | Freq: Once | INTRAVENOUS | Status: AC
  Administered 2023-12-15: 3000 [IU] via INTRAVENOUS
  Filled 2023-12-15: qty 3000

## 2023-12-15 MED ORDER — POTASSIUM CHLORIDE 10 MEQ/100ML IV SOLN
10.0000 meq | INTRAVENOUS | Status: AC
  Administered 2023-12-15 (×3): 10 meq via INTRAVENOUS
  Filled 2023-12-15 (×3): qty 100

## 2023-12-15 MED ORDER — HEPARIN BOLUS VIA INFUSION
1500.0000 [IU] | Freq: Once | INTRAVENOUS | Status: AC
  Administered 2023-12-15: 1500 [IU] via INTRAVENOUS
  Filled 2023-12-15: qty 1500

## 2023-12-15 MED ORDER — ACETAMINOPHEN 650 MG RE SUPP: 650.0000 mg | Freq: Four times a day (QID) | RECTAL | Status: DC | PRN

## 2023-12-15 MED ORDER — HEPARIN (PORCINE) 25000 UT/250ML-% IV SOLN
950.0000 [IU]/h | INTRAVENOUS | Status: DC
  Administered 2023-12-15: 650 [IU]/h via INTRAVENOUS
  Filled 2023-12-15: qty 250

## 2023-12-15 MED ORDER — SODIUM CHLORIDE 0.9 % WEIGHT BASED INFUSION
3.0000 mL/kg/h | INTRAVENOUS | Status: DC
  Administered 2023-12-16: 3 mL/kg/h via INTRAVENOUS

## 2023-12-15 MED ORDER — AMLODIPINE BESYLATE 5 MG PO TABS
10.0000 mg | ORAL_TABLET | Freq: Every day | ORAL | Status: DC
  Administered 2023-12-16 – 2023-12-17 (×2): 10 mg via ORAL
  Filled 2023-12-15 (×2): qty 2

## 2023-12-15 MED ORDER — ONDANSETRON HCL 4 MG/2ML IJ SOLN: 4.0000 mg | Freq: Four times a day (QID) | INTRAMUSCULAR | Status: DC | PRN

## 2023-12-15 MED ORDER — ENSURE ENLIVE PO LIQD: 237.0000 mL | Freq: Two times a day (BID) | ORAL | Status: DC

## 2023-12-15 MED ORDER — TRAZODONE HCL 50 MG PO TABS
50.0000 mg | ORAL_TABLET | Freq: Every day | ORAL | Status: DC
  Administered 2023-12-15 – 2023-12-16 (×2): 50 mg via ORAL
  Filled 2023-12-15 (×2): qty 1

## 2023-12-15 MED ORDER — SODIUM CHLORIDE 0.9 % WEIGHT BASED INFUSION
1.0000 mL/kg/h | INTRAVENOUS | Status: DC
  Administered 2023-12-16: 1 mL/kg/h via INTRAVENOUS

## 2023-12-15 MED ORDER — ACETAMINOPHEN 325 MG PO TABS: 650.0000 mg | ORAL_TABLET | Freq: Four times a day (QID) | ORAL | Status: DC | PRN

## 2023-12-15 MED ORDER — ASPIRIN 325 MG PO TABS
325.0000 mg | ORAL_TABLET | Freq: Once | ORAL | Status: AC
Start: 2023-12-15 — End: 2023-12-15
  Administered 2023-12-15: 325 mg via ORAL
  Filled 2023-12-15: qty 1

## 2023-12-15 MED ORDER — POTASSIUM CHLORIDE CRYS ER 20 MEQ PO TBCR
40.0000 meq | EXTENDED_RELEASE_TABLET | Freq: Once | ORAL | Status: AC
  Administered 2023-12-15: 40 meq via ORAL
  Filled 2023-12-15: qty 2

## 2023-12-15 MED ORDER — ATORVASTATIN CALCIUM 40 MG PO TABS
80.0000 mg | ORAL_TABLET | Freq: Every day | ORAL | Status: DC
  Administered 2023-12-16: 80 mg via ORAL
  Filled 2023-12-15: qty 2

## 2023-12-15 MED ORDER — SODIUM CHLORIDE 0.9 % IV BOLUS
1000.0000 mL | Freq: Once | INTRAVENOUS | Status: AC
  Administered 2023-12-15: 1000 mL via INTRAVENOUS

## 2023-12-15 MED ORDER — BRIMONIDINE TARTRATE 0.2 % OP SOLN
1.0000 [drp] | Freq: Three times a day (TID) | OPHTHALMIC | Status: DC
  Administered 2023-12-15 – 2023-12-16 (×2): 1 [drp] via OPHTHALMIC
  Filled 2023-12-15 (×2): qty 5

## 2023-12-15 MED ORDER — METOPROLOL TARTRATE 25 MG PO TABS
25.0000 mg | ORAL_TABLET | Freq: Two times a day (BID) | ORAL | Status: DC
  Administered 2023-12-15 – 2023-12-16 (×3): 25 mg via ORAL
  Filled 2023-12-15 (×3): qty 1

## 2023-12-15 MED ORDER — ASPIRIN 81 MG PO CHEW
81.0000 mg | CHEWABLE_TABLET | ORAL | Status: AC
  Administered 2023-12-16: 81 mg via ORAL
  Filled 2023-12-15: qty 1

## 2023-12-15 MED ORDER — ATORVASTATIN CALCIUM 10 MG PO TABS
20.0000 mg | ORAL_TABLET | Freq: Every day | ORAL | Status: DC
  Administered 2023-12-15: 20 mg via ORAL
  Filled 2023-12-15: qty 2

## 2023-12-15 MED ORDER — MORPHINE SULFATE (PF) 2 MG/ML IV SOLN: 2.0000 mg | INTRAVENOUS | Status: DC | PRN

## 2023-12-15 MED ORDER — DORZOLAMIDE HCL-TIMOLOL MAL 2-0.5 % OP SOLN
1.0000 [drp] | Freq: Two times a day (BID) | OPHTHALMIC | Status: DC
  Administered 2023-12-15 – 2023-12-17 (×4): 1 [drp] via OPHTHALMIC
  Filled 2023-12-15 (×2): qty 10

## 2023-12-15 NOTE — Plan of Care (Signed)

## 2023-12-15 NOTE — H&P (Addendum)
 History and Physical  Paige Foster ZOX:[id number] DOB: June 12, 1966 DOA: 12/15/2023  PCP: Kandace Organ, NP   Chief Complaint: Dizziness, nausea  HPI: Paige Foster is a 58 y.o. female with medical history significant for breast cancer on chemotherapy, hypertension being admitted to the hospital with concern for NSTEMI.  Patient states that she has had some dizziness, weakness and nausea intermittently for the last 24 hours or so, nausea got worse this morning so she came to the ER for evaluation.  States that she has not taken her blood pressure medications for the last 4 days.  She denies any chest pain, has denied this on multiple occasions while in the ER this morning.  Workup as detailed below concerning for no acute EKG changes, but she does have a rising troponin.  Patient has been started on IV heparin  drip, cardiology has been consulted, and hospitalist was contacted for observation admission.  Review of Systems: Please see HPI for pertinent positives and negatives. A complete 10 system review of systems are otherwise negative.  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    Port-A-Cath in place 09/28/2018   Recurrent breast cancer, left [facility]) oncologist-- dr Charolett Copes    dx 2004, noninvasive Stage 0 ----s/p left mastectomy w/ tram flap construction (and right breast reduction), taken Tamoxifen for 3 yrs;   08-30-2018 recurrent left cancer , Grade III,  cT1c,  ER positive, PR negative, HER-2 positive, invasive ductal carcinoma-- neoadjuvant chemo to start 09-26-2018   Renal artery stenosis (HCC)    mild right external renal artery stenosis per duplex in epic 08-09-2013   Wears glasses    Past Surgical History:  Procedure Laterality Date   BREAST LUMPECTOMY WITH RADIOACTIVE SEED LOCALIZATION Left 02/20/2019    Procedure: LEFT BREAST LUMPECTOMY WITH RADIOACTIVE SEED LOCALIZATION;  Surgeon: Enid Harry, MD;  Location: [facility] OR;  Service: General;  Laterality: Left;   BREAST SURGERY Left    Transflap   COLONOSCOPY     COLONOSCOPY     IR IMAGING GUIDED PORT INSERTION  11/25/2023   MASTECTOMY Left 2004   w/  TRAM flap construction and right breast augmentation with abdominoplasy   PARS PLANA VITRECTOMY Left 12/18/2018   Procedure: PARS PLANA VITRECTOMY WITH 25 GAUGE, ENDOLASER;  Surgeon: Jearline Minder, MD;  Location: [facility] OR;  Service: Ophthalmology;  Laterality: Left;   PORTACATH PLACEMENT N/A 09/25/2018   Procedure: INSERTION PORT-A-CATH WITH ULTRASOUND;  Surgeon: Enid Harry, MD;  Location: [facility] ORS;  Service: General;  Laterality: N/A;   TUBAL LIGATION Bilateral yrs ago   Social History:  reports that she has never smoked. She has never been exposed to tobacco smoke. She has never used smokeless tobacco. She reports that she does not currently use alcohol. She reports that she does not use drugs.  Allergies  Allergen Reactions   Bee Pollen Itching    Watery eyes and nose running    Family History  Problem Relation Age of Onset   Diabetes Mother    Hypertension Mother    Kidney disease Father    Colon cancer Neg Hx    Colon polyps Neg Hx    Gallbladder disease Neg Hx    Heart disease Neg Hx    Esophageal cancer Neg Hx  Stomach cancer Neg Hx    Rectal cancer Neg Hx      Prior to Admission medications   Medication Sig Start Date End Date Taking? Authorizing Provider  ondansetron  (ZOFRAN ) 8 MG tablet Take 1 tablet (8 mg total) by mouth every 8 (eight) hours as needed for nausea or vomiting. 11/28/23   Colie Dawes, MD  alendronate  (FOSAMAX ) 70 MG tablet Take 1 tablet (70 mg total) by mouth every 7 (seven) days. Take with a full glass of water  on an empty stomach. Patient not taking: Reported on 11/18/2023 09/30/23   Nche, Connye Delaine, NP  amLODipine  (NORVASC ) 10 MG tablet Take 1  tablet by mouth daily. 09/30/23   Nche, Connye Delaine, NP  anastrozole  (ARIMIDEX ) 1 MG tablet TAKE 1 TABLET(1 MG) BY MOUTH DAILY 09/23/23   Iruku, Praveena, MD  atorvastatin  (LIPITOR) 20 MG tablet Take 1 tablet (20 mg total) by mouth daily. 10/28/22   Nche, Connye Delaine, NP  brimonidine  (ALPHAGAN ) 0.2 % ophthalmic solution SMARTSIG:In Eye(s) 05/18/23   [provider]  dexamethasone  (DECADRON ) 4 MG tablet Take 2 tabs by mouth 2 times daily starting day before chemo. Then take 2 tabs daily for 2 days starting day after chemo. Take with food. 11/29/23   Iruku, Praveena, MD  dorzolamide -timolol  (COSOPT ) 2-0.5 % ophthalmic solution 1 drop 2 (two) times daily. 06/06/23   [provider]  lidocaine -prilocaine  (EMLA ) cream Apply to affected area 1 hour before access prn 11/29/23   Iruku, Praveena, MD  losartan  (COZAAR ) 50 MG tablet TAKE 1 TABLET(50 MG) BY MOUTH DAILY 09/30/23   Nche, Connye Delaine, NP  ondansetron  (ZOFRAN ) 8 MG tablet Take 1 tablet (8 mg total) by mouth every 8 (eight) hours as needed for nausea or vomiting. 11/29/23   Iruku, Praveena, MD  prochlorperazine  (COMPAZINE ) 10 MG tablet Take 1 tablet (10 mg total) by mouth every 6 (six) hours as needed for nausea or vomiting. 11/29/23   Murleen Arms, MD    Physical Exam: BP 134/86 (BP Location: Right Arm)   Pulse 95   Temp 99 F (37.2 C) (Oral)   Resp (!) 22   Ht 5\' 4"  (1.626 m)   Wt 54.4 kg   LMP 06/08/2007 Comment: Just stopped having period  SpO2 98%   BMI 20.60 kg/m  General:  Alert, oriented, calm, in no acute distress, multiple family members at the bedside Cardiovascular: Tachycardic and regular, no murmurs or rubs, no peripheral edema  Respiratory: clear to auscultation bilaterally, no wheezes, no crackles  Abdomen: soft, nontender, nondistended, normal bowel tones heard  Skin: dry, no rashes  Musculoskeletal: no joint effusions, normal range of motion  Psychiatric: appropriate affect, normal speech  Neurologic:  extraocular muscles intact, clear speech, moving all extremities with intact sensorium         Labs on Admission:  Basic Metabolic Panel: Recent Labs  Lab 12/15/23 0653 12/15/23 0828  NA 130*  --   K 2.6*  --   CL 99  --   CO2 23  --   GLUCOSE 172*  --   BUN 7  --   CREATININE 0.59  --   CALCIUM  8.5*  --   MG  --  1.7   Liver Function Tests: Recent Labs  Lab 12/15/23 0828  AST 33  ALT 26  ALKPHOS 169*  BILITOT 0.3  PROT 6.2*  ALBUMIN 3.2*   Recent Labs  Lab 12/15/23 0653  LIPASE 40   No results for input(s): "AMMONIA" in the last  168 hours. CBC: Recent Labs  Lab 12/15/23 0653  WBC 18.0*  NEUTROABS 15.0*  HGB 9.7*  HCT 28.5*  MCV 91.6  PLT 153   Cardiac Enzymes: No results for input(s): "CKTOTAL", "CKMB", "CKMBINDEX", "TROPONINI" in the last 168 hours. BNP (last 3 results) No results for input(s): "BNP" in the last 8760 hours.  ProBNP (last 3 results) No results for input(s): "PROBNP" in the last 8760 hours.  CBG: No results for input(s): "GLUCAP" in the last 168 hours.  Radiological Exams on Admission: DG Chest 2 View Result Date: 12/15/2023 CLINICAL DATA:  Shortness of breath. EXAM: CHEST - 2 VIEW COMPARISON:  10/17/2023 FINDINGS: Right chest wall port a catheter is noted with tip in the superior cavoatrial junction. Heart size and mediastinal contours are unremarkable. Mildly increased interstitial opacities identified throughout both lungs. No pleural fluid. Left lateral destructive rib lesion is again noted. Surgical clips identified in the left axilla. IMPRESSION: 1. Mildly increased interstitial opacities throughout both lungs. Differential considerations include mild interstitial edema versus atypical infection. 2. Left lateral destructive rib lesion. Electronically Signed   By: Kimberley Penman M.D.   On: 12/15/2023 07:33   Assessment/Plan Paige Foster is a 58 y.o. female with medical history significant for breast cancer on chemotherapy,  hypertension being admitted to the hospital with concern for NSTEMI.   NSTEMI-concern due to dizziness, nausea, elevated troponin.  EKG and telemetry personally reviewed, no acute changes. -Observation admission -Cardiac telemetry -IV heparin  drip -Continue to trend troponin -Will give ASA 325 p.o. x 1 now -Intensify atorvastatin  to 80 -Start Lopressor 25 p.o. twice daily per cardiology -Check stat EKG, IV morphine  in case of chest pain -Has been seen by cardiology, their guidance is greatly appreciated  Tachycardia-given history of malignancy and tachycardia concern for PE, cardiology has ordered CT angio chest.  Hypokalemia-patient states she has been having diarrhea since Monday, but it is improved.  Magnesium level normal.  Potassium repleted, will recheck potassium level in the morning.  Hypertension-continue home amlodipine   Metastatic breast cancer-under the care of Dr. Arno Bibles, recently started back on docetaxel  -Oncology has been added to treatment team    Code Status: Full Code  Consults called: Cardiology  Admission status: Observation  Time spent: 48 minutes  Paige Foster Charm MD [facility] Pager [phone]  If 7PM-7AM, please contact night-coverage [email or web address] Password [email or web address]  12/15/2023, 11:39 AM

## 2023-12-15 NOTE — ED Notes (Signed)
Pt ambulatory to bathroom with standby assistance 

## 2023-12-15 NOTE — Progress Notes (Signed)
 Pharmacy: [facility]    Patient is a 58 y.o with breast cancer on chemotherapy who presented to the ED on 12/15/23 with c/o nausea, dizziness and weakness. She was found to have elevated troponin and is now on heparin  drip for NSTEMI. Plan is for Left heart cath on 5/23.  - First heparin  level collected at 8p is <0.1 - per RN , no bleeding and no interruptions  Goal of Therapy:  Heparin  level 0.3-0.7 units/ml Monitor platelets by anticoagulation protocol: Yes   Plan: -bolus heparin  1500 units x 1 -Increase heparin  drip to 850 units/hr - check  6 hr heparin  level - monitor for s/sx bleeding   Beau Bound RPh 12/15/2023, 10:40 PM

## 2023-12-15 NOTE — ED Notes (Signed)
 Cardiologist, PA at bedside

## 2023-12-15 NOTE — Progress Notes (Signed)
 PHARMACY - ANTICOAGULATION CONSULT NOTE  Pharmacy Consult for Heparin  Indication: NSTEMI  Allergies  Allergen Reactions   Bee Pollen Itching    Watery eyes and nose running    Patient Measurements: Height: 5\' 4"  (162.6 cm) Weight: 54.4 kg (120 lb) IBW/kg (Calculated) : 54.7 HEPARIN  DW (KG): 54.4  Vital Signs: Temp: 99 F (37.2 C) (05/22 1000) Temp Source: Oral (05/22 1000) BP: 134/86 (05/22 1000) Pulse Rate: 95 (05/22 1000)  Labs: Recent Labs    12/15/23 0653 12/15/23 0828  HGB 9.7*  --   HCT 28.5*  --   PLT 153  --   CREATININE 0.59  --   TROPONINIHS 85* 401*    Estimated Creatinine Clearance: 65 mL/min (by C-G formula based on SCr of 0.59 mg/dL).   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    Port-A-Cath in place 09/28/2018   Recurrent breast cancer, left [facility]) oncologist-- dr Charolett Copes    dx 2004, noninvasive Stage 0 ----s/p left mastectomy w/ tram flap construction (and right breast reduction), taken Tamoxifen for 3 yrs;   08-30-2018 recurrent left cancer , Grade III,  cT1c,  ER positive, PR negative, HER-2 positive, invasive ductal carcinoma-- neoadjuvant chemo to start 09-26-2018   Renal artery stenosis (HCC)    mild right external renal artery stenosis per duplex in epic 08-09-2013   Wears glasses    Assessment: Active Problem(s): dizziness, weakness, and nausea x2hours. Has not taken BP meds since Sunday.   AC/Heme: NSTEMI.+troponins.  Start IV heparin . No anticoag. PTA. Baseline Hgb only 9.7, Plts 153  Goal of Therapy:  Heparin  level 0.3-0.7 units/ml Monitor platelets by anticoagulation protocol: Yes   Plan:  Heparin  3000 unit IV bolus Heparin  650 units/hr Check heparin  level in 8 hrs. Daily HL and CBC   Yomaris Palecek Darcel Early, PharmD, BCPS Clinical  Staff Pharmacist Enis Harsh Stillinger 12/15/2023,11:16 AM

## 2023-12-15 NOTE — ED Provider Notes (Addendum)
 [place] EMERGENCY DEPARTMENT AT [facility] Provider Note   CSN: [id number] Arrival date & time: 12/15/23  8295     History Chief Complaint  Patient presents with   Nausea   Weakness   Dizziness    Paige Foster is a 58 y.o. female with history of breast cancer who presents to the emergency department today for further evaluation of episode of lightheadedness and dizziness that occurred approximately at 2 AM.  Patient states she was up and about and states that she had something very salty to eat.  She states that she did not take her blood pressure medications and has not for a few days because her blood pressure has been running low.  She denies chest pain but does endorse some shortness of breath.  She denies nausea, vomiting, diarrhea, urinary symptoms, fever, chills. Patient states she is currently improving.    Weakness Associated symptoms: dizziness   Dizziness Associated symptoms: weakness        Home Medications Prior to Admission medications   Medication Sig Start Date End Date Taking? Authorizing Provider  ondansetron  (ZOFRAN ) 8 MG tablet Take 1 tablet (8 mg total) by mouth every 8 (eight) hours as needed for nausea or vomiting. 11/28/23   Colie Dawes, MD  alendronate  (FOSAMAX ) 70 MG tablet Take 1 tablet (70 mg total) by mouth every 7 (seven) days. Take with a full glass of water  on an empty stomach. Patient not taking: Reported on 11/18/2023 09/30/23   Nche, Connye Delaine, NP  amLODipine  (NORVASC ) 10 MG tablet Take 1 tablet by mouth daily. 09/30/23   Nche, Connye Delaine, NP  anastrozole  (ARIMIDEX ) 1 MG tablet TAKE 1 TABLET(1 MG) BY MOUTH DAILY 09/23/23   Iruku, Praveena, MD  atorvastatin  (LIPITOR) 20 MG tablet Take 1 tablet (20 mg total) by mouth daily. 10/28/22   Nche, Connye Delaine, NP  brimonidine  (ALPHAGAN ) 0.2 % ophthalmic solution SMARTSIG:In Eye(s) 05/18/23   [provider]  dexamethasone  (DECADRON ) 4 MG tablet Take 2 tabs by mouth 2 times  daily starting day before chemo. Then take 2 tabs daily for 2 days starting day after chemo. Take with food. 11/29/23   Iruku, Praveena, MD  dorzolamide -timolol  (COSOPT ) 2-0.5 % ophthalmic solution 1 drop 2 (two) times daily. 06/06/23   [provider]  lidocaine -prilocaine  (EMLA ) cream Apply to affected area 1 hour before access prn 11/29/23   Iruku, Praveena, MD  losartan  (COZAAR ) 50 MG tablet TAKE 1 TABLET(50 MG) BY MOUTH DAILY 09/30/23   Nche, Connye Delaine, NP  ondansetron  (ZOFRAN ) 8 MG tablet Take 1 tablet (8 mg total) by mouth every 8 (eight) hours as needed for nausea or vomiting. 11/29/23   Iruku, Praveena, MD  prochlorperazine  (COMPAZINE ) 10 MG tablet Take 1 tablet (10 mg total) by mouth every 6 (six) hours as needed for nausea or vomiting. 11/29/23   Iruku, Praveena, MD      Allergies    Bee pollen    Review of Systems   Review of Systems  Neurological:  Positive for dizziness and weakness.  All other systems reviewed and are negative.   Physical Exam Updated Vital Signs BP 134/86 (BP Location: Right Arm)   Pulse 95   Temp 99 F (37.2 C) (Oral)   Resp (!) 22   Ht 5\' 4"  (1.626 m)   Wt 54.4 kg   LMP 06/08/2007 Comment: Just stopped having period  SpO2 98%   BMI 20.60 kg/m  Physical Exam Vitals and nursing note reviewed.  Constitutional:      General: She is not in acute distress.    Appearance: Normal appearance.  HENT:     Head: Normocephalic and atraumatic.  Eyes:     General:        Right eye: No discharge.        Left eye: No discharge.     Conjunctiva/sclera: Conjunctivae normal.  Cardiovascular:     Heart sounds: Murmur heard.     Systolic murmur is present with a grade of 2/6.     Comments: Regular rate and rhythm. Radial pulses are 2+ bilaterally.  Dorsalis pedis pulses are 2+ bilaterally.  No evidence of pedal edema. Pulmonary:     Effort: Pulmonary effort is normal.     Comments: Clear to auscultation bilaterally.  Normal effort.  No respiratory  distress.  No evidence of wheezes, rales, or rhonchi heard throughout. Abdominal:     General: Abdomen is flat. Bowel sounds are normal. There is no distension.     Tenderness: There is no abdominal tenderness. There is no guarding or rebound.  Musculoskeletal:        General: Normal range of motion.     Cervical back: Neck supple.  Skin:    General: Skin is warm and dry.     Findings: No rash.  Neurological:     General: No focal deficit present.     Mental Status: She is alert.     Cranial Nerves: No cranial nerve deficit, dysarthria or facial asymmetry.     Motor: No weakness.  Psychiatric:        Mood and Affect: Mood normal.        Behavior: Behavior normal.     ED Results / Procedures / Treatments   Labs (all labs ordered are listed, but only abnormal results are displayed) Labs Reviewed  CBC WITH DIFFERENTIAL/PLATELET - Abnormal; Notable for the following components:      Result Value   WBC 18.0 (*)    RBC 3.11 (*)    Hemoglobin 9.7 (*)    HCT 28.5 (*)    Neutro Abs 15.0 (*)    Monocytes Absolute 1.2 (*)    Abs Immature Granulocytes 0.80 (*)    All other components within normal limits  BASIC METABOLIC PANEL WITH GFR - Abnormal; Notable for the following components:   Sodium 130 (*)    Potassium 2.6 (*)    Glucose, Bld 172 (*)    Calcium  8.5 (*)    All other components within normal limits  HEPATIC FUNCTION PANEL - Abnormal; Notable for the following components:   Total Protein 6.2 (*)    Albumin 3.2 (*)    Alkaline Phosphatase 169 (*)    Indirect Bilirubin 0.2 (*)    All other components within normal limits  TROPONIN I (HIGH SENSITIVITY) - Abnormal; Notable for the following components:   Troponin I (High Sensitivity) 85 (*)    All other components within normal limits  TROPONIN I (HIGH SENSITIVITY) - Abnormal; Notable for the following components:   Troponin I (High Sensitivity) 401 (*)    All other components within normal limits  LIPASE, BLOOD   MAGNESIUM  URINALYSIS, ROUTINE W REFLEX MICROSCOPIC  HEPARIN  LEVEL (UNFRACTIONATED)    EKG EKG Interpretation Date/Time:  Thursday Dec 15 2023 06:47:28 EDT Ventricular Rate:  98 PR Interval:  134 QRS Duration:  93 QT Interval:  340 QTC Calculation: 435 R Axis:   34  Text Interpretation: Sinus tachycardia Ventricular premature complex  Confirmed by Cheyenne Cotta [phone]) on 12/15/2023 10:55:18 AM  Radiology DG Chest 2 View Result Date: 12/15/2023 CLINICAL DATA:  Shortness of breath. EXAM: CHEST - 2 VIEW COMPARISON:  10/17/2023 FINDINGS: Right chest wall port a catheter is noted with tip in the superior cavoatrial junction. Heart size and mediastinal contours are unremarkable. Mildly increased interstitial opacities identified throughout both lungs. No pleural fluid. Left lateral destructive rib lesion is again noted. Surgical clips identified in the left axilla. IMPRESSION: 1. Mildly increased interstitial opacities throughout both lungs. Differential considerations include mild interstitial edema versus atypical infection. 2. Left lateral destructive rib lesion. Electronically Signed   By: Kimberley Penman M.D.   On: 12/15/2023 07:33    Procedures .Critical Care  Performed by: Darletta Ehrich, PA-C Authorized by: Darletta Ehrich, PA-C   Critical care provider statement:    Critical care time (minutes):  35   Critical care time was exclusive of:  Separately billable procedures and treating other patients   Critical care was necessary to treat or prevent imminent or life-threatening deterioration of the following conditions:  Cardiac failure   Critical care was time spent personally by me on the following activities:  Blood draw for specimens, discussions with consultants, development of treatment plan with patient or surrogate, ordering and performing treatments and interventions, ordering and review of laboratory studies, ordering and review of radiographic studies, pulse oximetry  and re-evaluation of patient's condition     Medications Ordered in ED Medications  potassium chloride  10 mEq in 100 mL IVPB (10 mEq Intravenous New Bag/Given 12/15/23 1057)  heparin  bolus via infusion 3,000 Units (has no administration in time range)  heparin  ADULT infusion 100 units/mL (25000 units/250mL) (has no administration in time range)  atorvastatin  (LIPITOR) tablet 20 mg (has no administration in time range)  amLODipine  (NORVASC ) tablet 10 mg (has no administration in time range)  acetaminophen  (TYLENOL ) tablet 650 mg (has no administration in time range)    Or  acetaminophen  (TYLENOL ) suppository 650 mg (has no administration in time range)  morphine  (PF) 2 MG/ML injection 2 mg (has no administration in time range)  ondansetron  (ZOFRAN ) tablet 4 mg (has no administration in time range)    Or  ondansetron  (ZOFRAN ) injection 4 mg (has no administration in time range)  aspirin tablet 325 mg (has no administration in time range)  sodium chloride  0.9 % bolus 1,000 mL (1,000 mLs Intravenous Bolus 12/15/23 0654)  potassium chloride  SA (KLOR-CON  M) CR tablet 40 mEq (40 mEq Oral Given 12/15/23 0853)    ED Course/ Medical Decision Making/ A&P Clinical Course as of 12/15/23 1144  Thu Dec 15, 2023  1114 Spoke with Dr. Suzann Ernst with cardiology.  Plan to admit to medicine and they will see in consult.  Patient will likely need an echo and possible cath.  Will start on heparin . [CF]  1134 I spoke with Dr. Jannette Mend with [facility] hospitalist who agrees to admit the patient. [CF]  1140 Hepatic function panel(!) Alk phos elevated in the setting of cancer. [CF]  1140 Troponin I (High Sensitivity)(!!) Initial and delta troponin are uptrending and elevated above normal limits.  Without any significant EKG signs will treat likely for NSTEMI. [CF]  1140 Basic metabolic panel(!!) Pretty significant hypokalemia.  Hyponatremia elevated glucose. [CF]  1141 Magnesium Mag is normal. [CF]  1141 Lipase,  blood Negative. [CF]  1141 CBC with Differential(!) There is evidence of leukocytosis.  There is anemia which is worsened than patient's baseline. [CF]  1141 DG  Chest 2 View I personally ordered and interpreted the study and do not see any evidence of pneumothorax, cardiomegaly.  No evidence of pneumonia.  I do agree with radiologist dictation. [CF]    Clinical Course User Index [CF] Darletta Ehrich, PA-C   {   Click here for ABCD2, HEART and other calculators  Medical Decision Making JALEISA BROSE is a 58 y.o. female patient who presents to the emergency department today for further evaluation of episode of lightheadedness.  This is since resolved. Neuro exam is intact. Vital signs normal. Will start with fluids and basic labs. Plan to get EKG and chest xray for cardiac causes of her lightheadedness.   Patient's troponin is continuing to increase.  Will treat for NSTEMI concern there is no significant EKG changes.  Patient without chest pain.  Heparin  ordered.  Replating her potassium.  Cardiology to consult.  Patient will be admitted to hospitalist.  She is stable for admission at this time.  Amount and/or Complexity of Data Reviewed Labs: ordered. Decision-making details documented in ED Course. Radiology: ordered. Decision-making details documented in ED Course.  Risk Prescription drug management. Decision regarding hospitalization.    Final Clinical Impression(s) / ED Diagnoses Final diagnoses:  NSTEMI (non-ST elevated myocardial infarction) [facility])    Rx / DC Orders ED Discharge Orders     None         Darletta Ehrich, PA-C 12/15/23 1144    Angelyn Kennel Ewing, PA-C 12/15/23 1431    Cheyenne Cotta, MD 12/16/23 1040

## 2023-12-15 NOTE — Consult Note (Addendum)
 Cardiology Consultation   Patient ID: CLARIS PECH MRN: [id number]; DOB: May 01, 1966  Admit date: 12/15/2023 Date of Consult: 12/15/2023  PCP:  Kandace Organ, NP   Paige Foster Providers Cardiologist:  None   {  Patient Profile:   Paige Foster is a 58 y.o. 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 Jannette Mend.  History of Present Illness:   [name] patient without any prior cardiac history.  Denies any family history of cardiac disease.  Does not smoke, drink, do drugs.  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  She was just seen April 25 of this year.  Looks like she was started on chemotherapy again.  Also echocardiogram recently ordered as part of her workup, with preserved biventricular function with moderate TR.  Currently patient being evaluated for NSTEMI.  She initially presented with lightheaded and dizziness that occurred this morning around 2 AM.  Has had no complaints of chest pain or shortness of breath.  No decrease in exercise capacity.  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  Also reports not sleeping at all for the last 2 hours.  Otherwise does not report any significant shortness of breath, peripheral edema, palpitations, presyncope.  No nausea or vomiting.  Although she does have a potassium of 2.6.  Tachycardic heart rates between 100-120.  Troponins 85-401.  Started on IV heparin  and cardiology consulted.  Sodium 130, potassium 4.6.  Creatinine 0.59.  Lipase 40.  WBC 18.  Hemoglobin 9.7.  Elevated neutrophil count 15.  Chest x-ray with interstitial edema versus atypical infection.  Left lateral destructive rib lesion.  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    Port-A-Cath in place 09/28/2018   Recurrent breast cancer, left [facility]) oncologist-- dr Charolett Copes    dx 2004, noninvasive Stage 0 ----s/p left mastectomy w/ tram flap construction (and right breast reduction), taken Tamoxifen for 3 yrs;   08-30-2018 recurrent left cancer , Grade III,  cT1c,  ER positive, PR negative, HER-2 positive, invasive ductal carcinoma-- neoadjuvant chemo to start 09-26-2018   Renal artery stenosis (HCC)    mild right external renal artery stenosis per duplex in epic 08-09-2013   Wears glasses     Past Surgical History:  Procedure Laterality Date   BREAST LUMPECTOMY WITH RADIOACTIVE SEED LOCALIZATION Left 02/20/2019   Procedure: LEFT BREAST LUMPECTOMY WITH RADIOACTIVE SEED LOCALIZATION;  Surgeon: Enid Harry, MD;  Location: [facility] OR;  Service: General;  Laterality: Left;   BREAST SURGERY Left    Transflap   COLONOSCOPY     COLONOSCOPY     IR IMAGING GUIDED PORT INSERTION  11/25/2023   MASTECTOMY Left 2004   w/  TRAM flap construction and right breast augmentation with abdominoplasy   PARS PLANA VITRECTOMY Left 12/18/2018   Procedure: PARS PLANA VITRECTOMY WITH 25 GAUGE, ENDOLASER;  Surgeon: Jearline Minder, MD;  Location: [facility] OR;  Service: Ophthalmology;  Laterality: Left;   PORTACATH PLACEMENT N/A 09/25/2018  Procedure: INSERTION PORT-A-CATH WITH ULTRASOUND;  Surgeon: Enid Harry, MD;  Location: [facility] ORS;  Service: General;  Laterality: N/A;   TUBAL LIGATION Bilateral yrs ago    Inpatient Medications: Scheduled Meds:  [START ON 12/16/2023] amLODipine   10 mg Oral Daily   atorvastatin   20 mg Oral Daily   Continuous Infusions:  heparin  650 Units/hr (12/15/23 1153)   PRN Meds: acetaminophen  **OR** acetaminophen , morphine  injection, ondansetron  **OR** ondansetron   (ZOFRAN ) IV  Allergies:    Allergies  Allergen Reactions   Bee Pollen Itching    Watery eyes and nose running    Social History:   Social History   Socioeconomic History   Marital status: Married    Spouse name: Not on file   Number of children: 2   Years of education: Not on file   Highest education level: Not on file  Occupational History   Occupation: [employer]  Tobacco Use   Smoking status: Never    Passive exposure: Never   Smokeless tobacco: Never  Vaping Use   Vaping status: Never Used  Substance and Sexual Activity   Alcohol use: Not Currently    Alcohol/week: 0.0 standard drinks of alcohol    Comment: Occassionally   Drug use: No   Sexual activity: Not on file  Other Topics Concern   Not on file  Social History Narrative   Not on file   Social Drivers of Health   Financial Resource Strain: Low Risk  (10/27/2022)   Overall Financial Resource Strain (CARDIA)    Difficulty of Paying Living Expenses: Not hard at all  Food Insecurity: No Food Insecurity (11/04/2023)   Hunger Vital Sign    Worried About Running Out of Food in the Last Year: Never true    Ran Out of Food in the Last Year: Never true  Transportation Needs: No Transportation Needs (11/04/2023)   PRAPARE - Administrator, Civil Service (Medical): No    Lack of Transportation (Non-Medical): No  Physical Activity: Insufficiently Active (10/27/2022)   Exercise Vital Sign    Days of Exercise per Week: 7 days    Minutes of Exercise per Session: 20 min  Stress: No Stress Concern Present (10/27/2022)   [employer] of Occupational Health - Occupational Stress Questionnaire    Feeling of Stress : Not at all  Social Connections: Moderately Isolated (10/27/2022)   Social Connection and Isolation Panel [NHANES]    Frequency of Communication with Friends and Family: More than three times a week    Frequency of Social Gatherings with Friends and Family: More than three times a week    Attends  Religious Services: Never    Database administrator or Organizations: No    Attends Banker Meetings: Never    Marital Status: Married  Catering manager Violence: Not At Risk (11/04/2023)   Humiliation, Afraid, Rape, and Kick questionnaire    Fear of Current or Ex-Partner: No    Emotionally Abused: No    Physically Abused: No    Sexually Abused: No    Family History:   Family History  Problem Relation Age of Onset   Diabetes Mother    Hypertension Mother    Kidney disease Father    Colon cancer Neg Hx    Colon polyps Neg Hx    Gallbladder disease Neg Hx    Heart disease Neg Hx    Esophageal cancer Neg Hx    Stomach cancer Neg Hx    Rectal cancer Neg Hx  ROS:  Please see the history of present illness.  All other ROS reviewed and negative.     Physical Exam/Data:   Vitals:   12/15/23 0900 12/15/23 1000 12/15/23 1130 12/15/23 1200  BP: (!) 133/91 134/86 (!) 150/98 (!) 138/93  Pulse: 96 95 (!) 104 (!) 112  Resp: 18 (!) 22 (!) 25 (!) 21  Temp:  99 F (37.2 C)    TempSrc:  Oral    SpO2: 98% 98% 98% 100%  Weight:      Height:       No intake or output data in the 24 hours ending 12/15/23 1228    12/15/2023    6:39 AM 12/02/2023    9:06 AM 11/25/2023    7:39 AM  Last 3 Weights  Weight (lbs) 120 lb 125 lb 127 lb  Weight (kg) 54.432 kg 56.7 kg 57.607 kg     Body mass index is 20.6 kg/m.  General:  Well nourished, well developed, in no acute distress HEENT: normal Neck: Positive JVD Vascular: No carotid bruits; Distal pulses 2+ bilaterally Cardiac:  normal S1, S2; RRR; 2 out of 6 murmur left sternal border Lungs:  clear to auscultation bilaterally, no wheezing, rhonchi or rales  Abd: soft, nontender, no hepatomegaly  Ext: Mild edema Musculoskeletal:  No deformities, BUE and BLE strength normal and equal Skin: warm and dry  Neuro:  CNs 2-12 intact, no focal abnormalities noted Psych:  Normal affect   EKG:  The EKG was personally reviewed and  demonstrates: Sinus rhythm heart rate 92.  No acute ST-T wave changes. Telemetry:  Telemetry was personally reviewed and demonstrates: Sinus rhythm heart rates 100 -120  Relevant CV Studies: Echocardiogram 11/29/2023 1. Left ventricular ejection fraction, by estimation, is 60 to 65%. Left  ventricular ejection fraction by 3D volume is 61 %. The left ventricle has  normal function. The left ventricle has no regional wall motion  abnormalities. There is mild left  ventricular hypertrophy of the basal-septal segment. Left ventricular  diastolic parameters were normal. The average left ventricular global  longitudinal strain is -18.9 %. The global longitudinal strain is normal.   2. Right ventricular systolic function is normal. The right ventricular  size is normal. There is normal pulmonary artery systolic pressure. The  estimated right ventricular systolic pressure is 31.5 mmHg.   3. Left atrial size was mildly dilated.   4. The mitral valve is normal in structure. Trivial mitral valve  regurgitation. No evidence of mitral stenosis.   5. Tricuspid valve regurgitation is moderate.   6. The aortic valve is normal in structure. Aortic valve regurgitation is  not visualized. No aortic stenosis is present.   7. The inferior vena cava is normal in size with greater than 50%  respiratory variability, suggesting right atrial pressure of 3 mmHg.   Comparison(s): EF 60%, GLS -19.7%.   Laboratory Data:  High Sensitivity Troponin:   Recent Labs  Lab 12/15/23 0653 12/15/23 0828  TROPONINIHS 85* 401*     Chemistry Recent Labs  Lab 12/15/23 0653 12/15/23 0828  NA 130*  --   K 2.6*  --   CL 99  --   CO2 23  --   GLUCOSE 172*  --   BUN 7  --   CREATININE 0.59  --   CALCIUM  8.5*  --   MG  --  1.7  GFRNONAA >60  --   ANIONGAP 8  --     Recent Labs  Lab 12/15/23 [phone]  PROT 6.2*  ALBUMIN 3.2*  AST 33  ALT 26  ALKPHOS 169*  BILITOT 0.3   Lipids No results for input(s): "CHOL",  "TRIG", "HDL", "LABVLDL", "LDLCALC", "CHOLHDL" in the last 168 hours.  Hematology Recent Labs  Lab 12/15/23 0653  WBC 18.0*  RBC 3.11*  HGB 9.7*  HCT 28.5*  MCV 91.6  MCH 31.2  MCHC 34.0  RDW 14.7  PLT 153   Thyroid  No results for input(s): "TSH", "FREET4" in the last 168 hours.  BNPNo results for input(s): "BNP", "PROBNP" in the last 168 hours.  DDimer No results for input(s): "DDIMER" in the last 168 hours.   Radiology/Studies:  DG Chest 2 View Result Date: 12/15/2023 CLINICAL DATA:  Shortness of breath. EXAM: CHEST - 2 VIEW COMPARISON:  10/17/2023 FINDINGS: Right chest wall port a catheter is noted with tip in the superior cavoatrial junction. Heart size and mediastinal contours are unremarkable. Mildly increased interstitial opacities identified throughout both lungs. No pleural fluid. Left lateral destructive rib lesion is again noted. Surgical clips identified in the left axilla. IMPRESSION: 1. Mildly increased interstitial opacities throughout both lungs. Differential considerations include mild interstitial edema versus atypical infection. 2. Left lateral destructive rib lesion. Electronically Signed   By: Kimberley Penman M.D.   On: 12/15/2023 07:33     Assessment and Plan:   NSTEMI Initially presented with dizziness and lightheadedness.  EKG with no acute ST-T wave changes.  Troponins went from 85-401, no chest pain or obvious anginal equivalents.  She has atypical presentation that does not clearly represent ACS although troponin elevation also without clear cause.  Does have elevated WBC neutrophil count possibly suggestive of secondary etiology. For now continue with IV heparin , aspirin, start Lopressor 25 mg twice daily, on atorvastatin  20 mg but will increase to 80 mg. Check lipid panel, will get limited echocardiogram, cycle another set of trops.   Will also order CTA to rule out PE given tachycardia.  This will also let us  look at coronary calcifications.  She is on  chemotherapy so and may be in somewhat of a hypercoagulable state.  If CTPA unremarkable and no clear cause of demand ischemia apparent, will plan to proceed with LHC tomorrow  Acute HFpEF Had formal earlier this month May 2025 showing preserved biventricular function with moderate TR.  Currently appears to be volume up but with her severe hypokalemia will not push diuresis Get limited echocardiogram  Hypokalemia She reports having decreased appetite but no obvious nausea and vomiting.  Does report diarrhea, suspect cause of low K. Potassium of 2.6. Continue to aggressively replete per primary team.  Magnesium 1.7.  Leukocytosis Anemia, considerable drop 11.6 earlier this month now 9.7. Per primary team  Tachycardia Likely related to electrolyte derangements.  TSH 1 month ago was normal.  Get CTA as above.   Risk Assessment/Risk Scores:   TIMI Risk Score for Unstable Angina or Non-ST Elevation MI:   The patient's TIMI risk score is 1, which indicates a 5% risk of all cause mortality, new or recurrent myocardial infarction or need for urgent revascularization in the next 14 days.{  New York  Heart Association (NYHA) Functional Class NYHA Class II   For questions or updates, please contact [place] [facility] Please consult [email or web address] for contact info under    Signed, Burnetta Cart, PA-C  12/15/2023 12:28 PM   Patient seen and examined.  Agree with above documentation.  Ms. Ebron is a 58 year old female with a history of breast cancer who we  are consulted by Dr. Jannette Mend for evaluation of NSTEMI.  She presented with lightheadedness that occurred early this morning.  Denied chest pain or dyspnea.  Initial vital signs notable for BP 134/89, pulse 95, SpO2 100% on room air.  Labs notable for troponin 85 > 401 > 616, sodium 130, potassium 2.6, creatinine 0.59, WBC 18, hemoglobin 9.7.  EKG shows sinus rhythm, rate 92, no ST abnormalities.  CTPA showed no PE but innumerable hepatic  metastasis, increased size of left anterolateral rib lesion, small pleural effusion.  On exam, patient is alert and oriented, regular rate and rhythm, no murmurs, lungs CTAB, no LE edema or JVD.  For her troponin elevation, she is denying any chest pain or dyspnea but no clear cause of demand ischemia to explain troponin elevation.  Will need to rule out type I MI.  Tentatively plan for [facility] tomorrow.  Started on heparin  drip, aspirin.  Her potassium is being repleted.  If hemoglobin stable on heparin  drip, will proceed with cath tomorrow.  Risks and benefits of cardiac catheterization have been discussed with the patient.  These include bleeding, infection, kidney damage, stroke, heart attack, death.  The patient understands these risks and is willing to proceed.  I did speak with her oncologist, Dr. Arno Bibles, and would not expect her counts to drop more than they have.  Agreed with proceeding with cath.  Wendie Hamburg, MD

## 2023-12-15 NOTE — ED Triage Notes (Signed)
 Pt BIBA from home with c/o dizziness, weakness, and nausea x2hours. Has not taken BP meds since Sunday. Last ate soup yesterday. Per EMS, stroke screen negative.  140/90- HR 100- RR 16- 98%RA- CBG 142

## 2023-12-16 ENCOUNTER — Encounter (HOSPITAL_COMMUNITY): Payer: Self-pay | Admitting: Cardiology

## 2023-12-16 ENCOUNTER — Observation Stay (HOSPITAL_COMMUNITY)

## 2023-12-16 ENCOUNTER — Encounter (HOSPITAL_COMMUNITY)
Admission: EM | Disposition: A | Discharge status: Home / Self Care | Payer: Self-pay | Source: Home / Self Care | Attending: Emergency Medicine

## 2023-12-16 DIAGNOSIS — I5032 Chronic diastolic (congestive) heart failure: Secondary | ICD-10-CM

## 2023-12-16 DIAGNOSIS — I214 Non-ST elevation (NSTEMI) myocardial infarction: Secondary | ICD-10-CM | POA: Diagnosis not present

## 2023-12-16 DIAGNOSIS — E876 Hypokalemia: Secondary | ICD-10-CM | POA: Diagnosis not present

## 2023-12-16 DIAGNOSIS — R7989 Other specified abnormal findings of blood chemistry: Secondary | ICD-10-CM

## 2023-12-16 HISTORY — PX: LEFT HEART CATH AND CORONARY ANGIOGRAPHY: CATH118249

## 2023-12-16 LAB — CBC
HCT: 30.8 % — ABNORMAL LOW (ref 36.0–46.0)
Hemoglobin: 10.3 g/dL — ABNORMAL LOW (ref 12.0–15.0)
MCH: 31.7 pg (ref 26.0–34.0)
MCHC: 33.4 g/dL (ref 30.0–36.0)
MCV: 94.8 fL (ref 80.0–100.0)
Platelets: 167 10*3/uL (ref 150–400)
RBC: 3.25 MIL/uL — ABNORMAL LOW (ref 3.87–5.11)
RDW: 15.7 % — ABNORMAL HIGH (ref 11.5–15.5)
WBC: 14 10*3/uL — ABNORMAL HIGH (ref 4.0–10.5)
nRBC: 0.4 % — ABNORMAL HIGH (ref 0.0–0.2)

## 2023-12-16 LAB — BASIC METABOLIC PANEL WITH GFR
Anion gap: 8 (ref 5–15)
BUN: 8 mg/dL (ref 6–20)
CO2: 21 mmol/L — ABNORMAL LOW (ref 22–32)
Calcium: 8.8 mg/dL — ABNORMAL LOW (ref 8.9–10.3)
Chloride: 109 mmol/L (ref 98–111)
Creatinine, Ser: 0.6 mg/dL (ref 0.44–1.00)
GFR, Estimated: >60 mL/min
Glucose, Bld: 101 mg/dL — ABNORMAL HIGH (ref 70–99)
Potassium: 3.7 mmol/L (ref 3.5–5.1)
Sodium: 138 mmol/L (ref 135–145)

## 2023-12-16 LAB — ECHOCARDIOGRAM LIMITED
Calc EF: 63.8 %
Height: 64 in
S' Lateral: 2.8 cm
Single Plane A2C EF: 65.4 %
Single Plane A4C EF: 67.3 %
Weight: 1908.3 [oz_av]

## 2023-12-16 LAB — HEPARIN LEVEL (UNFRACTIONATED): Heparin Unfractionated: 0.2 [IU]/mL — ABNORMAL LOW (ref 0.30–0.70)

## 2023-12-16 SURGERY — LEFT HEART CATH AND CORONARY ANGIOGRAPHY
Anesthesia: LOCAL

## 2023-12-16 MED ORDER — ENOXAPARIN SODIUM 40 MG/0.4ML IJ SOSY
40.0000 mg | PREFILLED_SYRINGE | INTRAMUSCULAR | Status: DC
  Administered 2023-12-17: 40 mg via SUBCUTANEOUS
  Filled 2023-12-16: qty 0.4

## 2023-12-16 MED ORDER — MIDAZOLAM HCL 2 MG/2ML IJ SOLN
INTRAMUSCULAR | Status: DC | PRN
  Administered 2023-12-16: 1 mg via INTRAVENOUS

## 2023-12-16 MED ORDER — METOPROLOL TARTRATE 5 MG/5ML IV SOLN: 5.0000 mg | INTRAVENOUS | Status: DC | PRN

## 2023-12-16 MED ORDER — VERAPAMIL HCL 2.5 MG/ML IV SOLN
INTRAVENOUS | Status: AC
  Filled 2023-12-16: qty 2

## 2023-12-16 MED ORDER — LIDOCAINE HCL (PF) 1 % IJ SOLN
INTRAMUSCULAR | Status: DC | PRN
  Administered 2023-12-16: 2 mL via INTRADERMAL

## 2023-12-16 MED ORDER — GUAIFENESIN 100 MG/5ML PO LIQD: 5.0000 mL | ORAL | Status: DC | PRN

## 2023-12-16 MED ORDER — SODIUM CHLORIDE 0.9% FLUSH: 3.0000 mL | Freq: Two times a day (BID) | INTRAVENOUS | Status: DC

## 2023-12-16 MED ORDER — HEPARIN (PORCINE) IN NACL 1000-0.9 UT/500ML-% IV SOLN
INTRAVENOUS | Status: DC | PRN
  Administered 2023-12-16 (×2): 500 mL

## 2023-12-16 MED ORDER — HEPARIN SODIUM (PORCINE) 1000 UNIT/ML IJ SOLN
INTRAMUSCULAR | Status: AC
  Filled 2023-12-16: qty 10

## 2023-12-16 MED ORDER — LIDOCAINE HCL (PF) 1 % IJ SOLN
INTRAMUSCULAR | Status: AC
  Filled 2023-12-16: qty 30

## 2023-12-16 MED ORDER — HEPARIN SODIUM (PORCINE) 1000 UNIT/ML IJ SOLN
INTRAMUSCULAR | Status: DC | PRN
  Administered 2023-12-16: 3000 [IU] via INTRAVENOUS

## 2023-12-16 MED ORDER — IOHEXOL 350 MG/ML SOLN
INTRAVENOUS | Status: DC | PRN
  Administered 2023-12-16: 30 mL

## 2023-12-16 MED ORDER — SODIUM CHLORIDE 0.9 % IV SOLN: INTRAVENOUS | Status: AC

## 2023-12-16 MED ORDER — FENTANYL CITRATE (PF) 100 MCG/2ML IJ SOLN
INTRAMUSCULAR | Status: DC | PRN
  Administered 2023-12-16: 25 ug via INTRAVENOUS

## 2023-12-16 MED ORDER — HYDRALAZINE HCL 20 MG/ML IJ SOLN: 10.0000 mg | INTRAMUSCULAR | Status: DC | PRN

## 2023-12-16 MED ORDER — BRIMONIDINE TARTRATE 0.2 % OP SOLN
1.0000 [drp] | Freq: Two times a day (BID) | OPHTHALMIC | Status: DC
  Administered 2023-12-16 – 2023-12-17 (×2): 1 [drp] via OPHTHALMIC
  Filled 2023-12-16: qty 5

## 2023-12-16 MED ORDER — MIDAZOLAM HCL 2 MG/2ML IJ SOLN
INTRAMUSCULAR | Status: AC
  Filled 2023-12-16: qty 2

## 2023-12-16 MED ORDER — SODIUM CHLORIDE 0.9% FLUSH: 3.0000 mL | INTRAVENOUS | Status: DC | PRN

## 2023-12-16 MED ORDER — VERAPAMIL HCL 2.5 MG/ML IV SOLN
INTRAVENOUS | Status: DC | PRN
  Administered 2023-12-16: 10 mL via INTRA_ARTERIAL

## 2023-12-16 MED ORDER — IPRATROPIUM-ALBUTEROL 0.5-2.5 (3) MG/3ML IN SOLN: 3.0000 mL | RESPIRATORY_TRACT | Status: DC | PRN

## 2023-12-16 MED ORDER — SODIUM CHLORIDE 0.9 % IV SOLN: 250.0000 mL | INTRAVENOUS | Status: DC | PRN

## 2023-12-16 MED ORDER — TRAZODONE HCL 50 MG PO TABS: 50.0000 mg | ORAL_TABLET | Freq: Every evening | ORAL | Status: DC | PRN

## 2023-12-16 MED ORDER — FENTANYL CITRATE (PF) 100 MCG/2ML IJ SOLN
INTRAMUSCULAR | Status: AC
  Filled 2023-12-16: qty 2

## 2023-12-16 SURGICAL SUPPLY — 7 items
CATH 5FR JL3.5 JR4 ANG PIG MP (CATHETERS) IMPLANT
DEVICE RAD TR BAND REGULAR (VASCULAR PRODUCTS) IMPLANT
GLIDESHEATH SLEND SS 6F .021 (SHEATH) IMPLANT
GUIDEWIRE INQWIRE 1.5J.035X260 (WIRE) IMPLANT
PACK CARDIAC CATHETERIZATION (CUSTOM PROCEDURE TRAY) ×1 IMPLANT
SET ATX-X65L (MISCELLANEOUS) IMPLANT
SHEATH PROBE COVER 6X72 (BAG) IMPLANT

## 2023-12-16 NOTE — Hospital Course (Addendum)
 Brief Narrative:   58 year old with history of breast cancer on chemotherapy, HTN admitted to the hospital with concerns of NSTEMI.  Upon admission started on heparin  drip.  Troponins peaked at 616, EKG did not show any acute ST-T changes.  CTA was negative for PE but showed concerns of innumerable hepatic metastases, left-sided rib lesion and pleural effusion.  Patient underwent LHC which showed normal coronaries. Stable for discharge  Assessment & Plan:  Principal Problem:   NSTEMI (non-ST elevated myocardial infarction) [facility]) Active Problems:   Hypokalemia   NSTEMI -  Troponin 616, no acute EKG changes.  LDL 77.  A1c 09/2023 5.9.  Initially started on heparin  drip and eventually LHC performed on 5/23 which was negative.  Cardiology cleared to discharge patient on amlodipine  and atorvastatin .   Tachycardia - CTA chest negative for PE   Hypokalemia -As needed repletion   Essential hypertension -continue home amlodipine    Metastatic breast cancer Innumerable liver lesions Left-sided rib lesion with small pleural effusion -under the care of Dr. Arno Bibles, their service has been added to the treatment team.   DVT prophylaxis: Heparin  drip    Code Status: Full Code Family Communication:   Discharge today once cleared by cardiology    Subjective: Doing well no complaints.  Tolerated LHC well yesterday  Examination:  General exam: Appears calm and comfortable  Respiratory system: Clear to auscultation. Respiratory effort normal. Cardiovascular system: S1 & S2 heard, RRR. No JVD, murmurs, rubs, gallops or clicks. No pedal edema. Gastrointestinal system: Abdomen is nondistended, soft and nontender. No organomegaly or masses felt. Normal bowel sounds heard. Central nervous system: Alert and oriented. No focal neurological deficits. Extremities: Symmetric 5 x 5 power. Skin: No rashes, lesions or ulcers Psychiatry: Judgement and insight appear normal. Mood & affect appropriate.

## 2023-12-16 NOTE — Progress Notes (Signed)
 LOCATION: right RADIAL  DEFLATED PER PROTOCOL: YES  TIME BAND OFF/DRESSING APPLIED: 1640, see note below  SITE UPON ARRIVAL: LEVEL 0  SITE AFTER BAND REMOVAL: LEVEL 0  CIRCULATION SENSATION AND MOVEMENT: +2, bilateral radial pulses, + movement noted  COMMENTS: TR Band deflated to 0cc, small amount of reddish drainage noted at approximately 1640, TR Band removed and manual pressure applied for approximately 5  minutes, gauze with tegaderm applied and then 4x4 gauze with co-band applied for pressure drsg, [facility] at bedside, informed to remove pressure drsg upon arrival to [facility], [facility] RN informed when given updated report  Care instructions for right wrist given to pt, may shower, but no soaking arm in water  such as hot tubs, bath tubs, or pools x1 week, no heavy lifting, pushing, or pulling, inform staff if bleeding is noted or if at home call 911

## 2023-12-16 NOTE — Progress Notes (Signed)
   12/16/23 0951  TOC Brief Assessment  Insurance and Status Reviewed  Patient has primary care physician Yes  Home environment has been reviewed Residees in single family home with spouse  Prior level of function: Independent with ADLs at baseline  Prior/Current Home Services No current home services  Social Drivers of Health Review SDOH reviewed no interventions necessary  Readmission risk has been reviewed Yes  Transition of care needs no transition of care needs at this time

## 2023-12-16 NOTE — Progress Notes (Addendum)
 Patient Name: Paige Foster Date of Encounter: 12/16/2023 [facility] Cardiologist: Wendie Hamburg, MD   Interval Summary  .    She feels significantly better today.  Has absolutely no complaints.  Potassium also back to normal ranges.  Plan for cardiac catheterization today.  Vital Signs .    Vitals:   12/15/23 2104 12/15/23 2142 12/16/23 0051 12/16/23 0418  BP: 112/81 112/81 103/77 112/80  Pulse: 89 89 87 78  Resp: 20  20 20   Temp: 98.4 F (36.9 C)  98 F (36.7 C) 98.1 F (36.7 C)  TempSrc: Oral  Oral Oral  SpO2: 97%  100% 99%  Weight:      Height:        Intake/Output Summary (Last 24 hours) at 12/16/2023 1057 Last data filed at 12/16/2023 0557 Gross per 24 hour  Intake 635.74 ml  Output --  Net 635.74 ml      12/15/2023    6:29 PM 12/15/2023    6:39 AM 12/02/2023    9:06 AM  Last 3 Weights  Weight (lbs) 119 lb 4.3 oz 120 lb 125 lb  Weight (kg) 54.1 kg 54.432 kg 56.7 kg      Telemetry/ECG    Sinus rhythm heart rates in the 70s to 80s- Personally Reviewed  CV Studies    Echocardiogram 12/16/2023  1. Left ventricular ejection fraction, by estimation, is 65 to 70%. The  left ventricle has normal function. The left ventricle has no regional  wall motion abnormalities. The average left ventricular global  longitudinal strain is -18.6 %. The global  longitudinal strain is normal.   2. Right ventricular systolic function is normal. The right ventricular  size is normal. There is mildly elevated pulmonary artery systolic  pressure.   3. Left atrial size was mildly dilated.   4. The mitral valve is normal in structure. Trivial mitral valve  regurgitation. No evidence of mitral stenosis.   5. The aortic valve is tricuspid. Aortic valve regurgitation is not  visualized. Aortic valve sclerosis is present, with no evidence of aortic  valve stenosis.   6. The inferior vena cava is normal in size with greater than 50%  respiratory variability,  suggesting right atrial pressure of 3 mmHg.   Physical Exam .   GEN: No acute distress.   Neck: mild JVD Cardiac: RRR, no murmurs, rubs, or gallops.  Respiratory: Clear to auscultation bilaterally. GI: Soft, nontender, non-distended  MS: No edema  Patient Profile    [name] is a 58 y.o. 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  Cardiology asked to see due to elevated troponins  Assessment & Plan .     NSTEMI Initially presented with dizziness and lightheadedness.  EKG with no acute ST-T wave changes.  Troponins went from [phone], no chest pain or obvious anginal equivalents.  She has atypical presentation that does not clearly represent ACS although troponin elevation also without clear cause.  Does have elevated WBC neutrophil count possibly suggestive of secondary etiology.  Echocardiogram with preserved biventricular function.  No significant valvular disease.  No wall motion abnormalities noted.  No evidence of PE, has small left pleural effusion.  Innumerable hepatic metastases noted.  Will plan for cardiac catheterization with no clear explanation of troponin elevation  For now continue with IV heparin , aspirin, Lopressor 25 mg twice daily, atorvastatin  80 mg. LDL 77.  If evidence of infarct  need new target LDL goal <55.   Chronic HFpEF Had formal earlier this month May 2025 showing preserved biventricular function with moderate TR. Limited echocardiogram this admission with preserved biventricular function.  Asymptomatic, volume status looks okay.  Small pleural effusion noted as above.  Will evaluate after cardiac catheterization need for diuresis.  Hypokalemia Potassium as low was 2.6 on admission.  Magnesium 1.7.  Repleted aggressively and has now normalized.    Tachycardia Resolved with treatment of her electrolytes.  Hypertension Looks well-controlled also on amlodipine  10  mg.  Breast cancer Leukocytosis Anemia Per primary team  Informed Consent   Shared Decision Making/Informed Consent The risks [stroke (1 in 1000), death (1 in 1000), kidney failure [usually temporary] (1 in 500), bleeding (1 in 200), allergic reaction [possibly serious] (1 in 200)], benefits (diagnostic support and management of coronary artery disease) and alternatives of a cardiac catheterization were discussed in detail with Ms. Kirk and she is willing to proceed.       For questions or updates, please contact [facility] Please consult [email or web address] for contact info under        Signed, Burnetta Cart, PA-C    Patient seen and examined.  Agree with above documentation.  On exam, patient is alert and oriented, regular rate and rhythm, no murmurs, lungs CTAB, no LE edema or JVD.  Plan for LHC today.  Patient denies any chest pain or dyspnea currently.  Echocardiogram with normal LV systolic function.  Wendie Hamburg, MD

## 2023-12-16 NOTE — Progress Notes (Signed)
 PHARMACY - ANTICOAGULATION CONSULT NOTE  Pharmacy Consult for Heparin  Indication: chest pain/ACS  Allergies  Allergen Reactions   Bee Pollen Itching    Watery eyes and nose running    Patient Measurements: Height: 5\' 4"  (162.6 cm) Weight: 54.1 kg (119 lb 4.3 oz) IBW/kg (Calculated) : 54.7 HEPARIN  DW (KG): 54.4  Vital Signs: Temp: 98.1 F (36.7 C) (05/23 0418) Temp Source: Oral (05/23 0418) BP: 112/80 (05/23 0418) Pulse Rate: 78 (05/23 0418)  Labs: Recent Labs    12/15/23 0653 12/15/23 0828 12/15/23 1149 12/15/23 2130 12/16/23 0441 12/16/23 0726  HGB 9.7*  --   --   --  10.3*  --   HCT 28.5*  --   --   --  30.8*  --   PLT 153  --   --   --  167  --   HEPARINUNFRC  --   --   --  <0.10*  --  0.20*  CREATININE 0.59  --   --   --  0.60  --   TROPONINIHS 85* 401* 616*  --   --   --     Estimated Creatinine Clearance: 64.7 mL/min (by C-G formula based on SCr of 0.6 mg/dL).   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   CT neg for PE. Start IV heparin .  No anticoag. PTA.  Baseline Hgb only 9.7 (on chemo), Plts 153 - Hgb 10.3, Plts WNL, HL 0.2 remains low.  Goal of Therapy:  Heparin  level 0.3-0.7 units/ml Monitor platelets  by anticoagulation protocol: Yes   Plan:  Increase IV heparin  to 950 units/hr Daily HL and CBC 1200 on cath schedule   Kalli Greenfield Darcel Early, PharmD, BCPS Clinical Staff Pharmacist Enis Harsh Stillinger 12/16/2023,8:50 AM

## 2023-12-16 NOTE — Progress Notes (Signed)
  Echocardiogram 2D Echocardiogram has been performed.  Teegan Brandis L Jesper Stirewalt RDCS 12/16/2023, 8:16 AM

## 2023-12-16 NOTE — Progress Notes (Signed)
 PROGRESS NOTE    Paige Foster  UJW:[id number] DOB: 1966/06/16 DOA: 12/15/2023 PCP: Kandace Organ, NP    Brief Narrative:   58 year old with history of breast cancer on chemotherapy, HTN admitted to the hospital with concerns of NSTEMI.  Upon admission started on heparin  drip.  Troponins peaked at 616, EKG did not show any acute ST-T changes.  CTA was negative for PE but showed concerns of innumerable hepatic metastases, left-sided rib lesion and pleural effusion.  Assessment & Plan:  Principal Problem:   NSTEMI (non-ST elevated myocardial infarction) (HCC) Active Problems:   Hypokalemia   NSTEMI -Patient started on heparin  drip upon admission.  Troponin 616, no acute EKG changes.  LDL 77.  A1c 09/2023 5.9.   -Received aspirin in the ER.  Continue aspirin and statin.  Recent echocardiogram reviewed, limited echo has been ordered. - Currently on Lopressor twice daily, will further adjust as necessary. - Cardiology considering LHC therefore no need to trend troponin otherwise we can trend until it peaks.   Tachycardia - CTA chest negative for PE   Hypokalemia -As needed repletion   Essential hypertension -continue home amlodipine    Metastatic breast cancer Innumerable liver lesions Left-sided rib lesion with small pleural effusion -under the care of Dr. Arno Bibles, their service has been added to the treatment team.   DVT prophylaxis: Heparin  drip    Code Status: Full Code Family Communication:   Plans for left heart catheterization    Subjective: Seen at bedside, denies any chest pain.  Tells me she wants to go home but have explained to her why she needs left heart catheterization.  Her hospital course will bedetermined based on that   Examination:  General exam: Appears calm and comfortable  Respiratory system: Clear to auscultation. Respiratory effort normal. Cardiovascular system: S1 & S2 heard, RRR. No JVD, murmurs, rubs, gallops or clicks. No pedal  edema. Gastrointestinal system: Abdomen is nondistended, soft and nontender. No organomegaly or masses felt. Normal bowel sounds heard. Central nervous system: Alert and oriented. No focal neurological deficits. Extremities: Symmetric 5 x 5 power. Skin: No rashes, lesions or ulcers Psychiatry: Judgement and insight appear normal. Mood & affect appropriate.                Diet Orders (From admission, onward)     Start     Ordered   12/16/23 0001  Diet NPO time specified Except for: Sips with Meds  Diet effective midnight       Comments: NPO for solid foods after midnight, may have clear liquids until 5am, then NPO (this would be for inpatients and outpatients)  Question:  Except for  Answer:  Sips with Meds   12/15/23 1828            Objective: Vitals:   12/15/23 2104 12/15/23 2142 12/16/23 0051 12/16/23 0418  BP: 112/81 112/81 103/77 112/80  Pulse: 89 89 87 78  Resp: 20  20 20   Temp: 98.4 F (36.9 C)  98 F (36.7 C) 98.1 F (36.7 C)  TempSrc: Oral  Oral Oral  SpO2: 97%  100% 99%  Weight:      Height:        Intake/Output Summary (Last 24 hours) at 12/16/2023 1103 Last data filed at 12/16/2023 0557 Gross per 24 hour  Intake 635.74 ml  Output --  Net 635.74 ml   Filed Weights   12/15/23 0639 12/15/23 1829  Weight: 54.4 kg 54.1 kg    Scheduled Meds:  amLODipine   10 mg Oral Daily   atorvastatin   80 mg Oral Daily   brimonidine   1 drop Both Eyes TID   dorzolamide -timolol   1 drop Both Eyes BID   feeding supplement  237 mL Oral BID BM   metoprolol tartrate  25 mg Oral BID   traZODone  50 mg Oral QHS   Continuous Infusions:  sodium chloride  1 mL/kg/hr (12/16/23 0557)   heparin  950 Units/hr (12/16/23 0850)    Nutritional status     Body mass index is 20.47 kg/m.  Data Reviewed:   CBC: Recent Labs  Lab 12/15/23 0653 12/16/23 0441  WBC 18.0* 14.0*  NEUTROABS 15.0*  --   HGB 9.7* 10.3*  HCT 28.5* 30.8*  MCV 91.6 94.8  PLT 153 167   Basic  Metabolic Panel: Recent Labs  Lab 12/15/23 0653 12/15/23 0828 12/16/23 0441  NA 130*  --  138  K 2.6*  --  3.7  CL 99  --  109  CO2 23  --  21*  GLUCOSE 172*  --  101*  BUN 7  --  8  CREATININE 0.59  --  0.60  CALCIUM  8.5*  --  8.8*  MG  --  1.7  --    GFR: Estimated Creatinine Clearance: 64.7 mL/min (by C-G formula based on SCr of 0.6 mg/dL). Liver Function Tests: Recent Labs  Lab 12/15/23 0828  AST 33  ALT 26  ALKPHOS 169*  BILITOT 0.3  PROT 6.2*  ALBUMIN 3.2*   Recent Labs  Lab 12/15/23 0653  LIPASE 40   No results for input(s): "AMMONIA" in the last 168 hours. Coagulation Profile: No results for input(s): "INR", "PROTIME" in the last 168 hours. Cardiac Enzymes: No results for input(s): "CKTOTAL", "CKMB", "CKMBINDEX", "TROPONINI" in the last 168 hours. BNP (last 3 results) No results for input(s): "PROBNP" in the last 8760 hours. HbA1C: No results for input(s): "HGBA1C" in the last 72 hours. CBG: No results for input(s): "GLUCAP" in the last 168 hours. Lipid Profile: Recent Labs    12/15/23 1149  CHOL 121  HDL 31*  LDLCALC 77  TRIG 64  CHOLHDL 3.9   Thyroid  Function Tests: No results for input(s): "TSH", "T4TOTAL", "FREET4", "T3FREE", "THYROIDAB" in the last 72 hours. Anemia Panel: No results for input(s): "VITAMINB12", "FOLATE", "FERRITIN", "TIBC", "IRON", "RETICCTPCT" in the last 72 hours. Sepsis Labs: No results for input(s): "PROCALCITON", "LATICACIDVEN" in the last 168 hours.  No results found for this or any previous visit (from the past 240 hours).       Radiology Studies: ECHOCARDIOGRAM LIMITED Result Date: 12/16/2023    ECHOCARDIOGRAM LIMITED REPORT   Patient Name:   Paige Foster Date of Exam: 12/16/2023 Medical Rec #:  [id number]       Height:       64.0 in Accession #:    [id number]      Weight:       119.3 lb Date of Birth:  Mar 22, 1966       BSA:          1.570 m Patient Age:    58 years        BP:           112/80 mmHg Patient  Gender: F               HR:           78 bpm. Exam Location:  Inpatient Procedure: Limited Echo, Limited Color Doppler, Cardiac Doppler, Strain Analysis  and 3D Echo (Both Spectral and Color Flow Doppler were utilized            during procedure). Indications:    CHF  History:        Patient has prior history of Echocardiogram examinations, most                 recent 11/29/2023. Risk Factors:Hypertension and Dyslipidemia.                 NSTEMI.  Sonographer:    Juanita Shaw Referring Phys: [id number] SHENG L HALEY IMPRESSIONS  1. Left ventricular ejection fraction, by estimation, is 65 to 70%. The left ventricle has normal function. The left ventricle has no regional wall motion abnormalities. The average left ventricular global longitudinal strain is -18.6 %. The global longitudinal strain is normal.  2. Right ventricular systolic function is normal. The right ventricular size is normal. There is mildly elevated pulmonary artery systolic pressure.  3. Left atrial size was mildly dilated.  4. The mitral valve is normal in structure. Trivial mitral valve regurgitation. No evidence of mitral stenosis.  5. The aortic valve is tricuspid. Aortic valve regurgitation is not visualized. Aortic valve sclerosis is present, with no evidence of aortic valve stenosis.  6. The inferior vena cava is normal in size with greater than 50% respiratory variability, suggesting right atrial pressure of 3 mmHg. FINDINGS  Left Ventricle: Left ventricular ejection fraction, by estimation, is 65 to 70%. The left ventricle has normal function. The left ventricle has no regional wall motion abnormalities. The average left ventricular global longitudinal strain is -18.6 %. Strain was performed and the global longitudinal strain is normal. The left ventricular internal cavity size was normal in size. There is no left ventricular hypertrophy. Right Ventricle: The right ventricular size is normal. Right ventricular systolic function is  normal. There is mildly elevated pulmonary artery systolic pressure. The tricuspid regurgitant velocity is 3.06 m/s, and with an assumed right atrial pressure of 3 mmHg, the estimated right ventricular systolic pressure is 40.5 mmHg. Left Atrium: Left atrial size was mildly dilated. Right Atrium: Right atrial size was normal in size. Pericardium: There is no evidence of pericardial effusion. Mitral Valve: The mitral valve is normal in structure. Trivial mitral valve regurgitation. No evidence of mitral valve stenosis. Tricuspid Valve: The tricuspid valve is normal in structure. Tricuspid valve regurgitation is mild . No evidence of tricuspid stenosis. Aortic Valve: The aortic valve is tricuspid. Aortic valve regurgitation is not visualized. Aortic valve sclerosis is present, with no evidence of aortic valve stenosis. Pulmonic Valve: The pulmonic valve was normal in structure. Pulmonic valve regurgitation is not visualized. No evidence of pulmonic stenosis. Aorta: The aortic root is normal in size and structure. Venous: The inferior vena cava is normal in size with greater than 50% respiratory variability, suggesting right atrial pressure of 3 mmHg. IAS/Shunts: No atrial level shunt detected by color flow Doppler. Additional Comments: 3D was performed not requiring image post processing on an independent workstation and was normal.  LEFT VENTRICLE PLAX 2D LVIDd:         4.80 cm LVIDs:         2.80 cm      2D Longitudinal Strain LV PW:         0.80 cm      2D Strain GLS Avg:     -18.6 % LV IVS:        0.60 cm  LV Volumes (MOD) LV vol d,  MOD A2C: 89.2 ml LV vol d, MOD A4C: 120.0 ml LV vol s, MOD A2C: 30.9 ml LV vol s, MOD A4C: 39.2 ml LV SV MOD A2C:     58.3 ml LV SV MOD A4C:     120.0 ml LV SV MOD BP:      69.2 ml RIGHT VENTRICLE             IVC RV S prime:     14.60 cm/s  IVC diam: 1.20 cm TAPSE (M-mode): 3.4 cm TRICUSPID VALVE TR Peak grad:   37.5 mmHg TR Vmax:        306.00 cm/s Alexandria Angel MD Electronically  signed by Alexandria Angel MD Signature Date/Time: 12/16/2023/10:03:45 AM    Final    CT Angio Chest Pulmonary Embolism (PE) W or WO Contrast Result Date: 12/15/2023 CLINICAL DATA:  Acute onset dizziness, weakness, and nausea. History of left-sided breast cancer status post left mastectomy with recurrent disease. * Tracking Code: BO * EXAM: CT ANGIOGRAPHY CHEST WITH CONTRAST TECHNIQUE: Multidetector CT imaging of the chest was performed using the standard protocol during bolus administration of intravenous contrast. Multiplanar CT image reconstructions and MIPs were obtained to evaluate the vascular anatomy. RADIATION DOSE REDUCTION: This exam was performed according to the departmental dose-optimization program which includes automated exposure control, adjustment of the mA and/or kV according to patient size and/or use of iterative reconstruction technique. CONTRAST:  75mL OMNIPAQUE  IOHEXOL  350 MG/ML SOLN COMPARISON:  Same day chest radiograph, CT chest dated 10/26/2023 FINDINGS: Cardiovascular: The study is high quality for the evaluation of pulmonary embolism. There are no filling defects in the central, lobar, segmental or subsegmental pulmonary artery branches to suggest acute pulmonary embolism. Great vessels are normal in course and caliber. Normal heart size. No significant pericardial fluid/thickening. Aortic atherosclerosis. Mediastinum/Nodes: Imaged thyroid  gland without nodules meeting criteria for imaging follow-up by size. Normal esophagus. No pathologically enlarged axillary, supraclavicular, mediastinal, or hilar lymph nodes. Lungs/Pleura: The central airways are patent. No focal consolidation. No pneumothorax. Small left pleural effusion. Upper abdomen: Innumerable hepatic metastases, better evaluated on prior CT abdomen and pelvis. Musculoskeletal: Postsurgical changes of the left breast. Peripherally enhancing clinic mass is again seen, measuring 6.5 x 3.8 cm, previously 5.9 x 3.4 cm  (remeasured), located within the left inferior anterolateral seventh rib. Multiple additional lytic rib lesions are increased in conspicuity involving the right lateral seventh and posterolateral ninth as well as left anterolateral fourth ribs. Increased conspicuity of multifocal right scapular lesions. Asymmetric sclerosis of the medial right clavicular head. Multifocal thoracic spinal metastases are better evaluated on prior MRI. Review of the MIP images confirms the above findings. IMPRESSION: 1. No evidence of pulmonary embolism. 2. Small left pleural effusion. 3. Increased size of left anterolateral rib lesion and conspicuity of additional multifocal osseous metastases. 4. Innumerable hepatic metastases, better evaluated on prior CT abdomen and pelvis. Aortic Atherosclerosis (ICD10-I70.0). Electronically Signed   By: Limin  Xu M.D.   On: 12/15/2023 15:48   DG Chest 2 View Result Date: 12/15/2023 CLINICAL DATA:  Shortness of breath. EXAM: CHEST - 2 VIEW COMPARISON:  10/17/2023 FINDINGS: Right chest wall port a catheter is noted with tip in the superior cavoatrial junction. Heart size and mediastinal contours are unremarkable. Mildly increased interstitial opacities identified throughout both lungs. No pleural fluid. Left lateral destructive rib lesion is again noted. Surgical clips identified in the left axilla. IMPRESSION: 1. Mildly increased interstitial opacities throughout both lungs. Differential considerations include mild interstitial edema versus atypical infection.  2. Left lateral destructive rib lesion. Electronically Signed   By: Kimberley Penman M.D.   On: 12/15/2023 07:33           LOS: 0 days   Time spent= 35 mins    Maggie Schooner, MD [facility]  If 7PM-7AM, please contact night-coverage  12/16/2023, 11:03 AM

## 2023-12-16 NOTE — H&P (View-Only) (Signed)
 Patient Name: Paige Foster Date of Encounter: 12/16/2023 [facility] Cardiologist: Wendie Hamburg, MD   Interval Summary  .    She feels significantly better today.  Has absolutely no complaints.  Potassium also back to normal ranges.  Plan for cardiac catheterization today.  Vital Signs .    Vitals:   12/15/23 2104 12/15/23 2142 12/16/23 0051 12/16/23 0418  BP: 112/81 112/81 103/77 112/80  Pulse: 89 89 87 78  Resp: 20  20 20   Temp: 98.4 F (36.9 C)  98 F (36.7 C) 98.1 F (36.7 C)  TempSrc: Oral  Oral Oral  SpO2: 97%  100% 99%  Weight:      Height:        Intake/Output Summary (Last 24 hours) at 12/16/2023 1057 Last data filed at 12/16/2023 0557 Gross per 24 hour  Intake 635.74 ml  Output --  Net 635.74 ml      12/15/2023    6:29 PM 12/15/2023    6:39 AM 12/02/2023    9:06 AM  Last 3 Weights  Weight (lbs) 119 lb 4.3 oz 120 lb 125 lb  Weight (kg) 54.1 kg 54.432 kg 56.7 kg      Telemetry/ECG    Sinus rhythm heart rates in the 70s to 80s- Personally Reviewed  CV Studies    Echocardiogram 12/16/2023  1. Left ventricular ejection fraction, by estimation, is 65 to 70%. The  left ventricle has normal function. The left ventricle has no regional  wall motion abnormalities. The average left ventricular global  longitudinal strain is -18.6 %. The global  longitudinal strain is normal.   2. Right ventricular systolic function is normal. The right ventricular  size is normal. There is mildly elevated pulmonary artery systolic  pressure.   3. Left atrial size was mildly dilated.   4. The mitral valve is normal in structure. Trivial mitral valve  regurgitation. No evidence of mitral stenosis.   5. The aortic valve is tricuspid. Aortic valve regurgitation is not  visualized. Aortic valve sclerosis is present, with no evidence of aortic  valve stenosis.   6. The inferior vena cava is normal in size with greater than 50%  respiratory variability,  suggesting right atrial pressure of 3 mmHg.   Physical Exam .   GEN: No acute distress.   Neck: mild JVD Cardiac: RRR, no murmurs, rubs, or gallops.  Respiratory: Clear to auscultation bilaterally. GI: Soft, nontender, non-distended  MS: No edema  Patient Profile    Paige Foster is a 58 y.o. 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  Cardiology asked to see due to elevated troponins  Assessment & Plan .     NSTEMI Initially presented with dizziness and lightheadedness.  EKG with no acute ST-T wave changes.  Troponins went from [phone], no chest pain or obvious anginal equivalents.  She has atypical presentation that does not clearly represent ACS although troponin elevation also without clear cause.  Does have elevated WBC neutrophil count possibly suggestive of secondary etiology.  Echocardiogram with preserved biventricular function.  No significant valvular disease.  No wall motion abnormalities noted.  No evidence of PE, has small left pleural effusion.  Innumerable hepatic metastases noted.  Will plan for cardiac catheterization with no clear explanation of troponin elevation  For now continue with IV heparin , aspirin, Lopressor 25 mg twice daily, atorvastatin  80 mg. LDL 77.  If evidence of infarct  need new target LDL goal <55.   Chronic HFpEF Had formal earlier this month May 2025 showing preserved biventricular function with moderate TR. Limited echocardiogram this admission with preserved biventricular function.  Asymptomatic, volume status looks okay.  Small pleural effusion noted as above.  Will evaluate after cardiac catheterization need for diuresis.  Hypokalemia Potassium as low was 2.6 on admission.  Magnesium 1.7.  Repleted aggressively and has now normalized.    Tachycardia Resolved with treatment of her electrolytes.  Hypertension Looks well-controlled also on amlodipine  10  mg.  Breast cancer Leukocytosis Anemia Per primary team  Informed Consent   Shared Decision Making/Informed Consent The risks [stroke (1 in 1000), death (1 in 1000), kidney failure [usually temporary] (1 in 500), bleeding (1 in 200), allergic reaction [possibly serious] (1 in 200)], benefits (diagnostic support and management of coronary artery disease) and alternatives of a cardiac catheterization were discussed in detail with Ms. Kirk and she is willing to proceed.       For questions or updates, please contact [facility] Please consult [email or web address] for contact info under        Signed, Burnetta Cart, PA-C    Patient seen and examined.  Agree with above documentation.  On exam, patient is alert and oriented, regular rate and rhythm, no murmurs, lungs CTAB, no LE edema or JVD.  Plan for LHC today.  Patient denies any chest pain or dyspnea currently.  Echocardiogram with normal LV systolic function.  Wendie Hamburg, MD

## 2023-12-16 NOTE — Plan of Care (Signed)

## 2023-12-16 NOTE — Progress Notes (Signed)
 Pt brought to cath lab holding, [place] 7, connected to monitor, denies pain and shortness of breath, report received from [facility], safety maintained, IV heparin  infusing at 950 units/hour, IV NS infusing at 54.4cc/hour, see MAR

## 2023-12-16 NOTE — Interval H&P Note (Signed)
 History and Physical Interval Note:  12/16/2023 2:12 PM  Paige Foster  has presented today for surgery, with the diagnosis of nstemi.  The various methods of treatment have been discussed with the patient and family. After consideration of risks, benefits and other options for treatment, the patient has consented to  Procedure(s): LEFT HEART CATH AND CORONARY ANGIOGRAPHY (N/A) as a surgical intervention.  The patient's history has been reviewed, patient examined, no change in status, stable for surgery.  I have reviewed the patient's chart and labs.  Questions were answered to the patient's satisfaction.   Cath Lab Visit (complete for each Cath Lab visit)  Clinical Evaluation Leading to the Procedure:   ACS: Yes.    Non-ACS:    Anginal Classification: CCS I  Anti-ischemic medical therapy: Minimal Therapy (1 class of medications)  Non-Invasive Test Results: No non-invasive testing performed  Prior CABG: No previous CABG        Paige Foster [facility] 12/16/2023 2:12 PM

## 2023-12-17 DIAGNOSIS — E876 Hypokalemia: Secondary | ICD-10-CM | POA: Diagnosis not present

## 2023-12-17 DIAGNOSIS — I2489 Other forms of acute ischemic heart disease: Secondary | ICD-10-CM

## 2023-12-17 DIAGNOSIS — I214 Non-ST elevation (NSTEMI) myocardial infarction: Secondary | ICD-10-CM | POA: Diagnosis not present

## 2023-12-17 LAB — CBC
HCT: 29.1 % — ABNORMAL LOW (ref 36.0–46.0)
Hemoglobin: 9.7 g/dL — ABNORMAL LOW (ref 12.0–15.0)
MCH: 31.5 pg (ref 26.0–34.0)
MCHC: 33.3 g/dL (ref 30.0–36.0)
MCV: 94.5 fL (ref 80.0–100.0)
Platelets: 187 10*3/uL (ref 150–400)
RBC: 3.08 MIL/uL — ABNORMAL LOW (ref 3.87–5.11)
RDW: 15.5 % (ref 11.5–15.5)
WBC: 11.8 10*3/uL — ABNORMAL HIGH (ref 4.0–10.5)
nRBC: 0 % (ref 0.0–0.2)

## 2023-12-17 LAB — BASIC METABOLIC PANEL WITH GFR
Anion gap: 5 (ref 5–15)
BUN: 9 mg/dL (ref 6–20)
CO2: 26 mmol/L (ref 22–32)
Calcium: 8.4 mg/dL — ABNORMAL LOW (ref 8.9–10.3)
Chloride: 105 mmol/L (ref 98–111)
Creatinine, Ser: 0.64 mg/dL (ref 0.44–1.00)
GFR, Estimated: >60 mL/min (ref >60)
Glucose, Bld: 96 mg/dL (ref 70–99)
Potassium: 3.5 mmol/L (ref 3.5–5.1)
Sodium: 136 mmol/L (ref 135–145)

## 2023-12-17 LAB — MAGNESIUM: Magnesium: 1.8 mg/dL (ref 1.7–2.4)

## 2023-12-17 MED ORDER — ATORVASTATIN CALCIUM 20 MG PO TABS
20.0000 mg | ORAL_TABLET | Freq: Every day | ORAL | Status: DC
  Administered 2023-12-17: 20 mg via ORAL
  Filled 2023-12-17: qty 1

## 2023-12-17 NOTE — Discharge Summary (Signed)
 Physician Discharge Summary  Paige Foster:[id number] DOB: 1966-03-02 DOA: 12/15/2023  PCP: Kandace Organ, NP  Admit date: 12/15/2023 Discharge date: 12/17/2023  Admitted From: [facility] Disposition:  Home  Recommendations for Outpatient Follow-up:  Follow up with PCP in 1-2 weeks Please obtain BMP/CBC in one week your next doctors visit.    Discharge Condition: Stable   Brief/Interim Summary: Brief Narrative:   58 year old with history of breast cancer on chemotherapy, HTN admitted to the hospital with concerns of NSTEMI.  Upon admission started on heparin  drip.  Troponins peaked at 616, EKG did not show any acute ST-T changes.  CTA was negative for PE but showed concerns of innumerable hepatic metastases, left-sided rib lesion and pleural effusion.  Patient underwent LHC which showed normal coronaries. Stable for discharge  Assessment & Plan:  Principal Problem:   NSTEMI (non-ST elevated myocardial infarction) [facility]) Active Problems:   Hypokalemia   NSTEMI -  Troponin 616, no acute EKG changes.  LDL 77.  A1c 09/2023 5.9.  Initially started on heparin  drip and eventually LHC performed on 5/23 which was negative.  Cardiology cleared to discharge patient on amlodipine  and atorvastatin .   Tachycardia - CTA chest negative for PE   Hypokalemia -As needed repletion   Essential hypertension -continue home amlodipine    Metastatic breast cancer Innumerable liver lesions Left-sided rib lesion with small pleural effusion -under the care of Dr. Arno Bibles, their service has been added to the treatment team.   DVT prophylaxis: Heparin  drip    Code Status: Full Code Family Communication:   Discharge today once cleared by cardiology    Subjective: Doing well no complaints.  Tolerated LHC well yesterday  Examination:  General exam: Appears calm and comfortable  Respiratory system: Clear to auscultation. Respiratory effort normal. Cardiovascular system: S1 & S2 heard, RRR.  No JVD, murmurs, rubs, gallops or clicks. No pedal edema. Gastrointestinal system: Abdomen is nondistended, soft and nontender. No organomegaly or masses felt. Normal bowel sounds heard. Central nervous system: Alert and oriented. No focal neurological deficits. Extremities: Symmetric 5 x 5 power. Skin: No rashes, lesions or ulcers Psychiatry: Judgement and insight appear normal. Mood & affect appropriate.    Discharge Diagnoses:  Principal Problem:   NSTEMI (non-ST elevated myocardial infarction) [facility]) Active Problems:   Hypokalemia   Chronic diastolic heart failure (HCC)   Demand ischemia (HCC)      Discharge Exam: Vitals:   12/16/23 2148 12/17/23 0311  BP: 100/65 121/70  Pulse: 83 85  Resp:  20  Temp:  99.6 F (37.6 C)  SpO2:  100%   Vitals:   12/16/23 1737 12/16/23 2050 12/16/23 2148 12/17/23 0311  BP: 93/61 (!) 87/53 100/65 121/70  Pulse: 73 78 83 85  Resp: 18 20  20   Temp: 98.6 F (37 C) 98.6 F (37 C)  99.6 F (37.6 C)  TempSrc: Oral Oral  Oral  SpO2: 99% 99%  100%  Weight:      Height:          Discharge Instructions   Allergies as of 12/17/2023       Reactions   Bee Pollen Itching   Watery eyes and nose running        Medication List     TAKE these medications    amLODipine  10 MG tablet Commonly known as: NORVASC  Take 1 tablet by mouth daily. What changed:  how much to take how to take this when to take this additional instructions   anastrozole  1 MG  tablet Commonly known as: ARIMIDEX  TAKE 1 TABLET(1 MG) BY MOUTH DAILY   atorvastatin  20 MG tablet Commonly known as: LIPITOR Take 1 tablet (20 mg total) by mouth daily.   brimonidine  0.2 % ophthalmic solution Commonly known as: ALPHAGAN  Place 1 drop into both eyes 3 (three) times daily.   dexamethasone  4 MG tablet Commonly known as: DECADRON  Take 2 tabs by mouth 2 times daily starting day before chemo. Then take 2 tabs daily for 2 days starting day after chemo. Take with  food.   DOCEtaxel  20 MG/ML Conc Commonly known as: TAXOTERE  Inject 121 mg into the vein once.   dorzolamide -timolol  2-0.5 % ophthalmic solution Commonly known as: COSOPT  1 drop 2 (two) times daily.   lidocaine -prilocaine  cream Commonly known as: EMLA  Apply to affected area 1 hour before access prn   losartan  50 MG tablet Commonly known as: COZAAR  TAKE 1 TABLET(50 MG) BY MOUTH DAILY   ondansetron  8 MG tablet Commonly known as: ZOFRAN  Take 1 tablet (8 mg total) by mouth every 8 (eight) hours as needed for nausea or vomiting.   pegfilgrastim  6 MG/0.6ML injection Commonly known as: NEULASTA  ONPRO KIT Inject 6 mg into the skin once. Inject via provided programmed delivery device.   pertuzumab  420 MG/14ML Soln Commonly known as: PERJETA  Inject 420 mg into the vein once.   prochlorperazine  10 MG tablet Commonly known as: COMPAZINE  Take 1 tablet (10 mg total) by mouth every 6 (six) hours as needed for nausea or vomiting.   trastuzumab -anns 336 mg in sodium chloride  0.9 % 250 mL Inject 336 mg into the vein once.   zoledronic  acid 4 MG/5ML injection Commonly known as: ZOMETA  Inject 4 mg into the vein once.        Follow-up Information     Nche, Connye Delaine, NP Follow up.   Specialty: Internal Medicine Contact information: [address] [place] [address] [phone]                Allergies  Allergen Reactions   Bee Pollen Itching    Watery eyes and nose running    You were cared for by a hospitalist during your hospital stay. If you have any questions about your discharge medications or the care you received while you were in the hospital after you are discharged, you can call the unit and asked to speak with the hospitalist on call if the hospitalist that took care of you is not available. Once you are discharged, your primary care physician will handle any further medical issues. Please note that no refills for any discharge medications will  be authorized once you are discharged, as it is imperative that you return to your primary care physician (or establish a relationship with a primary care physician if you do not have one) for your aftercare needs so that they can reassess your need for medications and monitor your lab values.  You were cared for by a hospitalist during your hospital stay. If you have any questions about your discharge medications or the care you received while you were in the hospital after you are discharged, you can call the unit and asked to speak with the hospitalist on call if the hospitalist that took care of you is not available. Once you are discharged, your primary care physician will handle any further medical issues. Please note that NO REFILLS for any discharge medications will be authorized once you are discharged, as it is imperative that you return to your primary care  physician (or establish a relationship with a primary care physician if you do not have one) for your aftercare needs so that they can reassess your need for medications and monitor your lab values.  Please request your Prim.MD to go over all Hospital Tests and Procedure/Radiological results at the follow up, please get all Hospital records sent to your Prim MD by signing hospital release before you go home.  Get CBC, CMP, 2 view Chest X ray checked  by Primary MD during your next visit or SNF MD in 5-7 days ( we routinely change or add medications that can affect your baseline labs and fluid status, therefore we recommend that you get the mentioned basic workup next visit with your PCP, your PCP may decide not to get them or add new tests based on their clinical decision)  On your next visit with your primary care physician please Get Medicines reviewed and adjusted.  If you experience worsening of your admission symptoms, develop shortness of breath, life threatening emergency, suicidal or homicidal thoughts you must seek medical attention  immediately by calling 911 or calling your MD immediately  if symptoms less severe.  You Must read complete instructions/literature along with all the possible adverse reactions/side effects for all the Medicines you take and that have been prescribed to you. Take any new Medicines after you have completely understood and accpet all the possible adverse reactions/side effects.   Do not drive, operate heavy machinery, perform activities at heights, swimming or participation in water  activities or provide baby sitting services if your were admitted for syncope or siezures until you have seen by Primary MD or a Neurologist and advised to do so again.  Do not drive when taking Pain medications.   Procedures/Studies: CARDIAC CATHETERIZATION Result Date: 12/16/2023 Normal coronary anatomy Low LVEDP 6 mm Hg Plan: no cardiac cause for elevated troponin. Suspect related to dehydration and medical illness  ECHOCARDIOGRAM LIMITED Result Date: 12/16/2023    ECHOCARDIOGRAM LIMITED REPORT   Patient Name:   Paige Foster Date of Exam: 12/16/2023 Medical Rec #:  [id number]       Height:       64.0 in Accession #:    [id number]      Weight:       119.3 lb Date of Birth:  02/14/1966       BSA:          1.570 m Patient Age:    58 years        BP:           112/80 mmHg Patient Gender: F               HR:           78 bpm. Exam Location:  Inpatient Procedure: Limited Echo, Limited Color Doppler, Cardiac Doppler, Strain Analysis            and 3D Echo (Both Spectral and Color Flow Doppler were utilized            during procedure). Indications:    CHF  History:        Patient has prior history of Echocardiogram examinations, most                 recent 11/29/2023. Risk Factors:Hypertension and Dyslipidemia.                 NSTEMI.  Sonographer:    Juanita Shaw Referring Phys: [id number] SHENG L HALEY IMPRESSIONS  1. Left ventricular  ejection fraction, by estimation, is 65 to 70%. The left ventricle has normal function. The left  ventricle has no regional wall motion abnormalities. The average left ventricular global longitudinal strain is -18.6 %. The global longitudinal strain is normal.  2. Right ventricular systolic function is normal. The right ventricular size is normal. There is mildly elevated pulmonary artery systolic pressure.  3. Left atrial size was mildly dilated.  4. The mitral valve is normal in structure. Trivial mitral valve regurgitation. No evidence of mitral stenosis.  5. The aortic valve is tricuspid. Aortic valve regurgitation is not visualized. Aortic valve sclerosis is present, with no evidence of aortic valve stenosis.  6. The inferior vena cava is normal in size with greater than 50% respiratory variability, suggesting right atrial pressure of 3 mmHg. FINDINGS  Left Ventricle: Left ventricular ejection fraction, by estimation, is 65 to 70%. The left ventricle has normal function. The left ventricle has no regional wall motion abnormalities. The average left ventricular global longitudinal strain is -18.6 %. Strain was performed and the global longitudinal strain is normal. The left ventricular internal cavity size was normal in size. There is no left ventricular hypertrophy. Right Ventricle: The right ventricular size is normal. Right ventricular systolic function is normal. There is mildly elevated pulmonary artery systolic pressure. The tricuspid regurgitant velocity is 3.06 m/s, and with an assumed right atrial pressure of 3 mmHg, the estimated right ventricular systolic pressure is 40.5 mmHg. Left Atrium: Left atrial size was mildly dilated. Right Atrium: Right atrial size was normal in size. Pericardium: There is no evidence of pericardial effusion. Mitral Valve: The mitral valve is normal in structure. Trivial mitral valve regurgitation. No evidence of mitral valve stenosis. Tricuspid Valve: The tricuspid valve is normal in structure. Tricuspid valve regurgitation is mild . No evidence of tricuspid stenosis.  Aortic Valve: The aortic valve is tricuspid. Aortic valve regurgitation is not visualized. Aortic valve sclerosis is present, with no evidence of aortic valve stenosis. Pulmonic Valve: The pulmonic valve was normal in structure. Pulmonic valve regurgitation is not visualized. No evidence of pulmonic stenosis. Aorta: The aortic root is normal in size and structure. Venous: The inferior vena cava is normal in size with greater than 50% respiratory variability, suggesting right atrial pressure of 3 mmHg. IAS/Shunts: No atrial level shunt detected by color flow Doppler. Additional Comments: 3D was performed not requiring image post processing on an independent workstation and was normal.  LEFT VENTRICLE PLAX 2D LVIDd:         4.80 cm LVIDs:         2.80 cm      2D Longitudinal Strain LV PW:         0.80 cm      2D Strain GLS Avg:     -18.6 % LV IVS:        0.60 cm  LV Volumes (MOD) LV vol d, MOD A2C: 89.2 ml LV vol d, MOD A4C: 120.0 ml LV vol s, MOD A2C: 30.9 ml LV vol s, MOD A4C: 39.2 ml LV SV MOD A2C:     58.3 ml LV SV MOD A4C:     120.0 ml LV SV MOD BP:      69.2 ml RIGHT VENTRICLE             IVC RV S prime:     14.60 cm/s  IVC diam: 1.20 cm TAPSE (M-mode): 3.4 cm TRICUSPID VALVE TR Peak grad:   37.5 mmHg TR Vmax:  306.00 cm/s Alexandria Angel MD Electronically signed by Alexandria Angel MD Signature Date/Time: 12/16/2023/10:03:45 AM    Final    CT Angio Chest Pulmonary Embolism (PE) W or WO Contrast Result Date: 12/15/2023 CLINICAL DATA:  Acute onset dizziness, weakness, and nausea. History of left-sided breast cancer status post left mastectomy with recurrent disease. * Tracking Code: BO * EXAM: CT ANGIOGRAPHY CHEST WITH CONTRAST TECHNIQUE: Multidetector CT imaging of the chest was performed using the standard protocol during bolus administration of intravenous contrast. Multiplanar CT image reconstructions and MIPs were obtained to evaluate the vascular anatomy. RADIATION DOSE REDUCTION: This exam was  performed according to the departmental dose-optimization program which includes automated exposure control, adjustment of the mA and/or kV according to patient size and/or use of iterative reconstruction technique. CONTRAST:  75mL OMNIPAQUE  IOHEXOL  350 MG/ML SOLN COMPARISON:  Same day chest radiograph, CT chest dated 10/26/2023 FINDINGS: Cardiovascular: The study is high quality for the evaluation of pulmonary embolism. There are no filling defects in the central, lobar, segmental or subsegmental pulmonary artery branches to suggest acute pulmonary embolism. Great vessels are normal in course and caliber. Normal heart size. No significant pericardial fluid/thickening. Aortic atherosclerosis. Mediastinum/Nodes: Imaged thyroid  gland without nodules meeting criteria for imaging follow-up by size. Normal esophagus. No pathologically enlarged axillary, supraclavicular, mediastinal, or hilar lymph nodes. Lungs/Pleura: The central airways are patent. No focal consolidation. No pneumothorax. Small left pleural effusion. Upper abdomen: Innumerable hepatic metastases, better evaluated on prior CT abdomen and pelvis. Musculoskeletal: Postsurgical changes of the left breast. Peripherally enhancing clinic mass is again seen, measuring 6.5 x 3.8 cm, previously 5.9 x 3.4 cm (remeasured), located within the left inferior anterolateral seventh rib. Multiple additional lytic rib lesions are increased in conspicuity involving the right lateral seventh and posterolateral ninth as well as left anterolateral fourth ribs. Increased conspicuity of multifocal right scapular lesions. Asymmetric sclerosis of the medial right clavicular head. Multifocal thoracic spinal metastases are better evaluated on prior MRI. Review of the MIP images confirms the above findings. IMPRESSION: 1. No evidence of pulmonary embolism. 2. Small left pleural effusion. 3. Increased size of left anterolateral rib lesion and conspicuity of additional multifocal  osseous metastases. 4. Innumerable hepatic metastases, better evaluated on prior CT abdomen and pelvis. Aortic Atherosclerosis (ICD10-I70.0). Electronically Signed   By: Limin  Xu M.D.   On: 12/15/2023 15:48   DG Chest 2 View Result Date: 12/15/2023 CLINICAL DATA:  Shortness of breath. EXAM: CHEST - 2 VIEW COMPARISON:  10/17/2023 FINDINGS: Right chest wall port a catheter is noted with tip in the superior cavoatrial junction. Heart size and mediastinal contours are unremarkable. Mildly increased interstitial opacities identified throughout both lungs. No pleural fluid. Left lateral destructive rib lesion is again noted. Surgical clips identified in the left axilla. IMPRESSION: 1. Mildly increased interstitial opacities throughout both lungs. Differential considerations include mild interstitial edema versus atypical infection. 2. Left lateral destructive rib lesion. Electronically Signed   By: Kimberley Penman M.D.   On: 12/15/2023 07:33   ECHOCARDIOGRAM COMPLETE Result Date: 11/29/2023    ECHOCARDIOGRAM REPORT   Patient Name:   BRANAE CRAIL Date of Exam: 11/29/2023 Medical Rec #:  [id number]       Height:       64.0 in Accession #:    [id number]      Weight:       127.0 lb Date of Birth:  Dec 10, 1965       BSA:          1.613  m Patient Age:    58 years        BP:           122/70 mmHg Patient Gender: F               HR:           66 bpm. Exam Location:  Outpatient Procedure: 2D Echo, 3D Echo, Cardiac Doppler, Color Doppler and Strain Analysis            (Both Spectral and Color Flow Doppler were utilized during            procedure). Indications:    Z51.11 Encounter for antineoplastic chemotheraphy  History:        Patient has prior history of Echocardiogram examinations, most                 recent 08/02/2019. Risk Factors:Hypertension, Dyslipidemia and                 Non-Smoker. Patient denies chest pain, SOB and leg edema.                 History of left breast cancer with metastasis to liver.  Sonographer:     Richarda Chance RVT, RDCS (AE), RDMS Referring Phys: [id number] PRAVEENA IRUKU IMPRESSIONS  1. Left ventricular ejection fraction, by estimation, is 60 to 65%. Left ventricular ejection fraction by 3D volume is 61 %. The left ventricle has normal function. The left ventricle has no regional wall motion abnormalities. There is mild left ventricular hypertrophy of the basal-septal segment. Left ventricular diastolic parameters were normal. The average left ventricular global longitudinal strain is -18.9 %. The global longitudinal strain is normal.  2. Right ventricular systolic function is normal. The right ventricular size is normal. There is normal pulmonary artery systolic pressure. The estimated right ventricular systolic pressure is 31.5 mmHg.  3. Left atrial size was mildly dilated.  4. The mitral valve is normal in structure. Trivial mitral valve regurgitation. No evidence of mitral stenosis.  5. Tricuspid valve regurgitation is moderate.  6. The aortic valve is normal in structure. Aortic valve regurgitation is not visualized. No aortic stenosis is present.  7. The inferior vena cava is normal in size with greater than 50% respiratory variability, suggesting right atrial pressure of 3 mmHg. Comparison(s): EF 60%, GLS -19.7%. FINDINGS  Left Ventricle: Left ventricular ejection fraction, by estimation, is 60 to 65%. Left ventricular ejection fraction by 3D volume is 61 %. The left ventricle has normal function. The left ventricle has no regional wall motion abnormalities. The average left ventricular global longitudinal strain is -18.9 %. Strain was performed and the global longitudinal strain is normal. The left ventricular internal cavity size was normal in size. There is mild left ventricular hypertrophy of the basal-septal segment. Left ventricular diastolic parameters were normal. Normal left ventricular filling pressure. Right Ventricle: The right ventricular size is normal. No increase in right ventricular  wall thickness. Right ventricular systolic function is normal. There is normal pulmonary artery systolic pressure. The tricuspid regurgitant velocity is 2.67 m/s, and  with an assumed right atrial pressure of 3 mmHg, the estimated right ventricular systolic pressure is 31.5 mmHg. Left Atrium: Left atrial size was mildly dilated. Right Atrium: Right atrial size was normal in size. Pericardium: There is no evidence of pericardial effusion. Mitral Valve: The mitral valve is normal in structure. Trivial mitral valve regurgitation. No evidence of mitral valve stenosis. Tricuspid Valve: The tricuspid valve is normal in  structure. Tricuspid valve regurgitation is moderate . No evidence of tricuspid stenosis. Aortic Valve: The aortic valve is normal in structure. Aortic valve regurgitation is not visualized. No aortic stenosis is present. Aortic valve mean gradient measures 4.0 mmHg. Aortic valve peak gradient measures 10.9 mmHg. Aortic valve area, by VTI measures 2.00 cm. Pulmonic Valve: The pulmonic valve was normal in structure. Pulmonic valve regurgitation is trivial. No evidence of pulmonic stenosis. Aorta: The aortic root is normal in size and structure. Venous: The inferior vena cava is normal in size with greater than 50% respiratory variability, suggesting right atrial pressure of 3 mmHg. IAS/Shunts: No atrial level shunt detected by color flow Doppler. Additional Comments: 3D was performed not requiring image post processing on an independent workstation and was normal.  LEFT VENTRICLE PLAX 2D LVIDd:         4.64 cm         Diastology LVIDs:         2.89 cm         LV e' medial:    10.40 cm/s LV PW:         0.69 cm         LV E/e' medial:  8.0 LV IVS:        1.13 cm         LV e' lateral:   19.40 cm/s LVOT diam:     2.00 cm         LV E/e' lateral: 4.3 LV SV:         61 LV SV Index:   38              2D Longitudinal LVOT Area:     3.14 cm        Strain                                2D Strain GLS   -22.2 %                                 (A4C):                                2D Strain GLS   -16.1 %                                (A3C):                                2D Strain GLS   -18.5 %                                (A2C):                                2D Strain GLS   -18.9 %                                Avg:  3D Volume EF                                LV 3D EF:    Left                                             ventricul                                             ar                                             ejection                                             fraction                                             by 3D                                             volume is                                             61 %.                                 3D Volume EF:                                3D EF:        61 %                                LV EDV:       165 ml                                LV ESV:       63 ml                                LV SV:        101 ml RIGHT VENTRICLE RV S prime:     13.10 cm/s TAPSE (M-mode): 3.0 cm LEFT ATRIUM             Index        RIGHT ATRIUM  Index LA diam:        4.17 cm 2.59 cm/m   RA Area:     13.20 cm LA Vol (A2C):   59.8 ml 37.07 ml/m  RA Volume:   27.70 ml  17.17 ml/m LA Vol (A4C):   52.1 ml 32.30 ml/m LA Biplane Vol: 57.3 ml 35.52 ml/m  AORTIC VALVE                    PULMONIC VALVE AV Area (Vmax):    2.04 cm     PV Vmax:       0.95 m/s AV Area (Vmean):   2.11 cm     PV Peak grad:  3.6 mmHg AV Area (VTI):     2.00 cm AV Vmax:           165.00 cm/s AV Vmean:          92.900 cm/s AV VTI:            0.304 m AV Peak Grad:      10.9 mmHg AV Mean Grad:      4.0 mmHg LVOT Vmax:         107.00 cm/s LVOT Vmean:        62.300 cm/s LVOT VTI:          0.194 m LVOT/AV VTI ratio: 0.64  AORTA Ao Root diam: 2.80 cm Ao Asc diam:  2.99 cm Ao Arch diam: 2.7 cm MITRAL VALVE               TRICUSPID VALVE MV Area (PHT): 3.93 cm    TR Peak  grad:   28.5 mmHg MV Decel Time: 193 msec    TR Vmax:        267.00 cm/s MV E velocity: 83.60 cm/s MV A velocity: 80.40 cm/s  SHUNTS MV E/A ratio:  1.04        Systemic VTI:  0.19 m                            Systemic Diam: 2.00 cm Gaylyn Keas MD Electronically signed by Gaylyn Keas MD Signature Date/Time: 11/29/2023/10:53:21 AM    Final    IR IMAGING GUIDED PORT INSERTION Result Date: 11/25/2023 INDICATION: Recurrent left-sided metastatic breast cancer. EXAM: IMPLANTED PORT A CATH PLACEMENT WITH ULTRASOUND AND FLUOROSCOPIC GUIDANCE MEDICATIONS: None. ANESTHESIA/SEDATION: Versed  1 mg IV; Fentanyl  50 mcg IV; administered by the radiology nurse Moderate Sedation Time:  20 minutes The patient's vital signs and level of consciousness were continuously monitored during the procedure by the interventional radiology nurse under my direct supervision. FLUOROSCOPY: Radiation exposure index: 5 mGy, air kerma COMPLICATIONS: None immediate. PROCEDURE: The right neck and chest was prepped with chlorhexidine , and draped in the usual sterile fashion using maximum barrier technique (cap and mask, sterile gown, sterile gloves, large sterile sheet, hand hygiene and cutaneous antiseptic). Local anesthesia was attained by infiltration with 1% lidocaine  with epinephrine . Ultrasound demonstrated patency of the right internal jugular vein, and this was documented with an image. Under real-time ultrasound guidance, this vein was accessed with a 21 gauge micropuncture needle and image documentation was performed. A small dermatotomy was made at the access site with an 11 scalpel. A 0.018" wire was advanced into the SVC and the access needle exchanged for a 71F micropuncture vascular sheath. The 0.018" wire was then removed and a 0.035" wire advanced into the IVC. An appropriate location for the subcutaneous reservoir  was selected below the clavicle and an incision was made through the skin and underlying soft tissues. The subcutaneous  tissues were then dissected using a combination of blunt and sharp surgical technique and a pocket was formed. A Bard Clear Vue single lumen power injectable portacatheter was then tunneled through the subcutaneous tissues from the pocket to the dermatotomy and the port reservoir placed within the subcutaneous pocket. The venous access site was then serially dilated and a peel away vascular sheath placed over the wire. The wire was removed and the port catheter advanced into position under fluoroscopic guidance. The catheter tip is positioned in the superior cavoatrial junction. This was documented with a spot image. The portacatheter was then tested and found to flush and aspirate well. The port was flushed with saline followed by 100 units/mL heparinized saline. The pocket was then closed in two layers using first subdermal inverted interrupted absorbable sutures followed by a running subcuticular suture. The epidermis was then sealed with Dermabond. The dermatotomy at the venous access site was also closed with Dermabond. IMPRESSION: Successful placement of a right IJ approach Bard Clear Vue port catheter with ultrasound and fluoroscopic guidance. The catheter is ready for use. Electronically Signed   By: Fernando Hoyer M.D.   On: 11/25/2023 11:43     The results of significant diagnostics from this hospitalization (including imaging, microbiology, ancillary and laboratory) are listed below for reference.     Microbiology: No results found for this or any previous visit (from the past 240 hours).   Labs: BNP (last 3 results) No results for input(s): "BNP" in the last 8760 hours. Basic Metabolic Panel: Recent Labs  Lab 12/15/23 0653 12/15/23 0828 12/16/23 0441 12/17/23 0837  NA 130*  --  138 136  K 2.6*  --  3.7 3.5  CL 99  --  109 105  CO2 23  --  21* 26  GLUCOSE 172*  --  101* 96  BUN 7  --  8 9  CREATININE 0.59  --  0.60 0.64  CALCIUM  8.5*  --  8.8* 8.4*  MG  --  1.7  --  1.8    Liver Function Tests: Recent Labs  Lab 12/15/23 0828  AST 33  ALT 26  ALKPHOS 169*  BILITOT 0.3  PROT 6.2*  ALBUMIN 3.2*   Recent Labs  Lab 12/15/23 0653  LIPASE 40   No results for input(s): "AMMONIA" in the last 168 hours. CBC: Recent Labs  Lab 12/15/23 0653 12/16/23 0441 12/17/23 0837  WBC 18.0* 14.0* 11.8*  NEUTROABS 15.0*  --   --   HGB 9.7* 10.3* 9.7*  HCT 28.5* 30.8* 29.1*  MCV 91.6 94.8 94.5  PLT 153 167 187   Cardiac Enzymes: No results for input(s): "CKTOTAL", "CKMB", "CKMBINDEX", "TROPONINI" in the last 168 hours. BNP: Invalid input(s): "POCBNP" CBG: No results for input(s): "GLUCAP" in the last 168 hours. D-Dimer No results for input(s): "DDIMER" in the last 72 hours. Hgb A1c No results for input(s): "HGBA1C" in the last 72 hours. Lipid Profile Recent Labs    12/15/23 1149  CHOL 121  HDL 31*  LDLCALC 77  TRIG 64  CHOLHDL 3.9   Thyroid  function studies No results for input(s): "TSH", "T4TOTAL", "T3FREE", "THYROIDAB" in the last 72 hours.  Invalid input(s): "FREET3" Anemia work up No results for input(s): "VITAMINB12", "FOLATE", "FERRITIN", "TIBC", "IRON", "RETICCTPCT" in the last 72 hours. Urinalysis    Component Value Date/Time   COLORURINE STRAW (A) 12/15/2023 0981  APPEARANCEUR CLEAR 12/15/2023 0809   LABSPEC 1.010 12/15/2023 0809   PHURINE 5.0 12/15/2023 0809   GLUCOSEU NEGATIVE 12/15/2023 0809   HGBUR NEGATIVE 12/15/2023 0809   HGBUR large 04/09/2008 1548   BILIRUBINUR NEGATIVE 12/15/2023 0809   KETONESUR NEGATIVE 12/15/2023 0809   PROTEINUR NEGATIVE 12/15/2023 0809   UROBILINOGEN 0.2 04/09/2008 1548   NITRITE NEGATIVE 12/15/2023 0809   LEUKOCYTESUR NEGATIVE 12/15/2023 0809   Sepsis Labs Recent Labs  Lab 12/15/23 0653 12/16/23 0441 12/17/23 0837  WBC 18.0* 14.0* 11.8*   Microbiology No results found for this or any previous visit (from the past 240 hours).   Time coordinating discharge:  I have spent 35 minutes  face to face with the patient and on the ward discussing the patients care, assessment, plan and disposition with other care givers. >50% of the time was devoted counseling the patient about the risks and benefits of treatment/Discharge disposition and coordinating care.   SIGNED:   Maggie Schooner, MD  [facility] 12/17/2023, 1:53 PM   If 7PM-7AM, please contact night-coverage

## 2023-12-17 NOTE — Progress Notes (Signed)
 PROGRESS NOTE    Paige Foster  ZOX:[id number] DOB: [birth year] DOA: 12/15/2023 PCP: Kandace Organ, NP    Brief Narrative:   58 year old with history of breast cancer on chemotherapy, HTN admitted to the hospital with concerns of NSTEMI.  Upon admission started on heparin  drip.  Troponins peaked at 616, EKG did not show any acute ST-T changes.  CTA was negative for PE but showed concerns of innumerable hepatic metastases, left-sided rib lesion and pleural effusion.  Patient underwent LHC which showed normal coronaries. Stable for discharge  Assessment & Plan:  Principal Problem:   NSTEMI (non-ST elevated myocardial infarction) [facility]) Active Problems:   Hypokalemia   NSTEMI -  Troponin 616, no acute EKG changes.  LDL 77.  A1c 09/2023 5.9.  Initially started on heparin  drip and eventually LHC performed on 5/23 which was negative.  Cardiology cleared to discharge patient on amlodipine  and atorvastatin .   Tachycardia - CTA chest negative for PE   Hypokalemia -As needed repletion   Essential hypertension -continue home amlodipine    Metastatic breast cancer Innumerable liver lesions Left-sided rib lesion with small pleural effusion -under the care of Dr. Arno Bibles, their service has been added to the treatment team.   DVT prophylaxis: Heparin  drip    Code Status: Full Code Family Communication:   Discharge today once cleared by cardiology    Subjective: Doing well no complaints.  Tolerated LHC well yesterday  Examination:  General exam: Appears calm and comfortable  Respiratory system: Clear to auscultation. Respiratory effort normal. Cardiovascular system: S1 & S2 heard, RRR. No JVD, murmurs, rubs, gallops or clicks. No pedal edema. Gastrointestinal system: Abdomen is nondistended, soft and nontender. No organomegaly or masses felt. Normal bowel sounds heard. Central nervous system: Alert and oriented. No focal neurological deficits. Extremities: Symmetric 5 x 5  power. Skin: No rashes, lesions or ulcers Psychiatry: Judgement and insight appear normal. Mood & affect appropriate.                Diet Orders (From admission, onward)     Start     Ordered   12/16/23 1512  Diet Heart Room service appropriate? Yes; Fluid consistency: Thin  Diet effective now       Question Answer Comment  Room service appropriate? Yes   Fluid consistency: Thin      12/16/23 1511            Objective: Vitals:   12/16/23 1737 12/16/23 2050 12/16/23 2148 12/17/23 0311  BP: 93/61 (!) 87/53 100/65 121/70  Pulse: 73 78 83 85  Resp: 18 20  20   Temp: 98.6 F (37 C) 98.6 F (37 C)  99.6 F (37.6 C)  TempSrc: Oral Oral  Oral  SpO2: 99% 99%  100%  Weight:      Height:        Intake/Output Summary (Last 24 hours) at 12/17/2023 1214 Last data filed at 12/16/2023 1524 Gross per 24 hour  Intake 664.04 ml  Output --  Net 664.04 ml   Filed Weights   12/15/23 0639 12/15/23 1829  Weight: 54.4 kg 54.1 kg    Scheduled Meds:  amLODipine   10 mg Oral Daily   atorvastatin   20 mg Oral Daily   brimonidine   1 drop Both Eyes BID   dorzolamide -timolol   1 drop Both Eyes BID   enoxaparin (LOVENOX) injection  40 mg Subcutaneous Q24H   feeding supplement  237 mL Oral BID BM   sodium chloride  flush  3 mL Intravenous Q12H  traZODone  50 mg Oral QHS   Continuous Infusions:  sodium chloride       Nutritional status     Body mass index is 20.47 kg/m.  Data Reviewed:   CBC: Recent Labs  Lab 12/15/23 0653 12/16/23 0441 12/17/23 0837  WBC 18.0* 14.0* 11.8*  NEUTROABS 15.0*  --   --   HGB 9.7* 10.3* 9.7*  HCT 28.5* 30.8* 29.1*  MCV 91.6 94.8 94.5  PLT 153 167 187   Basic Metabolic Panel: Recent Labs  Lab 12/15/23 0653 12/15/23 0828 12/16/23 0441 12/17/23 0837  NA 130*  --  138 136  K 2.6*  --  3.7 3.5  CL 99  --  109 105  CO2 23  --  21* 26  GLUCOSE 172*  --  101* 96  BUN 7  --  8 9  CREATININE 0.59  --  0.60 0.64  CALCIUM  8.5*  --   8.8* 8.4*  MG  --  1.7  --  1.8   GFR: Estimated Creatinine Clearance: 64.7 mL/min (by C-G formula based on SCr of 0.64 mg/dL). Liver Function Tests: Recent Labs  Lab 12/15/23 0828  AST 33  ALT 26  ALKPHOS 169*  BILITOT 0.3  PROT 6.2*  ALBUMIN 3.2*   Recent Labs  Lab 12/15/23 0653  LIPASE 40   No results for input(s): "AMMONIA" in the last 168 hours. Coagulation Profile: No results for input(s): "INR", "PROTIME" in the last 168 hours. Cardiac Enzymes: No results for input(s): "CKTOTAL", "CKMB", "CKMBINDEX", "TROPONINI" in the last 168 hours. BNP (last 3 results) No results for input(s): "PROBNP" in the last 8760 hours. HbA1C: No results for input(s): "HGBA1C" in the last 72 hours. CBG: No results for input(s): "GLUCAP" in the last 168 hours. Lipid Profile: Recent Labs    12/15/23 1149  CHOL 121  HDL 31*  LDLCALC 77  TRIG 64  CHOLHDL 3.9   Thyroid  Function Tests: No results for input(s): "TSH", "T4TOTAL", "FREET4", "T3FREE", "THYROIDAB" in the last 72 hours. Anemia Panel: No results for input(s): "VITAMINB12", "FOLATE", "FERRITIN", "TIBC", "IRON", "RETICCTPCT" in the last 72 hours. Sepsis Labs: No results for input(s): "PROCALCITON", "LATICACIDVEN" in the last 168 hours.  No results found for this or any previous visit (from the past 240 hours).       Radiology Studies: CARDIAC CATHETERIZATION Result Date: 12/16/2023 Normal coronary anatomy Low LVEDP 6 mm Hg Plan: no cardiac cause for elevated troponin. Suspect related to dehydration and medical illness  ECHOCARDIOGRAM LIMITED Result Date: 12/16/2023    ECHOCARDIOGRAM LIMITED REPORT   Patient Name:   Paige Foster Date of Exam: 12/16/2023 Medical Rec #:  [id number]       Height:       64.0 in Accession #:    [id number]      Weight:       119.3 lb Date of Birth:  02/02/1966       BSA:          1.570 m Patient Age:    58 years        BP:           112/80 mmHg Patient Gender: F               HR:           78  bpm. Exam Location:  Inpatient Procedure: Limited Echo, Limited Color Doppler, Cardiac Doppler, Strain Analysis            and 3D Echo (  Both Spectral and Color Flow Doppler were utilized            during procedure). Indications:    CHF  History:        Patient has prior history of Echocardiogram examinations, most                 recent 11/29/2023. Risk Factors:Hypertension and Dyslipidemia.                 NSTEMI.  Sonographer:    Juanita Shaw Referring Phys: [id number] SHENG L HALEY IMPRESSIONS  1. Left ventricular ejection fraction, by estimation, is 65 to 70%. The left ventricle has normal function. The left ventricle has no regional wall motion abnormalities. The average left ventricular global longitudinal strain is -18.6 %. The global longitudinal strain is normal.  2. Right ventricular systolic function is normal. The right ventricular size is normal. There is mildly elevated pulmonary artery systolic pressure.  3. Left atrial size was mildly dilated.  4. The mitral valve is normal in structure. Trivial mitral valve regurgitation. No evidence of mitral stenosis.  5. The aortic valve is tricuspid. Aortic valve regurgitation is not visualized. Aortic valve sclerosis is present, with no evidence of aortic valve stenosis.  6. The inferior vena cava is normal in size with greater than 50% respiratory variability, suggesting right atrial pressure of 3 mmHg. FINDINGS  Left Ventricle: Left ventricular ejection fraction, by estimation, is 65 to 70%. The left ventricle has normal function. The left ventricle has no regional wall motion abnormalities. The average left ventricular global longitudinal strain is -18.6 %. Strain was performed and the global longitudinal strain is normal. The left ventricular internal cavity size was normal in size. There is no left ventricular hypertrophy. Right Ventricle: The right ventricular size is normal. Right ventricular systolic function is normal. There is mildly elevated pulmonary  artery systolic pressure. The tricuspid regurgitant velocity is 3.06 m/s, and with an assumed right atrial pressure of 3 mmHg, the estimated right ventricular systolic pressure is 40.5 mmHg. Left Atrium: Left atrial size was mildly dilated. Right Atrium: Right atrial size was normal in size. Pericardium: There is no evidence of pericardial effusion. Mitral Valve: The mitral valve is normal in structure. Trivial mitral valve regurgitation. No evidence of mitral valve stenosis. Tricuspid Valve: The tricuspid valve is normal in structure. Tricuspid valve regurgitation is mild . No evidence of tricuspid stenosis. Aortic Valve: The aortic valve is tricuspid. Aortic valve regurgitation is not visualized. Aortic valve sclerosis is present, with no evidence of aortic valve stenosis. Pulmonic Valve: The pulmonic valve was normal in structure. Pulmonic valve regurgitation is not visualized. No evidence of pulmonic stenosis. Aorta: The aortic root is normal in size and structure. Venous: The inferior vena cava is normal in size with greater than 50% respiratory variability, suggesting right atrial pressure of 3 mmHg. IAS/Shunts: No atrial level shunt detected by color flow Doppler. Additional Comments: 3D was performed not requiring image post processing on an independent workstation and was normal.  LEFT VENTRICLE PLAX 2D LVIDd:         4.80 cm LVIDs:         2.80 cm      2D Longitudinal Strain LV PW:         0.80 cm      2D Strain GLS Avg:     -18.6 % LV IVS:        0.60 cm  LV Volumes (MOD) LV vol d, MOD A2C: 89.2  ml LV vol d, MOD A4C: 120.0 ml LV vol s, MOD A2C: 30.9 ml LV vol s, MOD A4C: 39.2 ml LV SV MOD A2C:     58.3 ml LV SV MOD A4C:     120.0 ml LV SV MOD BP:      69.2 ml RIGHT VENTRICLE             IVC RV S prime:     14.60 cm/s  IVC diam: 1.20 cm TAPSE (M-mode): 3.4 cm TRICUSPID VALVE TR Peak grad:   37.5 mmHg TR Vmax:        306.00 cm/s Alexandria Angel MD Electronically signed by Alexandria Angel MD Signature  Date/Time: 12/16/2023/10:03:45 AM    Final    CT Angio Chest Pulmonary Embolism (PE) W or WO Contrast Result Date: 12/15/2023 CLINICAL DATA:  Acute onset dizziness, weakness, and nausea. History of left-sided breast cancer status post left mastectomy with recurrent disease. * Tracking Code: BO * EXAM: CT ANGIOGRAPHY CHEST WITH CONTRAST TECHNIQUE: Multidetector CT imaging of the chest was performed using the standard protocol during bolus administration of intravenous contrast. Multiplanar CT image reconstructions and MIPs were obtained to evaluate the vascular anatomy. RADIATION DOSE REDUCTION: This exam was performed according to the departmental dose-optimization program which includes automated exposure control, adjustment of the mA and/or kV according to patient size and/or use of iterative reconstruction technique. CONTRAST:  75mL OMNIPAQUE  IOHEXOL  350 MG/ML SOLN COMPARISON:  Same day chest radiograph, CT chest dated 10/26/2023 FINDINGS: Cardiovascular: The study is high quality for the evaluation of pulmonary embolism. There are no filling defects in the central, lobar, segmental or subsegmental pulmonary artery branches to suggest acute pulmonary embolism. Great vessels are normal in course and caliber. Normal heart size. No significant pericardial fluid/thickening. Aortic atherosclerosis. Mediastinum/Nodes: Imaged thyroid  gland without nodules meeting criteria for imaging follow-up by size. Normal esophagus. No pathologically enlarged axillary, supraclavicular, mediastinal, or hilar lymph nodes. Lungs/Pleura: The central airways are patent. No focal consolidation. No pneumothorax. Small left pleural effusion. Upper abdomen: Innumerable hepatic metastases, better evaluated on prior CT abdomen and pelvis. Musculoskeletal: Postsurgical changes of the left breast. Peripherally enhancing clinic mass is again seen, measuring 6.5 x 3.8 cm, previously 5.9 x 3.4 cm (remeasured), located within the left inferior  anterolateral seventh rib. Multiple additional lytic rib lesions are increased in conspicuity involving the right lateral seventh and posterolateral ninth as well as left anterolateral fourth ribs. Increased conspicuity of multifocal right scapular lesions. Asymmetric sclerosis of the medial right clavicular head. Multifocal thoracic spinal metastases are better evaluated on prior MRI. Review of the MIP images confirms the above findings. IMPRESSION: 1. No evidence of pulmonary embolism. 2. Small left pleural effusion. 3. Increased size of left anterolateral rib lesion and conspicuity of additional multifocal osseous metastases. 4. Innumerable hepatic metastases, better evaluated on prior CT abdomen and pelvis. Aortic Atherosclerosis (ICD10-I70.0). Electronically Signed   By: Limin  Xu M.D.   On: 12/15/2023 15:48           LOS: 0 days   Time spent= 35 mins    Maggie Schooner, MD [facility]  If 7PM-7AM, please contact night-coverage  12/17/2023, 12:14 PM

## 2023-12-17 NOTE — Plan of Care (Signed)

## 2023-12-17 NOTE — Plan of Care (Signed)
  Problem: Safety: Goal: Ability to remain free from injury will improve Outcome: Progressing   Problem: Skin Integrity: Goal: Risk for impaired skin integrity will decrease Outcome: Progressing   Problem: Education: Goal: Understanding of CV disease, CV risk reduction, and recovery process will improve Outcome: Progressing

## 2023-12-17 NOTE — Progress Notes (Signed)
 Patient Name: Paige Foster Date of Encounter: 12/17/2023 [facility] Cardiologist: Wendie Hamburg, MD   Interval Summary  .    BP 121/70.  Creatinine 0.6, hemoglobin 10.3.  Denies any chest pain or dyspnea  Vital Signs .    Vitals:   12/16/23 1737 12/16/23 2050 12/16/23 2148 12/17/23 0311  BP: 93/61 (!) 87/53 100/65 121/70  Pulse: 73 78 83 85  Resp: 18 20  20   Temp: 98.6 F (37 C) 98.6 F (37 C)  99.6 F (37.6 C)  TempSrc: Oral Oral  Oral  SpO2: 99% 99%  100%  Weight:      Height:        Intake/Output Summary (Last 24 hours) at 12/17/2023 0836 Last data filed at 12/16/2023 1524 Gross per 24 hour  Intake 664.04 ml  Output --  Net 664.04 ml      12/15/2023    6:29 PM 12/15/2023    6:39 AM 12/02/2023    9:06 AM  Last 3 Weights  Weight (lbs) 119 lb 4.3 oz 120 lb 125 lb  Weight (kg) 54.1 kg 54.432 kg 56.7 kg      Telemetry/ECG    Sinus rhythm  Personally Reviewed  CV Studies    Echocardiogram 12/16/2023  1. Left ventricular ejection fraction, by estimation, is 65 to 70%. The  left ventricle has normal function. The left ventricle has no regional  wall motion abnormalities. The average left ventricular global  longitudinal strain is -18.6 %. The global  longitudinal strain is normal.   2. Right ventricular systolic function is normal. The right ventricular  size is normal. There is mildly elevated pulmonary artery systolic  pressure.   3. Left atrial size was mildly dilated.   4. The mitral valve is normal in structure. Trivial mitral valve  regurgitation. No evidence of mitral stenosis.   5. The aortic valve is tricuspid. Aortic valve regurgitation is not  visualized. Aortic valve sclerosis is present, with no evidence of aortic  valve stenosis.   6. The inferior vena cava is normal in size with greater than 50%  respiratory variability, suggesting right atrial pressure of 3 mmHg.   Physical Exam .   GEN: No acute distress.   Neck: mild  JVD Cardiac: RRR, no murmurs, rubs, or gallops.  Respiratory: Clear to auscultation bilaterally. GI: Soft, nontender, non-distended  MS: No edema  Patient Profile    Paige Foster is a 58 y.o. 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  Cardiology asked to see due to elevated troponins  Assessment & Plan .     Elevated troponin Initially presented with dizziness and lightheadedness.  EKG with no acute ST-T wave changes.  Troponins went from [phone], no chest pain or obvious anginal equivalents.  She has atypical presentation that does not clearly represent ACS although troponin elevation also without clear cause.  Echocardiogram with EF 65 to 70%, normal RV function.  LHC showed normal coronary arteries, low LVEDP at 6 mmHg - Suspect demand ischemia in setting of diarrhea and hypovolemia.  Hypokalemia Potassium as low was 2.6 on admission.  Magnesium 1.7.  Repleted aggressively and has now normalized.    Tachycardia Resolved with treatment of her electrolytes.  Hypertension Looks well-controlled also on amlodipine  10 mg.  Breast cancer Leukocytosis Anemia Per primary team  [facility] will sign off.   Medication Recommendations: Amlodipine  10 mg daily, atorvastatin   20 mg daily Other recommendations (labs, testing, etc): None Follow up as an outpatient: As needed    For questions or updates, please contact [facility] Please consult [email or web address] for contact info under        Signed, Wendie Hamburg, MD    Patient seen and examined.  Agree with above documentation.  On exam, patient is alert and oriented, regular rate and rhythm, no murmurs, lungs CTAB, no LE edema or JVD.  Plan for LHC today.  Patient denies any chest pain or dyspnea currently.  Echocardiogram with normal LV systolic function.  Wendie Hamburg, MD

## 2023-12-19 ENCOUNTER — Ambulatory Visit: Payer: Self-pay | Admitting: Nurse Practitioner

## 2023-12-19 LAB — LIPOPROTEIN A (LPA): Lipoprotein (a): 23.1 nmol/L (ref <75.0)

## 2023-12-20 ENCOUNTER — Telehealth: Payer: Self-pay

## 2023-12-20 NOTE — Transitions of Care (Post Inpatient/ED Visit) (Signed)
   12/20/2023  Name: Paige Foster MRN: [id number] DOB: 1966/03/12  Today's TOC FU Call Status: Today's TOC FU Call Status:: Unsuccessful Call (1st Attempt) Unsuccessful Call (1st Attempt) Date: 12/20/23  Attempted to reach the patient regarding the most recent Inpatient/ED visit.  Follow Up Plan: Additional outreach attempts will be made to reach the patient to complete the Transitions of Care (Post Inpatient/ED visit) call.   Signature Germain Kohler, CMA (AAMA)  [facility] [phone]

## 2023-12-23 ENCOUNTER — Telehealth: Payer: Self-pay

## 2023-12-23 ENCOUNTER — Other Ambulatory Visit: Payer: Self-pay | Admitting: Hematology and Oncology

## 2023-12-23 NOTE — Telephone Encounter (Signed)
 Notified Patient of completion of FMLA and Disability Forms. Fax transmission confirmation received. Copy of forms placed for pick-up as requested. No other needs or concerns noted at this time.

## 2023-12-27 ENCOUNTER — Telehealth: Payer: Self-pay

## 2023-12-27 NOTE — Telephone Encounter (Signed)
 Left message to confirm appts for 6/4

## 2023-12-28 ENCOUNTER — Inpatient Hospital Stay: Attending: Hematology and Oncology

## 2023-12-28 ENCOUNTER — Inpatient Hospital Stay

## 2023-12-28 ENCOUNTER — Encounter: Payer: Self-pay | Admitting: Hematology and Oncology

## 2023-12-28 ENCOUNTER — Inpatient Hospital Stay (HOSPITAL_BASED_OUTPATIENT_CLINIC_OR_DEPARTMENT_OTHER): Admitting: Hematology and Oncology

## 2023-12-28 ENCOUNTER — Other Ambulatory Visit (HOSPITAL_COMMUNITY)

## 2023-12-28 ENCOUNTER — Inpatient Hospital Stay: Admitting: Nutrition

## 2023-12-28 ENCOUNTER — Telehealth: Payer: Self-pay | Admitting: *Deleted

## 2023-12-28 VITALS — BP 98/55 | HR 66 | Temp 98.9°F | Resp 17 | Wt 120.2 lb

## 2023-12-28 DIAGNOSIS — C50912 Malignant neoplasm of unspecified site of left female breast: Secondary | ICD-10-CM

## 2023-12-28 DIAGNOSIS — Z853 Personal history of malignant neoplasm of breast: Secondary | ICD-10-CM | POA: Insufficient documentation

## 2023-12-28 DIAGNOSIS — E860 Dehydration: Secondary | ICD-10-CM | POA: Diagnosis not present

## 2023-12-28 DIAGNOSIS — C50412 Malignant neoplasm of upper-outer quadrant of left female breast: Secondary | ICD-10-CM

## 2023-12-28 DIAGNOSIS — Z9013 Acquired absence of bilateral breasts and nipples: Secondary | ICD-10-CM | POA: Diagnosis not present

## 2023-12-28 DIAGNOSIS — C7951 Secondary malignant neoplasm of bone: Secondary | ICD-10-CM

## 2023-12-28 DIAGNOSIS — Z5111 Encounter for antineoplastic chemotherapy: Secondary | ICD-10-CM | POA: Diagnosis not present

## 2023-12-28 DIAGNOSIS — Z79811 Long term (current) use of aromatase inhibitors: Secondary | ICD-10-CM | POA: Diagnosis not present

## 2023-12-28 DIAGNOSIS — Z79899 Other long term (current) drug therapy: Secondary | ICD-10-CM | POA: Insufficient documentation

## 2023-12-28 DIAGNOSIS — Z170 Estrogen receptor positive status [ER+]: Secondary | ICD-10-CM

## 2023-12-28 DIAGNOSIS — C787 Secondary malignant neoplasm of liver and intrahepatic bile duct: Secondary | ICD-10-CM | POA: Diagnosis not present

## 2023-12-28 LAB — CMP (CANCER CENTER ONLY)
ALT: 23 U/L (ref 0–44)
AST: 23 U/L (ref 15–41)
Albumin: 3.8 g/dL (ref 3.5–5.0)
Alkaline Phosphatase: 142 U/L — ABNORMAL HIGH (ref 38–126)
Anion gap: 6 (ref 5–15)
BUN: 10 mg/dL (ref 6–20)
CO2: 24 mmol/L (ref 22–32)
Calcium: 8.8 mg/dL — ABNORMAL LOW (ref 8.9–10.3)
Chloride: 105 mmol/L (ref 98–111)
Creatinine: 0.62 mg/dL (ref 0.44–1.00)
GFR, Estimated: >60 mL/min (ref >60)
Glucose, Bld: 169 mg/dL — ABNORMAL HIGH (ref 70–99)
Potassium: 4.1 mmol/L (ref 3.5–5.1)
Sodium: 135 mmol/L (ref 135–145)
Total Bilirubin: 0.3 mg/dL (ref 0.0–1.2)
Total Protein: 6.7 g/dL (ref 6.5–8.1)

## 2023-12-28 LAB — CBC WITH DIFFERENTIAL (CANCER CENTER ONLY)
Abs Immature Granulocytes: 0.04 10*3/uL (ref 0.00–0.07)
Basophils Absolute: 0 10*3/uL (ref 0.0–0.1)
Basophils Relative: 0 %
Eosinophils Absolute: 0 10*3/uL (ref 0.0–0.5)
Eosinophils Relative: 0 %
HCT: 29.5 % — ABNORMAL LOW (ref 36.0–46.0)
Hemoglobin: 10.1 g/dL — ABNORMAL LOW (ref 12.0–15.0)
Immature Granulocytes: 1 %
Lymphocytes Relative: 15 %
Lymphs Abs: 0.7 10*3/uL (ref 0.7–4.0)
MCH: 31.5 pg (ref 26.0–34.0)
MCHC: 34.2 g/dL (ref 30.0–36.0)
MCV: 91.9 fL (ref 80.0–100.0)
Monocytes Absolute: 0.5 10*3/uL (ref 0.1–1.0)
Monocytes Relative: 10 %
Neutro Abs: 3.3 10*3/uL (ref 1.7–7.7)
Neutrophils Relative %: 74 %
Platelet Count: 348 10*3/uL (ref 150–400)
RBC: 3.21 MIL/uL — ABNORMAL LOW (ref 3.87–5.11)
RDW: 15.8 % — ABNORMAL HIGH (ref 11.5–15.5)
WBC Count: 4.5 10*3/uL (ref 4.0–10.5)
nRBC: 0 % (ref 0.0–0.2)

## 2023-12-28 MED ORDER — HEPARIN SOD (PORK) LOCK FLUSH 100 UNIT/ML IV SOLN
500.0000 [IU] | Freq: Once | INTRAVENOUS | Status: AC | PRN
  Administered 2023-12-28: 500 [IU]

## 2023-12-28 MED ORDER — HEPARIN SOD (PORK) LOCK FLUSH 100 UNIT/ML IV SOLN
250.0000 [IU] | Freq: Once | INTRAVENOUS | Status: DC | PRN
Start: 2023-12-28 — End: 2023-12-28

## 2023-12-28 MED ORDER — DEXAMETHASONE SODIUM PHOSPHATE 10 MG/ML IJ SOLN
10.0000 mg | Freq: Once | INTRAMUSCULAR | Status: AC
  Administered 2023-12-28: 10 mg via INTRAVENOUS
  Filled 2023-12-28: qty 1

## 2023-12-28 MED ORDER — SODIUM CHLORIDE 0.9 % IV SOLN: INTRAVENOUS | Status: DC

## 2023-12-28 MED ORDER — SODIUM CHLORIDE 0.9 % IV SOLN
75.0000 mg/m2 | Freq: Once | INTRAVENOUS | Status: DC
  Filled 2023-12-28: qty 12.1

## 2023-12-28 MED ORDER — SODIUM CHLORIDE 0.9 % IV SOLN
60.0000 mg/m2 | Freq: Once | INTRAVENOUS | Status: AC
  Administered 2023-12-28: 97 mg via INTRAVENOUS
  Filled 2023-12-28: qty 9.7

## 2023-12-28 MED ORDER — SODIUM CHLORIDE 0.9% FLUSH
10.0000 mL | Freq: Once | INTRAVENOUS | Status: AC | PRN
  Administered 2023-12-28: 10 mL

## 2023-12-28 MED ORDER — ACETAMINOPHEN 325 MG PO TABS
650.0000 mg | ORAL_TABLET | Freq: Once | ORAL | Status: AC
  Administered 2023-12-28: 650 mg via ORAL
  Filled 2023-12-28: qty 2

## 2023-12-28 MED ORDER — TRASTUZUMAB-ANNS CHEMO 150 MG IV SOLR
6.0000 mg/kg | Freq: Once | INTRAVENOUS | Status: AC
  Administered 2023-12-28: 336 mg via INTRAVENOUS
  Filled 2023-12-28: qty 16

## 2023-12-28 MED ORDER — DIPHENHYDRAMINE HCL 25 MG PO CAPS
50.0000 mg | ORAL_CAPSULE | Freq: Once | ORAL | Status: AC
  Administered 2023-12-28: 50 mg via ORAL
  Filled 2023-12-28: qty 2

## 2023-12-28 MED ORDER — SODIUM CHLORIDE 0.9 % IV SOLN
420.0000 mg | Freq: Once | INTRAVENOUS | Status: AC
  Administered 2023-12-28: 420 mg via INTRAVENOUS
  Filled 2023-12-28: qty 14

## 2023-12-28 MED ORDER — SODIUM CHLORIDE 0.9% FLUSH
10.0000 mL | INTRAVENOUS | Status: DC | PRN
  Administered 2023-12-28: 10 mL

## 2023-12-28 NOTE — Patient Instructions (Signed)
 [facility] - A DEPT OF [facility]  Discharge Instructions: Thank you for choosing [facility] to provide your oncology and hematology care.   If you have a lab appointment with the Cancer Center, please go directly to the Cancer Center and check in at the registration area.   Wear comfortable clothing and clothing appropriate for easy access to any Portacath or PICC line.   We strive to give you quality time with your provider. You may need to reschedule your appointment if you arrive late (15 or more minutes).  Arriving late affects you and other patients whose appointments are after yours.  Also, if you miss three or more appointments without notifying the office, you may be dismissed from the clinic at the provider's discretion.      For prescription refill requests, have your pharmacy contact our office and allow 72 hours for refills to be completed.    Today you received the following chemotherapy and/or immunotherapy agents: Trastuzumab , pertuzumab , and docetaxel       To help prevent nausea and vomiting after your treatment, we encourage you to take your nausea medication as directed.  BELOW ARE SYMPTOMS THAT SHOULD BE REPORTED IMMEDIATELY: *FEVER GREATER THAN 100.4 F (38 C) OR HIGHER *CHILLS OR SWEATING *NAUSEA AND VOMITING THAT IS NOT CONTROLLED WITH YOUR NAUSEA MEDICATION *UNUSUAL SHORTNESS OF BREATH *UNUSUAL BRUISING OR BLEEDING *URINARY PROBLEMS (pain or burning when urinating, or frequent urination) *BOWEL PROBLEMS (unusual diarrhea, constipation, pain near the anus) TENDERNESS IN MOUTH AND THROAT WITH OR WITHOUT PRESENCE OF ULCERS (sore throat, sores in mouth, or a toothache) UNUSUAL RASH, SWELLING OR PAIN  UNUSUAL VAGINAL DISCHARGE OR ITCHING   Items with * indicate a potential emergency and should be followed up as soon as possible or go to the Emergency Department if any problems should occur.  Please show the CHEMOTHERAPY  ALERT CARD or IMMUNOTHERAPY ALERT CARD at check-in to the Emergency Department and triage nurse.  Should you have questions after your visit or need to cancel or reschedule your appointment, please contact [facility] - A DEPT OF [facility]  Dept: [phone]  and follow the prompts.  Office hours are 8:00 a.m. to 4:30 p.m. Monday - Friday. Please note that voicemails left after 4:00 p.m. may not be returned until the following business day.  We are closed weekends and major holidays. You have access to a nurse at all times for urgent questions. Please call the main number to the clinic Dept: [phone] and follow the prompts.   For any non-urgent questions, you may also contact your provider using MyChart. We now offer e-Visits for anyone 66 and older to request care online for non-urgent symptoms. For details visit [email or web address].   Also download the MyChart app! Go to the app store, search "MyChart", open the app, select [facility], and log in with your MyChart username and password.

## 2023-12-28 NOTE — Progress Notes (Signed)
 58 yo female diagnosed with metastatic HER2 positive breast cancer.  She is followed by Dr. Arno Bibles.   CURRENT TREATMENT: anastrozole    Weight: 120 pounds 3.2 oz June 4 127 pounds May 2 126 pounds 11.2 oz April 4  5% weight loss over 1 month which is clinically significant.  Labs include Glucose 169.  Since her last visit, she was hospitalized for NSTEMI with elevated troponin, which were later thought to be related to demand ischemia. MD has arranged for IV fluids twice next week followed by once weekly and will monitor her closely.  Otherwise, she reports mild nausea, takes anti nausea medication once a day. No pain. No change in breathing, bowel habits or urinary habits.  Attempted nutrition follow-up during infusion. Patient politely declined follow-up.  She states "I do not feel the need for a dietitian follow-up today."  Please order nutrition consult as needed.  I have encouraged her to reach out to RD if she develops questions or concerns.  **Disclaimer: This note was dictated with voice recognition software. Similar sounding words can inadvertently be transcribed and this note may contain transcription errors which may not have been corrected upon publication of note.**

## 2023-12-28 NOTE — Telephone Encounter (Signed)
 This RN reviewed labs and vitals with patient pretreatment. Pt has no complaints and stated she is ready to start her treatment.  Pretreatment check list done and pt qued to chemo.

## 2023-12-28 NOTE — Progress Notes (Signed)
 Reduce taxotere  to 60mg /m2 today and future cycles per Dr Arno Bibles

## 2023-12-28 NOTE — Progress Notes (Signed)
 [facility]  [phone] [phone]    ID: Paige Foster DOB: February 22, 1966  MR#: [id number]  JYN#:[id number]  Patient Care Team: Kandace Organ, NP as PCP - General (Internal Medicine) Wendie Hamburg, MD as PCP - Cardiology (Cardiology) Enid Harry, MD as Consulting Physician (General Surgery) Nche, Connye Delaine, NP as Nurse Practitioner (Internal Medicine) Jearline Minder, MD as Consulting Physician (Ophthalmology) Dominic Friendly Cordelia Dessert, MD as Consulting Physician (Gastroenterology) [place] Patricia Boon [facility]) OTHER MD:   CHIEF COMPLAINT: 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  History of Present Illness  Paige Foster is a 58 year old female with HER2 positive breast cancer who presents for treatment planning.  She has been diagnosed with HER2 positive and estrogen receptor positive breast cancer. Since her last visit, she was hospitalized for NSTEMI with elevated troponin, which were later thought to be related to demand ischemia. Otherwise, She reports mild nausea, takes anti nausea medication once a day. No pain. No change in breathing, bowel habits or urinary habits.  Rest of the pertinent 10 point ROS reviewed and neg.  HISTORY OF CURRENT ILLNESS: From the original intake note:  Paige Foster has a prior history of left breast cancer, dating back to 2004. At that time she underwent a left mastectomy for stage 0 (noninvasive) breast cancer, with transverse rectus abdominis (TRAM) flap construction under Dr. Lindle Rhea. She also underwent a right breast reduction. She took tamoxifen for three years.  More recently she underwent bilateral diagnostic mammography with tomography and left breast ultrasonography at [facility] on  01/03/2018 showing: Breast Density Category B. There is an oval fat containing lesion in the left breast upper outer quadrant posterior depth. No other significant masses, calcifications, or other findings are seen in either breast. Sonographically, there is a 1.5 cm lesion in the left breast upper outer quadrant posterior depth. This lesion is of mixed echogenicity. This correlates as palpated and with mammography findings. Follow up was recommended.  Close follow-up was suggested.  She then presented with a non-tender mass in the left reconstructed breast on 08/30/2018. On physical exam, there is a hard palpable lump measuring 2.0 cm in the upper outer left reconstructed breast 10 cm from the expected location of a nipple. Sonography over this area demonstrates a 1.8 cm x 1.7 cm x 1.4 cm mass in the left breast at 2 o'clock posterior depth 10 cm from the nipple. This mass is of mixed echogenicity. This abnormality is increased in size and correlates as palpated and with prior mammography findings. Color flow imaging demonstrates that there is vascularity present. Elastography imaging assessment is intermediate. No significant abnormalities were seen sonographically in the left axilla.    Accordingly on 08/30/2018 she proceeded to biopsy of the left breast mass in question. The pathology from this procedure showed ([id number]): invasive ductal carcinoma, grade III. Prognostic indicators significant for: estrogen receptor, 100% positive with strong staining intensity and progesterone receptor, 0% negative. Proliferation marker Ki67 at 15%. HER2 positive (3+) by immunohistochemistry.  The patient's subsequent history is as detailed below.   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    Port-A-Cath in place 09/28/2018    Recurrent breast cancer, left [facility]) oncologist-- dr Charolett Copes    dx 2004, noninvasive Stage 0 ----s/p left mastectomy w/ tram flap construction (and right breast reduction), taken Tamoxifen for 3 yrs;   08-30-2018 recurrent left cancer , Grade III,  cT1c,  ER positive, PR negative, HER-2 positive, invasive ductal carcinoma-- neoadjuvant chemo to start 09-26-2018   Renal artery stenosis (HCC)    mild right external renal artery stenosis per duplex in epic 08-09-2013   Wears glasses     PAST SURGICAL HISTORY: Past Surgical History:  Procedure Laterality Date   BREAST LUMPECTOMY WITH RADIOACTIVE SEED LOCALIZATION Left 02/20/2019   Procedure: LEFT BREAST LUMPECTOMY WITH RADIOACTIVE SEED LOCALIZATION;  Surgeon: Enid Harry, MD;  Location: [facility] OR;  Service: General;  Laterality: Left;   BREAST SURGERY Left    Transflap   COLONOSCOPY     COLONOSCOPY     IR IMAGING GUIDED PORT INSERTION  11/25/2023   LEFT HEART CATH AND CORONARY ANGIOGRAPHY N/A 12/16/2023   Procedure: LEFT HEART CATH AND CORONARY ANGIOGRAPHY;  Surgeon: [place], Peter M, MD;  Location: [facility] INVASIVE CV LAB;  Service: Cardiovascular;  Laterality: N/A;   MASTECTOMY Left 2004   w/  TRAM flap construction and right breast augmentation with abdominoplasy   PARS PLANA VITRECTOMY Left 12/18/2018   Procedure: PARS PLANA VITRECTOMY WITH 25 GAUGE, ENDOLASER;  Surgeon: Jearline Minder, MD;  Location: [facility] OR;  Service: Ophthalmology;  Laterality: Left;   PORTACATH PLACEMENT N/A 09/25/2018   Procedure: INSERTION PORT-A-CATH WITH ULTRASOUND;  Surgeon: Enid Harry, MD;  Location: [facility] ORS;  Service: General;  Laterality: N/A;   TUBAL LIGATION Bilateral yrs ago    FAMILY HISTORY: Family History  Problem Relation Age of Onset   Diabetes Mother    Hypertension Mother    Kidney disease Father    Colon cancer Neg Hx    Colon polyps Neg Hx    Gallbladder disease Neg Hx    Heart disease Neg Hx    Esophageal cancer Neg Hx    Stomach cancer  Neg Hx    Rectal cancer Neg Hx   Paige Foster's father died from unknown causes in his early 70's. Patients' mother died from diabetes complications at age 59. The patient has 1 sister. Patient denies anyone in her family having breast, ovarian, prostate, or pancreatic cancer.    GYNECOLOGIC HISTORY:  Patient's last menstrual period was 06/08/2007. 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 Her husband, Marlou Sims, works at [employer]. Paige Foster has two children, Paige Foster and Paige Foster. Paige Foster lives with her, is 60, and it attending [employer] for a computer based degree. Paige Foster lives with her, is 35, and recently graduated from [employer] with a degree in Pension scheme manager.  He works for [employer]. Shamika has no grandchildren. She attends the [employer].   ADVANCED DIRECTIVES: In the absence of any documents to the contrary her husband, Marlou Sims, is automatically her healthcare power of attorney     HEALTH MAINTENANCE: Social History   Tobacco Use   Smoking status: Never    Passive exposure: Never   Smokeless tobacco: Never  Vaping Use   Vaping status: Never Used  Substance Use Topics   Alcohol use: Not  Currently    Alcohol/week: 0.0 standard drinks of alcohol    Comment: Occassionally   Drug use: No    Colonoscopy: April 2022, danis  PAP: January 2022, Nche  Bone density:  2017; -1.6, osteopenic   Allergies  Allergen Reactions   Bee Pollen Itching    Watery eyes and nose running    Current Outpatient Medications  Medication Sig Dispense Refill   amLODipine  (NORVASC ) 10 MG tablet Take 1 tablet by mouth daily. (Patient taking differently: Take 10 mg by mouth daily.) 90 tablet 2   anastrozole  (ARIMIDEX ) 1 MG tablet TAKE 1 TABLET(1 MG) BY MOUTH DAILY 90 tablet 4   atorvastatin  (LIPITOR) 20 MG tablet Take 1 tablet (20  mg total) by mouth daily. 90 tablet 3   brimonidine  (ALPHAGAN ) 0.2 % ophthalmic solution Place 1 drop into both eyes 3 (three) times daily.     dexamethasone  (DECADRON ) 4 MG tablet Take 2 tabs by mouth 2 times daily starting day before chemo. Then take 2 tabs daily for 2 days starting day after chemo. Take with food. 30 tablet 1   DOCEtaxel  (TAXOTERE ) 20 MG/ML CONC Inject 121 mg into the vein once.     dorzolamide -timolol  (COSOPT ) 2-0.5 % ophthalmic solution 1 drop 2 (two) times daily.     lidocaine -prilocaine  (EMLA ) cream Apply to affected area 1 hour before access prn 30 g 3   losartan  (COZAAR ) 50 MG tablet TAKE 1 TABLET(50 MG) BY MOUTH DAILY 90 tablet 3   ondansetron  (ZOFRAN ) 8 MG tablet Take 1 tablet (8 mg total) by mouth every 8 (eight) hours as needed for nausea or vomiting. 20 tablet 3   pegfilgrastim  (NEULASTA  ONPRO KIT) 6 MG/0.6ML injection Inject 6 mg into the skin once. Inject via provided programmed delivery device.     pertuzumab  (PERJETA ) 420 MG/14ML SOLN Inject 420 mg into the vein once.     prochlorperazine  (COMPAZINE ) 10 MG tablet Take 1 tablet (10 mg total) by mouth every 6 (six) hours as needed for nausea or vomiting. 30 tablet 1   trastuzumab -anns 336 mg in sodium chloride  0.9 % 250 mL Inject 336 mg into the vein once.     zoledronic  acid (ZOMETA ) 4 MG/5ML injection Inject 4 mg into the vein once.     No current facility-administered medications for this visit.   Facility-Administered Medications Ordered in Other Visits  Medication Dose Route Frequency Provider Last Rate Last Admin   0.9 %  sodium chloride  infusion   Intravenous Continuous Christiane Sistare, MD 10 mL/hr at 12/28/23 1305 New Bag at 12/28/23 1305   acetaminophen  (TYLENOL ) tablet 650 mg  650 mg Oral Once Dariush Mcnellis, MD       dexamethasone  (DECADRON ) injection 10 mg  10 mg Intravenous Once Chrystel Barefield, MD       diphenhydrAMINE  (BENADRYL ) capsule 50 mg  50 mg Oral Once Rylynn Schoneman, MD       DOCEtaxel   (TAXOTERE ) 121 mg in sodium chloride  0.9 % 250 mL chemo infusion  75 mg/m2 (Treatment Plan Recorded) Intravenous Once Lynea Rollison, MD       heparin  lock flush 100 unit/mL  500 Units Intracatheter Once PRN Sae Handrich, MD       pertuzumab  (PERJETA ) 420 mg in sodium chloride  0.9 % 250 mL chemo infusion  420 mg Intravenous Once Nashira Mcglynn, MD       sodium chloride  flush (NS) 0.9 % injection 10 mL  10 mL Intracatheter PRN Sheretha Shadd, MD  trastuzumab -anns (KANJINTI ) 336 mg in sodium chloride  0.9 % 250 mL chemo infusion  6 mg/kg (Treatment Plan Recorded) Intravenous Once Virgia Kelner, MD         OBJECTIVE: African-American woman who appears stated age  Vitals:   12/28/23 1147  BP: (!) 98/55  Pulse: 66  Resp: 17  Temp: 98.9 F (37.2 C)  SpO2: 99%       Body mass index is 20.63 kg/m.   Wt Readings from Last 3 Encounters:  12/28/23 120 lb 3.2 oz (54.5 kg)  12/15/23 119 lb 4.3 oz (54.1 kg)  12/02/23 125 lb (56.7 kg)   Physical Exam Constitutional:      Appearance: Normal appearance.  Cardiovascular:     Rate and Rhythm: Normal rate and regular rhythm.     Pulses: Normal pulses.     Heart sounds: Normal heart sounds.  Pulmonary:     Effort: Pulmonary effort is normal.     Breath sounds: Normal breath sounds.  Musculoskeletal:        General: No swelling.     Cervical back: Normal range of motion and neck supple. No rigidity.  Lymphadenopathy:     Cervical: No cervical adenopathy.  Skin:    General: Skin is warm and dry.  Neurological:     General: No focal deficit present.     Mental Status: She is alert.       LAB RESULTS:  CMP     Component Value Date/Time   NA 135 12/28/2023 1125   NA 140 10/12/2017 0950   K 4.1 12/28/2023 1125   CL 105 12/28/2023 1125   CO2 24 12/28/2023 1125   GLUCOSE 169 (H) 12/28/2023 1125   BUN 10 12/28/2023 1125   BUN 10 10/12/2017 0950   CREATININE 0.62 12/28/2023 1125   CALCIUM  8.8 (L) 12/28/2023 1125   PROT  6.7 12/28/2023 1125   PROT 7.5 10/12/2017 0950   ALBUMIN 3.8 12/28/2023 1125   ALBUMIN 4.3 10/12/2017 0950   AST 23 12/28/2023 1125   ALT 23 12/28/2023 1125   ALKPHOS 142 (H) 12/28/2023 1125   BILITOT 0.3 12/28/2023 1125   GFRNONAA >60 12/28/2023 1125   GFRAA >60 12/06/2019 1441   GFRAA >60 09/07/2018 1455   Lab Results  Component Value Date   WBC 4.5 12/28/2023   NEUTROABS 3.3 12/28/2023   HGB 10.1 (L) 12/28/2023   HCT 29.5 (L) 12/28/2023   MCV 91.9 12/28/2023   PLT 348 12/28/2023    Lab Results  Component Value Date   LABCA2 <4 08/30/2007    No components found for: "ZOXWRU045"  No results for input(s): "INR" in the last 168 hours.   Lab Results  Component Value Date   LABCA2 <4 08/30/2007    No results found for: "WUJ811"  No results found for: "CAN125"  Lab Results  Component Value Date   CAN153 23.9 10/27/2023    Lab Results  Component Value Date   CA2729 27.9 10/27/2023    No components found for: "HGQUANT"  No results found for: "CEA1", "CEA" / No results found for: "CEA1", "CEA"  No results found for: "AFPTUMOR"  No results found for: "CHROMOGRNA"  No results found for: "TOTALPROTELP", "ALBUMINELP", "A1GS", "A2GS", "BETS", "BETA2SER", "GAMS", "MSPIKE", "SPEI" (this displays SPEP labs)  No results found for: "KPAFRELGTCHN", "LAMBDASER", "KAPLAMBRATIO" (kappa/lambda light chains)  No results found for: "HGBA", "HGBA2QUANT", "HGBFQUANT", "HGBSQUAN" (Hemoglobinopathy evaluation)   Lab Results  Component Value Date   LDH 169 08/30/2007  Lab Results  Component Value Date   IRON 96 07/11/2023   TIBC 382 07/11/2023   IRONPCTSAT 25 07/11/2023   (Iron and TIBC)  Lab Results  Component Value Date   FERRITIN 261 07/11/2023    Urinalysis    Component Value Date/Time   COLORURINE STRAW (A) 12/15/2023 0809   APPEARANCEUR CLEAR 12/15/2023 0809   LABSPEC 1.010 12/15/2023 0809   PHURINE 5.0 12/15/2023 0809   GLUCOSEU NEGATIVE  12/15/2023 0809   HGBUR NEGATIVE 12/15/2023 0809   HGBUR large 04/09/2008 1548   BILIRUBINUR NEGATIVE 12/15/2023 0809   KETONESUR NEGATIVE 12/15/2023 0809   PROTEINUR NEGATIVE 12/15/2023 0809   UROBILINOGEN 0.2 04/09/2008 1548   NITRITE NEGATIVE 12/15/2023 0809   LEUKOCYTESUR NEGATIVE 12/15/2023 0809    STUDIES:  CARDIAC CATHETERIZATION Result Date: 12/16/2023 Normal coronary anatomy Low LVEDP 6 mm Hg Plan: no cardiac cause for elevated troponin. Suspect related to dehydration and medical illness  ECHOCARDIOGRAM LIMITED Result Date: 12/16/2023    ECHOCARDIOGRAM LIMITED REPORT   Patient Name:   Paige Foster Date of Exam: 12/16/2023 Medical Rec #:  [id number]       Height:       64.0 in Accession #:    [id number]      Weight:       119.3 lb Date of Birth:  1966-07-11       BSA:          1.570 m Patient Age:    58 years        BP:           112/80 mmHg Patient Gender: F               HR:           78 bpm. Exam Location:  Inpatient Procedure: Limited Echo, Limited Color Doppler, Cardiac Doppler, Strain Analysis            and 3D Echo (Both Spectral and Color Flow Doppler were utilized            during procedure). Indications:    CHF  History:        Patient has prior history of Echocardiogram examinations, most                 recent 11/29/2023. Risk Factors:Hypertension and Dyslipidemia.                 NSTEMI.  Sonographer:    Juanita Shaw Referring Phys: [id number] SHENG L HALEY IMPRESSIONS  1. Left ventricular ejection fraction, by estimation, is 65 to 70%. The left ventricle has normal function. The left ventricle has no regional wall motion abnormalities. The average left ventricular global longitudinal strain is -18.6 %. The global longitudinal strain is normal.  2. Right ventricular systolic function is normal. The right ventricular size is normal. There is mildly elevated pulmonary artery systolic pressure.  3. Left atrial size was mildly dilated.  4. The mitral valve is normal in structure.  Trivial mitral valve regurgitation. No evidence of mitral stenosis.  5. The aortic valve is tricuspid. Aortic valve regurgitation is not visualized. Aortic valve sclerosis is present, with no evidence of aortic valve stenosis.  6. The inferior vena cava is normal in size with greater than 50% respiratory variability, suggesting right atrial pressure of 3 mmHg. FINDINGS  Left Ventricle: Left ventricular ejection fraction, by estimation, is 65 to 70%. The left ventricle has normal function. The left ventricle has no regional wall motion abnormalities. The average  left ventricular global longitudinal strain is -18.6 %. Strain was performed and the global longitudinal strain is normal. The left ventricular internal cavity size was normal in size. There is no left ventricular hypertrophy. Right Ventricle: The right ventricular size is normal. Right ventricular systolic function is normal. There is mildly elevated pulmonary artery systolic pressure. The tricuspid regurgitant velocity is 3.06 m/s, and with an assumed right atrial pressure of 3 mmHg, the estimated right ventricular systolic pressure is 40.5 mmHg. Left Atrium: Left atrial size was mildly dilated. Right Atrium: Right atrial size was normal in size. Pericardium: There is no evidence of pericardial effusion. Mitral Valve: The mitral valve is normal in structure. Trivial mitral valve regurgitation. No evidence of mitral valve stenosis. Tricuspid Valve: The tricuspid valve is normal in structure. Tricuspid valve regurgitation is mild . No evidence of tricuspid stenosis. Aortic Valve: The aortic valve is tricuspid. Aortic valve regurgitation is not visualized. Aortic valve sclerosis is present, with no evidence of aortic valve stenosis. Pulmonic Valve: The pulmonic valve was normal in structure. Pulmonic valve regurgitation is not visualized. No evidence of pulmonic stenosis. Aorta: The aortic root is normal in size and structure. Venous: The inferior vena cava is  normal in size with greater than 50% respiratory variability, suggesting right atrial pressure of 3 mmHg. IAS/Shunts: No atrial level shunt detected by color flow Doppler. Additional Comments: 3D was performed not requiring image post processing on an independent workstation and was normal.  LEFT VENTRICLE PLAX 2D LVIDd:         4.80 cm LVIDs:         2.80 cm      2D Longitudinal Strain LV PW:         0.80 cm      2D Strain GLS Avg:     -18.6 % LV IVS:        0.60 cm  LV Volumes (MOD) LV vol d, MOD A2C: 89.2 ml LV vol d, MOD A4C: 120.0 ml LV vol s, MOD A2C: 30.9 ml LV vol s, MOD A4C: 39.2 ml LV SV MOD A2C:     58.3 ml LV SV MOD A4C:     120.0 ml LV SV MOD BP:      69.2 ml RIGHT VENTRICLE             IVC RV S prime:     14.60 cm/s  IVC diam: 1.20 cm TAPSE (M-mode): 3.4 cm TRICUSPID VALVE TR Peak grad:   37.5 mmHg TR Vmax:        306.00 cm/s Paige Angel MD Electronically signed by Paige Angel MD Signature Date/Time: 12/16/2023/10:03:45 AM    Final    CT Angio Chest Pulmonary Embolism (PE) W or WO Contrast Result Date: 12/15/2023 CLINICAL DATA:  Acute onset dizziness, weakness, and nausea. History of left-sided breast cancer status post left mastectomy with recurrent disease. * Tracking Code: BO * EXAM: CT ANGIOGRAPHY CHEST WITH CONTRAST TECHNIQUE: Multidetector CT imaging of the chest was performed using the standard protocol during bolus administration of intravenous contrast. Multiplanar CT image reconstructions and MIPs were obtained to evaluate the vascular anatomy. RADIATION DOSE REDUCTION: This exam was performed according to the departmental dose-optimization program which includes automated exposure control, adjustment of the mA and/or kV according to patient size and/or use of iterative reconstruction technique. CONTRAST:  75mL OMNIPAQUE  IOHEXOL  350 MG/ML SOLN COMPARISON:  Same day chest radiograph, CT chest dated 10/26/2023 FINDINGS: Cardiovascular: The study is high quality for the evaluation of  pulmonary embolism. There are no filling defects in the central, lobar, segmental or subsegmental pulmonary artery branches to suggest acute pulmonary embolism. Great vessels are normal in course and caliber. Normal heart size. No significant pericardial fluid/thickening. Aortic atherosclerosis. Mediastinum/Nodes: Imaged thyroid  gland without nodules meeting criteria for imaging follow-up by size. Normal esophagus. No pathologically enlarged axillary, supraclavicular, mediastinal, or hilar lymph nodes. Lungs/Pleura: The central airways are patent. No focal consolidation. No pneumothorax. Small left pleural effusion. Upper abdomen: Innumerable hepatic metastases, better evaluated on prior CT abdomen and pelvis. Musculoskeletal: Postsurgical changes of the left breast. Peripherally enhancing clinic mass is again seen, measuring 6.5 x 3.8 cm, previously 5.9 x 3.4 cm (remeasured), located within the left inferior anterolateral seventh rib. Multiple additional lytic rib lesions are increased in conspicuity involving the right lateral seventh and posterolateral ninth as well as left anterolateral fourth ribs. Increased conspicuity of multifocal right scapular lesions. Asymmetric sclerosis of the medial right clavicular head. Multifocal thoracic spinal metastases are better evaluated on prior MRI. Review of the MIP images confirms the above findings. IMPRESSION: 1. No evidence of pulmonary embolism. 2. Small left pleural effusion. 3. Increased size of left anterolateral rib lesion and conspicuity of additional multifocal osseous metastases. 4. Innumerable hepatic metastases, better evaluated on prior CT abdomen and pelvis. Aortic Atherosclerosis (ICD10-I70.0). Electronically Signed   By: Limin  Xu M.D.   On: 12/15/2023 15:48   DG Chest 2 View Result Date: 12/15/2023 CLINICAL DATA:  Shortness of breath. EXAM: CHEST - 2 VIEW COMPARISON:  10/17/2023 FINDINGS: Right chest wall port a catheter is noted with tip in the  superior cavoatrial junction. Heart size and mediastinal contours are unremarkable. Mildly increased interstitial opacities identified throughout both lungs. No pleural fluid. Left lateral destructive rib lesion is again noted. Surgical clips identified in the left axilla. IMPRESSION: 1. Mildly increased interstitial opacities throughout both lungs. Differential considerations include mild interstitial edema versus atypical infection. 2. Left lateral destructive rib lesion. Electronically Signed   By: Kimberley Penman M.D.   On: 12/15/2023 07:33   ECHOCARDIOGRAM COMPLETE Result Date: 11/29/2023    ECHOCARDIOGRAM REPORT   Patient Name:   Paige Foster Date of Exam: 11/29/2023 Medical Rec #:  [id number]       Height:       64.0 in Accession #:    [id number]      Weight:       127.0 lb Date of Birth:  January 25, 1966       BSA:          1.613 m Patient Age:    58 years        BP:           122/70 mmHg Patient Gender: F               HR:           66 bpm. Exam Location:  Outpatient Procedure: 2D Echo, 3D Echo, Cardiac Doppler, Color Doppler and Strain Analysis            (Both Spectral and Color Flow Doppler were utilized during            procedure). Indications:    Z51.11 Encounter for antineoplastic chemotheraphy  History:        Patient has prior history of Echocardiogram examinations, most                 recent 08/02/2019. Risk Factors:Hypertension, Dyslipidemia and  Non-Smoker. Patient denies chest pain, SOB and leg edema.                 History of left breast cancer with metastasis to liver.  Sonographer:    Richarda Chance RVT, RDCS (AE), RDMS Referring Phys: [id number] Nashaly Dorantes IMPRESSIONS  1. Left ventricular ejection fraction, by estimation, is 60 to 65%. Left ventricular ejection fraction by 3D volume is 61 %. The left ventricle has normal function. The left ventricle has no regional wall motion abnormalities. There is mild left ventricular hypertrophy of the basal-septal segment. Left ventricular  diastolic parameters were normal. The average left ventricular global longitudinal strain is -18.9 %. The global longitudinal strain is normal.  2. Right ventricular systolic function is normal. The right ventricular size is normal. There is normal pulmonary artery systolic pressure. The estimated right ventricular systolic pressure is 31.5 mmHg.  3. Left atrial size was mildly dilated.  4. The mitral valve is normal in structure. Trivial mitral valve regurgitation. No evidence of mitral stenosis.  5. Tricuspid valve regurgitation is moderate.  6. The aortic valve is normal in structure. Aortic valve regurgitation is not visualized. No aortic stenosis is present.  7. The inferior vena cava is normal in size with greater than 50% respiratory variability, suggesting right atrial pressure of 3 mmHg. Comparison(s): EF 60%, GLS -19.7%. FINDINGS  Left Ventricle: Left ventricular ejection fraction, by estimation, is 60 to 65%. Left ventricular ejection fraction by 3D volume is 61 %. The left ventricle has normal function. The left ventricle has no regional wall motion abnormalities. The average left ventricular global longitudinal strain is -18.9 %. Strain was performed and the global longitudinal strain is normal. The left ventricular internal cavity size was normal in size. There is mild left ventricular hypertrophy of the basal-septal segment. Left ventricular diastolic parameters were normal. Normal left ventricular filling pressure. Right Ventricle: The right ventricular size is normal. No increase in right ventricular wall thickness. Right ventricular systolic function is normal. There is normal pulmonary artery systolic pressure. The tricuspid regurgitant velocity is 2.67 m/s, and  with an assumed right atrial pressure of 3 mmHg, the estimated right ventricular systolic pressure is 31.5 mmHg. Left Atrium: Left atrial size was mildly dilated. Right Atrium: Right atrial size was normal in size. Pericardium: There is  no evidence of pericardial effusion. Mitral Valve: The mitral valve is normal in structure. Trivial mitral valve regurgitation. No evidence of mitral valve stenosis. Tricuspid Valve: The tricuspid valve is normal in structure. Tricuspid valve regurgitation is moderate . No evidence of tricuspid stenosis. Aortic Valve: The aortic valve is normal in structure. Aortic valve regurgitation is not visualized. No aortic stenosis is present. Aortic valve mean gradient measures 4.0 mmHg. Aortic valve peak gradient measures 10.9 mmHg. Aortic valve area, by VTI measures 2.00 cm. Pulmonic Valve: The pulmonic valve was normal in structure. Pulmonic valve regurgitation is trivial. No evidence of pulmonic stenosis. Aorta: The aortic root is normal in size and structure. Venous: The inferior vena cava is normal in size with greater than 50% respiratory variability, suggesting right atrial pressure of 3 mmHg. IAS/Shunts: No atrial level shunt detected by color flow Doppler. Additional Comments: 3D was performed not requiring image post processing on an independent workstation and was normal.  LEFT VENTRICLE PLAX 2D LVIDd:         4.64 cm         Diastology LVIDs:         2.89 cm  LV e' medial:    10.40 cm/s LV PW:         0.69 cm         LV E/e' medial:  8.0 LV IVS:        1.13 cm         LV e' lateral:   19.40 cm/s LVOT diam:     2.00 cm         LV E/e' lateral: 4.3 LV SV:         61 LV SV Index:   38              2D Longitudinal LVOT Area:     3.14 cm        Strain                                2D Strain GLS   -22.2 %                                (A4C):                                2D Strain GLS   -16.1 %                                (A3C):                                2D Strain GLS   -18.5 %                                (A2C):                                2D Strain GLS   -18.9 %                                Avg:                                 3D Volume EF                                LV 3D EF:    Left                                              ventricul                                             ar  ejection                                             fraction                                             by 3D                                             volume is                                             61 %.                                 3D Volume EF:                                3D EF:        61 %                                LV EDV:       165 ml                                LV ESV:       63 ml                                LV SV:        101 ml RIGHT VENTRICLE RV S prime:     13.10 cm/s TAPSE (M-mode): 3.0 cm LEFT ATRIUM             Index        RIGHT ATRIUM           Index LA diam:        4.17 cm 2.59 cm/m   RA Area:     13.20 cm LA Vol (A2C):   59.8 ml 37.07 ml/m  RA Volume:   27.70 ml  17.17 ml/m LA Vol (A4C):   52.1 ml 32.30 ml/m LA Biplane Vol: 57.3 ml 35.52 ml/m  AORTIC VALVE                    PULMONIC VALVE AV Area (Vmax):    2.04 cm     PV Vmax:       0.95 m/s AV Area (Vmean):   2.11 cm     PV Peak grad:  3.6 mmHg AV Area (VTI):     2.00 cm AV Vmax:           165.00 cm/s AV Vmean:          92.900 cm/s AV VTI:  0.304 m AV Peak Grad:      10.9 mmHg AV Mean Grad:      4.0 mmHg LVOT Vmax:         107.00 cm/s LVOT Vmean:        62.300 cm/s LVOT VTI:          0.194 m LVOT/AV VTI ratio: 0.64  AORTA Ao Root diam: 2.80 cm Ao Asc diam:  2.99 cm Ao Arch diam: 2.7 cm MITRAL VALVE               TRICUSPID VALVE MV Area (PHT): 3.93 cm    TR Peak grad:   28.5 mmHg MV Decel Time: 193 msec    TR Vmax:        267.00 cm/s MV E velocity: 83.60 cm/s MV A velocity: 80.40 cm/s  SHUNTS MV E/A ratio:  1.04        Systemic VTI:  0.19 m                            Systemic Diam: 2.00 cm Gaylyn Keas MD Electronically signed by Gaylyn Keas MD Signature Date/Time: 11/29/2023/10:53:21 AM    Final      ELIGIBLE FOR AVAILABLE RESEARCH PROTOCOL:  No  ASSESSMENT: 58 y.o. [place], [place] woman  (1) history of left-sided ductal carcinoma in situ 2004  (a) s/p left mastectomy with TRAM reconstruction  (b) status post tamoxifen x3 years  (2) left breast upper outer quadrant biopsy 08/30/2018 shows a clinical T1c N0 invasive ductal carcinoma, grade 3, estrogen receptor strongly positive, progesterone receptor negative, with HER-2 amplification, and and MIB-1 of 15%.   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    (b) chemotherapy discontinued after 4 cycles because of intercurrent eye surgery  (c) continued trastuzumab  and pertuzumab  to complete a year (last dose 09/06/2019)  (d) echocardiogram on 01/23/2019 that shows well preserved EF of 60-65%  (e) echocardiogram on 04/23/2019 shows EF of 60-65%  (f) echocardiogram 08/02/2019 shows an ejection fraction in the 60-65% range  (4) left lumpectomy 02/20/2019 showed a residual  ypT1c NX invasive ductal carcinoma, grade 2 with negative margins.    (5) adjuvant radiation: Radiation Treatment Dates: 04/12/2019 through 05/28/2019 Site Technique Total Dose (Gy) Dose per Fx (Gy) Completed Fx Beam Energies  Breast: CW_Lt 3D 50.4/50.4 1.8 28/28 6X, 10X  Breast: CW_Lt_SCV_PAB 3D 50.4/50.4 1.8 28/28 6X, 10X  Breast: CW_Lt_Bst Electron 10/10 2 5/5 6X, 10X   (6) anastrozole  started 06/26/2019  (a) DEXA scan at [facility] 12/24/2015 found a T score of -1.6 DEXA scan from mass June 2023 showed T score of -1.4 at L1-L2  (7) genetics testing 02/03/2019 through the Common Hereditary Cancers Panel offered by [facility] found no deleterious mutations in APC, ATM, AXIN2, BARD1, BMPR1A, BRCA1,  BRCA2, BRIP1, CDH1, CDKN2A (p14ARF), CDKN2A (p16INK4a), CKD4, CHEK2, CTNNA1, DICER1, EPCAM (Deletion/duplication testing only), GREM1 (promoter region deletion/duplication testing only), KIT, MEN1, MLH1, MSH2, MSH3, MSH6, MUTYH, NBN, NF1, NHTL1, PALB2, PDGFRA, PMS2, POLD1, POLE, PTEN, RAD50, RAD51C, RAD51D, RNF43, SDHB, SDHC, SDHD, SMAD4, SMARCA4. STK11, TP53, TSC1, TSC2, and VHL.  The following genes were evaluated for sequence changes only: SDHA and HOXB13 c.251G>A variant  only.  (8) She had a CXR for unintentional weight loss,  this showed possible left lung pleural based mass and posterior right eighth rib met lesion. CT showed 3.8 x 5.5 x 5 cm left anterior chest wall mass along the mid axillary line with rib involvement and destruction and low-attenuation changes of the center indicating necrotic mass correlates with a neoplastic lesion likely metastatic disease. Liver is enlarged, inhomogeneous with multiple enhancing lesions throughout the right and left lobe of the liver consistent with extensive numerous metastatic liver lesions. Liver appears completely replaced by metastatic disease. Lytic metastatic bone lesions involving the right anterior superior iliac crest, left iliac bone, right inferior pubic ramus, left iliac bone, and L4 vertebral body. Suggestive of lytic bone changes involving the upper thoracic vertebral bodies T1-T2.  PLAN:  Assessment & Plan Metastatic Breast Cancer  HER2 positive and estrogen receptor positive breast cancer confirmed. Standard treatment involves chemotherapy and antibodies, docetaxel  with HP. Previous chemotherapy well-tolerated. Neuropathy resolved. Discussed neuropathy concerns with docetaxel .  - Order docetaxel  with herceptin  and perjeta , intent of treatment is palliative. - Administer chemotherapy every 21 days for six cycles. - Plan for re scans after C3. - Zometa  every 12 weeks, next due 02/19/2024. - Labs reviewed, satisfactory to proceed. - Given  severe diarrhea leading to demand ischemia, we will also dose reduce the docetaxel  to 60 mg/m - We will arrange for IV fluids twice next week followed by once weekly and monitor her closely  Time spent:30 min   Murleen Arms, MD  Medical Oncology and Hematology [facility] [address] [place], [place] [address] Tel. [phone]    Fax. [phone]  *Total Encounter Time as defined by the Centers for Medicare and Medicaid Services includes, in addition to the face-to-face time of a patient visit (documented in the note above) non-face-to-face time: obtaining and reviewing outside history, ordering and reviewing medications, tests or procedures, care coordination (communications with other health care professionals or caregivers) and documentation in the medical record.

## 2023-12-30 ENCOUNTER — Inpatient Hospital Stay

## 2023-12-30 ENCOUNTER — Other Ambulatory Visit: Payer: Self-pay

## 2023-12-30 VITALS — BP 104/64 | HR 79 | Resp 16

## 2023-12-30 DIAGNOSIS — Z9013 Acquired absence of bilateral breasts and nipples: Secondary | ICD-10-CM | POA: Diagnosis not present

## 2023-12-30 DIAGNOSIS — Z170 Estrogen receptor positive status [ER+]: Secondary | ICD-10-CM

## 2023-12-30 DIAGNOSIS — C50412 Malignant neoplasm of upper-outer quadrant of left female breast: Secondary | ICD-10-CM | POA: Diagnosis not present

## 2023-12-30 DIAGNOSIS — C787 Secondary malignant neoplasm of liver and intrahepatic bile duct: Secondary | ICD-10-CM | POA: Diagnosis not present

## 2023-12-30 DIAGNOSIS — Z79899 Other long term (current) drug therapy: Secondary | ICD-10-CM | POA: Diagnosis not present

## 2023-12-30 DIAGNOSIS — Z853 Personal history of malignant neoplasm of breast: Secondary | ICD-10-CM | POA: Diagnosis not present

## 2023-12-30 DIAGNOSIS — C50912 Malignant neoplasm of unspecified site of left female breast: Secondary | ICD-10-CM

## 2023-12-30 DIAGNOSIS — Z79811 Long term (current) use of aromatase inhibitors: Secondary | ICD-10-CM | POA: Diagnosis not present

## 2023-12-30 DIAGNOSIS — C7951 Secondary malignant neoplasm of bone: Secondary | ICD-10-CM | POA: Diagnosis not present

## 2023-12-30 DIAGNOSIS — Z5111 Encounter for antineoplastic chemotherapy: Secondary | ICD-10-CM | POA: Diagnosis not present

## 2023-12-30 DIAGNOSIS — E860 Dehydration: Secondary | ICD-10-CM | POA: Diagnosis not present

## 2023-12-30 MED ORDER — PEGFILGRASTIM-CBQV 6 MG/0.6ML ~~LOC~~ SOSY
6.0000 mg | PREFILLED_SYRINGE | Freq: Once | SUBCUTANEOUS | Status: AC
  Administered 2023-12-30: 6 mg via SUBCUTANEOUS
  Filled 2023-12-30: qty 0.6

## 2023-12-30 NOTE — Progress Notes (Signed)
 Received message from infusion charge RN asking for IVF orders for pt  infusion appt 01/02/24. Orders placed per NP. Message sent to MD to request supportive therapy plan.

## 2024-01-01 ENCOUNTER — Other Ambulatory Visit: Payer: Self-pay

## 2024-01-02 ENCOUNTER — Other Ambulatory Visit: Payer: Self-pay | Admitting: Hematology and Oncology

## 2024-01-02 ENCOUNTER — Inpatient Hospital Stay

## 2024-01-02 VITALS — BP 110/56 | HR 79 | Temp 97.9°F | Resp 18

## 2024-01-02 DIAGNOSIS — C787 Secondary malignant neoplasm of liver and intrahepatic bile duct: Secondary | ICD-10-CM | POA: Diagnosis not present

## 2024-01-02 DIAGNOSIS — C7951 Secondary malignant neoplasm of bone: Secondary | ICD-10-CM | POA: Diagnosis not present

## 2024-01-02 DIAGNOSIS — Z5111 Encounter for antineoplastic chemotherapy: Secondary | ICD-10-CM | POA: Diagnosis not present

## 2024-01-02 DIAGNOSIS — Z79899 Other long term (current) drug therapy: Secondary | ICD-10-CM | POA: Diagnosis not present

## 2024-01-02 DIAGNOSIS — Z79811 Long term (current) use of aromatase inhibitors: Secondary | ICD-10-CM | POA: Diagnosis not present

## 2024-01-02 DIAGNOSIS — E860 Dehydration: Secondary | ICD-10-CM | POA: Diagnosis not present

## 2024-01-02 DIAGNOSIS — C50412 Malignant neoplasm of upper-outer quadrant of left female breast: Secondary | ICD-10-CM

## 2024-01-02 DIAGNOSIS — Z9013 Acquired absence of bilateral breasts and nipples: Secondary | ICD-10-CM | POA: Diagnosis not present

## 2024-01-02 DIAGNOSIS — Z853 Personal history of malignant neoplasm of breast: Secondary | ICD-10-CM | POA: Diagnosis not present

## 2024-01-02 DIAGNOSIS — Z170 Estrogen receptor positive status [ER+]: Secondary | ICD-10-CM | POA: Diagnosis not present

## 2024-01-02 MED ORDER — SODIUM CHLORIDE 0.9 % IV SOLN: Freq: Once | INTRAVENOUS | Status: AC

## 2024-01-02 MED ORDER — SODIUM CHLORIDE 0.9% FLUSH
3.0000 mL | Freq: Once | INTRAVENOUS | Status: AC | PRN
  Administered 2024-01-02: 3 mL

## 2024-01-02 MED ORDER — HEPARIN SOD (PORK) LOCK FLUSH 100 UNIT/ML IV SOLN
500.0000 [IU] | Freq: Once | INTRAVENOUS | Status: AC | PRN
  Administered 2024-01-02: 500 [IU]

## 2024-01-02 NOTE — Patient Instructions (Signed)

## 2024-01-03 ENCOUNTER — Other Ambulatory Visit: Payer: Self-pay | Admitting: *Deleted

## 2024-01-05 ENCOUNTER — Other Ambulatory Visit: Payer: Self-pay | Admitting: Hematology and Oncology

## 2024-01-05 ENCOUNTER — Inpatient Hospital Stay

## 2024-01-05 ENCOUNTER — Other Ambulatory Visit: Payer: Self-pay

## 2024-01-05 VITALS — BP 96/59 | HR 66 | Temp 98.8°F | Resp 16

## 2024-01-05 DIAGNOSIS — C7951 Secondary malignant neoplasm of bone: Secondary | ICD-10-CM | POA: Diagnosis not present

## 2024-01-05 DIAGNOSIS — Z79899 Other long term (current) drug therapy: Secondary | ICD-10-CM | POA: Diagnosis not present

## 2024-01-05 DIAGNOSIS — E860 Dehydration: Secondary | ICD-10-CM | POA: Diagnosis not present

## 2024-01-05 DIAGNOSIS — Z170 Estrogen receptor positive status [ER+]: Secondary | ICD-10-CM

## 2024-01-05 DIAGNOSIS — C787 Secondary malignant neoplasm of liver and intrahepatic bile duct: Secondary | ICD-10-CM | POA: Diagnosis not present

## 2024-01-05 DIAGNOSIS — Z79811 Long term (current) use of aromatase inhibitors: Secondary | ICD-10-CM | POA: Diagnosis not present

## 2024-01-05 DIAGNOSIS — Z853 Personal history of malignant neoplasm of breast: Secondary | ICD-10-CM | POA: Diagnosis not present

## 2024-01-05 DIAGNOSIS — Z9013 Acquired absence of bilateral breasts and nipples: Secondary | ICD-10-CM | POA: Diagnosis not present

## 2024-01-05 DIAGNOSIS — Z5111 Encounter for antineoplastic chemotherapy: Secondary | ICD-10-CM | POA: Diagnosis not present

## 2024-01-05 DIAGNOSIS — C50412 Malignant neoplasm of upper-outer quadrant of left female breast: Secondary | ICD-10-CM | POA: Diagnosis not present

## 2024-01-05 LAB — BASIC METABOLIC PANEL - CANCER CENTER ONLY
Anion gap: 6 (ref 5–15)
BUN: 11 mg/dL (ref 6–20)
CO2: 25 mmol/L (ref 22–32)
Calcium: 8.5 mg/dL — ABNORMAL LOW (ref 8.9–10.3)
Chloride: 105 mmol/L (ref 98–111)
Creatinine: 0.74 mg/dL (ref 0.44–1.00)
GFR, Estimated: >60 mL/min (ref >60)
Glucose, Bld: 99 mg/dL (ref 70–99)
Potassium: 3.8 mmol/L (ref 3.5–5.1)
Sodium: 136 mmol/L (ref 135–145)

## 2024-01-05 LAB — MAGNESIUM: Magnesium: 1.6 mg/dL — ABNORMAL LOW (ref 1.7–2.4)

## 2024-01-05 MED ORDER — SODIUM CHLORIDE 0.9% FLUSH
10.0000 mL | Freq: Once | INTRAVENOUS | Status: AC | PRN
  Administered 2024-01-05: 10 mL

## 2024-01-05 MED ORDER — HEPARIN SOD (PORK) LOCK FLUSH 100 UNIT/ML IV SOLN
500.0000 [IU] | Freq: Once | INTRAVENOUS | Status: AC | PRN
  Administered 2024-01-05: 500 [IU]

## 2024-01-05 MED ORDER — MAGNESIUM OXIDE -MG SUPPLEMENT 400 (240 MG) MG PO TABS: 400.0000 mg | ORAL_TABLET | Freq: Two times a day (BID) | ORAL | 0 refills | Status: DC

## 2024-01-05 MED ORDER — SODIUM CHLORIDE 0.9 % IV SOLN: INTRAVENOUS | Status: AC

## 2024-01-05 NOTE — Patient Instructions (Signed)

## 2024-01-05 NOTE — Progress Notes (Signed)
 Pt instructed to monitor BP and hold BP medications if levels are under 110/60 (Per Iruku MD). Pt also instructed to make appt with PCP to evaluate BP meds. Pt expressed understanding of instructions.   PO Magnesium prescription sent in and pt notified of new prescription.

## 2024-01-05 NOTE — Progress Notes (Signed)
 S/w pt and given instructions how to take PO Mag. She verbalized understanding and requests medication be sent to [facility] on [address] in [place]. Order placed per MD.

## 2024-01-09 ENCOUNTER — Inpatient Hospital Stay

## 2024-01-09 DIAGNOSIS — Z5111 Encounter for antineoplastic chemotherapy: Secondary | ICD-10-CM | POA: Diagnosis not present

## 2024-01-09 DIAGNOSIS — C7951 Secondary malignant neoplasm of bone: Secondary | ICD-10-CM

## 2024-01-09 DIAGNOSIS — Z853 Personal history of malignant neoplasm of breast: Secondary | ICD-10-CM | POA: Diagnosis not present

## 2024-01-09 DIAGNOSIS — Z79899 Other long term (current) drug therapy: Secondary | ICD-10-CM | POA: Diagnosis not present

## 2024-01-09 DIAGNOSIS — C50412 Malignant neoplasm of upper-outer quadrant of left female breast: Secondary | ICD-10-CM | POA: Diagnosis not present

## 2024-01-09 DIAGNOSIS — Z79811 Long term (current) use of aromatase inhibitors: Secondary | ICD-10-CM | POA: Diagnosis not present

## 2024-01-09 DIAGNOSIS — Z9013 Acquired absence of bilateral breasts and nipples: Secondary | ICD-10-CM | POA: Diagnosis not present

## 2024-01-09 DIAGNOSIS — Z170 Estrogen receptor positive status [ER+]: Secondary | ICD-10-CM | POA: Diagnosis not present

## 2024-01-09 DIAGNOSIS — C787 Secondary malignant neoplasm of liver and intrahepatic bile duct: Secondary | ICD-10-CM | POA: Diagnosis not present

## 2024-01-09 DIAGNOSIS — E860 Dehydration: Secondary | ICD-10-CM | POA: Diagnosis not present

## 2024-01-09 MED ORDER — POTASSIUM CHLORIDE IN NACL 20-0.9 MEQ/L-% IV SOLN: INTRAVENOUS | Status: DC

## 2024-01-09 NOTE — Patient Instructions (Signed)
 Dehydration, Adult Dehydration is a condition in which there is not enough water or other fluids in the body. This happens when a person loses more fluids than they take in. Important organs cannot work right without the right amount of fluids. Any loss of fluids from the body can cause dehydration. Dehydration can be mild, worse, or very bad. It should be treated right away to keep it from getting very bad. What are the causes? Conditions that cause loss of water in the body. They include: Watery poop (diarrhea). Vomiting. Sweating a lot. Fever. Infection. Peeing (urinating) a lot. Not drinking enough fluids. Certain medicines, such as medicines that take extra fluid out of the body (diuretics). Lack of safe drinking water. Not being able to get enough water and food. What increases the risk? Having a long-term (chronic) illness that has not been treated the right way, such as: Diabetes. Heart disease. Kidney disease. Being 25 years of age or older. Having a disability. Living in a place that is high above the ground or sea (high in altitude). The thinner, drier air causes more fluid loss. Doing exercises that put stress on your body for a long time. Being active when in hot places. What are the signs or symptoms? Symptoms of dehydration depend on how bad it is. Mild or worse dehydration Thirst. Dry lips or dry mouth. Feeling dizzy or light-headed. Muscle cramps. Passing little pee or dark pee. Pee may be the color of tea. Headache. Very bad dehydration Changes in skin. Skin may: Be cold to the touch (clammy). Be blotchy or pale. Not go back to normal right after you pinch it and let it go. Little or no tears, pee, or sweat. Fast breathing. Low blood pressure. Weak pulse. Pulse that is more than 100 beats a minute when you are sitting still. Other changes, such as: Feeling very thirsty. Eyes that look hollow (sunken). Cold hands and feet. Being confused. Being very  tired (lethargic) or having trouble waking from sleep. Losing weight. Loss of consciousness. How is this treated? Treatment for this condition depends on how bad your dehydration is. Treatment should start right away. Do not wait until your condition gets very bad. Very bad dehydration is an emergency. You will need to go to a hospital. Mild or worse dehydration can be treated at home. You may be asked to: Drink more fluids. Drink an oral rehydration solution (ORS). This drink gives you the right amount of fluids, salts, and minerals (electrolytes). Very bad dehydration can be treated: With fluids through an IV tube. By correcting low levels of electrolytes in the body. By treating the problem that caused your dehydration. Follow these instructions at home: Oral rehydration solution If told by your doctor, drink an ORS: Make an ORS. Use instructions on the package. Start by drinking small amounts, about  cup (120 mL) every 5-10 minutes. Slowly drink more until you have had the amount that your doctor said to have.  Eating and drinking  Drink enough clear fluid to keep your pee pale yellow. If you were told to drink an ORS, finish the ORS first. Then, start slowly drinking other clear fluids. Drink fluids such as: Water. Do not drink only water. Doing that can make the salt (sodium) level in your body get too low. Water from ice chips you suck on. Fruit juice that you have added water to (diluted). Low-calorie sports drinks. Eat foods that have the right amounts of salts and minerals, such as bananas, oranges, potatoes,  tomatoes, or spinach. Do not drink alcohol. Avoid drinks that have caffeine or sugar. These include:: High-calorie sports drinks. Fruit juice that you did not add water to. Soda. Coffee or energy drinks. Avoid foods that are greasy or have a lot of fat or sugar. General instructions Take over-the-counter and prescription medicines only as told by your doctor. Do  not take sodium tablets. Doing that can make the salt level in your body get too high. Return to your normal activities as told by your doctor. Ask your doctor what activities are safe for you. Keep all follow-up visits. Your doctor may check and change your treatment. Contact a doctor if: You have pain in your belly (abdomen) and the pain: Gets worse. Stays in one place. You have a rash. You have a stiff neck. You get angry or annoyed more easily than normal. You are more tired or have a harder time waking than normal. You feel weak or dizzy. You feel very thirsty. Get help right away if: You have any symptoms of very bad dehydration. You vomit every time you eat or drink. Your vomiting gets worse, does not go away, or you vomit blood or green stuff. You are getting treatment, but symptoms are getting worse. You have a fever. You have a very bad headache. You have: Diarrhea that gets worse or does not go away. Blood in your poop (stool). This may cause poop to look black and tarry. No pee in 6-8 hours. Only a small amount of pee in 6-8 hours, and the pee is very dark. You have trouble breathing. These symptoms may be an emergency. Get help right away. Call 911. Do not wait to see if the symptoms will go away. Do not drive yourself to the hospital. This information is not intended to replace advice given to you by your health care provider. Make sure you discuss any questions you have with your health care provider. Document Revised: 02/08/2022 Document Reviewed: 02/08/2022 Elsevier Patient Education  2024 [employer].

## 2024-01-10 ENCOUNTER — Telehealth: Payer: Self-pay

## 2024-01-10 ENCOUNTER — Ambulatory Visit
Admission: RE | Admit: 2024-01-10 | Discharge: 2024-01-10 | Disposition: A | Discharge status: Home / Self Care | Source: Ambulatory Visit | Attending: Radiology | Admitting: Radiology

## 2024-01-10 VITALS — BP 104/60 | HR 63 | Temp 98.4°F | Resp 18 | Ht 64.0 in | Wt 121.2 lb

## 2024-01-10 DIAGNOSIS — C7951 Secondary malignant neoplasm of bone: Secondary | ICD-10-CM

## 2024-01-10 DIAGNOSIS — Z170 Estrogen receptor positive status [ER+]: Secondary | ICD-10-CM

## 2024-01-10 NOTE — Progress Notes (Signed)
 Radiation Oncology         [phone] ________________________________  Name: Paige Foster MRN: [id number]  Date: 01/10/2024  DOB: 10-Jun-1966  Follow-Up Visit Note  Outpatient  CC: Nche, Connye Delaine, NP  Paige Herbert Connye Delaine, NP  Diagnosis and Prior Radiotherapy:    ICD-10-CM   1. Malignant neoplasm of upper-outer quadrant of left breast in female, estrogen receptor positive (HCC)  C50.412    Z17.0     2. 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 3: s/p neoadjuvant chemotherapy, left breast lumpectomy, adjuvant radiation therapy, and anastrozole  x 5 years (due to complete anastrozole  this coming Nov 2025)    Initial diagnosis of left breast DCIS in 2004: s/p left mastectomy w/ TRAM reconstruction followed by Tamoxifen x3 years   ==========DELIVERED PLANS==========  First Treatment Date: 2023-11-22 Last Treatment Date: 2023-11-29   Plan Name: Spine_L Site: Lumbar Spine Technique: 3D Mode: Photon Dose Per Fraction: 4 Gy Prescribed Dose (Delivered / Prescribed): 20 Gy / 20 Gy Prescribed Fxs (Delivered / Prescribed): 5 / 5   Plan Name: Ext_R_Shld Site: Humerus, Right Technique: 3D Mode: Photon Dose Per Fraction: 4 Gy Prescribed Dose (Delivered / Prescribed): 20 Gy / 20 Gy Prescribed Fxs (Delivered / Prescribed): 5 / 5  CHIEF COMPLAINT: Here for follow-up and surveillance of metastatic breast cancer   Narrative:  The patient returns today for routine follow-up. She completed radiation to her L-spine and right humerus on 11/29/2023.   Patient reports to be doing well overall today.  She is no longer experiencing pain in her right shoulder, and is very pleased with her response to the radiation treatment.  She denies any lower back pain,  right hip pain, or pain anywhere else.  She notes residual hyperpigmentation at her right shoulder, and a good energy level.                             ALLERGIES:  is allergic to bee pollen.  Meds: Current Outpatient Medications  Medication Sig Dispense Refill   amLODipine  (NORVASC ) 10 MG tablet Take 1 tablet by mouth daily. 90 tablet 2   anastrozole  (ARIMIDEX ) 1 MG tablet TAKE 1 TABLET(1 MG) BY MOUTH DAILY 90 tablet 4   atorvastatin  (LIPITOR) 20 MG tablet Take 1 tablet (20 mg total) by mouth daily. 90 tablet 3   brimonidine  (ALPHAGAN ) 0.2 % ophthalmic solution Place 1 drop into both eyes 3 (three) times daily.     dexamethasone  (DECADRON ) 4 MG tablet Take 2 tabs by mouth 2 times daily starting day before chemo. Then take 2 tabs daily for 2 days starting day after chemo. Take with food. 30 tablet 1   DOCEtaxel  (TAXOTERE ) 20 MG/ML CONC Inject 121 mg into the vein once.     dorzolamide -timolol  (COSOPT ) 2-0.5 % ophthalmic solution 1 drop 2 (two) times daily.     lidocaine -prilocaine  (EMLA ) cream Apply to affected area 1 hour before access prn 30 g 3   losartan  (COZAAR ) 50 MG tablet TAKE 1 TABLET(50 MG) BY MOUTH DAILY 90 tablet 3   magnesium  oxide (MAG-OX) 400 (240 Mg) MG tablet Take 1 tablet (400 mg total) by mouth 2 (two) times daily. 10 tablet 0  ondansetron  (ZOFRAN ) 8 MG tablet Take 1 tablet (8 mg total) by mouth every 8 (eight) hours as needed for nausea or vomiting. 20 tablet 3   pegfilgrastim  (NEULASTA  ONPRO KIT) 6 MG/0.6ML injection Inject 6 mg into the skin once. Inject via provided programmed delivery device.     pertuzumab  (PERJETA ) 420 MG/14ML SOLN Inject 420 mg into the vein once.     prochlorperazine  (COMPAZINE ) 10 MG tablet Take 1 tablet (10 mg total) by mouth every 6 (six) hours as needed for nausea or vomiting. 30 tablet 1   trastuzumab -anns 336 mg in sodium chloride  0.9 % 250 mL Inject 336 mg into the vein once.     zoledronic  acid (ZOMETA ) 4 MG/5ML injection Inject 4 mg into  the vein once.     No current facility-administered medications for this encounter.    Physical Findings: The patient is in no acute distress. Patient is alert and oriented.  height is 5' 4 (1.626 m) and weight is 121 lb 3.2 oz (55 kg). Her oral temperature is 98.4 F (36.9 C). Her blood pressure is 104/60 and her pulse is 63. Her respiration is 18 and oxygen saturation is 100%. .    General: Alert and oriented, in no acute distress HEENT: Head is normocephalic. Extraocular movements are intact. Oropharynx is clear. Neck: Neck is supple, no palpable cervical or supraclavicular lymphadenopathy. Heart: Regular in rate and rhythm with no murmurs, rubs, or gallops. Chest: Clear to auscultation bilaterally, with no rhonchi, wheezes, or rales. Abdomen: Soft, nontender, nondistended, with no rigidity or guarding. Extremities: No cyanosis or edema. Lymphatics: see Neck Exam Skin: Hyperpigmentation within the treatment field. Musculoskeletal: symmetric strength and muscle tone throughout.  Normal gait.  No tenderness along the spine. Neurologic: Cranial nerves II through XII are grossly intact. No obvious focalities. Speech is fluent. Coordination is intact. Psychiatric: Judgment and insight are intact. Affect is appropriate.    Lab Findings: Lab Results  Component Value Date   WBC 4.5 12/28/2023   HGB 10.1 (L) 12/28/2023   HCT 29.5 (L) 12/28/2023   MCV 91.9 12/28/2023   PLT 348 12/28/2023    Radiographic Findings: CARDIAC CATHETERIZATION Result Date: 12/16/2023 Normal coronary anatomy Low LVEDP 6 mm Hg Plan: no cardiac cause for elevated troponin. Suspect related to dehydration and medical illness  ECHOCARDIOGRAM LIMITED Result Date: 12/16/2023    ECHOCARDIOGRAM LIMITED REPORT   Patient Name:   Paige Foster Date of Exam: 12/16/2023 Medical Rec #:  [id number]       Height:       64.0 in Accession #:    [id number]      Weight:       119.3 lb Date of Birth:  1966-06-30       BSA:           1.570 m Patient Age:    58 years        BP:           112/80 mmHg Patient Gender: F               HR:           78 bpm. Exam Location:  Inpatient Procedure: Limited Echo, Limited Color Doppler, Cardiac Doppler, Strain Analysis            and 3D Echo (Both Spectral and Color Flow Doppler were utilized            during procedure). Indications:    CHF  History:  Patient has prior history of Echocardiogram examinations, most                 recent 11/29/2023. Risk Factors:Hypertension and Dyslipidemia.                 NSTEMI.  Sonographer:    Juanita Shaw Referring Phys: [id number] SHENG L HALEY IMPRESSIONS  1. Left ventricular ejection fraction, by estimation, is 65 to 70%. The left ventricle has normal function. The left ventricle has no regional wall motion abnormalities. The average left ventricular global longitudinal strain is -18.6 %. The global longitudinal strain is normal.  2. Right ventricular systolic function is normal. The right ventricular size is normal. There is mildly elevated pulmonary artery systolic pressure.  3. Left atrial size was mildly dilated.  4. The mitral valve is normal in structure. Trivial mitral valve regurgitation. No evidence of mitral stenosis.  5. The aortic valve is tricuspid. Aortic valve regurgitation is not visualized. Aortic valve sclerosis is present, with no evidence of aortic valve stenosis.  6. The inferior vena cava is normal in size with greater than 50% respiratory variability, suggesting right atrial pressure of 3 mmHg. FINDINGS  Left Ventricle: Left ventricular ejection fraction, by estimation, is 65 to 70%. The left ventricle has normal function. The left ventricle has no regional wall motion abnormalities. The average left ventricular global longitudinal strain is -18.6 %. Strain was performed and the global longitudinal strain is normal. The left ventricular internal cavity size was normal in size. There is no left ventricular hypertrophy. Right Ventricle: The  right ventricular size is normal. Right ventricular systolic function is normal. There is mildly elevated pulmonary artery systolic pressure. The tricuspid regurgitant velocity is 3.06 m/s, and with an assumed right atrial pressure of 3 mmHg, the estimated right ventricular systolic pressure is 40.5 mmHg. Left Atrium: Left atrial size was mildly dilated. Right Atrium: Right atrial size was normal in size. Pericardium: There is no evidence of pericardial effusion. Mitral Valve: The mitral valve is normal in structure. Trivial mitral valve regurgitation. No evidence of mitral valve stenosis. Tricuspid Valve: The tricuspid valve is normal in structure. Tricuspid valve regurgitation is mild . No evidence of tricuspid stenosis. Aortic Valve: The aortic valve is tricuspid. Aortic valve regurgitation is not visualized. Aortic valve sclerosis is present, with no evidence of aortic valve stenosis. Pulmonic Valve: The pulmonic valve was normal in structure. Pulmonic valve regurgitation is not visualized. No evidence of pulmonic stenosis. Aorta: The aortic root is normal in size and structure. Venous: The inferior vena cava is normal in size with greater than 50% respiratory variability, suggesting right atrial pressure of 3 mmHg. IAS/Shunts: No atrial level shunt detected by color flow Doppler. Additional Comments: 3D was performed not requiring image post processing on an independent workstation and was normal.  LEFT VENTRICLE PLAX 2D LVIDd:         4.80 cm LVIDs:         2.80 cm      2D Longitudinal Strain LV PW:         0.80 cm      2D Strain GLS Avg:     -18.6 % LV IVS:        0.60 cm  LV Volumes (MOD) LV vol d, MOD A2C: 89.2 ml LV vol d, MOD A4C: 120.0 ml LV vol s, MOD A2C: 30.9 ml LV vol s, MOD A4C: 39.2 ml LV SV MOD A2C:     58.3 ml LV SV MOD  A4C:     120.0 ml LV SV MOD BP:      69.2 ml RIGHT VENTRICLE             IVC RV S prime:     14.60 cm/s  IVC diam: 1.20 cm TAPSE (M-mode): 3.4 cm TRICUSPID VALVE TR Peak grad:    37.5 mmHg TR Vmax:        306.00 cm/s Alexandria Angel MD Electronically signed by Alexandria Angel MD Signature Date/Time: 12/16/2023/10:03:45 AM    Final    CT Angio Chest Pulmonary Embolism (PE) W or WO Contrast Result Date: 12/15/2023 CLINICAL DATA:  Acute onset dizziness, weakness, and nausea. History of left-sided breast cancer status post left mastectomy with recurrent disease. * Tracking Code: BO * EXAM: CT ANGIOGRAPHY CHEST WITH CONTRAST TECHNIQUE: Multidetector CT imaging of the chest was performed using the standard protocol during bolus administration of intravenous contrast. Multiplanar CT image reconstructions and MIPs were obtained to evaluate the vascular anatomy. RADIATION DOSE REDUCTION: This exam was performed according to the departmental dose-optimization program which includes automated exposure control, adjustment of the mA and/or kV according to patient size and/or use of iterative reconstruction technique. CONTRAST:  75mL OMNIPAQUE  IOHEXOL  350 MG/ML SOLN COMPARISON:  Same day chest radiograph, CT chest dated 10/26/2023 FINDINGS: Cardiovascular: The study is high quality for the evaluation of pulmonary embolism. There are no filling defects in the central, lobar, segmental or subsegmental pulmonary artery branches to suggest acute pulmonary embolism. Great vessels are normal in course and caliber. Normal heart size. No significant pericardial fluid/thickening. Aortic atherosclerosis. Mediastinum/Nodes: Imaged thyroid  gland without nodules meeting criteria for imaging follow-up by size. Normal esophagus. No pathologically enlarged axillary, supraclavicular, mediastinal, or hilar lymph nodes. Lungs/Pleura: The central airways are patent. No focal consolidation. No pneumothorax. Small left pleural effusion. Upper abdomen: Innumerable hepatic metastases, better evaluated on prior CT abdomen and pelvis. Musculoskeletal: Postsurgical changes of the left breast. Peripherally enhancing clinic mass is  again seen, measuring 6.5 x 3.8 cm, previously 5.9 x 3.4 cm (remeasured), located within the left inferior anterolateral seventh rib. Multiple additional lytic rib lesions are increased in conspicuity involving the right lateral seventh and posterolateral ninth as well as left anterolateral fourth ribs. Increased conspicuity of multifocal right scapular lesions. Asymmetric sclerosis of the medial right clavicular head. Multifocal thoracic spinal metastases are better evaluated on prior MRI. Review of the MIP images confirms the above findings. IMPRESSION: 1. No evidence of pulmonary embolism. 2. Small left pleural effusion. 3. Increased size of left anterolateral rib lesion and conspicuity of additional multifocal osseous metastases. 4. Innumerable hepatic metastases, better evaluated on prior CT abdomen and pelvis. Aortic Atherosclerosis (ICD10-I70.0). Electronically Signed   By: Limin  Xu M.D.   On: 12/15/2023 15:48   DG Chest 2 View Result Date: 12/15/2023 CLINICAL DATA:  Shortness of breath. EXAM: CHEST - 2 VIEW COMPARISON:  10/17/2023 FINDINGS: Right chest wall port a catheter is noted with tip in the superior cavoatrial junction. Heart size and mediastinal contours are unremarkable. Mildly increased interstitial opacities identified throughout both lungs. No pleural fluid. Left lateral destructive rib lesion is again noted. Surgical clips identified in the left axilla. IMPRESSION: 1. Mildly increased interstitial opacities throughout both lungs. Differential considerations include mild interstitial edema versus atypical infection. 2. Left lateral destructive rib lesion. Electronically Signed   By: Kimberley Penman M.D.   On: 12/15/2023 07:33    Impression/Plan: Recent imaging with evidence of extensive hepatic, osseous metastatic disease from breast cancer primary;  s/p palliative radiation to the L-spine and right humerous  Patient has responded well to her radiation treatment and is fortunately no  longer experiencing right shoulder pain.  She remains asymptomatic from any other osseous disease.  Educated her on signs and symptoms of typical cancer related pain and encouraged her to follow-up if she were to experience any of them. The patient will continue on chemotherapy under the care of Dr. Arno Bibles.  She is scheduled to see her next on 01/18/2024.  Radiation follow-up as needed.  We appreciate the opportunity to take part in this patient's care.  She was encouraged to call with any questions or concerns.  On date of service, in total, I spent 20 minutes on this encounter. Patient was seen in person.  _____________________________________    Julio Ohm, PA-C

## 2024-01-10 NOTE — Progress Notes (Signed)
 Paige Foster is here today for follow up post radiation to the breast.   Breast Side: Left Side   They completed their radiation on: 11/29/23  Does the patient complain of any of the following: Post radiation skin issues: Yes, darkening under armpit. Breast Tenderness: Denies Breast Swelling: Denies Lymphadema: Denies Range of Motion limitations: Denies Fatigue post radiation: Denies Appetite good/fair/poor: Denies  Additional comments if applicable:  BP 104/60 (BP Location: Left Arm, Patient Position: Sitting)   Pulse 63   Temp 98.4 F (36.9 C) (Oral)   Resp 18   Ht 5' 4 (1.626 m)   Wt 121 lb 3.2 oz (55 kg)   LMP 06/08/2007 Comment: Just stopped having period  BMI 20.80 kg/m

## 2024-01-10 NOTE — Telephone Encounter (Signed)
 Patient called requesting a return to work note from Dr. Arno Bibles.  Returned patient's call to let her know that signed note was ready for her to pick up at the front desk of [facility]. Patient verbalized understanding and appreciation of call.

## 2024-01-12 NOTE — Addendum Note (Signed)
 Encounter addended by: Karolynn Pack on: 01/12/2024 12:12 PM  Actions taken: Imaging Exam ended

## 2024-01-13 ENCOUNTER — Encounter: Payer: Self-pay | Admitting: Hematology and Oncology

## 2024-01-16 ENCOUNTER — Inpatient Hospital Stay

## 2024-01-16 VITALS — BP 102/67 | HR 70 | Temp 98.0°F | Resp 20

## 2024-01-16 DIAGNOSIS — Z170 Estrogen receptor positive status [ER+]: Secondary | ICD-10-CM

## 2024-01-16 DIAGNOSIS — C50412 Malignant neoplasm of upper-outer quadrant of left female breast: Secondary | ICD-10-CM | POA: Diagnosis not present

## 2024-01-16 DIAGNOSIS — C7951 Secondary malignant neoplasm of bone: Secondary | ICD-10-CM | POA: Diagnosis not present

## 2024-01-16 DIAGNOSIS — Z853 Personal history of malignant neoplasm of breast: Secondary | ICD-10-CM | POA: Diagnosis not present

## 2024-01-16 DIAGNOSIS — C787 Secondary malignant neoplasm of liver and intrahepatic bile duct: Secondary | ICD-10-CM | POA: Diagnosis not present

## 2024-01-16 DIAGNOSIS — Z5111 Encounter for antineoplastic chemotherapy: Secondary | ICD-10-CM | POA: Diagnosis not present

## 2024-01-16 DIAGNOSIS — Z9013 Acquired absence of bilateral breasts and nipples: Secondary | ICD-10-CM | POA: Diagnosis not present

## 2024-01-16 DIAGNOSIS — E860 Dehydration: Secondary | ICD-10-CM | POA: Diagnosis not present

## 2024-01-16 DIAGNOSIS — Z79811 Long term (current) use of aromatase inhibitors: Secondary | ICD-10-CM | POA: Diagnosis not present

## 2024-01-16 DIAGNOSIS — Z79899 Other long term (current) drug therapy: Secondary | ICD-10-CM | POA: Diagnosis not present

## 2024-01-16 MED ORDER — HEPARIN SOD (PORK) LOCK FLUSH 100 UNIT/ML IV SOLN
250.0000 [IU] | Freq: Once | INTRAVENOUS | Status: AC | PRN
  Administered 2024-01-16: 250 [IU]

## 2024-01-16 MED ORDER — SODIUM CHLORIDE 0.9% FLUSH
3.0000 mL | Freq: Once | INTRAVENOUS | Status: AC | PRN
  Administered 2024-01-16: 3 mL

## 2024-01-16 MED ORDER — POTASSIUM CHLORIDE IN NACL 20-0.9 MEQ/L-% IV SOLN
Freq: Once | INTRAVENOUS | Status: AC
  Filled 2024-01-16: qty 1000

## 2024-01-16 NOTE — Patient Instructions (Signed)

## 2024-01-17 ENCOUNTER — Telehealth: Payer: Self-pay

## 2024-01-17 NOTE — Telephone Encounter (Signed)
 Verbally confirmed appt for 6/25

## 2024-01-18 ENCOUNTER — Inpatient Hospital Stay

## 2024-01-18 ENCOUNTER — Encounter: Payer: Self-pay | Admitting: Hematology and Oncology

## 2024-01-18 ENCOUNTER — Inpatient Hospital Stay (HOSPITAL_BASED_OUTPATIENT_CLINIC_OR_DEPARTMENT_OTHER): Admitting: Hematology and Oncology

## 2024-01-18 VITALS — BP 119/76 | HR 72 | Temp 98.1°F | Resp 17 | Wt 120.3 lb

## 2024-01-18 DIAGNOSIS — C50412 Malignant neoplasm of upper-outer quadrant of left female breast: Secondary | ICD-10-CM

## 2024-01-18 DIAGNOSIS — C7951 Secondary malignant neoplasm of bone: Secondary | ICD-10-CM

## 2024-01-18 DIAGNOSIS — E860 Dehydration: Secondary | ICD-10-CM | POA: Diagnosis not present

## 2024-01-18 DIAGNOSIS — C50912 Malignant neoplasm of unspecified site of left female breast: Secondary | ICD-10-CM

## 2024-01-18 DIAGNOSIS — Z79811 Long term (current) use of aromatase inhibitors: Secondary | ICD-10-CM | POA: Diagnosis not present

## 2024-01-18 DIAGNOSIS — Z170 Estrogen receptor positive status [ER+]: Secondary | ICD-10-CM

## 2024-01-18 DIAGNOSIS — Z853 Personal history of malignant neoplasm of breast: Secondary | ICD-10-CM | POA: Diagnosis not present

## 2024-01-18 DIAGNOSIS — Z79899 Other long term (current) drug therapy: Secondary | ICD-10-CM | POA: Diagnosis not present

## 2024-01-18 DIAGNOSIS — Z9013 Acquired absence of bilateral breasts and nipples: Secondary | ICD-10-CM | POA: Diagnosis not present

## 2024-01-18 DIAGNOSIS — C787 Secondary malignant neoplasm of liver and intrahepatic bile duct: Secondary | ICD-10-CM | POA: Diagnosis not present

## 2024-01-18 DIAGNOSIS — Z5111 Encounter for antineoplastic chemotherapy: Secondary | ICD-10-CM | POA: Diagnosis not present

## 2024-01-18 LAB — CMP (CANCER CENTER ONLY)
ALT: 17 U/L (ref 0–44)
AST: 21 U/L (ref 15–41)
Albumin: 3.8 g/dL (ref 3.5–5.0)
Alkaline Phosphatase: 131 U/L — ABNORMAL HIGH (ref 38–126)
Anion gap: 6 (ref 5–15)
BUN: 12 mg/dL (ref 6–20)
CO2: 25 mmol/L (ref 22–32)
Calcium: 8.9 mg/dL (ref 8.9–10.3)
Chloride: 105 mmol/L (ref 98–111)
Creatinine: 0.66 mg/dL (ref 0.44–1.00)
GFR, Estimated: >60 mL/min (ref >60)
Glucose, Bld: 103 mg/dL — ABNORMAL HIGH (ref 70–99)
Potassium: 3.4 mmol/L — ABNORMAL LOW (ref 3.5–5.1)
Sodium: 136 mmol/L (ref 135–145)
Total Bilirubin: 0.3 mg/dL (ref 0.0–1.2)
Total Protein: 6.5 g/dL (ref 6.5–8.1)

## 2024-01-18 LAB — CBC WITH DIFFERENTIAL (CANCER CENTER ONLY)
Abs Immature Granulocytes: 0.03 10*3/uL (ref 0.00–0.07)
Basophils Absolute: 0 10*3/uL (ref 0.0–0.1)
Basophils Relative: 1 %
Eosinophils Absolute: 0 10*3/uL (ref 0.0–0.5)
Eosinophils Relative: 0 %
HCT: 29.2 % — ABNORMAL LOW (ref 36.0–46.0)
Hemoglobin: 10.1 g/dL — ABNORMAL LOW (ref 12.0–15.0)
Immature Granulocytes: 1 %
Lymphocytes Relative: 10 %
Lymphs Abs: 0.6 10*3/uL — ABNORMAL LOW (ref 0.7–4.0)
MCH: 31.9 pg (ref 26.0–34.0)
MCHC: 34.6 g/dL (ref 30.0–36.0)
MCV: 92.1 fL (ref 80.0–100.0)
Monocytes Absolute: 0.8 10*3/uL (ref 0.1–1.0)
Monocytes Relative: 13 %
Neutro Abs: 4.4 10*3/uL (ref 1.7–7.7)
Neutrophils Relative %: 75 %
Platelet Count: 289 10*3/uL (ref 150–400)
RBC: 3.17 MIL/uL — ABNORMAL LOW (ref 3.87–5.11)
RDW: 16.6 % — ABNORMAL HIGH (ref 11.5–15.5)
WBC Count: 5.9 10*3/uL (ref 4.0–10.5)
nRBC: 0 % (ref 0.0–0.2)

## 2024-01-18 MED ORDER — DIPHENHYDRAMINE HCL 25 MG PO CAPS
50.0000 mg | ORAL_CAPSULE | Freq: Once | ORAL | Status: AC
  Administered 2024-01-18: 50 mg via ORAL
  Filled 2024-01-18: qty 2

## 2024-01-18 MED ORDER — DEXAMETHASONE SODIUM PHOSPHATE 10 MG/ML IJ SOLN
10.0000 mg | Freq: Once | INTRAMUSCULAR | Status: AC
  Administered 2024-01-18: 10 mg via INTRAVENOUS
  Filled 2024-01-18: qty 1

## 2024-01-18 MED ORDER — HEPARIN SOD (PORK) LOCK FLUSH 100 UNIT/ML IV SOLN
500.0000 [IU] | Freq: Once | INTRAVENOUS | Status: AC | PRN
  Administered 2024-01-18: 500 [IU]

## 2024-01-18 MED ORDER — ACETAMINOPHEN 325 MG PO TABS
650.0000 mg | ORAL_TABLET | Freq: Once | ORAL | Status: AC
  Administered 2024-01-18: 650 mg via ORAL
  Filled 2024-01-18: qty 2

## 2024-01-18 MED ORDER — SODIUM CHLORIDE 0.9 % IV SOLN
420.0000 mg | Freq: Once | INTRAVENOUS | Status: AC
  Administered 2024-01-18: 420 mg via INTRAVENOUS
  Filled 2024-01-18: qty 14

## 2024-01-18 MED ORDER — SODIUM CHLORIDE 0.9% FLUSH
10.0000 mL | Freq: Once | INTRAVENOUS | Status: AC | PRN
  Administered 2024-01-18: 10 mL

## 2024-01-18 MED ORDER — TRASTUZUMAB-ANNS CHEMO 150 MG IV SOLR
6.0000 mg/kg | Freq: Once | INTRAVENOUS | Status: AC
  Administered 2024-01-18: 336 mg via INTRAVENOUS
  Filled 2024-01-18: qty 16

## 2024-01-18 MED ORDER — SODIUM CHLORIDE 0.9% FLUSH
10.0000 mL | INTRAVENOUS | Status: DC | PRN
  Administered 2024-01-18: 10 mL

## 2024-01-18 MED ORDER — SODIUM CHLORIDE 0.9 % IV SOLN
60.0000 mg/m2 | Freq: Once | INTRAVENOUS | Status: AC
  Administered 2024-01-18: 97 mg via INTRAVENOUS
  Filled 2024-01-18: qty 9.7

## 2024-01-18 MED ORDER — SODIUM CHLORIDE 0.9 % IV SOLN: INTRAVENOUS | Status: DC

## 2024-01-18 NOTE — Progress Notes (Signed)
 [facility]  Telephone:([phone]) [phone] Fax:([phone]    ID: Paige Foster DOB: [birth year]  MR#: [id number]  RDW#:[id number]  Patient Care Team: Paige Paige Rockford, NP as PCP - General (Internal Medicine) Paige Lonni CROME, MD as PCP - Cardiology (Cardiology) Paige Cough, MD as Consulting Physician (General Surgery) Paige Foster, Paige Rockford, NP as Nurse Practitioner (Internal Medicine) Paige Baptist, MD as Consulting Physician (Ophthalmology) Paige Victory Foster MOULD, MD as Consulting Physician (Gastroenterology) Paige Foster [facility]) OTHER MD:   CHIEF COMPLAINT: 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  She tolerated the chemotherapy well without significant side effects such as nausea, vomiting, diarrhea, or chest pain. A previous incident of dehydration led to concerns about a non-ST elevation myocardial infarction, but this was resolved with adequate hydration.  No shortness of breath, changes in bowel habits, or trouble urinating. Her appetite has improved, although her weight remains stable at 120 pounds. She notes that her urine is darker yellow.  Rest of the pertinent 10 point ROS reviewed and neg.  HISTORY OF CURRENT ILLNESS: From the original intake note:  Paige Foster has a prior history of left breast cancer, dating back to 2004. At that time she underwent a left mastectomy for stage 0 (noninvasive) breast cancer, with transverse rectus abdominis (TRAM) flap construction under Dr. Marcus. She also underwent a right breast reduction. She took tamoxifen for three years.  More recently she underwent bilateral diagnostic mammography with tomography and left breast ultrasonography at [facility] on 01/03/2018 showing:  Breast Density Category B. There is an oval fat containing lesion in the left breast upper outer quadrant posterior depth. No other significant masses, calcifications, or other findings are seen in either breast. Sonographically, there is a 1.5 cm lesion in the left breast upper outer quadrant posterior depth. This lesion is of mixed echogenicity. This correlates as palpated and with mammography findings. Follow up was recommended.  Close follow-up was suggested.  She then presented with a non-tender mass in the left reconstructed breast on 08/30/2018. On physical exam, there is a hard palpable lump measuring 2.0 cm in the upper outer left reconstructed breast 10 cm from the expected location of a nipple. Sonography over this area demonstrates a 1.8 cm x 1.7 cm x 1.4 cm mass in the left breast at 2 o'clock posterior depth 10 cm from the nipple. This mass is of mixed echogenicity. This abnormality is increased in size and correlates as palpated and with prior mammography findings. Color flow imaging demonstrates that there is vascularity present. Elastography imaging assessment is intermediate. No significant abnormalities were seen sonographically in the left axilla.    Accordingly on 08/30/2018 she proceeded to biopsy of the left breast mass in question. The pathology from this procedure showed ([id number]): invasive ductal carcinoma, grade III. Prognostic indicators significant for: estrogen receptor, 100% positive with strong staining intensity and progesterone receptor, 0% negative. Proliferation marker Ki67 at 15%. HER2 positive (3+) by immunohistochemistry.  The patient's subsequent history is as detailed below.   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    Port-A-Cath in place 09/28/2018   Recurrent breast  cancer, left [facility]) oncologist-- dr layla    dx 2004, noninvasive Stage 0 ----s/p left mastectomy w/ tram flap construction (and right breast reduction), taken Tamoxifen for 3 yrs;   08-30-2018 recurrent left cancer , Grade III,  cT1c,  ER positive, PR negative, HER-2 positive, invasive ductal carcinoma-- neoadjuvant chemo to start 09-26-2018   Renal artery stenosis (HCC)    mild right external renal artery stenosis per duplex in epic 08-09-2013   Wears glasses     PAST SURGICAL HISTORY: Past Surgical History:  Procedure Laterality Date   BREAST LUMPECTOMY WITH RADIOACTIVE SEED LOCALIZATION Left 02/20/2019   Procedure: LEFT BREAST LUMPECTOMY WITH RADIOACTIVE SEED LOCALIZATION;  Surgeon: Paige Cough, MD;  Location: [facility] OR;  Service: General;  Laterality: Left;   BREAST SURGERY Left    Transflap   COLONOSCOPY     COLONOSCOPY     IR IMAGING GUIDED PORT INSERTION  11/25/2023   LEFT HEART CATH AND CORONARY ANGIOGRAPHY N/A 12/16/2023   Procedure: LEFT HEART CATH AND CORONARY ANGIOGRAPHY;  Surgeon: Swaziland, Peter M, MD;  Location: [facility] INVASIVE CV LAB;  Service: Cardiovascular;  Laterality: N/A;   MASTECTOMY Left 2004   w/  TRAM flap construction and right breast augmentation with abdominoplasy   PARS PLANA VITRECTOMY Left 12/18/2018   Procedure: PARS PLANA VITRECTOMY WITH 25 GAUGE, ENDOLASER;  Surgeon: Paige Baptist, MD;  Location: [facility] OR;  Service: Ophthalmology;  Laterality: Left;   PORTACATH PLACEMENT N/A 09/25/2018   Procedure: INSERTION PORT-A-CATH WITH ULTRASOUND;  Surgeon: Paige Cough, MD;  Location: [facility] ORS;  Service: General;  Laterality: N/A;   TUBAL LIGATION Bilateral yrs ago    FAMILY HISTORY: Family History  Problem Relation Age of Onset   Diabetes Mother    Hypertension Mother    Kidney disease Father    Colon cancer Neg Hx    Colon polyps Neg Hx    Gallbladder disease Neg Hx    Heart disease Neg Hx    Esophageal cancer Neg Hx    Stomach cancer Neg Hx    Rectal  cancer Neg Hx   Paige Foster's father died from unknown causes in his early 36's. Patients' mother died from diabetes complications at age 107. The patient has 1 sister. Patient denies anyone in her family having breast, ovarian, prostate, or pancreatic cancer.    GYNECOLOGIC HISTORY:  Patient's last menstrual period was 06/08/2007. 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 Her husband, Zachary, works at [employer]. Shauniece has two children, Fonda and Alm. Fonda lives with her, is 74, and it attending [employer] for a computer based degree. Alm lives with her, is 97, and recently graduated from [employer] with a degree in Pension scheme manager.  He works for [employer]. Albirta has no grandchildren. She attends the [employer].   ADVANCED DIRECTIVES: In the absence of any documents to the contrary her husband, Zachary, is automatically her healthcare power of attorney     HEALTH MAINTENANCE: Social History   Tobacco Use   Smoking status: Never    Passive exposure: Never   Smokeless tobacco: Never  Vaping Use   Vaping status: Never Used  Substance Use Topics   Alcohol use: Not Currently    Alcohol/week: 0.0  standard drinks of alcohol    Comment: Occassionally   Drug use: No    Colonoscopy: April 2022, danis  PAP: January 2022, Paige Foster  Bone density:  2017; -1.6, osteopenic   Allergies  Allergen Reactions   Bee Pollen Itching    Watery eyes and nose running    Current Outpatient Medications  Medication Sig Dispense Refill   amLODipine  (NORVASC ) 10 MG tablet Take 1 tablet by mouth daily. 90 tablet 2   anastrozole  (ARIMIDEX ) 1 MG tablet TAKE 1 TABLET(1 MG) BY MOUTH DAILY 90 tablet 4   atorvastatin  (LIPITOR) 20 MG tablet Take 1 tablet (20 mg total) by mouth daily. 90 tablet 3   brimonidine  (ALPHAGAN ) 0.2 %  ophthalmic solution Place 1 drop into both eyes 3 (three) times daily.     dexamethasone  (DECADRON ) 4 MG tablet Take 2 tabs by mouth 2 times daily starting day before chemo. Then take 2 tabs daily for 2 days starting day after chemo. Take with food. 30 tablet 1   DOCEtaxel  (TAXOTERE ) 20 MG/ML CONC Inject 121 mg into the vein once.     dorzolamide -timolol  (COSOPT ) 2-0.5 % ophthalmic solution 1 drop 2 (two) times daily.     lidocaine -prilocaine  (EMLA ) cream Apply to affected area 1 hour before access prn 30 g 3   losartan  (COZAAR ) 50 MG tablet TAKE 1 TABLET(50 MG) BY MOUTH DAILY 90 tablet 3   magnesium  oxide (MAG-OX) 400 (240 Mg) MG tablet Take 1 tablet (400 mg total) by mouth 2 (two) times daily. 10 tablet 0   ondansetron  (ZOFRAN ) 8 MG tablet Take 1 tablet (8 mg total) by mouth every 8 (eight) hours as needed for nausea or vomiting. 20 tablet 3   pegfilgrastim  (NEULASTA  ONPRO KIT) 6 MG/0.6ML injection Inject 6 mg into the skin once. Inject via provided programmed delivery device.     pertuzumab  (PERJETA ) 420 MG/14ML SOLN Inject 420 mg into the vein once.     prochlorperazine  (COMPAZINE ) 10 MG tablet Take 1 tablet (10 mg total) by mouth every 6 (six) hours as needed for nausea or vomiting. 30 tablet 1   trastuzumab -anns 336 mg in sodium chloride  0.9 % 250 mL Inject 336 mg into the vein once.     zoledronic  acid (ZOMETA ) 4 MG/5ML injection Inject 4 mg into the vein once.     No current facility-administered medications for this visit.     OBJECTIVE: African-American woman who appears stated age  Vitals:   01/18/24 0816  BP: 119/76  Pulse: 72  Resp: 17  Temp: 98.1 F (36.7 C)  SpO2: 99%       Body mass index is 20.65 kg/m.   Wt Readings from Last 3 Encounters:  01/18/24 120 lb 4.8 oz (54.6 kg)  01/10/24 121 lb 3.2 oz (55 kg)  12/28/23 120 lb 3.2 oz (54.5 kg)   Physical Exam Constitutional:      Appearance: Normal appearance.   Cardiovascular:     Rate and Rhythm: Normal rate and  regular rhythm.     Pulses: Normal pulses.     Heart sounds: Normal heart sounds.  Pulmonary:     Effort: Pulmonary effort is normal.     Breath sounds: Normal breath sounds.   Musculoskeletal:        General: No swelling.     Cervical back: Normal range of motion and neck supple. No rigidity.  Lymphadenopathy:     Cervical: No cervical adenopathy.   Skin:    General: Skin is warm and  dry.   Neurological:     General: No focal deficit present.     Mental Status: She is alert.       LAB RESULTS:  CMP     Component Value Date/Time   NA 136 01/05/2024 0736   NA 140 10/12/2017 0950   K 3.8 01/05/2024 0736   CL 105 01/05/2024 0736   CO2 25 01/05/2024 0736   GLUCOSE 99 01/05/2024 0736   BUN 11 01/05/2024 0736   BUN 10 10/12/2017 0950   CREATININE 0.74 01/05/2024 0736   CALCIUM  8.5 (L) 01/05/2024 0736   PROT 6.7 12/28/2023 1125   PROT 7.5 10/12/2017 0950   ALBUMIN 3.8 12/28/2023 1125   ALBUMIN 4.3 10/12/2017 0950   AST 23 12/28/2023 1125   ALT 23 12/28/2023 1125   ALKPHOS 142 (H) 12/28/2023 1125   BILITOT 0.3 12/28/2023 1125   GFRNONAA >60 01/05/2024 0736   GFRAA >60 12/06/2019 1441   GFRAA >60 09/07/2018 1455   Lab Results  Component Value Date   WBC 5.9 01/18/2024   NEUTROABS 4.4 01/18/2024   HGB 10.1 (L) 01/18/2024   HCT 29.2 (L) 01/18/2024   MCV 92.1 01/18/2024   PLT 289 01/18/2024    Lab Results  Component Value Date   LABCA2 <4 08/30/2007    No components found for: [id number]  No results for input(s): INR in the last 168 hours.   Lab Results  Component Value Date   LABCA2 <4 08/30/2007    No results found for: CAN199  No results found for: CAN125  Lab Results  Component Value Date   CAN153 23.9 10/27/2023    Lab Results  Component Value Date   CA2729 27.9 10/27/2023    No components found for: HGQUANT  No results found for: CEA1, CEA / No results found for: CEA1, CEA  No results found for: AFPTUMOR  No  results found for: CHROMOGRNA  No results found for: TOTALPROTELP, ALBUMINELP, A1GS, A2GS, BETS, BETA2SER, GAMS, MSPIKE, SPEI (this displays SPEP labs)  No results found for: KPAFRELGTCHN, LAMBDASER, KAPLAMBRATIO (kappa/lambda light chains)  No results found for: HGBA, HGBA2QUANT, HGBFQUANT, HGBSQUAN (Hemoglobinopathy evaluation)   Lab Results  Component Value Date   LDH 169 08/30/2007    Lab Results  Component Value Date   IRON 96 07/11/2023   TIBC 382 07/11/2023   IRONPCTSAT 25 07/11/2023   (Iron and TIBC)  Lab Results  Component Value Date   FERRITIN 261 07/11/2023    Urinalysis    Component Value Date/Time   COLORURINE STRAW (A) 12/15/2023 0809   APPEARANCEUR CLEAR 12/15/2023 0809   LABSPEC 1.010 12/15/2023 0809   PHURINE 5.0 12/15/2023 0809   GLUCOSEU NEGATIVE 12/15/2023 0809   HGBUR NEGATIVE 12/15/2023 0809   HGBUR large 04/09/2008 1548   BILIRUBINUR NEGATIVE 12/15/2023 0809   KETONESUR NEGATIVE 12/15/2023 0809   PROTEINUR NEGATIVE 12/15/2023 0809   UROBILINOGEN 0.2 04/09/2008 1548   NITRITE NEGATIVE 12/15/2023 0809   LEUKOCYTESUR NEGATIVE 12/15/2023 0809    STUDIES:  No results found.    ELIGIBLE FOR AVAILABLE RESEARCH PROTOCOL: No  ASSESSMENT: 58 y.o. [place], [place] woman  (1) history of left-sided ductal carcinoma in situ 2004  (a) s/p left mastectomy with TRAM reconstruction  (b) status post tamoxifen x3 years  (2) left breast upper outer quadrant biopsy 08/30/2018 shows a clinical T1c N0 invasive ductal carcinoma, grade 3, estrogen receptor strongly positive, progesterone receptor negative, with HER-2 amplification, and and MIB-1 of 15%.   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    (b) chemotherapy discontinued after 4 cycles because of intercurrent eye surgery  (c) continued trastuzumab  and pertuzumab  to complete a year (last dose 09/06/2019)  (d) echocardiogram on 01/23/2019 that shows well preserved EF of 60-65%  (e) echocardiogram on 04/23/2019 shows EF of 60-65%  (f) echocardiogram 08/02/2019 shows an ejection fraction in the 60-65% range  (4) left lumpectomy 02/20/2019 showed a residual  ypT1c NX invasive ductal carcinoma, grade 2 with negative margins.    (5) adjuvant radiation: Radiation Treatment Dates: 04/12/2019 through 05/28/2019 Site Technique Total Dose (Gy) Dose per Fx (Gy) Completed Fx Beam Energies  Breast: CW_Lt 3D 50.4/50.4 1.8 28/28 6X, 10X  Breast: CW_Lt_SCV_PAB 3D 50.4/50.4 1.8 28/28 6X, 10X  Breast: CW_Lt_Bst Electron 10/10 2 5/5 6X, 10X   (6) anastrozole  started 06/26/2019  (a) DEXA scan at [facility] 12/24/2015 found a T score of -1.6 DEXA scan from mass June 2023 showed T score of -1.4 at L1-L2  (7) genetics testing 02/03/2019 through the Common Hereditary Cancers Panel offered by [facility] found no deleterious mutations in APC, ATM, AXIN2, BARD1, BMPR1A, BRCA1, BRCA2, BRIP1, CDH1, CDKN2A (p14ARF), CDKN2A (p16INK4a), CKD4, CHEK2, CTNNA1, DICER1, EPCAM (Deletion/duplication testing only), GREM1 (promoter region deletion/duplication testing only), KIT, MEN1, MLH1, MSH2, MSH3, MSH6, MUTYH, NBN, NF1, NHTL1, PALB2, PDGFRA, PMS2, POLD1, POLE, PTEN, RAD50, RAD51C, RAD51D, RNF43, SDHB, SDHC, SDHD, SMAD4, SMARCA4. STK11, TP53, TSC1, TSC2, and VHL.  The following genes were evaluated for sequence changes only: SDHA and HOXB13 c.251G>A variant only.  (8) She had a CXR for unintentional weight loss,  this showed possible left lung pleural based mass and posterior right eighth rib met lesion. CT showed 3.8 x 5.5 x 5 cm left anterior chest wall mass along the mid  axillary line with rib involvement and destruction and low-attenuation changes of the center indicating necrotic mass correlates with a neoplastic lesion likely metastatic disease. Liver is enlarged, inhomogeneous with multiple enhancing lesions throughout the right and left lobe of the liver consistent with extensive numerous metastatic liver lesions. Liver appears completely replaced by metastatic disease. Lytic metastatic bone lesions involving the right anterior superior iliac crest, left iliac bone, right inferior pubic ramus, left iliac bone, and L4 vertebral body. Suggestive of lytic bone changes involving the upper thoracic vertebral bodies T1-T2.  PLAN:  Assessment & Plan Metastatic Breast Cancer  HER2 positive and estrogen receptor positive breast cancer confirmed. Standard treatment involves chemotherapy and antibodies, docetaxel  with HP.  Assessment and Plan Assessment & Plan  Undergoing chemotherapy with good tolerance and stable weight. Blood work and kidney function satisfactory. Plan to assess tumor response post-third chemotherapy round. - Order CT of chest, abdomen, and pelvis post-third chemotherapy round to evaluate tumor response. - Ensure adequate hydration with fluids post-chemotherapy. - Encourage increased water  intake to maintain urine color as straw-colored or pale yellow.  Dehydration Kidney function stable.  - Administer fluids post-chemotherapy to ensure adequate hydration. - Encourage increased water  intake to maintain proper hydration levels.    Time spent:20 min   Amber Stalls, MD  Medical Oncology and Hematology [facility] [address] [place], [place] [address] Tel. [phone]    Fax. [phone]  *Total Encounter Time as  defined by the Centers for Medicare and Medicaid Services includes, in addition to the face-to-face time of a patient visit (documented in the note above) non-face-to-face time: obtaining and reviewing  outside history, ordering and reviewing medications, tests or procedures, care coordination (communications with other health care professionals or caregivers) and documentation in the medical record.

## 2024-01-18 NOTE — Patient Instructions (Signed)
 [facility] - A DEPT OF [facility]  Discharge Instructions: Thank you for choosing [facility] to provide your oncology and hematology care.   If you have a lab appointment with the Cancer Center, please go directly to the Cancer Center and check in at the registration area.   Wear comfortable clothing and clothing appropriate for easy access to any Portacath or PICC line.   We strive to give you quality time with your provider. You may need to reschedule your appointment if you arrive late (15 or more minutes).  Arriving late affects you and other patients whose appointments are after yours.  Also, if you miss three or more appointments without notifying the office, you may be dismissed from the clinic at the provider's discretion.      For prescription refill requests, have your pharmacy contact our office and allow 72 hours for refills to be completed.    Today you received the following chemotherapy and/or immunotherapy agents: Trastuzumab , pertuzumab , and docetaxel       To help prevent nausea and vomiting after your treatment, we encourage you to take your nausea medication as directed.  BELOW ARE SYMPTOMS THAT SHOULD BE REPORTED IMMEDIATELY: *FEVER GREATER THAN 100.4 F (38 C) OR HIGHER *CHILLS OR SWEATING *NAUSEA AND VOMITING THAT IS NOT CONTROLLED WITH YOUR NAUSEA MEDICATION *UNUSUAL SHORTNESS OF BREATH *UNUSUAL BRUISING OR BLEEDING *URINARY PROBLEMS (pain or burning when urinating, or frequent urination) *BOWEL PROBLEMS (unusual diarrhea, constipation, pain near the anus) TENDERNESS IN MOUTH AND THROAT WITH OR WITHOUT PRESENCE OF ULCERS (sore throat, sores in mouth, or a toothache) UNUSUAL RASH, SWELLING OR PAIN  UNUSUAL VAGINAL DISCHARGE OR ITCHING   Items with * indicate a potential emergency and should be followed up as soon as possible or go to the Emergency Department if any problems should occur.  Please show the CHEMOTHERAPY  ALERT CARD or IMMUNOTHERAPY ALERT CARD at check-in to the Emergency Department and triage nurse.  Should you have questions after your visit or need to cancel or reschedule your appointment, please contact [facility] - A DEPT OF [facility]  Dept: [phone]  and follow the prompts.  Office hours are 8:00 a.m. to 4:30 p.m. Monday - Friday. Please note that voicemails left after 4:00 p.m. may not be returned until the following business day.  We are closed weekends and major holidays. You have access to a nurse at all times for urgent questions. Please call the main number to the clinic Dept: [phone] and follow the prompts.   For any non-urgent questions, you may also contact your provider using MyChart. We now offer e-Visits for anyone 66 and older to request care online for non-urgent symptoms. For details visit [email or web address].   Also download the MyChart app! Go to the app store, search "MyChart", open the app, select [facility], and log in with your MyChart username and password.

## 2024-01-20 ENCOUNTER — Inpatient Hospital Stay

## 2024-01-20 ENCOUNTER — Telehealth: Payer: Self-pay | Admitting: *Deleted

## 2024-01-20 VITALS — BP 105/67 | HR 65 | Resp 18

## 2024-01-20 DIAGNOSIS — C50912 Malignant neoplasm of unspecified site of left female breast: Secondary | ICD-10-CM

## 2024-01-20 DIAGNOSIS — Z5111 Encounter for antineoplastic chemotherapy: Secondary | ICD-10-CM | POA: Diagnosis not present

## 2024-01-20 DIAGNOSIS — C7951 Secondary malignant neoplasm of bone: Secondary | ICD-10-CM | POA: Diagnosis not present

## 2024-01-20 DIAGNOSIS — Z79811 Long term (current) use of aromatase inhibitors: Secondary | ICD-10-CM | POA: Diagnosis not present

## 2024-01-20 DIAGNOSIS — Z79899 Other long term (current) drug therapy: Secondary | ICD-10-CM | POA: Diagnosis not present

## 2024-01-20 DIAGNOSIS — Z170 Estrogen receptor positive status [ER+]: Secondary | ICD-10-CM | POA: Diagnosis not present

## 2024-01-20 DIAGNOSIS — Z853 Personal history of malignant neoplasm of breast: Secondary | ICD-10-CM | POA: Diagnosis not present

## 2024-01-20 DIAGNOSIS — E860 Dehydration: Secondary | ICD-10-CM | POA: Diagnosis not present

## 2024-01-20 DIAGNOSIS — C787 Secondary malignant neoplasm of liver and intrahepatic bile duct: Secondary | ICD-10-CM | POA: Diagnosis not present

## 2024-01-20 DIAGNOSIS — Z9013 Acquired absence of bilateral breasts and nipples: Secondary | ICD-10-CM | POA: Diagnosis not present

## 2024-01-20 DIAGNOSIS — C50412 Malignant neoplasm of upper-outer quadrant of left female breast: Secondary | ICD-10-CM | POA: Diagnosis not present

## 2024-01-20 MED ORDER — PEGFILGRASTIM-CBQV 6 MG/0.6ML ~~LOC~~ SOSY
6.0000 mg | PREFILLED_SYRINGE | Freq: Once | SUBCUTANEOUS | Status: AC
  Administered 2024-01-20: 6 mg via SUBCUTANEOUS
  Filled 2024-01-20: qty 0.6

## 2024-01-20 NOTE — Telephone Encounter (Signed)
 Noted that pt did not receive IVF today with injection.  Called pt and she states she feels she is drinking very well and did not think at this time ( today ) that she needed IVF.  This RN reviewed above and possible need next week for IVF- pt will call this RN on Monday to follow up on how she is doing and maintaining her hydration.

## 2024-01-26 ENCOUNTER — Inpatient Hospital Stay

## 2024-01-26 ENCOUNTER — Inpatient Hospital Stay: Attending: Hematology and Oncology

## 2024-01-26 VITALS — BP 106/68 | HR 75 | Temp 98.7°F | Resp 16 | Wt 120.4 lb

## 2024-01-26 DIAGNOSIS — C50412 Malignant neoplasm of upper-outer quadrant of left female breast: Secondary | ICD-10-CM | POA: Insufficient documentation

## 2024-01-26 DIAGNOSIS — C7951 Secondary malignant neoplasm of bone: Secondary | ICD-10-CM | POA: Diagnosis not present

## 2024-01-26 DIAGNOSIS — Z170 Estrogen receptor positive status [ER+]: Secondary | ICD-10-CM | POA: Insufficient documentation

## 2024-01-26 DIAGNOSIS — Z9013 Acquired absence of bilateral breasts and nipples: Secondary | ICD-10-CM | POA: Diagnosis not present

## 2024-01-26 DIAGNOSIS — F419 Anxiety disorder, unspecified: Secondary | ICD-10-CM | POA: Diagnosis not present

## 2024-01-26 DIAGNOSIS — Z79811 Long term (current) use of aromatase inhibitors: Secondary | ICD-10-CM | POA: Diagnosis not present

## 2024-01-26 DIAGNOSIS — Z79899 Other long term (current) drug therapy: Secondary | ICD-10-CM | POA: Diagnosis not present

## 2024-01-26 DIAGNOSIS — Z5111 Encounter for antineoplastic chemotherapy: Secondary | ICD-10-CM | POA: Diagnosis not present

## 2024-01-26 MED ORDER — SODIUM CHLORIDE 0.9 % IV SOLN: INTRAVENOUS | Status: DC

## 2024-01-26 MED ORDER — HEPARIN SOD (PORK) LOCK FLUSH 100 UNIT/ML IV SOLN
500.0000 [IU] | Freq: Once | INTRAVENOUS | Status: AC | PRN
  Administered 2024-01-26: 500 [IU]

## 2024-01-26 MED ORDER — SODIUM CHLORIDE 0.9% FLUSH
10.0000 mL | Freq: Once | INTRAVENOUS | Status: AC | PRN
  Administered 2024-01-26: 10 mL

## 2024-01-26 NOTE — Progress Notes (Signed)
 Per Mallie ORN., PA-C, ok to increase rate of fluids to 999 ml/hr.

## 2024-01-31 ENCOUNTER — Encounter: Payer: Self-pay | Admitting: *Deleted

## 2024-02-01 ENCOUNTER — Ambulatory Visit (HOSPITAL_COMMUNITY)
Admission: RE | Admit: 2024-02-01 | Discharge: 2024-02-01 | Disposition: A | Discharge status: Home / Self Care | Source: Ambulatory Visit | Attending: Hematology and Oncology | Admitting: Hematology and Oncology

## 2024-02-01 DIAGNOSIS — C7951 Secondary malignant neoplasm of bone: Secondary | ICD-10-CM | POA: Insufficient documentation

## 2024-02-01 DIAGNOSIS — Z170 Estrogen receptor positive status [ER+]: Secondary | ICD-10-CM | POA: Diagnosis not present

## 2024-02-01 DIAGNOSIS — C50412 Malignant neoplasm of upper-outer quadrant of left female breast: Secondary | ICD-10-CM | POA: Insufficient documentation

## 2024-02-01 MED ORDER — IOHEXOL 300 MG/ML  SOLN
100.0000 mL | Freq: Once | INTRAMUSCULAR | Status: AC | PRN
  Administered 2024-02-01: 100 mL via INTRAVENOUS

## 2024-02-01 MED ORDER — HEPARIN SOD (PORK) LOCK FLUSH 100 UNIT/ML IV SOLN
INTRAVENOUS | Status: AC
  Filled 2024-02-01: qty 5

## 2024-02-01 MED ORDER — HEPARIN SOD (PORK) LOCK FLUSH 100 UNIT/ML IV SOLN
500.0000 [IU] | Freq: Once | INTRAVENOUS | Status: AC
  Administered 2024-02-01: 500 [IU] via INTRAVENOUS

## 2024-02-13 ENCOUNTER — Inpatient Hospital Stay

## 2024-02-13 ENCOUNTER — Inpatient Hospital Stay (HOSPITAL_BASED_OUTPATIENT_CLINIC_OR_DEPARTMENT_OTHER): Admitting: Hematology and Oncology

## 2024-02-13 VITALS — BP 126/76 | HR 71 | Temp 98.2°F | Resp 18 | Wt 118.3 lb

## 2024-02-13 VITALS — BP 157/97 | HR 91 | Resp 16

## 2024-02-13 DIAGNOSIS — C50912 Malignant neoplasm of unspecified site of left female breast: Secondary | ICD-10-CM

## 2024-02-13 DIAGNOSIS — C7951 Secondary malignant neoplasm of bone: Secondary | ICD-10-CM

## 2024-02-13 DIAGNOSIS — R4589 Other symptoms and signs involving emotional state: Secondary | ICD-10-CM | POA: Diagnosis not present

## 2024-02-13 DIAGNOSIS — Z170 Estrogen receptor positive status [ER+]: Secondary | ICD-10-CM | POA: Diagnosis not present

## 2024-02-13 DIAGNOSIS — Z5111 Encounter for antineoplastic chemotherapy: Secondary | ICD-10-CM | POA: Diagnosis not present

## 2024-02-13 DIAGNOSIS — C50412 Malignant neoplasm of upper-outer quadrant of left female breast: Secondary | ICD-10-CM | POA: Diagnosis not present

## 2024-02-13 DIAGNOSIS — Z79811 Long term (current) use of aromatase inhibitors: Secondary | ICD-10-CM | POA: Diagnosis not present

## 2024-02-13 DIAGNOSIS — F419 Anxiety disorder, unspecified: Secondary | ICD-10-CM | POA: Diagnosis not present

## 2024-02-13 DIAGNOSIS — Z9013 Acquired absence of bilateral breasts and nipples: Secondary | ICD-10-CM | POA: Diagnosis not present

## 2024-02-13 DIAGNOSIS — Z79899 Other long term (current) drug therapy: Secondary | ICD-10-CM | POA: Diagnosis not present

## 2024-02-13 LAB — CMP (CANCER CENTER ONLY)
ALT: 25 U/L (ref 0–44)
AST: 25 U/L (ref 15–41)
Albumin: 4 g/dL (ref 3.5–5.0)
Alkaline Phosphatase: 136 U/L — ABNORMAL HIGH (ref 38–126)
Anion gap: 5 (ref 5–15)
BUN: 10 mg/dL (ref 6–20)
CO2: 26 mmol/L (ref 22–32)
Calcium: 9.1 mg/dL (ref 8.9–10.3)
Chloride: 105 mmol/L (ref 98–111)
Creatinine: 0.61 mg/dL (ref 0.44–1.00)
GFR, Estimated: >60 mL/min (ref >60)
Glucose, Bld: 96 mg/dL (ref 70–99)
Potassium: 4 mmol/L (ref 3.5–5.1)
Sodium: 136 mmol/L (ref 135–145)
Total Bilirubin: 0.3 mg/dL (ref 0.0–1.2)
Total Protein: 6.9 g/dL (ref 6.5–8.1)

## 2024-02-13 LAB — CBC WITH DIFFERENTIAL (CANCER CENTER ONLY)
Abs Immature Granulocytes: 0.01 K/uL (ref 0.00–0.07)
Basophils Absolute: 0 K/uL (ref 0.0–0.1)
Basophils Relative: 1 %
Eosinophils Absolute: 0 K/uL (ref 0.0–0.5)
Eosinophils Relative: 0 %
HCT: 30.3 % — ABNORMAL LOW (ref 36.0–46.0)
Hemoglobin: 10.5 g/dL — ABNORMAL LOW (ref 12.0–15.0)
Immature Granulocytes: 0 %
Lymphocytes Relative: 15 %
Lymphs Abs: 0.7 K/uL (ref 0.7–4.0)
MCH: 32.8 pg (ref 26.0–34.0)
MCHC: 34.7 g/dL (ref 30.0–36.0)
MCV: 94.7 fL (ref 80.0–100.0)
Monocytes Absolute: 0.7 K/uL (ref 0.1–1.0)
Monocytes Relative: 15 %
Neutro Abs: 3.2 K/uL (ref 1.7–7.7)
Neutrophils Relative %: 69 %
Platelet Count: 254 K/uL (ref 150–400)
RBC: 3.2 MIL/uL — ABNORMAL LOW (ref 3.87–5.11)
RDW: 16.5 % — ABNORMAL HIGH (ref 11.5–15.5)
WBC Count: 4.7 K/uL (ref 4.0–10.5)
nRBC: 0 % (ref 0.0–0.2)

## 2024-02-13 MED ORDER — SODIUM CHLORIDE 0.9 % IV SOLN
60.0000 mg/m2 | Freq: Once | INTRAVENOUS | Status: AC
  Administered 2024-02-13: 97 mg via INTRAVENOUS
  Filled 2024-02-13: qty 9.7

## 2024-02-13 MED ORDER — SODIUM CHLORIDE 0.9 % IV SOLN
420.0000 mg | Freq: Once | INTRAVENOUS | Status: AC
  Administered 2024-02-13: 420 mg via INTRAVENOUS
  Filled 2024-02-13: qty 14

## 2024-02-13 MED ORDER — ALPRAZOLAM 0.5 MG PO TABS: 0.5000 mg | ORAL_TABLET | Freq: Every evening | ORAL | 0 refills | Status: DC | PRN

## 2024-02-13 MED ORDER — TRASTUZUMAB-ANNS CHEMO 150 MG IV SOLR
6.0000 mg/kg | Freq: Once | INTRAVENOUS | Status: AC
  Administered 2024-02-13: 336 mg via INTRAVENOUS
  Filled 2024-02-13: qty 16

## 2024-02-13 MED ORDER — ACETAMINOPHEN 325 MG PO TABS
650.0000 mg | ORAL_TABLET | Freq: Once | ORAL | Status: AC
  Administered 2024-02-13: 650 mg via ORAL
  Filled 2024-02-13: qty 2

## 2024-02-13 MED ORDER — HEPARIN SOD (PORK) LOCK FLUSH 100 UNIT/ML IV SOLN
500.0000 [IU] | Freq: Once | INTRAVENOUS | Status: AC | PRN
Start: 2024-02-13 — End: 2024-02-13
  Administered 2024-02-13: 500 [IU]

## 2024-02-13 MED ORDER — DIPHENHYDRAMINE HCL 25 MG PO CAPS
50.0000 mg | ORAL_CAPSULE | Freq: Once | ORAL | Status: AC
  Administered 2024-02-13: 50 mg via ORAL
  Filled 2024-02-13: qty 2

## 2024-02-13 MED ORDER — SODIUM CHLORIDE 0.9% FLUSH
10.0000 mL | Freq: Once | INTRAVENOUS | Status: AC
Start: 2024-02-13 — End: 2024-02-13
  Administered 2024-02-13: 10 mL

## 2024-02-13 MED ORDER — SODIUM CHLORIDE 0.9% FLUSH
10.0000 mL | INTRAVENOUS | Status: DC | PRN
  Administered 2024-02-13: 10 mL

## 2024-02-13 MED ORDER — DEXAMETHASONE SODIUM PHOSPHATE 10 MG/ML IJ SOLN
10.0000 mg | Freq: Once | INTRAMUSCULAR | Status: AC
  Administered 2024-02-13: 10 mg via INTRAVENOUS
  Filled 2024-02-13: qty 1

## 2024-02-13 MED ORDER — SODIUM CHLORIDE 0.9 % IV SOLN: INTRAVENOUS | Status: DC

## 2024-02-13 NOTE — Patient Instructions (Signed)
 [facility] - A DEPT OF [facility]  Discharge Instructions: Thank you for choosing [facility] to provide your oncology and hematology care.   If you have a lab appointment with the Cancer Center, please go directly to the Cancer Center and check in at the registration area.   Wear comfortable clothing and clothing appropriate for easy access to any Portacath or PICC line.   We strive to give you quality time with your provider. You may need to reschedule your appointment if you arrive late (15 or more minutes).  Arriving late affects you and other patients whose appointments are after yours.  Also, if you miss three or more appointments without notifying the office, you may be dismissed from the clinic at the provider's discretion.      For prescription refill requests, have your pharmacy contact our office and allow 72 hours for refills to be completed.    Today you received the following chemotherapy and/or immunotherapy agents: Trastuzumab , pertuzumab , and docetaxel       To help prevent nausea and vomiting after your treatment, we encourage you to take your nausea medication as directed.  BELOW ARE SYMPTOMS THAT SHOULD BE REPORTED IMMEDIATELY: *FEVER GREATER THAN 100.4 F (38 C) OR HIGHER *CHILLS OR SWEATING *NAUSEA AND VOMITING THAT IS NOT CONTROLLED WITH YOUR NAUSEA MEDICATION *UNUSUAL SHORTNESS OF BREATH *UNUSUAL BRUISING OR BLEEDING *URINARY PROBLEMS (pain or burning when urinating, or frequent urination) *BOWEL PROBLEMS (unusual diarrhea, constipation, pain near the anus) TENDERNESS IN MOUTH AND THROAT WITH OR WITHOUT PRESENCE OF ULCERS (sore throat, sores in mouth, or a toothache) UNUSUAL RASH, SWELLING OR PAIN  UNUSUAL VAGINAL DISCHARGE OR ITCHING   Items with * indicate a potential emergency and should be followed up as soon as possible or go to the Emergency Department if any problems should occur.  Please show the CHEMOTHERAPY  ALERT CARD or IMMUNOTHERAPY ALERT CARD at check-in to the Emergency Department and triage nurse.  Should you have questions after your visit or need to cancel or reschedule your appointment, please contact [facility] - A DEPT OF [facility]  Dept: [phone]  and follow the prompts.  Office hours are 8:00 a.m. to 4:30 p.m. Monday - Friday. Please note that voicemails left after 4:00 p.m. may not be returned until the following business day.  We are closed weekends and major holidays. You have access to a nurse at all times for urgent questions. Please call the main number to the clinic Dept: [phone] and follow the prompts.   For any non-urgent questions, you may also contact your provider using MyChart. We now offer e-Visits for anyone 66 and older to request care online for non-urgent symptoms. For details visit [email or web address].   Also download the MyChart app! Go to the app store, search "MyChart", open the app, select [facility], and log in with your MyChart username and password.

## 2024-02-13 NOTE — Progress Notes (Signed)
 [facility]  Telephone:([phone] [phone]    ID: Paige Foster DOB: [birth year]  MR#: [id number]  RDW#:[id number]  Patient Care Team: Katheen Roselie Rockford, NP as PCP - General (Internal Medicine) Kate Lonni CROME, MD as PCP - Cardiology (Cardiology) Ebbie Cough, MD as Consulting Physician (General Surgery) Nche, Roselie Rockford, NP as Nurse Practitioner (Internal Medicine) Tobie Baptist, MD as Consulting Physician (Ophthalmology) Legrand Victory CROME MOULD, MD as Consulting Physician (Gastroenterology) Abigail Maude POUR [facility]) OTHER MD:   CHIEF COMPLAINT: 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  She experiences significant anxiety and has not slept for three days. Her anxiety is related to elevated adrenaline levels and her current situation. She feels she needs medication to manage her anxiety, particularly to maintain focus at work, which she finds therapeutic. She recalls previously being prescribed a small pill for anxiety, possibly Xanax , and expresses a need for similar medication now. Managing her anxiety is important for her to continue working effectively.  Regarding her chemotherapy treatment, she is tolerating it well. She is eating better and denies any pain, tingling, numbness, changes in breathing, new cough, or trouble urinating. She drinks plenty of water  to maintain clear urine and prevent kidney injury, which she has experienced in the past.  She mentions not consistently taking her magnesium  pills but has started taking them again. She requests a doctor's note to confirm her visit for work purposes.  Rest of the pertinent 10 point ROS reviewed and neg.  HISTORY OF CURRENT ILLNESS: From the  original intake note:  Paige Foster has a prior history of left breast cancer, dating back to 2004. At that time she underwent a left mastectomy for stage 0 (noninvasive) breast cancer, with transverse rectus abdominis (TRAM) flap construction under Dr. Marcus. She also underwent a right breast reduction. She took tamoxifen for three years.  More recently she underwent bilateral diagnostic mammography with tomography and left breast ultrasonography at [facility] on 01/03/2018 showing: Breast Density Category B. There is an oval fat containing lesion in the left breast upper outer quadrant posterior depth. No other significant masses, calcifications, or other findings are seen in either breast. Sonographically, there is a 1.5 cm lesion in the left breast upper outer quadrant posterior depth. This lesion is of mixed echogenicity. This correlates as palpated and with mammography findings. Follow up was recommended.  Close follow-up was suggested.  She then presented with a non-tender mass in the left reconstructed breast on 08/30/2018. On physical exam, there is a hard palpable lump measuring 2.0 cm in the upper outer left reconstructed breast 10 cm from the expected location of a nipple. Sonography over this area demonstrates a 1.8 cm x 1.7 cm x 1.4 cm mass in the left breast at 2 o'clock posterior depth 10 cm from the nipple. This mass is of mixed echogenicity. This abnormality is increased in size and correlates as palpated and with prior mammography findings. Color flow imaging demonstrates that there is vascularity present. Elastography imaging assessment is intermediate. No significant abnormalities were seen sonographically in the left axilla.    Accordingly on 08/30/2018 she proceeded to biopsy of the left breast mass in question. The pathology from this procedure showed ([id number]): invasive ductal carcinoma, grade III. Prognostic indicators significant for: estrogen  receptor, 100% positive with  strong staining intensity and progesterone receptor, 0% negative. Proliferation marker Ki67 at 15%. HER2 positive (3+) by immunohistochemistry.  The patient's subsequent history is as detailed below.   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    Port-A-Cath in place 09/28/2018   Recurrent breast cancer, left [facility]) oncologist-- dr layla    dx 2004, noninvasive Stage 0 ----s/p left mastectomy w/ tram flap construction (and right breast reduction), taken Tamoxifen for 3 yrs;   08-30-2018 recurrent left cancer , Grade III,  cT1c,  ER positive, PR negative, HER-2 positive, invasive ductal carcinoma-- neoadjuvant chemo to start 09-26-2018   Renal artery stenosis (HCC)    mild right external renal artery stenosis per duplex in epic 08-09-2013   Wears glasses     PAST SURGICAL HISTORY: Past Surgical History:  Procedure Laterality Date   BREAST LUMPECTOMY WITH RADIOACTIVE SEED LOCALIZATION Left 02/20/2019   Procedure: LEFT BREAST LUMPECTOMY WITH RADIOACTIVE SEED LOCALIZATION;  Surgeon: Ebbie Cough, MD;  Location: [facility] OR;  Service: General;  Laterality: Left;   BREAST SURGERY Left    Transflap   COLONOSCOPY     COLONOSCOPY     IR IMAGING GUIDED PORT INSERTION  11/25/2023   LEFT HEART CATH AND CORONARY ANGIOGRAPHY N/A 12/16/2023   Procedure: LEFT HEART CATH AND CORONARY ANGIOGRAPHY;  Surgeon: Swaziland, Peter M, MD;  Location: [facility] INVASIVE CV LAB;  Service: Cardiovascular;  Laterality: N/A;   MASTECTOMY Left 2004   w/  TRAM flap construction and right breast augmentation with abdominoplasy   PARS PLANA VITRECTOMY Left 12/18/2018   Procedure: PARS PLANA VITRECTOMY WITH 25 GAUGE, ENDOLASER;  Surgeon: Tobie Baptist, MD;  Location: [facility] OR;  Service: Ophthalmology;  Laterality: Left;   PORTACATH PLACEMENT N/A  09/25/2018   Procedure: INSERTION PORT-A-CATH WITH ULTRASOUND;  Surgeon: Ebbie Cough, MD;  Location: [facility] ORS;  Service: General;  Laterality: N/A;   TUBAL LIGATION Bilateral yrs ago    FAMILY HISTORY: Family History  Problem Relation Age of Onset   Diabetes Mother    Hypertension Mother    Kidney disease Father    Colon cancer Neg Hx    Colon polyps Neg Hx    Gallbladder disease Neg Hx    Heart disease Neg Hx    Esophageal cancer Neg Hx    Stomach cancer Neg Hx    Rectal cancer Neg Hx   Eymi's father died from unknown causes in his early 71's. Patients' mother died from diabetes complications at age 59. The patient has 1 sister. Patient denies anyone in her family having breast, ovarian, prostate, or pancreatic cancer.    GYNECOLOGIC HISTORY:  Patient's last menstrual period was 06/08/2007. 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 Her husband, Zachary, works at [employer]. Berlinda has two children, Fonda and Alm. Fonda lives with her, is 30, and it attending [employer] for a computer based degree. Alm lives with her, is 68, and recently graduated from [employer] with a degree in Pension scheme manager.  He works for [employer]. Maisie has no grandchildren. She attends  the [employer].   ADVANCED DIRECTIVES: In the absence of any documents to the contrary her husband, Zachary, is automatically her healthcare power of attorney     HEALTH MAINTENANCE: Social History   Tobacco Use   Smoking status: Never    Passive exposure: Never   Smokeless tobacco: Never  Vaping Use   Vaping status: Never Used  Substance Use Topics   Alcohol use: Not Currently    Alcohol/week: 0.0 standard drinks of alcohol    Comment: Occassionally   Drug use: No    Colonoscopy: April 2022, danis  PAP: January 2022,  Nche  Bone density:  2017; -1.6, osteopenic   Allergies  Allergen Reactions   Bee Pollen Itching    Watery eyes and nose running    Current Outpatient Medications  Medication Sig Dispense Refill   amLODipine  (NORVASC ) 10 MG tablet Take 1 tablet by mouth daily. 90 tablet 2   anastrozole  (ARIMIDEX ) 1 MG tablet TAKE 1 TABLET(1 MG) BY MOUTH DAILY 90 tablet 4   atorvastatin  (LIPITOR) 20 MG tablet Take 1 tablet (20 mg total) by mouth daily. 90 tablet 3   brimonidine  (ALPHAGAN ) 0.2 % ophthalmic solution Place 1 drop into both eyes 3 (three) times daily.     dexamethasone  (DECADRON ) 4 MG tablet Take 2 tabs by mouth 2 times daily starting day before chemo. Then take 2 tabs daily for 2 days starting day after chemo. Take with food. 30 tablet 1   DOCEtaxel  (TAXOTERE ) 20 MG/ML CONC Inject 121 mg into the vein once.     dorzolamide -timolol  (COSOPT ) 2-0.5 % ophthalmic solution 1 drop 2 (two) times daily.     lidocaine -prilocaine  (EMLA ) cream Apply to affected area 1 hour before access prn 30 g 3   losartan  (COZAAR ) 50 MG tablet TAKE 1 TABLET(50 MG) BY MOUTH DAILY 90 tablet 3   magnesium  oxide (MAG-OX) 400 (240 Mg) MG tablet Take 1 tablet (400 mg total) by mouth 2 (two) times daily. 10 tablet 0   ondansetron  (ZOFRAN ) 8 MG tablet Take 1 tablet (8 mg total) by mouth every 8 (eight) hours as needed for nausea or vomiting. 20 tablet 3   pegfilgrastim  (NEULASTA  ONPRO KIT) 6 MG/0.6ML injection Inject 6 mg into the skin once. Inject via provided programmed delivery device.     pertuzumab  (PERJETA ) 420 MG/14ML SOLN Inject 420 mg into the vein once.     prochlorperazine  (COMPAZINE ) 10 MG tablet Take 1 tablet (10 mg total) by mouth every 6 (six) hours as needed for nausea or vomiting. 30 tablet 1   trastuzumab -anns 336 mg in sodium chloride  0.9 % 250 mL Inject 336 mg into the vein once.     zoledronic  acid (ZOMETA ) 4 MG/5ML injection Inject 4 mg into the vein once.     No current facility-administered medications  for this visit.     OBJECTIVE: African-American woman who appears stated age  There were no vitals filed for this visit.      There is no height or weight on file to calculate BMI.   Wt Readings from Last 3 Encounters:  01/26/24 120 lb 6.4 oz (54.6 kg)  01/18/24 120 lb 4.8 oz (54.6 kg)  01/10/24 121 lb 3.2 oz (55 kg)   Physical Exam Constitutional:      Appearance: Normal appearance.  Cardiovascular:     Rate and Rhythm: Normal rate and regular rhythm.     Pulses: Normal pulses.     Heart sounds: Normal heart sounds.  Pulmonary:  Effort: Pulmonary effort is normal.     Breath sounds: Normal breath sounds.  Musculoskeletal:        General: No swelling.     Cervical back: Normal range of motion and neck supple. No rigidity.  Lymphadenopathy:     Cervical: No cervical adenopathy.  Skin:    General: Skin is warm and dry.  Neurological:     General: No focal deficit present.     Mental Status: She is alert.       LAB RESULTS:  CMP     Component Value Date/Time   NA 136 01/18/2024 0733   NA 140 10/12/2017 0950   K 3.4 (L) 01/18/2024 0733   CL 105 01/18/2024 0733   CO2 25 01/18/2024 0733   GLUCOSE 103 (H) 01/18/2024 0733   BUN 12 01/18/2024 0733   BUN 10 10/12/2017 0950   CREATININE 0.66 01/18/2024 0733   CALCIUM  8.9 01/18/2024 0733   PROT 6.5 01/18/2024 0733   PROT 7.5 10/12/2017 0950   ALBUMIN 3.8 01/18/2024 0733   ALBUMIN 4.3 10/12/2017 0950   AST 21 01/18/2024 0733   ALT 17 01/18/2024 0733   ALKPHOS 131 (H) 01/18/2024 0733   BILITOT 0.3 01/18/2024 0733   GFRNONAA >60 01/18/2024 0733   GFRAA >60 12/06/2019 1441   GFRAA >60 09/07/2018 1455   Lab Results  Component Value Date   WBC 5.9 01/18/2024   NEUTROABS 4.4 01/18/2024   HGB 10.1 (L) 01/18/2024   HCT 29.2 (L) 01/18/2024   MCV 92.1 01/18/2024   PLT 289 01/18/2024    Lab Results  Component Value Date   LABCA2 <4 08/30/2007    No components found for: [id number]  No results for  input(s): INR in the last 168 hours.   Lab Results  Component Value Date   LABCA2 <4 08/30/2007    No results found for: CAN199  No results found for: CAN125  Lab Results  Component Value Date   CAN153 23.9 10/27/2023    Lab Results  Component Value Date   CA2729 27.9 10/27/2023    No components found for: HGQUANT  No results found for: CEA1, CEA / No results found for: CEA1, CEA  No results found for: AFPTUMOR  No results found for: CHROMOGRNA  No results found for: TOTALPROTELP, ALBUMINELP, A1GS, A2GS, BETS, BETA2SER, GAMS, MSPIKE, SPEI (this displays SPEP labs)  No results found for: KPAFRELGTCHN, LAMBDASER, KAPLAMBRATIO (kappa/lambda light chains)  No results found for: HGBA, HGBA2QUANT, HGBFQUANT, HGBSQUAN (Hemoglobinopathy evaluation)   Lab Results  Component Value Date   LDH 169 08/30/2007    Lab Results  Component Value Date   IRON 96 07/11/2023   TIBC 382 07/11/2023   IRONPCTSAT 25 07/11/2023   (Iron and TIBC)  Lab Results  Component Value Date   FERRITIN 261 07/11/2023    Urinalysis    Component Value Date/Time   COLORURINE STRAW (A) 12/15/2023 0809   APPEARANCEUR CLEAR 12/15/2023 0809   LABSPEC 1.010 12/15/2023 0809   PHURINE 5.0 12/15/2023 0809   GLUCOSEU NEGATIVE 12/15/2023 0809   HGBUR NEGATIVE 12/15/2023 0809   HGBUR large 04/09/2008 1548   BILIRUBINUR NEGATIVE 12/15/2023 0809   KETONESUR NEGATIVE 12/15/2023 0809   PROTEINUR NEGATIVE 12/15/2023 0809   UROBILINOGEN 0.2 04/09/2008 1548   NITRITE NEGATIVE 12/15/2023 0809   LEUKOCYTESUR NEGATIVE 12/15/2023 0809    STUDIES:  CT CHEST ABDOMEN PELVIS W CONTRAST Result Date: 02/02/2024 EXAM:  CT CHEST ABDOMEN PELVIS WITH IV CONTRAST INDICATION:  metastatic breast cancer  TECHNIQUE: Spiral CT scanning was performed through the chest, abdomen and pelvis after the patient received IV contrast. COMPARISON: 10/26/2023, 12/15/2023 FINDINGS:  The cardiac size is within normal limits. There is no thoracic aortic aneurysm. No filling defects are identified in the central pulmonary arteries. There are stable postsurgical changes in the right breast. A right-sided chest wall port is present. The esophagus and thyroid  glands have a normal appearance. The left chest wall mass measures 6.3 x 3.7 cm, previously 6.0 x 3.6 cm at the same level. A small left pleural effusion has developed. The lungs are clear except for mild left basilar atelectasis. No pulmonary mass is identified. Innumerable hepatic masses are again identified. On the current exam, there is more central necrosis in many of the masses. The overall size of the liver is mildly increased. It measures 20.9 cm craniocaudal, previously 19.2 cm. There is no significant abnormality identified in the liver, spleen, pancreas and gallbladder. No calculus, obstruction, or soft tissue mass is present involving the kidneys. There are no adrenal masses. The abdominal bowel loops are unremarkable. There is no evidence of ascites or adenopathy. No abdominal aortic aneurysm is present. The bladder, uterus and adnexa have a normal appearance. There is no inguinal hernia. The pelvic bowel loops have a normal appearance. The appendix has a normal appearance. The destructive mass in the left iliac crest measures 3.7 x 2.4 cm, previously 3.6 x 2.4 cm the destructive right ileal mass measures 3.7 x 2.8 cm, previously 3.9 x 3.1 cm. Numerous smaller additional destructive masses are identified in the spine and pelvis. IMPRESSION: 1. No significant change in left chest wall metastasis. 2. Development of small left pleural effusion. 3. Central necrotic changes have developed in the numerous hepatic metastases. The overall size of the liver shows a mild increase. 4. No significant change in osseous metastases. Please note that CT scanning at this site utilizes multiple dose reduction techniques, including automatic exposure  control, adjustment of the MAA and/or KVP according to the patient's size, and use of iterative reconstruction. Electronically signed by: Eddy Oar MD 02/02/2024 11:37 AM EDT RP Workstation: [id number]      ELIGIBLE FOR AVAILABLE RESEARCH PROTOCOL: No  ASSESSMENT: 58 y.o. [place], [place] woman  (1) history of left-sided ductal carcinoma in situ 2004  (a) s/p left mastectomy with TRAM reconstruction  (b) status post tamoxifen x3 years  (2) left breast upper outer quadrant biopsy 08/30/2018 shows a clinical T1c N0 invasive ductal carcinoma, grade 3, estrogen receptor strongly positive, progesterone receptor negative, with HER-2 amplification, and and MIB-1 of 15%.   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    (b) chemotherapy discontinued after 4 cycles because of intercurrent eye surgery  (c) continued trastuzumab  and pertuzumab  to complete a year (last dose 09/06/2019)  (d) echocardiogram on 01/23/2019 that shows well preserved EF of 60-65%  (e) echocardiogram on 04/23/2019 shows EF of 60-65%  (f) echocardiogram 08/02/2019 shows an ejection fraction in the 60-65% range  (4) left lumpectomy 02/20/2019 showed a residual  ypT1c NX invasive ductal carcinoma, grade 2 with negative margins.    (5) adjuvant radiation: Radiation Treatment Dates: 04/12/2019 through 05/28/2019 Site Technique Total Dose (Gy) Dose per Fx (Gy) Completed Fx  Beam Energies  Breast: CW_Lt 3D 50.4/50.4 1.8 28/28 6X, 10X  Breast: CW_Lt_SCV_PAB 3D 50.4/50.4 1.8 28/28 6X, 10X  Breast: CW_Lt_Bst Electron 10/10 2 5/5 6X, 10X   (6) anastrozole  started 06/26/2019  (a) [facility] 12/24/2015 found  a T score of -1.6 DEXA scan from mass June 2023 showed T score of -1.4 at L1-L2  (7) genetics testing 02/03/2019 through the Common Hereditary Cancers Panel offered by [facility] found no deleterious mutations in APC, ATM, AXIN2, BARD1, BMPR1A, BRCA1, BRCA2, BRIP1, CDH1, CDKN2A (p14ARF), CDKN2A (p16INK4a), CKD4, CHEK2, CTNNA1, DICER1, EPCAM (Deletion/duplication testing only), GREM1 (promoter region deletion/duplication testing only), KIT, MEN1, MLH1, MSH2, MSH3, MSH6, MUTYH, NBN, NF1, NHTL1, PALB2, PDGFRA, PMS2, POLD1, POLE, PTEN, RAD50, RAD51C, RAD51D, RNF43, SDHB, SDHC, SDHD, SMAD4, SMARCA4. STK11, TP53, TSC1, TSC2, and VHL.  The following genes were evaluated for sequence changes only: SDHA and HOXB13 c.251G>A variant only.  (8) She had a CXR for unintentional weight loss,  this showed possible left lung pleural based mass and posterior right eighth rib met lesion. CT showed 3.8 x 5.5 x 5 cm left anterior chest wall mass along the mid axillary line with rib involvement and destruction and low-attenuation changes of the center indicating necrotic mass correlates with a neoplastic lesion likely metastatic disease. Liver is enlarged, inhomogeneous with multiple enhancing lesions throughout the right and left lobe of the liver consistent with extensive numerous metastatic liver lesions. Liver appears completely replaced by metastatic disease. Lytic metastatic bone lesions involving the right anterior superior iliac crest, left iliac bone, right inferior pubic ramus, left iliac bone, and L4 vertebral body. Suggestive of lytic bone changes involving the upper thoracic vertebral bodies T1-T2.  PLAN:  Assessment & Plan Metastatic Breast Cancer HER2 positive and estrogen receptor positive breast cancer confirmed. Standard treatment involves chemotherapy and antibodies, docetaxel  with HP, currently on C4.  Assessment & Plan  Undergoing chemotherapy with stable disease ( but noted increasing necrotic areas in  the liver consistent with response), indicating effective treatment. Anticipate further tumor shrinkage. - Continue current chemotherapy regimen. - After cycle 6, will transition to HP maintenance. - Monitor blood work during treatment.  Anxiety Persistent anxiety causing sleep disturbances. Anti-anxiety medication helpful. Working is therapeutic but requires medication. - Prescribe anti-anxiety medication as needed, caution for habit formation. - Send prescription to [facility] on [address].   Amber Stalls, MD  Medical Oncology and Hematology [facility] [address] [place], [place] [address] Tel. [phone]    Fax. [phone]  *Total Encounter Time as defined by the Centers for Medicare and Medicaid Services includes, in addition to the face-to-face time of a patient visit (documented in the note above) non-face-to-face time: obtaining and reviewing outside history, ordering and reviewing medications, tests or procedures, care coordination (communications with other health care professionals or caregivers) and documentation in the medical record.

## 2024-02-15 ENCOUNTER — Inpatient Hospital Stay

## 2024-02-15 ENCOUNTER — Telehealth: Payer: Self-pay | Admitting: Hematology and Oncology

## 2024-02-16 ENCOUNTER — Inpatient Hospital Stay

## 2024-02-16 VITALS — BP 122/82 | HR 85 | Temp 98.4°F | Resp 18

## 2024-02-16 DIAGNOSIS — Z170 Estrogen receptor positive status [ER+]: Secondary | ICD-10-CM | POA: Diagnosis not present

## 2024-02-16 DIAGNOSIS — C7951 Secondary malignant neoplasm of bone: Secondary | ICD-10-CM | POA: Diagnosis not present

## 2024-02-16 DIAGNOSIS — Z79811 Long term (current) use of aromatase inhibitors: Secondary | ICD-10-CM | POA: Diagnosis not present

## 2024-02-16 DIAGNOSIS — F419 Anxiety disorder, unspecified: Secondary | ICD-10-CM | POA: Diagnosis not present

## 2024-02-16 DIAGNOSIS — Z9013 Acquired absence of bilateral breasts and nipples: Secondary | ICD-10-CM | POA: Diagnosis not present

## 2024-02-16 DIAGNOSIS — Z5111 Encounter for antineoplastic chemotherapy: Secondary | ICD-10-CM | POA: Diagnosis not present

## 2024-02-16 DIAGNOSIS — Z79899 Other long term (current) drug therapy: Secondary | ICD-10-CM | POA: Diagnosis not present

## 2024-02-16 DIAGNOSIS — C50912 Malignant neoplasm of unspecified site of left female breast: Secondary | ICD-10-CM

## 2024-02-16 DIAGNOSIS — C50412 Malignant neoplasm of upper-outer quadrant of left female breast: Secondary | ICD-10-CM | POA: Diagnosis not present

## 2024-02-16 MED ORDER — PEGFILGRASTIM-CBQV 6 MG/0.6ML ~~LOC~~ SOSY
6.0000 mg | PREFILLED_SYRINGE | Freq: Once | SUBCUTANEOUS | Status: AC
  Administered 2024-02-16: 6 mg via SUBCUTANEOUS
  Filled 2024-02-16: qty 0.6

## 2024-02-28 DIAGNOSIS — H00022 Hordeolum internum right lower eyelid: Secondary | ICD-10-CM | POA: Diagnosis not present

## 2024-03-05 ENCOUNTER — Inpatient Hospital Stay: Attending: Hematology and Oncology | Admitting: Hematology and Oncology

## 2024-03-05 ENCOUNTER — Inpatient Hospital Stay

## 2024-03-05 VITALS — BP 97/46 | HR 66 | Temp 99.9°F | Resp 13 | Wt 119.0 lb

## 2024-03-05 DIAGNOSIS — C50912 Malignant neoplasm of unspecified site of left female breast: Secondary | ICD-10-CM

## 2024-03-05 DIAGNOSIS — C7951 Secondary malignant neoplasm of bone: Secondary | ICD-10-CM | POA: Diagnosis not present

## 2024-03-05 DIAGNOSIS — Z5111 Encounter for antineoplastic chemotherapy: Secondary | ICD-10-CM | POA: Insufficient documentation

## 2024-03-05 DIAGNOSIS — C787 Secondary malignant neoplasm of liver and intrahepatic bile duct: Secondary | ICD-10-CM | POA: Insufficient documentation

## 2024-03-05 DIAGNOSIS — Z79899 Other long term (current) drug therapy: Secondary | ICD-10-CM | POA: Insufficient documentation

## 2024-03-05 DIAGNOSIS — H401123 Primary open-angle glaucoma, left eye, severe stage: Secondary | ICD-10-CM | POA: Diagnosis not present

## 2024-03-05 DIAGNOSIS — Z9013 Acquired absence of bilateral breasts and nipples: Secondary | ICD-10-CM | POA: Insufficient documentation

## 2024-03-05 DIAGNOSIS — H2511 Age-related nuclear cataract, right eye: Secondary | ICD-10-CM | POA: Diagnosis not present

## 2024-03-05 DIAGNOSIS — C50412 Malignant neoplasm of upper-outer quadrant of left female breast: Secondary | ICD-10-CM

## 2024-03-05 DIAGNOSIS — Z170 Estrogen receptor positive status [ER+]: Secondary | ICD-10-CM | POA: Diagnosis not present

## 2024-03-05 DIAGNOSIS — H35363 Drusen (degenerative) of macula, bilateral: Secondary | ICD-10-CM | POA: Diagnosis not present

## 2024-03-05 DIAGNOSIS — H59812 Chorioretinal scars after surgery for detachment, left eye: Secondary | ICD-10-CM | POA: Diagnosis not present

## 2024-03-05 DIAGNOSIS — Z79811 Long term (current) use of aromatase inhibitors: Secondary | ICD-10-CM | POA: Insufficient documentation

## 2024-03-05 LAB — CBC WITH DIFFERENTIAL (CANCER CENTER ONLY)
Abs Immature Granulocytes: 0.02 K/uL (ref 0.00–0.07)
Basophils Absolute: 0 K/uL (ref 0.0–0.1)
Basophils Relative: 0 %
Eosinophils Absolute: 0 K/uL (ref 0.0–0.5)
Eosinophils Relative: 0 %
HCT: 28.8 % — ABNORMAL LOW (ref 36.0–46.0)
Hemoglobin: 10.1 g/dL — ABNORMAL LOW (ref 12.0–15.0)
Immature Granulocytes: 0 %
Lymphocytes Relative: 18 %
Lymphs Abs: 0.8 K/uL (ref 0.7–4.0)
MCH: 33.4 pg (ref 26.0–34.0)
MCHC: 35.1 g/dL (ref 30.0–36.0)
MCV: 95.4 fL (ref 80.0–100.0)
Monocytes Absolute: 0.7 K/uL (ref 0.1–1.0)
Monocytes Relative: 14 %
Neutro Abs: 3.2 K/uL (ref 1.7–7.7)
Neutrophils Relative %: 68 %
Platelet Count: 327 K/uL (ref 150–400)
RBC: 3.02 MIL/uL — ABNORMAL LOW (ref 3.87–5.11)
RDW: 15.9 % — ABNORMAL HIGH (ref 11.5–15.5)
WBC Count: 4.8 K/uL (ref 4.0–10.5)
nRBC: 0 % (ref 0.0–0.2)

## 2024-03-05 LAB — CMP (CANCER CENTER ONLY)
ALT: 16 U/L (ref 0–44)
AST: 17 U/L (ref 15–41)
Albumin: 3.9 g/dL (ref 3.5–5.0)
Alkaline Phosphatase: 123 U/L (ref 38–126)
Anion gap: 3 — ABNORMAL LOW (ref 5–15)
BUN: 13 mg/dL (ref 6–20)
CO2: 28 mmol/L (ref 22–32)
Calcium: 9.2 mg/dL (ref 8.9–10.3)
Chloride: 105 mmol/L (ref 98–111)
Creatinine: 0.57 mg/dL (ref 0.44–1.00)
GFR, Estimated: >60 mL/min (ref >60)
Glucose, Bld: 91 mg/dL (ref 70–99)
Potassium: 4 mmol/L (ref 3.5–5.1)
Sodium: 136 mmol/L (ref 135–145)
Total Bilirubin: 0.2 mg/dL (ref 0.0–1.2)
Total Protein: 6.4 g/dL — ABNORMAL LOW (ref 6.5–8.1)

## 2024-03-05 MED ORDER — SODIUM CHLORIDE 0.9% FLUSH
10.0000 mL | Freq: Once | INTRAVENOUS | Status: AC
Start: 2024-03-05 — End: 2024-03-05
  Administered 2024-03-05 (×2): 10 mL

## 2024-03-05 NOTE — Progress Notes (Signed)
 [facility]  Telephone:([phone]) [phone] Fax:(336) [phone]    ID: Paige Foster DOB: [birth year]  MR#: [id number]  RDW#:[id number]  Patient Care Team: Katheen Roselie Rockford, NP as PCP - General (Internal Medicine) Kate Lonni CROME, MD as PCP - Cardiology (Cardiology) Ebbie Cough, MD as Consulting Physician (General Surgery) Nche, Roselie Rockford, NP as Nurse Practitioner (Internal Medicine) Tobie Baptist, MD as Consulting Physician (Ophthalmology) Legrand Victory CROME MOULD, MD as Consulting Physician (Gastroenterology) Abigail Maude POUR [facility]) OTHER MD:   CHIEF COMPLAINT: 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  She missed her scheduled chemotherapy infusion appointment today due to arriving more than an hour late. The infusion needs to be rescheduled.  There are no new side effects from her chemotherapy treatment since the last visit. No tingling, numbness, nausea, vomiting, diarrhea, pain, urination issues, headaches, or double vision. Breathing is stable, and she is able to manage work and climb a flight of stairs without difficulty.  Her anxiety has improved, and she is taking her anxiety medication as needed. There are no significant weight changes, and her urine is clear, indicating adequate hydration.  Her recent CT scans from July were stable.  Rest of the pertinent 10 point ROS reviewed and neg.  HISTORY OF CURRENT ILLNESS: From the original intake note:  Paige Foster has a prior history of left breast cancer, dating back to 2004. At that time she underwent a left mastectomy for stage 0 (noninvasive) breast cancer, with transverse rectus abdominis (TRAM) flap construction under Dr. Marcus. She also underwent a  right breast reduction. She took tamoxifen for three years.  More recently she underwent bilateral diagnostic mammography with tomography and left breast ultrasonography at [facility] on 01/03/2018 showing: Breast Density Category B. There is an oval fat containing lesion in the left breast upper outer quadrant posterior depth. No other significant masses, calcifications, or other findings are seen in either breast. Sonographically, there is a 1.5 cm lesion in the left breast upper outer quadrant posterior depth. This lesion is of mixed echogenicity. This correlates as palpated and with mammography findings. Follow up was recommended.  Close follow-up was suggested.  She then presented with a non-tender mass in the left reconstructed breast on 08/30/2018. On physical exam, there is a hard palpable lump measuring 2.0 cm in the upper outer left reconstructed breast 10 cm from the expected location of a nipple. Sonography over this area demonstrates a 1.8 cm x 1.7 cm x 1.4 cm mass in the left breast at 2 o'clock posterior depth 10 cm from the nipple. This mass is of mixed echogenicity. This abnormality is increased in size and correlates as palpated and with prior mammography findings. Color flow imaging demonstrates that there is vascularity present. Elastography imaging assessment is intermediate. No significant abnormalities were seen sonographically in the left axilla.    Accordingly on 08/30/2018 she proceeded to biopsy of the left breast mass in question. The pathology from this procedure showed ([id number]): invasive ductal carcinoma, grade III. Prognostic indicators significant for: estrogen receptor, 100% positive with strong staining intensity and progesterone receptor, 0% negative. Proliferation marker Ki67 at 15%. HER2 positive (3+) by immunohistochemistry.  The patient's subsequent history is as detailed below.   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    Port-A-Cath in place 09/28/2018   Recurrent breast cancer, left [facility]) oncologist-- dr layla    dx 2004, noninvasive Stage 0 ----s/p left mastectomy w/ tram flap construction (and right breast reduction), taken Tamoxifen for 3 yrs;   08-30-2018 recurrent left cancer , Grade III,  cT1c,  ER positive, PR negative, HER-2 positive, invasive ductal carcinoma-- neoadjuvant chemo to start 09-26-2018   Renal artery stenosis (HCC)    mild right external renal artery stenosis per duplex in epic 08-09-2013   Wears glasses     PAST SURGICAL HISTORY: Past Surgical History:  Procedure Laterality Date   BREAST LUMPECTOMY WITH RADIOACTIVE SEED LOCALIZATION Left 02/20/2019   Procedure: LEFT BREAST LUMPECTOMY WITH RADIOACTIVE SEED LOCALIZATION;  Surgeon: Ebbie Cough, MD;  Location: [facility] OR;  Service: General;  Laterality: Left;   BREAST SURGERY Left    Transflap   COLONOSCOPY     COLONOSCOPY     IR IMAGING GUIDED PORT INSERTION  11/25/2023   LEFT HEART CATH AND CORONARY ANGIOGRAPHY N/A 12/16/2023   Procedure: LEFT HEART CATH AND CORONARY ANGIOGRAPHY;  Surgeon: Swaziland, Peter M, MD;  Location: [facility] INVASIVE CV LAB;  Service: Cardiovascular;  Laterality: N/A;   MASTECTOMY Left 2004   w/  TRAM flap construction and right breast augmentation with abdominoplasy   PARS PLANA VITRECTOMY Left 12/18/2018   Procedure: PARS PLANA VITRECTOMY WITH 25 GAUGE, ENDOLASER;  Surgeon: Tobie Baptist, MD;  Location: [facility] OR;  Service: Ophthalmology;  Laterality: Left;   PORTACATH PLACEMENT N/A 09/25/2018   Procedure: INSERTION PORT-A-CATH WITH ULTRASOUND;  Surgeon: Ebbie Cough, MD;  Location: [facility] ORS;  Service: General;  Laterality: N/A;   TUBAL LIGATION Bilateral yrs ago    FAMILY HISTORY: Family History  Problem Relation Age of Onset   Diabetes Mother     Hypertension Mother    Kidney disease Father    Colon cancer Neg Hx    Colon polyps Neg Hx    Gallbladder disease Neg Hx    Heart disease Neg Hx    Esophageal cancer Neg Hx    Stomach cancer Neg Hx    Rectal cancer Neg Hx   Kayani's father died from unknown causes in his early 21's. Patients' mother died from diabetes complications at age 91. The patient has 1 sister. Patient denies anyone in her family having breast, ovarian, prostate, or pancreatic cancer.    GYNECOLOGIC HISTORY:  Patient's last menstrual period was 06/08/2007. 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 Her husband, Zachary, works at [employer]. Daizy has two children, Fonda and Alm. Fonda lives with her, is 70, and it attending [employer] for a computer based degree. Alm lives with her, is 65, and recently graduated from [employer] with a degree in Pension scheme manager.  He works for [employer]. Livy has no grandchildren. She attends the [employer].   ADVANCED DIRECTIVES: In the absence of any documents to the contrary her husband, Zachary, is automatically her healthcare power of attorney     HEALTH MAINTENANCE: Social History   Tobacco Use   Smoking status: Never    Passive  exposure: Never   Smokeless tobacco: Never  Vaping Use   Vaping status: Never Used  Substance Use Topics   Alcohol use: Not Currently    Alcohol/week: 0.0 standard drinks of alcohol    Comment: Occassionally   Drug use: No    Colonoscopy: April 2022, danis  PAP: January 2022, Nche  Bone density:  2017; -1.6, osteopenic   Allergies  Allergen Reactions   Bee Pollen Itching    Watery eyes and nose running    Current Outpatient Medications  Medication Sig Dispense Refill   ALPRAZolam  (XANAX ) 0.5 MG tablet Take 1 tablet (0.5 mg total) by mouth at  bedtime as needed for anxiety. 30 tablet 0   amLODipine  (NORVASC ) 10 MG tablet Take 1 tablet by mouth daily. (Patient taking differently: Take 1 tablet by mouth daily. As needed) 90 tablet 2   anastrozole  (ARIMIDEX ) 1 MG tablet TAKE 1 TABLET(1 MG) BY MOUTH DAILY 90 tablet 4   atorvastatin  (LIPITOR) 20 MG tablet Take 1 tablet (20 mg total) by mouth daily. 90 tablet 3   brimonidine  (ALPHAGAN ) 0.2 % ophthalmic solution Place 1 drop into both eyes 3 (three) times daily.     dexamethasone  (DECADRON ) 4 MG tablet Take 2 tabs by mouth 2 times daily starting day before chemo. Then take 2 tabs daily for 2 days starting day after chemo. Take with food. 30 tablet 1   DOCEtaxel  (TAXOTERE ) 20 MG/ML CONC Inject 121 mg into the vein once.     dorzolamide -timolol  (COSOPT ) 2-0.5 % ophthalmic solution 1 drop 2 (two) times daily.     lidocaine -prilocaine  (EMLA ) cream Apply to affected area 1 hour before access prn 30 g 3   losartan  (COZAAR ) 50 MG tablet TAKE 1 TABLET(50 MG) BY MOUTH DAILY 90 tablet 3   magnesium  oxide (MAG-OX) 400 (240 Mg) MG tablet Take 1 tablet (400 mg total) by mouth 2 (two) times daily. 10 tablet 0   ondansetron  (ZOFRAN ) 8 MG tablet Take 1 tablet (8 mg total) by mouth every 8 (eight) hours as needed for nausea or vomiting. 20 tablet 3   pegfilgrastim  (NEULASTA  ONPRO KIT) 6 MG/0.6ML injection Inject 6 mg into the skin once. Inject via provided programmed delivery device.     pertuzumab  (PERJETA ) 420 MG/14ML SOLN Inject 420 mg into the vein once.     prochlorperazine  (COMPAZINE ) 10 MG tablet Take 1 tablet (10 mg total) by mouth every 6 (six) hours as needed for nausea or vomiting. 30 tablet 1   trastuzumab -anns 336 mg in sodium chloride  0.9 % 250 mL Inject 336 mg into the vein once.     zoledronic  acid (ZOMETA ) 4 MG/5ML injection Inject 4 mg into the vein once.     No current facility-administered medications for this visit.     OBJECTIVE: African-American woman who appears stated age  There  were no vitals filed for this visit.      There is no height or weight on file to calculate BMI.   Wt Readings from Last 3 Encounters:  02/13/24 118 lb 5 oz (53.7 kg)  01/26/24 120 lb 6.4 oz (54.6 kg)  01/18/24 120 lb 4.8 oz (54.6 kg)   Physical Exam Constitutional:      Appearance: Normal appearance.  Cardiovascular:     Rate and Rhythm: Normal rate and regular rhythm.     Pulses: Normal pulses.     Heart sounds: Normal heart sounds.  Pulmonary:     Effort: Pulmonary effort is normal.  Breath sounds: Normal breath sounds.  Musculoskeletal:        General: No swelling.     Cervical back: Normal range of motion and neck supple. No rigidity.  Lymphadenopathy:     Cervical: No cervical adenopathy.  Skin:    General: Skin is warm and dry.  Neurological:     General: No focal deficit present.     Mental Status: She is alert.       LAB RESULTS:  CMP     Component Value Date/Time   NA 136 02/13/2024 0827   NA 140 10/12/2017 0950   K 4.0 02/13/2024 0827   CL 105 02/13/2024 0827   CO2 26 02/13/2024 0827   GLUCOSE 96 02/13/2024 0827   BUN 10 02/13/2024 0827   BUN 10 10/12/2017 0950   CREATININE 0.61 02/13/2024 0827   CALCIUM  9.1 02/13/2024 0827   PROT 6.9 02/13/2024 0827   PROT 7.5 10/12/2017 0950   ALBUMIN 4.0 02/13/2024 0827   ALBUMIN 4.3 10/12/2017 0950   AST 25 02/13/2024 0827   ALT 25 02/13/2024 0827   ALKPHOS 136 (H) 02/13/2024 0827   BILITOT 0.3 02/13/2024 0827   GFRNONAA >60 02/13/2024 0827   GFRAA >60 12/06/2019 1441   GFRAA >60 09/07/2018 1455   Lab Results  Component Value Date   WBC 4.7 02/13/2024   NEUTROABS 3.2 02/13/2024   HGB 10.5 (L) 02/13/2024   HCT 30.3 (L) 02/13/2024   MCV 94.7 02/13/2024   PLT 254 02/13/2024    Lab Results  Component Value Date   LABCA2 <4 08/30/2007    No components found for: [id number]  No results for input(s): INR in the last 168 hours.   Lab Results  Component Value Date   LABCA2 <4 08/30/2007     No results found for: CAN199  No results found for: CAN125  Lab Results  Component Value Date   CAN153 23.9 10/27/2023    Lab Results  Component Value Date   CA2729 27.9 10/27/2023    No components found for: HGQUANT  No results found for: CEA1, CEA / No results found for: CEA1, CEA  No results found for: AFPTUMOR  No results found for: CHROMOGRNA  No results found for: TOTALPROTELP, ALBUMINELP, A1GS, A2GS, BETS, BETA2SER, GAMS, MSPIKE, SPEI (this displays SPEP labs)  No results found for: KPAFRELGTCHN, LAMBDASER, KAPLAMBRATIO (kappa/lambda light chains)  No results found for: HGBA, HGBA2QUANT, HGBFQUANT, HGBSQUAN (Hemoglobinopathy evaluation)   Lab Results  Component Value Date   LDH 169 08/30/2007    Lab Results  Component Value Date   IRON 96 07/11/2023   TIBC 382 07/11/2023   IRONPCTSAT 25 07/11/2023   (Iron and TIBC)  Lab Results  Component Value Date   FERRITIN 261 07/11/2023    Urinalysis    Component Value Date/Time   COLORURINE STRAW (A) 12/15/2023 0809   APPEARANCEUR CLEAR 12/15/2023 0809   LABSPEC 1.010 12/15/2023 0809   PHURINE 5.0 12/15/2023 0809   GLUCOSEU NEGATIVE 12/15/2023 0809   HGBUR NEGATIVE 12/15/2023 0809   HGBUR large 04/09/2008 1548   BILIRUBINUR NEGATIVE 12/15/2023 0809   KETONESUR NEGATIVE 12/15/2023 0809   PROTEINUR NEGATIVE 12/15/2023 0809   UROBILINOGEN 0.2 04/09/2008 1548   NITRITE NEGATIVE 12/15/2023 0809   LEUKOCYTESUR NEGATIVE 12/15/2023 0809    STUDIES:  No results found.     ELIGIBLE FOR AVAILABLE RESEARCH PROTOCOL: No  ASSESSMENT: 58 y.o. [place], [place] woman  (1) history of left-sided ductal carcinoma in situ 2004  (a) s/p left  mastectomy with TRAM reconstruction  (b) status post tamoxifen x3 years  (2) left breast upper outer quadrant biopsy 08/30/2018 shows a clinical T1c N0 invasive ductal carcinoma, grade 3, estrogen receptor strongly  positive, progesterone receptor negative, with HER-2 amplification, and and MIB-1 of 15%.   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    (b) chemotherapy discontinued after 4 cycles because of intercurrent eye surgery  (c) continued trastuzumab  and pertuzumab  to complete a year (last dose 09/06/2019)  (d) echocardiogram on 01/23/2019 that shows well preserved EF of 60-65%  (e) echocardiogram on 04/23/2019 shows EF of 60-65%  (f) echocardiogram 08/02/2019 shows an ejection fraction in the 60-65% range  (4) left lumpectomy 02/20/2019 showed a residual  ypT1c NX invasive ductal carcinoma, grade 2 with negative margins.    (5) adjuvant radiation: Radiation Treatment Dates: 04/12/2019 through 05/28/2019 Site Technique Total Dose (Gy) Dose per Fx (Gy) Completed Fx Beam Energies  Breast: CW_Lt 3D 50.4/50.4 1.8 28/28 6X, 10X  Breast: CW_Lt_SCV_PAB 3D 50.4/50.4 1.8 28/28 6X, 10X  Breast: CW_Lt_Bst Electron 10/10 2 5/5 6X, 10X   (6) anastrozole  started 06/26/2019  (a) DEXA scan at [facility] 12/24/2015 found a T score of -1.6 DEXA scan from mass June 2023 showed T score of -1.4 at L1-L2  (7) genetics testing 02/03/2019 through the Common Hereditary Cancers Panel offered by [facility] found no deleterious mutations in APC, ATM, AXIN2, BARD1, BMPR1A, BRCA1, BRCA2, BRIP1, CDH1, CDKN2A (p14ARF), CDKN2A (p16INK4a), CKD4, CHEK2, CTNNA1, DICER1, EPCAM (Deletion/duplication testing only), GREM1 (promoter region deletion/duplication testing only), KIT, MEN1, MLH1, MSH2, MSH3, MSH6, MUTYH, NBN, NF1, NHTL1, PALB2, PDGFRA, PMS2, POLD1, POLE, PTEN, RAD50, RAD51C, RAD51D, RNF43, SDHB, SDHC, SDHD, SMAD4,  SMARCA4. STK11, TP53, TSC1, TSC2, and VHL.  The following genes were evaluated for sequence changes only: SDHA and HOXB13 c.251G>A variant only.  (8) She had a CXR for unintentional weight loss,  this showed possible left lung pleural based mass and posterior right eighth rib met lesion. CT showed 3.8 x 5.5 x 5 cm left anterior chest wall mass along the mid axillary line with rib involvement and destruction and low-attenuation changes of the center indicating necrotic mass correlates with a neoplastic lesion likely metastatic disease. Liver is enlarged, inhomogeneous with multiple enhancing lesions throughout the right and left lobe of the liver consistent with extensive numerous metastatic liver lesions. Liver appears completely replaced by metastatic disease. Lytic metastatic bone lesions involving the right anterior superior iliac crest, left iliac bone, right inferior pubic ramus, left iliac bone, and L4 vertebral body. Suggestive of lytic bone changes involving the upper thoracic vertebral bodies T1-T2.  PLAN:  Assessment & Plan Metastatic Breast Cancer HER2 positive and estrogen receptor positive breast cancer confirmed.  Assessment & Plan  Assessment and Plan Assessment & Plan Metastatic breast on docetaxel  with HP Undergoing chemotherapy with stable to likely improving CT scans. No new side effects reported. Pain controlled. Blood work satisfactory, pending kidney function results. - Reschedule chemotherapy and port flush to next Monday. - Repeat lab work if chemotherapy is delayed by a week. - Call with kidney function results when available.  Anxiety disorder Anxiety improved with as-needed medication.  Amber Stalls, MD  Medical Oncology and Hematology [facility] [address] [place], [place] [address] Tel. [phone]    Fax. [phone]  *Total Encounter Time as defined by the Centers for Medicare and Medicaid Services includes, in addition to the  face-to-face time of a patient visit (documented in the note above) non-face-to-face time: obtaining and reviewing outside history, ordering and reviewing medications, tests or procedures, care coordination (communications with other health care professionals or caregivers) and documentation in the medical record.

## 2024-03-07 ENCOUNTER — Inpatient Hospital Stay

## 2024-03-12 ENCOUNTER — Other Ambulatory Visit: Payer: Self-pay

## 2024-03-12 DIAGNOSIS — C50412 Malignant neoplasm of upper-outer quadrant of left female breast: Secondary | ICD-10-CM

## 2024-03-13 ENCOUNTER — Inpatient Hospital Stay

## 2024-03-13 ENCOUNTER — Encounter: Payer: Self-pay | Admitting: Hematology and Oncology

## 2024-03-13 ENCOUNTER — Other Ambulatory Visit: Payer: Self-pay | Admitting: Hematology and Oncology

## 2024-03-13 VITALS — BP 91/52 | HR 63 | Temp 98.2°F | Resp 16 | Ht 67.0 in | Wt 283.8 lb

## 2024-03-13 DIAGNOSIS — C7951 Secondary malignant neoplasm of bone: Secondary | ICD-10-CM

## 2024-03-13 DIAGNOSIS — C50412 Malignant neoplasm of upper-outer quadrant of left female breast: Secondary | ICD-10-CM | POA: Diagnosis not present

## 2024-03-13 DIAGNOSIS — C50912 Malignant neoplasm of unspecified site of left female breast: Secondary | ICD-10-CM

## 2024-03-13 DIAGNOSIS — Z170 Estrogen receptor positive status [ER+]: Secondary | ICD-10-CM | POA: Diagnosis not present

## 2024-03-13 DIAGNOSIS — C787 Secondary malignant neoplasm of liver and intrahepatic bile duct: Secondary | ICD-10-CM | POA: Diagnosis not present

## 2024-03-13 DIAGNOSIS — Z79811 Long term (current) use of aromatase inhibitors: Secondary | ICD-10-CM | POA: Diagnosis not present

## 2024-03-13 DIAGNOSIS — Z9013 Acquired absence of bilateral breasts and nipples: Secondary | ICD-10-CM | POA: Diagnosis not present

## 2024-03-13 DIAGNOSIS — Z79899 Other long term (current) drug therapy: Secondary | ICD-10-CM | POA: Diagnosis not present

## 2024-03-13 DIAGNOSIS — Z5111 Encounter for antineoplastic chemotherapy: Secondary | ICD-10-CM | POA: Diagnosis not present

## 2024-03-13 LAB — CBC WITH DIFFERENTIAL (CANCER CENTER ONLY)
Abs Immature Granulocytes: 0.06 K/uL (ref 0.00–0.07)
Basophils Absolute: 0 K/uL (ref 0.0–0.1)
Basophils Relative: 0 %
Eosinophils Absolute: 0 K/uL (ref 0.0–0.5)
Eosinophils Relative: 0 %
HCT: 31.4 % — ABNORMAL LOW (ref 36.0–46.0)
Hemoglobin: 10.7 g/dL — ABNORMAL LOW (ref 12.0–15.0)
Immature Granulocytes: 1 %
Lymphocytes Relative: 13 %
Lymphs Abs: 0.7 K/uL (ref 0.7–4.0)
MCH: 32.9 pg (ref 26.0–34.0)
MCHC: 34.1 g/dL (ref 30.0–36.0)
MCV: 96.6 fL (ref 80.0–100.0)
Monocytes Absolute: 0.6 K/uL (ref 0.1–1.0)
Monocytes Relative: 12 %
Neutro Abs: 3.7 K/uL (ref 1.7–7.7)
Neutrophils Relative %: 74 %
Platelet Count: 260 K/uL (ref 150–400)
RBC: 3.25 MIL/uL — ABNORMAL LOW (ref 3.87–5.11)
RDW: 15.3 % (ref 11.5–15.5)
WBC Count: 5.1 K/uL (ref 4.0–10.5)
nRBC: 0 % (ref 0.0–0.2)

## 2024-03-13 LAB — CMP (CANCER CENTER ONLY)
ALT: 18 U/L (ref 0–44)
AST: 20 U/L (ref 15–41)
Albumin: 3.9 g/dL (ref 3.5–5.0)
Alkaline Phosphatase: 107 U/L (ref 38–126)
Anion gap: 4 — ABNORMAL LOW (ref 5–15)
BUN: 12 mg/dL (ref 6–20)
CO2: 27 mmol/L (ref 22–32)
Calcium: 9.1 mg/dL (ref 8.9–10.3)
Chloride: 106 mmol/L (ref 98–111)
Creatinine: 0.6 mg/dL (ref 0.44–1.00)
GFR, Estimated: >60 mL/min (ref >60)
Glucose, Bld: 101 mg/dL — ABNORMAL HIGH (ref 70–99)
Potassium: 4.4 mmol/L (ref 3.5–5.1)
Sodium: 137 mmol/L (ref 135–145)
Total Bilirubin: 0.2 mg/dL (ref 0.0–1.2)
Total Protein: 6.5 g/dL (ref 6.5–8.1)

## 2024-03-13 MED ORDER — SODIUM CHLORIDE 0.9 % IV SOLN
420.0000 mg | Freq: Once | INTRAVENOUS | Status: AC
  Administered 2024-03-13: 420 mg via INTRAVENOUS
  Filled 2024-03-13: qty 14

## 2024-03-13 MED ORDER — DIPHENHYDRAMINE HCL 25 MG PO CAPS
50.0000 mg | ORAL_CAPSULE | Freq: Once | ORAL | Status: AC
  Administered 2024-03-13: 50 mg via ORAL
  Filled 2024-03-13: qty 2

## 2024-03-13 MED ORDER — ZOLEDRONIC ACID 4 MG/100ML IV SOLN
4.0000 mg | Freq: Once | INTRAVENOUS | Status: AC
  Administered 2024-03-13: 4 mg via INTRAVENOUS
  Filled 2024-03-13: qty 100

## 2024-03-13 MED ORDER — SODIUM CHLORIDE 0.9% FLUSH
10.0000 mL | Freq: Once | INTRAVENOUS | Status: AC
Start: 2024-03-13 — End: 2024-03-13
  Administered 2024-03-13: 10 mL

## 2024-03-13 MED ORDER — DEXAMETHASONE SODIUM PHOSPHATE 10 MG/ML IJ SOLN
10.0000 mg | Freq: Once | INTRAMUSCULAR | Status: AC
  Administered 2024-03-13: 10 mg via INTRAVENOUS
  Filled 2024-03-13: qty 1

## 2024-03-13 MED ORDER — SODIUM CHLORIDE 0.9 % IV SOLN
INTRAVENOUS | Status: DC
Start: 2024-03-13 — End: 2024-03-13

## 2024-03-13 MED ORDER — TRASTUZUMAB-ANNS CHEMO 150 MG IV SOLR
6.0000 mg/kg | Freq: Once | INTRAVENOUS | Status: AC
  Administered 2024-03-13: 336 mg via INTRAVENOUS
  Filled 2024-03-13: qty 16

## 2024-03-13 MED ORDER — ACETAMINOPHEN 325 MG PO TABS
650.0000 mg | ORAL_TABLET | Freq: Once | ORAL | Status: AC
  Administered 2024-03-13: 650 mg via ORAL
  Filled 2024-03-13: qty 2

## 2024-03-13 MED ORDER — SODIUM CHLORIDE 0.9 % IV SOLN
60.0000 mg/m2 | Freq: Once | INTRAVENOUS | Status: AC
  Administered 2024-03-13: 97 mg via INTRAVENOUS
  Filled 2024-03-13: qty 9.7

## 2024-03-13 NOTE — Patient Instructions (Signed)
 [facility] - A DEPT OF [facility]  Discharge Instructions: Thank you for choosing [facility] to provide your oncology and hematology care.   If you have a lab appointment with the Cancer Center, please go directly to the Cancer Center and check in at the registration area.   Wear comfortable clothing and clothing appropriate for easy access to any Portacath or PICC line.   We strive to give you quality time with your provider. You may need to reschedule your appointment if you arrive late (15 or more minutes).  Arriving late affects you and other patients whose appointments are after yours.  Also, if you miss three or more appointments without notifying the office, you may be dismissed from the clinic at the provider's discretion.      For prescription refill requests, have your pharmacy contact our office and allow 72 hours for refills to be completed.    Today you received the following chemotherapy and/or immunotherapy agents: Trastuzumab , pertuzumab , and docetaxel       To help prevent nausea and vomiting after your treatment, we encourage you to take your nausea medication as directed.  BELOW ARE SYMPTOMS THAT SHOULD BE REPORTED IMMEDIATELY: *FEVER GREATER THAN 100.4 F (38 C) OR HIGHER *CHILLS OR SWEATING *NAUSEA AND VOMITING THAT IS NOT CONTROLLED WITH YOUR NAUSEA MEDICATION *UNUSUAL SHORTNESS OF BREATH *UNUSUAL BRUISING OR BLEEDING *URINARY PROBLEMS (pain or burning when urinating, or frequent urination) *BOWEL PROBLEMS (unusual diarrhea, constipation, pain near the anus) TENDERNESS IN MOUTH AND THROAT WITH OR WITHOUT PRESENCE OF ULCERS (sore throat, sores in mouth, or a toothache) UNUSUAL RASH, SWELLING OR PAIN  UNUSUAL VAGINAL DISCHARGE OR ITCHING   Items with * indicate a potential emergency and should be followed up as soon as possible or go to the Emergency Department if any problems should occur.  Please show the CHEMOTHERAPY  ALERT CARD or IMMUNOTHERAPY ALERT CARD at check-in to the Emergency Department and triage nurse.  Should you have questions after your visit or need to cancel or reschedule your appointment, please contact [facility] - A DEPT OF [facility]  Dept: [phone]  and follow the prompts.  Office hours are 8:00 a.m. to 4:30 p.m. Monday - Friday. Please note that voicemails left after 4:00 p.m. may not be returned until the following business day.  We are closed weekends and major holidays. You have access to a nurse at all times for urgent questions. Please call the main number to the clinic Dept: [phone] and follow the prompts.   For any non-urgent questions, you may also contact your provider using MyChart. We now offer e-Visits for anyone 66 and older to request care online for non-urgent symptoms. For details visit [email or web address].   Also download the MyChart app! Go to the app store, search "MyChart", open the app, select [facility], and log in with your MyChart username and password.

## 2024-03-27 ENCOUNTER — Ambulatory Visit: Admitting: Hematology and Oncology

## 2024-03-27 ENCOUNTER — Ambulatory Visit

## 2024-03-27 ENCOUNTER — Other Ambulatory Visit

## 2024-03-28 ENCOUNTER — Ambulatory Visit

## 2024-04-09 ENCOUNTER — Ambulatory Visit (HOSPITAL_COMMUNITY)
Admission: RE | Admit: 2024-04-09 | Discharge: 2024-04-09 | Disposition: A | Discharge status: Home / Self Care | Source: Ambulatory Visit | Attending: Adult Health | Admitting: Adult Health

## 2024-04-09 ENCOUNTER — Other Ambulatory Visit: Payer: Self-pay | Admitting: Adult Health

## 2024-04-09 ENCOUNTER — Encounter: Payer: Self-pay | Admitting: Adult Health

## 2024-04-09 ENCOUNTER — Inpatient Hospital Stay: Attending: Hematology and Oncology

## 2024-04-09 ENCOUNTER — Inpatient Hospital Stay

## 2024-04-09 ENCOUNTER — Inpatient Hospital Stay (HOSPITAL_COMMUNITY): Admission: RE | Admit: 2024-04-09 | Source: Ambulatory Visit

## 2024-04-09 ENCOUNTER — Inpatient Hospital Stay (HOSPITAL_BASED_OUTPATIENT_CLINIC_OR_DEPARTMENT_OTHER): Admitting: Adult Health

## 2024-04-09 ENCOUNTER — Ambulatory Visit: Admitting: Emergency Medicine

## 2024-04-09 VITALS — BP 118/75 | HR 67 | Temp 98.2°F | Resp 16 | Ht 64.0 in | Wt 118.7 lb

## 2024-04-09 DIAGNOSIS — C50412 Malignant neoplasm of upper-outer quadrant of left female breast: Secondary | ICD-10-CM

## 2024-04-09 DIAGNOSIS — Z79899 Other long term (current) drug therapy: Secondary | ICD-10-CM | POA: Insufficient documentation

## 2024-04-09 DIAGNOSIS — M25511 Pain in right shoulder: Secondary | ICD-10-CM | POA: Insufficient documentation

## 2024-04-09 DIAGNOSIS — C7951 Secondary malignant neoplasm of bone: Secondary | ICD-10-CM

## 2024-04-09 DIAGNOSIS — C7800 Secondary malignant neoplasm of unspecified lung: Secondary | ICD-10-CM | POA: Insufficient documentation

## 2024-04-09 DIAGNOSIS — M19011 Primary osteoarthritis, right shoulder: Secondary | ICD-10-CM | POA: Diagnosis not present

## 2024-04-09 DIAGNOSIS — C50912 Malignant neoplasm of unspecified site of left female breast: Secondary | ICD-10-CM

## 2024-04-09 DIAGNOSIS — Z5111 Encounter for antineoplastic chemotherapy: Secondary | ICD-10-CM | POA: Diagnosis not present

## 2024-04-09 DIAGNOSIS — Z09 Encounter for follow-up examination after completed treatment for conditions other than malignant neoplasm: Secondary | ICD-10-CM

## 2024-04-09 DIAGNOSIS — Z170 Estrogen receptor positive status [ER+]: Secondary | ICD-10-CM | POA: Diagnosis not present

## 2024-04-09 DIAGNOSIS — C787 Secondary malignant neoplasm of liver and intrahepatic bile duct: Secondary | ICD-10-CM | POA: Insufficient documentation

## 2024-04-09 DIAGNOSIS — C50919 Malignant neoplasm of unspecified site of unspecified female breast: Secondary | ICD-10-CM | POA: Diagnosis not present

## 2024-04-09 DIAGNOSIS — S2241XD Multiple fractures of ribs, right side, subsequent encounter for fracture with routine healing: Secondary | ICD-10-CM | POA: Diagnosis not present

## 2024-04-09 DIAGNOSIS — Z9013 Acquired absence of bilateral breasts and nipples: Secondary | ICD-10-CM | POA: Insufficient documentation

## 2024-04-09 LAB — CMP (CANCER CENTER ONLY)
ALT: 18 U/L (ref 0–44)
AST: 19 U/L (ref 15–41)
Albumin: 4 g/dL (ref 3.5–5.0)
Alkaline Phosphatase: 92 U/L (ref 38–126)
Anion gap: 6 (ref 5–15)
BUN: 14 mg/dL (ref 6–20)
CO2: 24 mmol/L (ref 22–32)
Calcium: 9 mg/dL (ref 8.9–10.3)
Chloride: 106 mmol/L (ref 98–111)
Creatinine: 0.67 mg/dL (ref 0.44–1.00)
GFR, Estimated: >60 mL/min (ref >60)
Glucose, Bld: 149 mg/dL — ABNORMAL HIGH (ref 70–99)
Potassium: 3.8 mmol/L (ref 3.5–5.1)
Sodium: 136 mmol/L (ref 135–145)
Total Bilirubin: 0.4 mg/dL (ref 0.0–1.2)
Total Protein: 7 g/dL (ref 6.5–8.1)

## 2024-04-09 LAB — CBC WITH DIFFERENTIAL (CANCER CENTER ONLY)
Abs Immature Granulocytes: 0.01 K/uL (ref 0.00–0.07)
Basophils Absolute: 0 K/uL (ref 0.0–0.1)
Basophils Relative: 0 %
Eosinophils Absolute: 0 K/uL (ref 0.0–0.5)
Eosinophils Relative: 0 %
HCT: 30.4 % — ABNORMAL LOW (ref 36.0–46.0)
Hemoglobin: 10.5 g/dL — ABNORMAL LOW (ref 12.0–15.0)
Immature Granulocytes: 0 %
Lymphocytes Relative: 17 %
Lymphs Abs: 0.9 K/uL (ref 0.7–4.0)
MCH: 32.9 pg (ref 26.0–34.0)
MCHC: 34.5 g/dL (ref 30.0–36.0)
MCV: 95.3 fL (ref 80.0–100.0)
Monocytes Absolute: 0.5 K/uL (ref 0.1–1.0)
Monocytes Relative: 10 %
Neutro Abs: 4 K/uL (ref 1.7–7.7)
Neutrophils Relative %: 73 %
Platelet Count: 213 K/uL (ref 150–400)
RBC: 3.19 MIL/uL — ABNORMAL LOW (ref 3.87–5.11)
RDW: 14.3 % (ref 11.5–15.5)
WBC Count: 5.4 K/uL (ref 4.0–10.5)
nRBC: 0 % (ref 0.0–0.2)

## 2024-04-09 MED ORDER — SODIUM CHLORIDE 0.9 % IV SOLN
420.0000 mg | Freq: Once | INTRAVENOUS | Status: AC
  Administered 2024-04-09: 420 mg via INTRAVENOUS
  Filled 2024-04-09: qty 14

## 2024-04-09 MED ORDER — DEXAMETHASONE SODIUM PHOSPHATE 10 MG/ML IJ SOLN
10.0000 mg | Freq: Once | INTRAMUSCULAR | Status: AC
  Administered 2024-04-09: 10 mg via INTRAVENOUS
  Filled 2024-04-09: qty 1

## 2024-04-09 MED ORDER — OMEPRAZOLE 20 MG PO CPDR: 20.0000 mg | DELAYED_RELEASE_CAPSULE | Freq: Every day | ORAL | 1 refills | Status: AC

## 2024-04-09 MED ORDER — TRASTUZUMAB-ANNS CHEMO 150 MG IV SOLR
6.0000 mg/kg | Freq: Once | INTRAVENOUS | Status: AC
  Administered 2024-04-09: 336 mg via INTRAVENOUS
  Filled 2024-04-09: qty 16

## 2024-04-09 MED ORDER — MELOXICAM 7.5 MG PO TABS: 7.5000 mg | ORAL_TABLET | Freq: Every day | ORAL | 0 refills | Status: AC

## 2024-04-09 MED ORDER — SODIUM CHLORIDE 0.9 % IV SOLN
60.0000 mg/m2 | Freq: Once | INTRAVENOUS | Status: AC
  Administered 2024-04-09: 97 mg via INTRAVENOUS
  Filled 2024-04-09: qty 9.7

## 2024-04-09 MED ORDER — SODIUM CHLORIDE 0.9 % IV SOLN: INTRAVENOUS | Status: DC

## 2024-04-09 MED ORDER — DIPHENHYDRAMINE HCL 25 MG PO CAPS
50.0000 mg | ORAL_CAPSULE | Freq: Once | ORAL | Status: AC
  Administered 2024-04-09: 50 mg via ORAL
  Filled 2024-04-09: qty 2

## 2024-04-09 MED ORDER — ACETAMINOPHEN 325 MG PO TABS
650.0000 mg | ORAL_TABLET | Freq: Once | ORAL | Status: AC
  Administered 2024-04-09: 650 mg via ORAL
  Filled 2024-04-09: qty 2

## 2024-04-09 NOTE — Patient Instructions (Signed)
 [facility] - A DEPT OF [facility]  Discharge Instructions: Thank you for choosing [facility] to provide your oncology and hematology care.   If you have a lab appointment with the Cancer Center, please go directly to the Cancer Center and check in at the registration area.   Wear comfortable clothing and clothing appropriate for easy access to any Portacath or PICC line.   We strive to give you quality time with your provider. You may need to reschedule your appointment if you arrive late (15 or more minutes).  Arriving late affects you and other patients whose appointments are after yours.  Also, if you miss three or more appointments without notifying the office, you may be dismissed from the clinic at the provider's discretion.      For prescription refill requests, have your pharmacy contact our office and allow 72 hours for refills to be completed.    Today you received the following chemotherapy and/or immunotherapy agents: Trastuzumab , pertuzumab , and docetaxel       To help prevent nausea and vomiting after your treatment, we encourage you to take your nausea medication as directed.  BELOW ARE SYMPTOMS THAT SHOULD BE REPORTED IMMEDIATELY: *FEVER GREATER THAN 100.4 F (38 C) OR HIGHER *CHILLS OR SWEATING *NAUSEA AND VOMITING THAT IS NOT CONTROLLED WITH YOUR NAUSEA MEDICATION *UNUSUAL SHORTNESS OF BREATH *UNUSUAL BRUISING OR BLEEDING *URINARY PROBLEMS (pain or burning when urinating, or frequent urination) *BOWEL PROBLEMS (unusual diarrhea, constipation, pain near the anus) TENDERNESS IN MOUTH AND THROAT WITH OR WITHOUT PRESENCE OF ULCERS (sore throat, sores in mouth, or a toothache) UNUSUAL RASH, SWELLING OR PAIN  UNUSUAL VAGINAL DISCHARGE OR ITCHING   Items with * indicate a potential emergency and should be followed up as soon as possible or go to the Emergency Department if any problems should occur.  Please show the CHEMOTHERAPY  ALERT CARD or IMMUNOTHERAPY ALERT CARD at check-in to the Emergency Department and triage nurse.  Should you have questions after your visit or need to cancel or reschedule your appointment, please contact [facility] - A DEPT OF [facility]  Dept: [phone]  and follow the prompts.  Office hours are 8:00 a.m. to 4:30 p.m. Monday - Friday. Please note that voicemails left after 4:00 p.m. may not be returned until the following business day.  We are closed weekends and major holidays. You have access to a nurse at all times for urgent questions. Please call the main number to the clinic Dept: [phone] and follow the prompts.   For any non-urgent questions, you may also contact your provider using MyChart. We now offer e-Visits for anyone 66 and older to request care online for non-urgent symptoms. For details visit [email or web address].   Also download the MyChart app! Go to the app store, search "MyChart", open the app, select [facility], and log in with your MyChart username and password.

## 2024-04-09 NOTE — Progress Notes (Signed)
 [facility] Cancer Follow up:    Paige Foster, Paige Rockford, NP [address] [place] [address]   DIAGNOSIS: Cancer Staging  Malignant neoplasm of upper-outer quadrant of left breast in female, estrogen receptor positive (HCC) Staging form: Breast, AJCC 8th Edition - Clinical stage from 08/30/2018: Stage IA (cT1c, cN0, cM0, G3, ER+, PR-, HER2+) - Signed by Crawford Morna Pickle, NP on 01/31/2019 Histologic grading system: 3 grade system  Recurrent breast cancer, left (HCC) Staging form: Breast, AJCC 8th Edition - Clinical: Stage IIA (rcT2, rcN0, rcM0, G3, ER+, PR-, HER2+) - Unsigned Stage prefix: Recurrence Neoadjuvant therapy: No Histologic grading system: 3 grade system    SUMMARY OF ONCOLOGIC HISTORY: Oncology History  Malignant neoplasm of upper-outer quadrant of left breast in female, estrogen receptor positive (HCC)  08/30/2018 Cancer Staging   Staging form: Breast, AJCC 8th Edition - Clinical stage from 08/30/2018: Stage IA (cT1c, cN0, cM0, G3, ER+, PR-, HER2+) - Signed by Crawford Morna Pickle, NP on 01/31/2019   09/06/2018 Initial Diagnosis   Malignant neoplasm of upper-outer quadrant of left breast in female, estrogen receptor positive (HCC)   01/12/2019 - 09/06/2019 Chemotherapy   Patient is on Treatment Plan : BREAST Herceptin  / Pertuzumab  (Perjeta ) q21d      Genetic Testing   Negative testing. No pathogenic variants identified on the Common Hereditary Cancers Panel. The Common Hereditary Cancers Panel offered by [facility] includes sequencing and/or deletion duplication testing of the following 48 genes: APC, ATM, AXIN2, BARD1, BMPR1A, BRCA1, BRCA2, BRIP1, CDH1, CDKN2A (p14ARF), CDKN2A (p16INK4a), CKD4, CHEK2, CTNNA1, DICER1, EPCAM (Deletion/duplication testing only), GREM1 (promoter region deletion/duplication testing only), KIT, MEN1, MLH1, MSH2, MSH3, MSH6, MUTYH, NBN, NF1, NHTL1, PALB2, PDGFRA, PMS2, POLD1, POLE, PTEN, RAD50, RAD51C, RAD51D, RNF43,  SDHB, SDHC, SDHD, SMAD4, SMARCA4. STK11, TP53, TSC1, TSC2, and VHL.  The following genes were evaluated for sequence changes only: SDHA and HOXB13 c.251G>A variant only. The report date is 02/03/2019.   12/02/2023 -  Chemotherapy   Patient is on Treatment Plan : BREAST DOCEtaxel  + Trastuzumab  + Pertuzumab  (THP) q21d x 8 cycles / Trastuzumab  + Pertuzumab  q21d x 4 cycles     Recurrent breast cancer, left (HCC)  12/02/2023 -  Chemotherapy   Patient is on Treatment Plan : BREAST DOCEtaxel  + Trastuzumab  + Pertuzumab  (THP) q21d x 8 cycles / Trastuzumab  + Pertuzumab  q21d x 4 cycles       CURRENT THERAPY: THP  INTERVAL HISTORY:  Discussed the use of AI scribe software for clinical note transcription with the patient, who gave verbal consent to proceed.  History of Present Illness Paige Foster is a 58 year old female with stage IIA left breast cancer with metastasis who presents with shoulder and back pain.  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 Her current treatment includes Taxotere , Herceptin , and Perjeta , initiated in May 2025.  She experiences severe right shoulder and back pain, initially starting in the shoulder, improving slightly, but worsening in the middle of her back. The pain is exacerbated by repetitive activities and cold exposure, and she has difficulty lifting her arm.  She has previously used meloxicam  for pain management which helped. She denies fevers, chills, cough, shortness of breath, numbness, or tingling in extremities. No urinary issues.    Patient Active Problem List   Diagnosis Date Noted   Demand ischemia (HCC) 12/17/2023   Chronic diastolic heart failure (HCC) 12/16/2023   NSTEMI (non-ST elevated myocardial infarction) (HCC)  12/15/2023   Hypokalemia 12/15/2023   Metastasis to bone (HCC) 10/31/2023   Metastasis to liver (HCC) 10/31/2023   Mass of lower lobe of left lung 10/18/2023   Elevated LFTs  10/18/2023   Degeneration of intervertebral disc of lumbar region with discogenic back pain and lower extremity pain 07/26/2023   Primary osteoarthritis of right hip 07/26/2023   Pain of right hip 07/26/2023   Bulging eyes 07/04/2023   Use of anastrozole  (Arimidex ) 07/04/2023   Pure hypercholesterolemia 11/17/2021   Anemia 11/17/2021   Osteopenia 08/16/2019   Genetic testing 02/05/2019   Recurrent breast cancer, left (HCC) 09/07/2018   Malignant neoplasm of upper-outer quadrant of left breast in female, estrogen receptor positive (HCC) 09/06/2018   HTN (hypertension) 07/10/2013   HEMORRHOIDS-INTERNAL 04/23/2009   History of colonic polyps 04/23/2009   HEMORRHOID, THROMBOSED 03/12/2009    is allergic to bee pollen.  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    Port-A-Cath in place 09/28/2018   Recurrent breast cancer, left [facility]) oncologist-- dr layla    dx 2004, noninvasive Stage 0 ----s/p left mastectomy w/ tram flap construction (and right breast reduction), taken Tamoxifen for 3 yrs;   08-30-2018 recurrent left cancer , Grade III,  cT1c,  ER positive, PR negative, HER-2 positive, invasive ductal carcinoma-- neoadjuvant chemo to start 09-26-2018   Renal artery stenosis (HCC)    mild right external renal artery stenosis per duplex in epic 08-09-2013   Wears glasses     SURGICAL HISTORY: Past Surgical History:  Procedure Laterality Date   BREAST LUMPECTOMY WITH RADIOACTIVE SEED LOCALIZATION Left 02/20/2019   Procedure: LEFT BREAST LUMPECTOMY WITH RADIOACTIVE SEED LOCALIZATION;  Surgeon: Ebbie Cough, MD;  Location: [facility] OR;  Service: General;  Laterality: Left;   BREAST SURGERY Left    Transflap   COLONOSCOPY     COLONOSCOPY     IR IMAGING GUIDED PORT INSERTION  11/25/2023   LEFT HEART  CATH AND CORONARY ANGIOGRAPHY N/A 12/16/2023   Procedure: LEFT HEART CATH AND CORONARY ANGIOGRAPHY;  Surgeon: Swaziland, Peter M, MD;  Location: [facility] INVASIVE CV LAB;  Service: Cardiovascular;  Laterality: N/A;   MASTECTOMY Left 2004   w/  TRAM flap construction and right breast augmentation with abdominoplasy   PARS PLANA VITRECTOMY Left 12/18/2018   Procedure: PARS PLANA VITRECTOMY WITH 25 GAUGE, ENDOLASER;  Surgeon: Tobie Baptist, MD;  Location: [facility] OR;  Service: Ophthalmology;  Laterality: Left;   PORTACATH PLACEMENT N/A 09/25/2018   Procedure: INSERTION PORT-A-CATH WITH ULTRASOUND;  Surgeon: Ebbie Cough, MD;  Location: [facility] ORS;  Service: General;  Laterality: N/A;   TUBAL LIGATION Bilateral yrs ago    SOCIAL HISTORY: Social History   Socioeconomic History   Marital status: Married    Spouse name: Not on file   Number of children: 2   Years of education: Not on file   Highest education level: Not on file  Occupational History   Occupation: [employer]  Tobacco Use   Smoking status: Never    Passive exposure: Never   Smokeless tobacco: Never  Vaping Use   Vaping status: Never Used  Substance and Sexual Activity   Alcohol use: Not Currently    Alcohol/week: 0.0 standard drinks of alcohol    Comment: Occassionally   Drug  use: No   Sexual activity: Not on file  Other Topics Concern   Not on file  Social History Narrative   Not on file   Social Drivers of Health   Financial Resource Strain: Low Risk  (10/27/2022)   Overall Financial Resource Strain (CARDIA)    Difficulty of Paying Living Expenses: Not hard at all  Food Insecurity: No Food Insecurity (12/15/2023)   Hunger Vital Sign    Worried About Running Out of Food in the Last Year: Never true    Ran Out of Food in the Last Year: Never true  Transportation Needs: No Transportation Needs (12/15/2023)   PRAPARE - Administrator, Civil Service (Medical): No    Lack of Transportation (Non-Medical): No  Physical  Activity: Insufficiently Active (10/27/2022)   Exercise Vital Sign    Days of Exercise per Week: 7 days    Minutes of Exercise per Session: 20 min  Stress: No Stress Concern Present (10/27/2022)   [employer] of Occupational Health - Occupational Stress Questionnaire    Feeling of Stress : Not at all  Social Connections: Moderately Isolated (10/27/2022)   Social Connection and Isolation Panel    Frequency of Communication with Friends and Family: More than three times a week    Frequency of Social Gatherings with Friends and Family: More than three times a week    Attends Religious Services: Never    Database administrator or Organizations: No    Attends Banker Meetings: Never    Marital Status: Married  Catering manager Violence: Not At Risk (12/15/2023)   Humiliation, Afraid, Rape, and Kick questionnaire    Fear of Current or Ex-Partner: No    Emotionally Abused: No    Physically Abused: No    Sexually Abused: No    FAMILY HISTORY: Family History  Problem Relation Age of Onset   Diabetes Mother    Hypertension Mother    Kidney disease Father    Colon cancer Neg Hx    Colon polyps Neg Hx    Gallbladder disease Neg Hx    Heart disease Neg Hx    Esophageal cancer Neg Hx    Stomach cancer Neg Hx    Rectal cancer Neg Hx     Review of Systems  Constitutional:  Negative for appetite change, chills, fatigue, fever and unexpected weight change.  HENT:   Negative for hearing loss, lump/mass and trouble swallowing.   Eyes:  Negative for eye problems and icterus.  Respiratory:  Negative for chest tightness, cough and shortness of breath.   Cardiovascular:  Negative for chest pain, leg swelling and palpitations.  Gastrointestinal:  Negative for abdominal distention, abdominal pain, constipation, diarrhea, nausea and vomiting.  Endocrine: Negative for hot flashes.  Genitourinary:  Negative for difficulty urinating.   Musculoskeletal:  Negative for arthralgias.  Skin:   Negative for itching and rash.  Neurological:  Negative for dizziness, extremity weakness, headaches and numbness.  Hematological:  Negative for adenopathy. Does not bruise/bleed easily.  Psychiatric/Behavioral:  Negative for depression. The patient is not nervous/anxious.       PHYSICAL EXAMINATION   Onc Performance Status - 04/09/24 0912       ECOG Perf Status   ECOG Perf Status Restricted in physically strenuous activity but ambulatory and able to carry out work of a light or sedentary nature, e.g., light house work, office work      KPS SCALE   KPS % SCORE Normal activity with effort,  some s/s of disease          Vitals:   04/09/24 0907  BP: 118/75  Pulse: 67  Resp: 16  Temp: 98.2 F (36.8 C)  SpO2: 100%    Physical Exam Constitutional:      General: She is not in acute distress.    Appearance: Normal appearance. She is not toxic-appearing.  HENT:     Head: Normocephalic and atraumatic.     Mouth/Throat:     Mouth: Mucous membranes are moist.     Pharynx: Oropharynx is clear. No oropharyngeal exudate or posterior oropharyngeal erythema.  Eyes:     General: No scleral icterus. Cardiovascular:     Rate and Rhythm: Normal rate and regular rhythm.     Pulses: Normal pulses.     Heart sounds: Normal heart sounds.  Pulmonary:     Effort: Pulmonary effort is normal.     Breath sounds: Normal breath sounds.  Abdominal:     General: Abdomen is flat. Bowel sounds are normal. There is no distension.     Palpations: Abdomen is soft.     Tenderness: There is no abdominal tenderness.  Musculoskeletal:        General: No swelling.     Cervical back: Neck supple.     Comments: Tenderness noted midway over right scapula and extending laterally to rotator cuff, +TTP throughout  Lymphadenopathy:     Cervical: No cervical adenopathy.  Skin:    General: Skin is warm and dry.     Findings: No rash.  Neurological:     General: No focal deficit present.     Mental Status:  She is alert.  Psychiatric:        Mood and Affect: Mood normal.        Behavior: Behavior normal.     LABORATORY DATA:  CBC    Component Value Date/Time   WBC 5.4 04/09/2024 0846   WBC 11.8 (H) 12/17/2023 0837   RBC 3.19 (L) 04/09/2024 0846   HGB 10.5 (L) 04/09/2024 0846   HGB 13.4 10/12/2017 0950   HGB 12.7 08/30/2007 1204   HCT 30.4 (L) 04/09/2024 0846   HCT 40.6 10/12/2017 0950   HCT 38.6 08/30/2007 1204   PLT 213 04/09/2024 0846   PLT 307 10/12/2017 0950   MCV 95.3 04/09/2024 0846   MCV 93 10/12/2017 0950   MCV 90.2 08/30/2007 1204   MCH 32.9 04/09/2024 0846   MCHC 34.5 04/09/2024 0846   RDW 14.3 04/09/2024 0846   RDW 13.4 10/12/2017 0950   RDW 12.4 08/30/2007 1204   LYMPHSABS 0.9 04/09/2024 0846   LYMPHSABS 2.2 08/30/2007 1204   MONOABS 0.5 04/09/2024 0846   MONOABS 0.4 08/30/2007 1204   EOSABS 0.0 04/09/2024 0846   EOSABS 0.1 08/30/2007 1204   BASOSABS 0.0 04/09/2024 0846   BASOSABS 0.0 08/30/2007 1204    CMP     Component Value Date/Time   NA 136 04/09/2024 0846   NA 140 10/12/2017 0950   K 3.8 04/09/2024 0846   CL 106 04/09/2024 0846   CO2 24 04/09/2024 0846   GLUCOSE 149 (H) 04/09/2024 0846   BUN 14 04/09/2024 0846   BUN 10 10/12/2017 0950   CREATININE 0.67 04/09/2024 0846   CALCIUM  9.0 04/09/2024 0846   PROT 7.0 04/09/2024 0846   PROT 7.5 10/12/2017 0950   ALBUMIN 4.0 04/09/2024 0846   ALBUMIN 4.3 10/12/2017 0950   AST 19 04/09/2024 0846   ALT 18 04/09/2024 0846  ALKPHOS 92 04/09/2024 0846   BILITOT 0.4 04/09/2024 0846   GFRNONAA >60 04/09/2024 0846   GFRAA >60 12/06/2019 1441   GFRAA >60 09/07/2018 1455     ASSESSMENT and THERAPY PLAN:   Assessment and Plan Assessment & Plan Metastatic left breast cancer (bone, liver, lung) Stage IV left breast cancer with metastasis to bone, liver, and lung. On Taxotere , Herceptin , and Perjeta  since May 2025. No systemic symptoms. Port functioning well. Bowel movements improved with diet. -  Continue Taxotere , Herceptin , and Perjeta ; cycle 6 today.  - Order echocardiogram, last echo occurred 11/2023 and normal, no cardiac symptoms today so ok to proceed with treatment.   Right shoulder and upper back pain Right shoulder and upper back pain, possibly musculoskeletal or metastatic. Imaging needed to rule out cancer involvement. - Order x-ray of the right shoulder and shoulder blade. - Meloxicam  prescribed as anti-inflammatory - Omeprazole  prescribed as ulcer prophylaxis - Advise to contact if no improvement in shoulder pain for consideration of stronger medication.  RTC in 3 weeks for labs, f/u, and next treatment.    All questions were answered. The patient knows to call the clinic with any problems, questions or concerns. We can certainly see the patient much sooner if necessary.  Total encounter time:30 minutes*in face-to-face visit time, chart review, lab review, care coordination, order entry, and documentation of the encounter time.    Morna Kendall, NP 04/09/24 9:51 AM Medical Oncology and Hematology [facility] [address] [place], [place] [address] Tel. [phone]    Fax. [phone]  *Total Encounter Time as defined by the Centers for Medicare and Medicaid Services includes, in addition to the face-to-face time of a patient visit (documented in the note above) non-face-to-face time: obtaining and reviewing outside history, ordering and reviewing medications, tests or procedures, care coordination (communications with other health care professionals or caregivers) and documentation in the medical record.

## 2024-04-09 NOTE — Progress Notes (Signed)
 Called patient to review imaging results from xray which indicate bone metastasis.  Images sent to Dr. Izell as patient pain is increasing in area to evaluate whether radiation is an option.  I reviewed with Dr. Loretha and will repeat restaging CT Chest/abd/pelvis.  I could not get patient on the phone, so I left a message asking her to call back so I can review the above with her.    Morna Kendall, NP 04/09/24 3:39 PM Medical Oncology and Hematology [facility] [address] [place], [place] [address] Tel. [phone]    Fax. [phone]

## 2024-04-10 ENCOUNTER — Ambulatory Visit

## 2024-04-10 ENCOUNTER — Telehealth: Payer: Self-pay | Admitting: Radiation Oncology

## 2024-04-10 NOTE — Telephone Encounter (Signed)
 9/16 @ 10:16 am Left voicemail for patient to call our office to be sch for follow up appt.

## 2024-04-11 ENCOUNTER — Other Ambulatory Visit: Payer: Self-pay

## 2024-04-11 ENCOUNTER — Ambulatory Visit (HOSPITAL_COMMUNITY)
Admission: RE | Admit: 2024-04-11 | Discharge: 2024-04-11 | Disposition: A | Discharge status: Home / Self Care | Source: Ambulatory Visit | Attending: Adult Health | Admitting: Adult Health

## 2024-04-11 ENCOUNTER — Inpatient Hospital Stay

## 2024-04-11 VITALS — BP 116/61 | HR 76 | Temp 98.4°F | Resp 16

## 2024-04-11 DIAGNOSIS — C50412 Malignant neoplasm of upper-outer quadrant of left female breast: Secondary | ICD-10-CM | POA: Insufficient documentation

## 2024-04-11 DIAGNOSIS — Z79899 Other long term (current) drug therapy: Secondary | ICD-10-CM | POA: Diagnosis not present

## 2024-04-11 DIAGNOSIS — C7951 Secondary malignant neoplasm of bone: Secondary | ICD-10-CM | POA: Diagnosis not present

## 2024-04-11 DIAGNOSIS — Z170 Estrogen receptor positive status [ER+]: Secondary | ICD-10-CM

## 2024-04-11 DIAGNOSIS — Z0189 Encounter for other specified special examinations: Secondary | ICD-10-CM

## 2024-04-11 DIAGNOSIS — Z7969 Long term (current) use of other immunomodulators and immunosuppressants: Secondary | ICD-10-CM | POA: Diagnosis not present

## 2024-04-11 DIAGNOSIS — Z09 Encounter for follow-up examination after completed treatment for conditions other than malignant neoplasm: Secondary | ICD-10-CM | POA: Diagnosis not present

## 2024-04-11 DIAGNOSIS — C50912 Malignant neoplasm of unspecified site of left female breast: Secondary | ICD-10-CM

## 2024-04-11 DIAGNOSIS — Z5111 Encounter for antineoplastic chemotherapy: Secondary | ICD-10-CM | POA: Diagnosis not present

## 2024-04-11 DIAGNOSIS — C787 Secondary malignant neoplasm of liver and intrahepatic bile duct: Secondary | ICD-10-CM | POA: Diagnosis not present

## 2024-04-11 DIAGNOSIS — Z9013 Acquired absence of bilateral breasts and nipples: Secondary | ICD-10-CM | POA: Diagnosis not present

## 2024-04-11 DIAGNOSIS — M25511 Pain in right shoulder: Secondary | ICD-10-CM | POA: Diagnosis not present

## 2024-04-11 LAB — ECHOCARDIOGRAM COMPLETE
Area-P 1/2: 3.43 cm2
Calc EF: 68.5 %
S' Lateral: 2.9 cm
Single Plane A2C EF: 72.2 %
Single Plane A4C EF: 66.7 %

## 2024-04-11 MED ORDER — PEGFILGRASTIM-CBQV 6 MG/0.6ML ~~LOC~~ SOSY
6.0000 mg | PREFILLED_SYRINGE | Freq: Once | SUBCUTANEOUS | Status: AC
  Administered 2024-04-11: 6 mg via SUBCUTANEOUS
  Filled 2024-04-11: qty 0.6

## 2024-04-11 NOTE — Progress Notes (Signed)
 Histology and Location of Primary Cancer:  Metastatic Breast Cancer (Bone, Liver and Lung)  Location(s) of Symptomatic tumor(s):  Shoulder and Back pain   Past/Anticipated chemotherapy by medical oncology, if any:    Patient's main complaints related to symptomatic tumor(s) are: ***  Pain on a scale of 0-10 is: ***    If Spine Met(s), symptoms, if any, include: Bowel/Bladder retention or incontinence (please describe): *** Numbness or weakness in extremities (please describe): *** Current Decadron  regimen, if applicable: ***  Ambulatory status? Walker? Wheelchair?: ***  SAFETY ISSUES: Prior radiation? *** Pacemaker/ICD? *** Possible current pregnancy? *** Is the patient on methotrexate? ***  Additional Complaints / other details:  ***

## 2024-04-12 NOTE — Progress Notes (Signed)
 Radiation Oncology         [phone] ________________________________  Outpatient Re-Consultation  Name: Paige Foster MRN: [id number]  Date: 04/13/2024  DOB: 09-01-1965  RR:Wryz, Roselie Rockford, NP  Loretha Ash, MD   REFERRING PHYSICIAN: Loretha Ash, MD  DIAGNOSIS: No diagnosis found.   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 Now with evidence of a new osseous metastatic lesion in the tip of the right scapula.   History of invasive ductal carcinoma of the left breast diagnosed in 2020, ER+ / PR- / Her2+ / Grade 3: s/p neoadjuvant chemotherapy, left breast lumpectomy, adjuvant radiation therapy, and anastrozole  x 5 years (due to complete anastrozole  this coming Nov 2025)    Initial diagnosis of left breast DCIS in 2004: s/p left mastectomy w/ TRAM reconstruction followed by Tamoxifen x3 years   CHIEF COMPLAINT: Here to discuss management of metastatic breast cancer  HISTORY OF PRESENT ILLNESS::Paige Foster is a 58 y.o. female who returns to us  today to discuss further radiation therapy in management of her recent progression of metastatic breast cancer, characterized by a new  osseous metastatic lesion in the tip of the right scapula. She was last seen here for follow-up by PA Muir on 01/10/24. She was doing well at that time and denied any pain related to her osseous disease. She also reported achieving resolution of her right shoulder pain s/p radiation therapy.   Since her last visit, she continued to receive chemotherapy consisting of Trastuzumab , pertuzumab , and docetaxel  under the care of Dr. Loretha. Following her 3rd cycle of chemotherapy, she presented for a restaging CT CAP with contrast on 02/01/24 that demonstrated stable osseous metastatic disease to the left iliac crest, right ileal mass, and of the various other small masses in the spine and pelvis, and stability of the left chest wall metastasis. Other notable findings included: developing central necrotic changes in the numerous hepatic metastases favoring an effective treatment response, and an overall increase in size of the liver.   Although she has tolerated her chemotherapy regimen well thus far, she recently presented to Dr. Loretha on 04/09/24 with severe right shoulder and back pain prior to receiving her 6th cycle of chemotherapy. She characterized her pain at that time as exacerbated by repetitive activities and cold exposure, and associated with limited ROM to her arm (with lifting her arm).   Given her symptoms, an x-ray of the right shoulder was obtained that day (09/15) which demonstrated evidence of a destructive permeative lesion in the tip of the right scapula, compatible with osseous metastatic disease. An x-ray of the right scapula was also obtained which showed concurrent findings with the addition of a lucent area in the scapula near the glenoid. The right scapular x-ray also showed healing  fractures with mixed density in the right lateral 7th and 9th ribs, possibly reflecting metastases with pathologic fractures.   Dr. Loretha has also prescribed her Meloxicam  to help with any inflammation related  to her right shoulder and back pain.   With regards to her systemic therapy, she will proceed with maintenance therapy consisting of HP.   PREVIOUS RADIATION THERAPY: Yes   2) Intent: Palliative  Radiation Treatment Dates: First Treatment Date: 2023-11-22 -- Last Treatment Date: 2023-11-29 Site/Dose/Technique/Mode:   Plan Name: Spine_L Site: Lumbar Spine Technique: 3D Mode: Photon Dose Per Fraction: 4 Gy Prescribed Dose (Delivered / Prescribed): 20 Gy / 20 Gy Prescribed Fxs (Delivered / Prescribed): 5 / 5   Plan Name: Ext_R_Shld Site: Humerus, Right Technique: 3D Mode: Photon Dose Per Fraction: 4 Gy Prescribed Dose (Delivered / Prescribed): 20 Gy / 20 Gy Prescribed Fxs (Delivered / Prescribed): 5 / 5  1) Diagnoses: C50.412-Malignant neoplasm of upper-outer quadrant of left female breast Cancer Staging:  Recurrent breast cancer, left (HCC) Staging form: Breast, AJCC 8th Edition - Clinical: Stage IIA (rcT2, cN0, cM0, G3, ER+, PR-, HER2+) - Unsigned  ypT1c, pNX Intent: Curative/salvage Radiation Treatment Dates: 04/12/2019 through 05/28/2019 Site Technique Total Dose (Gy) Dose per Fx (Gy) Completed Fx Beam Energies  Breast: CW_Lt 3D 50.4/50.4 1.8 28/28 6X, 10X  Breast: CW_Lt_SCV_PAB 3D 50.4/50.4 1.8 28/28 6X, 10X  Breast: CW_Lt_Bst Electron 10/10 2 5/5 6X, 10X     PAST MEDICAL HISTORY:  has a past medical history of Anemia, Breast cancer (HCC), History of blood transfusion (2004), History of colon polyps, Hypertension, Neuromuscular disorder (HCC), Port-A-Cath in place (09/28/2018), Recurrent breast cancer, left [facility]) (oncologist-- dr layla ), Renal artery stenosis (HCC), and Wears glasses.    PAST SURGICAL HISTORY: Past Surgical History:  Procedure Laterality Date   BREAST LUMPECTOMY WITH RADIOACTIVE SEED LOCALIZATION Left 02/20/2019   Procedure: LEFT BREAST LUMPECTOMY WITH RADIOACTIVE SEED LOCALIZATION;  Surgeon: Ebbie Cough, MD;  Location[facility] OR;  Service:  General;  Laterality: Left;   BREAST SURGERY Left    Transflap   COLONOSCOPY     COLONOSCOPY     IR IMAGING GUIDED PORT INSERTION  11/25/2023   LEFT HEART CATH AND CORONARY ANGIOGRAPHY N/A 12/16/2023   Procedure: LEFT HEART CATH AND CORONARY ANGIOGRAPHY;  Surgeon: [place], Peter M, MD;  Location: [facility] INVASIVE CV LAB;  Service: Cardiovascular;  Laterality: N/A;   MASTECTOMY Left 2004   w/  TRAM flap construction and right breast augmentation with abdominoplasy   PARS PLANA VITRECTOMY Left 12/18/2018   Procedure: PARS PLANA VITRECTOMY WITH 25 GAUGE, ENDOLASER;  Surgeon: Tobie Baptist, MD;  Location: [facility] OR;  Service: Ophthalmology;  Laterality: Left;   PORTACATH PLACEMENT N/A 09/25/2018   Procedure: INSERTION PORT-A-CATH WITH ULTRASOUND;  Surgeon: Ebbie Cough, MD;  Location: [facility] ORS;  Service: General;  Laterality: N/A;   TUBAL LIGATION Bilateral yrs ago    FAMILY HISTORY: family history includes Diabetes in her mother; Hypertension in her mother; Kidney disease in her father.  SOCIAL HISTORY:  reports that she has never smoked. She has never been exposed to tobacco smoke. She has never used smokeless tobacco. She reports that she does not currently use alcohol. She reports that she does not use drugs.  ALLERGIES: Bee pollen  MEDICATIONS:  Current Outpatient Medications  Medication Sig Dispense Refill   ALPRAZolam  (XANAX ) 0.5 MG tablet Take 1 tablet (0.5 mg total) by mouth at bedtime as needed for anxiety. 30 tablet 0   amLODipine  (NORVASC ) 10 MG  tablet Take 1 tablet by mouth daily. 90 tablet 2   atorvastatin  (LIPITOR) 20 MG tablet Take 1 tablet (20 mg total) by mouth daily. 90 tablet 3   brimonidine  (ALPHAGAN ) 0.2 % ophthalmic solution Place 1 drop into both eyes 3 (three) times daily.     dexamethasone  (DECADRON ) 4 MG tablet Take 2 tabs by mouth 2 times daily starting day before chemo. Then take 2 tabs daily for 2 days starting day after chemo. Take with food. 30 tablet 1   DOCEtaxel   (TAXOTERE ) 20 MG/ML CONC Inject 121 mg into the vein once.     dorzolamide -timolol  (COSOPT ) 2-0.5 % ophthalmic solution 1 drop 2 (two) times daily.     lidocaine -prilocaine  (EMLA ) cream Apply to affected area 1 hour before access prn 30 g 3   losartan  (COZAAR ) 50 MG tablet TAKE 1 TABLET(50 MG) BY MOUTH DAILY 90 tablet 3   magnesium  oxide (MAG-OX) 400 (240 Mg) MG tablet Take 1 tablet (400 mg total) by mouth 2 (two) times daily. (Patient not taking: Reported on 04/09/2024) 10 tablet 0   meloxicam  (MOBIC ) 7.5 MG tablet Take 1 tablet (7.5 mg total) by mouth daily. 30 tablet 0   omeprazole  (PRILOSEC) 20 MG capsule Take 1 capsule (20 mg total) by mouth daily. 90 capsule 1   ondansetron  (ZOFRAN ) 8 MG tablet Take 1 tablet (8 mg total) by mouth every 8 (eight) hours as needed for nausea or vomiting. 20 tablet 3   pegfilgrastim  (NEULASTA  ONPRO KIT) 6 MG/0.6ML injection Inject 6 mg into the skin once. Inject via provided programmed delivery device.     pertuzumab  (PERJETA ) 420 MG/14ML SOLN Inject 420 mg into the vein once.     prochlorperazine  (COMPAZINE ) 10 MG tablet Take 1 tablet (10 mg total) by mouth every 6 (six) hours as needed for nausea or vomiting. 30 tablet 1   trastuzumab -anns 336 mg in sodium chloride  0.9 % 250 mL Inject 336 mg into the vein once.     zoledronic  acid (ZOMETA ) 4 MG/5ML injection Inject 4 mg into the vein once.     No current facility-administered medications for this encounter.    REVIEW OF SYSTEMS:  Notable for that above.   PHYSICAL EXAM:  vitals were not taken for this visit.   General: Alert and oriented, in no acute distress *** HEENT: Head is normocephalic. Extraocular movements are intact. Oropharynx is clear. Neck: Neck is supple, no palpable cervical or supraclavicular lymphadenopathy. Heart: Regular in rate and rhythm with no murmurs, rubs, or gallops. Chest: Clear to auscultation bilaterally, with no rhonchi, wheezes, or rales. Abdomen: Soft, nontender,  nondistended, with no rigidity or guarding. Extremities: No cyanosis or edema. Lymphatics: see Neck Exam Skin: No concerning lesions. Musculoskeletal: symmetric strength and muscle tone throughout. Neurologic: Cranial nerves II through XII are grossly intact. No obvious focalities. Speech is fluent. Coordination is intact. Psychiatric: Judgment and insight are intact. Affect is appropriate.   ECOG = ***  0 - Asymptomatic (Fully active, able to carry on all predisease activities without restriction)  1 - Symptomatic but completely ambulatory (Restricted in physically strenuous activity but ambulatory and able to carry out work of a light or sedentary nature. For example, light housework, office work)  2 - Symptomatic, <50% in bed during the day (Ambulatory and capable of all self care but unable to carry out any work activities. Up and about more than 50% of waking hours)  3 - Symptomatic, >50% in bed, but not bedbound (Capable of only limited  self-care, confined to bed or chair 50% or more of waking hours)  4 - Bedbound (Completely disabled. Cannot carry on any self-care. Totally confined to bed or chair)  5 - Death   Raylene MM, Creech RH, Tormey DC, et al. [phone]). Toxicity and response criteria of the [facility]. Am. DOROTHA Bridges. Oncol. 5 (6): 649-55   LABORATORY DATA:  Lab Results  Component Value Date   WBC 5.4 04/09/2024   HGB 10.5 (L) 04/09/2024   HCT 30.4 (L) 04/09/2024   MCV 95.3 04/09/2024   PLT 213 04/09/2024   CMP     Component Value Date/Time   NA 136 04/09/2024 0846   NA 140 10/12/2017 0950   K 3.8 04/09/2024 0846   CL 106 04/09/2024 0846   CO2 24 04/09/2024 0846   GLUCOSE 149 (H) 04/09/2024 0846   BUN 14 04/09/2024 0846   BUN 10 10/12/2017 0950   CREATININE 0.67 04/09/2024 0846   CALCIUM  9.0 04/09/2024 0846   PROT 7.0 04/09/2024 0846   PROT 7.5 10/12/2017 0950   ALBUMIN 4.0 04/09/2024 0846   ALBUMIN 4.3 10/12/2017 0950   AST 19  04/09/2024 0846   ALT 18 04/09/2024 0846   ALKPHOS 92 04/09/2024 0846   BILITOT 0.4 04/09/2024 0846   GFR 87.30 10/17/2023 1107   GFRNONAA >60 04/09/2024 0846         RADIOGRAPHY: ECHOCARDIOGRAM COMPLETE Result Date: 04/11/2024    ECHOCARDIOGRAM REPORT   Patient Name:   Paige Foster Date of Exam: 04/11/2024 Medical Rec #:  [id number]       Height:       64.0 in Accession #:    [id number]      Weight:       118.7 lb Date of Birth:  1965/10/23       BSA:          1.567 m Patient Age:    58 years        BP:           120/70 mmHg Patient Gender: F               HR:           69 bpm. Exam Location:  Outpatient Procedure: 2D Echo, Cardiac Doppler, Color Doppler, 3D Echo and Strain Analysis            (Both Spectral and Color Flow Doppler were utilized during            procedure). Indications:    Chemo Z09  History:        Patient has prior history of Echocardiogram examinations, most                 recent 12/16/2023.  Sonographer:    Tinnie Gosling RDCS Referring Phys: 56 LINDSEY CORNETTO CAUSEY IMPRESSIONS  1. Left ventricular ejection fraction, by estimation, is 60 to 65%. Left ventricular ejection fraction by 2D MOD biplane is 68.5 %. The left ventricle has normal function. The left ventricle has no regional wall motion abnormalities. Left ventricular diastolic parameters are consistent with Grade I diastolic dysfunction (impaired relaxation). The average left ventricular global longitudinal strain is -21.3 %. The global longitudinal strain is normal.  2. Right ventricular systolic function is normal. The right ventricular size is normal. There is normal pulmonary artery systolic pressure. The estimated right ventricular systolic pressure is 23.2 mmHg.  3. Left atrial size was moderately dilated.  4. The mitral valve is normal in  structure. No evidence of mitral valve regurgitation. No evidence of mitral stenosis.  5. The aortic valve is tricuspid. Aortic valve regurgitation is not visualized. No aortic  stenosis is present.  6. The inferior vena cava is normal in size with greater than 50% respiratory variability, suggesting right atrial pressure of 3 mmHg. FINDINGS  Left Ventricle: Left ventricular ejection fraction, by estimation, is 60 to 65%. Left ventricular ejection fraction by 2D MOD biplane is 68.5 %. The left ventricle has normal function. The left ventricle has no regional wall motion abnormalities. The average left ventricular global longitudinal strain is -21.3 %. Strain was performed and the global longitudinal strain is normal. The left ventricular internal cavity size was normal in size. There is no left ventricular hypertrophy. Left ventricular diastolic parameters are consistent with Grade I diastolic dysfunction (impaired relaxation). Right Ventricle: The right ventricular size is normal. No increase in right ventricular wall thickness. Right ventricular systolic function is normal. There is normal pulmonary artery systolic pressure. The tricuspid regurgitant velocity is 2.25 m/s, and  with an assumed right atrial pressure of 3 mmHg, the estimated right ventricular systolic pressure is 23.2 mmHg. Left Atrium: Left atrial size was moderately dilated. Right Atrium: Right atrial size was normal in size. Pericardium: There is no evidence of pericardial effusion. Mitral Valve: The mitral valve is normal in structure. No evidence of mitral valve regurgitation. No evidence of mitral valve stenosis. Tricuspid Valve: The tricuspid valve is normal in structure. Tricuspid valve regurgitation is mild. Aortic Valve: The aortic valve is tricuspid. Aortic valve regurgitation is not visualized. No aortic stenosis is present. Pulmonic Valve: The pulmonic valve was normal in structure. Pulmonic valve regurgitation is not visualized. Aorta: The aortic root is normal in size and structure. Venous: The inferior vena cava is normal in size with greater than 50% respiratory variability, suggesting right atrial pressure  of 3 mmHg. IAS/Shunts: No atrial level shunt detected by color flow Doppler.  LEFT VENTRICLE PLAX 2D                        Biplane EF (MOD) LVIDd:         4.50 cm         LV Biplane EF:   Left LVIDs:         2.90 cm                          ventricular LV PW:         1.00 cm                          ejection LV IVS:        1.00 cm                          fraction by LVOT diam:     2.00 cm                          2D MOD LV SV:         83                               biplane is LV SV Index:   53  68.5 %. LVOT Area:     3.14 cm                                Diastology                                LV e' medial:    9.57 cm/s LV Volumes (MOD)               LV E/e' medial:  8.5 LV vol d, MOD    107.0 ml      LV e' lateral:   19.00 cm/s A2C:                           LV E/e' lateral: 4.3 LV vol d, MOD    122.0 ml A4C:                           2D Longitudinal LV vol s, MOD    29.7 ml       Strain A2C:                           2D Strain GLS   -21.3 % LV vol s, MOD    40.6 ml       Avg: A4C: LV SV MOD A2C:   77.3 ml LV SV MOD A4C:   122.0 ml LV SV MOD BP:    79.0 ml RIGHT VENTRICLE             IVC RV S prime:     10.80 cm/s  IVC diam: 1.50 cm TAPSE (M-mode): 2.3 cm LEFT ATRIUM             Index        RIGHT ATRIUM           Index LA diam:        3.10 cm 1.98 cm/m   RA Area:     11.70 cm LA Vol (A2C):   57.9 ml 36.94 ml/m  RA Volume:   22.80 ml  14.55 ml/m LA Vol (A4C):   88.5 ml 56.47 ml/m LA Biplane Vol: 72.0 ml 45.94 ml/m  AORTIC VALVE LVOT Vmax:   143.00 cm/s LVOT Vmean:  84.800 cm/s LVOT VTI:    0.265 m  AORTA Ao Root diam: 2.60 cm Ao Asc diam:  2.80 cm MITRAL VALVE               TRICUSPID VALVE MV Area (PHT): 3.43 cm    TR Peak grad:   20.2 mmHg MV Decel Time: 221 msec    TR Vmax:        225.00 cm/s MV E velocity: 81.80 cm/s MV A velocity: 79.70 cm/s  SHUNTS MV E/A ratio:  1.03        Systemic VTI:  0.26 m                            Systemic Diam: 2.00 cm Dalton McleanMD  Electronically signed by Ezra Kanner Signature Date/Time: 04/11/2024/5:31:07 PM    Final    DG Scapula Right Result Date: 04/09/2024 CLINICAL DATA:  Right shoulder pain, metastatic breast cancer. EXAM: RIGHT SCAPULA - 2+ VIEWS COMPARISON:  Shoulder  series today and 08/03/2023. FINDINGS: Destructive, permeative process noted at the tip of the right scapula compatible with metastasis. Lucency also seen in the scapula near the glenoid could also reflect a lytic lesion. Healing right lateral 7th and 9th rib fractures, question pathologic fractures. Degenerative changes in the right AC joint. Glenohumeral joint is intact. IMPRESSION: Destructive, permeative process at the tip of the right scapula. Lucent area noted in the scapula near the glenoid. Findings are concerning for metastases. Healing fractures with mixed density in the right lateral 7th and 9th ribs could reflect metastases with pathologic fractures. Electronically Signed   By: Franky Crease M.D.   On: 04/09/2024 14:15   DG Shoulder Right Result Date: 04/09/2024 CLINICAL DATA:  Right shoulder pain.  Metastatic breast cancer. EXAM: RIGHT SHOULDER - 2+ VIEW COMPARISON:  08/03/2023 FINDINGS: There is a permeative destructive lesion noted at the tip of the right scapula. This is compatible with metastasis. No visible fracture, subluxation or dislocation. Mild degenerative changes in the right AC joint. IMPRESSION: Destructive, permeative lesion in the tip of the right scapula compatible with metastasis. Electronically Signed   By: Franky Crease M.D.   On: 04/09/2024 14:12      IMPRESSION/PLAN:***    On date of service, in total, I spent *** minutes on this encounter. Patient was seen in person.   __________________________________________   Lauraine Golden, MD  This document serves as a record of services personally performed by Lauraine Golden, MD. It was created on her behalf by Dorthy Fuse, a trained medical scribe. The creation of this record  is based on the scribe's personal observations and the provider's statements to them. This document has been checked and approved by the attending provider.

## 2024-04-13 ENCOUNTER — Encounter: Payer: Self-pay | Admitting: Hematology and Oncology

## 2024-04-13 ENCOUNTER — Encounter: Payer: Self-pay | Admitting: Radiation Oncology

## 2024-04-13 ENCOUNTER — Telehealth: Payer: Self-pay | Admitting: *Deleted

## 2024-04-13 ENCOUNTER — Ambulatory Visit
Admission: RE | Admit: 2024-04-13 | Discharge: 2024-04-13 | Disposition: A | Discharge status: Home / Self Care | Source: Ambulatory Visit | Attending: Radiation Oncology | Admitting: Radiation Oncology

## 2024-04-13 VITALS — BP 125/79 | HR 89 | Temp 98.1°F | Resp 18 | Ht 64.0 in | Wt 119.0 lb

## 2024-04-13 DIAGNOSIS — C787 Secondary malignant neoplasm of liver and intrahepatic bile duct: Secondary | ICD-10-CM | POA: Insufficient documentation

## 2024-04-13 DIAGNOSIS — Z791 Long term (current) use of non-steroidal anti-inflammatories (NSAID): Secondary | ICD-10-CM | POA: Insufficient documentation

## 2024-04-13 DIAGNOSIS — C7951 Secondary malignant neoplasm of bone: Secondary | ICD-10-CM | POA: Insufficient documentation

## 2024-04-13 DIAGNOSIS — Z9012 Acquired absence of left breast and nipple: Secondary | ICD-10-CM | POA: Diagnosis not present

## 2024-04-13 DIAGNOSIS — Z79899 Other long term (current) drug therapy: Secondary | ICD-10-CM | POA: Insufficient documentation

## 2024-04-13 DIAGNOSIS — C50412 Malignant neoplasm of upper-outer quadrant of left female breast: Secondary | ICD-10-CM | POA: Insufficient documentation

## 2024-04-13 DIAGNOSIS — Z170 Estrogen receptor positive status [ER+]: Secondary | ICD-10-CM | POA: Insufficient documentation

## 2024-04-13 DIAGNOSIS — Z7952 Long term (current) use of systemic steroids: Secondary | ICD-10-CM | POA: Diagnosis not present

## 2024-04-13 DIAGNOSIS — Z79811 Long term (current) use of aromatase inhibitors: Secondary | ICD-10-CM | POA: Insufficient documentation

## 2024-04-13 DIAGNOSIS — I10 Essential (primary) hypertension: Secondary | ICD-10-CM | POA: Diagnosis not present

## 2024-04-13 DIAGNOSIS — Z923 Personal history of irradiation: Secondary | ICD-10-CM | POA: Diagnosis not present

## 2024-04-13 DIAGNOSIS — M19011 Primary osteoarthritis, right shoulder: Secondary | ICD-10-CM | POA: Diagnosis not present

## 2024-04-13 DIAGNOSIS — Z860100 Personal history of colon polyps, unspecified: Secondary | ICD-10-CM | POA: Insufficient documentation

## 2024-04-13 NOTE — Telephone Encounter (Signed)
 CALLED PATIENT TO INFORM OF MRI FOR 04-20-24 - ARRIVAL TIME- 8:45 AM @ [facility], NO RESTRICTIONS TO SCAN, SPOKE WITH PATIENT AND SHE IS AWARE OF THIS SCAN AND THE INSTRUCTIONS

## 2024-04-16 ENCOUNTER — Encounter: Payer: Self-pay | Admitting: Radiation Oncology

## 2024-04-17 ENCOUNTER — Encounter (HOSPITAL_BASED_OUTPATIENT_CLINIC_OR_DEPARTMENT_OTHER): Payer: Self-pay

## 2024-04-18 ENCOUNTER — Ambulatory Visit (HOSPITAL_BASED_OUTPATIENT_CLINIC_OR_DEPARTMENT_OTHER)
Admission: RE | Admit: 2024-04-18 | Discharge: 2024-04-18 | Disposition: A | Discharge status: Home / Self Care | Source: Ambulatory Visit | Attending: Adult Health | Admitting: Adult Health

## 2024-04-18 DIAGNOSIS — S32401A Unspecified fracture of right acetabulum, initial encounter for closed fracture: Secondary | ICD-10-CM | POA: Diagnosis not present

## 2024-04-18 DIAGNOSIS — C7951 Secondary malignant neoplasm of bone: Secondary | ICD-10-CM | POA: Insufficient documentation

## 2024-04-18 DIAGNOSIS — C50912 Malignant neoplasm of unspecified site of left female breast: Secondary | ICD-10-CM | POA: Insufficient documentation

## 2024-04-18 DIAGNOSIS — I700 Atherosclerosis of aorta: Secondary | ICD-10-CM | POA: Diagnosis not present

## 2024-04-18 MED ORDER — IOHEXOL 300 MG/ML  SOLN
80.0000 mL | Freq: Once | INTRAMUSCULAR | Status: AC | PRN
  Administered 2024-04-18: 80 mL via INTRAVENOUS

## 2024-04-20 ENCOUNTER — Ambulatory Visit (HOSPITAL_BASED_OUTPATIENT_CLINIC_OR_DEPARTMENT_OTHER)
Admission: RE | Admit: 2024-04-20 | Discharge: 2024-04-20 | Disposition: A | Discharge status: Home / Self Care | Source: Ambulatory Visit | Attending: Radiation Oncology | Admitting: Radiation Oncology

## 2024-04-20 DIAGNOSIS — M7581 Other shoulder lesions, right shoulder: Secondary | ICD-10-CM | POA: Diagnosis not present

## 2024-04-20 DIAGNOSIS — C7951 Secondary malignant neoplasm of bone: Secondary | ICD-10-CM | POA: Diagnosis not present

## 2024-04-20 DIAGNOSIS — S46011A Strain of muscle(s) and tendon(s) of the rotator cuff of right shoulder, initial encounter: Secondary | ICD-10-CM | POA: Diagnosis not present

## 2024-04-20 DIAGNOSIS — M129 Arthropathy, unspecified: Secondary | ICD-10-CM | POA: Diagnosis not present

## 2024-04-20 MED ORDER — GADOBUTROL 1 MMOL/ML IV SOLN
6.0000 mL | Freq: Once | INTRAVENOUS | Status: AC | PRN
  Administered 2024-04-20: 6 mL via INTRAVENOUS
  Filled 2024-04-20: qty 6

## 2024-04-25 ENCOUNTER — Ambulatory Visit: Admitting: Radiation Oncology

## 2024-04-30 ENCOUNTER — Inpatient Hospital Stay

## 2024-04-30 ENCOUNTER — Inpatient Hospital Stay (HOSPITAL_BASED_OUTPATIENT_CLINIC_OR_DEPARTMENT_OTHER): Admitting: Hematology and Oncology

## 2024-04-30 VITALS — BP 104/63 | HR 70 | Temp 97.6°F | Resp 16 | Wt 119.6 lb

## 2024-04-30 DIAGNOSIS — C7951 Secondary malignant neoplasm of bone: Secondary | ICD-10-CM | POA: Insufficient documentation

## 2024-04-30 DIAGNOSIS — C50912 Malignant neoplasm of unspecified site of left female breast: Secondary | ICD-10-CM

## 2024-04-30 DIAGNOSIS — C50412 Malignant neoplasm of upper-outer quadrant of left female breast: Secondary | ICD-10-CM

## 2024-04-30 DIAGNOSIS — C787 Secondary malignant neoplasm of liver and intrahepatic bile duct: Secondary | ICD-10-CM | POA: Diagnosis not present

## 2024-04-30 DIAGNOSIS — Z170 Estrogen receptor positive status [ER+]: Secondary | ICD-10-CM

## 2024-04-30 DIAGNOSIS — Z5111 Encounter for antineoplastic chemotherapy: Secondary | ICD-10-CM | POA: Insufficient documentation

## 2024-04-30 DIAGNOSIS — Z79899 Other long term (current) drug therapy: Secondary | ICD-10-CM | POA: Diagnosis not present

## 2024-04-30 DIAGNOSIS — C7800 Secondary malignant neoplasm of unspecified lung: Secondary | ICD-10-CM | POA: Diagnosis not present

## 2024-04-30 DIAGNOSIS — Z9013 Acquired absence of bilateral breasts and nipples: Secondary | ICD-10-CM | POA: Insufficient documentation

## 2024-04-30 DIAGNOSIS — Z5112 Encounter for antineoplastic immunotherapy: Secondary | ICD-10-CM | POA: Diagnosis not present

## 2024-04-30 DIAGNOSIS — Z510 Encounter for antineoplastic radiation therapy: Secondary | ICD-10-CM | POA: Diagnosis not present

## 2024-04-30 LAB — CBC WITH DIFFERENTIAL (CANCER CENTER ONLY)
Abs Immature Granulocytes: 0.01 K/uL (ref 0.00–0.07)
Basophils Absolute: 0 K/uL (ref 0.0–0.1)
Basophils Relative: 1 %
Eosinophils Absolute: 0 K/uL (ref 0.0–0.5)
Eosinophils Relative: 0 %
HCT: 31.1 % — ABNORMAL LOW (ref 36.0–46.0)
Hemoglobin: 10.7 g/dL — ABNORMAL LOW (ref 12.0–15.0)
Immature Granulocytes: 0 %
Lymphocytes Relative: 15 %
Lymphs Abs: 0.7 K/uL (ref 0.7–4.0)
MCH: 32.9 pg (ref 26.0–34.0)
MCHC: 34.4 g/dL (ref 30.0–36.0)
MCV: 95.7 fL (ref 80.0–100.0)
Monocytes Absolute: 0.6 K/uL (ref 0.1–1.0)
Monocytes Relative: 13 %
Neutro Abs: 3.4 K/uL (ref 1.7–7.7)
Neutrophils Relative %: 71 %
Platelet Count: 330 K/uL (ref 150–400)
RBC: 3.25 MIL/uL — ABNORMAL LOW (ref 3.87–5.11)
RDW: 15.3 % (ref 11.5–15.5)
WBC Count: 4.7 K/uL (ref 4.0–10.5)
nRBC: 0 % (ref 0.0–0.2)

## 2024-04-30 LAB — CMP (CANCER CENTER ONLY)
ALT: 19 U/L (ref 0–44)
AST: 19 U/L (ref 15–41)
Albumin: 3.9 g/dL (ref 3.5–5.0)
Alkaline Phosphatase: 113 U/L (ref 38–126)
Anion gap: 5 (ref 5–15)
BUN: 20 mg/dL (ref 6–20)
CO2: 26 mmol/L (ref 22–32)
Calcium: 9.3 mg/dL (ref 8.9–10.3)
Chloride: 107 mmol/L (ref 98–111)
Creatinine: 0.57 mg/dL (ref 0.44–1.00)
GFR, Estimated: >60 mL/min (ref >60)
Glucose, Bld: 115 mg/dL — ABNORMAL HIGH (ref 70–99)
Potassium: 3.9 mmol/L (ref 3.5–5.1)
Sodium: 138 mmol/L (ref 135–145)
Total Bilirubin: 0.3 mg/dL (ref 0.0–1.2)
Total Protein: 6.7 g/dL (ref 6.5–8.1)

## 2024-04-30 MED ORDER — SODIUM CHLORIDE 0.9 % IV SOLN: INTRAVENOUS | Status: DC

## 2024-04-30 MED ORDER — ACETAMINOPHEN 325 MG PO TABS
650.0000 mg | ORAL_TABLET | Freq: Once | ORAL | Status: AC
  Administered 2024-04-30: 650 mg via ORAL
  Filled 2024-04-30: qty 2

## 2024-04-30 MED ORDER — SODIUM CHLORIDE 0.9 % IV SOLN
60.0000 mg/m2 | Freq: Once | INTRAVENOUS | Status: AC
  Administered 2024-04-30: 97 mg via INTRAVENOUS
  Filled 2024-04-30: qty 9.7

## 2024-04-30 MED ORDER — DEXAMETHASONE SODIUM PHOSPHATE 10 MG/ML IJ SOLN
10.0000 mg | Freq: Once | INTRAMUSCULAR | Status: AC
  Administered 2024-04-30: 10 mg via INTRAVENOUS
  Filled 2024-04-30: qty 1

## 2024-04-30 MED ORDER — TRASTUZUMAB-ANNS CHEMO 150 MG IV SOLR
6.0000 mg/kg | Freq: Once | INTRAVENOUS | Status: AC
  Administered 2024-04-30: 336 mg via INTRAVENOUS
  Filled 2024-04-30: qty 16

## 2024-04-30 MED ORDER — SODIUM CHLORIDE 0.9 % IV SOLN
420.0000 mg | Freq: Once | INTRAVENOUS | Status: AC
  Administered 2024-04-30: 420 mg via INTRAVENOUS
  Filled 2024-04-30: qty 14

## 2024-04-30 MED ORDER — DIPHENHYDRAMINE HCL 25 MG PO CAPS
50.0000 mg | ORAL_CAPSULE | Freq: Once | ORAL | Status: AC
  Administered 2024-04-30: 50 mg via ORAL
  Filled 2024-04-30: qty 2

## 2024-04-30 NOTE — Progress Notes (Signed)
 [facility]  [phone] [phone]    ID: [name] DOB: Dec 07, 1965  MR#: [id number]  RDW#:[id number]  Patient Care Team: Katheen Roselie Rockford, NP as PCP - General (Internal Medicine) Kate Lonni CROME, MD as PCP - Cardiology (Cardiology) Ebbie Cough, MD as Consulting Physician (General Surgery) Tobie Baptist, MD as Consulting Physician (Ophthalmology) Legrand Victory CROME MOULD, MD as Consulting Physician (Gastroenterology) Abigail Maude POUR (Optometry) Loretha Ash, MD as Consulting Physician (Hematology and Oncology) Izell Domino, MD as Attending Physician (Radiation Oncology) OTHER MD:   CHIEF COMPLAINT: 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  She experiences right shoulder pain, now rated as 3 out of 10 in severity, localized to the upper part of the right shoulder without radiation. She is not taking any pain medication as the pain is manageable. Radiation therapy is scheduled to start on Thursday to address the lesion in the shoulder area.  She is currently undergoing chemotherapy with docetaxel , Herceptin , and pertuzumab . She reports no major side effects from the chemotherapy, including no diarrhea, tingling, or numbness in her hands or feet, and no issues with dropping things. Her appetite has improved, and she is tolerating the chemotherapy well.  No headaches, double vision, falls, or seizures. Breathing is fine, and there is no other bone pain besides the shoulder. She is experiencing some constipation and is managing it by avoiding excessive meat and cheese intake. No issues with urination, and her urine is clear.  She is concerned about her liver due to the presence of  lesions.  She continues to work despite her condition and has considered applying for disability due to the lengthy process involved.  Rest of the pertinent 10 point ROS reviewed and neg.  HISTORY OF CURRENT ILLNESS: From the original intake note:  Paige Foster has a prior history of left breast cancer, dating back to 2004. At that time she underwent a left mastectomy for stage 0 (noninvasive) breast cancer, with transverse rectus abdominis (TRAM) flap construction under Dr. Marcus. She also underwent a right breast reduction. She took tamoxifen for three years.  More recently she underwent bilateral diagnostic mammography with tomography and left breast ultrasonography at [facility] on 01/03/2018 showing: Breast Density Category B. There is an oval fat containing lesion in the left breast upper outer quadrant posterior depth. No other significant masses, calcifications, or other findings are seen in either breast. Sonographically, there is a 1.5 cm lesion in the left breast upper outer quadrant posterior depth. This lesion is of mixed echogenicity. This correlates as palpated and with mammography findings. Follow up was recommended.  Close follow-up was suggested.  She then presented with a non-tender mass in the left reconstructed breast on 08/30/2018. On physical exam, there is a hard palpable lump measuring 2.0 cm in the upper outer left reconstructed breast 10 cm from the expected location of a nipple. Sonography over this area demonstrates a 1.8 cm x 1.7 cm x 1.4 cm mass in the left breast at 2 o'clock posterior depth 10 cm from the nipple. This mass is of mixed echogenicity. This abnormality is increased in size and correlates as palpated and with prior mammography findings. Color flow imaging demonstrates that there is vascularity present. Elastography imaging assessment is intermediate. No significant abnormalities  were seen sonographically in the left axilla.    Accordingly on 08/30/2018  she proceeded to biopsy of the left breast mass in question. The pathology from this procedure showed ([id number]): invasive ductal carcinoma, grade III. Prognostic indicators significant for: estrogen receptor, 100% positive with strong staining intensity and progesterone receptor, 0% negative. Proliferation marker Ki67 at 15%. HER2 positive (3+) by immunohistochemistry.  The patient's subsequent history is as detailed below.   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    Port-A-Cath in place 09/28/2018   Recurrent breast cancer, left [facility]) oncologist-- dr layla    dx 2004, noninvasive Stage 0 ----s/p left mastectomy w/ tram flap construction (and right breast reduction), taken Tamoxifen for 3 yrs;   08-30-2018 recurrent left cancer , Grade III,  cT1c,  ER positive, PR negative, HER-2 positive, invasive ductal carcinoma-- neoadjuvant chemo to start 09-26-2018   Renal artery stenosis    mild right external renal artery stenosis per duplex in epic 08-09-2013   Wears glasses     PAST SURGICAL HISTORY: Past Surgical History:  Procedure Laterality Date   BREAST LUMPECTOMY WITH RADIOACTIVE SEED LOCALIZATION Left 02/20/2019   Procedure: LEFT BREAST LUMPECTOMY WITH RADIOACTIVE SEED LOCALIZATION;  Surgeon: Ebbie Cough, MD;  Location: [facility] OR;  Service: General;  Laterality: Left;   BREAST SURGERY Left    Transflap   COLONOSCOPY     COLONOSCOPY     IR IMAGING GUIDED PORT INSERTION  11/25/2023   LEFT HEART CATH AND CORONARY ANGIOGRAPHY N/A 12/16/2023   Procedure: LEFT HEART CATH AND CORONARY ANGIOGRAPHY;  Surgeon: Swaziland, Peter M, MD;  Location: [facility] INVASIVE CV LAB;  Service: Cardiovascular;  Laterality: N/A;   MASTECTOMY Left 2004   w/  TRAM flap construction and right breast augmentation with  abdominoplasy   PARS PLANA VITRECTOMY Left 12/18/2018   Procedure: PARS PLANA VITRECTOMY WITH 25 GAUGE, ENDOLASER;  Surgeon: Tobie Baptist, MD;  Location: [facility] OR;  Service: Ophthalmology;  Laterality: Left;   PORTACATH PLACEMENT N/A 09/25/2018   Procedure: INSERTION PORT-A-CATH WITH ULTRASOUND;  Surgeon: Ebbie Cough, MD;  Location: [facility] ORS;  Service: General;  Laterality: N/A;   TUBAL LIGATION Bilateral yrs ago    FAMILY HISTORY: Family History  Problem Relation Age of Onset   Diabetes Mother    Hypertension Mother    Kidney disease Father    Colon cancer Neg Hx    Colon polyps Neg Hx    Gallbladder disease Neg Hx    Heart disease Neg Hx    Esophageal cancer Neg Hx    Stomach cancer Neg Hx    Rectal cancer Neg Hx   Calene's father died from unknown causes in his early 59's. Patients' mother died from diabetes complications at age 36. The patient has 1 sister. Patient denies anyone in her family having breast, ovarian, prostate, or pancreatic cancer.    GYNECOLOGIC HISTORY:  Patient's last menstrual period was 06/08/2007. 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 Her husband, Zachary, works at [employer]. Heatherly has two children, Fonda and Alm. Fonda lives with  her, is 76, and it attending [employer] for a computer based degree. Alm lives with her, is 57, and recently graduated from [employer] with a degree in Pension scheme manager.  He works for [employer]. Corrin has no grandchildren. She attends the [employer].   ADVANCED DIRECTIVES: In the absence of any documents to the contrary her husband, Zachary, is automatically her healthcare power of attorney     HEALTH MAINTENANCE: Social History   Tobacco Use   Smoking status: Never    Passive exposure: Never   Smokeless tobacco: Never  Vaping Use    Vaping status: Never Used  Substance Use Topics   Alcohol use: Not Currently    Alcohol/week: 0.0 standard drinks of alcohol    Comment: Occassionally   Drug use: No    Colonoscopy: April 2022, [facility]is  PAP: January 2022, [facility]  Bone density:  2017; -1.6, osteopenic   Allergies  Allergen Reactions   Bee Pollen Itching    Watery eyes and nose running    Current Outpatient Medications  Medication Sig Dispense Refill   ALPRAZolam  (XANAX ) 0.5 MG tablet Take 1 tablet (0.5 mg total) by mouth at bedtime as needed for anxiety. 30 tablet 0   amLODipine  (NORVASC ) 10 MG tablet Take 1 tablet by mouth daily. 90 tablet 2   atorvastatin  (LIPITOR) 20 MG tablet Take 1 tablet (20 mg total) by mouth daily. 90 tablet 3   brimonidine  (ALPHAGAN ) 0.2 % ophthalmic solution Place 1 drop into both eyes 3 (three) times daily.     dexamethasone  (DECADRON ) 4 MG tablet Take 2 tabs by mouth 2 times daily starting day before chemo. Then take 2 tabs daily for 2 days starting day after chemo. Take with food. 30 tablet 1   DOCEtaxel  (TAXOTERE ) 20 MG/ML CONC Inject 121 mg into the vein once.     dorzolamide -timolol  (COSOPT ) 2-0.5 % ophthalmic solution 1 drop 2 (two) times daily.     lidocaine -prilocaine  (EMLA ) cream Apply to affected area 1 hour before access prn 30 g 3   losartan  (COZAAR ) 50 MG tablet TAKE 1 TABLET(50 MG) BY MOUTH DAILY 90 tablet 3   magnesium  oxide (MAG-OX) 400 (240 Mg) MG tablet Take 1 tablet (400 mg total) by mouth 2 (two) times daily. (Patient not taking: Reported on 04/09/2024) 10 tablet 0   meloxicam  (MOBIC ) 7.5 MG tablet Take 1 tablet (7.5 mg total) by mouth daily. 30 tablet 0   omeprazole  (PRILOSEC) 20 MG capsule Take 1 capsule (20 mg total) by mouth daily. 90 capsule 1   ondansetron  (ZOFRAN ) 8 MG tablet Take 1 tablet (8 mg total) by mouth every 8 (eight) hours as needed for nausea or vomiting. 20 tablet 3   pegfilgrastim  (NEULASTA  ONPRO KIT) 6 MG/0.6ML injection Inject 6 mg into the skin once.  Inject via provided programmed delivery device.     pertuzumab  (PERJETA ) 420 MG/14ML SOLN Inject 420 mg into the vein once.     prochlorperazine  (COMPAZINE ) 10 MG tablet Take 1 tablet (10 mg total) by mouth every 6 (six) hours as needed for nausea or vomiting. 30 tablet 1   trastuzumab -anns 336 mg in sodium chloride  0.9 % 250 mL Inject 336 mg into the vein once.     zoledronic  acid (ZOMETA ) 4 MG/5ML injection Inject 4 mg into the vein once.     No current facility-administered medications for this visit.   Facility-Administered Medications Ordered in Other Visits  Medication Dose Route Frequency Provider Last Rate Last Admin   0.9 %  sodium chloride  infusion   Intravenous Continuous Vanellope Passmore, MD   Stopped at 04/30/24 1339     OBJECTIVE: African-American woman who appears stated age  Vitals:   04/30/24 0924  BP: 104/63  Pulse: 70  Resp: 16  Temp: 97.6 F (36.4 C)  SpO2: 100%        Body mass index is 20.53 kg/m.   Wt Readings from Last 3 Encounters:  04/30/24 119 lb 9.6 oz (54.3 kg)  04/13/24 119 lb (54 kg)  04/09/24 118 lb 11.2 oz (53.8 kg)   Physical Exam Constitutional:      Appearance: Normal appearance.  Cardiovascular:     Rate and Rhythm: Normal rate and regular rhythm.     Pulses: Normal pulses.     Heart sounds: Normal heart sounds.  Pulmonary:     Effort: Pulmonary effort is normal.     Breath sounds: Normal breath sounds.  Musculoskeletal:        General: No swelling.     Cervical back: Normal range of motion and neck supple. No rigidity.  Lymphadenopathy:     Cervical: No cervical adenopathy.  Skin:    General: Skin is warm and dry.  Neurological:     General: No focal deficit present.     Mental Status: She is alert.       LAB RESULTS:  CMP     Component Value Date/Time   NA 138 04/30/2024 0900   NA 140 10/12/2017 0950   K 3.9 04/30/2024 0900   CL 107 04/30/2024 0900   CO2 26 04/30/2024 0900   GLUCOSE 115 (H) 04/30/2024 0900    BUN 20 04/30/2024 0900   BUN 10 10/12/2017 0950   CREATININE 0.57 04/30/2024 0900   CALCIUM  9.3 04/30/2024 0900   PROT 6.7 04/30/2024 0900   PROT 7.5 10/12/2017 0950   ALBUMIN 3.9 04/30/2024 0900   ALBUMIN 4.3 10/12/2017 0950   AST 19 04/30/2024 0900   ALT 19 04/30/2024 0900   ALKPHOS 113 04/30/2024 0900   BILITOT 0.3 04/30/2024 0900   GFRNONAA >60 04/30/2024 0900   GFRAA >60 12/06/2019 1441   GFRAA >60 09/07/2018 1455   Lab Results  Component Value Date   WBC 4.7 04/30/2024   NEUTROABS 3.4 04/30/2024   HGB 10.7 (L) 04/30/2024   HCT 31.1 (L) 04/30/2024   MCV 95.7 04/30/2024   PLT 330 04/30/2024    Lab Results  Component Value Date   LABCA2 <4 08/30/2007    No components found for: [id number]  No results for input(s): INR in the last 168 hours.   Lab Results  Component Value Date   LABCA2 <4 08/30/2007    No results found for: CAN199  No results found for: CAN125  Lab Results  Component Value Date   CAN153 23.9 10/27/2023    Lab Results  Component Value Date   CA2729 27.9 10/27/2023    No components found for: HGQUANT  No results found for: CEA1, CEA / No results found for: CEA1, CEA  No results found for: AFPTUMOR  No results found for: CHROMOGRNA  No results found for: TOTALPROTELP, ALBUMINELP, A1GS, A2GS, BETS, BETA2SER, GAMS, MSPIKE, SPEI (this displays SPEP labs)  No results found for: KPAFRELGTCHN, LAMBDASER, KAPLAMBRATIO (kappa/lambda light chains)  No results found for: HGBA, HGBA2QUANT, HGBFQUANT, HGBSQUAN (Hemoglobinopathy evaluation)   Lab Results  Component Value Date   LDH 169 08/30/2007    Lab Results  Component Value Date   IRON 96 07/11/2023   TIBC  382 07/11/2023   IRONPCTSAT 25 07/11/2023   (Iron and TIBC)  Lab Results  Component Value Date   FERRITIN 261 07/11/2023    Urinalysis    Component Value Date/Time   COLORURINE STRAW (A) 12/15/2023 0809    APPEARANCEUR CLEAR 12/15/2023 0809   LABSPEC 1.010 12/15/2023 0809   PHURINE 5.0 12/15/2023 0809   GLUCOSEU NEGATIVE 12/15/2023 0809   HGBUR NEGATIVE 12/15/2023 0809   HGBUR large 04/09/2008 1548   BILIRUBINUR NEGATIVE 12/15/2023 0809   KETONESUR NEGATIVE 12/15/2023 0809   PROTEINUR NEGATIVE 12/15/2023 0809   UROBILINOGEN 0.2 04/09/2008 1548   NITRITE NEGATIVE 12/15/2023 0809   LEUKOCYTESUR NEGATIVE 12/15/2023 0809    STUDIES:  MR SHOULDER RIGHT W WO CONTRAST Result Date: 04/21/2024 CLINICAL DATA:  Chronic right shoulder pain, history of metastatic cancer. EXAM: MRI OF THE RIGHT SHOULDER WITHOUT AND WITH CONTRAST TECHNIQUE: Multiplanar, multisequence MR imaging of the right shoulder was performed before and after the administration of intravenous contrast. CONTRAST:  6mL GADAVIST  GADOBUTROL  1 MMOL/ML IV SOLN COMPARISON:  11/11/2023 FINDINGS: Rotator cuff: Moderate supraspinatus and infraspinatus tendinosis. Teres minor tendon is intact. Subscapularis tendon is intact. Muscles: Moderate muscle strain involving the medial aspect of the subscapularis and teres minor muscles. Biceps Long Head: Moderate tendinosis of the intra-articular portion of the long head of the biceps tendon. Acromioclavicular Joint: Mild-moderate arthropathy of the acromioclavicular joint. No subacromial/subdeltoid bursal fluid. Glenohumeral Joint: No joint effusion. No chondral defect. Labrum: Grossly intact, but evaluation is limited by lack of intraarticular fluid/contrast. Bones: 3.7 x 1 x 2.7 cm expansile bone lesion at the inferior tip of the scapula. 1.6 x 1 x 2.1 cm bone lesion in the neck of the glenoid. 7 mm bone lesion in the proximal humeral metaphysis. Pathologic nondisplaced fracture of the inferior aspect of the body of the scapula. Other: No fluid collection or hematoma. Multiple liver masses partially visualized near the dome of the liver likely reflecting metastatic disease. IMPRESSION: 1. A 3.7 x 1 x 2.7 cm  expansile bone lesion at the inferior tip of the scapula. 1.6 x 1 x 2.1 cm bone lesion in the neck of the glenoid. Findings are consistent with metastatic disease. Pathologic nondisplaced fracture of the inferior aspect of the body of the scapula and moderate muscle strain involving the medial aspect of the subscapularis and teres minor muscles. 2. Moderate supraspinatus and infraspinatus tendinosis. 3. Moderate tendinosis of the intra-articular portion of the long head of the biceps tendon. 4. Multiple liver masses partially visualized near the dome of the liver likely reflecting metastatic disease. Electronically Signed   By: Julaine Blanch M.D.   On: 04/21/2024 08:32   CT CHEST ABDOMEN PELVIS W CONTRAST Result Date: 04/19/2024 CLINICAL DATA:  Breast cancer, invasive, stage IV, assess treatment response. * Tracking Code: BO * EXAM: CT CHEST, ABDOMEN, AND PELVIS WITH CONTRAST TECHNIQUE: Multidetector CT imaging of the chest, abdomen and pelvis was performed following the standard protocol during bolus administration of intravenous contrast. RADIATION DOSE REDUCTION: This exam was performed according to the departmental dose-optimization program which includes automated exposure control, adjustment of the mA and/or kV according to patient size and/or use of iterative reconstruction technique. CONTRAST:  80mL OMNIPAQUE  IOHEXOL  300 MG/ML  SOLN COMPARISON:  CT February 01, 2024. FINDINGS: CT CHEST FINDINGS Cardiovascular: Right chest Port-A-Cath with tip near the superior cavoatrial junction. Aortic atherosclerosis. Normal size heart. No significant pericardial effusion/thickening. Mediastinum/Nodes: No suspicious thyroid  nodule. No pathologically enlarged mediastinal, hilar or axillary lymph nodes. Patulous esophagus  with gas fluid levels. Lungs/Pleura: Stable 2 mm right lower lobe pulmonary nodule on image 31/2. Scattered atelectasis/scarring. No pleural effusion. No pneumothorax. Musculoskeletal: Prior right mastectomy  with surgical change in the left chest. 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 Lucent lesion with associated pathologic fracture in the inferior right scapula similar to prior. Additional lucent lesions in the right scapula for instance on image 13/2 and 12/2 are unchanged. Multifocal osseous metastasis involving the vertebral bodies and ribs are overall similar to prior with healing pathologic fracture of the right lateral seventh rib on image 38/2. Similar sclerosis in the proximal right clavicle at the sternoclavicular joint on image 13/2. CT ABDOMEN PELVIS FINDINGS Hepatobiliary: Innumerable bilobar hepatic metastasis appear more hypodense with less peripheral enhancement than on prior examination. With a few decreased in size but overall they are stable in size from prior. No definite new hepatic lesion identified. For reference: -lesion in the dome of the liver measures 2.9 cm on image 41/2 previously 3 cm -segment III hepatic lesion measures 3.2 cm on image 62/2 previously 3.8 cm. Gallbladder is unremarkable.  No biliary ductal dilation. Pancreas: No pancreatic ductal dilation or evidence of acute inflammation. Spleen: No splenomegaly. Adrenals/Urinary Tract: No suspicious adrenal nodule or mass. Hydronephrosis. Too small to accurately characterize hypodense renal lesions. Urinary bladder is unremarkable for degree of distension. Stomach/Bowel: Stomach is unremarkable for degree of distension. No pathologic dilation of small or large bowel. Vascular/Lymphatic: Aortic atherosclerosis. No pathologically enlarged abdominal or pelvic lymph nodes. Reproductive: Uterus and bilateral adnexa are unremarkable. Other: No significant abdominopelvic free fluid. Musculoskeletal: No significant interval change in the lytic osseous metastatic lesions involving the vertebral bodies, sacrum and bony pelvis. Remote right  acetabular fracture. IMPRESSION: 1. Innumerable bilobar hepatic metastasis appear more hypodense with less peripheral enhancement than on prior examination, with a few decreased in size but overall they are stable in size from prior. No definite new hepatic lesion identified. 2. No significant interval change extensive osseous metastatic lesions. 3. Large left chest wall soft tissue metastasis associated with the seventh rib measures 5.4 x 3.5 cm previously 6.3 x 3.7 cm. 4. Stable 2 mm right lower lobe pulmonary nodule. 5. Patulous esophagus with gas fluid levels, suggestive of gastroesophageal reflux. Electronically Signed   By: Reyes Holder M.D.   On: 04/19/2024 13:42   ECHOCARDIOGRAM COMPLETE Result Date: 04/11/2024    ECHOCARDIOGRAM REPORT   Patient Name:   Paige Foster Date of Exam: 04/11/2024 Medical Rec #:  [id number]       Height:       64.0 in Accession #:    [id number]      Weight:       118.7 lb Date of Birth:  1966-04-23       BSA:          1.567 m Patient Age:    58 years        BP:           120/70 mmHg Patient Gender: F               HR:           69 bpm. Exam Location:  Outpatient Procedure: 2D Echo, Cardiac Doppler, Color Doppler, 3D Echo and Strain Analysis            (Both Spectral and Color Flow Doppler were utilized during  procedure). Indications:    Chemo Z09  History:        Patient has prior history of Echocardiogram examinations, most                 recent 12/16/2023.  Sonographer:    Tinnie Gosling RDCS Referring Phys: [id number] LINDSEY CORNETTO CAUSEY IMPRESSIONS  1. Left ventricular ejection fraction, by estimation, is 60 to 65%. Left ventricular ejection fraction by 2D MOD biplane is 68.5 %. The left ventricle has normal function. The left ventricle has no regional wall motion abnormalities. Left ventricular diastolic parameters are consistent with Grade I diastolic dysfunction (impaired relaxation). The average left ventricular global longitudinal strain is -21.3 %. The  global longitudinal strain is normal.  2. Right ventricular systolic function is normal. The right ventricular size is normal. There is normal pulmonary artery systolic pressure. The estimated right ventricular systolic pressure is 23.2 mmHg.  3. Left atrial size was moderately dilated.  4. The mitral valve is normal in structure. No evidence of mitral valve regurgitation. No evidence of mitral stenosis.  5. The aortic valve is tricuspid. Aortic valve regurgitation is not visualized. No aortic stenosis is present.  6. The inferior vena cava is normal in size with greater than 50% respiratory variability, suggesting right atrial pressure of 3 mmHg. FINDINGS  Left Ventricle: Left ventricular ejection fraction, by estimation, is 60 to 65%. Left ventricular ejection fraction by 2D MOD biplane is 68.5 %. The left ventricle has normal function. The left ventricle has no regional wall motion abnormalities. The average left ventricular global longitudinal strain is -21.3 %. Strain was performed and the global longitudinal strain is normal. The left ventricular internal cavity size was normal in size. There is no left ventricular hypertrophy. Left ventricular diastolic parameters are consistent with Grade I diastolic dysfunction (impaired relaxation). Right Ventricle: The right ventricular size is normal. No increase in right ventricular wall thickness. Right ventricular systolic function is normal. There is normal pulmonary artery systolic pressure. The tricuspid regurgitant velocity is 2.25 m/s, and  with an assumed right atrial pressure of 3 mmHg, the estimated right ventricular systolic pressure is 23.2 mmHg. Left Atrium: Left atrial size was moderately dilated. Right Atrium: Right atrial size was normal in size. Pericardium: There is no evidence of pericardial effusion. Mitral Valve: The mitral valve is normal in structure. No evidence of mitral valve regurgitation. No evidence of mitral valve stenosis. Tricuspid Valve:  The tricuspid valve is normal in structure. Tricuspid valve regurgitation is mild. Aortic Valve: The aortic valve is tricuspid. Aortic valve regurgitation is not visualized. No aortic stenosis is present. Pulmonic Valve: The pulmonic valve was normal in structure. Pulmonic valve regurgitation is not visualized. Aorta: The aortic root is normal in size and structure. Venous: The inferior vena cava is normal in size with greater than 50% respiratory variability, suggesting right atrial pressure of 3 mmHg. IAS/Shunts: No atrial level shunt detected by color flow Doppler.  LEFT VENTRICLE PLAX 2D                        Biplane EF (MOD) LVIDd:         4.50 cm         LV Biplane EF:   Left LVIDs:         2.90 cm                          ventricular LV PW:  1.00 cm                          ejection LV IVS:        1.00 cm                          fraction by LVOT diam:     2.00 cm                          2D MOD LV SV:         83                               biplane is LV SV Index:   53                               68.5 %. LVOT Area:     3.14 cm                                Diastology                                LV e' medial:    9.57 cm/s LV Volumes (MOD)               LV E/e' medial:  8.5 LV vol d, MOD    107.0 ml      LV e' lateral:   19.00 cm/s A2C:                           LV E/e' lateral: 4.3 LV vol d, MOD    122.0 ml A4C:                           2D Longitudinal LV vol s, MOD    29.7 ml       Strain A2C:                           2D Strain GLS   -21.3 % LV vol s, MOD    40.6 ml       Avg: A4C: LV SV MOD A2C:   77.3 ml LV SV MOD A4C:   122.0 ml LV SV MOD BP:    79.0 ml RIGHT VENTRICLE             IVC RV S prime:     10.80 cm/s  IVC diam: 1.50 cm TAPSE (M-mode): 2.3 cm LEFT ATRIUM             Index        RIGHT ATRIUM           Index LA diam:        3.10 cm 1.98 cm/m   RA Area:     11.70 cm LA Vol (A2C):   57.9 ml 36.94 ml/m  RA Volume:   22.80 ml  14.55 ml/m LA Vol (A4C):   88.5 ml 56.47 ml/m LA  Biplane Vol: 72.0 ml 45.94 ml/m  AORTIC VALVE LVOT Vmax:   143.00  cm/s LVOT Vmean:  84.800 cm/s LVOT VTI:    0.265 m  AORTA Ao Root diam: 2.60 cm Ao Asc diam:  2.80 cm MITRAL VALVE               TRICUSPID VALVE MV Area (PHT): 3.43 cm    TR Peak grad:   20.2 mmHg MV Decel Time: 221 msec    TR Vmax:        225.00 cm/s MV E velocity: 81.80 cm/s MV A velocity: 79.70 cm/s  SHUNTS MV E/A ratio:  1.03        Systemic VTI:  0.26 m                            Systemic Diam: 2.00 cm Dalton McleanMD Electronically signed by Ezra Kanner Signature Date/Time: 04/11/2024/5:31:07 PM    Final    DG Scapula Right Result Date: 04/09/2024 CLINICAL DATA:  Right shoulder pain, metastatic breast cancer. EXAM: RIGHT SCAPULA - 2+ VIEWS COMPARISON:  Shoulder series today and 08/03/2023. FINDINGS: Destructive, permeative process noted at the tip of the right scapula compatible with metastasis. Lucency also seen in the scapula near the glenoid could also reflect a lytic lesion. Healing right lateral 7th and 9th rib fractures, question pathologic fractures. Degenerative changes in the right AC joint. Glenohumeral joint is intact. IMPRESSION: Destructive, permeative process at the tip of the right scapula. Lucent area noted in the scapula near the glenoid. Findings are concerning for metastases. Healing fractures with mixed density in the right lateral 7th and 9th ribs could reflect metastases with pathologic fractures. Electronically Signed   By: Franky Crease M.D.   On: 04/09/2024 14:15   DG Shoulder Right Result Date: 04/09/2024 CLINICAL DATA:  Right shoulder pain.  Metastatic breast cancer. EXAM: RIGHT SHOULDER - 2+ VIEW COMPARISON:  08/03/2023 FINDINGS: There is a permeative destructive lesion noted at the tip of the right scapula. This is compatible with metastasis. No visible fracture, subluxation or dislocation. Mild degenerative changes in the right AC joint. IMPRESSION: Destructive, permeative lesion in the tip of the right  scapula compatible with metastasis. Electronically Signed   By: Franky Crease M.D.   On: 04/09/2024 14:12       ELIGIBLE FOR AVAILABLE RESEARCH PROTOCOL: No  ASSESSMENT: 58 y.o. [place], [place] woman  (1) history of left-sided ductal carcinoma in situ 2004  (a) s/p left mastectomy with TRAM reconstruction  (b) status post tamoxifen x3 years  (2) left breast upper outer quadrant biopsy 08/30/2018 shows a clinical T1c N0 invasive ductal carcinoma, grade 3, estrogen receptor strongly positive, progesterone receptor negative, with HER-2 amplification, and and MIB-1 of 15%.   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    (b) chemotherapy discontinued after 4 cycles because of intercurrent eye surgery  (c) continued trastuzumab  and pertuzumab  to complete a year (last dose 09/06/2019)  (d) echocardiogram on 01/23/2019 that shows well preserved EF of 60-65%  (e) echocardiogram on 04/23/2019 shows EF of 60-65%  (f) echocardiogram 08/02/2019 shows an ejection fraction in the 60-65% range  (4) left lumpectomy 02/20/2019 showed a residual  ypT1c NX invasive ductal carcinoma, grade 2 with negative margins.    (  5) adjuvant radiation: Radiation Treatment Dates: 04/12/2019 through 05/28/2019 Site Technique Total Dose (Gy) Dose per Fx (Gy) Completed Fx Beam Energies  Breast: CW_Lt 3D 50.4/50.4 1.8 28/28 6X, 10X  Breast: CW_Lt_SCV_PAB 3D 50.4/50.4 1.8 28/28 6X, 10X  Breast: CW_Lt_Bst Electron 10/10 2 5/5 6X, 10X   (6) anastrozole  started 06/26/2019  (a) DEXA scan at [facility] [place] 12/24/2015 found a T score of -1.6 DEXA scan from mass June 2023 showed T score of -1.4 at L1-L2  (7) genetics  testing 02/03/2019 through the Common Hereditary Cancers Panel offered by [facility] found no deleterious mutations in APC, ATM, AXIN2, BARD1, BMPR1A, BRCA1, BRCA2, BRIP1, CDH1, CDKN2A (p14ARF), CDKN2A (p16INK4a), CKD4, CHEK2, CTNNA1, DICER1, EPCAM (Deletion/duplication testing only), GREM1 (promoter region deletion/duplication testing only), KIT, MEN1, MLH1, MSH2, MSH3, MSH6, MUTYH, NBN, NF1, NHTL1, PALB2, PDGFRA, PMS2, POLD1, POLE, PTEN, RAD50, RAD51C, RAD51D, RNF43, SDHB, SDHC, SDHD, SMAD4, SMARCA4. STK11, TP53, TSC1, TSC2, and VHL.  The following genes were evaluated for sequence changes only: SDHA and HOXB13 c.251G>A variant only.  (8) She had a CXR for unintentional weight loss,  this showed possible left lung pleural based mass and posterior right eighth rib met lesion. CT showed 3.8 x 5.5 x 5 cm left anterior chest wall mass along the mid axillary line with rib involvement and destruction and low-attenuation changes of the center indicating necrotic mass correlates with a neoplastic lesion likely metastatic disease. Liver is enlarged, inhomogeneous with multiple enhancing lesions throughout the right and left lobe of the liver consistent with extensive numerous metastatic liver lesions. Liver appears completely replaced by metastatic disease. Lytic metastatic bone lesions involving the right anterior superior iliac crest, left iliac bone, right inferior pubic ramus, left iliac bone, and L4 vertebral body. Suggestive of lytic bone changes involving the upper thoracic vertebral bodies T1-T2.  PLAN:  Assessment & Plan  Assessment and Plan Assessment & Plan   Metastatic breast cancer with liver and bone involvement Reports improvement in shoulder pain, currently rated as 3/10. CT scans show stable disease with some lesions appearing slightly better. Tolerating chemotherapy well with no major side effects. Liver function appears stable despite concerns about liver lesions. - Continue chemotherapy  with docetaxel  and antibodies (Herceptin  and pertuzumab ) for a total of eight cycles followed by HP maintenance. - Initiate radiation therapy for the right shoulder lesion. - Monitor for disease progression and adjust treatment as necessary.  Chemotherapy-induced diarrhea Experiencing diarrhea likely related to chemotherapy. Managing symptoms by adjusting diet, avoiding excessive meat and cheese intake. - Advise use of Imodium as needed for diarrhea management.  Amber Stalls, MD  Medical Oncology and Hematology [facility] [address] [place], [place] [address] Tel. [phone]    Fax. [phone]  *Total Encounter Time as defined by the Centers for Medicare and Medicaid Services includes, in addition to the face-to-face time of a patient visit (documented in the note above) non-face-to-face time: obtaining and reviewing outside history, ordering and reviewing medications, tests or procedures, care coordination (communications with other health care professionals or caregivers) and documentation in the medical record.

## 2024-04-30 NOTE — Patient Instructions (Signed)
 [facility] - A DEPT OF [facility]  Discharge Instructions: Thank you for choosing [facility] to provide your oncology and hematology care.   If you have a lab appointment with the Cancer Center, please go directly to the Cancer Center and check in at the registration area.   Wear comfortable clothing and clothing appropriate for easy access to any Portacath or PICC line.   We strive to give you quality time with your provider. You may need to reschedule your appointment if you arrive late (15 or more minutes).  Arriving late affects you and other patients whose appointments are after yours.  Also, if you miss three or more appointments without notifying the office, you may be dismissed from the clinic at the provider's discretion.      For prescription refill requests, have your pharmacy contact our office and allow 72 hours for refills to be completed.    Today you received the following chemotherapy and/or immunotherapy agents: Trastuzumab -anns (Kanjinti ), Pertuzumab  (Perjeta ), & Docetaxel  (Taxotere )    To help prevent nausea and vomiting after your treatment, we encourage you to take your nausea medication as directed.  BELOW ARE SYMPTOMS THAT SHOULD BE REPORTED IMMEDIATELY: *FEVER GREATER THAN 100.4 F (38 C) OR HIGHER *CHILLS OR SWEATING *NAUSEA AND VOMITING THAT IS NOT CONTROLLED WITH YOUR NAUSEA MEDICATION *UNUSUAL SHORTNESS OF BREATH *UNUSUAL BRUISING OR BLEEDING *URINARY PROBLEMS (pain or burning when urinating, or frequent urination) *BOWEL PROBLEMS (unusual diarrhea, constipation, pain near the anus) TENDERNESS IN MOUTH AND THROAT WITH OR WITHOUT PRESENCE OF ULCERS (sore throat, sores in mouth, or a toothache) UNUSUAL RASH, SWELLING OR PAIN  UNUSUAL VAGINAL DISCHARGE OR ITCHING   Items with * indicate a potential emergency and should be followed up as soon as possible or go to the Emergency Department if any problems should  occur.  Please show the CHEMOTHERAPY ALERT CARD or IMMUNOTHERAPY ALERT CARD at check-in to the Emergency Department and triage nurse.  Should you have questions after your visit or need to cancel or reschedule your appointment, please contact [facility]  Dept: [phone]  and follow the prompts.  Office hours are 8:00 a.m. to 4:30 p.m. Monday - Friday. Please note that voicemails left after 4:00 p.m. may not be returned until the following business day.  We are closed weekends and major holidays. You have access to a nurse at all times for urgent questions. Please call the main number to the clinic Dept: [phone] and follow the prompts.   For any non-urgent questions, you may also contact your provider using MyChart. We now offer e-Visits for anyone 41 and older to request care online for non-urgent symptoms. For details visit [email or web address].   Also download the MyChart app! Go to the app store, search MyChart, open the app, select [place], and log in with your MyChart username and password.

## 2024-05-02 ENCOUNTER — Inpatient Hospital Stay

## 2024-05-02 VITALS — BP 92/56 | HR 62 | Temp 98.3°F | Resp 16

## 2024-05-02 DIAGNOSIS — Z510 Encounter for antineoplastic radiation therapy: Secondary | ICD-10-CM | POA: Diagnosis not present

## 2024-05-02 DIAGNOSIS — C7951 Secondary malignant neoplasm of bone: Secondary | ICD-10-CM | POA: Diagnosis not present

## 2024-05-02 DIAGNOSIS — C7800 Secondary malignant neoplasm of unspecified lung: Secondary | ICD-10-CM | POA: Diagnosis not present

## 2024-05-02 DIAGNOSIS — Z79899 Other long term (current) drug therapy: Secondary | ICD-10-CM | POA: Diagnosis not present

## 2024-05-02 DIAGNOSIS — C50412 Malignant neoplasm of upper-outer quadrant of left female breast: Secondary | ICD-10-CM | POA: Diagnosis not present

## 2024-05-02 DIAGNOSIS — Z170 Estrogen receptor positive status [ER+]: Secondary | ICD-10-CM | POA: Diagnosis not present

## 2024-05-02 DIAGNOSIS — C50912 Malignant neoplasm of unspecified site of left female breast: Secondary | ICD-10-CM

## 2024-05-02 DIAGNOSIS — Z5112 Encounter for antineoplastic immunotherapy: Secondary | ICD-10-CM | POA: Diagnosis not present

## 2024-05-02 DIAGNOSIS — C787 Secondary malignant neoplasm of liver and intrahepatic bile duct: Secondary | ICD-10-CM | POA: Diagnosis not present

## 2024-05-02 MED ORDER — PEGFILGRASTIM-CBQV 6 MG/0.6ML ~~LOC~~ SOSY
6.0000 mg | PREFILLED_SYRINGE | Freq: Once | SUBCUTANEOUS | Status: AC
  Administered 2024-05-02: 6 mg via SUBCUTANEOUS
  Filled 2024-05-02: qty 0.6

## 2024-05-03 ENCOUNTER — Ambulatory Visit
Admission: RE | Admit: 2024-05-03 | Discharge: 2024-05-03 | Disposition: A | Discharge status: Home / Self Care | Source: Ambulatory Visit | Attending: Radiation Oncology | Admitting: Radiation Oncology

## 2024-05-03 DIAGNOSIS — Z170 Estrogen receptor positive status [ER+]: Secondary | ICD-10-CM | POA: Insufficient documentation

## 2024-05-03 DIAGNOSIS — C787 Secondary malignant neoplasm of liver and intrahepatic bile duct: Secondary | ICD-10-CM | POA: Insufficient documentation

## 2024-05-03 DIAGNOSIS — C7951 Secondary malignant neoplasm of bone: Secondary | ICD-10-CM | POA: Insufficient documentation

## 2024-05-03 DIAGNOSIS — Z5112 Encounter for antineoplastic immunotherapy: Secondary | ICD-10-CM | POA: Insufficient documentation

## 2024-05-03 DIAGNOSIS — C7800 Secondary malignant neoplasm of unspecified lung: Secondary | ICD-10-CM | POA: Insufficient documentation

## 2024-05-03 DIAGNOSIS — C50412 Malignant neoplasm of upper-outer quadrant of left female breast: Secondary | ICD-10-CM | POA: Insufficient documentation

## 2024-05-03 DIAGNOSIS — Z510 Encounter for antineoplastic radiation therapy: Secondary | ICD-10-CM | POA: Insufficient documentation

## 2024-05-03 DIAGNOSIS — Z79899 Other long term (current) drug therapy: Secondary | ICD-10-CM | POA: Insufficient documentation

## 2024-05-08 DIAGNOSIS — Z510 Encounter for antineoplastic radiation therapy: Secondary | ICD-10-CM | POA: Diagnosis not present

## 2024-05-09 ENCOUNTER — Other Ambulatory Visit: Payer: Self-pay

## 2024-05-09 ENCOUNTER — Ambulatory Visit
Admission: RE | Admit: 2024-05-09 | Discharge: 2024-05-09 | Disposition: A | Source: Ambulatory Visit | Attending: Radiation Oncology | Admitting: Radiation Oncology

## 2024-05-09 DIAGNOSIS — C7951 Secondary malignant neoplasm of bone: Secondary | ICD-10-CM | POA: Diagnosis not present

## 2024-05-09 DIAGNOSIS — Z170 Estrogen receptor positive status [ER+]: Secondary | ICD-10-CM | POA: Diagnosis not present

## 2024-05-09 DIAGNOSIS — Z510 Encounter for antineoplastic radiation therapy: Secondary | ICD-10-CM | POA: Diagnosis not present

## 2024-05-09 DIAGNOSIS — C50412 Malignant neoplasm of upper-outer quadrant of left female breast: Secondary | ICD-10-CM | POA: Diagnosis not present

## 2024-05-09 LAB — RAD ONC ARIA SESSION SUMMARY
Course Elapsed Days: 0
Plan Fractions Treated to Date: 1
Plan Prescribed Dose Per Fraction: 4 Gy
Plan Total Fractions Prescribed: 10
Plan Total Prescribed Dose: 40 Gy
Reference Point Dosage Given to Date: 4 Gy
Reference Point Session Dosage Given: 4 Gy
Session Number: 1

## 2024-05-10 ENCOUNTER — Other Ambulatory Visit: Payer: Self-pay

## 2024-05-10 ENCOUNTER — Ambulatory Visit
Admission: RE | Admit: 2024-05-10 | Discharge: 2024-05-10 | Disposition: A | Discharge status: Home / Self Care | Source: Ambulatory Visit | Attending: Radiation Oncology | Admitting: Radiation Oncology

## 2024-05-10 DIAGNOSIS — Z170 Estrogen receptor positive status [ER+]: Secondary | ICD-10-CM | POA: Diagnosis not present

## 2024-05-10 DIAGNOSIS — C7951 Secondary malignant neoplasm of bone: Secondary | ICD-10-CM | POA: Diagnosis not present

## 2024-05-10 DIAGNOSIS — C7800 Secondary malignant neoplasm of unspecified lung: Secondary | ICD-10-CM | POA: Diagnosis not present

## 2024-05-10 DIAGNOSIS — C50412 Malignant neoplasm of upper-outer quadrant of left female breast: Secondary | ICD-10-CM | POA: Diagnosis not present

## 2024-05-10 DIAGNOSIS — C787 Secondary malignant neoplasm of liver and intrahepatic bile duct: Secondary | ICD-10-CM | POA: Diagnosis not present

## 2024-05-10 DIAGNOSIS — Z5112 Encounter for antineoplastic immunotherapy: Secondary | ICD-10-CM | POA: Diagnosis not present

## 2024-05-10 DIAGNOSIS — Z79899 Other long term (current) drug therapy: Secondary | ICD-10-CM | POA: Diagnosis not present

## 2024-05-10 DIAGNOSIS — Z510 Encounter for antineoplastic radiation therapy: Secondary | ICD-10-CM | POA: Diagnosis not present

## 2024-05-10 LAB — RAD ONC ARIA SESSION SUMMARY
Course Elapsed Days: 1
Plan Fractions Treated to Date: 2
Plan Prescribed Dose Per Fraction: 4 Gy
Plan Total Fractions Prescribed: 10
Plan Total Prescribed Dose: 40 Gy
Reference Point Dosage Given to Date: 8 Gy
Reference Point Session Dosage Given: 4 Gy
Session Number: 2

## 2024-05-11 ENCOUNTER — Other Ambulatory Visit: Payer: Self-pay

## 2024-05-11 ENCOUNTER — Ambulatory Visit
Admission: RE | Admit: 2024-05-11 | Discharge: 2024-05-11 | Disposition: A | Source: Ambulatory Visit | Attending: Radiation Oncology

## 2024-05-11 DIAGNOSIS — Z170 Estrogen receptor positive status [ER+]: Secondary | ICD-10-CM | POA: Diagnosis not present

## 2024-05-11 DIAGNOSIS — Z510 Encounter for antineoplastic radiation therapy: Secondary | ICD-10-CM | POA: Diagnosis not present

## 2024-05-11 DIAGNOSIS — C50412 Malignant neoplasm of upper-outer quadrant of left female breast: Secondary | ICD-10-CM | POA: Diagnosis not present

## 2024-05-11 DIAGNOSIS — C7951 Secondary malignant neoplasm of bone: Secondary | ICD-10-CM | POA: Diagnosis not present

## 2024-05-11 LAB — RAD ONC ARIA SESSION SUMMARY
Course Elapsed Days: 2
Plan Fractions Treated to Date: 3
Plan Prescribed Dose Per Fraction: 4 Gy
Plan Total Fractions Prescribed: 10
Plan Total Prescribed Dose: 40 Gy
Reference Point Dosage Given to Date: 12 Gy
Reference Point Session Dosage Given: 4 Gy
Session Number: 3

## 2024-05-14 ENCOUNTER — Other Ambulatory Visit: Payer: Self-pay

## 2024-05-14 ENCOUNTER — Ambulatory Visit
Admission: RE | Admit: 2024-05-14 | Discharge: 2024-05-14 | Disposition: A | Discharge status: Home / Self Care | Source: Ambulatory Visit | Attending: Radiation Oncology | Admitting: Radiation Oncology

## 2024-05-14 DIAGNOSIS — C7800 Secondary malignant neoplasm of unspecified lung: Secondary | ICD-10-CM | POA: Diagnosis not present

## 2024-05-14 DIAGNOSIS — Z5112 Encounter for antineoplastic immunotherapy: Secondary | ICD-10-CM | POA: Diagnosis not present

## 2024-05-14 DIAGNOSIS — C787 Secondary malignant neoplasm of liver and intrahepatic bile duct: Secondary | ICD-10-CM | POA: Diagnosis not present

## 2024-05-14 DIAGNOSIS — C50412 Malignant neoplasm of upper-outer quadrant of left female breast: Secondary | ICD-10-CM | POA: Diagnosis not present

## 2024-05-14 DIAGNOSIS — Z170 Estrogen receptor positive status [ER+]: Secondary | ICD-10-CM | POA: Diagnosis not present

## 2024-05-14 DIAGNOSIS — Z510 Encounter for antineoplastic radiation therapy: Secondary | ICD-10-CM | POA: Diagnosis not present

## 2024-05-14 DIAGNOSIS — Z79899 Other long term (current) drug therapy: Secondary | ICD-10-CM | POA: Diagnosis not present

## 2024-05-14 LAB — RAD ONC ARIA SESSION SUMMARY
Course Elapsed Days: 5
Plan Fractions Treated to Date: 4
Plan Prescribed Dose Per Fraction: 4 Gy
Plan Total Fractions Prescribed: 10
Plan Total Prescribed Dose: 40 Gy
Reference Point Dosage Given to Date: 16 Gy
Reference Point Session Dosage Given: 4 Gy
Session Number: 4

## 2024-05-15 ENCOUNTER — Other Ambulatory Visit: Payer: Self-pay

## 2024-05-15 ENCOUNTER — Ambulatory Visit
Admission: RE | Admit: 2024-05-15 | Discharge: 2024-05-15 | Disposition: A | Discharge status: Home / Self Care | Source: Ambulatory Visit | Attending: Radiation Oncology | Admitting: Radiation Oncology

## 2024-05-15 DIAGNOSIS — Z170 Estrogen receptor positive status [ER+]: Secondary | ICD-10-CM | POA: Diagnosis not present

## 2024-05-15 DIAGNOSIS — C7951 Secondary malignant neoplasm of bone: Secondary | ICD-10-CM | POA: Diagnosis not present

## 2024-05-15 DIAGNOSIS — C7800 Secondary malignant neoplasm of unspecified lung: Secondary | ICD-10-CM | POA: Diagnosis not present

## 2024-05-15 DIAGNOSIS — Z510 Encounter for antineoplastic radiation therapy: Secondary | ICD-10-CM | POA: Diagnosis not present

## 2024-05-15 DIAGNOSIS — C50412 Malignant neoplasm of upper-outer quadrant of left female breast: Secondary | ICD-10-CM | POA: Diagnosis not present

## 2024-05-15 LAB — RAD ONC ARIA SESSION SUMMARY
Course Elapsed Days: 6
Plan Fractions Treated to Date: 5
Plan Prescribed Dose Per Fraction: 4 Gy
Plan Total Fractions Prescribed: 10
Plan Total Prescribed Dose: 40 Gy
Reference Point Dosage Given to Date: 20 Gy
Reference Point Session Dosage Given: 4 Gy
Session Number: 5

## 2024-05-16 ENCOUNTER — Other Ambulatory Visit: Payer: Self-pay

## 2024-05-16 ENCOUNTER — Ambulatory Visit
Admission: RE | Admit: 2024-05-16 | Discharge: 2024-05-16 | Disposition: A | Discharge status: Home / Self Care | Source: Ambulatory Visit | Attending: Radiation Oncology | Admitting: Radiation Oncology

## 2024-05-16 DIAGNOSIS — Z170 Estrogen receptor positive status [ER+]: Secondary | ICD-10-CM | POA: Diagnosis not present

## 2024-05-16 DIAGNOSIS — Z510 Encounter for antineoplastic radiation therapy: Secondary | ICD-10-CM | POA: Diagnosis not present

## 2024-05-16 LAB — RAD ONC ARIA SESSION SUMMARY
Course Elapsed Days: 7
Plan Fractions Treated to Date: 6
Plan Prescribed Dose Per Fraction: 4 Gy
Plan Total Fractions Prescribed: 10
Plan Total Prescribed Dose: 40 Gy
Reference Point Dosage Given to Date: 24 Gy
Reference Point Session Dosage Given: 4 Gy
Session Number: 6

## 2024-05-17 ENCOUNTER — Other Ambulatory Visit: Payer: Self-pay

## 2024-05-17 ENCOUNTER — Ambulatory Visit
Admission: RE | Admit: 2024-05-17 | Discharge: 2024-05-17 | Disposition: A | Source: Ambulatory Visit | Attending: Radiation Oncology

## 2024-05-17 DIAGNOSIS — C7800 Secondary malignant neoplasm of unspecified lung: Secondary | ICD-10-CM | POA: Diagnosis not present

## 2024-05-17 DIAGNOSIS — C7951 Secondary malignant neoplasm of bone: Secondary | ICD-10-CM | POA: Diagnosis not present

## 2024-05-17 DIAGNOSIS — Z170 Estrogen receptor positive status [ER+]: Secondary | ICD-10-CM | POA: Diagnosis not present

## 2024-05-17 DIAGNOSIS — C787 Secondary malignant neoplasm of liver and intrahepatic bile duct: Secondary | ICD-10-CM | POA: Diagnosis not present

## 2024-05-17 DIAGNOSIS — Z5112 Encounter for antineoplastic immunotherapy: Secondary | ICD-10-CM | POA: Diagnosis not present

## 2024-05-17 DIAGNOSIS — Z79899 Other long term (current) drug therapy: Secondary | ICD-10-CM | POA: Diagnosis not present

## 2024-05-17 DIAGNOSIS — C50412 Malignant neoplasm of upper-outer quadrant of left female breast: Secondary | ICD-10-CM | POA: Diagnosis not present

## 2024-05-17 DIAGNOSIS — Z510 Encounter for antineoplastic radiation therapy: Secondary | ICD-10-CM | POA: Diagnosis not present

## 2024-05-17 LAB — RAD ONC ARIA SESSION SUMMARY
Course Elapsed Days: 8
Plan Fractions Treated to Date: 7
Plan Prescribed Dose Per Fraction: 4 Gy
Plan Total Fractions Prescribed: 10
Plan Total Prescribed Dose: 40 Gy
Reference Point Dosage Given to Date: 28 Gy
Reference Point Session Dosage Given: 4 Gy
Session Number: 7

## 2024-05-18 ENCOUNTER — Ambulatory Visit
Admission: RE | Admit: 2024-05-18 | Discharge: 2024-05-18 | Disposition: A | Discharge status: Home / Self Care | Source: Ambulatory Visit | Attending: Radiation Oncology | Admitting: Radiation Oncology

## 2024-05-18 ENCOUNTER — Other Ambulatory Visit: Payer: Self-pay

## 2024-05-18 DIAGNOSIS — Z510 Encounter for antineoplastic radiation therapy: Secondary | ICD-10-CM | POA: Diagnosis not present

## 2024-05-18 DIAGNOSIS — Z5112 Encounter for antineoplastic immunotherapy: Secondary | ICD-10-CM | POA: Diagnosis not present

## 2024-05-18 DIAGNOSIS — Z170 Estrogen receptor positive status [ER+]: Secondary | ICD-10-CM | POA: Diagnosis not present

## 2024-05-18 DIAGNOSIS — C787 Secondary malignant neoplasm of liver and intrahepatic bile duct: Secondary | ICD-10-CM | POA: Diagnosis not present

## 2024-05-18 DIAGNOSIS — C7951 Secondary malignant neoplasm of bone: Secondary | ICD-10-CM | POA: Diagnosis not present

## 2024-05-18 DIAGNOSIS — C7800 Secondary malignant neoplasm of unspecified lung: Secondary | ICD-10-CM | POA: Diagnosis not present

## 2024-05-18 DIAGNOSIS — C50412 Malignant neoplasm of upper-outer quadrant of left female breast: Secondary | ICD-10-CM | POA: Diagnosis not present

## 2024-05-18 DIAGNOSIS — Z79899 Other long term (current) drug therapy: Secondary | ICD-10-CM | POA: Diagnosis not present

## 2024-05-18 LAB — RAD ONC ARIA SESSION SUMMARY
Course Elapsed Days: 9
Plan Fractions Treated to Date: 8
Plan Prescribed Dose Per Fraction: 4 Gy
Plan Total Fractions Prescribed: 10
Plan Total Prescribed Dose: 40 Gy
Reference Point Dosage Given to Date: 32 Gy
Reference Point Session Dosage Given: 4 Gy
Session Number: 8

## 2024-05-21 ENCOUNTER — Inpatient Hospital Stay

## 2024-05-21 ENCOUNTER — Ambulatory Visit
Admission: RE | Admit: 2024-05-21 | Discharge: 2024-05-21 | Disposition: A | Discharge status: Home / Self Care | Source: Ambulatory Visit | Attending: Radiation Oncology | Admitting: Radiation Oncology

## 2024-05-21 ENCOUNTER — Ambulatory Visit

## 2024-05-21 ENCOUNTER — Inpatient Hospital Stay (HOSPITAL_BASED_OUTPATIENT_CLINIC_OR_DEPARTMENT_OTHER): Admitting: Hematology and Oncology

## 2024-05-21 ENCOUNTER — Other Ambulatory Visit: Payer: Self-pay

## 2024-05-21 ENCOUNTER — Ambulatory Visit
Admission: RE | Admit: 2024-05-21 | Discharge: 2024-05-21 | Disposition: A | Source: Ambulatory Visit | Attending: Radiation Oncology

## 2024-05-21 VITALS — BP 112/69 | HR 65 | Temp 98.0°F | Resp 16

## 2024-05-21 VITALS — BP 114/62 | HR 64 | Temp 97.5°F | Resp 17 | Wt 122.3 lb

## 2024-05-21 DIAGNOSIS — C50912 Malignant neoplasm of unspecified site of left female breast: Secondary | ICD-10-CM | POA: Diagnosis not present

## 2024-05-21 DIAGNOSIS — C787 Secondary malignant neoplasm of liver and intrahepatic bile duct: Secondary | ICD-10-CM | POA: Diagnosis not present

## 2024-05-21 DIAGNOSIS — Z510 Encounter for antineoplastic radiation therapy: Secondary | ICD-10-CM | POA: Diagnosis not present

## 2024-05-21 DIAGNOSIS — Z170 Estrogen receptor positive status [ER+]: Secondary | ICD-10-CM | POA: Diagnosis not present

## 2024-05-21 DIAGNOSIS — C50412 Malignant neoplasm of upper-outer quadrant of left female breast: Secondary | ICD-10-CM | POA: Diagnosis not present

## 2024-05-21 LAB — RAD ONC ARIA SESSION SUMMARY
Course Elapsed Days: 12
Plan Fractions Treated to Date: 9
Plan Prescribed Dose Per Fraction: 4 Gy
Plan Total Fractions Prescribed: 10
Plan Total Prescribed Dose: 40 Gy
Reference Point Dosage Given to Date: 36 Gy
Reference Point Session Dosage Given: 4 Gy
Session Number: 9

## 2024-05-21 LAB — CBC WITH DIFFERENTIAL (CANCER CENTER ONLY)
Abs Immature Granulocytes: 0.02 K/uL (ref 0.00–0.07)
Basophils Absolute: 0 K/uL (ref 0.0–0.1)
Basophils Relative: 1 %
Eosinophils Absolute: 0 K/uL (ref 0.0–0.5)
Eosinophils Relative: 0 %
HCT: 32.1 % — ABNORMAL LOW (ref 36.0–46.0)
Hemoglobin: 11.1 g/dL — ABNORMAL LOW (ref 12.0–15.0)
Immature Granulocytes: 0 %
Lymphocytes Relative: 12 %
Lymphs Abs: 0.7 K/uL (ref 0.7–4.0)
MCH: 32.9 pg (ref 26.0–34.0)
MCHC: 34.6 g/dL (ref 30.0–36.0)
MCV: 95.3 fL (ref 80.0–100.0)
Monocytes Absolute: 0.9 K/uL (ref 0.1–1.0)
Monocytes Relative: 16 %
Neutro Abs: 4 K/uL (ref 1.7–7.7)
Neutrophils Relative %: 71 %
Platelet Count: 299 K/uL (ref 150–400)
RBC: 3.37 MIL/uL — ABNORMAL LOW (ref 3.87–5.11)
RDW: 16 % — ABNORMAL HIGH (ref 11.5–15.5)
WBC Count: 5.6 K/uL (ref 4.0–10.5)
nRBC: 0 % (ref 0.0–0.2)

## 2024-05-21 LAB — CMP (CANCER CENTER ONLY)
ALT: 21 U/L (ref 0–44)
AST: 20 U/L (ref 15–41)
Albumin: 4.1 g/dL (ref 3.5–5.0)
Alkaline Phosphatase: 126 U/L (ref 38–126)
Anion gap: 4 — ABNORMAL LOW (ref 5–15)
BUN: 15 mg/dL (ref 6–20)
CO2: 28 mmol/L (ref 22–32)
Calcium: 9.7 mg/dL (ref 8.9–10.3)
Chloride: 105 mmol/L (ref 98–111)
Creatinine: 0.63 mg/dL (ref 0.44–1.00)
GFR, Estimated: >60 mL/min (ref >60)
Glucose, Bld: 87 mg/dL (ref 70–99)
Potassium: 4.5 mmol/L (ref 3.5–5.1)
Sodium: 137 mmol/L (ref 135–145)
Total Bilirubin: 0.2 mg/dL (ref 0.0–1.2)
Total Protein: 6.9 g/dL (ref 6.5–8.1)

## 2024-05-21 MED ORDER — DIPHENHYDRAMINE HCL 25 MG PO CAPS
50.0000 mg | ORAL_CAPSULE | Freq: Once | ORAL | Status: AC
  Administered 2024-05-21: 50 mg via ORAL
  Filled 2024-05-21: qty 2

## 2024-05-21 MED ORDER — DEXAMETHASONE SOD PHOSPHATE PF 10 MG/ML IJ SOLN
10.0000 mg | Freq: Once | INTRAMUSCULAR | Status: AC
  Administered 2024-05-21: 10 mg via INTRAVENOUS

## 2024-05-21 MED ORDER — SODIUM CHLORIDE 0.9 % IV SOLN
60.0000 mg/m2 | Freq: Once | INTRAVENOUS | Status: AC
  Administered 2024-05-21: 97 mg via INTRAVENOUS
  Filled 2024-05-21: qty 9.7

## 2024-05-21 MED ORDER — SODIUM CHLORIDE 0.9% FLUSH
10.0000 mL | INTRAVENOUS | Status: DC | PRN
  Administered 2024-05-21: 10 mL

## 2024-05-21 MED ORDER — SODIUM CHLORIDE 0.9 % IV SOLN: INTRAVENOUS | Status: DC

## 2024-05-21 MED ORDER — TRASTUZUMAB-ANNS CHEMO 150 MG IV SOLR
6.0000 mg/kg | Freq: Once | INTRAVENOUS | Status: AC
  Administered 2024-05-21: 336 mg via INTRAVENOUS
  Filled 2024-05-21: qty 16

## 2024-05-21 MED ORDER — ACETAMINOPHEN 325 MG PO TABS
650.0000 mg | ORAL_TABLET | Freq: Once | ORAL | Status: AC
  Administered 2024-05-21: 650 mg via ORAL
  Filled 2024-05-21: qty 2

## 2024-05-21 MED ORDER — SODIUM CHLORIDE 0.9 % IV SOLN
420.0000 mg | Freq: Once | INTRAVENOUS | Status: AC
  Administered 2024-05-21: 420 mg via INTRAVENOUS
  Filled 2024-05-21: qty 14

## 2024-05-21 NOTE — Patient Instructions (Signed)
 [facility] - A DEPT OF [facility]  Discharge Instructions: Thank you for choosing [facility] to provide your oncology and hematology care.   If you have a lab appointment with the Cancer Center, please go directly to the Cancer Center and check in at the registration area.   Wear comfortable clothing and clothing appropriate for easy access to any Portacath or PICC line.   We strive to give you quality time with your provider. You may need to reschedule your appointment if you arrive late (15 or more minutes).  Arriving late affects you and other patients whose appointments are after yours.  Also, if you miss three or more appointments without notifying the office, you may be dismissed from the clinic at the provider's discretion.      For prescription refill requests, have your pharmacy contact our office and allow 72 hours for refills to be completed.    Today you received the following chemotherapy and/or immunotherapy agents: Trastuzumab , Pertuzumab , Docetaxel       To help prevent nausea and vomiting after your treatment, we encourage you to take your nausea medication as directed.  BELOW ARE SYMPTOMS THAT SHOULD BE REPORTED IMMEDIATELY: *FEVER GREATER THAN 100.4 F (38 C) OR HIGHER *CHILLS OR SWEATING *NAUSEA AND VOMITING THAT IS NOT CONTROLLED WITH YOUR NAUSEA MEDICATION *UNUSUAL SHORTNESS OF BREATH *UNUSUAL BRUISING OR BLEEDING *URINARY PROBLEMS (pain or burning when urinating, or frequent urination) *BOWEL PROBLEMS (unusual diarrhea, constipation, pain near the anus) TENDERNESS IN MOUTH AND THROAT WITH OR WITHOUT PRESENCE OF ULCERS (sore throat, sores in mouth, or a toothache) UNUSUAL RASH, SWELLING OR PAIN  UNUSUAL VAGINAL DISCHARGE OR ITCHING   Items with * indicate a potential emergency and should be followed up as soon as possible or go to the Emergency Department if any problems should occur.  Please show the CHEMOTHERAPY  ALERT CARD or IMMUNOTHERAPY ALERT CARD at check-in to the Emergency Department and triage nurse.  Should you have questions after your visit or need to cancel or reschedule your appointment, please contact [facility]  Dept: [phone]  and follow the prompts.  Office hours are 8:00 a.m. to 4:30 p.m. Monday - Friday. Please note that voicemails left after 4:00 p.m. may not be returned until the following business day.  We are closed weekends and major holidays. You have access to a nurse at all times for urgent questions. Please call the main number to the clinic Dept: [phone] and follow the prompts.   For any non-urgent questions, you may also contact your provider using MyChart. We now offer e-Visits for anyone 77 and older to request care online for non-urgent symptoms. For details visit [email or web address].   Also download the MyChart app! Go to the app store, search MyChart, open the app, select [place], and log in with your MyChart username and password.

## 2024-05-21 NOTE — Progress Notes (Signed)
 [facility]  [phone] [phone]    ID: Paige Foster DOB: [birth year]  MR#: [id number]  RDW#:[id number]  Patient Care Team: Katheen Roselie Rockford, NP as PCP - General (Internal Medicine) Kate Lonni CROME, MD as PCP - Cardiology (Cardiology) Ebbie Cough, MD as Consulting Physician (General Surgery) Tobie Baptist, MD as Consulting Physician (Ophthalmology) Legrand Victory CROME MOULD, MD as Consulting Physician (Gastroenterology) Abigail Maude POUR (Optometry) Loretha Ash, MD as Consulting Physician (Hematology and Oncology) Izell Domino, MD as Attending Physician (Radiation Oncology) OTHER MD:   CHIEF COMPLAINT: 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  She has completed her eighth cycle of chemotherapy with docetaxel , Herceptin , and Perjeta  and is transitioning to maintenance therapy with antibodies only. She reports that her pain is controlled and she does not take pain medication.  Radiation therapy to her right shoulder is nearly complete, with the final session scheduled for tomorrow. She reports that the radiation has helped her symptoms.  No side effects from chemotherapy, including nausea, vomiting, fatigue, neuropathy, tingling, numbness, or diarrhea. She has not needed to use Imodium for diarrhea. She continues to work without issues and has not experienced any problems with urination, headaches, or changes in vision.  She inquired about the continuation of anastrozole , which she was previously taking, but it is confirmed that she is not currently taking it.  Rest of the pertinent 10 point ROS reviewed and neg.  HISTORY OF CURRENT ILLNESS: From the original intake  note:  Paige Foster has a prior history of left breast cancer, dating back to 2004. At that time she underwent a left mastectomy for stage 0 (noninvasive) breast cancer, with transverse rectus abdominis (TRAM) flap construction under Dr. Marcus. She also underwent a right breast reduction. She took tamoxifen for three years.  More recently she underwent bilateral diagnostic mammography with tomography and left breast ultrasonography at [facility] on 01/03/2018 showing: Breast Density Category B. There is an oval fat containing lesion in the left breast upper outer quadrant posterior depth. No other significant masses, calcifications, or other findings are seen in either breast. Sonographically, there is a 1.5 cm lesion in the left breast upper outer quadrant posterior depth. This lesion is of mixed echogenicity. This correlates as palpated and with mammography findings. Follow up was recommended.  Close follow-up was suggested.  She then presented with a non-tender mass in the left reconstructed breast on 08/30/2018. On physical exam, there is a hard palpable lump measuring 2.0 cm in the upper outer left reconstructed breast 10 cm from the expected location of a nipple. Sonography over this area demonstrates a 1.8 cm x 1.7 cm x 1.4 cm mass in the left breast at 2 o'clock posterior depth 10 cm from the nipple. This mass is of mixed echogenicity. This abnormality is increased in size and correlates as palpated and with prior mammography findings. Color flow imaging demonstrates that there is vascularity present. Elastography imaging assessment is intermediate. No significant abnormalities were seen sonographically in the left axilla.    Accordingly on 08/30/2018 she proceeded to biopsy of the left breast mass in question. The pathology from this procedure showed ([id number]): invasive ductal carcinoma, grade III. Prognostic indicators significant for: estrogen receptor, 100% positive with strong staining  intensity and  progesterone receptor, 0% negative. Proliferation marker Ki67 at 15%. HER2 positive (3+) by immunohistochemistry.  The patient's subsequent history is as detailed below.   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    Port-A-Cath in place 09/28/2018   Recurrent breast cancer, left [facility]) oncologist-- dr layla    dx 2004, noninvasive Stage 0 ----s/p left mastectomy w/ tram flap construction (and right breast reduction), taken Tamoxifen for 3 yrs;   08-30-2018 recurrent left cancer , Grade III,  cT1c,  ER positive, PR negative, HER-2 positive, invasive ductal carcinoma-- neoadjuvant chemo to start 09-26-2018   Renal artery stenosis    mild right external renal artery stenosis per duplex in epic 08-09-2013   Wears glasses     PAST SURGICAL HISTORY: Past Surgical History:  Procedure Laterality Date   BREAST LUMPECTOMY WITH RADIOACTIVE SEED LOCALIZATION Left 02/20/2019   Procedure: LEFT BREAST LUMPECTOMY WITH RADIOACTIVE SEED LOCALIZATION;  Surgeon: Ebbie Cough, MD;  Location: [facility] OR;  Service: General;  Laterality: Left;   BREAST SURGERY Left    Transflap   COLONOSCOPY     COLONOSCOPY     IR IMAGING GUIDED PORT INSERTION  11/25/2023   LEFT HEART CATH AND CORONARY ANGIOGRAPHY N/A 12/16/2023   Procedure: LEFT HEART CATH AND CORONARY ANGIOGRAPHY;  Surgeon: Jordan, Peter M, MD;  Location: [facility] INVASIVE CV LAB;  Service: Cardiovascular;  Laterality: N/A;   MASTECTOMY Left 2004   w/  TRAM flap construction and right breast augmentation with abdominoplasy   PARS PLANA VITRECTOMY Left 12/18/2018   Procedure: PARS PLANA VITRECTOMY WITH 25 GAUGE, ENDOLASER;  Surgeon: Tobie Baptist, MD;  Location: [facility] OR;  Service: Ophthalmology;  Laterality: Left;   PORTACATH PLACEMENT N/A 09/25/2018    Procedure: INSERTION PORT-A-CATH WITH ULTRASOUND;  Surgeon: Ebbie Cough, MD;  Location: [facility] ORS;  Service: General;  Laterality: N/A;   TUBAL LIGATION Bilateral yrs ago    FAMILY HISTORY: Family History  Problem Relation Age of Onset   Diabetes Mother    Hypertension Mother    Kidney disease Father    Colon cancer Neg Hx    Colon polyps Neg Hx    Gallbladder disease Neg Hx    Heart disease Neg Hx    Esophageal cancer Neg Hx    Stomach cancer Neg Hx    Rectal cancer Neg Hx   Tiernan's father died from unknown causes in his early 58's. Patients' mother died from diabetes complications at age 41. The patient has 1 sister. Patient denies anyone in her family having breast, ovarian, prostate, or pancreatic cancer.    GYNECOLOGIC HISTORY:  Patient's last menstrual period was 06/08/2007. 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 Her husband, Zachary, works at [employer]. Anastyn has two children, Fonda and Alm. Fonda lives with her, is 15, and it attending [employer] for a computer based degree. Alm lives with her, is 78, and recently graduated from [employer] with a degree in Pension Scheme Manager.  He works for [employer]. Rada has no grandchildren. She attends the [employer].   ADVANCED DIRECTIVES: In the  absence of any documents to the contrary her husband, Zachary, is automatically her healthcare power of attorney     HEALTH MAINTENANCE: Social History   Tobacco Use   Smoking status: Never    Passive exposure: Never   Smokeless tobacco: Never  Vaping Use   Vaping status: Never Used  Substance Use Topics   Alcohol use: Not Currently    Alcohol/week: 0.0 standard drinks of alcohol    Comment: Occassionally   Drug use: No    Colonoscopy: April 2022, danis  PAP: January 2022, N[facility]  Bone  density:  2017; -1.6, osteopenic   Allergies  Allergen Reactions   Bee Pollen Itching    Watery eyes and nose running    Current Outpatient Medications  Medication Sig Dispense Refill   ALPRAZolam  (XANAX ) 0.5 MG tablet Take 1 tablet (0.5 mg total) by mouth at bedtime as needed for anxiety. 30 tablet 0   amLODipine  (NORVASC ) 10 MG tablet Take 1 tablet by mouth daily. 90 tablet 2   atorvastatin  (LIPITOR) 20 MG tablet Take 1 tablet (20 mg total) by mouth daily. 90 tablet 3   brimonidine  (ALPHAGAN ) 0.2 % ophthalmic solution Place 1 drop into both eyes 3 (three) times daily.     dexamethasone  (DECADRON ) 4 MG tablet Take 2 tabs by mouth 2 times daily starting day before chemo. Then take 2 tabs daily for 2 days starting day after chemo. Take with food. 30 tablet 1   DOCEtaxel  (TAXOTERE ) 20 MG/ML CONC Inject 121 mg into the vein once.     dorzolamide -timolol  (COSOPT ) 2-0.5 % ophthalmic solution 1 drop 2 (two) times daily.     lidocaine -prilocaine  (EMLA ) cream Apply to affected area 1 hour before access prn 30 g 3   losartan  (COZAAR ) 50 MG tablet TAKE 1 TABLET(50 MG) BY MOUTH DAILY 90 tablet 3   magnesium  oxide (MAG-OX) 400 (240 Mg) MG tablet Take 1 tablet (400 mg total) by mouth 2 (two) times daily. (Patient not taking: Reported on 04/09/2024) 10 tablet 0   meloxicam  (MOBIC ) 7.5 MG tablet Take 1 tablet (7.5 mg total) by mouth daily. 30 tablet 0   omeprazole  (PRILOSEC) 20 MG capsule Take 1 capsule (20 mg total) by mouth daily. 90 capsule 1   ondansetron  (ZOFRAN ) 8 MG tablet Take 1 tablet (8 mg total) by mouth every 8 (eight) hours as needed for nausea or vomiting. 20 tablet 3   pegfilgrastim  (NEULASTA  ONPRO KIT) 6 MG/0.6ML injection Inject 6 mg into the skin once. Inject via provided programmed delivery device.     pertuzumab  (PERJETA ) 420 MG/14ML SOLN Inject 420 mg into the vein once.     prochlorperazine  (COMPAZINE ) 10 MG tablet Take 1 tablet (10 mg total) by mouth every 6 (six) hours as needed for  nausea or vomiting. 30 tablet 1   trastuzumab -anns 336 mg in sodium chloride  0.9 % 250 mL Inject 336 mg into the vein once.     zoledronic  acid (ZOMETA ) 4 MG/5ML injection Inject 4 mg into the vein once.     No current facility-administered medications for this visit.     OBJECTIVE: African-American woman who appears stated age  Vitals:   05/21/24 1239  BP: 114/62  Pulse: 64  Resp: 17  Temp: (!) 97.5 F (36.4 C)  SpO2: 100%        Body mass index is 20.99 kg/m.   Wt Readings from Last 3 Encounters:  05/21/24 122 lb 4.8 oz (55.5 kg)  04/30/24 119 lb 9.6 oz (54.3  kg)  04/13/24 119 lb (54 kg)   Physical Exam Constitutional:      Appearance: Normal appearance.  Cardiovascular:     Rate and Rhythm: Normal rate and regular rhythm.     Pulses: Normal pulses.     Heart sounds: Normal heart sounds.  Pulmonary:     Effort: Pulmonary effort is normal.     Breath sounds: Normal breath sounds.  Musculoskeletal:        General: No swelling.     Cervical back: Normal range of motion and neck supple. No rigidity.  Lymphadenopathy:     Cervical: No cervical adenopathy.  Skin:    General: Skin is warm and dry.  Neurological:     General: No focal deficit present.     Mental Status: She is alert.       LAB RESULTS:  CMP     Component Value Date/Time   NA 137 05/21/2024 1149   NA 140 10/12/2017 0950   K 4.5 05/21/2024 1149   CL 105 05/21/2024 1149   CO2 28 05/21/2024 1149   GLUCOSE 87 05/21/2024 1149   BUN 15 05/21/2024 1149   BUN 10 10/12/2017 0950   CREATININE 0.63 05/21/2024 1149   CALCIUM  9.7 05/21/2024 1149   PROT 6.9 05/21/2024 1149   PROT 7.5 10/12/2017 0950   ALBUMIN 4.1 05/21/2024 1149   ALBUMIN 4.3 10/12/2017 0950   AST 20 05/21/2024 1149   ALT 21 05/21/2024 1149   ALKPHOS 126 05/21/2024 1149   BILITOT 0.2 05/21/2024 1149   GFRNONAA >60 05/21/2024 1149   GFRAA >60 12/06/2019 1441   GFRAA >60 09/07/2018 1455   Lab Results  Component Value Date    WBC 5.6 05/21/2024   NEUTROABS 4.0 05/21/2024   HGB 11.1 (L) 05/21/2024   HCT 32.1 (L) 05/21/2024   MCV 95.3 05/21/2024   PLT 299 05/21/2024    Lab Results  Component Value Date   LABCA2 <4 08/30/2007    No components found for: [id number]  No results for input(s): INR in the last 168 hours.   Lab Results  Component Value Date   LABCA2 <4 08/30/2007    No results found for: CAN199  No results found for: CAN125  Lab Results  Component Value Date   CAN153 23.9 10/27/2023    Lab Results  Component Value Date   CA2729 27.9 10/27/2023    No components found for: HGQUANT  No results found for: CEA1, CEA / No results found for: CEA1, CEA  No results found for: AFPTUMOR  No results found for: CHROMOGRNA  No results found for: TOTALPROTELP, ALBUMINELP, A1GS, A2GS, BETS, BETA2SER, GAMS, MSPIKE, SPEI (this displays SPEP labs)  No results found for: KPAFRELGTCHN, LAMBDASER, KAPLAMBRATIO (kappa/lambda light chains)  No results found for: HGBA, HGBA2QUANT, HGBFQUANT, HGBSQUAN (Hemoglobinopathy evaluation)   Lab Results  Component Value Date   LDH 169 08/30/2007    Lab Results  Component Value Date   IRON 96 07/11/2023   TIBC 382 07/11/2023   IRONPCTSAT 25 07/11/2023   (Iron and TIBC)  Lab Results  Component Value Date   FERRITIN 261 07/11/2023    Urinalysis    Component Value Date/Time   COLORURINE STRAW (A) 12/15/2023 0809   APPEARANCEUR CLEAR 12/15/2023 0809   LABSPEC 1.010 12/15/2023 0809   PHURINE 5.0 12/15/2023 0809   GLUCOSEU NEGATIVE 12/15/2023 0809   HGBUR NEGATIVE 12/15/2023 0809   HGBUR large 04/09/2008 1548   BILIRUBINUR NEGATIVE 12/15/2023 0809   KETONESUR NEGATIVE 12/15/2023  0809   PROTEINUR NEGATIVE 12/15/2023 0809   UROBILINOGEN 0.2 04/09/2008 1548   NITRITE NEGATIVE 12/15/2023 0809   LEUKOCYTESUR NEGATIVE 12/15/2023 0809    STUDIES:  No results found.      ELIGIBLE  FOR AVAILABLE RESEARCH PROTOCOL: No  ASSESSMENT: 58 y.o. [place], [place] woman  (1) history of left-sided ductal carcinoma in situ 2004  (a) s/p left mastectomy with TRAM reconstruction  (b) status post tamoxifen x3 years  (2) left breast upper outer quadrant biopsy 08/30/2018 shows a clinical T1c N0 invasive ductal carcinoma, grade 3, estrogen receptor strongly positive, progesterone receptor negative, with HER-2 amplification, and and MIB-1 of 15%.   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    (b) chemotherapy discontinued after 4 cycles because of intercurrent eye surgery  (c) continued trastuzumab  and pertuzumab  to complete a year (last dose 09/06/2019)  (d) echocardiogram on 01/23/2019 that shows well preserved EF of 60-65%  (e) echocardiogram on 04/23/2019 shows EF of 60-65%  (f) echocardiogram 08/02/2019 shows an ejection fraction in the 60-65% range  (4) left lumpectomy 02/20/2019 showed a residual  ypT1c NX invasive ductal carcinoma, grade 2 with negative margins.    (5) adjuvant radiation: Radiation Treatment Dates: 04/12/2019 through 05/28/2019 Site Technique Total Dose (Gy) Dose per Fx (Gy) Completed Fx Beam Energies  Breast: CW_Lt 3D 50.4/50.4 1.8 28/28 6X, 10X  Breast: CW_Lt_SCV_PAB 3D 50.4/50.4 1.8 28/28 6X, 10X  Breast: CW_Lt_Bst Electron 10/10 2 5/5 6X, 10X   (6) anastrozole  started 06/26/2019  (a) DEXA scan at [facility] 12/24/2015 found a T score of -1.6 DEXA scan from mass June 2023 showed T score of -1.4 at L1-L2  (7) genetics testing 02/03/2019 through the Common Hereditary Cancers Panel offered by [facility] found no deleterious mutations in APC, ATM,  AXIN2, BARD1, BMPR1A, BRCA1, BRCA2, BRIP1, CDH1, CDKN2A (p14ARF), CDKN2A (p16INK4a), CKD4, CHEK2, CTNNA1, DICER1, EPCAM (Deletion/duplication testing only), GREM1 (promoter region deletion/duplication testing only), KIT, MEN1, MLH1, MSH2, MSH3, MSH6, MUTYH, NBN, NF1, NHTL1, PALB2, PDGFRA, PMS2, POLD1, POLE, PTEN, RAD50, RAD51C, RAD51D, RNF43, SDHB, SDHC, SDHD, SMAD4, SMARCA4. STK11, TP53, TSC1, TSC2, and VHL.  The following genes were evaluated for sequence changes only: SDHA and HOXB13 c.251G>A variant only.  (8) She had a CXR for unintentional weight loss,  this showed possible left lung pleural based mass and posterior right eighth rib met lesion. CT showed 3.8 x 5.5 x 5 cm left anterior chest wall mass along the mid axillary line with rib involvement and destruction and low-attenuation changes of the center indicating necrotic mass correlates with a neoplastic lesion likely metastatic disease. Liver is enlarged, inhomogeneous with multiple enhancing lesions throughout the right and left lobe of the liver consistent with extensive numerous metastatic liver lesions. Liver appears completely replaced by metastatic disease. Lytic metastatic bone lesions involving the right anterior superior iliac crest, left iliac bone, right inferior pubic ramus, left iliac bone, and L4 vertebral body. Suggestive of lytic bone changes involving the upper thoracic vertebral bodies T1-T2.  PLAN:  Assessment & Plan Metastatic HER2-positive breast cancer HER2-positive breast cancer with metastasis, stable with no new growth. Tumor burden persists, but lesions are smaller or unchanged. Completed eighth cycle of docetaxel , Herceptin , and Perjeta . Transitioning to maintenance therapy  with Herceptin  and Perjeta  only. Radiation therapy to the right shoulder concluding. No significant side effects from chemotherapy. - Administer Herceptin  and Perjeta  maintenance therapy. - Schedule CT scan for end of November to monitor disease  status. - Ensure follow-up appointments for antibody treatments are scheduled.  Time spent: 30 min  Amber Stalls, MD  Medical Oncology and Hematology [facility] [address] [place], [place] [address] Tel. [phone]    Fax. [phone]  *Total Encounter Time as defined by the Centers for Medicare and Medicaid Services includes, in addition to the face-to-face time of a patient visit (documented in the note above) non-face-to-face time: obtaining and reviewing outside history, ordering and reviewing medications, tests or procedures, care coordination (communications with other health care professionals or caregivers) and documentation in the medical record.

## 2024-05-22 ENCOUNTER — Ambulatory Visit
Admission: RE | Admit: 2024-05-22 | Discharge: 2024-05-22 | Disposition: A | Discharge status: Home / Self Care | Source: Ambulatory Visit | Attending: Radiation Oncology | Admitting: Radiation Oncology

## 2024-05-22 ENCOUNTER — Other Ambulatory Visit: Payer: Self-pay

## 2024-05-22 ENCOUNTER — Ambulatory Visit

## 2024-05-22 DIAGNOSIS — Z510 Encounter for antineoplastic radiation therapy: Secondary | ICD-10-CM | POA: Diagnosis not present

## 2024-05-22 LAB — RAD ONC ARIA SESSION SUMMARY
Course Elapsed Days: 13
Plan Fractions Treated to Date: 10
Plan Prescribed Dose Per Fraction: 4 Gy
Plan Total Fractions Prescribed: 10
Plan Total Prescribed Dose: 40 Gy
Reference Point Dosage Given to Date: 40 Gy
Reference Point Session Dosage Given: 4 Gy
Session Number: 10

## 2024-05-23 ENCOUNTER — Inpatient Hospital Stay

## 2024-05-23 ENCOUNTER — Other Ambulatory Visit: Payer: Self-pay

## 2024-05-23 NOTE — Radiation Completion Notes (Signed)
 Patient Name: Paige Foster, Paige Foster MRN: [id number] Date of Birth: 02-14-1966 Referring Physician: AMBER STALLS, M.D. Date of Service: 2024-05-23 Radiation Oncologist: Lauraine Golden, M.D. [facility]                             RADIATION ONCOLOGY END OF TREATMENT NOTE     Diagnosis: C79.51 Secondary malignant neoplasm of bone Staging on 2018-08-30: Malignant neoplasm of upper-outer quadrant of left breast in female, estrogen receptor positive (HCC) T=cT1c, N=cN0, M=cM0 Staging on 2018-09-29: Recurrent breast cancer, left (HCC) T=cT2, N=cN0, M=cM0 Intent: Palliative     ==========DELIVERED PLANS==========  First Treatment Date: 2024-05-09 Last Treatment Date: 2024-05-22   Plan Name: Chest_R_Scap Site: Scapula, Right Technique: IMRT Mode: Photon Dose Per Fraction: 4 Gy Prescribed Dose (Delivered / Prescribed): 40 Gy / 40 Gy Prescribed Fxs (Delivered / Prescribed): 10 / 10     ==========ON TREATMENT VISIT DATES========== 2024-05-14, 2024-05-21     ==========UPCOMING VISITS========== 08/13/2024 [facility] INFUSION 3HR (180) [facility] INFUSION  08/13/2024 [facility] EST PT 20 Crawford Jacobsen [place], NP  07/12/2024 [facility] LAB [facility] LAB  07/12/2024 [facility] EST PT 15 Stalls Amber, MD  07/02/2024 [facility] EST PT 15 Odean Potts, MD  07/02/2024 [facility] INFUSION 3HR (180) [facility] INFUSION  06/18/2024 [facility] IM[facility]ING CT CHEST ABD/PELVIS W CONTRAST [facility]-CT 2  06/14/2024 [facility] ONC FOLLOW UP 20 Golden Lauraine, MD  06/11/2024 [facility] EST PT 20 Crawford Jacobsen [place], NP  06/11/2024 [facility] INFUSION 3HR (180) [facility] INFUS[facility]  06/11/2024 [facility] PORT FLUSH W/LAB [facility] FLUSH  05/24/2024 [facility] INJECTION [facility] FLUSH        ==========APPENDIX - ON TREATMENT VISIT NOTES==========   See  weekly On Treatment Notes in Epic for details in the Media tab (listed as Progress notes on the On Treatment Visit Dates listed above).

## 2024-05-24 ENCOUNTER — Inpatient Hospital Stay

## 2024-05-24 VITALS — BP 106/58 | HR 63 | Temp 99.0°F | Resp 17

## 2024-05-24 DIAGNOSIS — C50912 Malignant neoplasm of unspecified site of left female breast: Secondary | ICD-10-CM

## 2024-05-24 DIAGNOSIS — Z170 Estrogen receptor positive status [ER+]: Secondary | ICD-10-CM

## 2024-05-24 DIAGNOSIS — Z510 Encounter for antineoplastic radiation therapy: Secondary | ICD-10-CM | POA: Diagnosis not present

## 2024-05-24 MED ORDER — PEGFILGRASTIM-CBQV 6 MG/0.6ML ~~LOC~~ SOSY
6.0000 mg | PREFILLED_SYRINGE | Freq: Once | SUBCUTANEOUS | Status: AC
  Administered 2024-05-24: 6 mg via SUBCUTANEOUS
  Filled 2024-05-24: qty 0.6

## 2024-06-08 DIAGNOSIS — H401131 Primary open-angle glaucoma, bilateral, mild stage: Secondary | ICD-10-CM | POA: Diagnosis not present

## 2024-06-11 ENCOUNTER — Inpatient Hospital Stay

## 2024-06-11 ENCOUNTER — Inpatient Hospital Stay: Admitting: Adult Health

## 2024-06-11 ENCOUNTER — Encounter: Payer: Self-pay | Admitting: Adult Health

## 2024-06-11 ENCOUNTER — Inpatient Hospital Stay: Attending: Hematology and Oncology

## 2024-06-11 VITALS — BP 114/67 | HR 70 | Temp 98.1°F | Resp 17 | Wt 123.7 lb

## 2024-06-11 VITALS — BP 121/74 | HR 72 | Temp 98.2°F | Resp 17

## 2024-06-11 DIAGNOSIS — Z79899 Other long term (current) drug therapy: Secondary | ICD-10-CM | POA: Insufficient documentation

## 2024-06-11 DIAGNOSIS — Z170 Estrogen receptor positive status [ER+]: Secondary | ICD-10-CM

## 2024-06-11 DIAGNOSIS — C7951 Secondary malignant neoplasm of bone: Secondary | ICD-10-CM | POA: Insufficient documentation

## 2024-06-11 DIAGNOSIS — Z09 Encounter for follow-up examination after completed treatment for conditions other than malignant neoplasm: Secondary | ICD-10-CM

## 2024-06-11 DIAGNOSIS — Z5111 Encounter for antineoplastic chemotherapy: Secondary | ICD-10-CM | POA: Diagnosis not present

## 2024-06-11 DIAGNOSIS — C50412 Malignant neoplasm of upper-outer quadrant of left female breast: Secondary | ICD-10-CM

## 2024-06-11 DIAGNOSIS — C50912 Malignant neoplasm of unspecified site of left female breast: Secondary | ICD-10-CM

## 2024-06-11 DIAGNOSIS — Z9013 Acquired absence of bilateral breasts and nipples: Secondary | ICD-10-CM | POA: Diagnosis not present

## 2024-06-11 DIAGNOSIS — C787 Secondary malignant neoplasm of liver and intrahepatic bile duct: Secondary | ICD-10-CM | POA: Insufficient documentation

## 2024-06-11 LAB — CMP (CANCER CENTER ONLY)
ALT: 22 U/L (ref 0–44)
AST: 24 U/L (ref 15–41)
Albumin: 4 g/dL (ref 3.5–5.0)
Alkaline Phosphatase: 113 U/L (ref 38–126)
Anion gap: 6 (ref 5–15)
BUN: 12 mg/dL (ref 6–20)
CO2: 26 mmol/L (ref 22–32)
Calcium: 9.3 mg/dL (ref 8.9–10.3)
Chloride: 105 mmol/L (ref 98–111)
Creatinine: 0.56 mg/dL (ref 0.44–1.00)
GFR, Estimated: >60 mL/min (ref >60)
Glucose, Bld: 83 mg/dL (ref 70–99)
Potassium: 3.9 mmol/L (ref 3.5–5.1)
Sodium: 137 mmol/L (ref 135–145)
Total Bilirubin: 0.3 mg/dL (ref 0.0–1.2)
Total Protein: 6.4 g/dL — ABNORMAL LOW (ref 6.5–8.1)

## 2024-06-11 LAB — CBC WITH DIFFERENTIAL (CANCER CENTER ONLY)
Abs Immature Granulocytes: 0.02 K/uL (ref 0.00–0.07)
Basophils Absolute: 0 K/uL (ref 0.0–0.1)
Basophils Relative: 0 %
Eosinophils Absolute: 0 K/uL (ref 0.0–0.5)
Eosinophils Relative: 0 %
HCT: 30.3 % — ABNORMAL LOW (ref 36.0–46.0)
Hemoglobin: 10.3 g/dL — ABNORMAL LOW (ref 12.0–15.0)
Immature Granulocytes: 0 %
Lymphocytes Relative: 11 %
Lymphs Abs: 0.7 K/uL (ref 0.7–4.0)
MCH: 32.1 pg (ref 26.0–34.0)
MCHC: 34 g/dL (ref 30.0–36.0)
MCV: 94.4 fL (ref 80.0–100.0)
Monocytes Absolute: 0.7 K/uL (ref 0.1–1.0)
Monocytes Relative: 12 %
Neutro Abs: 5 K/uL (ref 1.7–7.7)
Neutrophils Relative %: 77 %
Platelet Count: 282 K/uL (ref 150–400)
RBC: 3.21 MIL/uL — ABNORMAL LOW (ref 3.87–5.11)
RDW: 15.9 % — ABNORMAL HIGH (ref 11.5–15.5)
WBC Count: 6.5 K/uL (ref 4.0–10.5)
nRBC: 0 % (ref 0.0–0.2)

## 2024-06-11 MED ORDER — ACETAMINOPHEN 325 MG PO TABS
650.0000 mg | ORAL_TABLET | Freq: Once | ORAL | Status: AC
  Administered 2024-06-11: 650 mg via ORAL
  Filled 2024-06-11: qty 2

## 2024-06-11 MED ORDER — SODIUM CHLORIDE 0.9 % IV SOLN: INTRAVENOUS | Status: DC

## 2024-06-11 MED ORDER — ALPRAZOLAM 0.5 MG PO TABS
0.5000 mg | ORAL_TABLET | Freq: Every evening | ORAL | 0 refills | Status: DC | PRN
Start: 2024-06-11 — End: 2024-06-11

## 2024-06-11 MED ORDER — ALPRAZOLAM 0.5 MG PO TABS: 0.5000 mg | ORAL_TABLET | Freq: Every evening | ORAL | 0 refills | Status: AC | PRN

## 2024-06-11 MED ORDER — SODIUM CHLORIDE 0.9 % IV SOLN
420.0000 mg | Freq: Once | INTRAVENOUS | Status: AC
  Administered 2024-06-11: 420 mg via INTRAVENOUS
  Filled 2024-06-11: qty 14

## 2024-06-11 MED ORDER — ZOLEDRONIC ACID 4 MG/100ML IV SOLN
4.0000 mg | Freq: Once | INTRAVENOUS | Status: AC
  Administered 2024-06-11: 4 mg via INTRAVENOUS
  Filled 2024-06-11: qty 100

## 2024-06-11 MED ORDER — DIPHENHYDRAMINE HCL 25 MG PO CAPS
50.0000 mg | ORAL_CAPSULE | Freq: Once | ORAL | Status: AC
  Administered 2024-06-11: 50 mg via ORAL
  Filled 2024-06-11: qty 2

## 2024-06-11 MED ORDER — TRASTUZUMAB-ANNS CHEMO 150 MG IV SOLR
6.0000 mg/kg | Freq: Once | INTRAVENOUS | Status: AC
  Administered 2024-06-11: 336 mg via INTRAVENOUS
  Filled 2024-06-11: qty 16

## 2024-06-11 NOTE — Progress Notes (Unsigned)
 [facility] Cancer Follow up:    Katheen Roselie Rockford, NP [address] [place] [address]   DIAGNOSIS: Cancer Staging  Malignant neoplasm of upper-outer quadrant of left breast in female, estrogen receptor positive (HCC) Staging form: Breast, AJCC 8th Edition - Clinical stage from 08/30/2018: Stage IA (cT1c, cN0, cM0, G3, ER+, PR-, HER2+) - Signed by Crawford Morna Pickle, NP on 01/31/2019 Histologic grading system: 3 grade system  Recurrent breast cancer, left (HCC) Staging form: Breast, AJCC 8th Edition - Clinical: Stage IIA (rcT2, rcN0, rcM0, G3, ER+, PR-, HER2+) - Unsigned Stage prefix: Recurrence Neoadjuvant therapy: No Histologic grading system: 3 grade system    SUMMARY OF ONCOLOGIC HISTORY: 58 y.o. [place], [place] woman   (1) history of left-sided ductal carcinoma in situ 2004             (a) s/p left mastectomy with TRAM reconstruction             (b) status post tamoxifen x3 years   (2) left breast upper outer quadrant biopsy 08/30/2018 shows a clinical T1c N0 invasive ductal carcinoma, grade 3, estrogen receptor strongly positive, progesterone receptor negative, with HER-2 amplification, and and MIB-1 of 15%.              (a) staging CT scan of the chest with contrast 09/14/2018 showed no evidence of metastatic disease   (3) neoadjuvant chemotherapy consisting of carboplatin , docetaxel , trastuzumab  and Pertuzumab  starting 09/26/2018, repeated every 21 days x 6, last dose 09/06/2019             (a) Docetaxel  changed to Gemcitabine  starting with cycle 4 due to lacrimal duct stenosis, and neuropathy.               (b) chemotherapy discontinued after 4 cycles because of intercurrent eye surgery             (c) continued trastuzumab  and pertuzumab  to complete a year (last dose 09/06/2019)             (d) echocardiogram on 01/23/2019 that shows well preserved EF of 60-65%             (e) echocardiogram on 04/23/2019 shows EF of 60-65%             (f)  echocardiogram 08/02/2019 shows an ejection fraction in the 60-65% range   (4) left lumpectomy 02/20/2019 showed a residual  ypT1c NX invasive ductal carcinoma, grade 2 with negative margins.     (5) adjuvant radiation: Radiation Treatment Dates: 04/12/2019 through 05/28/2019 Site Technique Total Dose (Gy) Dose per Fx (Gy) Completed Fx Beam Energies  Breast: CW_Lt 3D 50.4/50.4 1.8 28/28 6X, 10X  Breast: CW_Lt_SCV_PAB 3D 50.4/50.4 1.8 28/28 6X, 10X  Breast: CW_Lt_Bst Electron 10/10 2 5/5 6X, 10X    (6) anastrozole  started 06/26/2019             (a) DEXA scan at [facility] 12/24/2015 found a T score of -1.6 DEXA scan from mass June 2023 showed T score of -1.4 at L1-L2   (7) genetics testing 02/03/2019 through the Common Hereditary Cancers Panel offered by [facility] found no deleterious mutations in APC, ATM, AXIN2, BARD1, BMPR1A, BRCA1, BRCA2, BRIP1, CDH1, CDKN2A (p14ARF), CDKN2A (p16INK4a), CKD4, CHEK2, CTNNA1, DICER1, EPCAM (Deletion/duplication testing only), GREM1 (promoter region deletion/duplication testing only), KIT, MEN1, MLH1, MSH2, MSH3, MSH6, MUTYH, NBN, NF1, NHTL1, PALB2, PDGFRA, PMS2, POLD1, POLE, PTEN, RAD50, RAD51C, RAD51D, RNF43, SDHB, SDHC, SDHD, SMAD4, SMARCA4. STK11, TP53, TSC1, TSC2, and VHL.  The following genes were evaluated for sequence changes only: SDHA and HOXB13 c.251G>A variant only.   (8) She had a CXR for unintentional weight loss,  this showed possible left lung pleural based mass and posterior right eighth rib met lesion. CT showed 3.8 x 5.5 x 5 cm left anterior chest wall mass along the mid axillary line with rib involvement and destruction and low-attenuation changes of the center indicating necrotic mass correlates with a neoplastic lesion likely metastatic disease. Liver is enlarged, inhomogeneous with multiple enhancing lesions throughout the right and left lobe of the liver consistent with extensive numerous metastatic liver lesions. Liver appears completely replaced by  metastatic disease. Lytic metastatic bone lesions involving the right anterior superior iliac crest, left iliac bone, right inferior pubic ramus, left iliac bone, and L4 vertebral body. Suggestive of lytic bone changes involving the upper thoracic vertebral bodies T1-T2.  (9) THP x 8 cycles 12/02/2023-05/11/2024, then Herceptin /Perjeta  every 21 days  (10) Radiation to scapula 05/09/2024-05/22/2024  CURRENT THERAPY: Herceptin  Perjeta   INTERVAL HISTORY:  Discussed the use of AI scribe software for clinical note transcription with the patient, who gave verbal consent to proceed.  History of Present Illness Paige Foster is a 58 year old female who presents for follow-up and evaluation prior to receiving Herceptin  and Perjeta  therapy.  She experiences insomnia for four days, potentially due to work-related stress, and has not taken Xanax  recently. A refill request is pending. An echocardiogram on April 11, 2024, shows a left ventricular ejection fraction of 60-65% with normal global longitudinal strain. She has no pain, cough, or shortness of breath. Bowel movements are normal. Scans are scheduled for next week, and a follow-up appointment is on December 8th. Her next echocardiogram is due next month. She inquires about her fingernails, expecting improvement over time.     Patient Active Problem List   Diagnosis Date Noted   Demand ischemia (HCC) 12/17/2023   Chronic diastolic heart failure (HCC) 12/16/2023   NSTEMI (non-ST elevated myocardial infarction) (HCC) 12/15/2023   Hypokalemia 12/15/2023   Metastasis to bone (HCC) 10/31/2023   Metastasis to liver (HCC) 10/31/2023   Mass of lower lobe of left lung 10/18/2023   Elevated LFTs 10/18/2023   Degeneration of intervertebral disc of lumbar region with discogenic back pain and lower extremity pain 07/26/2023   Primary osteoarthritis of right hip 07/26/2023   Pain of right hip 07/26/2023   Bulging eyes 07/04/2023   Use of anastrozole   (Arimidex ) 07/04/2023   Pure hypercholesterolemia 11/17/2021   Anemia 11/17/2021   Osteopenia 08/16/2019   Genetic testing 02/05/2019   Recurrent breast cancer, left (HCC) 09/07/2018   Malignant neoplasm of upper-outer quadrant of left breast in female, estrogen receptor positive (HCC) 09/06/2018   HTN (hypertension) 07/10/2013   HEMORRHOIDS-INTERNAL 04/23/2009   History of colonic polyps 04/23/2009   HEMORRHOID, THROMBOSED 03/12/2009    is allergic to bee pollen.  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    Port-A-Cath in place 09/28/2018   Recurrent breast cancer, left [facility]) oncologist-- dr layla    dx 2004, noninvasive Stage 0 ----s/p left mastectomy w/ tram flap construction (and right breast reduction), taken Tamoxifen for 3 yrs;   08-30-2018 recurrent left cancer , Grade III,  cT1c,  ER  positive, PR negative, HER-2 positive, invasive ductal carcinoma-- neoadjuvant chemo to start 09-26-2018   Renal artery stenosis    mild right external renal artery stenosis per duplex in epic 08-09-2013   Wears glasses     SURGICAL HISTORY: Past Surgical History:  Procedure Laterality Date   BREAST LUMPECTOMY WITH RADIOACTIVE SEED LOCALIZATION Left 02/20/2019   Procedure: LEFT BREAST LUMPECTOMY WITH RADIOACTIVE SEED LOCALIZATION;  Surgeon: Ebbie Cough, MD;  Location: [facility] OR;  Service: General;  Laterality: Left;   BREAST SURGERY Left    Transflap   COLONOSCOPY     COLONOSCOPY     IR IMAGING GUIDED PORT INSERTION  11/25/2023   LEFT HEART CATH AND CORONARY ANGIOGRAPHY N/A 12/16/2023   Procedure: LEFT HEART CATH AND CORONARY ANGIOGRAPHY;  Surgeon: Jordan, Peter M, MD;  Location: [facility] INVASIVE CV LAB;  Service: Cardiovascular;  Laterality: N/A;   MASTECTOMY Left 2004   w/  TRAM flap construction and  right breast augmentation with abdominoplasy   PARS PLANA VITRECTOMY Left 12/18/2018   Procedure: PARS PLANA VITRECTOMY WITH 25 GAUGE, ENDOLASER;  Surgeon: Tobie Baptist, MD;  Location: [facility] OR;  Service: Ophthalmology;  Laterality: Left;   PORTACATH PLACEMENT N/A 09/25/2018   Procedure: INSERTION PORT-A-CATH WITH ULTRASOUND;  Surgeon: Ebbie Cough, MD;  Location: [facility] ORS;  Service: General;  Laterality: N/A;   TUBAL LIGATION Bilateral yrs ago    SOCIAL HISTORY: Social History   Socioeconomic History   Marital status: Married    Spouse name: Not on file   Number of children: 2   Years of education: Not on file   Highest education level: Not on file  Occupational History   Occupation: [employer]  Tobacco Use   Smoking status: Never    Passive exposure: Never   Smokeless tobacco: Never  Vaping Use   Vaping status: Never Used  Substance and Sexual Activity   Alcohol use: Not Currently    Alcohol/week: 0.0 standard drinks of alcohol    Comment: Occassionally   Drug use: No   Sexual activity: Not on file  Other Topics Concern   Not on file  Social History Narrative   Not on file   Social Drivers of Health   Financial Resource Strain: Low Risk  (06/11/2024)   Overall Financial Resource Strain (CARDIA)    Difficulty of Paying Living Expenses: Not hard at all  Food Insecurity: No Food Insecurity (06/11/2024)   Hunger Vital Sign    Worried About Running Out of Food in the Last Year: Never true    Ran Out of Food in the Last Year: Never true  Transportation Needs: No Transportation Needs (06/11/2024)   PRAPARE - Administrator, Civil Service (Medical): No    Lack of Transportation (Non-Medical): No  Physical Activity: Insufficiently Active (06/11/2024)   Exercise Vital Sign    Days of Exercise per Week: 3 days    Minutes of Exercise per Session: 40 min  Stress: No Stress Concern Present (06/11/2024)   [employer] of Occupational Health - Occupational Stress  Questionnaire    Feeling of Stress: Not at all  Social Connections: Moderately Integrated (06/11/2024)   Social Connection and Isolation Panel    Frequency of Communication with Friends and Family: More than three times a week    Frequency of Social Gatherings with Friends and Family: More than three times a week    Attends Religious Services: 1 to 4 times per year    Active Member of Clubs or Organizations: No  Attends Banker Meetings: Never    Marital Status: Married  Catering Manager Violence: Not At Risk (06/11/2024)   Humiliation, Afraid, Rape, and Kick questionnaire    Fear of Current or Ex-Partner: No    Emotionally Abused: No    Physically Abused: No    Sexually Abused: No    FAMILY HISTORY: Family History  Problem Relation Age of Onset   Diabetes Mother    Hypertension Mother    Kidney disease Father    Colon cancer Neg Hx    Colon polyps Neg Hx    Gallbladder disease Neg Hx    Heart disease Neg Hx    Esophageal cancer Neg Hx    Stomach cancer Neg Hx    Rectal cancer Neg Hx     Review of Systems  Constitutional:  Negative for appetite change, chills, fatigue, fever and unexpected weight change.  HENT:   Negative for hearing loss, lump/mass and trouble swallowing.   Eyes:  Negative for eye problems and icterus.  Respiratory:  Negative for chest tightness, cough and shortness of breath.   Cardiovascular:  Negative for chest pain, leg swelling and palpitations.  Gastrointestinal:  Negative for abdominal distention, abdominal pain, constipation, diarrhea, nausea and vomiting.  Endocrine: Negative for hot flashes.  Genitourinary:  Negative for difficulty urinating.   Musculoskeletal:  Negative for arthralgias.  Skin:  Negative for itching and rash.  Neurological:  Negative for dizziness, extremity weakness, headaches and numbness.  Hematological:  Negative for adenopathy. Does not bruise/bleed easily.  Psychiatric/Behavioral:  Positive for sleep  disturbance. Negative for depression. The patient is not nervous/anxious.       PHYSICAL EXAMINATION    Vitals:   06/11/24 1041  BP: 114/67  Pulse: 70  Resp: 17  Temp: 98.1 F (36.7 C)  SpO2: 100%    Physical Exam Constitutional:      General: She is not in acute distress.    Appearance: Normal appearance. She is not toxic-appearing.  HENT:     Head: Normocephalic and atraumatic.     Mouth/Throat:     Mouth: Mucous membranes are moist.     Pharynx: Oropharynx is clear. No oropharyngeal exudate or posterior oropharyngeal erythema.  Eyes:     General: No scleral icterus. Cardiovascular:     Rate and Rhythm: Normal rate and regular rhythm.     Pulses: Normal pulses.     Heart sounds: Normal heart sounds.  Pulmonary:     Effort: Pulmonary effort is normal.     Breath sounds: Normal breath sounds.  Abdominal:     General: Abdomen is flat. Bowel sounds are normal. There is no distension.     Palpations: Abdomen is soft.     Tenderness: There is no abdominal tenderness.  Musculoskeletal:        General: No swelling.     Cervical back: Neck supple.  Lymphadenopathy:     Cervical: No cervical adenopathy.  Skin:    General: Skin is warm and dry.     Findings: No rash.  Neurological:     General: No focal deficit present.     Mental Status: She is alert.  Psychiatric:        Mood and Affect: Mood normal.        Behavior: Behavior normal.     LABORATORY DATA:  CBC    Component Value Date/Time   WBC 6.5 06/11/2024 1018   WBC 11.8 (H) 12/17/2023 0837   RBC 3.21 (L) 06/11/2024 1018  HGB 10.3 (L) 06/11/2024 1018   HGB 13.4 10/12/2017 0950   HGB 12.7 08/30/2007 1204   HCT 30.3 (L) 06/11/2024 1018   HCT 40.6 10/12/2017 0950   HCT 38.6 08/30/2007 1204   PLT 282 06/11/2024 1018   PLT 307 10/12/2017 0950   MCV 94.4 06/11/2024 1018   MCV 93 10/12/2017 0950   MCV 90.2 08/30/2007 1204   MCH 32.1 06/11/2024 1018   MCHC 34.0 06/11/2024 1018   RDW 15.9 (H)  06/11/2024 1018   RDW 13.4 10/12/2017 0950   RDW 12.4 08/30/2007 1204   LYMPHSABS 0.7 06/11/2024 1018   LYMPHSABS 2.2 08/30/2007 1204   MONOABS 0.7 06/11/2024 1018   MONOABS 0.4 08/30/2007 1204   EOSABS 0.0 06/11/2024 1018   EOSABS 0.1 08/30/2007 1204   BASOSABS 0.0 06/11/2024 1018   BASOSABS 0.0 08/30/2007 1204    CMP     Component Value Date/Time   NA 137 05/21/2024 1149   NA 140 10/12/2017 0950   K 4.5 05/21/2024 1149   CL 105 05/21/2024 1149   CO2 28 05/21/2024 1149   GLUCOSE 87 05/21/2024 1149   BUN 15 05/21/2024 1149   BUN 10 10/12/2017 0950   CREATININE 0.63 05/21/2024 1149   CALCIUM  9.7 05/21/2024 1149   PROT 6.9 05/21/2024 1149   PROT 7.5 10/12/2017 0950   ALBUMIN 4.1 05/21/2024 1149   ALBUMIN 4.3 10/12/2017 0950   AST 20 05/21/2024 1149   ALT 21 05/21/2024 1149   ALKPHOS 126 05/21/2024 1149   BILITOT 0.2 05/21/2024 1149   GFRNONAA >60 05/21/2024 1149   GFRAA >60 12/06/2019 1441   GFRAA >60 09/07/2018 1455     ASSESSMENT and THERAPY PLAN:     Assessment and Plan Assessment & Plan Malignant neoplasm of upper-outer quadrant of left breast Currently on Herceptin  and Perjeta . Echocardiogram showed LVEF 60-65% with normal strain. Awaiting scan results for treatment response. - Continue Herceptin  and Perjeta . - Await scan results next week. - Follow up with Dr. Gudina on December 8th, 2025.  Insomnia Reported not sleeping for four days. Xanax  prescribed but not taken recently. Refill requested. - Refilled Xanax  prescription.  RTC in 3 weeks for labs, f/u, and next treatment.    All questions were answered. The patient knows to call the clinic with any problems, questions or concerns. We can certainly see the patient much sooner if necessary.  Total encounter time:20 minutes*in face-to-face visit time, chart review, lab review, care coordination, order entry, and documentation of the encounter time.    Morna Kendall, NP 06/11/24 10:44 AM Medical  Oncology and Hematology [facility] [address] [place], [place] [address] Tel. [phone]    Fax. [phone]  *Total Encounter Time as defined by the Centers for Medicare and Medicaid Services includes, in addition to the face-to-face time of a patient visit (documented in the note above) non-face-to-face time: obtaining and reviewing outside history, ordering and reviewing medications, tests or procedures, care coordination (communications with other health care professionals or caregivers) and documentation in the medical record.

## 2024-06-11 NOTE — Progress Notes (Signed)
Patient declined post pertuzumab observation.  Tolerated treatment well without incident.  VSS at discharge.  Ambulated to lobby.

## 2024-06-11 NOTE — Patient Instructions (Signed)
 [facility] - A DEPT OF [facility]  Discharge Instructions: Thank you for choosing [facility] to provide your oncology and hematology care.   If you have a lab appointment with the Cancer Center, please go directly to the Cancer Center and check in at the registration area.   Wear comfortable clothing and clothing appropriate for easy access to any Portacath or PICC line.   We strive to give you quality time with your provider. You may need to reschedule your appointment if you arrive late (15 or more minutes).  Arriving late affects you and other patients whose appointments are after yours.  Also, if you miss three or more appointments without notifying the office, you may be dismissed from the clinic at the provider's discretion.      For prescription refill requests, have your pharmacy contact our office and allow 72 hours for refills to be completed.    Today you received the following chemotherapy and/or immunotherapy agents: Zometa , Trastuzumab , Pertuzumab       To help prevent nausea and vomiting after your treatment, we encourage you to take your nausea medication as directed.  BELOW ARE SYMPTOMS THAT SHOULD BE REPORTED IMMEDIATELY: *FEVER GREATER THAN 100.4 F (38 C) OR HIGHER *CHILLS OR SWEATING *NAUSEA AND VOMITING THAT IS NOT CONTROLLED WITH YOUR NAUSEA MEDICATION *UNUSUAL SHORTNESS OF BREATH *UNUSUAL BRUISING OR BLEEDING *URINARY PROBLEMS (pain or burning when urinating, or frequent urination) *BOWEL PROBLEMS (unusual diarrhea, constipation, pain near the anus) TENDERNESS IN MOUTH AND THROAT WITH OR WITHOUT PRESENCE OF ULCERS (sore throat, sores in mouth, or a toothache) UNUSUAL RASH, SWELLING OR PAIN  UNUSUAL VAGINAL DISCHARGE OR ITCHING   Items with * indicate a potential emergency and should be followed up as soon as possible or go to the Emergency Department if any problems should occur.  Please show the CHEMOTHERAPY ALERT  CARD or IMMUNOTHERAPY ALERT CARD at check-in to the Emergency Department and triage nurse.  Should you have questions after your visit or need to cancel or reschedule your appointment, please contact [facility] - A DEPT OF [facility]  Dept: [phone]  and follow the prompts.  Office hours are 8:00 a.m. to 4:30 p.m. Monday - Friday. Please note that voicemails left after 4:00 p.m. may not be returned until the following business day.  We are closed weekends and major holidays. You have access to a nurse at all times for urgent questions. Please call the main number to the clinic Dept: [phone] and follow the prompts.   For any non-urgent questions, you may also contact your provider using MyChart. We now offer e-Visits for anyone 8 and older to request care online for non-urgent symptoms. For details visit [email or web address].   Also download the MyChart app! Go to the app store, search MyChart, open the app, select [facility], and log in with your MyChart username and password.

## 2024-06-14 ENCOUNTER — Ambulatory Visit
Admission: RE | Admit: 2024-06-14 | Discharge: 2024-06-14 | Disposition: A | Discharge status: Home / Self Care | Source: Ambulatory Visit | Attending: Radiation Oncology | Admitting: Radiation Oncology

## 2024-06-14 ENCOUNTER — Encounter: Payer: Self-pay | Admitting: Hematology and Oncology

## 2024-06-14 DIAGNOSIS — C50412 Malignant neoplasm of upper-outer quadrant of left female breast: Secondary | ICD-10-CM

## 2024-06-14 DIAGNOSIS — C7951 Secondary malignant neoplasm of bone: Secondary | ICD-10-CM

## 2024-06-14 NOTE — Progress Notes (Signed)
 Patient identity verified x2. [name] is here for a one month telephone follow up. 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 Does the patient complain of any of the following: Post radiation skin issues: None Joint Pain/ Swelling: None Range of Motion limitations: None Fatigue post radiation: None Appetite good/fair/poor: Okay  Additional comments if applicable:  None

## 2024-06-18 ENCOUNTER — Ambulatory Visit (HOSPITAL_COMMUNITY)
Admission: RE | Admit: 2024-06-18 | Discharge: 2024-06-18 | Disposition: A | Discharge status: Home / Self Care | Source: Ambulatory Visit | Attending: Hematology and Oncology | Admitting: Hematology and Oncology

## 2024-06-18 ENCOUNTER — Encounter (HOSPITAL_COMMUNITY): Payer: Self-pay

## 2024-06-18 DIAGNOSIS — C50412 Malignant neoplasm of upper-outer quadrant of left female breast: Secondary | ICD-10-CM | POA: Insufficient documentation

## 2024-06-18 DIAGNOSIS — C787 Secondary malignant neoplasm of liver and intrahepatic bile duct: Secondary | ICD-10-CM | POA: Diagnosis not present

## 2024-06-18 DIAGNOSIS — C50912 Malignant neoplasm of unspecified site of left female breast: Secondary | ICD-10-CM | POA: Diagnosis not present

## 2024-06-18 DIAGNOSIS — Z170 Estrogen receptor positive status [ER+]: Secondary | ICD-10-CM | POA: Diagnosis not present

## 2024-06-18 DIAGNOSIS — I700 Atherosclerosis of aorta: Secondary | ICD-10-CM | POA: Diagnosis not present

## 2024-06-18 DIAGNOSIS — J90 Pleural effusion, not elsewhere classified: Secondary | ICD-10-CM | POA: Diagnosis not present

## 2024-06-18 MED ORDER — IOHEXOL 300 MG/ML  SOLN
100.0000 mL | Freq: Once | INTRAMUSCULAR | Status: AC | PRN
  Administered 2024-06-18: 100 mL via INTRAVENOUS

## 2024-06-18 MED ORDER — SODIUM CHLORIDE (PF) 0.9 % IJ SOLN
INTRAMUSCULAR | Status: AC
Start: 2024-06-18 — End: 2024-06-18
  Filled 2024-06-18: qty 50

## 2024-06-18 MED ORDER — HEPARIN SOD (PORK) LOCK FLUSH 100 UNIT/ML IV SOLN
500.0000 [IU] | Freq: Once | INTRAVENOUS | Status: AC
  Administered 2024-06-18: 500 [IU] via INTRAVENOUS

## 2024-06-18 MED ORDER — HEPARIN SOD (PORK) LOCK FLUSH 100 UNIT/ML IV SOLN
INTRAVENOUS | Status: AC
  Filled 2024-06-18: qty 5

## 2024-06-27 ENCOUNTER — Other Ambulatory Visit: Payer: Self-pay | Admitting: *Deleted

## 2024-06-27 DIAGNOSIS — C50412 Malignant neoplasm of upper-outer quadrant of left female breast: Secondary | ICD-10-CM

## 2024-06-29 ENCOUNTER — Inpatient Hospital Stay: Attending: Hematology and Oncology

## 2024-06-29 ENCOUNTER — Inpatient Hospital Stay: Admitting: Hematology and Oncology

## 2024-06-29 ENCOUNTER — Inpatient Hospital Stay

## 2024-06-29 VITALS — BP 111/57 | HR 68 | Temp 98.1°F | Resp 18 | Ht 64.0 in | Wt 121.3 lb

## 2024-06-29 DIAGNOSIS — C7951 Secondary malignant neoplasm of bone: Secondary | ICD-10-CM | POA: Insufficient documentation

## 2024-06-29 DIAGNOSIS — Z170 Estrogen receptor positive status [ER+]: Secondary | ICD-10-CM | POA: Diagnosis not present

## 2024-06-29 DIAGNOSIS — C50412 Malignant neoplasm of upper-outer quadrant of left female breast: Secondary | ICD-10-CM

## 2024-06-29 DIAGNOSIS — Z79811 Long term (current) use of aromatase inhibitors: Secondary | ICD-10-CM | POA: Diagnosis not present

## 2024-06-29 DIAGNOSIS — Z9013 Acquired absence of bilateral breasts and nipples: Secondary | ICD-10-CM | POA: Diagnosis not present

## 2024-06-29 DIAGNOSIS — Z5111 Encounter for antineoplastic chemotherapy: Secondary | ICD-10-CM | POA: Insufficient documentation

## 2024-06-29 DIAGNOSIS — C787 Secondary malignant neoplasm of liver and intrahepatic bile duct: Secondary | ICD-10-CM | POA: Diagnosis not present

## 2024-06-29 DIAGNOSIS — Z79899 Other long term (current) drug therapy: Secondary | ICD-10-CM | POA: Insufficient documentation

## 2024-06-29 LAB — CMP (CANCER CENTER ONLY)
ALT: 24 U/L (ref 0–44)
AST: 28 U/L (ref 15–41)
Albumin: 4 g/dL (ref 3.5–5.0)
Alkaline Phosphatase: 117 U/L (ref 38–126)
Anion gap: 8 (ref 5–15)
BUN: 14 mg/dL (ref 6–20)
CO2: 24 mmol/L (ref 22–32)
Calcium: 9.1 mg/dL (ref 8.9–10.3)
Chloride: 106 mmol/L (ref 98–111)
Creatinine: 0.57 mg/dL (ref 0.44–1.00)
GFR, Estimated: >60 mL/min (ref >60)
Glucose, Bld: 104 mg/dL — ABNORMAL HIGH (ref 70–99)
Potassium: 3.8 mmol/L (ref 3.5–5.1)
Sodium: 138 mmol/L (ref 135–145)
Total Bilirubin: <0.2 mg/dL (ref 0.0–1.2)
Total Protein: 6.6 g/dL (ref 6.5–8.1)

## 2024-06-29 LAB — CBC WITH DIFFERENTIAL (CANCER CENTER ONLY)
Abs Immature Granulocytes: 0.01 K/uL (ref 0.00–0.07)
Basophils Absolute: 0 K/uL (ref 0.0–0.1)
Basophils Relative: 1 %
Eosinophils Absolute: 0 K/uL (ref 0.0–0.5)
Eosinophils Relative: 1 %
HCT: 31.5 % — ABNORMAL LOW (ref 36.0–46.0)
Hemoglobin: 10.7 g/dL — ABNORMAL LOW (ref 12.0–15.0)
Immature Granulocytes: 0 %
Lymphocytes Relative: 15 %
Lymphs Abs: 0.6 K/uL — ABNORMAL LOW (ref 0.7–4.0)
MCH: 32.2 pg (ref 26.0–34.0)
MCHC: 34 g/dL (ref 30.0–36.0)
MCV: 94.9 fL (ref 80.0–100.0)
Monocytes Absolute: 0.4 K/uL (ref 0.1–1.0)
Monocytes Relative: 12 %
Neutro Abs: 2.7 K/uL (ref 1.7–7.7)
Neutrophils Relative %: 71 %
Platelet Count: 248 K/uL (ref 150–400)
RBC: 3.32 MIL/uL — ABNORMAL LOW (ref 3.87–5.11)
RDW: 15.5 % (ref 11.5–15.5)
WBC Count: 3.8 K/uL — ABNORMAL LOW (ref 4.0–10.5)
nRBC: 0 % (ref 0.0–0.2)

## 2024-06-29 MED ORDER — PALBOCICLIB 125 MG PO CAPS: 125.0000 mg | ORAL_CAPSULE | Freq: Every day | ORAL | 3 refills | Status: DC

## 2024-06-29 NOTE — Progress Notes (Signed)
 [facility]  [phone] [phone]    ID: Paige Foster DOB: 06-Dec-1965  MR#: [id number]  RDW#:[id number]  Patient Care Team: Katheen Roselie Rockford, NP as PCP - General (Internal Medicine) Kate Lonni CROME, MD as PCP - Cardiology (Cardiology) Ebbie Cough, MD as Consulting Physician (General Surgery) Tobie Baptist, MD as Consulting Physician (Ophthalmology) Legrand Victory CROME MOULD, MD as Consulting Physician (Gastroenterology) Abigail Maude POUR (Optometry) Loretha Ash, MD as Consulting Physician (Hematology and Oncology) Izell Domino, MD as Attending Physician (Radiation Oncology)  CHIEF COMPLAINT: 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  She experiences significant tearing from her eyes and a runny nose. She is unsure how to manage these symptoms. The tearing consists of clear tears, and she is unsure how to manage these symptoms.  She recently underwent a scan. She recalls the doctor reviewing the scan with her. She was told there is inflammation in her large intestine and reports experiencing diarrhea as a side effect of her treatment. She confirms experiencing diarrhea as a side effect of her treatment.  No pain or swelling is reported, and she continues to work. She mentions a slight weight gain but states her weight is mostly steady.  Rest of the pertinent 10 point ROS reviewed and neg.  HISTORY OF CURRENT ILLNESS: From the original intake note:  Paige Foster has a prior history of left breast cancer, dating back to 2004. At that time she underwent a left mastectomy for stage 0 (noninvasive) breast cancer, with transverse rectus abdominis (TRAM) flap construction under Dr. Marcus.  She also underwent a right breast reduction. She took tamoxifen for three years.  More recently she underwent bilateral diagnostic mammography with tomography and left breast ultrasonography at [facility] on 01/03/2018 showing: Breast Density Category B. There is an oval fat containing lesion in the left breast upper outer quadrant posterior depth. No other significant masses, calcifications, or other findings are seen in either breast. Sonographically, there is a 1.5 cm lesion in the left breast upper outer quadrant posterior depth. This lesion is of mixed echogenicity. This correlates as palpated and with mammography findings. Follow up was recommended.  Close follow-up was suggested.  She then presented with a non-tender mass in the left reconstructed breast on 08/30/2018. On physical exam, there is a hard palpable lump measuring 2.0 cm in the upper outer left reconstructed breast 10 cm from the expected location of a nipple. Sonography over this area demonstrates a 1.8 cm x 1.7 cm x 1.4 cm mass in the left breast at 2 o'clock posterior depth 10 cm from the nipple. This mass is of mixed echogenicity. This abnormality is increased in size and correlates as palpated and with prior mammography findings. Color flow imaging demonstrates that there is vascularity present. Elastography imaging assessment is intermediate. No significant abnormalities were seen sonographically in the left axilla.    Accordingly on 08/30/2018 she proceeded to biopsy of the left breast mass in question. The pathology from this procedure showed ([id number]): invasive ductal carcinoma, grade III. Prognostic indicators significant for: estrogen receptor, 100% positive with strong staining intensity and progesterone receptor, 0% negative. Proliferation marker Ki67 at 15%. HER2 positive (3+) by immunohistochemistry.  The patient's subsequent history is as detailed below.   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    Port-A-Cath in place 09/28/2018   Recurrent breast cancer, left [facility]) oncologist-- dr layla    dx 2004, noninvasive Stage 0 ----s/p left mastectomy w/ tram flap construction (and right breast reduction), taken Tamoxifen for 3 yrs;   08-30-2018 recurrent left cancer , Grade III,  cT1c,  ER positive, PR negative, HER-2 positive, invasive ductal carcinoma-- neoadjuvant chemo to start 09-26-2018   Renal artery stenosis    mild right external renal artery stenosis per duplex in epic 08-09-2013   Wears glasses     PAST SURGICAL HISTORY: Past Surgical History:  Procedure Laterality Date   BREAST LUMPECTOMY WITH RADIOACTIVE SEED LOCALIZATION Left 02/20/2019   Procedure: LEFT BREAST LUMPECTOMY WITH RADIOACTIVE SEED LOCALIZATION;  Surgeon: Ebbie Cough, MD;  Location: [facility] OR;  Service: General;  Laterality: Left;   BREAST SURGERY Left    Transflap   COLONOSCOPY     COLONOSCOPY     IR IMAGING GUIDED PORT INSERTION  11/25/2023   LEFT HEART CATH AND CORONARY ANGIOGRAPHY N/A 12/16/2023   Procedure: LEFT HEART CATH AND CORONARY ANGIOGRAPHY;  Surgeon: Jordan, Peter M, MD;  Location: [facility] INVASIVE CV LAB;  Service: Cardiovascular;  Laterality: N/A;   MASTECTOMY Left 2004   w/  TRAM flap construction and right breast augmentation with abdominoplasy   PARS PLANA VITRECTOMY Left 12/18/2018   Procedure: PARS PLANA VITRECTOMY WITH 25 GAUGE, ENDOLASER;  Surgeon: Tobie Baptist, MD;  Location: [facility] OR;  Service: Ophthalmology;  Laterality: Left;   PORTACATH PLACEMENT N/A 09/25/2018   Procedure: INSERTION PORT-A-CATH WITH ULTRASOUND;  Surgeon: Ebbie Cough, MD;  Location: [facility] ORS;  Service: General;  Laterality: N/A;   TUBAL LIGATION Bilateral yrs ago    FAMILY HISTORY: Family History  Problem Relation Age of Onset   Diabetes Mother     Hypertension Mother    Kidney disease Father    Colon cancer Neg Hx    Colon polyps Neg Hx    Gallbladder disease Neg Hx    Heart disease Neg Hx    Esophageal cancer Neg Hx    Stomach cancer Neg Hx    Rectal cancer Neg Hx   Adonna's father died from unknown causes in his early 53's. Patients' mother died from diabetes complications at age 56. The patient has 1 sister. Patient denies anyone in her family having breast, ovarian, prostate, or pancreatic cancer.    GYNECOLOGIC HISTORY:  Patient's last menstrual period was 06/08/2007. 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 Her husband, Zachary, works at [employer]. Maysel has two children, Fonda and Alm. Fonda lives with her, is 10, and it attending [employer] for a computer based degree. Alm lives with her, is 27, and recently graduated from [employer] with a degree in Pension Scheme Manager.  He works for [employer]. Shavonte has no grandchildren. She attends the [employer].   ADVANCED DIRECTIVES: In the absence of any documents to the contrary her husband, Zachary, is automatically her healthcare power of attorney     HEALTH MAINTENANCE: Social History   Tobacco  Use   Smoking status: Never    Passive exposure: Never   Smokeless tobacco: Never  Vaping Use   Vaping status: Never Used  Substance Use Topics   Alcohol use: Not Currently    Alcohol/week: 0.0 standard drinks of alcohol    Comment: Occassionally   Drug use: No    Colonoscopy: April 2022, danis  PAP: January 2022, [facility]  Bone density:  2017; -1.6, osteopenic   Allergies  Allergen Reactions   Bee Pollen Itching    Watery eyes and nose running    Current Outpatient Medications  Medication Sig Dispense Refill   ALPRAZolam  (XANAX ) 0.5 MG tablet Take 1 tablet (0.5 mg total) by mouth at  bedtime as needed for anxiety. 30 tablet 0   amLODipine  (NORVASC ) 10 MG tablet Take 1 tablet by mouth daily. 90 tablet 2   atorvastatin  (LIPITOR) 20 MG tablet Take 1 tablet (20 mg total) by mouth daily. 90 tablet 3   brimonidine  (ALPHAGAN ) 0.2 % ophthalmic solution Place 1 drop into both eyes 3 (three) times daily.     dexamethasone  (DECADRON ) 4 MG tablet Take 2 tabs by mouth 2 times daily starting day before chemo. Then take 2 tabs daily for 2 days starting day after chemo. Take with food. 30 tablet 1   DOCEtaxel  (TAXOTERE ) 20 MG/ML CONC Inject 121 mg into the vein once.     dorzolamide -timolol  (COSOPT ) 2-0.5 % ophthalmic solution 1 drop 2 (two) times daily.     lidocaine -prilocaine  (EMLA ) cream Apply to affected area 1 hour before access prn 30 g 3   losartan  (COZAAR ) 50 MG tablet TAKE 1 TABLET(50 MG) BY MOUTH DAILY 90 tablet 3   meloxicam  (MOBIC ) 7.5 MG tablet Take 1 tablet (7.5 mg total) by mouth daily. 30 tablet 0   omeprazole  (PRILOSEC) 20 MG capsule Take 1 capsule (20 mg total) by mouth daily. 90 capsule 1   ondansetron  (ZOFRAN ) 8 MG tablet Take 1 tablet (8 mg total) by mouth every 8 (eight) hours as needed for nausea or vomiting. 20 tablet 3   pegfilgrastim  (NEULASTA  ONPRO KIT) 6 MG/0.6ML injection Inject 6 mg into the skin once. Inject via provided programmed delivery device.     pertuzumab  (PERJETA ) 420 MG/14ML SOLN Inject 420 mg into the vein once.     prochlorperazine  (COMPAZINE ) 10 MG tablet Take 1 tablet (10 mg total) by mouth every 6 (six) hours as needed for nausea or vomiting. 30 tablet 1   trastuzumab -anns 336 mg in sodium chloride  0.9 % 250 mL Inject 336 mg into the vein once.     zoledronic  acid (ZOMETA ) 4 MG/5ML injection Inject 4 mg into the vein once.     No current facility-administered medications for this visit.     OBJECTIVE: African-American woman who appears stated age  Vitals:   06/29/24 1435  BP: (!) 111/57  Pulse: 68  Resp: 18  Temp: 98.1 F (36.7 C)   SpO2: 100%        Body mass index is 20.82 kg/m.   Wt Readings from Last 3 Encounters:  06/29/24 121 lb 4.8 oz (55 kg)  06/11/24 123 lb 11.2 oz (56.1 kg)  05/21/24 122 lb 4.8 oz (55.5 kg)   Physical Exam Constitutional:      Appearance: Normal appearance.  Cardiovascular:     Rate and Rhythm: Normal rate and regular rhythm.     Pulses: Normal pulses.     Heart sounds: Normal heart sounds.  Pulmonary:     Effort:  Pulmonary effort is normal.     Breath sounds: Normal breath sounds.  Musculoskeletal:        General: No swelling.     Cervical back: Normal range of motion and neck supple. No rigidity.  Lymphadenopathy:     Cervical: No cervical adenopathy.  Skin:    General: Skin is warm and dry.  Neurological:     General: No focal deficit present.     Mental Status: She is alert.       LAB RESULTS:  CMP     Component Value Date/Time   NA 137 06/11/2024 1018   NA 140 10/12/2017 0950   K 3.9 06/11/2024 1018   CL 105 06/11/2024 1018   CO2 26 06/11/2024 1018   GLUCOSE 83 06/11/2024 1018   BUN 12 06/11/2024 1018   BUN 10 10/12/2017 0950   CREATININE 0.56 06/11/2024 1018   CALCIUM  9.3 06/11/2024 1018   PROT 6.4 (L) 06/11/2024 1018   PROT 7.5 10/12/2017 0950   ALBUMIN 4.0 06/11/2024 1018   ALBUMIN 4.3 10/12/2017 0950   AST 24 06/11/2024 1018   ALT 22 06/11/2024 1018   ALKPHOS 113 06/11/2024 1018   BILITOT 0.3 06/11/2024 1018   GFRNONAA >60 06/11/2024 1018   GFRAA >60 12/06/2019 1441   GFRAA >60 09/07/2018 1455   Lab Results  Component Value Date   WBC 3.8 (L) 06/29/2024   NEUTROABS 2.7 06/29/2024   HGB 10.7 (L) 06/29/2024   HCT 31.5 (L) 06/29/2024   MCV 94.9 06/29/2024   PLT 248 06/29/2024    Lab Results  Component Value Date   LABCA2 <4 08/30/2007    No components found for: [id number]  No results for input(s): INR in the last 168 hours.   Lab Results  Component Value Date   LABCA2 <4 08/30/2007    No results found for:  CAN199  No results found for: CAN125  Lab Results  Component Value Date   CAN153 23.9 10/27/2023    Lab Results  Component Value Date   CA2729 27.9 10/27/2023    No components found for: HGQUANT  No results found for: CEA1, CEA / No results found for: CEA1, CEA  No results found for: AFPTUMOR  No results found for: CHROMOGRNA  No results found for: TOTALPROTELP, ALBUMINELP, A1GS, A2GS, BETS, BETA2SER, GAMS, MSPIKE, SPEI (this displays SPEP labs)  No results found for: KPAFRELGTCHN, LAMBDASER, KAPLAMBRATIO (kappa/lambda light chains)  No results found for: HGBA, HGBA2QUANT, HGBFQUANT, HGBSQUAN (Hemoglobinopathy evaluation)   Lab Results  Component Value Date   LDH 169 08/30/2007    Lab Results  Component Value Date   IRON 96 07/11/2023   TIBC 382 07/11/2023   IRONPCTSAT 25 07/11/2023   (Iron and TIBC)  Lab Results  Component Value Date   FERRITIN 261 07/11/2023    Urinalysis    Component Value Date/Time   COLORURINE STRAW (A) 12/15/2023 0809   APPEARANCEUR CLEAR 12/15/2023 0809   LABSPEC 1.010 12/15/2023 0809   PHURINE 5.0 12/15/2023 0809   GLUCOSEU NEGATIVE 12/15/2023 0809   HGBUR NEGATIVE 12/15/2023 0809   HGBUR large 04/09/2008 1548   BILIRUBINUR NEGATIVE 12/15/2023 0809   KETONESUR NEGATIVE 12/15/2023 0809   PROTEINUR NEGATIVE 12/15/2023 0809   UROBILINOGEN 0.2 04/09/2008 1548   NITRITE NEGATIVE 12/15/2023 0809   LEUKOCYTESUR NEGATIVE 12/15/2023 0809    STUDIES:  CT CHEST ABDOMEN PELVIS W CONTRAST Result Date: 06/23/2024 CLINICAL DATA:  Metastatic breast cancer to bone and liver status post chemoradiation with loose stools. *  Tracking Code: BO * EXAM: CT CHEST, ABDOMEN, AND PELVIS WITH CONTRAST TECHNIQUE: Multidetector CT imaging of the chest, abdomen and pelvis was performed following the standard protocol during bolus administration of intravenous contrast. RADIATION DOSE REDUCTION: This exam  was performed according to the departmental dose-optimization program which includes automated exposure control, adjustment of the mA and/or kV according to patient size and/or use of iterative reconstruction technique. CONTRAST:  OMNIPAQUE  IOHEXOL  300 MG/ML  SOLN COMPARISON:  CT chest, abdomen, and pelvis dated 04/18/2024 FINDINGS: CT CHEST FINDINGS Cardiovascular: Right chest wall port terminates in the superior cavoatrial junction. Normal heart size. No significant pericardial fluid/thickening. Great vessels are normal in course and caliber. No central pulmonary emboli. Mediastinum/Nodes: Imaged thyroid  gland without nodules meeting criteria for imaging follow-up by size. Normal esophagus. No pathologically enlarged axillary, supraclavicular, mediastinal, or hilar lymph nodes. Lungs/Pleura: The central airways are patent. New round atelectasis/consolidation in the lateral right lower lobe (6:77). Calcified granuloma in the medial right lower lobe (6:105). Previously noted 2 mm nodule is not seen. No new or enlarging pulmonary nodules. No pneumothorax. New trace left pleural effusion. Musculoskeletal: Unchanged size of centrally necrotic soft tissue mass in the left lateral chest wall associated with the seventh rib measuring 5.9 x 3.9 cm (2:43), remeasured. Widespread lytic osseous metastases are also unchanged, for example displaced pathologic fracture involving the inferior right scapula and additional lucent lesions in the right scapula (2: 9, 11). Healing right lateral seventh rib fracture, as before. Similar sclerosis involving the medial right clavicle. Surgical changes of the left breast. CT ABDOMEN PELVIS FINDINGS Hepatobiliary: Again seen are innumerable hypoattenuating lesions throughout the liver. The mass majority appear grossly unchanged. A few are slightly increased in size, for example 3.3 x 2.8 cm anterior segment 5/8 (2:55), previously 2.5 x 2.1 cm. No intra or extrahepatic biliary ductal  dilation. Gallbladder is contracted. Pancreas: No focal lesions or main ductal dilation. Spleen: Normal in size without focal abnormality. Adrenals/Urinary Tract: No adrenal nodules. No suspicious renal mass, calculi, or hydronephrosis. Interval increase in size of 2.3 cm right upper pole simple cyst (2:64), previously 1.8 cm. No specific follow-up imaging recommended. No focal bladder wall thickening. Stomach/Bowel: Normal appearance of the stomach. Circumferential mural thickening of the sigmoid colon. The ascending, transverse, and descending colon are diffusely underdistended but also appear to demonstrates circumferential mural thickening. Colonic diverticulosis without acute diverticulitis. Normal appendix. Vascular/Lymphatic: Aortic atherosclerosis. No enlarged abdominal or pelvic lymph nodes. Reproductive: No adnexal masses. Other: Trace pelvic free fluid.  No fluid collection or free air. Musculoskeletal: Unchanged widespread osseous lytic metastases. Dystrophic calcification versus postsurgical change of the right anterior abdominal wall. IMPRESSION: 1. Circumferential mural thickening of the sigmoid colon in keeping with colitis. Diffuse circumferential mural thickening of the ascending, transverse, and descending colon, which may be due to underdistention or colitis. 2. New round atelectasis/consolidation in the lateral right lower lobe and new trace left pleural effusion. 3. Slight interval increase in size of a few of the innumerable hepatic metastases. 4. Unchanged size of centrally necrotic soft tissue mass in the left lateral chest wall associated with the seventh rib and widespread osseous lytic metastases. 5.  Aortic Atherosclerosis (ICD10-I70.0). Electronically Signed   By: Limin  Xu M.D.   On: 06/23/2024 19:52        ELIGIBLE FOR AVAILABLE RESEARCH PROTOCOL: No  ASSESSMENT: 58 y.o. [place], [place] woman  (1) history of left-sided ductal carcinoma in situ 2004  (a) s/p left mastectomy  with TRAM reconstruction  (  b) status post tamoxifen x3 years  (2) left breast upper outer quadrant biopsy 08/30/2018 shows a clinical T1c N0 invasive ductal carcinoma, grade 3, estrogen receptor strongly positive, progesterone receptor negative, with HER-2 amplification, and and MIB-1 of 15%.   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    (b) chemotherapy discontinued after 4 cycles because of intercurrent eye surgery  (c) continued trastuzumab  and pertuzumab  to complete a year (last dose 09/06/2019)  (d) echocardiogram on 01/23/2019 that shows well preserved EF of 60-65%  (e) echocardiogram on 04/23/2019 shows EF of 60-65%  (f) echocardiogram 08/02/2019 shows an ejection fraction in the 60-65% range  (4) left lumpectomy 02/20/2019 showed a residual  ypT1c NX invasive ductal carcinoma, grade 2 with negative margins.    (5) adjuvant radiation: Radiation Treatment Dates: 04/12/2019 through 05/28/2019 Site Technique Total Dose (Gy) Dose per Fx (Gy) Completed Fx Beam Energies  Breast: CW_Lt 3D 50.4/50.4 1.8 28/28 6X, 10X  Breast: CW_Lt_SCV_PAB 3D 50.4/50.4 1.8 28/28 6X, 10X  Breast: CW_Lt_Bst Electron 10/10 2 5/5 6X, 10X   (6) anastrozole  started 06/26/2019  (a) DEXA scan at [facility] 12/24/2015 found a T score of -1.6 DEXA scan from mass June 2023 showed T score of -1.4 at L1-L2  (7) genetics testing 02/03/2019 through the Common Hereditary Cancers Panel offered by [facility] found no deleterious mutations in APC, ATM, AXIN2, BARD1, BMPR1A, BRCA1, BRCA2, BRIP1, CDH1, CDKN2A (p14ARF), CDKN2A (p16INK4a), CKD4, CHEK2, CTNNA1, DICER1, EPCAM (Deletion/duplication testing only), GREM1  (promoter region deletion/duplication testing only), KIT, MEN1, MLH1, MSH2, MSH3, MSH6, MUTYH, NBN, NF1, NHTL1, PALB2, PDGFRA, PMS2, POLD1, POLE, PTEN, RAD50, RAD51C, RAD51D, RNF43, SDHB, SDHC, SDHD, SMAD4, SMARCA4. STK11, TP53, TSC1, TSC2, and VHL.  The following genes were evaluated for sequence changes only: SDHA and HOXB13 c.251G>A variant only.  (8) She had a CXR for unintentional weight loss,  this showed possible left lung pleural based mass and posterior right eighth rib met lesion. CT showed 3.8 x 5.5 x 5 cm left anterior chest wall mass along the mid axillary line with rib involvement and destruction and low-attenuation changes of the center indicating necrotic mass correlates with a neoplastic lesion likely metastatic disease. Liver is enlarged, inhomogeneous with multiple enhancing lesions throughout the right and left lobe of the liver consistent with extensive numerous metastatic liver lesions. Liver appears completely replaced by metastatic disease. Lytic metastatic bone lesions involving the right anterior superior iliac crest, left iliac bone, right inferior pubic ramus, left iliac bone, and L4 vertebral body. Suggestive of lytic bone changes involving the upper thoracic vertebral bodies T1-T2.  PLAN:  Assessment & Plan Metastatic HER2-positive breast cancer She is now on HP only, completed docetaxel  chemotherapy Will now add Ibrance  and anastrozole  based on PATINA trial.  Will arrange for oral chemo class to discuss more about Ibrance . Most recent imaging overall stable disease If progression noted, will try second line enhertu  Diarrhea and colitis due to cancer treatment Diarrhea and colitis secondary to treatment  Ok to use PRN imodium She has no signs of systemic infection  Epiphora due to cancer treatment Epiphora likely from chemotherapy, expected to improve post-chemotherapy with antibody treatment only. - Monitor for improvement over the next month. - Consider  ophthalmologist  referral for duct opening if symptoms persist.  Time spent: 40 min  Amber Stalls, MD  Medical Oncology and Hematology [facility] [address] [place], [place] [address] Tel. [phone]    Fax. [phone]  *Total Encounter Time as defined by the Centers for Medicare and Medicaid Services includes, in addition to the face-to-face time of a patient visit (documented in the note above) non-face-to-face time: obtaining and reviewing outside history, ordering and reviewing medications, tests or procedures, care coordination (communications with other health care professionals or caregivers) and documentation in the medical record.

## 2024-07-02 ENCOUNTER — Telehealth: Payer: Self-pay

## 2024-07-02 ENCOUNTER — Inpatient Hospital Stay

## 2024-07-02 ENCOUNTER — Encounter: Payer: Self-pay | Admitting: Hematology and Oncology

## 2024-07-02 ENCOUNTER — Telehealth: Payer: Self-pay | Admitting: Pharmacist

## 2024-07-02 ENCOUNTER — Other Ambulatory Visit (HOSPITAL_COMMUNITY): Payer: Self-pay

## 2024-07-02 ENCOUNTER — Other Ambulatory Visit: Payer: Self-pay | Admitting: Pharmacist

## 2024-07-02 ENCOUNTER — Inpatient Hospital Stay: Admitting: Hematology and Oncology

## 2024-07-02 ENCOUNTER — Ambulatory Visit (HOSPITAL_COMMUNITY): Admission: RE | Admit: 2024-07-02 | Discharge: 2024-07-02 | Attending: Adult Health | Admitting: Adult Health

## 2024-07-02 VITALS — BP 106/67 | HR 68 | Temp 98.1°F | Resp 14 | Wt 122.5 lb

## 2024-07-02 DIAGNOSIS — C7951 Secondary malignant neoplasm of bone: Secondary | ICD-10-CM | POA: Diagnosis not present

## 2024-07-02 DIAGNOSIS — Z0189 Encounter for other specified special examinations: Secondary | ICD-10-CM | POA: Diagnosis not present

## 2024-07-02 DIAGNOSIS — Z170 Estrogen receptor positive status [ER+]: Secondary | ICD-10-CM

## 2024-07-02 DIAGNOSIS — C787 Secondary malignant neoplasm of liver and intrahepatic bile duct: Secondary | ICD-10-CM | POA: Diagnosis not present

## 2024-07-02 DIAGNOSIS — Z09 Encounter for follow-up examination after completed treatment for conditions other than malignant neoplasm: Secondary | ICD-10-CM

## 2024-07-02 DIAGNOSIS — Z79899 Other long term (current) drug therapy: Secondary | ICD-10-CM | POA: Diagnosis not present

## 2024-07-02 DIAGNOSIS — Z5111 Encounter for antineoplastic chemotherapy: Secondary | ICD-10-CM | POA: Diagnosis not present

## 2024-07-02 DIAGNOSIS — C50912 Malignant neoplasm of unspecified site of left female breast: Secondary | ICD-10-CM

## 2024-07-02 DIAGNOSIS — C50412 Malignant neoplasm of upper-outer quadrant of left female breast: Secondary | ICD-10-CM | POA: Diagnosis not present

## 2024-07-02 DIAGNOSIS — Z79811 Long term (current) use of aromatase inhibitors: Secondary | ICD-10-CM | POA: Diagnosis not present

## 2024-07-02 DIAGNOSIS — Z9013 Acquired absence of bilateral breasts and nipples: Secondary | ICD-10-CM | POA: Diagnosis not present

## 2024-07-02 DIAGNOSIS — Z7969 Long term (current) use of other immunomodulators and immunosuppressants: Secondary | ICD-10-CM | POA: Diagnosis not present

## 2024-07-02 LAB — ECHOCARDIOGRAM COMPLETE
AR max vel: 1.67 cm2
AV Area VTI: 1.75 cm2
AV Area mean vel: 1.68 cm2
AV Mean grad: 7 mmHg
AV Peak grad: 13 mmHg
Ao pk vel: 1.8 m/s
Area-P 1/2: 4.36 cm2
Calc EF: 61.5 %
MV VTI: 2.32 cm2
S' Lateral: 2.9 cm
Single Plane A2C EF: 64.2 %
Single Plane A4C EF: 61.6 %

## 2024-07-02 MED ORDER — SODIUM CHLORIDE 0.9 % IV SOLN
420.0000 mg | Freq: Once | INTRAVENOUS | Status: AC
  Administered 2024-07-02: 420 mg via INTRAVENOUS
  Filled 2024-07-02: qty 14

## 2024-07-02 MED ORDER — ACETAMINOPHEN 325 MG PO TABS
650.0000 mg | ORAL_TABLET | Freq: Once | ORAL | Status: AC
  Administered 2024-07-02: 650 mg via ORAL
  Filled 2024-07-02: qty 2

## 2024-07-02 MED ORDER — SODIUM CHLORIDE 0.9 % IV SOLN: INTRAVENOUS | Status: DC

## 2024-07-02 MED ORDER — DIPHENHYDRAMINE HCL 25 MG PO CAPS
50.0000 mg | ORAL_CAPSULE | Freq: Once | ORAL | Status: AC
  Administered 2024-07-02: 50 mg via ORAL
  Filled 2024-07-02: qty 2

## 2024-07-02 MED ORDER — ROSUVASTATIN CALCIUM 5 MG PO TABS: 5.0000 mg | ORAL_TABLET | Freq: Every day | ORAL | 3 refills | Status: AC

## 2024-07-02 MED ORDER — TRASTUZUMAB-ANNS CHEMO 150 MG IV SOLR
6.0000 mg/kg | Freq: Once | INTRAVENOUS | Status: AC
  Administered 2024-07-02: 336 mg via INTRAVENOUS
  Filled 2024-07-02: qty 16

## 2024-07-02 MED ORDER — PALBOCICLIB 125 MG PO TABS: 125.0000 mg | ORAL_TABLET | Freq: Every day | ORAL | 3 refills | Status: DC

## 2024-07-02 NOTE — Progress Notes (Signed)
  Echocardiogram 2D Echocardiogram has been performed.  Paige Foster 07/02/2024, 10:30 AM

## 2024-07-02 NOTE — Telephone Encounter (Signed)
 Oral Oncology Patient Advocate Encounter  Prior Authorization for IBRANCE   has been approved.    PA# [id number] Effective dates: 07/02/2024 through 07/02/2025  Patient must fill prescription through [facility] per insurance    Charlott Hamilton,  CPhT-Adv  she/her/hers [place]  [facility] Pharmacy Technician Patient Advocate Specialist III [facility] Phone: [phone]  Fax: [phone] [email or web address]

## 2024-07-02 NOTE — Patient Instructions (Signed)
 [facility] - A DEPT OF [place]  Discharge Instructions: Thank you for choosing [facility] to provide your oncology and hematology care.   If you have a lab appointment with the Cancer Center, please go directly to the Cancer Center and check in at the registration area.   Wear comfortable clothing and clothing appropriate for easy access to any Portacath or PICC line.   We strive to give you quality time with your provider. You may need to reschedule your appointment if you arrive late (15 or more minutes).  Arriving late affects you and other patients whose appointments are after yours.  Also, if you miss three or more appointments without notifying the office, you may be dismissed from the clinic at the provider's discretion.      For prescription refill requests, have your pharmacy contact our office and allow 72 hours for refills to be completed.    Today you received the following chemotherapy and/or immunotherapy agents: trastuzumab  and pertuzumab       To help prevent nausea and vomiting after your treatment, we encourage you to take your nausea medication as directed.  BELOW ARE SYMPTOMS THAT SHOULD BE REPORTED IMMEDIATELY: *FEVER GREATER THAN 100.4 F (38 C) OR HIGHER *CHILLS OR SWEATING *NAUSEA AND VOMITING THAT IS NOT CONTROLLED WITH YOUR NAUSEA MEDICATION *UNUSUAL SHORTNESS OF BREATH *UNUSUAL BRUISING OR BLEEDING *URINARY PROBLEMS (pain or burning when urinating, or frequent urination) *BOWEL PROBLEMS (unusual diarrhea, constipation, pain near the anus) TENDERNESS IN MOUTH AND THROAT WITH OR WITHOUT PRESENCE OF ULCERS (sore throat, sores in mouth, or a toothache) UNUSUAL RASH, SWELLING OR PAIN  UNUSUAL VAGINAL DISCHARGE OR ITCHING   Items with * indicate a potential emergency and should be followed up as soon as possible or go to the Emergency Department if any problems should occur.  Please show the CHEMOTHERAPY ALERT CARD  or IMMUNOTHERAPY ALERT CARD at check-in to the Emergency Department and triage nurse.  Should you have questions after your visit or need to cancel or reschedule your appointment, please contact [facility] - A DEPT OF [facility]  Dept: [phone]  and follow the prompts.  Office hours are 8:00 a.m. to 4:30 p.m. Monday - Friday. Please note that voicemails left after 4:00 p.m. may not be returned until the following business day.  We are closed weekends and major holidays. You have access to a nurse at all times for urgent questions. Please call the main number to the clinic Dept: [phone] and follow the prompts.   For any non-urgent questions, you may also contact your provider using MyChart. We now offer e-Visits for anyone 30 and older to request care online for non-urgent symptoms. For details visit [email or web address].   Also download the MyChart app! Go to the app store, search "MyChart", open the app, select [place], and log in with your MyChart username and password.

## 2024-07-02 NOTE — Telephone Encounter (Signed)
 Oral Oncology Patient Advocate Encounter   Received notification that prior authorization for Ibrance  is required.   PA submitted on 07/02/2024 Key [id number] Status is pending      Charlott Hamilton,  CPhT-Adv  she/her/hers [facility]  [facility] Specialty Pharmacy Services Pharmacy Technician Patient Advocate Specialist III [facility] Phone: [phone]  Fax: [phone] [email or web address]

## 2024-07-02 NOTE — Telephone Encounter (Signed)
 [facility]        Telephone: [phone]?Fax: [phone]   Oncology Clinical Pharmacist Practitioner Encounter   Received new prescription for palbociclib  for the treatment of breast cancer. This is being given in combination with pertuzumab , trastuzumab , and anastrozole . It is planned to continue until disease progression or unacceptable toxicity.  Labs from 06/29/24 assessed. Prescription dose and frequency assessed.   Current medication list in Epic reviewed. Significant DDIs with palbociclib  identified:Yes. Notified Dr. Loretha to consider palbociclib  tablets and less chance of interaction with PPIs. There is also possible interaction with atorvastatin . Rosuvastatin  and pravastatin have less chance for interaction.   Evaluated chart. Patient barriers to medication adherence identified: No.  Patient agreement for treatment documented in physician note on 06/29/24.  Prescription has been e-scribed to the [facility]) for benefits analysis and approval.  Oral Oncology Clinic will continue to follow for insurance authorization, copayment issues, initial counseling and start date.  Hermie Reagor A. Lucila, PharmD, BCOP, CPP Hematology-Oncology Clinical Pharmacist Practitioner  07/02/2024 11:25 AM  **Disclaimer: This note was dictated with voice recognition software. Similar sounding words can inadvertently be transcribed and this note may contain transcription errors which may not have been corrected upon publication of note.**

## 2024-07-03 ENCOUNTER — Other Ambulatory Visit: Payer: Self-pay | Admitting: Pharmacist

## 2024-07-03 DIAGNOSIS — C50912 Malignant neoplasm of unspecified site of left female breast: Secondary | ICD-10-CM

## 2024-07-03 DIAGNOSIS — Z170 Estrogen receptor positive status [ER+]: Secondary | ICD-10-CM

## 2024-07-03 NOTE — Progress Notes (Signed)
 Per Dr. Loretha,   Patient will be adding palbociclib  (Ibrance ) to treatment regimen per the PATINA trial data. Will add lab appts to each cycle including CBC with diff and CMP.  Wilmetta Speiser A. Lucila, PharmD, BCOP, CPP 07/03/2024 9:39 AM

## 2024-07-04 ENCOUNTER — Other Ambulatory Visit: Payer: Self-pay

## 2024-07-04 ENCOUNTER — Telehealth: Payer: Self-pay

## 2024-07-04 ENCOUNTER — Other Ambulatory Visit (HOSPITAL_COMMUNITY): Payer: Self-pay

## 2024-07-04 NOTE — Telephone Encounter (Signed)
 Oral Oncology Patient Advocate Encounter  Received notification that the request for prior authorization for palbociclib  (IBRANCE ) 125 MG tablet  has been denied due to  .   Appeal has been started  by Pharmacist [place]     Pending Dr. Walter signature on appeal  (in mailbox on site)    Charlott Hamilton,  CPhT-Adv  she/her/hers [facility]  [facility] Specialty Pharmacy Services Pharmacy Technician Patient Advocate Specialist III [facility] Phone: [phone]  Fax: [phone] [email or web address]

## 2024-07-04 NOTE — Telephone Encounter (Signed)
 Oral Oncology Patient Advocate Encounter   Received notification that prior authorization for palbociclib  (IBRANCE ) 125 MG tablet  is required.   PA submitted on 07/04/24 Key [id number] Status is pending     Lucie Lamer, CPhT [place]  [facility] Specialty Pharmacy Services Oncology Pharmacy Patient Advocate Specialist II [facility]ER[facility] [place] Phone: [phone]  Fax: [phone] [email or web address]

## 2024-07-10 NOTE — Telephone Encounter (Signed)
 Oral Oncology Patient Advocate Encounter   An urgent appeal for the prior authorization denial of Ibrance  has been started by the pharmacist on 07/10/2024. I faxed in signed appeal letter, last OV, and [place]  to [facility] Urgent Appeal     Phone# [phone] Fax# [phone]     This encounter will continue to be updated until final appeal determination.       Charlott Hamilton,  CPhT-Adv  she/her/hers [facility]  [facility] Specialty Pharmacy Services Pharmacy Technician Patient Advocate Specialist III [facility] Phone: [phone]  Fax: [phone] [email or web address]

## 2024-07-11 ENCOUNTER — Other Ambulatory Visit (HOSPITAL_COMMUNITY)

## 2024-07-12 ENCOUNTER — Other Ambulatory Visit: Payer: BC Managed Care – PPO

## 2024-07-12 ENCOUNTER — Ambulatory Visit: Payer: BC Managed Care – PPO | Admitting: Hematology and Oncology

## 2024-07-12 ENCOUNTER — Other Ambulatory Visit (HOSPITAL_COMMUNITY)

## 2024-07-12 ENCOUNTER — Other Ambulatory Visit (HOSPITAL_COMMUNITY): Payer: Self-pay

## 2024-07-12 NOTE — Telephone Encounter (Signed)
 Oral Oncology Patient Advocate Encounter  Appeal for Ibrance  was approved for capsules even though appeal stated tablets. I done a test claim to confirm tablets are not covered under this appeal. I have  called BC/BS they require the documents  to be resent in  with case ID: [id number] for tablets to be reviewed.  I have resent this info in.  Status: Pending Appeal approval for tablets     Charlott Hamilton,  CPhT-Adv  she/her/hers [place]  [facility] Special[facility] Pharmacy Services Pharmacy Technician Patient Advocate Specialist III [facility] Phone: [phone]  Fax: [phone] [email or web address]

## 2024-07-13 ENCOUNTER — Other Ambulatory Visit: Payer: Self-pay | Admitting: Pharmacist

## 2024-07-13 ENCOUNTER — Other Ambulatory Visit (HOSPITAL_COMMUNITY): Payer: Self-pay

## 2024-07-13 ENCOUNTER — Encounter: Payer: Self-pay | Admitting: Pharmacist

## 2024-07-13 ENCOUNTER — Telehealth: Payer: Self-pay | Admitting: Pharmacist

## 2024-07-13 MED ORDER — PALBOCICLIB 125 MG PO TABS: 125.0000 mg | ORAL_TABLET | Freq: Every day | ORAL | 3 refills | Status: DC

## 2024-07-13 NOTE — Telephone Encounter (Addendum)
 Oral Oncology Patient Advocate Encounter  Appeal for Ibrance  Tablets  has been approved for dates: 06/28/2024-10/10/2024 Patient must fill through [facility]   Attempted to inform patient  - LVM     Charlott Hamilton,  CPhT-Adv  she/her/hers [facility]  [facility] Specialty Pharmacy Services Pharmacy Technician Patient Advocate Specialist III [facility] Phone: [phone]  Fax: [phone] [email or web address]

## 2024-07-13 NOTE — Telephone Encounter (Signed)
 " [facility]        Telephone: [phone]?Fax: [phone]   Oncology Clinical Pharmacist Practitioner Initial Assessment   Paige Foster is a 58 y.o. female with a diagnosis of breast cancer. They were contacted today via telephone visit.  I connected with Paige Foster today by telephone and verified that I was speaking with the correct person using two patient identifiers. I discussed the limitations, risks, security and privacy concerns of performing an evaluation and management service by telemedicine and the availability of in-person appointments. The patient/caregiver expressed understanding and agreed to proceed.  Other persons participating in the visit and their role in the encounter: none   Patients location: home  Providers location: clinic  Indication/Regimen Palbociclib  (Ibrance ) is being used appropriately for treatment of metastatic breast cancer by Dr. Amber Stalls.      Wt Readings from Last 1 Encounters:  07/02/24 122 lb 8 oz (55.6 kg)    Estimated body surface area is 1.58 meters squared as calculated from the following:   Height as of 06/29/24: 5' 4 (1.626 m).   Weight as of 07/02/24: 122 lb 8 oz (55.6 kg).  The dosing regimen is 125 mg by mouth daily on days 1 to 21 of a 28-day cycle. This is being given  in combination with pertuzumab  + trastuzumab  + anastrozole . It is planned to continue until disease progression or unacceptable toxicity. Prescription dose and frequency assessed for appropriateness. The treatment goal is: Control.  Patient has agreed to treatment which is documented in physician note on 06/29/24. Counseled patient on administration, dosing, side effects, monitoring, drug-food interactions, safe handling, storage, and disposal.  Prescription has been sent to [facility] and patient is currently working with them and manufacturer to obtain drug. We discussed starting once received and she will see Dr. Stalls with  labs prior at her next administration of pertuzumab  and trastuzumab  on 07/23/24. She is next due for zoledronic  acid in February.  Dose Modifications None   Access Assessment Paige Foster will be receiving palbociclib  through [facility] Concerns: working on getting drug through manufacturer with [facility] Start date if known: TBD  Adherence Assessment Reviewed importance on keeping a med schedule and plan for any missed doses Barriers to adherence identified? No  Communication and Learning Assessment Primary learner: Patient Barriers to learning: No barriers Preferred language: English Learning preferences: Listening   Allergies Allergies[1]  Vitals    07/02/2024   12:49 PM 07/02/2024   10:41 AM 06/29/2024    2:35 PM  Oncology Vitals  Height   163 cm  Weight  55.566 kg 55.021 kg  Weight (lbs)  122 lbs 8 oz 121 lbs 5 oz  BMI  21.03 kg/m2 20.82 kg/m2  Temp  98.1 F (36.7 C) 98.1 F (36.7 C)  Pulse Rate 68 81 68  BP 106/67 97/70 111/57  Resp 14 16 18   SpO2 100 % 100 % 100 %  BSA (m2)  1.58 m2 1.58 m2     Laboratory Data    Latest Ref Rng & Units 06/29/2024    2:19 PM 06/11/2024   10:18 AM 05/21/2024   11:49 AM  CBC EXTENDED  WBC 4.0 - 10.5 K/uL 3.8  6.5  5.6   RBC 3.87 - 5.11 MIL/uL 3.32  3.21  3.37   Hemoglobin 12.0 - 15.0 g/dL 89.2  89.6  88.8   HCT 36.0 - 46.0 % 31.5  30.3  32.1   Platelets  150 - 400 K/uL 248  282  299   NEUT# 1.7 - 7.7 K/uL 2.7  5.0  4.0   Lymph# 0.7 - 4.0 K/uL 0.6  0.7  0.7        Latest Ref Rng & Units 06/29/2024    2:19 PM 06/11/2024   10:18 AM 05/21/2024   11:49 AM  CMP  Glucose 70 - 99 mg/dL 895  83  87   BUN 6 - 20 mg/dL 14  12  15    Creatinine 0.44 - 1.00 mg/dL 9.42  9.43  9.36   Sodium 135 - 145 mmol/L 138  137  137   Potassium 3.5 - 5.1 mmol/L 3.8  3.9  4.5   Chloride 98 - 111 mmol/L 106  105  105   CO2 22 - 32 mmol/L 24  26  28    Calcium  8.9 - 10.3 mg/dL 9.1  9.3  9.7   Total Protein 6.5 - 8.1 g/dL  6.6  6.4  6.9   Total Bilirubin 0.0 - 1.2 mg/dL <9.7  0.3  0.2   Alkaline Phos 38 - 126 U/L 117  113  126   AST 15 - 41 U/L 28  24  20    ALT 0 - 44 U/L 24  22  21     Lab Results  Component Value Date   MG 1.6 (L) 01/05/2024   MG 1.8 12/17/2023   MG 1.7 12/15/2023   Lab Results  Component Value Date   CA2729 27.9 10/27/2023     Contraindications Contraindications were reviewed? Yes Contraindications to therapy were identified? No   Safety Precautions The following safety precautions for the use of palbociclib  were reviewed:  Fever: reviewed the importance of having a thermometer and the Centers for Disease Control and Prevention (CDC) definition of fever which is 100.40F (38C) or higher. Patient should call 24/7 triage at [phone] if experiencing a fever or any other symptoms Decreased white blood cells (WBCs) and increased risk for infection Decreased hemoglobin, part of the red blood cells that carry iron and oxygen Decreased platelet count and increased risk for bleeding Changes in liver function Fatigue Nausea or vomiting Hair loss (alopecia) Mouth irritation or sores Pneumonitis / ILD Handling body fluids and wastes Storage and handling Missed doses Avoid grapefruit products  Medication Reconciliation Current Outpatient Medications  Medication Sig Dispense Refill   ALPRAZolam  (XANAX ) 0.5 MG tablet Take 1 tablet (0.5 mg total) by mouth at bedtime as needed for anxiety. 30 tablet 0   brimonidine  (ALPHAGAN ) 0.2 % ophthalmic solution Place 1 drop into both eyes 3 (three) times daily.     dorzolamide -timolol  (COSOPT ) 2-0.5 % ophthalmic solution 1 drop 2 (two) times daily.     lidocaine -prilocaine  (EMLA ) cream Apply to affected area 1 hour before access prn 30 g 3   omeprazole  (PRILOSEC) 20 MG capsule Take 1 capsule (20 mg total) by mouth daily. 90 capsule 1   ondansetron  (ZOFRAN ) 8 MG tablet Take 1 tablet (8 mg total) by mouth every 8 (eight) hours as needed for  nausea or vomiting. 20 tablet 3   pertuzumab  (PERJETA ) 420 MG/14ML SOLN Inject 420 mg into the vein once.     rosuvastatin  (CRESTOR ) 5 MG tablet Take 1 tablet (5 mg total) by mouth daily. 90 tablet 3   trastuzumab -anns 336 mg in sodium chloride  0.9 % 250 mL Inject 336 mg into the vein once.     zoledronic  acid (ZOMETA ) 4 MG/5ML injection Inject 4 mg into the  vein once.     amLODipine  (NORVASC ) 10 MG tablet Take 1 tablet by mouth daily. (Patient not taking: Reported on 07/13/2024) 90 tablet 2   dexamethasone  (DECADRON ) 4 MG tablet Take 2 tabs by mouth 2 times daily starting day before chemo. Then take 2 tabs daily for 2 days starting day after chemo. Take with food. (Patient not taking: Reported on 07/13/2024) 30 tablet 1   losartan  (COZAAR ) 50 MG tablet TAKE 1 TABLET(50 MG) BY MOUTH DAILY (Patient not taking: Reported on 07/13/2024) 90 tablet 3   palbociclib  (IBRANCE ) 125 MG tablet Take 1 tablet (125 mg total) by mouth daily. Take for 21 days on, 7 days off, repeat every 28 days. (Patient not taking: Reported on 07/13/2024) 21 tablet 3   prochlorperazine  (COMPAZINE ) 10 MG tablet Take 1 tablet (10 mg total) by mouth every 6 (six) hours as needed for nausea or vomiting. (Patient not taking: Reported on 07/13/2024) 30 tablet 1   No current facility-administered medications for this visit.    Medication reconciliation is based on the patient's most recent medication list in the electronic medical record (EMR) including herbal products and OTC medications.   The patient's medication list was reviewed today with the patient? Yes   Drug-drug interactions (DDIs) DDIs were evaluated? Yes Significant DDIs identified? No   Drug-Food Interactions Drug-food interactions were evaluated? Yes Drug-food interactions identified? Grapefruit products  Follow-up Plan  Patient education handout given to patient Start palbociclib  125 mg by mouth daily. Take for 21 days, followed by a 7-day rest period (28-day  supply). She is working with [employer] to obtain drug. Continue anastrozole  1 mg by mouth daily Continue trastuzumab  6 mg/kg IV every 21 days. Next due 07/23/24 Continue pertuzumab  420 mg IV every 21 days. Next due 07/23/24 Continue zoledronic  acid 4 mg IV every 12 weeks. Last given 06/11/24, next due 09/02/24 Monitor for side effects Distress thermometer not done as patient has had prior treatment at [facility].   She will have labs, see Dr. Loretha, and pertuzumab /trastuzumab  on 07/23/24. Paige Foster can follow up with clinical pharmacy as deemed necessary by Dr. Amber Iruku going forward   Paige Foster participated in the discussion, expressed understanding, and voiced agreement with the above plan. All questions were answered to her satisfaction. The patient was advised to contact the clinic at [phone] with any questions or concerns prior to her return visit.   I spent 30 minutes assessing the patient.  Patriciann Becht A. Foster, PharmD, BCOP, CPP  Paige Foster, RPH-CPP, 07/13/2024 2:43 PM  **Disclaimer: This note was dictated with voice recognition software. Similar sounding words can inadvertently be transcribed and this note may contain transcription errors which may not have been corrected upon publication of note.**      [1]  Allergies Allergen Reactions   Bee Pollen Itching    Watery eyes and nose running   "

## 2024-07-23 ENCOUNTER — Inpatient Hospital Stay

## 2024-07-23 ENCOUNTER — Other Ambulatory Visit: Payer: Self-pay | Admitting: Hematology and Oncology

## 2024-07-23 ENCOUNTER — Inpatient Hospital Stay: Admitting: Hematology and Oncology

## 2024-07-23 VITALS — BP 115/62 | HR 58 | Temp 98.1°F | Resp 18 | Ht 64.0 in | Wt 120.8 lb

## 2024-07-23 DIAGNOSIS — C50912 Malignant neoplasm of unspecified site of left female breast: Secondary | ICD-10-CM

## 2024-07-23 DIAGNOSIS — Z170 Estrogen receptor positive status [ER+]: Secondary | ICD-10-CM

## 2024-07-23 DIAGNOSIS — C7951 Secondary malignant neoplasm of bone: Secondary | ICD-10-CM | POA: Diagnosis not present

## 2024-07-23 DIAGNOSIS — Z79899 Other long term (current) drug therapy: Secondary | ICD-10-CM | POA: Diagnosis not present

## 2024-07-23 DIAGNOSIS — Z9013 Acquired absence of bilateral breasts and nipples: Secondary | ICD-10-CM | POA: Diagnosis not present

## 2024-07-23 DIAGNOSIS — C787 Secondary malignant neoplasm of liver and intrahepatic bile duct: Secondary | ICD-10-CM | POA: Diagnosis not present

## 2024-07-23 DIAGNOSIS — Z79811 Long term (current) use of aromatase inhibitors: Secondary | ICD-10-CM | POA: Diagnosis not present

## 2024-07-23 DIAGNOSIS — C50412 Malignant neoplasm of upper-outer quadrant of left female breast: Secondary | ICD-10-CM | POA: Diagnosis not present

## 2024-07-23 DIAGNOSIS — Z5111 Encounter for antineoplastic chemotherapy: Secondary | ICD-10-CM | POA: Diagnosis not present

## 2024-07-23 LAB — CBC WITH DIFFERENTIAL (CANCER CENTER ONLY)
Abs Immature Granulocytes: 0 K/uL (ref 0.00–0.07)
Basophils Absolute: 0 K/uL (ref 0.0–0.1)
Basophils Relative: 0 %
Eosinophils Absolute: 0 K/uL (ref 0.0–0.5)
Eosinophils Relative: 1 %
HCT: 31.2 % — ABNORMAL LOW (ref 36.0–46.0)
Hemoglobin: 10.9 g/dL — ABNORMAL LOW (ref 12.0–15.0)
Immature Granulocytes: 0 %
Lymphocytes Relative: 26 %
Lymphs Abs: 0.9 K/uL (ref 0.7–4.0)
MCH: 32.7 pg (ref 26.0–34.0)
MCHC: 34.9 g/dL (ref 30.0–36.0)
MCV: 93.7 fL (ref 80.0–100.0)
Monocytes Absolute: 0.5 K/uL (ref 0.1–1.0)
Monocytes Relative: 13 %
Neutro Abs: 2.2 K/uL (ref 1.7–7.7)
Neutrophils Relative %: 60 %
Platelet Count: 207 K/uL (ref 150–400)
RBC: 3.33 MIL/uL — ABNORMAL LOW (ref 3.87–5.11)
RDW: 14.2 % (ref 11.5–15.5)
WBC Count: 3.6 K/uL — ABNORMAL LOW (ref 4.0–10.5)
nRBC: 0 % (ref 0.0–0.2)

## 2024-07-23 LAB — CMP (CANCER CENTER ONLY)
ALT: 35 U/L (ref 0–44)
AST: 39 U/L (ref 15–41)
Albumin: 4.3 g/dL (ref 3.5–5.0)
Alkaline Phosphatase: 122 U/L (ref 38–126)
Anion gap: 11 (ref 5–15)
BUN: 16 mg/dL (ref 6–20)
CO2: 24 mmol/L (ref 22–32)
Calcium: 9.6 mg/dL (ref 8.9–10.3)
Chloride: 104 mmol/L (ref 98–111)
Creatinine: 0.61 mg/dL (ref 0.44–1.00)
GFR, Estimated: >60 mL/min
Glucose, Bld: 92 mg/dL (ref 70–99)
Potassium: 4.3 mmol/L (ref 3.5–5.1)
Sodium: 139 mmol/L (ref 135–145)
Total Bilirubin: 0.2 mg/dL (ref 0.0–1.2)
Total Protein: 7.1 g/dL (ref 6.5–8.1)

## 2024-07-23 MED ORDER — SODIUM CHLORIDE 0.9 % IV SOLN
420.0000 mg | Freq: Once | INTRAVENOUS | Status: AC
  Administered 2024-07-23: 420 mg via INTRAVENOUS
  Filled 2024-07-23: qty 14

## 2024-07-23 MED ORDER — DIPHENHYDRAMINE HCL 25 MG PO CAPS
50.0000 mg | ORAL_CAPSULE | Freq: Once | ORAL | Status: AC
  Administered 2024-07-23: 50 mg via ORAL
  Filled 2024-07-23: qty 2

## 2024-07-23 MED ORDER — TRASTUZUMAB-ANNS CHEMO 150 MG IV SOLR
6.0000 mg/kg | Freq: Once | INTRAVENOUS | Status: AC
  Administered 2024-07-23: 336 mg via INTRAVENOUS
  Filled 2024-07-23: qty 16

## 2024-07-23 MED ORDER — ANASTROZOLE 1 MG PO TABS: 1.0000 mg | ORAL_TABLET | Freq: Every day | ORAL | 3 refills | Status: AC

## 2024-07-23 MED ORDER — ACETAMINOPHEN 325 MG PO TABS
650.0000 mg | ORAL_TABLET | Freq: Once | ORAL | Status: AC
  Administered 2024-07-23: 650 mg via ORAL
  Filled 2024-07-23: qty 2

## 2024-07-23 MED ORDER — SODIUM CHLORIDE 0.9 % IV SOLN: INTRAVENOUS | Status: DC

## 2024-07-23 NOTE — Patient Instructions (Signed)
 [facility] - A DEPT OF [place]  Discharge Instructions: Thank you for choosing [facility] to provide your oncology and hematology care.   If you have a lab appointment with the Cancer Center, please go directly to the Cancer Center and check in at the registration area.   Wear comfortable clothing and clothing appropriate for easy access to any Portacath or PICC line.   We strive to give you quality time with your provider. You may need to reschedule your appointment if you arrive late (15 or more minutes).  Arriving late affects you and other patients whose appointments are after yours.  Also, if you miss three or more appointments without notifying the office, you may be dismissed from the clinic at the providers discretion.      For prescription refill requests, have your pharmacy contact our office and allow 72 hours for refills to be completed.    Today you received the following chemotherapy and/or immunotherapy agents: trastuzumab -anns and pertuzumab       To help prevent nausea and vomiting after your treatment, we encourage you to take your nausea medication as directed.  BELOW ARE SYMPTOMS THAT SHOULD BE REPORTED IMMEDIATELY: *FEVER GREATER THAN 100.4 F (38 C) OR HIGHER *CHILLS OR SWEATING *NAUSEA AND VOMITING THAT IS NOT CONTROLLED WITH YOUR NAUSEA MEDICATION *UNUSUAL SHORTNESS OF BREATH *UNUSUAL BRUISING OR BLEEDING *URINARY PROBLEMS (pain or burning when urinating, or frequent urination) *BOWEL PROBLEMS (unusual diarrhea, constipation, pain near the anus) TENDERNESS IN MOUTH AND THROAT WITH OR WITHOUT PRESENCE OF ULCERS (sore throat, sores in mouth, or a toothache) UNUSUAL RASH, SWELLING OR PAIN  UNUSUAL VAGINAL DISCHARGE OR ITCHING   Items with * indicate a potential emergency and should be followed up as soon as possible or go to the Emergency Department if any problems should occur.  Please show the CHEMOTHERAPY ALERT  CARD or IMMUNOTHERAPY ALERT CARD at check-in to the Emergency Department and triage nurse.  Should you have questions after your visit or need to cancel or reschedule your appointment, please contact [facility] - A DEPT OF [facility]  Dept: [phone]  and follow the prompts.  Office hours are 8:00 a.m. to 4:30 p.m. Monday - Friday. Please note that voicemails left after 4:00 p.m. may not be returned until the following business day.  We are closed weekends and major holidays. You have access to a nurse at all times for urgent questions. Please call the main number to the clinic Dept: [phone] and follow the prompts.   For any non-urgent questions, you may also contact your provider using MyChart. We now offer e-Visits for anyone 34 and older to request care online for non-urgent symptoms. For details visit [email or web address].   Also download the MyChart app! Go to the app store, search MyChart, open the app, select [place], and log in with your MyChart username and password.

## 2024-07-23 NOTE — Progress Notes (Signed)
 " [facility]  [phone] Fax:([phone]    ID: Paige Foster DOB: 09-05-1965  MR#: [id number]  RDW#:[id number]  Patient Care Team: Paige Roselie Rockford, NP as PCP - General (Internal Medicine) Paige Lonni CROME, MD as PCP - Cardiology (Cardiology) Paige Cough, MD as Consulting Physician (General Surgery) Paige Baptist, MD as Consulting Physician (Ophthalmology) Paige Victory Foster MOULD, MD as Consulting Physician (Gastroenterology) Paige Foster (Optometry) Paige Ash, MD as Consulting Physician (Hematology and Oncology) Paige Domino, MD as Attending Physician (Radiation Oncology)  CHIEF COMPLAINT: Estrogen and HER-2 positive breast cancer (s/p left mastectomy)  CURRENT TREATMENT: docetaxel , herceptin  and perjeta   INTERVAL HISTORY:  History of Present Illness Paige Foster is a 58 year old female with 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 She completed 8 cycles of docetaxel  and is currently on HP maintenance.  She initiated palbociclib  therapy today and will resume anastrozole  as directed. She denies pain, bone pain, dyspnea, Foster, dysuria, and does not require analgesics. She remains active and able to work without significant discomfort. Bowel movements are satisfactory, without daily diarrhea. She continues to experience persistent epiphora and rhinorrhea since her last visit.  Recent imaging on November 24 demonstrated a slight interval increase in the size of several hepatic metastases. We plan to repeat the scans in January. She expressed concern regarding the frequency and cost of scans but understands the importance of it.  Rest of the pertinent 10 point ROS reviewed and neg.  HISTORY OF CURRENT ILLNESS: From the original intake note:  Paige Foster has a prior history of left breast cancer, dating back to 2004. At that time  she underwent a left mastectomy for stage 0 (noninvasive) breast cancer, with transverse rectus abdominis (TRAM) flap construction under Dr. Marcus. She also underwent a right breast reduction. She took tamoxifen for three years.  More recently she underwent bilateral diagnostic mammography with tomography and left breast ultrasonography at [facility] on 01/03/2018 showing: Breast Density Category B. There is an oval fat containing lesion in the left breast upper outer quadrant posterior depth. No other significant masses, calcifications, or other findings are seen in either breast. Sonographically, there is a 1.5 cm lesion in the left breast upper outer quadrant posterior depth. This lesion is of mixed echogenicity. This correlates as palpated and with mammography findings. Follow up was recommended.  Close follow-up was suggested.  She then presented with a non-tender mass in the left reconstructed breast on 08/30/2018. On physical exam, there is a hard palpable lump measuring 2.0 cm in the upper outer left reconstructed breast 10 cm from the expected location of a nipple. Sonography over this area demonstrates a 1.8 cm x 1.7 cm x 1.4 cm mass in the left breast at 2 o'clock posterior depth 10 cm from the nipple. This mass is of mixed echogenicity. This abnormality is increased in size and correlates as palpated and with prior mammography findings. Color flow imaging demonstrates that there is vascularity present. Elastography imaging assessment is intermediate. No significant abnormalities were seen sonographically in the left axilla.    Accordingly on 08/30/2018 she proceeded to biopsy of the left breast mass in question. The pathology from this procedure showed ([id number]): invasive ductal carcinoma, grade III. Prognostic indicators significant for: estrogen receptor, 100% positive with strong staining intensity and progesterone receptor, 0% negative. Proliferation marker Ki67 at 15%. HER2 positive (3+) by  immunohistochemistry.  The patient's subsequent history  is as detailed below.   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    Port-A-Cath in place 09/28/2018   Recurrent breast cancer, left [facility]) oncologist-- dr layla    dx 2004, noninvasive Stage 0 ----s/p left mastectomy w/ tram flap construction (and right breast reduction), taken Tamoxifen for 3 yrs;   08-30-2018 recurrent left cancer , Grade III,  cT1c,  ER positive, PR negative, HER-2 positive, invasive ductal carcinoma-- neoadjuvant chemo to start 09-26-2018   Renal artery stenosis    mild right external renal artery stenosis per duplex in epic 08-09-2013   Wears glasses     PAST SURGICAL HISTORY: Past Surgical History:  Procedure Laterality Date   BREAST LUMPECTOMY WITH RADIOACTIVE SEED LOCALIZATION Left 02/20/2019   Procedure: LEFT BREAST LUMPECTOMY WITH RADIOACTIVE SEED LOCALIZATION;  Surgeon: Paige Cough, MD;  Location: [facility] OR;  Service: General;  Laterality: Left;   BREAST SURGERY Left    Transflap   COLONOSCOPY     COLONOSCOPY     IR IMAGING GUIDED PORT INSERTION  11/25/2023   LEFT HEART CATH AND CORONARY ANGIOGRAPHY N/A 12/16/2023   Procedure: LEFT HEART CATH AND CORONARY ANGIOGRAPHY;  Surgeon: Paige Foster, Paige M, MD;  Location: [facility] INVASIVE CV LAB;  Service: Cardiovascular;  Laterality: N/A;   MASTECTOMY Left 2004   w/  TRAM flap construction and right breast augmentation with abdominoplasy   PARS PLANA VITRECTOMY Left 12/18/2018   Procedure: PARS PLANA VITRECTOMY WITH 25 GAUGE, ENDOLASER;  Surgeon: Paige Baptist, MD;  Location: [facility] OR;  Service: Ophthalmology;  Laterality: Left;   PORTACATH PLACEMENT N/A 09/25/2018   Procedure: INSERTION PORT-A-CATH WITH ULTRASOUND;  Surgeon: Paige Cough, MD;  Location: [facility] ORS;  Service:  General;  Laterality: N/A;   TUBAL LIGATION Bilateral yrs ago    FAMILY HISTORY: Family History  Problem Relation Age of Onset   Diabetes Mother    Hypertension Mother    Kidney disease Father    Colon cancer Neg Hx    Colon polyps Neg Hx    Gallbladder disease Neg Hx    Heart disease Neg Hx    Esophageal cancer Neg Hx    Stomach cancer Neg Hx    Rectal cancer Neg Hx   Gracelee's father died from unknown causes in his early 36's. Patients' mother died from diabetes complications at age 49. The patient has 1 sister. Patient denies anyone in her family having breast, ovarian, prostate, or pancreatic cancer.    GYNECOLOGIC HISTORY:  Patient's last menstrual period was 06/08/2007. 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 Her husband, Zachary, works at [employer]. Alaira has two children, Fonda and Alm. Fonda lives with her, is 67, and it attending [employer] for a computer based degree. Alm lives with her, is 68, and recently graduated from [employer] with a degree in Pension Scheme Manager.  He works for [employer]. Samyuktha has no grandchildren. She attends the [employer].   ADVANCED DIRECTIVES: In the absence of any documents to the contrary her husband, Zachary, is automatically her healthcare power of attorney  HEALTH MAINTENANCE: Social History   Tobacco Use   Smoking status: Never    Passive exposure: Never   Smokeless tobacco: Never  Vaping Use   Vaping status: Never Used  Substance Use Topics   Alcohol use: Not Currently    Alcohol/week: 0.0 standard drinks of alcohol    Comment: Occassionally   Drug use: No    Colonoscopy: April 2022, danis  PAP: January 2022, N[facility]  Bone density:  2017; -1.6, osteopenic   Allergies  Allergen Reactions   Bee Pollen Itching    Watery eyes and nose  running    Current Outpatient Medications  Medication Sig Dispense Refill   ALPRAZolam  (XANAX ) 0.5 MG tablet Take 1 tablet (0.5 mg total) by mouth at bedtime as needed for anxiety. 30 tablet 0   amLODipine  (NORVASC ) 10 MG tablet Take 1 tablet by mouth daily. (Patient not taking: Reported on 07/13/2024) 90 tablet 2   anastrozole  (ARIMIDEX ) 1 MG tablet Take 1 tablet (1 mg total) by mouth daily. 90 tablet 3   brimonidine  (ALPHAGAN ) 0.2 % ophthalmic solution Place 1 drop into both eyes 3 (three) times daily.     dexamethasone  (DECADRON ) 4 MG tablet Take 2 tabs by mouth 2 times daily starting day before chemo. Then take 2 tabs daily for 2 days starting day after chemo. Take with food. (Patient not taking: Reported on 07/13/2024) 30 tablet 1   dorzolamide -timolol  (COSOPT ) 2-0.5 % ophthalmic solution 1 drop 2 (two) times daily.     lidocaine -prilocaine  (EMLA ) cream Apply to affected area 1 hour before access prn 30 g 3   losartan  (COZAAR ) 50 MG tablet TAKE 1 TABLET(50 MG) BY MOUTH DAILY (Patient not taking: Reported on 07/13/2024) 90 tablet 3   omeprazole  (PRILOSEC) 20 MG capsule Take 1 capsule (20 mg total) by mouth daily. 90 capsule 1   ondansetron  (ZOFRAN ) 8 MG tablet Take 1 tablet (8 mg total) by mouth every 8 (eight) hours as needed for nausea or vomiting. 20 tablet 3   palbociclib  (IBRANCE ) 125 MG tablet Take 1 tablet (125 mg total) by mouth daily. Take for 21 days on, 7 days off, repeat every 28 days. 21 tablet 3   pertuzumab  (PERJETA ) 420 MG/14ML SOLN Inject 420 mg into the vein once.     prochlorperazine  (COMPAZINE ) 10 MG tablet Take 1 tablet (10 mg total) by mouth every 6 (six) hours as needed for nausea or vomiting. (Patient not taking: Reported on 07/13/2024) 30 tablet 1   rosuvastatin  (CRESTOR ) 5 MG tablet Take 1 tablet (5 mg total) by mouth daily. 90 tablet 3   trastuzumab -anns 336 mg in sodium chloride  0.9 % 250 mL Inject 336 mg into the vein once.     zoledronic  acid (ZOMETA ) 4 MG/5ML  injection Inject 4 mg into the vein once.     No current facility-administered medications for this visit.     OBJECTIVE: African-American woman who appears stated age  Vitals:   07/23/24 1123  BP: 115/62  Pulse: (!) 58  Resp: 18  Temp: 98.1 F (36.7 C)  SpO2: 99%        Body mass index is 20.74 kg/Foster.   Wt Readings from Last 3 Encounters:  07/23/24 120 lb 12.8 oz (54.8 kg)  07/02/24 122 lb 8 oz (55.6 kg)  06/29/24 121 lb 4.8 oz (55 kg)   Physical Exam Constitutional:      Appearance: Normal appearance.  Cardiovascular:     Rate and Rhythm: Normal rate and regular rhythm.  Pulses: Normal pulses.     Heart sounds: Normal heart sounds.  Pulmonary:     Effort: Pulmonary effort is normal.     Breath sounds: Normal breath sounds.  Musculoskeletal:        General: No swelling.     Cervical back: Normal range of motion and neck supple. No rigidity.  Lymphadenopathy:     Cervical: No cervical adenopathy.  Skin:    General: Skin is warm and dry.  Neurological:     General: No focal deficit present.     Mental Status: She is alert.       LAB RESULTS:  CMP     Component Value Date/Time   NA 139 07/23/2024 1035   NA 140 10/12/2017 0950   K 4.3 07/23/2024 1035   CL 104 07/23/2024 1035   CO2 24 07/23/2024 1035   GLUCOSE 92 07/23/2024 1035   BUN 16 07/23/2024 1035   BUN 10 10/12/2017 0950   CREATININE 0.61 07/23/2024 1035   CALCIUM  9.6 07/23/2024 1035   PROT 7.1 07/23/2024 1035   PROT 7.5 10/12/2017 0950   ALBUMIN 4.3 07/23/2024 1035   ALBUMIN 4.3 10/12/2017 0950   AST 39 07/23/2024 1035   ALT 35 07/23/2024 1035   ALKPHOS 122 07/23/2024 1035   BILITOT 0.2 07/23/2024 1035   GFRNONAA >60 07/23/2024 1035   GFRAA >60 12/06/2019 1441   GFRAA >60 09/07/2018 1455   Lab Results  Component Value Date   WBC 3.6 (L) 07/23/2024   NEUTROABS 2.2 07/23/2024   HGB 10.9 (L) 07/23/2024   HCT 31.2 (L) 07/23/2024   MCV 93.7 07/23/2024   PLT 207 07/23/2024    Lab  Results  Component Value Date   LABCA2 <4 08/30/2007    No components found for: [id number]  No results for input(s): INR in the last 168 hours.   Lab Results  Component Value Date   LABCA2 <4 08/30/2007    No results found for: CAN199  No results found for: CAN125  Lab Results  Component Value Date   CAN153 23.9 10/27/2023    Lab Results  Component Value Date   CA2729 27.9 10/27/2023    No components found for: HGQUANT  No results found for: CEA1, CEA / No results found for: CEA1, CEA  No results found for: AFPTUMOR  No results found for: CHROMOGRNA  No results found for: TOTALPROTELP, ALBUMINELP, A1GS, A2GS, BETS, BETA2SER, GAMS, MSPIKE, SPEI (this displays SPEP labs)  No results found for: KPAFRELGTCHN, LAMBDASER, KAPLAMBRATIO (kappa/lambda light chains)  No results found for: HGBA, HGBA2QUANT, HGBFQUANT, HGBSQUAN (Hemoglobinopathy evaluation)   Lab Results  Component Value Date   LDH 169 08/30/2007    Lab Results  Component Value Date   IRON 96 07/11/2023   TIBC 382 07/11/2023   IRONPCTSAT 25 07/11/2023   (Iron and TIBC)  Lab Results  Component Value Date   FERRITIN 261 07/11/2023    Urinalysis    Component Value Date/Time   COLORURINE STRAW (A) 12/15/2023 0809   APPEARANCEUR CLEAR 12/15/2023 0809   LABSPEC 1.010 12/15/2023 0809   PHURINE 5.0 12/15/2023 0809   GLUCOSEU NEGATIVE 12/15/2023 0809   HGBUR NEGATIVE 12/15/2023 0809   HGBUR large 04/09/2008 1548   BILIRUBINUR NEGATIVE 12/15/2023 0809   KETONESUR NEGATIVE 12/15/2023 0809   PROTEINUR NEGATIVE 12/15/2023 0809   UROBILINOGEN 0.2 04/09/2008 1548   NITRITE NEGATIVE 12/15/2023 0809   LEUKOCYTESUR NEGATIVE 12/15/2023 0809    STUDIES:  ECHOCARDIOGRAM COMPLETE Result Date: 07/02/2024  ECHOCARDIOGRAM REPORT   Patient Name:   Paige Foster Date of Exam: 07/02/2024 Medical Rec #:  [id number]       Height:       64.0 in Accession  #:    [id number]      Weight:       121.3 lb Date of Birth:  July 18, 1966       BSA:          1.582 Foster Patient Age:    58 years        BP:           121/74 mmHg Patient Gender: F               HR:           77 bpm. Exam Location:  Outpatient Procedure: 2D Echo and Strain Analysis (Both Spectral and Color Flow Doppler            were utilized during procedure). Indications:    Chemo  History:        Patient has prior history of Echocardiogram examinations.                 Signs/Symptoms:Chemo.  Sonographer:    Norleen Amour Referring Phys: 64 LINDSEY CORNETTO CAUSEY IMPRESSIONS  1. Left ventricular ejection fraction, by estimation, is 60 to 65%. The left ventricle has normal function. The left ventricle has no regional wall motion abnormalities. Left ventricular diastolic parameters are consistent with Grade I diastolic dysfunction (impaired relaxation). The average left ventricular global longitudinal strain is -23.3 %. The global longitudinal strain is normal.  2. Right ventricular systolic function is normal. The right ventricular size is normal. There is normal pulmonary artery systolic pressure.  3. Left atrial size was moderately dilated.  4. Right atrial size was moderately dilated.  5. The mitral valve is normal in structure. Trivial mitral valve regurgitation. No evidence of mitral stenosis.  6. The aortic valve is tricuspid. Aortic valve regurgitation is not visualized. No aortic stenosis is present.  7. The inferior vena cava is normal in size with greater than 50% respiratory variability, suggesting right atrial pressure of 3 mmHg. FINDINGS  Left Ventricle: Left ventricular ejection fraction, by estimation, is 60 to 65%. The left ventricle has normal function. The left ventricle has no regional wall motion abnormalities. The average left ventricular global longitudinal strain is -23.3 %. Strain was performed and the global longitudinal strain is normal. The left ventricular internal cavity size was normal  in size. There is no left ventricular hypertrophy. Left ventricular diastolic parameters are consistent with Grade I diastolic dysfunction (impaired relaxation). Normal left ventricular filling pressure. Right Ventricle: The right ventricular size is normal. No increase in right ventricular wall thickness. Right ventricular systolic function is normal. There is normal pulmonary artery systolic pressure. The tricuspid regurgitant velocity is 2.44 Foster/s, and  with an assumed right atrial pressure of 3 mmHg, the estimated right ventricular systolic pressure is 26.8 mmHg. Left Atrium: Left atrial size was moderately dilated. Right Atrium: Right atrial size was moderately dilated. Pericardium: There is no evidence of pericardial effusion. Mitral Valve: The mitral valve is normal in structure. Trivial mitral valve regurgitation. No evidence of mitral valve stenosis. MV peak gradient, 3.3 mmHg. The mean mitral valve gradient is 2.0 mmHg. Tricuspid Valve: The tricuspid valve is normal in structure. Tricuspid valve regurgitation is mild . No evidence of tricuspid stenosis. Aortic Valve: The aortic valve is tricuspid. Aortic valve regurgitation is not visualized. No  aortic stenosis is present. Aortic valve mean gradient measures 7.0 mmHg. Aortic valve peak gradient measures 13.0 mmHg. Aortic valve area, by VTI measures 1.75  cm. Pulmonic Valve: The pulmonic valve was normal in structure. Pulmonic valve regurgitation is not visualized. No evidence of pulmonic stenosis. Aorta: The aortic root is normal in size and structure. Venous: The inferior vena cava is normal in size with greater than 50% respiratory variability, suggesting right atrial pressure of 3 mmHg. IAS/Shunts: No atrial level shunt detected by color flow Doppler.  LEFT VENTRICLE PLAX 2D LVIDd:         5.20 cm     Diastology LVIDs:         2.90 cm     LV e' medial:    11.90 cm/s LV PW:         0.90 cm     LV E/e' medial:  6.5 LV IVS:        0.80 cm     LV e'  lateral:   16.50 cm/s LVOT diam:     1.90 cm     LV E/e' lateral: 4.7 LV SV:         63 LV SV Index:   40          2D Longitudinal Strain LVOT Area:     2.84 cm    2D Strain GLS (A4C):   -22.9 %                            2D Strain GLS (A3C):   -30.4 %                            2D Strain GLS (A2C):   -16.7 % LV Volumes (MOD)           2D Strain GLS Avg:     -23.3 % LV vol d, MOD A2C: 81.6 ml LV vol d, MOD A4C: 94.8 ml LV vol s, MOD A2C: 29.2 ml LV vol s, MOD A4C: 36.4 ml LV SV MOD A2C:     52.4 ml LV SV MOD A4C:     94.8 ml LV SV MOD BP:      57.3 ml RIGHT VENTRICLE             IVC RV Basal diam:  3.80 cm     IVC diam: 1.80 cm RV S prime:     15.30 cm/s TAPSE (Foster-mode): 2.7 cm      PULMONARY VEINS                             Diastolic Velocity: 50.60 cm/s                             S/D Velocity:       1.40                             Systolic Velocity:  70.30 cm/s LEFT ATRIUM             Index        RIGHT ATRIUM           Index LA diam:        3.60 cm 2.28 cm/Foster   RA Area:  15.90 cm LA Vol (A2C):   61.7 ml 39.01 ml/Foster  RA Volume:   37.80 ml  23.90 ml/Foster LA Vol (A4C):   58.5 ml 36.98 ml/Foster LA Biplane Vol: 61.9 ml 39.13 ml/Foster  AORTIC VALVE                     PULMONIC VALVE AV Area (Vmax):    1.67 cm      PV Vmax:       1.25 Foster/s AV Area (Vmean):   1.68 cm      PV Peak grad:  6.2 mmHg AV Area (VTI):     1.75 cm AV Vmax:           180.00 cm/s AV Vmean:          125.000 cm/s AV VTI:            0.359 Foster AV Peak Grad:      13.0 mmHg AV Mean Grad:      7.0 mmHg LVOT Vmax:         106.00 cm/s LVOT Vmean:        74.100 cm/s LVOT VTI:          0.221 Foster LVOT/AV VTI ratio: 0.62  AORTA Ao Root diam: 2.70 cm Ao Asc diam:  2.60 cm MITRAL VALVE               TRICUSPID VALVE MV Area (PHT): 4.36 cm    TR Peak grad:   23.8 mmHg MV Area VTI:   2.32 cm    TR Vmax:        244.00 cm/s MV Peak grad:  3.3 mmHg MV Mean grad:  2.0 mmHg    SHUNTS MV Vmax:       0.91 Foster/s    Systemic VTI:  0.22 Foster MV Vmean:      68.0 cm/s   Systemic  Diam: 1.90 cm MV Decel Time: 174 msec MV E velocity: 77.10 cm/s MV A velocity: 87.00 cm/s MV E/A ratio:  0.89 Annabella Scarce MD Electronically signed by Annabella Scarce MD Signature Date/Time: 07/02/2024/2:29:03 PM    Final         ELIGIBLE FOR AVAILABLE RESEARCH PROTOCOL: No  ASSESSMENT: 58 y.o. [place], [place] woman  (1) history of left-sided ductal carcinoma in situ 2004  (a) s/p left mastectomy with TRAM reconstruction  (b) status post tamoxifen x3 years  (2) left breast upper outer quadrant biopsy 08/30/2018 shows a clinical T1c N0 invasive ductal carcinoma, grade 3, estrogen receptor strongly positive, progesterone receptor negative, with HER-2 amplification, and and MIB-1 of 15%.   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    (b) chemotherapy discontinued after 4 cycles because of intercurrent eye surgery  (c) continued trastuzumab  and pertuzumab  to complete a year (last dose 09/06/2019)  (d) echocardiogram on 01/23/2019 that shows well preserved EF of 60-65%  (e) echocardiogram on 04/23/2019 shows EF of 60-65%  (f) echocardiogram 08/02/2019 shows an ejection fraction in the 60-65% range  (4) left lumpectomy 02/20/2019 showed a residual  ypT1c NX invasive ductal carcinoma, grade 2 with negative margins.    (5) adjuvant radiation: Radiation Treatment Dates: 04/12/2019 through 05/28/2019 Site Technique Total Dose (  Gy) Dose per Fx (Gy) Completed Fx Beam Energies  Breast: CW_Lt 3D 50.4/50.4 1.8 28/28 6X, 10X  Breast: CW_Lt_SCV_PAB 3D 50.4/50.4 1.8 28/28 6X, 10X  Breast: CW_Lt_Bst Electron 10/10 2 5/5 6X, 10X   (6) anastrozole  started 06/26/2019  (a) DEXA scan  at [facility] 12/24/2015 found a T score of -1.6 DEXA scan from mass June 2023 showed T score of -1.4 at L1-L2  (7) genetics testing 02/03/2019 through the Common Hereditary Cancers Panel offered by [facility] found no deleterious mutations in APC, ATM, AXIN2, BARD1, BMPR1A, BRCA1, BRCA2, BRIP1, CDH1, CDKN2A (p14ARF), CDKN2A (p16INK4a), CKD4, CHEK2, CTNNA1, DICER1, EPCAM (Deletion/duplication testing only), GREM1 (promoter region deletion/duplication testing only), KIT, MEN1, MLH1, MSH2, MSH3, MSH6, MUTYH, NBN, NF1, NHTL1, PALB2, PDGFRA, PMS2, POLD1, POLE, PTEN, RAD50, RAD51C, RAD51D, RNF43, SDHB, SDHC, SDHD, SMAD4, SMARCA4. STK11, TP53, TSC1, TSC2, and VHL.  The following genes were evaluated for sequence changes only: SDHA and HOXB13 c.251G>A variant only.  (8) She had a CXR for unintentional weight loss,  this showed possible left lung pleural based mass and posterior right eighth rib met lesion. CT showed 3.8 x 5.5 x 5 cm left anterior chest wall mass along the mid axillary line with rib involvement and destruction and low-attenuation changes of the center indicating necrotic mass correlates with a neoplastic lesion likely metastatic disease. Liver is enlarged, inhomogeneous with multiple enhancing lesions throughout the right and left lobe of the liver consistent with extensive numerous metastatic liver lesions. Liver appears completely replaced by metastatic disease. Lytic metastatic bone lesions involving the right anterior superior iliac crest, left iliac bone, right inferior pubic ramus, left iliac bone, and L4 vertebral body. Suggestive of lytic bone changes involving the upper thoracic vertebral bodies T1-T2.  PLAN:  Assessment & Plan Metastatic HER2-positive breast cancer She is now on HP only, completed docetaxel  chemotherapy She started Ibrance  today and anastrozole  based on PATINA trial. - Continue antibody infusion therapy. - Order follow-up imaging for end of January. - Reviewed therapy  continuation if disease stable or regresses. - Assessed for adverse effects; she denied pain or significant side effects. - Provided work note. - Scheduled follow-up in four weeks with repeat labs.  Hyperlipidemia No new symptoms or concerns. - Refilled rosuvastatin  prescription.  Time spent: 30 min  Amber Stalls, MD  Medical Oncology and Hematology [facility] [address] [place], [place] [address] Tel. [phone]    Fax. [phone]  *Total Encounter Time as defined by the Centers for Medicare and Medicaid Services includes, in addition to the face-to-face time of a patient visit (documented in the note above) non-face-to-face time: obtaining and reviewing outside history, ordering and reviewing medications, tests or procedures, care coordination (communications with other health care professionals or caregivers) and documentation in the medical record. "

## 2024-07-23 NOTE — Progress Notes (Signed)
 Pt declined to stay after perjeta  for any post observation.

## 2024-07-24 ENCOUNTER — Other Ambulatory Visit: Payer: Self-pay

## 2024-07-30 ENCOUNTER — Other Ambulatory Visit: Payer: Self-pay

## 2024-08-01 ENCOUNTER — Telehealth: Payer: Self-pay | Admitting: Hematology and Oncology

## 2024-08-01 NOTE — Telephone Encounter (Signed)
 I spoke with patient as she called in to cancel port flush w/lab and appointment with Morna Kendall, NP. Patient states she does not want to come in on 08/13/2024, only 08/17/2024.

## 2024-08-02 ENCOUNTER — Encounter: Payer: Self-pay | Admitting: Hematology and Oncology

## 2024-08-02 ENCOUNTER — Inpatient Hospital Stay: Attending: Hematology and Oncology | Admitting: Licensed Clinical Social Worker

## 2024-08-02 ENCOUNTER — Other Ambulatory Visit: Payer: Self-pay | Admitting: Hematology and Oncology

## 2024-08-02 ENCOUNTER — Other Ambulatory Visit: Payer: Self-pay | Admitting: *Deleted

## 2024-08-02 DIAGNOSIS — C50412 Malignant neoplasm of upper-outer quadrant of left female breast: Secondary | ICD-10-CM

## 2024-08-02 DIAGNOSIS — Z5111 Encounter for antineoplastic chemotherapy: Secondary | ICD-10-CM | POA: Insufficient documentation

## 2024-08-02 DIAGNOSIS — Z79811 Long term (current) use of aromatase inhibitors: Secondary | ICD-10-CM | POA: Insufficient documentation

## 2024-08-02 DIAGNOSIS — Z170 Estrogen receptor positive status [ER+]: Secondary | ICD-10-CM | POA: Insufficient documentation

## 2024-08-02 DIAGNOSIS — C787 Secondary malignant neoplasm of liver and intrahepatic bile duct: Secondary | ICD-10-CM | POA: Insufficient documentation

## 2024-08-02 DIAGNOSIS — Z79899 Other long term (current) drug therapy: Secondary | ICD-10-CM | POA: Insufficient documentation

## 2024-08-02 DIAGNOSIS — C7951 Secondary malignant neoplasm of bone: Secondary | ICD-10-CM | POA: Insufficient documentation

## 2024-08-02 DIAGNOSIS — Z9013 Acquired absence of bilateral breasts and nipples: Secondary | ICD-10-CM | POA: Insufficient documentation

## 2024-08-02 MED ORDER — ZOLPIDEM TARTRATE 5 MG PO TABS: 5.0000 mg | ORAL_TABLET | Freq: Every evening | ORAL | 0 refills | Status: AC | PRN

## 2024-08-02 NOTE — Progress Notes (Signed)
 [facility] Clinical Social Work  Clinical Social Work was referred by medical provider for psychiatry needs.  Clinical Social Worker contacted patient by phone to offer support and assess for needs.    Patient is having significant sleep disturbance and is interested in seeing a psychiatrist for additional help in managing this concern.    Interventions: Provided patient with information about local options for psychiatry with openings in the near future  Provided information on [facility] for immediate needs      Follow Up Plan:  Patient will complete intake form on psychiatrist website. Pt will contact CSW if she has difficulty completing the forms    Daaiel Starlin E Aldred Mase, LCSW  Clinical Social Worker [facility]

## 2024-08-13 ENCOUNTER — Inpatient Hospital Stay

## 2024-08-13 ENCOUNTER — Inpatient Hospital Stay: Admitting: Adult Health

## 2024-08-17 ENCOUNTER — Inpatient Hospital Stay: Admitting: Hematology and Oncology

## 2024-08-17 ENCOUNTER — Inpatient Hospital Stay

## 2024-08-17 ENCOUNTER — Inpatient Hospital Stay: Admitting: Physician Assistant

## 2024-08-17 VITALS — BP 108/57 | HR 67 | Temp 97.9°F | Resp 16 | Wt 122.2 lb

## 2024-08-17 VITALS — BP 98/63 | HR 65 | Resp 14

## 2024-08-17 DIAGNOSIS — Z9013 Acquired absence of bilateral breasts and nipples: Secondary | ICD-10-CM | POA: Diagnosis not present

## 2024-08-17 DIAGNOSIS — C50912 Malignant neoplasm of unspecified site of left female breast: Secondary | ICD-10-CM

## 2024-08-17 DIAGNOSIS — C7951 Secondary malignant neoplasm of bone: Secondary | ICD-10-CM | POA: Diagnosis not present

## 2024-08-17 DIAGNOSIS — C50412 Malignant neoplasm of upper-outer quadrant of left female breast: Secondary | ICD-10-CM | POA: Diagnosis not present

## 2024-08-17 DIAGNOSIS — Z79811 Long term (current) use of aromatase inhibitors: Secondary | ICD-10-CM | POA: Diagnosis not present

## 2024-08-17 DIAGNOSIS — C787 Secondary malignant neoplasm of liver and intrahepatic bile duct: Secondary | ICD-10-CM | POA: Diagnosis not present

## 2024-08-17 DIAGNOSIS — Z170 Estrogen receptor positive status [ER+]: Secondary | ICD-10-CM

## 2024-08-17 DIAGNOSIS — Z79899 Other long term (current) drug therapy: Secondary | ICD-10-CM | POA: Diagnosis not present

## 2024-08-17 DIAGNOSIS — Z5111 Encounter for antineoplastic chemotherapy: Secondary | ICD-10-CM | POA: Diagnosis present

## 2024-08-17 LAB — CBC WITH DIFFERENTIAL (CANCER CENTER ONLY)
Abs Immature Granulocytes: 0 K/uL (ref 0.00–0.07)
Basophils Absolute: 0 K/uL (ref 0.0–0.1)
Basophils Relative: 1 %
Eosinophils Absolute: 0 K/uL (ref 0.0–0.5)
Eosinophils Relative: 0 %
HCT: 27.5 % — ABNORMAL LOW (ref 36.0–46.0)
Hemoglobin: 9.7 g/dL — ABNORMAL LOW (ref 12.0–15.0)
Immature Granulocytes: 0 %
Lymphocytes Relative: 42 %
Lymphs Abs: 0.6 K/uL — ABNORMAL LOW (ref 0.7–4.0)
MCH: 33.2 pg (ref 26.0–34.0)
MCHC: 35.3 g/dL (ref 30.0–36.0)
MCV: 94.2 fL (ref 80.0–100.0)
Monocytes Absolute: 0.2 K/uL (ref 0.1–1.0)
Monocytes Relative: 11 %
Neutro Abs: 0.7 K/uL — ABNORMAL LOW (ref 1.7–7.7)
Neutrophils Relative %: 46 %
Platelet Count: 72 K/uL — ABNORMAL LOW (ref 150–400)
RBC: 2.92 MIL/uL — ABNORMAL LOW (ref 3.87–5.11)
RDW: 13.8 % (ref 11.5–15.5)
WBC Count: 1.5 K/uL — ABNORMAL LOW (ref 4.0–10.5)
nRBC: 0 % (ref 0.0–0.2)

## 2024-08-17 LAB — CMP (CANCER CENTER ONLY)
ALT: 23 U/L (ref 0–44)
AST: 27 U/L (ref 15–41)
Albumin: 4.1 g/dL (ref 3.5–5.0)
Alkaline Phosphatase: 112 U/L (ref 38–126)
Anion gap: 11 (ref 5–15)
BUN: 15 mg/dL (ref 6–20)
CO2: 24 mmol/L (ref 22–32)
Calcium: 9.1 mg/dL (ref 8.9–10.3)
Chloride: 105 mmol/L (ref 98–111)
Creatinine: 0.58 mg/dL (ref 0.44–1.00)
GFR, Estimated: >60 mL/min
Glucose, Bld: 84 mg/dL (ref 70–99)
Potassium: 3.9 mmol/L (ref 3.5–5.1)
Sodium: 139 mmol/L (ref 135–145)
Total Bilirubin: <0.2 mg/dL (ref 0.0–1.2)
Total Protein: 6.8 g/dL (ref 6.5–8.1)

## 2024-08-17 MED ORDER — SODIUM CHLORIDE 0.9 % IV SOLN
420.0000 mg | Freq: Once | INTRAVENOUS | Status: AC
  Administered 2024-08-17: 420 mg via INTRAVENOUS
  Filled 2024-08-17: qty 14

## 2024-08-17 MED ORDER — ACETAMINOPHEN 325 MG PO TABS
650.0000 mg | ORAL_TABLET | Freq: Once | ORAL | Status: AC
  Administered 2024-08-17: 650 mg via ORAL
  Filled 2024-08-17: qty 2

## 2024-08-17 MED ORDER — TRASTUZUMAB-ANNS CHEMO 150 MG IV SOLR
6.0000 mg/kg | Freq: Once | INTRAVENOUS | Status: AC
  Administered 2024-08-17: 336 mg via INTRAVENOUS
  Filled 2024-08-17: qty 16

## 2024-08-17 MED ORDER — SODIUM CHLORIDE 0.9 % IV SOLN: INTRAVENOUS | Status: DC

## 2024-08-17 MED ORDER — DIPHENHYDRAMINE HCL 25 MG PO CAPS
50.0000 mg | ORAL_CAPSULE | Freq: Once | ORAL | Status: AC
  Administered 2024-08-17: 50 mg via ORAL
  Filled 2024-08-17: qty 2

## 2024-08-17 NOTE — Progress Notes (Signed)
 Pt declined to be observed for 30 minutes post Perjeta  infusion. Pt tolerated Tx well w/out incident. VSS at discharge.  Ambulatory to lobby.

## 2024-08-17 NOTE — Progress Notes (Signed)
 " [facility]  [phone] [phone]    ID: [name] DOB: 02/01/1966  MR#: [id number]  RDW#:[id number]  Patient Care Team: Katheen Roselie Rockford, NP as PCP - General (Internal Medicine) Kate Lonni CROME, MD as PCP - Cardiology (Cardiology) Ebbie Cough, MD as Consulting Physician (General Surgery) Tobie Baptist, MD as Consulting Physician (Ophthalmology) Legrand Victory CROME MOULD, MD as Consulting Physician (Gastroenterology) Abigail Maude POUR (Optometry) Loretha Ash, MD as Consulting Physician (Hematology and Oncology) Izell Domino, MD as Attending Physician (Radiation Oncology)  CHIEF COMPLAINT: 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  INTERVAL HISTORY:  History of Present Illness  Paige Foster is a 59 year old female with metastatic breast cancer who presents for oncology follow-up due to chemotherapy-induced neutropenia and fatigue.  She is currently on her scheduled week off from Ibrance  and was due to restart therapy on February 2nd. She reports significant fatigue and weakness, describing the medication as causing substantial tiredness, but denies other adverse effects. Despite these symptoms, she remains functional and continues to work. Sleep quality has improved compared to prior visits.  She denies bone pain, dyspnea, peripheral edema, changes in bowel habits, or urinary symptoms. Appetite is described as diminished but adequate.  Rest of the pertinent 10 point ROS reviewed and neg.  HISTORY OF CURRENT ILLNESS: From the original intake note:  Paige Foster has a prior history of left breast cancer, dating back to 2004. At that time she underwent a left mastectomy for stage 0 (noninvasive) breast cancer, with transverse rectus abdominis (TRAM) flap construction under Dr. Marcus. She also underwent a right breast reduction. She took tamoxifen for  three years.  More recently she underwent bilateral diagnostic mammography with tomography and left breast ultrasonography at [facility] on 01/03/2018 showing: Breast Density Category B. There is an oval fat containing lesion in the left breast upper outer quadrant posterior depth. No other significant masses, calcifications, or other findings are seen in either breast. Sonographically, there is a 1.5 cm lesion in the left breast upper outer quadrant posterior depth. This lesion is of mixed echogenicity. This correlates as palpated and with mammography findings. Follow up was recommended.  Close follow-up was suggested.  She then presented with a non-tender mass in the left reconstructed breast on 08/30/2018. On physical exam, there is a hard palpable lump measuring 2.0 cm in the upper outer left reconstructed breast 10 cm from the expected location of a nipple. Sonography over this area demonstrates a 1.8 cm x 1.7 cm x 1.4 cm mass in the left breast at 2 o'clock posterior depth 10 cm from the nipple. This mass is of mixed echogenicity. This abnormality is increased in size and correlates as palpated and with prior mammography findings. Color flow imaging demonstrates that there is vascularity present. Elastography imaging assessment is intermediate. No significant abnormalities were seen sonographically in the left axilla.    Accordingly on 08/30/2018 she proceeded to biopsy of the left breast mass in question. The pathology from this procedure showed ([id number]): invasive ductal carcinoma, grade III. Prognostic indicators significant for: estrogen receptor, 100% positive with strong staining intensity and progesterone receptor, 0% negative. Proliferation marker Ki67 at 15%. HER2 positive (3+) by immunohistochemistry.  The patient's subsequent history is as detailed below.   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    Port-A-Cath in place 09/28/2018   Recurrent breast cancer, left [facility]) oncologist-- dr layla    dx 2004, noninvasive Stage 0 ----s/p left mastectomy w/ tram flap construction (and right breast reduction), taken Tamoxifen for 3 yrs;   08-30-2018 recurrent left cancer , Grade III,  cT1c,  ER positive, PR negative, HER-2 positive, invasive ductal carcinoma-- neoadjuvant chemo to start 09-26-2018   Renal artery stenosis    mild right external renal artery stenosis per duplex in epic 08-09-2013   Wears glasses     PAST SURGICAL HISTORY: Past Surgical History:  Procedure Laterality Date   BREAST LUMPECTOMY WITH RADIOACTIVE SEED LOCALIZATION Left 02/20/2019   Procedure: LEFT BREAST LUMPECTOMY WITH RADIOACTIVE SEED LOCALIZATION;  Surgeon: Ebbie Cough, MD;  Location: [facility];  Service: General;  Laterality: Left;   BREAST SURGERY Left    Transflap   COLONOSCOPY     COLONOSCOPY     IR IMAGING GUIDED PORT INSERTION  11/25/2023   LEFT HEART CATH AND CORONARY ANGIOGRAPHY N/A 12/16/2023   Procedure: LEFT HEART CATH AND CORONARY ANGIOGRAPHY;  Surgeon: Jordan, Peter M, MD;  Location: [facility] INVASIVE CV LAB;  Service: Cardiovascular;  Laterality: N/A;   MASTECTOMY Left 2004   w/  TRAM flap construction and right breast augmentation with abdominoplasy   PARS PLANA VITRECTOMY Left 12/18/2018   Procedure: PARS PLANA VITRECTOMY WITH 25 GAUGE, ENDOLASER;  Surgeon: Tobie Baptist, MD;  Location: [facility] OR;  Service: Ophthalmology;  Laterality: Left;   PORTACATH PLACEMENT N/A 09/25/2018   Procedure: INSERTION PORT-A-CATH WITH ULTRASOUND;  Surgeon: Ebbie Cough, MD;  Location: [facility] ORS;  Service: General;  Laterality: N/A;   TUBAL LIGATION Bilateral yrs ago    FAMILY HISTORY: Family History  Problem Relation Age of Onset   Diabetes Mother    Hypertension Mother    Kidney disease Father    Colon cancer  Neg Hx    Colon polyps Neg Hx    Gallbladder disease Neg Hx    Heart disease Neg Hx    Esophageal cancer Neg Hx    Stomach cancer Neg Hx    Rectal cancer Neg Hx   Clair's father died from unknown causes in his early 89's. Patients' mother died from diabetes complications at age 79. The patient has 1 sister. Patient denies anyone in her family having breast, ovarian, prostate, or pancreatic cancer.    GYNECOLOGIC HISTORY:  Patient's last menstrual period was 06/08/2007. 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 Her husband, Zachary, works at [employer]. Jetty has two children, Fonda and Alm. Fonda lives with her, is 55, and it attending [employer] for a computer based degree. Alm lives with her, is 56, and recently graduated from [employer] with a degree in Pension Scheme Manager.  He works for [employer]. Valjean has no grandchildren. She attends the [employer].   ADVANCED DIRECTIVES: In the absence of any documents to the contrary her husband, Zachary, is automatically her healthcare power of attorney     HEALTH MAINTENANCE: Social History   Tobacco Use   Smoking status: Never    Passive exposure: Never   Smokeless tobacco: Never  Vaping Use   Vaping status: Never Used  Substance Use Topics   Alcohol use: Not Currently    Alcohol/week: 0.0 standard drinks of alcohol    Comment: Occassionally   Drug use: No    Colonoscopy: April 2022, danis  PAP: January 2022, N[facility]  Bone density:  2017; -1.6, osteopenic   Allergies  Allergen Reactions   Bee Pollen Itching    Watery eyes and nose running    Current Outpatient Medications  Medication Sig Dispense Refill   ALPRAZolam  (XANAX ) 0.5 MG tablet Take 1 tablet (0.5 mg total) by mouth at bedtime as needed for anxiety. 30 tablet 0   amLODipine  (NORVASC )  10 MG tablet Take 1 tablet by mouth daily. (Patient not taking: Reported on 07/13/2024) 90 tablet 2   anastrozole  (ARIMIDEX ) 1 MG tablet Take 1 tablet (1 mg total) by mouth daily. 90 tablet 3   brimonidine  (ALPHAGAN ) 0.2 % ophthalmic solution Place 1 drop into both eyes 3 (three) times daily.     dexamethasone  (DECADRON ) 4 MG tablet Take 2 tabs by mouth 2 times daily starting day before chemo. Then take 2 tabs daily for 2 days starting day after chemo. Take with food. (Patient not taking: Reported on 07/13/2024) 30 tablet 1   dorzolamide -timolol  (COSOPT ) 2-0.5 % ophthalmic solution 1 drop 2 (two) times daily.     lidocaine -prilocaine  (EMLA ) cream Apply to affected area 1 hour before access prn 30 g 3   losartan  (COZAAR ) 50 MG tablet TAKE 1 TABLET(50 MG) BY MOUTH DAILY (Patient not taking: Reported on 07/13/2024) 90 tablet 3   omeprazole  (PRILOSEC) 20 MG capsule Take 1 capsule (20 mg total) by mouth daily. 90 capsule 1   ondansetron  (ZOFRAN ) 8 MG tablet Take 1 tablet (8 mg total) by mouth every 8 (eight) hours as needed for nausea or vomiting. 20 tablet 3   palbociclib  (IBRANCE ) 125 MG tablet Take 1 tablet (125 mg total) by mouth daily. Take for 21 days on, 7 days off, repeat every 28 days. 21 tablet 3   pertuzumab  (PERJETA ) 420 MG/14ML SOLN Inject 420 mg into the vein once.     prochlorperazine  (COMPAZINE ) 10 MG tablet Take 1 tablet (10 mg total) by mouth every 6 (six) hours as needed for nausea or vomiting. (Patient not taking: Reported on 07/13/2024) 30 tablet 1   rosuvastatin  (CRESTOR ) 5 MG tablet Take 1 tablet (5 mg total) by mouth daily. 90 tablet 3   trastuzumab -anns 336 mg in sodium chloride  0.9 % 250 mL Inject 336 mg into the vein once.     zoledronic  acid (ZOMETA ) 4 MG/5ML injection Inject 4 mg into the vein once.     zolpidem  (AMBIEN ) 5 MG tablet Take 1 tablet (5 mg total) by mouth at bedtime as needed for sleep. 10 tablet 0   No current facility-administered medications for this visit.      OBJECTIVE: African-American woman who appears stated age  Vitals:   08/17/24 1312  BP: (!) 108/57  Pulse: 67  Resp: 16  Temp: 97.9 F (36.6 C)  SpO2: 100%        Body mass index is 20.98 kg/m.   Wt Readings from Last 3 Encounters:  08/17/24 122 lb 3.2 oz (55.4 kg)  07/23/24 120 lb 12.8 oz (54.8 kg)  07/02/24 122 lb 8 oz (55.6 kg)   Physical Exam Constitutional:      Appearance: Normal appearance.  Cardiovascular:     Rate and Rhythm: Normal rate and regular rhythm.     Pulses: Normal pulses.  Heart sounds: Normal heart sounds.  Pulmonary:     Effort: Pulmonary effort is normal.     Breath sounds: Normal breath sounds.  Musculoskeletal:        General: No swelling.     Cervical back: Normal range of motion and neck supple. No rigidity.  Lymphadenopathy:     Cervical: No cervical adenopathy.  Skin:    General: Skin is warm and dry.  Neurological:     General: No focal deficit present.     Mental Status: She is alert.       LAB RESULTS:  CMP     Component Value Date/Time   NA 139 08/17/2024 1222   NA 140 10/12/2017 0950   K 3.9 08/17/2024 1222   CL 105 08/17/2024 1222   CO2 24 08/17/2024 1222   GLUCOSE 84 08/17/2024 1222   BUN 15 08/17/2024 1222   BUN 10 10/12/2017 0950   CREATININE 0.58 08/17/2024 1222   CALCIUM  9.1 08/17/2024 1222   PROT 6.8 08/17/2024 1222   PROT 7.5 10/12/2017 0950   ALBUMIN 4.1 08/17/2024 1222   ALBUMIN 4.3 10/12/2017 0950   AST 27 08/17/2024 1222   ALT 23 08/17/2024 1222   ALKPHOS 112 08/17/2024 1222   BILITOT <0.2 08/17/2024 1222   GFRNONAA >60 08/17/2024 1222   GFRAA >60 12/06/2019 1441   GFRAA >60 09/07/2018 1455   Lab Results  Component Value Date   WBC 1.5 (L) 08/17/2024   NEUTROABS 0.7 (L) 08/17/2024   HGB 9.7 (L) 08/17/2024   HCT 27.5 (L) 08/17/2024   MCV 94.2 08/17/2024   PLT 72 (L) 08/17/2024    Lab Results  Component Value Date   LABCA2 <4 08/30/2007    No components found for:  [id number]  No results for input(s): INR in the last 168 hours.   Lab Results  Component Value Date   LABCA2 <4 08/30/2007    No results found for: CAN199  No results found for: CAN125  Lab Results  Component Value Date   CAN153 23.9 10/27/2023    Lab Results  Component Value Date   CA2729 27.9 10/27/2023    No components found for: HGQUANT  No results found for: CEA1, CEA / No results found for: CEA1, CEA  No results found for: AFPTUMOR  No results found for: CHROMOGRNA  No results found for: TOTALPROTELP, ALBUMINELP, A1GS, A2GS, BETS, BETA2SER, GAMS, MSPIKE, SPEI (this displays SPEP labs)  No results found for: KPAFRELGTCHN, LAMBDASER, KAPLAMBRATIO (kappa/lambda light chains)  No results found for: HGBA, HGBA2QUANT, HGBFQUANT, HGBSQUAN (Hemoglobinopathy evaluation)   Lab Results  Component Value Date   LDH 169 08/30/2007    Lab Results  Component Value Date   IRON 96 07/11/2023   TIBC 382 07/11/2023   IRONPCTSAT 25 07/11/2023   (Iron and TIBC)  Lab Results  Component Value Date   FERRITIN 261 07/11/2023    Urinalysis    Component Value Date/Time   COLORURINE STRAW (A) 12/15/2023 0809   APPEARANCEUR CLEAR 12/15/2023 0809   LABSPEC 1.010 12/15/2023 0809   PHURINE 5.0 12/15/2023 0809   GLUCOSEU NEGATIVE 12/15/2023 0809   HGBUR NEGATIVE 12/15/2023 0809   HGBUR large 04/09/2008 1548   BILIRUBINUR NEGATIVE 12/15/2023 0809   KETONESUR NEGATIVE 12/15/2023 0809   PROTEINUR NEGATIVE 12/15/2023 0809   UROBILINOGEN 0.2 04/09/2008 1548   NITRITE NEGATIVE 12/15/2023 0809   LEUKOCYTESUR NEGATIVE 12/15/2023 0809    STUDIES:  No results found.       ELIGIBLE FOR AVAILABLE  RESEARCH PROTOCOL: No  ASSESSMENT: 59 y.o. [place], [place] woman  (1) history of left-sided ductal carcinoma in situ 2004  (a) s/p left mastectomy with TRAM reconstruction  (b) status post tamoxifen x3 years  (2) left  breast upper outer quadrant biopsy 08/30/2018 shows a clinical T1c N0 invasive ductal carcinoma, grade 3, estrogen receptor strongly positive, progesterone receptor negative, with HER-2 amplification, and and MIB-1 of 15%.   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    (b) chemotherapy discontinued after 4 cycles because of intercurrent eye surgery  (c) continued trastuzumab  and pertuzumab  to complete a year (last dose 09/06/2019)  (d) echocardiogram on 01/23/2019 that shows well preserved EF of 60-65%  (e) echocardiogram on 04/23/2019 shows EF of 60-65%  (f) echocardiogram 08/02/2019 shows an ejection fraction in the 60-65% range  (4) left lumpectomy 02/20/2019 showed a residual  ypT1c NX invasive ductal carcinoma, grade 2 with negative margins.    (5) adjuvant radiation: Radiation Treatment Dates: 04/12/2019 through 05/28/2019 Site Technique Total Dose (Gy) Dose per Fx (Gy) Completed Fx Beam Energies  Breast: CW_Lt 3D 50.4/50.4 1.8 28/28 6X, 10X  Breast: CW_Lt_SCV_PAB 3D 50.4/50.4 1.8 28/28 6X, 10X  Breast: CW_Lt_Bst Electron 10/10 2 5/5 6X, 10X   (6) anastrozole  started 06/26/2019  (a) [facility] at [facility] 12/24/2015 found a T score of -1.6 DEXA scan from mass June 2023 showed T score of -1.4 at L1-L2  (7) genetics testing 02/03/2019 through the Common Hereditary Cancers Panel offered by [facility] found no deleterious mutations in APC, ATM, AXIN2, BARD1, BMPR1A, BRCA1, BRCA2, BRIP1, CDH1, CDKN2A (p14ARF), CDKN2A (p16INK4a), CKD4, CHEK2, CTNNA1, DICER1, EPCAM (Deletion/duplication testing only), GREM1 (promoter region deletion/duplication testing only), KIT, MEN1, MLH1, MSH2,  MSH3, MSH6, MUTYH, NBN, NF1, NHTL1, PALB2, PDGFRA, PMS2, POLD1, POLE, PTEN, RAD50, RAD51C, RAD51D, RNF43, SDHB, SDHC, SDHD, SMAD4, SMARCA4. STK11, TP53, TSC1, TSC2, and VHL.  The following genes were evaluated for sequence changes only: SDHA and HOXB13 c.251G>A variant only.  (8) She had a CXR for unintentional weight loss,  this showed possible left lung pleural based mass and posterior right eighth rib met lesion. CT showed 3.8 x 5.5 x 5 cm left anterior chest wall mass along the mid axillary line with rib involvement and destruction and low-attenuation changes of the center indicating necrotic mass correlates with a neoplastic lesion likely metastatic disease. Liver is enlarged, inhomogeneous with multiple enhancing lesions throughout the right and left lobe of the liver consistent with extensive numerous metastatic liver lesions. Liver appears completely replaced by metastatic disease. Lytic metastatic bone lesions involving the right anterior superior iliac crest, left iliac bone, right inferior pubic ramus, left iliac bone, and L4 vertebral body. Suggestive of lytic bone changes involving the upper thoracic vertebral bodies T1-T2.  PLAN:  Assessment & Plan Metastatic HER2-positive breast cancer She is on HP, Ibrance  and anastrozole  based on PATINA trial. - Continue antibody infusion therapy. - Reviewed therapy continuation if disease stable or regresses. - Hold Ibrance  for an additional week for hematologic recovery. - Ordered repeat labs next Friday to assess blood count recovery. - Instructed to resume Ibrance  on February 2nd if blood counts recover; consider dose reduction if cytopenias persist. - Provided work note. - Directed to proceed  with scheduled scan on Monday, with contingency to reschedule if severe weather occurs. - Directed to check out for next appointments and proceed to treatment.  Time spent: 30 min  Amber Stalls, MD  Medical Oncology and Hematology [facility] [address] [place], [place] [address] Tel. [phone]    Fax. [phone]  *Total Encounter Time as defined by the Centers for Medicare and Medicaid Services includes, in addition to the face-to-face time of a patient visit (documented in the note above) non-face-to-face time: obtaining and reviewing outside history, ordering and reviewing medications, tests or procedures, care coordination (communications with other health care professionals or caregivers) and documentation in the medical record. "

## 2024-08-17 NOTE — Patient Instructions (Signed)
 [facility] - A DEPT OF [facility]  Discharge Instructions: Thank you for choosing [facility] to provide your oncology and hematology care.   If you have a lab appointment with the Cancer Center, please go directly to the Cancer Center and check in at the registration area.   Wear comfortable clothing and clothing appropriate for easy access to any Portacath or PICC line.   We strive to give you quality time with your provider. You may need to reschedule your appointment if you arrive late (15 or more minutes).  Arriving late affects you and other patients whose appointments are after yours.  Also, if you miss three or more appointments without notifying the office, you may be dismissed from the clinic at the provider's discretion.      For prescription refill requests, have your pharmacy contact our office and allow 72 hours for refills to be completed.    Today you received the following chemotherapy and/or immunotherapy agents: Kanjinti/Perjeta      To help prevent nausea and vomiting after your treatment, we encourage you to take your nausea medication as directed.  BELOW ARE SYMPTOMS THAT SHOULD BE REPORTED IMMEDIATELY: *FEVER GREATER THAN 100.4 F (38 C) OR HIGHER *CHILLS OR SWEATING *NAUSEA AND VOMITING THAT IS NOT CONTROLLED WITH YOUR NAUSEA MEDICATION *UNUSUAL SHORTNESS OF BREATH *UNUSUAL BRUISING OR BLEEDING *URINARY PROBLEMS (pain or burning when urinating, or frequent urination) *BOWEL PROBLEMS (unusual diarrhea, constipation, pain near the anus) TENDERNESS IN MOUTH AND THROAT WITH OR WITHOUT PRESENCE OF ULCERS (sore throat, sores in mouth, or a toothache) UNUSUAL RASH, SWELLING OR PAIN  UNUSUAL VAGINAL DISCHARGE OR ITCHING   Items with * indicate a potential emergency and should be followed up as soon as possible or go to the Emergency Department if any problems should occur.  Please show the CHEMOTHERAPY ALERT CARD or  IMMUNOTHERAPY ALERT CARD at check-in to the Emergency Department and triage nurse.  Should you have questions after your visit or need to cancel or reschedule your appointment, please contact [facility] - A DEPT OF [facility]  Dept: [phone]  and follow the prompts.  Office hours are 8:00 a.m. to 4:30 p.m. Monday - Friday. Please note that voicemails left after 4:00 p.m. may not be returned until the following business day.  We are closed weekends and major holidays. You have access to a nurse at all times for urgent questions. Please call the main number to the clinic Dept: [phone] and follow the prompts.   For any non-urgent questions, you may also contact your provider using MyChart. We now offer e-Visits for anyone 91 and older to request care online for non-urgent symptoms. For details visit [email or web address].   Also download the MyChart app! Go to the app store, search "MyChart", open the app, select [place], and log in with your MyChart username and password.

## 2024-08-20 ENCOUNTER — Ambulatory Visit (HOSPITAL_COMMUNITY)
Admission: RE | Admit: 2024-08-20 | Discharge: 2024-08-20 | Disposition: A | Discharge status: Home / Self Care | Source: Ambulatory Visit | Attending: Hematology and Oncology | Admitting: Hematology and Oncology

## 2024-08-20 DIAGNOSIS — Z170 Estrogen receptor positive status [ER+]: Secondary | ICD-10-CM | POA: Insufficient documentation

## 2024-08-20 DIAGNOSIS — C50412 Malignant neoplasm of upper-outer quadrant of left female breast: Secondary | ICD-10-CM | POA: Diagnosis present

## 2024-08-20 DIAGNOSIS — C50912 Malignant neoplasm of unspecified site of left female breast: Secondary | ICD-10-CM | POA: Insufficient documentation

## 2024-08-20 MED ORDER — HEPARIN SOD (PORK) LOCK FLUSH 100 UNIT/ML IV SOLN
500.0000 [IU] | Freq: Once | INTRAVENOUS | Status: AC
  Administered 2024-08-20: 500 [IU] via INTRAVENOUS

## 2024-08-20 MED ORDER — HEPARIN SOD (PORK) LOCK FLUSH 100 UNIT/ML IV SOLN
INTRAVENOUS | Status: AC
  Filled 2024-08-20: qty 5

## 2024-08-20 MED ORDER — IOHEXOL 300 MG/ML  SOLN
100.0000 mL | Freq: Once | INTRAMUSCULAR | Status: AC | PRN
  Administered 2024-08-20: 100 mL via INTRAVENOUS

## 2024-08-22 ENCOUNTER — Other Ambulatory Visit: Payer: Self-pay

## 2024-08-22 DIAGNOSIS — C50412 Malignant neoplasm of upper-outer quadrant of left female breast: Secondary | ICD-10-CM

## 2024-08-24 ENCOUNTER — Other Ambulatory Visit: Payer: Self-pay | Admitting: Hematology and Oncology

## 2024-08-24 ENCOUNTER — Inpatient Hospital Stay

## 2024-08-24 ENCOUNTER — Ambulatory Visit: Payer: Self-pay | Admitting: Hematology and Oncology

## 2024-08-24 DIAGNOSIS — C50412 Malignant neoplasm of upper-outer quadrant of left female breast: Secondary | ICD-10-CM

## 2024-08-24 LAB — CMP (CANCER CENTER ONLY)
ALT: 29 U/L (ref 0–44)
AST: 31 U/L (ref 15–41)
Albumin: 4.1 g/dL (ref 3.5–5.0)
Alkaline Phosphatase: 119 U/L (ref 38–126)
Anion gap: 13 (ref 5–15)
BUN: 12 mg/dL (ref 6–20)
CO2: 23 mmol/L (ref 22–32)
Calcium: 9.3 mg/dL (ref 8.9–10.3)
Chloride: 104 mmol/L (ref 98–111)
Creatinine: 0.46 mg/dL (ref 0.44–1.00)
GFR, Estimated: >60 mL/min
Glucose, Bld: 90 mg/dL (ref 70–99)
Potassium: 3.5 mmol/L (ref 3.5–5.1)
Sodium: 139 mmol/L (ref 135–145)
Total Bilirubin: 0.2 mg/dL (ref 0.0–1.2)
Total Protein: 6.9 g/dL (ref 6.5–8.1)

## 2024-08-24 LAB — CBC WITH DIFFERENTIAL (CANCER CENTER ONLY)
Abs Immature Granulocytes: 0 10*3/uL (ref 0.00–0.07)
Basophils Absolute: 0 10*3/uL (ref 0.0–0.1)
Basophils Relative: 1 %
Eosinophils Absolute: 0 10*3/uL (ref 0.0–0.5)
Eosinophils Relative: 1 %
HCT: 28.3 % — ABNORMAL LOW (ref 36.0–46.0)
Hemoglobin: 9.7 g/dL — ABNORMAL LOW (ref 12.0–15.0)
Immature Granulocytes: 0 %
Lymphocytes Relative: 35 %
Lymphs Abs: 0.7 10*3/uL (ref 0.7–4.0)
MCH: 32.6 pg (ref 26.0–34.0)
MCHC: 34.3 g/dL (ref 30.0–36.0)
MCV: 95 fL (ref 80.0–100.0)
Monocytes Absolute: 0.5 10*3/uL (ref 0.1–1.0)
Monocytes Relative: 24 %
Neutro Abs: 0.8 10*3/uL — ABNORMAL LOW (ref 1.7–7.7)
Neutrophils Relative %: 39 %
Platelet Count: 185 10*3/uL (ref 150–400)
RBC: 2.98 MIL/uL — ABNORMAL LOW (ref 3.87–5.11)
RDW: 14.6 % (ref 11.5–15.5)
WBC Count: 2.1 10*3/uL — ABNORMAL LOW (ref 4.0–10.5)
nRBC: 0 % (ref 0.0–0.2)

## 2024-08-24 MED ORDER — PALBOCICLIB 100 MG PO CAPS: 100.0000 mg | ORAL_CAPSULE | Freq: Every day | ORAL | 1 refills | Status: AC

## 2024-08-27 ENCOUNTER — Other Ambulatory Visit (HOSPITAL_COMMUNITY): Payer: Self-pay

## 2024-08-27 ENCOUNTER — Telehealth: Payer: Self-pay

## 2024-08-28 ENCOUNTER — Other Ambulatory Visit: Payer: Self-pay

## 2024-09-07 ENCOUNTER — Inpatient Hospital Stay: Admitting: Hematology and Oncology

## 2024-09-07 ENCOUNTER — Inpatient Hospital Stay
